# Patient Record
Sex: Female | Born: 1950 | Race: White | Hispanic: No | Marital: Married | Smoking: Former smoker
Health system: Southern US, Community
[De-identification: ages and names within clinical notes are randomized; demographics above are authoritative.]

## PROBLEM LIST (undated history)

## (undated) DIAGNOSIS — I639 Cerebral infarction, unspecified: Secondary | ICD-10-CM

## (undated) DIAGNOSIS — I10 Essential (primary) hypertension: Secondary | ICD-10-CM

## (undated) DIAGNOSIS — C801 Malignant (primary) neoplasm, unspecified: Secondary | ICD-10-CM

## (undated) DIAGNOSIS — E119 Type 2 diabetes mellitus without complications: Secondary | ICD-10-CM

## (undated) DIAGNOSIS — E785 Hyperlipidemia, unspecified: Secondary | ICD-10-CM

## (undated) DIAGNOSIS — D649 Anemia, unspecified: Secondary | ICD-10-CM

## (undated) HISTORY — DX: Type 2 diabetes mellitus without complications: E11.9

## (undated) HISTORY — DX: Anemia, unspecified: D64.9

## (undated) HISTORY — DX: Essential (primary) hypertension: I10

## (undated) HISTORY — PX: TONSILLECTOMY: SUR1361

## (undated) HISTORY — DX: Hyperlipidemia, unspecified: E78.5

## (undated) HISTORY — DX: Malignant (primary) neoplasm, unspecified: C80.1

---

## 2019-07-14 DIAGNOSIS — I779 Disorder of arteries and arterioles, unspecified: Secondary | ICD-10-CM

## 2019-07-14 HISTORY — DX: Disorder of arteries and arterioles, unspecified: I77.9

## 2019-08-12 ENCOUNTER — Other Ambulatory Visit (HOSPITAL_COMMUNITY): Payer: Medicare Other

## 2019-08-12 ENCOUNTER — Emergency Department (HOSPITAL_COMMUNITY): Payer: Medicare Other

## 2019-08-12 ENCOUNTER — Inpatient Hospital Stay (HOSPITAL_COMMUNITY): Payer: Medicare Other

## 2019-08-12 ENCOUNTER — Encounter (HOSPITAL_COMMUNITY): Payer: Medicare Other

## 2019-08-12 ENCOUNTER — Other Ambulatory Visit: Payer: Self-pay

## 2019-08-12 ENCOUNTER — Encounter (HOSPITAL_COMMUNITY): Payer: Self-pay | Admitting: Student

## 2019-08-12 ENCOUNTER — Inpatient Hospital Stay (HOSPITAL_COMMUNITY)
Admission: EM | Admit: 2019-08-12 | Discharge: 2019-08-14 | DRG: 065 | Discharge status: Against Medical Advice | Payer: Medicare Other | Attending: Family Medicine | Admitting: Family Medicine

## 2019-08-12 DIAGNOSIS — I63512 Cerebral infarction due to unspecified occlusion or stenosis of left middle cerebral artery: Secondary | ICD-10-CM | POA: Diagnosis not present

## 2019-08-12 DIAGNOSIS — Z743 Need for continuous supervision: Secondary | ICD-10-CM | POA: Diagnosis not present

## 2019-08-12 DIAGNOSIS — F17210 Nicotine dependence, cigarettes, uncomplicated: Secondary | ICD-10-CM | POA: Diagnosis present

## 2019-08-12 DIAGNOSIS — I6389 Other cerebral infarction: Secondary | ICD-10-CM | POA: Diagnosis not present

## 2019-08-12 DIAGNOSIS — G8191 Hemiplegia, unspecified affecting right dominant side: Secondary | ICD-10-CM | POA: Diagnosis not present

## 2019-08-12 DIAGNOSIS — E1149 Type 2 diabetes mellitus with other diabetic neurological complication: Secondary | ICD-10-CM

## 2019-08-12 DIAGNOSIS — R29707 NIHSS score 7: Secondary | ICD-10-CM | POA: Diagnosis not present

## 2019-08-12 DIAGNOSIS — I10 Essential (primary) hypertension: Secondary | ICD-10-CM | POA: Diagnosis not present

## 2019-08-12 DIAGNOSIS — R410 Disorientation, unspecified: Secondary | ICD-10-CM | POA: Diagnosis not present

## 2019-08-12 DIAGNOSIS — R1310 Dysphagia, unspecified: Secondary | ICD-10-CM | POA: Diagnosis present

## 2019-08-12 DIAGNOSIS — R29708 NIHSS score 8: Secondary | ICD-10-CM | POA: Diagnosis not present

## 2019-08-12 DIAGNOSIS — I63232 Cerebral infarction due to unspecified occlusion or stenosis of left carotid arteries: Secondary | ICD-10-CM | POA: Diagnosis not present

## 2019-08-12 DIAGNOSIS — I6529 Occlusion and stenosis of unspecified carotid artery: Secondary | ICD-10-CM | POA: Diagnosis not present

## 2019-08-12 DIAGNOSIS — E1159 Type 2 diabetes mellitus with other circulatory complications: Secondary | ICD-10-CM | POA: Insufficient documentation

## 2019-08-12 DIAGNOSIS — R4701 Aphasia: Secondary | ICD-10-CM | POA: Diagnosis not present

## 2019-08-12 DIAGNOSIS — R29810 Facial weakness: Secondary | ICD-10-CM | POA: Diagnosis present

## 2019-08-12 DIAGNOSIS — F17200 Nicotine dependence, unspecified, uncomplicated: Secondary | ICD-10-CM | POA: Diagnosis not present

## 2019-08-12 DIAGNOSIS — I639 Cerebral infarction, unspecified: Secondary | ICD-10-CM | POA: Insufficient documentation

## 2019-08-12 DIAGNOSIS — I6523 Occlusion and stenosis of bilateral carotid arteries: Secondary | ICD-10-CM | POA: Diagnosis not present

## 2019-08-12 DIAGNOSIS — E785 Hyperlipidemia, unspecified: Secondary | ICD-10-CM | POA: Diagnosis present

## 2019-08-12 DIAGNOSIS — R404 Transient alteration of awareness: Secondary | ICD-10-CM | POA: Diagnosis not present

## 2019-08-12 DIAGNOSIS — R29818 Other symptoms and signs involving the nervous system: Secondary | ICD-10-CM | POA: Diagnosis not present

## 2019-08-12 DIAGNOSIS — R29704 NIHSS score 4: Secondary | ICD-10-CM | POA: Diagnosis not present

## 2019-08-12 DIAGNOSIS — I779 Disorder of arteries and arterioles, unspecified: Secondary | ICD-10-CM

## 2019-08-12 DIAGNOSIS — I6522 Occlusion and stenosis of left carotid artery: Secondary | ICD-10-CM | POA: Diagnosis not present

## 2019-08-12 DIAGNOSIS — I6602 Occlusion and stenosis of left middle cerebral artery: Secondary | ICD-10-CM | POA: Diagnosis not present

## 2019-08-12 DIAGNOSIS — Q239 Congenital malformation of aortic and mitral valves, unspecified: Secondary | ICD-10-CM

## 2019-08-12 DIAGNOSIS — Z03818 Encounter for observation for suspected exposure to other biological agents ruled out: Secondary | ICD-10-CM | POA: Diagnosis not present

## 2019-08-12 DIAGNOSIS — R29709 NIHSS score 9: Secondary | ICD-10-CM | POA: Diagnosis not present

## 2019-08-12 DIAGNOSIS — E119 Type 2 diabetes mellitus without complications: Secondary | ICD-10-CM

## 2019-08-12 DIAGNOSIS — E7800 Pure hypercholesterolemia, unspecified: Secondary | ICD-10-CM

## 2019-08-12 DIAGNOSIS — R000 Tachycardia, unspecified: Secondary | ICD-10-CM | POA: Diagnosis not present

## 2019-08-12 DIAGNOSIS — R7989 Other specified abnormal findings of blood chemistry: Secondary | ICD-10-CM | POA: Diagnosis present

## 2019-08-12 DIAGNOSIS — E669 Obesity, unspecified: Secondary | ICD-10-CM | POA: Diagnosis present

## 2019-08-12 DIAGNOSIS — Z20822 Contact with and (suspected) exposure to covid-19: Secondary | ICD-10-CM | POA: Diagnosis not present

## 2019-08-12 DIAGNOSIS — D751 Secondary polycythemia: Secondary | ICD-10-CM | POA: Diagnosis not present

## 2019-08-12 DIAGNOSIS — E1165 Type 2 diabetes mellitus with hyperglycemia: Secondary | ICD-10-CM | POA: Diagnosis present

## 2019-08-12 DIAGNOSIS — I160 Hypertensive urgency: Secondary | ICD-10-CM | POA: Diagnosis not present

## 2019-08-12 DIAGNOSIS — I6621 Occlusion and stenosis of right posterior cerebral artery: Secondary | ICD-10-CM | POA: Diagnosis not present

## 2019-08-12 DIAGNOSIS — I499 Cardiac arrhythmia, unspecified: Secondary | ICD-10-CM | POA: Diagnosis not present

## 2019-08-12 DIAGNOSIS — R414 Neurologic neglect syndrome: Secondary | ICD-10-CM | POA: Diagnosis not present

## 2019-08-12 LAB — I-STAT CHEM 8, ED
BUN: 11 mg/dL (ref 8–23)
Calcium, Ion: 1.12 mmol/L — ABNORMAL LOW (ref 1.15–1.40)
Chloride: 102 mmol/L (ref 98–111)
Creatinine, Ser: 0.4 mg/dL — ABNORMAL LOW (ref 0.44–1.00)
Glucose, Bld: 204 mg/dL — ABNORMAL HIGH (ref 70–99)
HCT: 53 % — ABNORMAL HIGH (ref 36.0–46.0)
Hemoglobin: 18 g/dL — ABNORMAL HIGH (ref 12.0–15.0)
Potassium: 4 mmol/L (ref 3.5–5.1)
Sodium: 137 mmol/L (ref 135–145)
TCO2: 21 mmol/L — ABNORMAL LOW (ref 22–32)

## 2019-08-12 LAB — COMPREHENSIVE METABOLIC PANEL
ALT: 28 U/L (ref 0–44)
AST: 26 U/L (ref 15–41)
Albumin: 4.3 g/dL (ref 3.5–5.0)
Alkaline Phosphatase: 80 U/L (ref 38–126)
Anion gap: 16 — ABNORMAL HIGH (ref 5–15)
BUN: 10 mg/dL (ref 8–23)
CO2: 20 mmol/L — ABNORMAL LOW (ref 22–32)
Calcium: 9.9 mg/dL (ref 8.9–10.3)
Chloride: 102 mmol/L (ref 98–111)
Creatinine, Ser: 0.65 mg/dL (ref 0.44–1.00)
GFR calc Af Amer: >60 mL/min (ref >60)
GFR calc non Af Amer: >60 mL/min (ref >60)
Glucose, Bld: 209 mg/dL — ABNORMAL HIGH (ref 70–99)
Potassium: 4.3 mmol/L (ref 3.5–5.1)
Sodium: 138 mmol/L (ref 135–145)
Total Bilirubin: 0.9 mg/dL (ref 0.3–1.2)
Total Protein: 7.1 g/dL (ref 6.5–8.1)

## 2019-08-12 LAB — APTT: aPTT: 31 seconds (ref 24–36)

## 2019-08-12 LAB — CBG MONITORING, ED: Glucose-Capillary: 209 mg/dL — ABNORMAL HIGH (ref 70–99)

## 2019-08-12 LAB — DIFFERENTIAL
Abs Immature Granulocytes: 0.03 10*3/uL (ref 0.00–0.07)
Basophils Absolute: 0 10*3/uL (ref 0.0–0.1)
Basophils Relative: 0 %
Eosinophils Absolute: 0.1 10*3/uL (ref 0.0–0.5)
Eosinophils Relative: 1 %
Immature Granulocytes: 0 %
Lymphocytes Relative: 18 %
Lymphs Abs: 2 10*3/uL (ref 0.7–4.0)
Monocytes Absolute: 0.8 10*3/uL (ref 0.1–1.0)
Monocytes Relative: 8 %
Neutro Abs: 7.7 10*3/uL (ref 1.7–7.7)
Neutrophils Relative %: 73 %

## 2019-08-12 LAB — CBC
HCT: 53.8 % — ABNORMAL HIGH (ref 36.0–46.0)
Hemoglobin: 17.8 g/dL — ABNORMAL HIGH (ref 12.0–15.0)
MCH: 29.4 pg (ref 26.0–34.0)
MCHC: 33.1 g/dL (ref 30.0–36.0)
MCV: 88.9 fL (ref 80.0–100.0)
Platelets: 221 10*3/uL (ref 150–400)
RBC: 6.05 MIL/uL — ABNORMAL HIGH (ref 3.87–5.11)
RDW: 14.4 % (ref 11.5–15.5)
WBC: 10.6 10*3/uL — ABNORMAL HIGH (ref 4.0–10.5)
nRBC: 0 % (ref 0.0–0.2)

## 2019-08-12 LAB — PROTIME-INR
INR: 1 (ref 0.8–1.2)
Prothrombin Time: 13.1 seconds (ref 11.4–15.2)

## 2019-08-12 LAB — SARS CORONAVIRUS 2 BY RT PCR (HOSPITAL ORDER, PERFORMED IN ~~LOC~~ HOSPITAL LAB): SARS Coronavirus 2: NEGATIVE

## 2019-08-12 LAB — HIV ANTIBODY (ROUTINE TESTING W REFLEX): HIV Screen 4th Generation wRfx: NONREACTIVE

## 2019-08-12 IMAGING — CT CT HEAD CODE STROKE
4 series · 15 of 47 positions shown, 17 images · non-contrast
Comparison: None.

CLINICAL DATA: Code stroke.  Acute confusion

EXAM:
CT HEAD WITHOUT CONTRAST
TECHNIQUE: Contiguous axial images were obtained from the base of the skull
through the vertex without intravenous contrast.

[Series 3: head 5.0 st · axial · 0.44mm/px · z∈[-96,+29]mm · 7 of 35 slices shown, 9 images]
[im 5/35  brain]
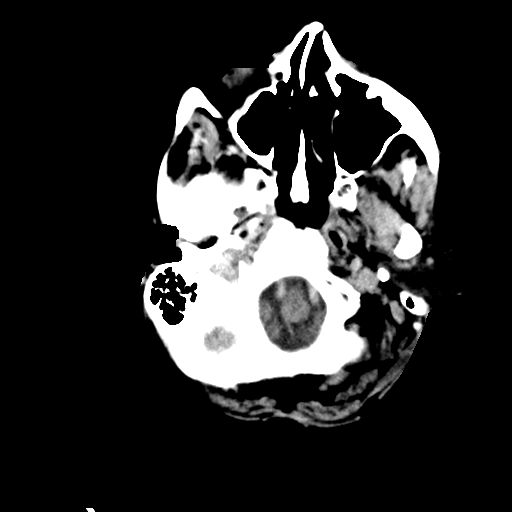
[im 5/35  bone]
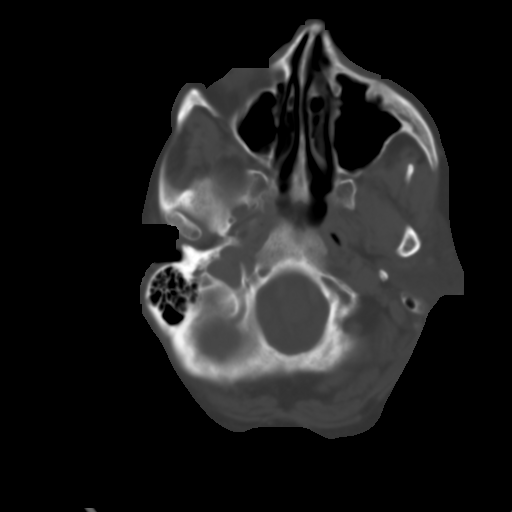
[im 9/35  brain]
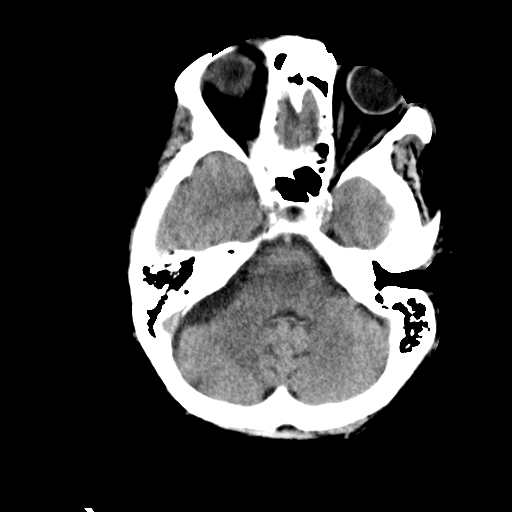
[im 13/35  brain]
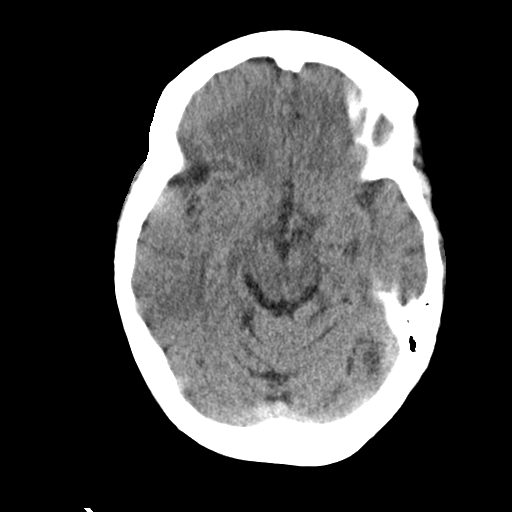
[im 18/35  brain]
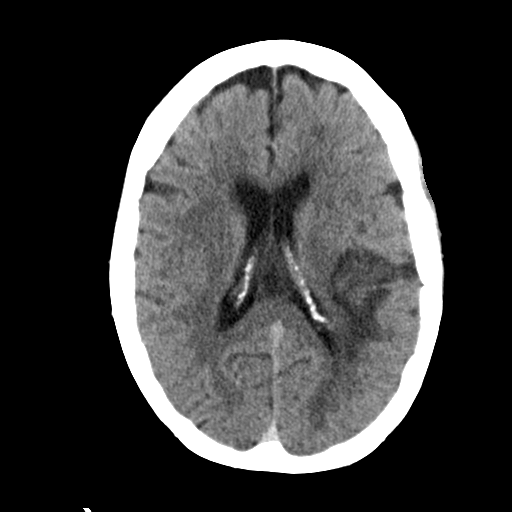
[im 22/35  brain]
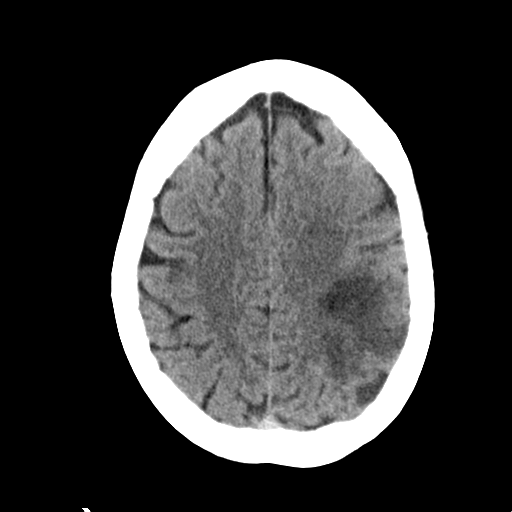
[im 22/35  bone]
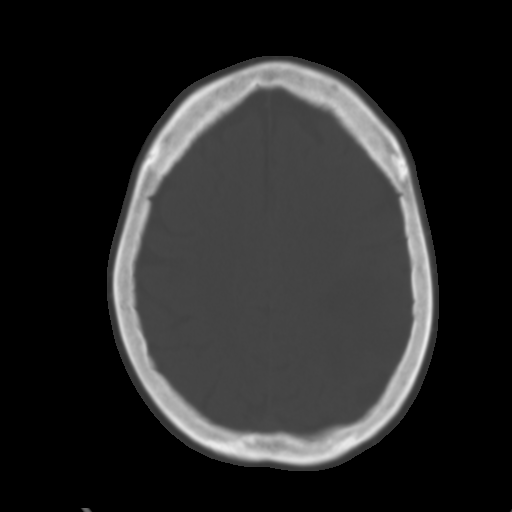
[im 26/35  brain]
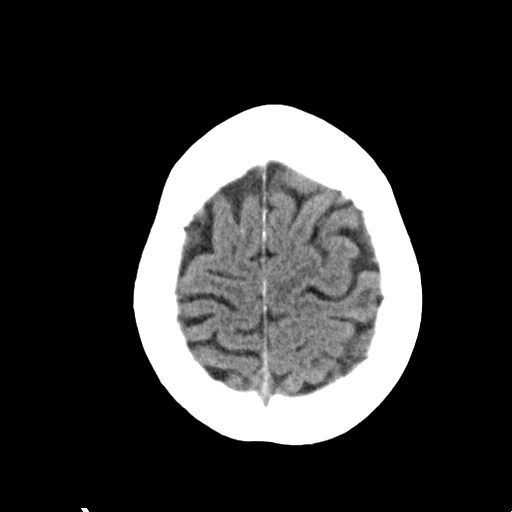
[im 30/35  brain]
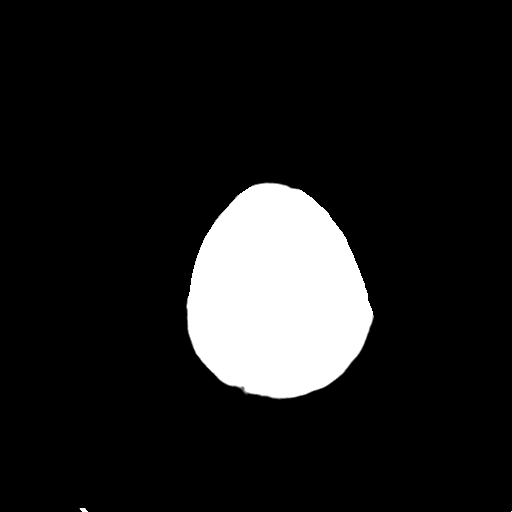

[Series 4: head 2.0 bone · axial · 0.44mm/px · z∈[-100,-82]mm · 2 of 87 slices shown]
[im 9/87  bone]
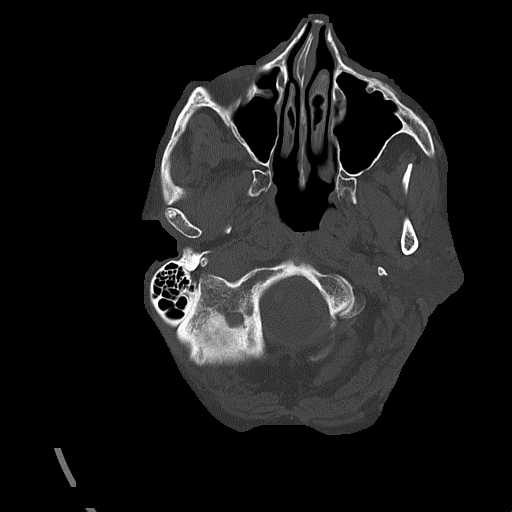
[im 18/87  bone]
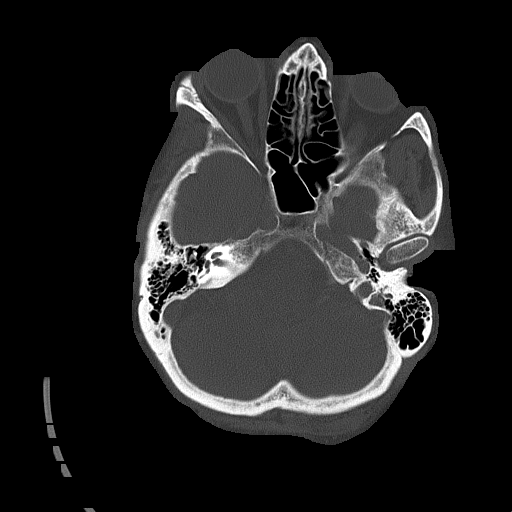

[Series 5: head 3.0 sag st · sagittal · 0.23mm/px · 3 of 53 slices shown]
[im 18/53  brain]
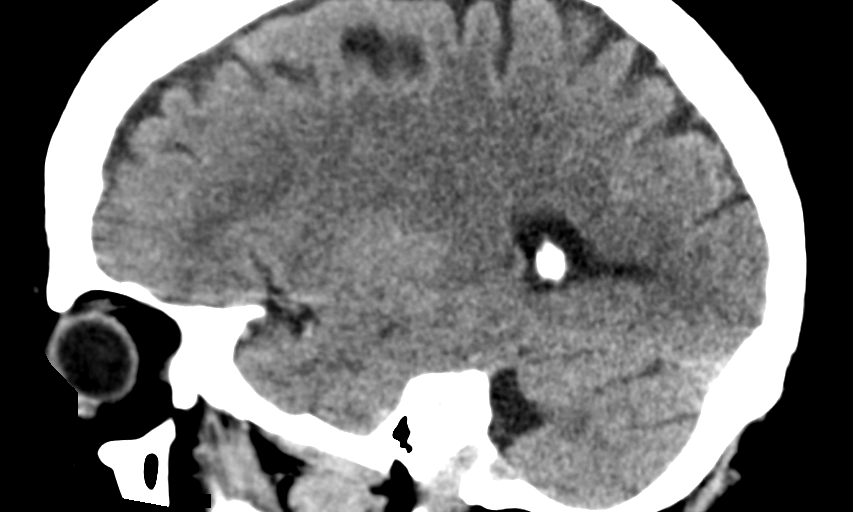
[im 27/53  brain]
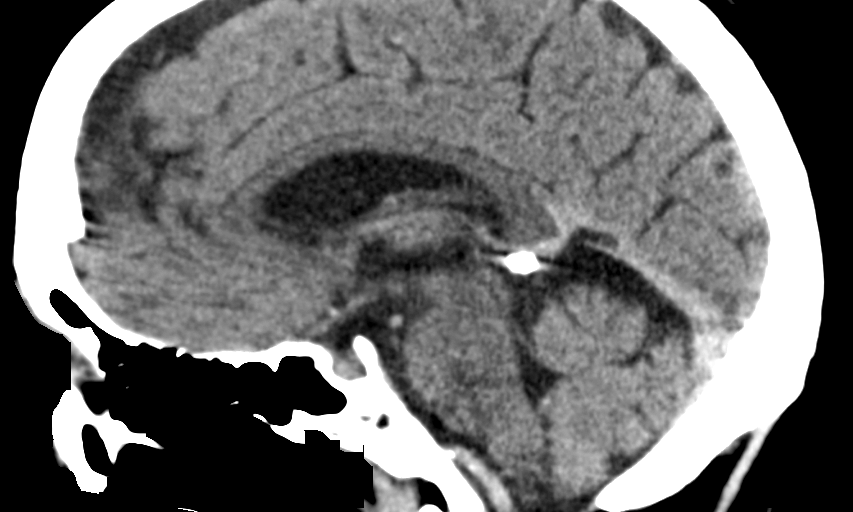
[im 35/53  brain]
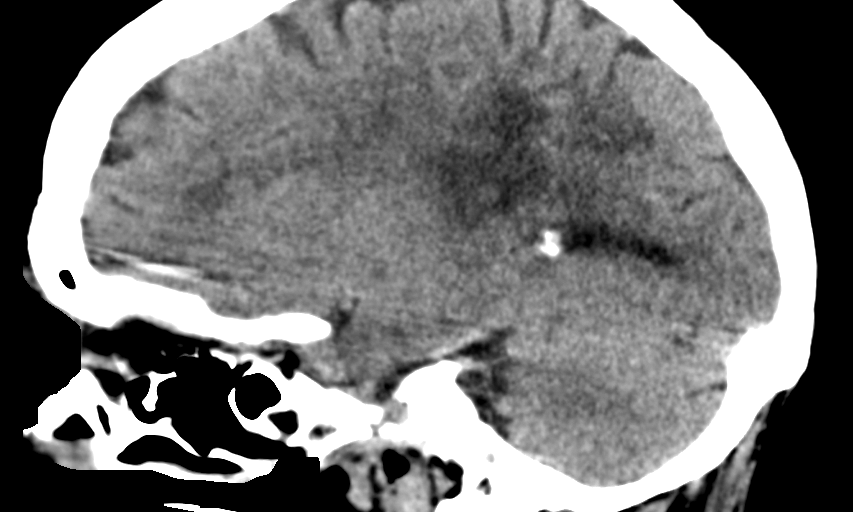

[Series 6: head 3.0 cor st · coronal · 0.34mm/px · 3 of 67 slices shown]
[im 23/67  brain]
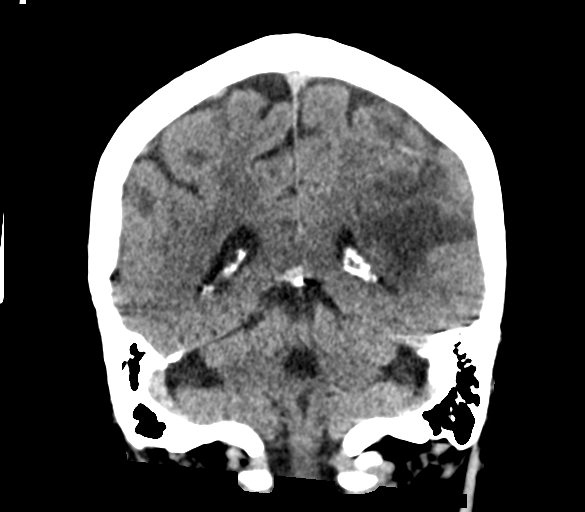
[im 30/67  brain]
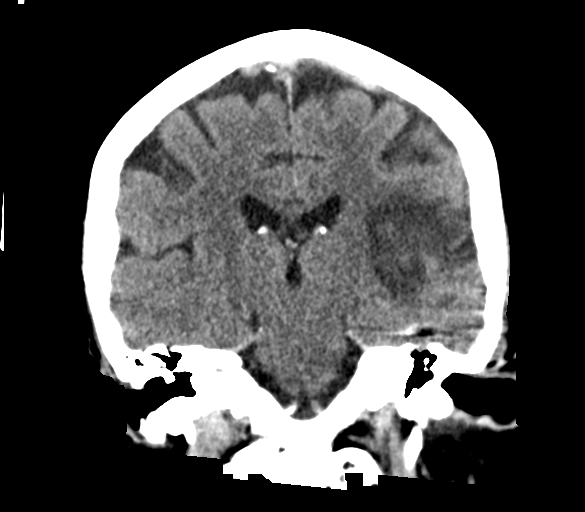
[im 37/67  brain]
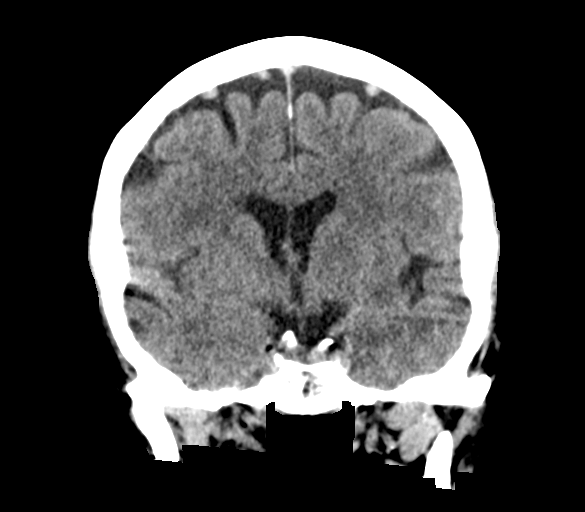

[15 of 47 positions shown; findings below may reference images not displayed]

FINDINGS: Brain: Large area of cytotoxic edema in the left MCA territory
affecting the upper division, extending from insula to frontal
parietal cortex and involving the posterior left putamen. Accounting
for motion and streak artifact, no convincing acute infarct in the
right cerebrum, blurring at the right frontal operculum could be
explained by these factors. Especially on reformats there could be
lateral left frontal infarct that is more subtle and acute. No
hematoma, hydrocephalus, or collection.

Vascular: Atherosclerotic calcification.  No hyperdense vessel

Skull: Negative

Sinuses/Orbits: Negative

Other: These results were communicated to Dr. JAUREGUI at [DATE] JAUREGUI
[DATE]by text page via the [employer] messaging system.

ASPECTS ([place] Stroke Program Early CT Score)

- Ganglionic level infarction (caudate, lentiform nuclei, internal
capsule, insula, M1-M3 cortex): 5

- Supraganglionic infarction (M4-M6 cortex): 1

Total score (0-10 with 10 being normal): 6
IMPRESSION: Subacute appearing infarct in the left superior division MCA
territory. There may be more acute infarct in the anterolateral left
frontal lobe. ASPECTS is 6 at best.

## 2019-08-12 IMAGING — MR MR HEAD WO/W CM
10 of 13 series · 34 of 48 positions shown · IV contrast (Yes   MULTIHANCE)
Comparison: CT a head and neck [DATE]

CLINICAL DATA: Stroke.

EXAM:
MRI HEAD WITHOUT AND WITH CONTRAST
TECHNIQUE: Multiplanar, multiecho pulse sequences of the brain and surrounding
structures were obtained without and with intravenous contrast.
CONTRAST:  9mL GADAVIST GADOBUTROL 1 MMOL/ML IV SOLN

[Series 4: DWI · axial · 3.0mm · 1.09mm/px · z∈[-106,+29]mm · 8 of 92 slices shown (1 of 4)]
[im 1/92]
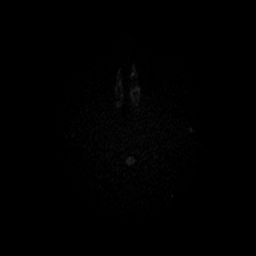
[im 14/92]
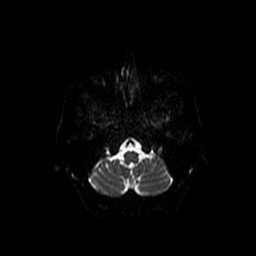
[im 27/92]
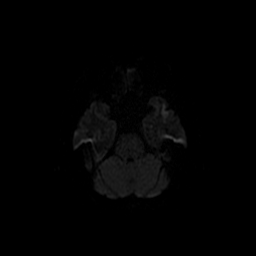
[im 40/92]
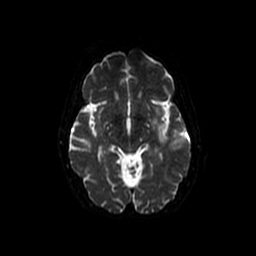
[im 53/92]
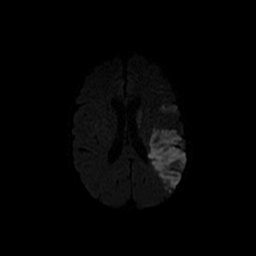
[im 66/92]
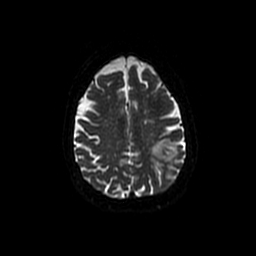
[im 79/92]
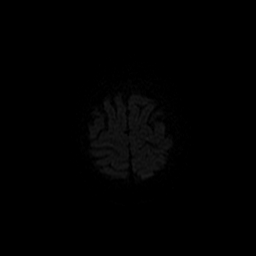
[im 92/92]
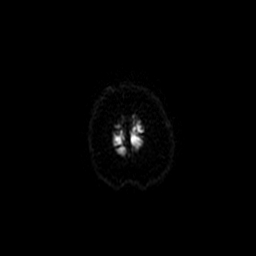

[Series 5: DWI · coronal · 5.0mm · 1.09mm/px · 6 of 68 slices shown (2 of 4)]
[im 1/68]
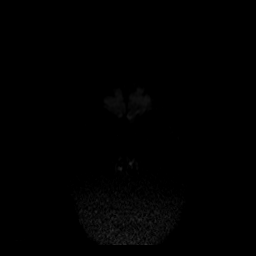
[im 14/68]
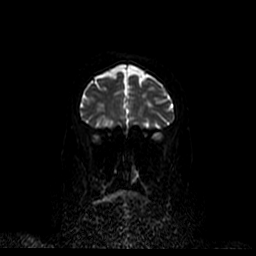
[im 27/68]
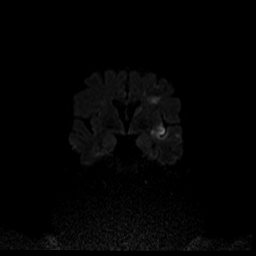
[im 41/68]
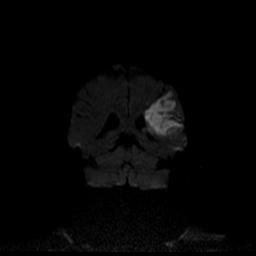
[im 54/68]
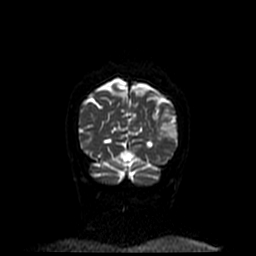
[im 68/68]
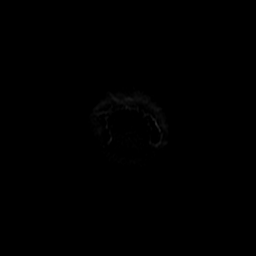

[Series 7: T2 · axial · 5.0mm · 0.43mm/px · z∈[-113,+21]mm · 2 of 24 slices shown]
[im 1/24]
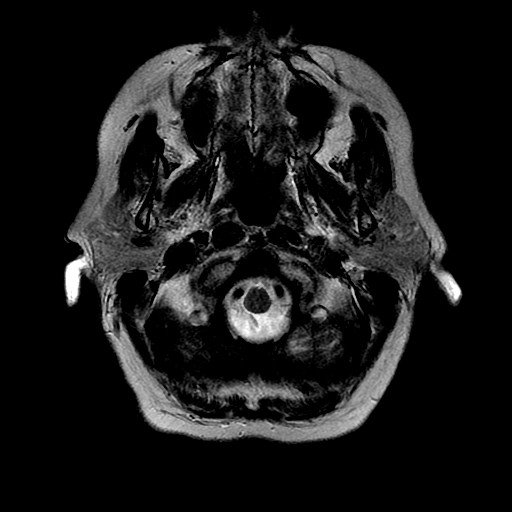
[im 24/24]
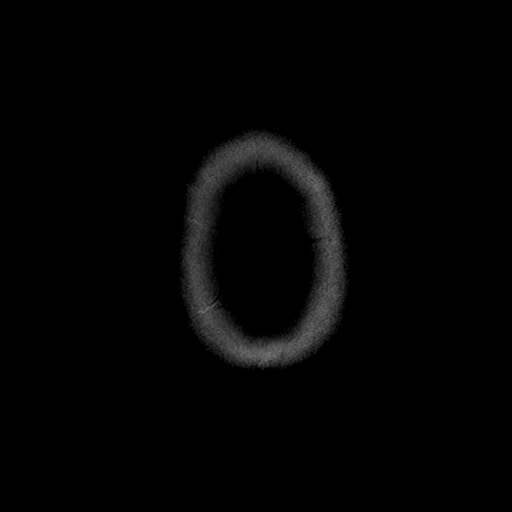

[Series 8: FLAIR · axial · 5.0mm · 0.43mm/px · z∈[-113,+21]mm · 2 of 24 slices shown]
[im 1/24]
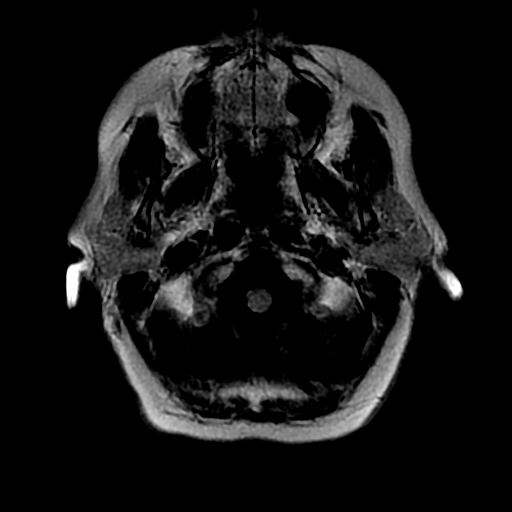
[im 24/24]
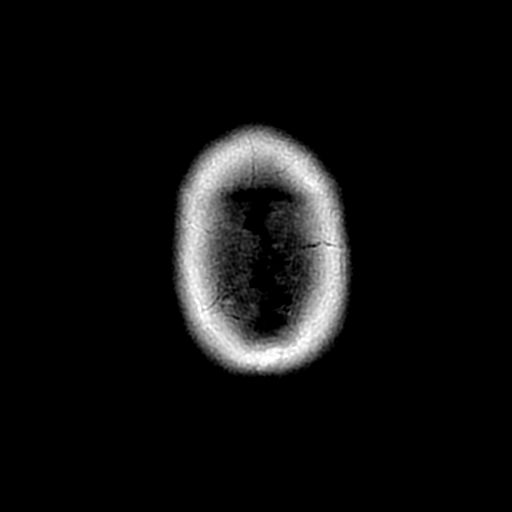

[Series 11: T2 post-contrast · coronal · 5.0mm · 0.39mm/px · 1 of 27 slices shown]
[im 1/27]
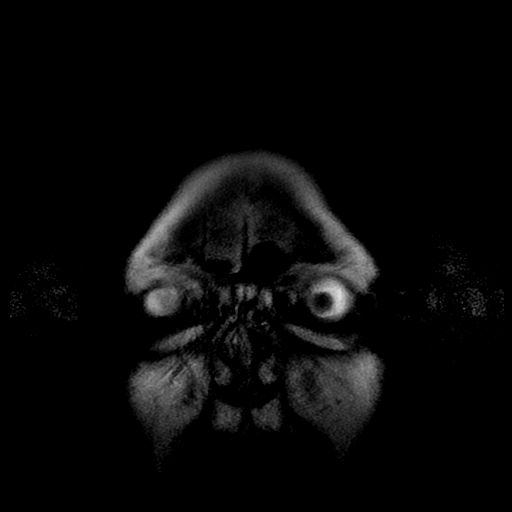

[Series 12: T1 post-contrast · axial · 3.0mm · 0.47mm/px · z∈[-120,+23]mm · 4 of 50 slices shown (1 of 3)]
[im 1/50]
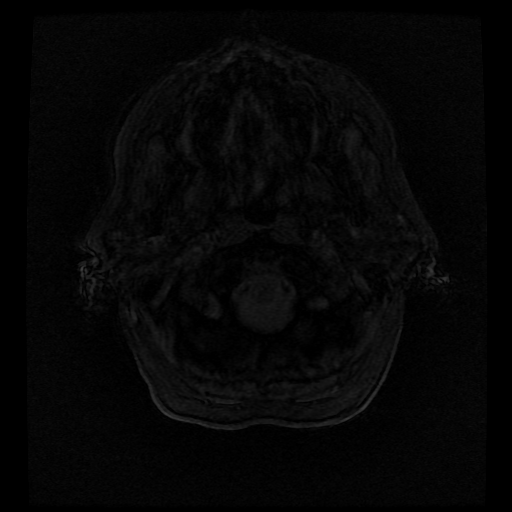
[im 17/50]
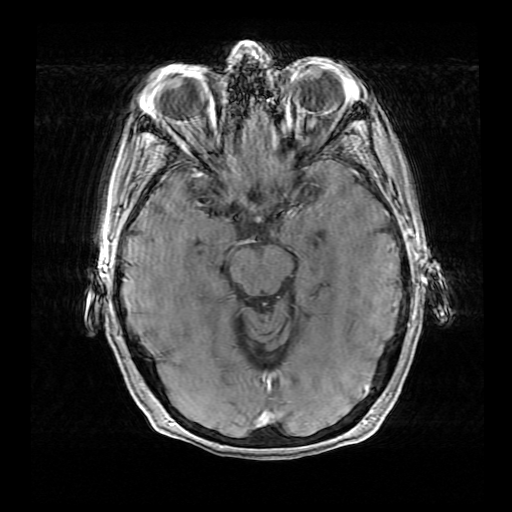
[im 33/50]
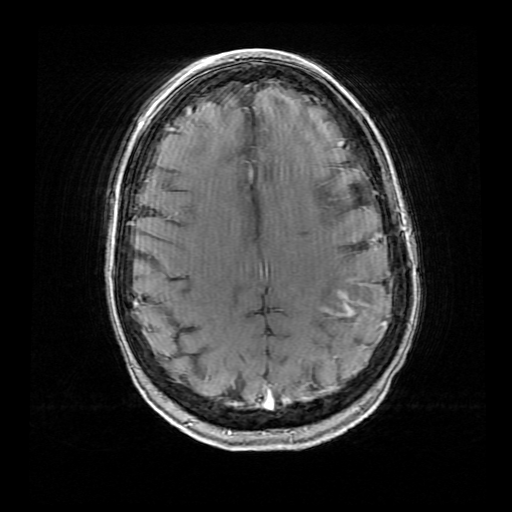
[im 50/50]
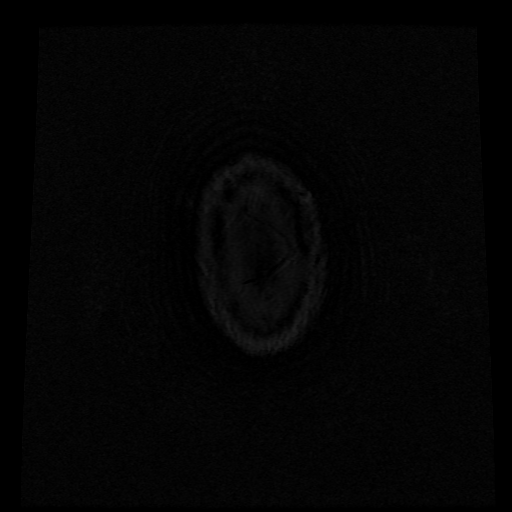

[Series 13: T1 post-contrast · coronal · 5.0mm · 0.39mm/px · 2 of 27 slices shown (2 of 3)]
[im 1/27]
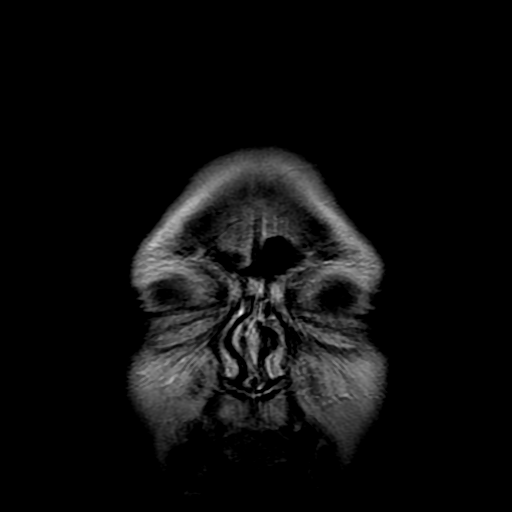
[im 27/27]
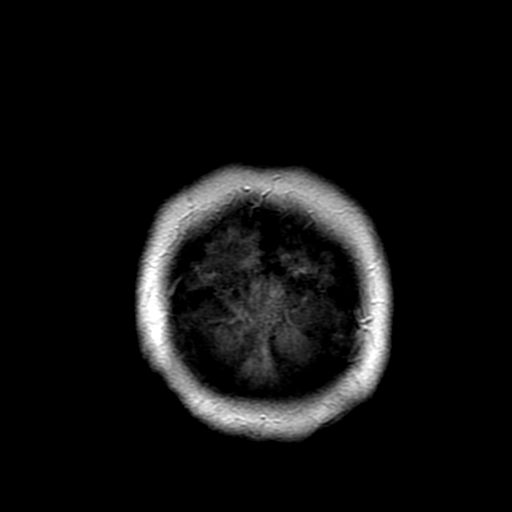

[Series 14: T1 post-contrast · sagittal · 5.0mm · 0.47mm/px · 2 of 23 slices shown (3 of 3)]
[im 1/23]
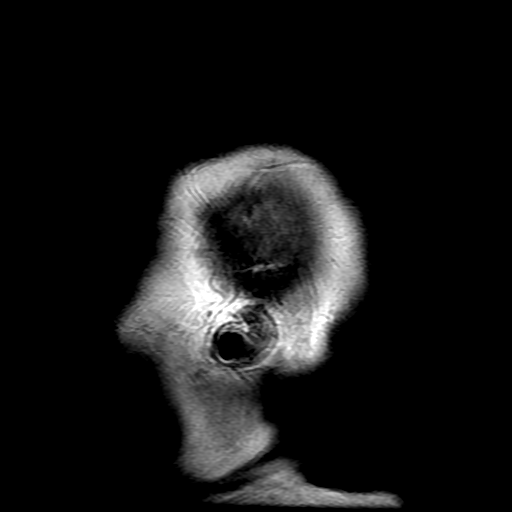
[im 23/23]
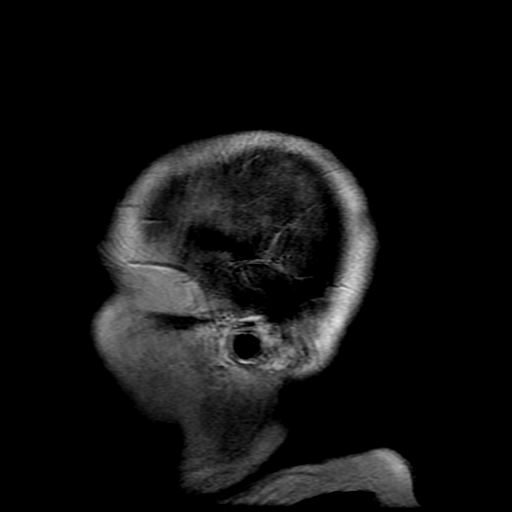

[Series 400: DWI · axial · 3.0mm · 1.09mm/px · z∈[-106,+29]mm · 4 of 46 slices shown (3 of 4)]
[im 1/46]
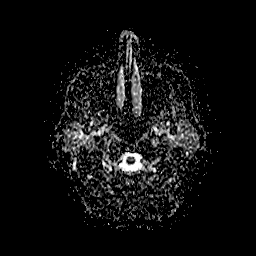
[im 16/46]
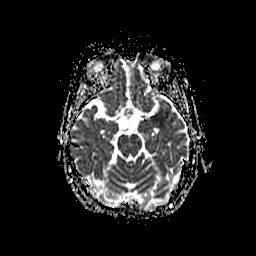
[im 31/46]
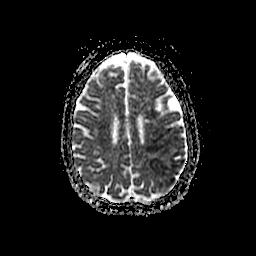
[im 46/46]
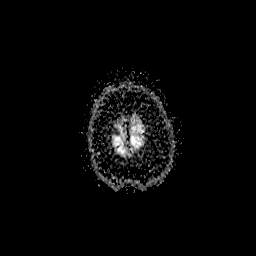

[Series 500: DWI · coronal · 5.0mm · 1.09mm/px · 3 of 34 slices shown (4 of 4)]
[im 1/34]
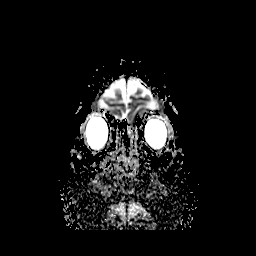
[im 17/34]
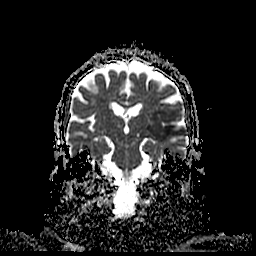
[im 34/34]
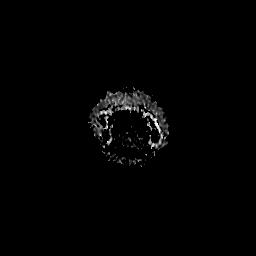

[34 of 48 positions shown; findings below may reference images not displayed]

FINDINGS: Brain: Acute infarct left MCA territory. This involves the posterior
insula extending into the left frontal parietal lobe involving
cortex and white matter. Small additional areas of acute infarct in
the left middle frontal lobe. Multiple small areas of acute infarct
in the left caudate. Negative for hemorrhage. The infarct shows mild
cortical enhancement suggesting subacute duration.

Ventricle size normal. Mild chronic ischemic changes in the white
matter. Negative for mass lesion.

Vascular: Normal arterial flow voids

Skull and upper cervical spine: Negative

Sinuses/Orbits: Paranasal sinuses clear.  Negative orbit.

Other: None
IMPRESSION: Acute infarct left MCA territory as described above. The infarct
enhances suggesting subacute duration. No associated hemorrhage.

## 2019-08-12 IMAGING — CT CT ANGIO NECK
3 of 7 series · 10 of 36 positions shown · IV contrast (OMNI 350)
Comparison: Noncontrast head CT performed earlier the same day
[DATE]
COMPARISON: Noncontrast head CT performed earlier the same day
[DATE]

Addendum:
CLINICAL DATA: Neuro deficit, acute, stroke suspected. Additional
history provided: Confusion.

EXAM:
CT ANGIOGRAPHY HEAD AND NECK
TECHNIQUE: Multidetector CT imaging of the head and neck was performed using
the standard protocol during bolus administration of intravenous
contrast. Multiplanar CT image reconstructions and MIPs were
obtained to evaluate the vascular anatomy. Carotid stenosis
measurements (when applicable) are obtained utilizing NASCET
criteria, using the distal internal carotid diameter as the
denominator.
CONTRAST:  75mL OMNIPAQUE IOHEXOL 350 MG/ML SOLN

[Series 5: cta neck · axial · 0.42mm/px · z∈[-269,-143]mm · 2 of 191 slices shown]
[im 64/191  soft-tissue]
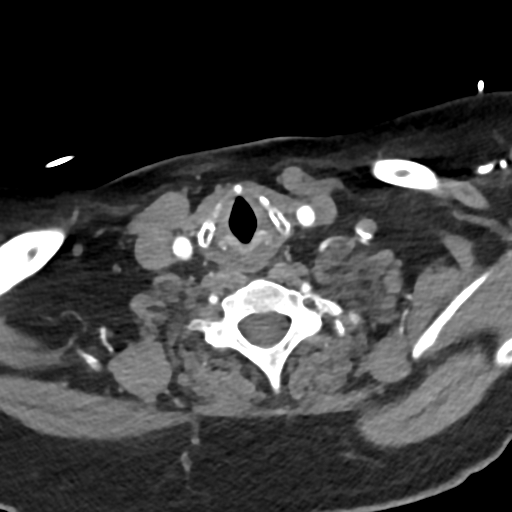
[im 127/191  bone]
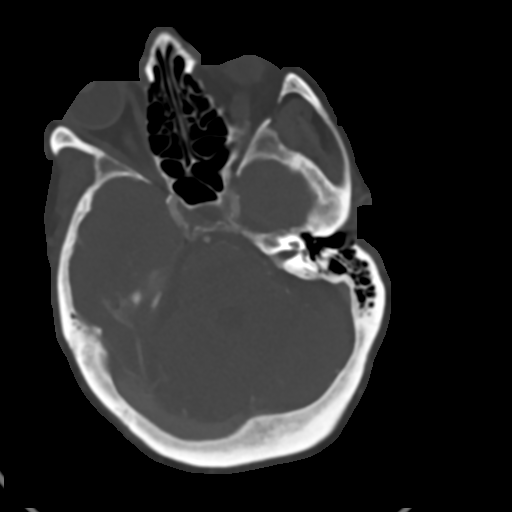

[Series 7: cta neck axial · axial · 0.39mm/px · z∈[-353,-84]mm · 6 of 380 slices shown]
[im 55/380  soft-tissue]
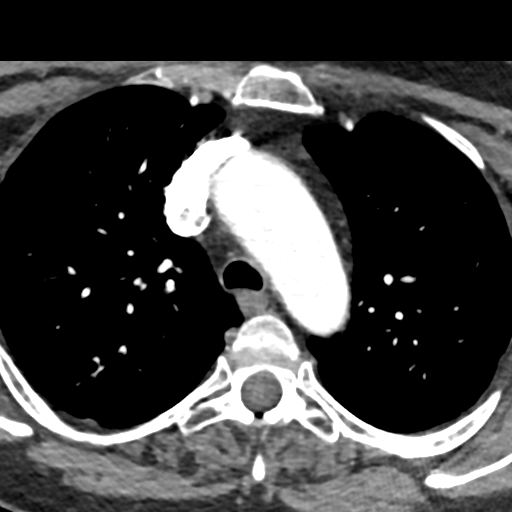
[im 109/380  soft-tissue]
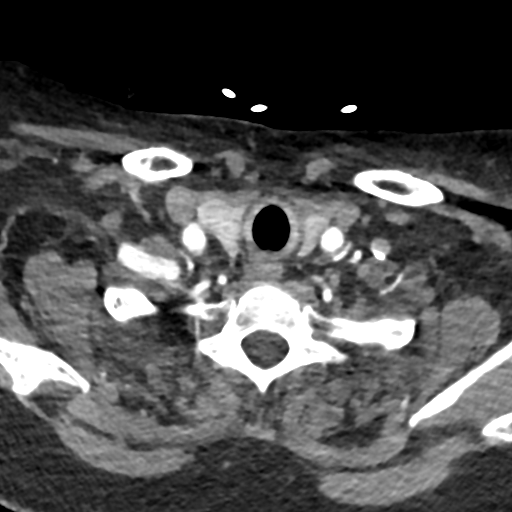
[im 163/380  soft-tissue]
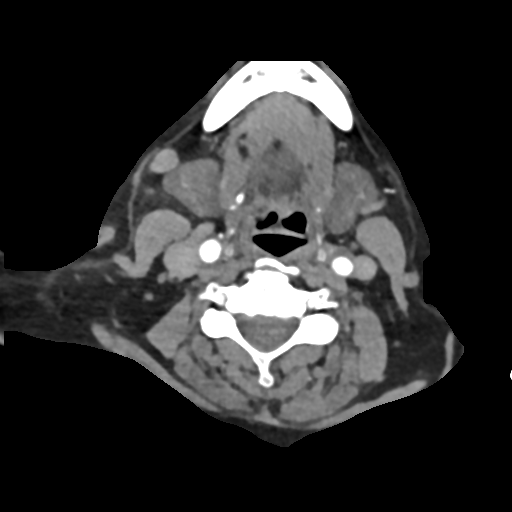
[im 217/380  soft-tissue]
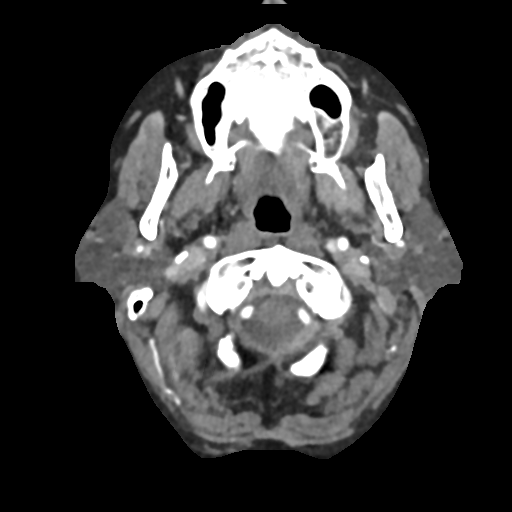
[im 271/380  soft-tissue]
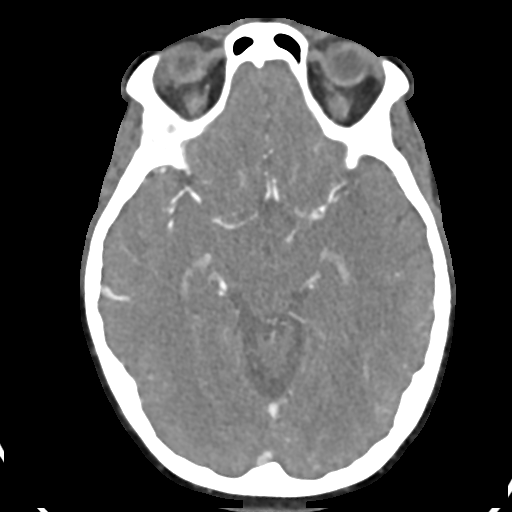
[im 325/380  soft-tissue]
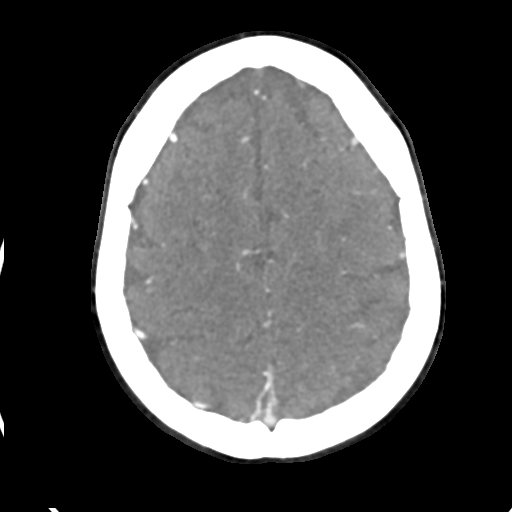

[Series 9: cta neck sagittal · sagittal · 0.50mm/px · 2 of 201 slices shown]
[im 50/201  soft-tissue]
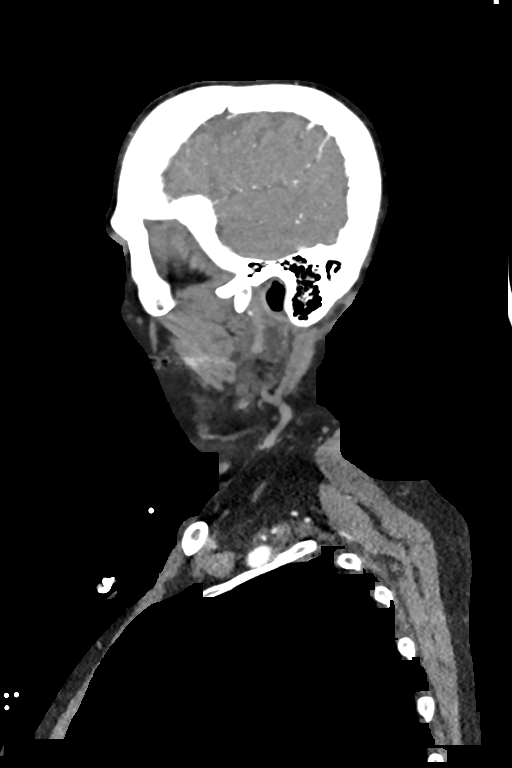
[im 152/201  soft-tissue]
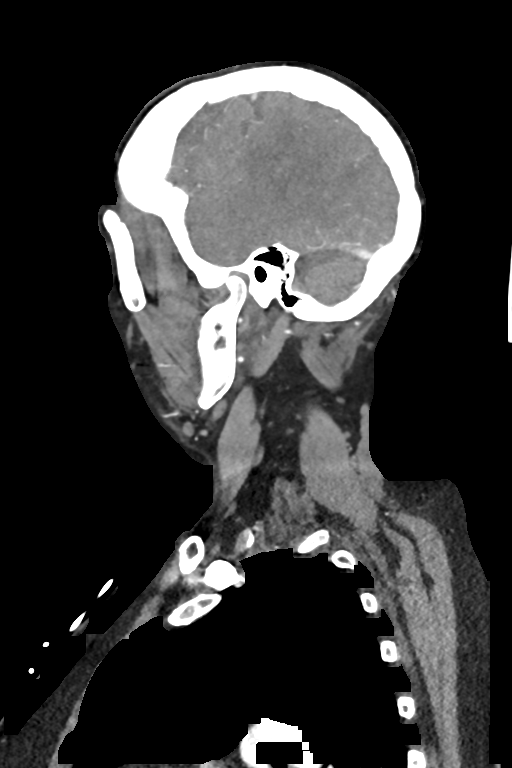

[10 of 36 positions shown; findings below may reference images not displayed]

FINDINGS: CTA NECK FINDINGS

Aortic arch: Standard aortic branching. Atherosclerotic plaque
within the visualized aortic arch and proximal major branch vessels
of the neck. No hemodynamically significant innominate or proximal
subclavian artery stenosis.

Right carotid system: CCA and ICA patent within the neck without
significant stenosis (50% or greater). Mild to moderate calcified
plaque within the carotid bifurcation and proximal ICA.

Left carotid system: CCA and ICA patent within the neck. Prominent
mixed plaque within the carotid bifurcation and proximal ICA. There
is resultant high-grade, likely near-occlusive stenosis at the
origin of the ICA. Distal to this, the ICA is patent within the neck
without significant stenosis

Vertebral arteries: The vertebral arteries are codominant and patent
within the neck without significant stenosis.

Skeleton: No acute bony abnormality or aggressive osseous lesion.
Cervical spondylosis with multilevel disc space narrowing, posterior
disc osteophytes, uncovertebral and facet hypertrophy. Congenital
fusion of the C6-C7 vertebrae.

Other neck: No neck mass or cervical lymphadenopathy.

Upper chest: No consolidation within the imaged lung apices.

Review of the MIP images confirms the above findings

CTA HEAD FINDINGS

Anterior circulation:

The intracranial internal carotid arteries are patent. Calcified
plaque within both vessels. Mild stenosis of the
cavernous/paraclinoid right ICA. There is up to moderate stenosis
within the paraclinoid left ICA.

The M1 right middle cerebral artery is patent without significant
stenosis. There is a severe focal stenosis within a proximal to mid
superior division right M2 MCA branch vessel (series 12, image 14).
Additional moderate/severe stenosis within a proximal to mid
superior division right M2 MCA branch vessel (series 12, image 15).

The M1 middle cerebral artery is patent with sites of up to moderate
stenosis. There is a paucity of flow within M2 and more distal left
MCA branch vessels with suspected age-indeterminate occlusion of
superior and inferior division proximal M2 MCA branch vessels. There
is a patent M2 superior division vessel which has a site of moderate
to severe stenosis in its proximal segment (series 12, image 28).

The anterior cerebral arteries are patent. There is a moderate focal
stenosis within the distal A2 right anterior cerebral artery (series
12, image 20).

No intracranial aneurysm is identified.

Posterior circulation:

The intracranial vertebral arteries are patent. Minimal calcified
plaque within the V4 right vertebral artery. The basilar artery is
patent with sites of mild atherosclerotic narrowing. Predominantly
fetal origin of the left posterior cerebral artery. Posterior
cerebral arteries are patent bilaterally. There is a high-grade
focal stenosis within the right PCA at the P2/P3 junction (series
10, image 20). There is a moderate/severe stenosis within the left
PCA at the P2/P3 junction (series 12, image 24). The right posterior
communicating artery is hypoplastic or absent.

Venous sinuses: Within limitations of contrast timing, no convincing
thrombus.

Anatomic variants: As described

Review of the MIP images confirms the above findings
IMPRESSION: CTA neck:

1. Prominent mixed plaque within the left carotid bifurcation and
proximal left ICA. There is resultant high-grade, likely
near-occlusive, stenosis at the origin of the left ICA. Carotid
artery duplex is recommended to ensure some flow at this site prior
to any intervention.
2. The right common carotid, right internal carotid and bilateral
vertebral arteries are patent within the neck without
hemodynamically significant stenosis. Mild to moderate calcified
plaque within the right carotid bifurcation and proximal ICA.

CTA head:

1. Paucity of flow within M2 and more distal left MCA branch vessels
with suspected age-indeterminate occlusion of superior and inferior
division proximal M2 MCA branches. There is a patent M2 superior
division left MCA vessel which has a site of moderate to severe
stenosis in its proximal segment (series 12, image 28).
2. Sites of moderate stenosis within the M1 left middle cerebral
artery.
3. Severe focal stenosis within a proximal to mid superior division
right M2 MCA branch vessel.
4. Moderate/severe focal stenosis within a separate proximal to mid
superior division right M2 MCA branch vessel.
5. Moderate focal stenosis within the distal A2 right anterior
cerebral artery.
6. High-grade focal stenosis within the P2/P3 right PCA.
7. Moderate/severe focal stenosis within the P2/P3 left PCA.

ADDENDUM:
These results were called by telephone at the time of interpretation
on [DATE] at [DATE] to provider EMMANUEL EKOW , who verbally
acknowledged these results.

*** End of Addendum ***
FINDINGS: CTA NECK FINDINGS

Aortic arch: Standard aortic branching. Atherosclerotic plaque
within the visualized aortic arch and proximal major branch vessels
of the neck. No hemodynamically significant innominate or proximal
subclavian artery stenosis.

Right carotid system: CCA and ICA patent within the neck without
significant stenosis (50% or greater). Mild to moderate calcified
plaque within the carotid bifurcation and proximal ICA.

Left carotid system: CCA and ICA patent within the neck. Prominent
mixed plaque within the carotid bifurcation and proximal ICA. There
is resultant high-grade, likely near-occlusive stenosis at the
origin of the ICA. Distal to this, the ICA is patent within the neck
without significant stenosis

Vertebral arteries: The vertebral arteries are codominant and patent
within the neck without significant stenosis.

Skeleton: No acute bony abnormality or aggressive osseous lesion.
Cervical spondylosis with multilevel disc space narrowing, posterior
disc osteophytes, uncovertebral and facet hypertrophy. Congenital
fusion of the C6-C7 vertebrae.

Other neck: No neck mass or cervical lymphadenopathy.

Upper chest: No consolidation within the imaged lung apices.

Review of the MIP images confirms the above findings

CTA HEAD FINDINGS

Anterior circulation:

The intracranial internal carotid arteries are patent. Calcified
plaque within both vessels. Mild stenosis of the
cavernous/paraclinoid right ICA. There is up to moderate stenosis
within the paraclinoid left ICA.

The M1 right middle cerebral artery is patent without significant
stenosis. There is a severe focal stenosis within a proximal to mid
superior division right M2 MCA branch vessel (series 12, image 14).
Additional moderate/severe stenosis within a proximal to mid
superior division right M2 MCA branch vessel (series 12, image 15).

The M1 middle cerebral artery is patent with sites of up to moderate
stenosis. There is a paucity of flow within M2 and more distal left
MCA branch vessels with suspected age-indeterminate occlusion of
superior and inferior division proximal M2 MCA branch vessels. There
is a patent M2 superior division vessel which has a site of moderate
to severe stenosis in its proximal segment (series 12, image 28).

The anterior cerebral arteries are patent. There is a moderate focal
stenosis within the distal A2 right anterior cerebral artery (series
12, image 20).

No intracranial aneurysm is identified.

Posterior circulation:

The intracranial vertebral arteries are patent. Minimal calcified
plaque within the V4 right vertebral artery. The basilar artery is
patent with sites of mild atherosclerotic narrowing. Predominantly
fetal origin of the left posterior cerebral artery. Posterior
cerebral arteries are patent bilaterally. There is a high-grade
focal stenosis within the right PCA at the P2/P3 junction (series
10, image 20). There is a moderate/severe stenosis within the left
PCA at the P2/P3 junction (series 12, image 24). The right posterior
communicating artery is hypoplastic or absent.

Venous sinuses: Within limitations of contrast timing, no convincing
thrombus.

Anatomic variants: As described

Review of the MIP images confirms the above findings
IMPRESSION: CTA neck:

1. Prominent mixed plaque within the left carotid bifurcation and
proximal left ICA. There is resultant high-grade, likely
near-occlusive, stenosis at the origin of the left ICA. Carotid
artery duplex is recommended to ensure some flow at this site prior
to any intervention.
2. The right common carotid, right internal carotid and bilateral
vertebral arteries are patent within the neck without
hemodynamically significant stenosis. Mild to moderate calcified
plaque within the right carotid bifurcation and proximal ICA.

CTA head:

1. Paucity of flow within M2 and more distal left MCA branch vessels
with suspected age-indeterminate occlusion of superior and inferior
division proximal M2 MCA branches. There is a patent M2 superior
division left MCA vessel which has a site of moderate to severe
stenosis in its proximal segment (series 12, image 28).
2. Sites of moderate stenosis within the M1 left middle cerebral
artery.
3. Severe focal stenosis within a proximal to mid superior division
right M2 MCA branch vessel.
4. Moderate/severe focal stenosis within a separate proximal to mid
superior division right M2 MCA branch vessel.
5. Moderate focal stenosis within the distal A2 right anterior
cerebral artery.
6. High-grade focal stenosis within the P2/P3 right PCA.
7. Moderate/severe focal stenosis within the P2/P3 left PCA.

## 2019-08-12 MED ORDER — SODIUM CHLORIDE 0.9% FLUSH
3.0000 mL | Freq: Once | INTRAVENOUS | Status: AC
  Administered 2019-08-12: 3 mL via INTRAVENOUS

## 2019-08-12 MED ORDER — ACETAMINOPHEN 650 MG RE SUPP: 650.0000 mg | RECTAL | Status: DC | PRN

## 2019-08-12 MED ORDER — GADOBUTROL 1 MMOL/ML IV SOLN
9.0000 mL | Freq: Once | INTRAVENOUS | Status: AC | PRN
  Administered 2019-08-12: 9 mL via INTRAVENOUS

## 2019-08-12 MED ORDER — SENNOSIDES-DOCUSATE SODIUM 8.6-50 MG PO TABS: 1.0000 | ORAL_TABLET | Freq: Every evening | ORAL | Status: DC | PRN

## 2019-08-12 MED ORDER — ACETAMINOPHEN 160 MG/5ML PO SOLN: 650.0000 mg | ORAL | Status: DC | PRN

## 2019-08-12 MED ORDER — LABETALOL HCL 5 MG/ML IV SOLN: 10.0000 mg | Freq: Once | INTRAVENOUS | Status: DC

## 2019-08-12 MED ORDER — ASPIRIN 81 MG PO CHEW: 81.0000 mg | CHEWABLE_TABLET | Freq: Every day | ORAL | Status: DC

## 2019-08-12 MED ORDER — STROKE: EARLY STAGES OF RECOVERY BOOK
Freq: Once | Status: DC
  Filled 2019-08-12: qty 1

## 2019-08-12 MED ORDER — ENOXAPARIN SODIUM 40 MG/0.4ML ~~LOC~~ SOLN
40.0000 mg | SUBCUTANEOUS | Status: DC
  Administered 2019-08-12 – 2019-08-13 (×2): 40 mg via SUBCUTANEOUS
  Filled 2019-08-12 (×2): qty 0.4

## 2019-08-12 MED ORDER — LOSARTAN POTASSIUM 25 MG PO TABS
25.0000 mg | ORAL_TABLET | Freq: Every day | ORAL | Status: DC
  Administered 2019-08-12 – 2019-08-14 (×3): 25 mg via ORAL
  Filled 2019-08-12 (×3): qty 1

## 2019-08-12 MED ORDER — ACETAMINOPHEN 325 MG PO TABS: 650.0000 mg | ORAL_TABLET | ORAL | Status: DC | PRN

## 2019-08-12 MED ORDER — IOHEXOL 350 MG/ML SOLN
75.0000 mL | Freq: Once | INTRAVENOUS | Status: AC | PRN
  Administered 2019-08-12: 75 mL via INTRAVENOUS

## 2019-08-12 MED ORDER — ASPIRIN EC 325 MG PO TBEC: 650.0000 mg | DELAYED_RELEASE_TABLET | Freq: Once | ORAL | Status: DC

## 2019-08-12 MED ORDER — LABETALOL HCL 5 MG/ML IV SOLN
10.0000 mg | Freq: Once | INTRAVENOUS | Status: AC
  Administered 2019-08-12: 10 mg via INTRAVENOUS
  Filled 2019-08-12: qty 4

## 2019-08-12 NOTE — H&P (Signed)
[facility] Admission History and Physical Service Pager: [phone]  Patient name: Lisa [name] Medical record number: [id number] Date of birth: [birth year] Age: 69 y.o. Gender: female  Primary Care Provider: No primary care provider on file. Consultants: Neurology Code Status: Full  Chief Complaint: Confusion  Assessment and Plan: Lisa [name] is a 69 y.o. female presenting with 5 days of confusion and difficulty speaking.  Patient denies any past medical history.   Ischemic CVA Subacute, left superior MCA territory, possible acute infarct in the anterior lateral left frontal lobe.  Does not any any major hemiparesis or prominent dysarthria.  EKG did not show any atrial fibrillation. Patient is evaluated by neurology.  Recommend work-up with brain MRI with and without contrast, CTA head, neck with without contrast, T T E.  Holding aspirin for now until further work-up.  She is outside the window for permissive hypertension or TPA. -Admit to [facility], attending Dr. Andria Frames -MRI brain with without contrast -CTA head and neck with without contrast -TTE -Neurology recommendations appreciated -Cardiac monitoring -SLP/PT/OT -Stratification labs lipid panel and hemoglobin A1c  Hypertension Initially presenting with systolic blood pressures in the 200s.  Patient was given IV labetalol which did decrease this to systolics in the 147W.  Patient not endorsing residual headache.  -Discontinue IV labetalol -Start losartan 25 mg -Adjust blood pressure medications as needed  FEN/GI: N.p.o. until evaluated by SLP Prophylaxis: Lovenox  Disposition: Inpatient  History of Present Illness: Lisa [name] is a 69 y.o. female presenting with 5 days of confusion and difficulty speaking.  Patient reports that it was worse this morning which prompted her coming into the ED by EMS.  Husband is at bedside and confirmed story.  Patient without past medical history,  although she has not seen a doctor in several decades.  Review Of Systems: Per HPI   Patient Active Problem List   Diagnosis Date Noted  . CVA (cerebral vascular accident) ([place]) 08/12/2019    Past Medical History: History reviewed. No pertinent past medical history.  Past Surgical History: History reviewed. No pertinent surgical history.  Social History: Social History   Tobacco Use  . Smoking status: Not on file  Substance Use Topics  . Alcohol use: Not on file  . Drug use: Not on file   Additional social history: Husband's name is [name] Please also refer to relevant sections of EMR.  Family History: Patient endorses that she had hypertension in both her mother and father Allergies and Medications: Not on File No current facility-administered medications on file prior to encounter.   No current outpatient medications on file prior to encounter.    Objective: BP (!) 182/78 (BP Location: Left Arm)   Pulse 81   Temp 98 F (36.7 C) (Oral)   Resp (!) 21   SpO2 96%  Exam: General: No acute distress, no dysarthric speech, but does seem slightly inattentive or may be having difficulty finding words Eyes: PERRLA, EOMI ENTM: Moist mucous membranes Neck: Supple Cardiovascular: RRR, no MRG Respiratory: Clear to auscultation bilaterally, normal work of breathing Gastrointestinal: Nontender, nondistended MSK: 5 of 5 strength in right upper extremities, 5 of 5 strength in bilateral lower extremities, right may be slightly weaker than the left Derm: Dry and intact Neuro: Cranial nerves II through XII grossly intact Psych: Pleasant demeanor  Labs and Imaging: CBC BMET  Recent Labs  Lab 08/12/19 1058 08/12/19 1058 08/12/19 1103  WBC 10.6*  --   --   HGB  17.8*   < > 18.0*  HCT 53.8*   < > 53.0*  PLT 221  --   --    < > = values in this interval not displayed.   Recent Labs  Lab 08/12/19 1058 08/12/19 1058 08/12/19 1103  NA 138   < > 137  K 4.3   < > 4.0  CL 102    < > 102  CO2 20*  --   --   BUN 10   < > 11  CREATININE 0.65   < > 0.40*  GLUCOSE 209*   < > 204*  CALCIUM 9.9  --   --    < > = values in this interval not displayed.     CT HEAD CODE STROKE WO CONTRAST  Result Date: 08/12/2019 CLINICAL DATA:  Code stroke.  Acute confusion EXAM: CT HEAD WITHOUT CONTRAST TECHNIQUE: Contiguous axial images were obtained from the base of the skull through the vertex without intravenous contrast. COMPARISON:  None. FINDINGS: Brain: Large area of cytotoxic edema in the left MCA territory affecting the upper division, extending from insula to frontal parietal cortex and involving the posterior left putamen. Accounting for motion and streak artifact, no convincing acute infarct in the right cerebrum, blurring at the right frontal operculum could be explained by these factors. Especially on reformats there could be lateral left frontal infarct that is more subtle and acute. No hematoma, hydrocephalus, or collection. Vascular: Atherosclerotic calcification.  No hyperdense vessel Skull: Negative Sinuses/Orbits: Negative Other: These results were communicated to Dr. Cheral Marker at [facility] 6/30/2021by text page via the [facility] messaging system. ASPECTS ([place] Stroke Program Early CT Score) - Ganglionic level infarction (caudate, lentiform nuclei, internal capsule, insula, M1-M3 cortex): 5 - Supraganglionic infarction (M4-M6 cortex): 1 Total score (0-10 with 10 being normal): 6 IMPRESSION: Subacute appearing infarct in the left superior division MCA territory. There may be more acute infarct in the anterolateral left frontal lobe. ASPECTS is 6 at best. Electronically Signed   By: Monte Fantasia M.D.   On: 08/12/2019 11:13    Bonnita Hollow, MD 08/12/2019, 1:52 PM PGY-3, [place] Intern pager: [phone], text pages welcome

## 2019-08-12 NOTE — Code Documentation (Signed)
Stroke Response Nurse Documentation Code Documentation  Lisa [name] is a 69 y.o. female arriving to [place]. [facility] ED via [facility] on 08/12/2019 with no known past medical hx, per husband patient has not been to the doctor in 30 years. Code stroke was activated by EMS. Patient from home where she was LKW at [place] per husband, LKW questionable as husband states that patient's confusion has been waxing and waning over past 5 days. Husband states he called 42 today when he found patient sitting on floor with AMS and unable to answer his questions. On No antithrombotic PTA. Stroke team at the bedside on patient arrival. Labs drawn and patient cleared for CT by EDP [place]. Patient to CT with team. NIHSS 7, see documentation for details and code stroke times. Patient with right hemianopia, right facial droop and Global aphasia  on exam. The following imaging was completed:  CT Head. Patient is not a candidate for tPA due to LKW unclear, symptom onset 5 days ago per Dr. Cheral Marker. Care/Plan: non-emergent MRI with q2h neuro checks. Bedside handoff with ED RN [place].    Jo[place]  Stroke Response RN

## 2019-08-12 NOTE — ED Provider Notes (Addendum)
[facility] EMERGENCY DEPARTMENT Provider Note   CSN: [id number] Arrival date & time: 08/12/19  1053  An emergency department physician performed an initial assessment on this suspected stroke patient at 1054.  History Chief Complaint  Patient presents with  . Code Stroke    Lisa [name] is a 69 y.o. female without known medical history who presents to the emergency department via EMS initially as a code stroke due to worsening confusion this morning at 0830 AM.  Per EMS patient has been having intermittent confusion states over the past 5 days, the seem to acutely worsen at 830 this morning. Prior to 5 days ago her mentation was normal.  On arrival to the ED patient has poor attention with dysphagia and is unable to provide complete history on her own therefore level 5 Caveat applies.   HPI     History reviewed. No pertinent past medical history.  There are no problems to display for this patient.   History reviewed. No pertinent surgical history.   OB History   No obstetric history on file.     History reviewed. No pertinent family history.  Social History   Tobacco Use  . Smoking status: Not on file  Substance Use Topics  . Alcohol use: Not on file  . Drug use: Not on file    Home Medications Prior to Admission medications   Not on File    Allergies    Patient has no allergy information on record.  Review of Systems   Review of Systems  Unable to perform ROS: Mental status change    Physical Exam Updated Vital Signs BP (!) 195/160 (BP Location: Right Arm)   Pulse (!) 111   Temp 98 F (36.7 C) (Oral)   Resp (!) 28   SpO2 94%   Physical Exam Vitals and nursing note reviewed.  Constitutional:      General: She is not in acute distress.    Appearance: She is well-developed. She is not toxic-appearing.  HENT:     Head: Normocephalic and atraumatic.  Eyes:     General:        Right eye: No discharge.        Left eye: No  discharge.     Conjunctiva/sclera: Conjunctivae normal.  Cardiovascular:     Rate and Rhythm: Regular rhythm. Tachycardia present.  Pulmonary:     Effort: Pulmonary effort is normal. No respiratory distress.     Breath sounds: Normal breath sounds. No wheezing, rhonchi or rales.  Abdominal:     General: There is no distension.     Palpations: Abdomen is soft.     Tenderness: There is no abdominal tenderness.  Musculoskeletal:     Cervical back: Neck supple.  Skin:    General: Skin is warm and dry.     Findings: No rash.  Neurological:     Mental Status: She is alert.     Comments: Patient has dysphagia, seems confused with difficulty maintaining attention, with word finding & with comprehension. PERRL. EOMI, somewhat hesitant with right gaze.  No obvious facial droop.  She has 5 out of 5 strength with grip strength and with plantar/dorsiflexion bilaterally, however right upper extremity and right lower extremity drift some with arms held upper with asking patient to hold leg in the air.  Sensation grossly intact x4.      ED Results / Procedures / Treatments   Labs (all labs ordered are listed, but only abnormal results are  displayed) Labs Reviewed  CBC - Abnormal; Notable for the following components:      Result Value   WBC 10.6 (*)    RBC 6.05 (*)    Hemoglobin 17.8 (*)    HCT 53.8 (*)    All other components within normal limits  COMPREHENSIVE METABOLIC PANEL - Abnormal; Notable for the following components:   CO2 20 (*)    Glucose, Bld 209 (*)    Anion gap 16 (*)    All other components within normal limits  CBG MONITORING, ED - Abnormal; Notable for the following components:   Glucose-Capillary 209 (*)    All other components within normal limits  I-STAT CHEM 8, ED - Abnormal; Notable for the following components:   Creatinine, Ser 0.40 (*)    Glucose, Bld 204 (*)    Calcium, Ion 1.12 (*)    TCO2 21 (*)    Hemoglobin 18.0 (*)    HCT 53.0 (*)    All other  components within normal limits  PROTIME-INR  APTT  DIFFERENTIAL  CBG MONITORING, ED    EKG None  Radiology CT HEAD CODE STROKE WO CONTRAST  Result Date: 08/12/2019 CLINICAL DATA:  Code stroke.  Acute confusion EXAM: CT HEAD WITHOUT CONTRAST TECHNIQUE: Contiguous axial images were obtained from the base of the skull through the vertex without intravenous contrast. COMPARISON:  None. FINDINGS: Brain: Large area of cytotoxic edema in the left MCA territory affecting the upper division, extending from insula to frontal parietal cortex and involving the posterior left putamen. Accounting for motion and streak artifact, no convincing acute infarct in the right cerebrum, blurring at the right frontal operculum could be explained by these factors. Especially on reformats there could be lateral left frontal infarct that is more subtle and acute. No hematoma, hydrocephalus, or collection. Vascular: Atherosclerotic calcification.  No hyperdense vessel Skull: Negative Sinuses/Orbits: Negative Other: These results were communicated to Dr. Cheral Marker at [place] 6/30/2021by text page via the [facility] messaging system. ASPECTS ([place] Stroke Program Early CT Score) - Ganglionic level infarction (caudate, lentiform nuclei, internal capsule, insula, M1-M3 cortex): 5 - Supraganglionic infarction (M4-M6 cortex): 1 Total score (0-10 with 10 being normal): 6 IMPRESSION: Subacute appearing infarct in the left superior division MCA territory. There may be more acute infarct in the anterolateral left frontal lobe. ASPECTS is 6 at best. Electronically Signed   By: Monte Fantasia M.D.   On: 08/12/2019 11:13    Procedures Procedures (including critical care time)  CRITICAL CARE Performed by: Kennith Maes   Total critical care time: 30 minutes  Critical care time was exclusive of separately billable procedures and treating other patients.  Critical care was necessary to treat or prevent imminent or  life-threatening deterioration.  Critical care was time spent personally by me on the following activities: development of treatment plan with patient and/or surrogate as well as nursing, discussions with consultants, evaluation of patient's response to treatment, examination of patient, obtaining history from patient or surrogate, ordering and performing treatments and interventions, ordering and review of laboratory studies, ordering and review of radiographic studies, pulse oximetry and re-evaluation of patient's condition.  Medications Ordered in ED Medications  sodium chloride flush (NS) 0.9 % injection 3 mL (has no administration in time range)    ED Course  I have reviewed the triage vital signs and the nursing notes.  Pertinent labs & imaging results that were available during my care of the patient were reviewed by me and considered in my  medical decision making (see chart for details).    EVEY MCMAHAN was evaluated in Emergency Department on 08/12/2019 for the symptoms described in the history of present illness. He/she was evaluated in the context of the global COVID-19 pandemic, which necessitated consideration that the patient might be at risk for infection with the SARS-CoV-2 virus that causes COVID-19. Institutional protocols and algorithms that pertain to the evaluation of patients at risk for COVID-19 are in a state of rapid change based on information released by regulatory bodies including the CDC and federal and state organizations. These policies and algorithms were followed during the patient's care in the ED.  MDM Rules/Calculators/A&P                         Patient presents to the ED initially as a code stroke, she was met at the bridge by neurology and ED team and taken straight to CT scanner.  Given her mental status changes began 5 days prior code stroke was canceled.  Considered however patient is not a TPA candidate- outside of window.  She seems confused, has  dysphagia, and has very mild right-sided deficits noted.  Additional history obtained:  Additional history obtained from EMS.  No prior records available upon attempt of chart review.  Patient reportedly has not seen a doctor in over 30 years.  Lab Tests:  I Ordered, reviewed, and interpreted labs, which included:  CBG: 209 CBC: Mild elevated WBC and hemoglobin/hematocrit.   Metabolic panel notable for hyperglycemia.  Renal function preserved. PT/INR/APTT: Within normal limits  Imaging Studies ordered:  I ordered imaging studies which included CT head wo contrast initially, I independently visualized and interpreted imaging which showed Subacute appearing infarct in the left superior division MCA territory. There may be more acute infarct in the anterolateral left frontal lobe. ASPECTS is 6 at best.  ED Course:  Discussed findings and plan of care with neurology service:  Recommendations: 1. MRI brain with and without contrast  2. Full stroke work up to include TTE, CTA of head and neck, cardiac telemetry 3. PT/OT/Speech 4. Hold off on ASA for now given findings suggestive of petechial hemorrhage in the bed of the left temporal lobe lesion 5. BP management. Out of permissive HTN time window.  6. Stroke Team to follow tomorrow AM.   Patient hypertensive- further discussed w/ neuro, will try labetalol.   12:08: CONSULT: Discussed case with family medicine residency service- accept admission.   Findings and plan of care discussed with supervising physician Dr. Alvino Chapel who is in agreement.   Updated patient and her husband at bedside on results and plan of care, they are in agreement.  Portions of this note were generated with [employer]. Dictation errors may occur despite best attempts at proofreading.   Final Clinical Impression(s) / ED Diagnoses Final diagnoses:  Acute ischemic stroke [facility])    Rx / DC Orders ED Discharge Orders    None       Amaryllis Dyke, PA-C 08/12/19 [address], PA-C 08/12/19 [address] PA-C 08/12/19 1320    Davonna Belling, MD 08/12/19 1421  Neurology team updated on CTA results.     Leafy Kindle 08/12/19 1507    Davonna Belling, MD 08/12/19 1511

## 2019-08-12 NOTE — ED Triage Notes (Signed)
PT BIB [facility] for a code stroke, however last seen normal questionably up to five days ago. Called [facility] 0830 this AM. Per husband pt has been increasingly confused over the last few days, primary symptom is aphasia.

## 2019-08-12 NOTE — ED Notes (Signed)
Patient transported to CT 

## 2019-08-12 NOTE — Consult Note (Addendum)
NEURO HOSPITALIST CONSULT NOTE   Requestig physician: Dr. Alvino Chapel  Reason for Consult: AMS  History obtained from:  EMS and Chart     HPI:                                                                                                                                          Lisa [name] is an 69 y.o. female presenting via EMS as a Code Stroke after husband noted worsening confusion this AM at 0830. He had seen her at 0825 at her recent new baseline of a mildly confused state, which had been manifest since 5 days prior. Before that, her mentation was normal.   On arrival to the ED, she was confused with poor attention and dysphasia. She is unable to provide her own history.   No past medical history on file.  No family history on file.           Social History:  has no history on file for tobacco use, alcohol use, and drug use.  Not on File  MEDICATIONS:                                                                                                                      No current facility-administered medications on file prior to encounter.   No current outpatient medications on file prior to encounter.    ROS:                                                                                                                                       Unable to obtain a reliable ROS due to dysphasia and confusion.   There were no vitals  taken for this visit.  General Examination:                                                                                                       Physical Exam  HEENT-  [place]/AT  Lungs- Respirations unlabored Extremities-  No edema  Neurological Examination Mental Status: Awake with decreased attention. Odd, unconcerned affect. Subtle right hemineglect. Speech fluent and nondysarthric, but with significant word finding difficulty and comprehension deficit.  Cranial Nerves: II: Temporal visual fields intact bilaterally.    III,IV, VI: ptosis not present, EOMI are full but with some hesitancy when gazing to the right V,VII: Slight decrease of right NL fold. Facial temp sensation subjectively normal bilaterally VIII: hearing intact to voice IX,X: No hypophonia XI: Tends to keep head turned slightly to the left.  XII: midline tongue extension Motor: Decreased spontaneous movement on the right, but with 5/5 strength. RUE with prominent upward drift when arms held antigravity with eyes closed.  RLE 4+/5 with bobbing drift.  LUE and LLE 5/5 Sensory: Subjectively normal FT and temp sensation to BUE and BLE.  Deep Tendon Reflexes: Slightly brisker reflexes on the right.  Plantars: Right: Briskly upgoing   Left: Upgoing Cerebellar: Mild dysmetria of RUE. Normal FNF on the left.  Gait: Deferred   Lab Results: Basic Metabolic Panel: No results for input(s): NA, K, CL, CO2, GLUCOSE, BUN, CREATININE, CALCIUM, MG, PHOS in the last 168 hours.  CBC: No results for input(s): WBC, NEUTROABS, HGB, HCT, MCV, PLT in the last 168 hours.  Cardiac Enzymes: No results for input(s): CKTOTAL, CKMB, CKMBINDEX, TROPONINI in the last 168 hours.  Lipid Panel: No results for input(s): CHOL, TRIG, HDL, CHOLHDL, VLDL, LDLCALC in the last 168 hours.  Imaging: No results found.  Assessment:  69 year old female with a 5 day history of confusion, presenting after acute worsening this morning.  1. CT head reveals a subacute-appearing infarct in the left superior division MCA territory. There may be more acute infarct in the anterolateral left frontal lobe. Relatively unlikely but also possible would be a primary brain neoplasm with petechial hemorrhage.  2. Exam findings include dysphasia, confusion, subtle right sided motor deficits and mild right hemineglect.   Recommendations: 1. MRI brain with and without contrast  2. Full stroke work up to include TTE, CTA of head and neck, cardiac telemetry 3. PT/OT/Speech 4. Hold off on ASA  for now given findings suggestive of petechial hemorrhage in the bed of the left temporal lobe lesion 5. BP management. Out of permissive HTN time window.  6. Stroke Team to follow tomorrow AM.   Addendum: CTA of head and neck completed.  CTA neck: 1. Prominent mixed plaque within the left carotid bifurcation and proximal left ICA. There is resultant high-grade, likely near-occlusive, stenosis at the origin of the left ICA. Carotid artery duplex is recommended to ensure some flow at this site prior to any intervention. 2. The right common carotid, right internal carotid and bilateral vertebral arteries are patent within the neck without hemodynamically significant stenosis. Mild to moderate calcified  plaque within the right carotid bifurcation and proximal ICA.  CTA head: 1. Paucity of flow within M2 and more distal left MCA branch vessels with suspected age-indeterminate occlusion of superior and inferior division proximal M2 MCA branches. There is a patent M2 superior division left MCA vessel which has a site of moderate to severe stenosis in its proximal segment (series 12, image 28). 2. Sites of moderate stenosis within the M1 left middle cerebral artery. 3. Severe focal stenosis within a proximal to mid superior division right M2 MCA branch vessel. 4. Moderate/severe focal stenosis within a separate proximal to mid superior division right M2 MCA branch vessel. 5. Moderate focal stenosis within the distal A2 right anterior cerebral artery. 6. High-grade focal stenosis within the P2/P3 right PCA. 7. Moderate/severe focal stenosis within the P2/P3 left PCA.  Additional Recommendations: -- Obtain carotid ultrasound (ordered) -- Obtain Vascular Surgery consult after carotid ultrasound -- Given the severity of the left carotid stenosis and intracranial atherosclerotic disease, the benefits of starting ASA now outweigh the risks, as the probable hemorrhagic conversion seen on CT  is petechial with no gross hematoma seen. Ordering 650 mg po ASA load followed by 81 mg po qd.   Electronically signed: Dr. Kerney Elbe 08/12/2019, 11:01 AM

## 2019-08-13 ENCOUNTER — Inpatient Hospital Stay (HOSPITAL_COMMUNITY): Payer: Medicare Other

## 2019-08-13 DIAGNOSIS — E119 Type 2 diabetes mellitus without complications: Secondary | ICD-10-CM

## 2019-08-13 DIAGNOSIS — I63232 Cerebral infarction due to unspecified occlusion or stenosis of left carotid arteries: Secondary | ICD-10-CM

## 2019-08-13 DIAGNOSIS — E7800 Pure hypercholesterolemia, unspecified: Secondary | ICD-10-CM

## 2019-08-13 DIAGNOSIS — I6522 Occlusion and stenosis of left carotid artery: Secondary | ICD-10-CM

## 2019-08-13 DIAGNOSIS — I160 Hypertensive urgency: Secondary | ICD-10-CM

## 2019-08-13 DIAGNOSIS — I6389 Other cerebral infarction: Secondary | ICD-10-CM

## 2019-08-13 DIAGNOSIS — F17200 Nicotine dependence, unspecified, uncomplicated: Secondary | ICD-10-CM

## 2019-08-13 DIAGNOSIS — I639 Cerebral infarction, unspecified: Secondary | ICD-10-CM

## 2019-08-13 DIAGNOSIS — E1165 Type 2 diabetes mellitus with hyperglycemia: Secondary | ICD-10-CM

## 2019-08-13 DIAGNOSIS — E1149 Type 2 diabetes mellitus with other diabetic neurological complication: Secondary | ICD-10-CM

## 2019-08-13 LAB — BASIC METABOLIC PANEL
Anion gap: 13 (ref 5–15)
BUN: 10 mg/dL (ref 8–23)
CO2: 24 mmol/L (ref 22–32)
Calcium: 9.7 mg/dL (ref 8.9–10.3)
Chloride: 103 mmol/L (ref 98–111)
Creatinine, Ser: 0.59 mg/dL (ref 0.44–1.00)
GFR calc Af Amer: >60 mL/min (ref >60)
GFR calc non Af Amer: >60 mL/min (ref >60)
Glucose, Bld: 157 mg/dL — ABNORMAL HIGH (ref 70–99)
Potassium: 3.8 mmol/L (ref 3.5–5.1)
Sodium: 140 mmol/L (ref 135–145)

## 2019-08-13 LAB — GLUCOSE, CAPILLARY
Glucose-Capillary: 132 mg/dL — ABNORMAL HIGH (ref 70–99)
Glucose-Capillary: 133 mg/dL — ABNORMAL HIGH (ref 70–99)
Glucose-Capillary: 163 mg/dL — ABNORMAL HIGH (ref 70–99)

## 2019-08-13 LAB — LIPID PANEL
Cholesterol: 229 mg/dL — ABNORMAL HIGH (ref 0–200)
HDL: 38 mg/dL — ABNORMAL LOW (ref >40)
LDL Cholesterol: 158 mg/dL — ABNORMAL HIGH (ref 0–99)
Total CHOL/HDL Ratio: 6 RATIO
Triglycerides: 164 mg/dL — ABNORMAL HIGH (ref <150)
VLDL: 33 mg/dL (ref 0–40)

## 2019-08-13 LAB — CBC
HCT: 51.3 % — ABNORMAL HIGH (ref 36.0–46.0)
Hemoglobin: 16.7 g/dL — ABNORMAL HIGH (ref 12.0–15.0)
MCH: 29.1 pg (ref 26.0–34.0)
MCHC: 32.6 g/dL (ref 30.0–36.0)
MCV: 89.5 fL (ref 80.0–100.0)
Platelets: 214 10*3/uL (ref 150–400)
RBC: 5.73 MIL/uL — ABNORMAL HIGH (ref 3.87–5.11)
RDW: 14.6 % (ref 11.5–15.5)
WBC: 8.5 10*3/uL (ref 4.0–10.5)
nRBC: 0 % (ref 0.0–0.2)

## 2019-08-13 LAB — RAPID URINE DRUG SCREEN, HOSP PERFORMED
Amphetamines: NOT DETECTED
Barbiturates: NOT DETECTED
Benzodiazepines: NOT DETECTED
Cocaine: NOT DETECTED
Opiates: NOT DETECTED
Tetrahydrocannabinol: POSITIVE — AB

## 2019-08-13 LAB — ECHOCARDIOGRAM COMPLETE: Weight: 3231.06 oz

## 2019-08-13 LAB — HEMOGLOBIN A1C
Hgb A1c MFr Bld: 8.2 % — ABNORMAL HIGH (ref 4.8–5.6)
Mean Plasma Glucose: 188.64 mg/dL

## 2019-08-13 MED ORDER — INSULIN ASPART 100 UNIT/ML ~~LOC~~ SOLN
0.0000 [IU] | Freq: Three times a day (TID) | SUBCUTANEOUS | Status: DC
  Administered 2019-08-13: 2 [IU] via SUBCUTANEOUS
  Administered 2019-08-13 – 2019-08-14 (×2): 1 [IU] via SUBCUTANEOUS
  Administered 2019-08-14: 2 [IU] via SUBCUTANEOUS

## 2019-08-13 MED ORDER — CLOPIDOGREL BISULFATE 75 MG PO TABS
75.0000 mg | ORAL_TABLET | Freq: Every day | ORAL | Status: DC
  Administered 2019-08-14: 75 mg via ORAL
  Filled 2019-08-13 (×2): qty 1

## 2019-08-13 MED ORDER — ATORVASTATIN CALCIUM 80 MG PO TABS
80.0000 mg | ORAL_TABLET | Freq: Every day | ORAL | Status: DC
  Administered 2019-08-13 – 2019-08-14 (×2): 80 mg via ORAL
  Filled 2019-08-13 (×3): qty 1

## 2019-08-13 MED ORDER — CLOPIDOGREL BISULFATE 75 MG PO TABS
300.0000 mg | ORAL_TABLET | Freq: Once | ORAL | Status: AC
  Administered 2019-08-13: 300 mg via ORAL
  Filled 2019-08-13: qty 4

## 2019-08-13 MED ORDER — ASPIRIN EC 325 MG PO TBEC
325.0000 mg | DELAYED_RELEASE_TABLET | Freq: Every day | ORAL | Status: DC
  Administered 2019-08-13 – 2019-08-14 (×2): 325 mg via ORAL
  Filled 2019-08-13 (×2): qty 1

## 2019-08-13 NOTE — Consult Note (Addendum)
Hospital Consult    Reason for Consult:  L CVA; L carotid stenosis Requesting Physician:  Dr. Andria Frames MRN #:  [id number]  History of Present Illness: This is a 69 y.o. female who presented to the emergency department due to progressive confusion and dysphagia over the course of about 4 to 5 days.  She underwent stroke work-up including MRI demonstrating acute to subacute left hemispheric CVA.  CTA neck demonstrates near occlusive proximal ICA lesion.  This is confirmed with 80 to 99% stenosis of left ICA noted on carotid duplex this morning.  Patient states she is back to near baseline as far as mentation.  She denies any right-sided numbness or weakness.  She also denies any vision changes.  She is a tobacco smoker however states she is motivated to no longer smoke after diagnosis of CVA.  She denies any claudication, nonhealing wounds, or rest pain of bilateral lower extremities.  She has been started on aspirin and Plavix this admission.  She has also been started on a statin.  History reviewed. No pertinent past medical history.  History reviewed. No pertinent surgical history.  No Known Allergies  Prior to Admission medications   Medication Sig Start Date End Date Taking? Authorizing Provider  MULTIPLE VITAMIN PO Take 1 tablet by mouth daily.   Yes [provider]    Social History   Socioeconomic History  . Marital status: Married    Spouse name: Not on file  . Number of children: Not on file  . Years of education: Not on file  . Highest education level: Not on file  Occupational History  . Not on file  Tobacco Use  . Smoking status: Not on file  Substance and Sexual Activity  . Alcohol use: Not on file  . Drug use: Not on file  . Sexual activity: Not on file  Other Topics Concern  . Not on file  Social History Narrative  . Not on file   Social Determinants of Health   Financial Resource Strain:   . Difficulty of Paying Living Expenses:   Food Insecurity:    . Worried About Charity fundraiser in the Last Year:   . Arboriculturist in the Last Year:   Transportation Needs:   . Film/video editor (Medical):   Marland Kitchen Lack of Transportation (Non-Medical):   Physical Activity:   . Days of Exercise per Week:   . Minutes of Exercise per Session:   Stress:   . Feeling of Stress :   Social Connections:   . Frequency of Communication with Friends and Family:   . Frequency of Social Gatherings with Friends and Family:   . Attends Religious Services:   . Active Member of Clubs or Organizations:   . Attends Archivist Meetings:   Marland Kitchen Marital Status:   Intimate Partner Violence:   . Fear of Current or Ex-Partner:   . Emotionally Abused:   Marland Kitchen Physically Abused:   . Sexually Abused:      History reviewed. No pertinent family history.  ROS: Otherwise negative unless mentioned in HPI  Physical Examination  Vitals:   08/13/19 0357 08/13/19 0700  BP: (!) 195/75 (!) 181/93  Pulse: 90 82  Resp: 17 18  Temp: 98.3 F (36.8 C) 98 F (36.7 C)  SpO2: 97% 100%   There is no height or weight on file to calculate BMI.  General:  WDWN in NAD Gait: Not observed HENT: WNL, normocephalic Pulmonary: normal non-labored breathing,  without Rales, rhonchi,  wheezing Cardiac: regular Abdomen:  soft, NT/ND, no masses Skin: without rashes Vascular Exam/Pulses: Symmetrical radial pulses; palpable right DP pulse; no palpable left pedal pulses however foot is warm to touch with good capillary refill Extremities: without ischemic changes, without Gangrene , without cellulitis; without open wounds;  Musculoskeletal: no muscle wasting or atrophy  Neurologic: A&O X 3;  No focal weakness or paresthesias are detected; speech is fluent/normal Psychiatric:  The pt has Normal affect. Lymph:  Unremarkable  CBC    Component Value Date/Time   WBC 8.5 08/13/2019 0320   RBC 5.73 (H) 08/13/2019 0320   HGB 16.7 (H) 08/13/2019 0320   HCT 51.3 (H) 08/13/2019  0320   PLT 214 08/13/2019 0320   MCV 89.5 08/13/2019 0320   MCH 29.1 08/13/2019 0320   MCHC 32.6 08/13/2019 0320   RDW 14.6 08/13/2019 0320   LYMPHSABS 2.0 08/12/2019 1058   MONOABS 0.8 08/12/2019 1058   EOSABS 0.1 08/12/2019 1058   BASOSABS 0.0 08/12/2019 1058    BMET    Component Value Date/Time   NA 140 08/13/2019 0320   K 3.8 08/13/2019 0320   CL 103 08/13/2019 0320   CO2 24 08/13/2019 0320   GLUCOSE 157 (H) 08/13/2019 0320   BUN 10 08/13/2019 0320   CREATININE 0.59 08/13/2019 0320   CALCIUM 9.7 08/13/2019 0320   GFRNONAA >60 08/13/2019 0320   GFRAA >60 08/13/2019 0320    COAGS: Lab Results  Component Value Date   INR 1.0 08/12/2019     Non-Invasive Vascular Imaging:   CTA demonstrating near occlusion of proximal left ICA Right ICA without hemodynamically significant stenosis  Carotid duplex demonstrating 80 to 99% stenosis of left ICA   ASSESSMENT/PLAN: This is a 69 y.o. female with symptomatic left ICA stenosis, CVA  Neuro exam back to near baseline per patient CTA of neck demonstrates a near occlusive lesion of left ICA origin and this is confirmed with carotid duplex Agree with aspirin, Plavix, statin regimen Plan will be for revascularization of left ICA within the next 2 weeks Patient will prefer to discharge home as soon as today and come back as an outpatient On call vascular surgeon Dr. Donzetta Matters will evaluate the patient later today and provide further treatment plans   Dagoberto Ligas PA-C [facility] [phone]  I have independently interviewed and examined patient and agree with PA assessment and plan above. Patient with apparent Broca's aphasia was interviewed in the presence of her husband. She was able to answer all questions. I discussed with both patient and her husband the options for treatment of what appears to be a symptomatic left ICA lesion. These include medical therapy and she is medically optimized with dual  antiplatelet therapy and statin. Recommendation would be for surgical intervention either via stent or carotid endarterectomy. In this case I have recommended stenting via transcarotid route given the high nature of the lesion. Patient has been recommended for inpatient rehab although this husband is adamant that she will go home. She will need intervention within 2 weeks of her event and we will schedule this pending disposition plans. She will need to continue her dual antiplatelet therapy and statin on discharge.  Keeana Pieratt C. Donzetta Matters, MD [facility] [place] Office: [phone] Pager: [phone]

## 2019-08-13 NOTE — Plan of Care (Signed)
Pt is alert. Expressive aphasia and on RA. BP under 200s.  Problem: Education: Goal: Knowledge of disease or condition will improve Outcome: Progressing Goal: Knowledge of secondary prevention will improve Outcome: Progressing Goal: Knowledge of patient specific risk factors addressed and post discharge goals established will improve Outcome: Progressing Goal: Individualized Educational Video(s) Outcome: Progressing   Problem: Coping: Goal: Will verbalize positive feelings about self Outcome: Progressing Goal: Will identify appropriate support needs Outcome: Progressing   Problem: Self-Care: Goal: Ability to participate in self-care as condition permits will improve Outcome: Progressing Goal: Verbalization of feelings and concerns over difficulty with self-care will improve Outcome: Progressing Goal: Ability to communicate needs accurately will improve Outcome: Progressing   Problem: Ischemic Stroke/TIA Tissue Perfusion: Goal: Complications of ischemic stroke/TIA will be minimized Outcome: Progressing

## 2019-08-13 NOTE — Plan of Care (Signed)
  Problem: Education: Goal: Knowledge of disease or condition will improve Outcome: Progressing Goal: Knowledge of secondary prevention will improve Outcome: Progressing Goal: Knowledge of patient specific risk factors addressed and post discharge goals established will improve Outcome: Progressing Goal: Individualized Educational Video(s) Outcome: Progressing   Problem: Education: Goal: Knowledge of disease or condition will improve Outcome: Progressing   Problem: Education: Goal: Knowledge of secondary prevention will improve Outcome: Progressing   Problem: Education: Goal: Knowledge of patient specific risk factors addressed and post discharge goals established will improve Outcome: Progressing   Problem: Education: Goal: Individualized Educational Video(s) Outcome: Progressing   Problem: Coping: Goal: Will verbalize positive feelings about self Outcome: Progressing   Problem: Coping: Goal: Will verbalize positive feelings about self Outcome: Progressing

## 2019-08-13 NOTE — Progress Notes (Signed)
STROKE TEAM PROGRESS NOTE   INTERVAL HISTORY No family at bedside. Pt lying in bed, still has mild to moderate expressive aphasia, right facial droop and right hand weakness. CT and MRI showed left MCA infarct and CTA head and neck showed left ICA bulb high grade stenosis. CUS and vascular surgery consult pending.   Vitals:   08/12/19 2005 08/12/19 2339 08/13/19 0357 08/13/19 0700  BP: (!) 194/83 (!) 176/102 (!) 195/75 (!) 181/93  Pulse: 88 92 90 82  Resp: 17 19 17 18   Temp: 98.2 F (36.8 C) 98.9 F (37.2 C) 98.3 F (36.8 C) 98 F (36.7 C)  TempSrc: Oral Oral Oral Oral  SpO2: 95% 96% 97% 100%  Weight:        CBC:  Recent Labs  Lab 08/12/19 1058 08/12/19 1058 08/12/19 1103 08/13/19 0320  WBC 10.6*  --   --  8.5  NEUTROABS 7.7  --   --   --   HGB 17.8*   < > 18.0* 16.7*  HCT 53.8*   < > 53.0* 51.3*  MCV 88.9  --   --  89.5  PLT 221  --   --  214   < > = values in this interval not displayed.    Basic Metabolic Panel:  Recent Labs  Lab 08/12/19 1058 08/12/19 1058 08/12/19 1103 08/13/19 0320  NA 138   < > 137 140  K 4.3   < > 4.0 3.8  CL 102   < > 102 103  CO2 20*  --   --  24  GLUCOSE 209*   < > 204* 157*  BUN 10   < > 11 10  CREATININE 0.65   < > 0.40* 0.59  CALCIUM 9.9  --   --  9.7   < > = values in this interval not displayed.   Lipid Panel:     Component Value Date/Time   CHOL 229 (H) 08/13/2019 0320   TRIG 164 (H) 08/13/2019 0320   HDL 38 (L) 08/13/2019 0320   CHOLHDL 6.0 08/13/2019 0320   VLDL 33 08/13/2019 0320   LDLCALC 158 (H) 08/13/2019 0320   HgbA1c:  Lab Results  Component Value Date   HGBA1C 8.2 (H) 08/13/2019   Urine Drug Screen: No results found for: LABOPIA, COCAINSCRNUR, LABBENZ, AMPHETMU, THCU, LABBARB  Alcohol Level No results found for: ETH  IMAGING past 24 hours CT Angio Head W/Cm &/Or Wo Cm  Addendum Date: 08/12/2019   ADDENDUM REPORT: 08/12/2019 14:56 ADDENDUM: These results were called by telephone at the time of  interpretation on 08/12/2019 at 2:56 pm to provider Davonna Belling , who verbally acknowledged these results. Electronically Signed   By: Kellie Simmering DO   On: 08/12/2019 14:56   Result Date: 08/12/2019 CLINICAL DATA:  Neuro deficit, acute, stroke suspected. Additional history provided: Confusion. EXAM: CT ANGIOGRAPHY HEAD AND NECK TECHNIQUE: Multidetector CT imaging of the head and neck was performed using the standard protocol during bolus administration of intravenous contrast. Multiplanar CT image reconstructions and MIPs were obtained to evaluate the vascular anatomy. Carotid stenosis measurements (when applicable) are obtained utilizing NASCET criteria, using the distal internal carotid diameter as the denominator. CONTRAST:  95mL OMNIPAQUE IOHEXOL 350 MG/ML SOLN COMPARISON:  Noncontrast head CT performed earlier the same day 08/12/2019 FINDINGS: CTA NECK FINDINGS Aortic arch: Standard aortic branching. Atherosclerotic plaque within the visualized aortic arch and proximal major branch vessels of the neck. No hemodynamically significant innominate or proximal subclavian artery stenosis.  Right carotid system: CCA and ICA patent within the neck without significant stenosis (50% or greater). Mild to moderate calcified plaque within the carotid bifurcation and proximal ICA. Left carotid system: CCA and ICA patent within the neck. Prominent mixed plaque within the carotid bifurcation and proximal ICA. There is resultant high-grade, likely near-occlusive stenosis at the origin of the ICA. Distal to this, the ICA is patent within the neck without significant stenosis Vertebral arteries: The vertebral arteries are codominant and patent within the neck without significant stenosis. Skeleton: No acute bony abnormality or aggressive osseous lesion. Cervical spondylosis with multilevel disc space narrowing, posterior disc osteophytes, uncovertebral and facet hypertrophy. Congenital fusion of the C6-C7 vertebrae. Other  neck: No neck mass or cervical lymphadenopathy. Upper chest: No consolidation within the imaged lung apices. Review of the MIP images confirms the above findings CTA HEAD FINDINGS Anterior circulation: The intracranial internal carotid arteries are patent. Calcified plaque within both vessels. Mild stenosis of the cavernous/paraclinoid right ICA. There is up to moderate stenosis within the paraclinoid left ICA. The M1 right middle cerebral artery is patent without significant stenosis. There is a severe focal stenosis within a proximal to mid superior division right M2 MCA branch vessel (series 12, image 14). Additional moderate/severe stenosis within a proximal to mid superior division right M2 MCA branch vessel (series 12, image 15). The M1 middle cerebral artery is patent with sites of up to moderate stenosis. There is a paucity of flow within M2 and more distal left MCA branch vessels with suspected age-indeterminate occlusion of superior and inferior division proximal M2 MCA branch vessels. There is a patent M2 superior division vessel which has a site of moderate to severe stenosis in its proximal segment (series 12, image 28). The anterior cerebral arteries are patent. There is a moderate focal stenosis within the distal A2 right anterior cerebral artery (series 12, image 20). No intracranial aneurysm is identified. Posterior circulation: The intracranial vertebral arteries are patent. Minimal calcified plaque within the V4 right vertebral artery. The basilar artery is patent with sites of mild atherosclerotic narrowing. Predominantly fetal origin of the left posterior cerebral artery. Posterior cerebral arteries are patent bilaterally. There is a high-grade focal stenosis within the right PCA at the P2/P3 junction (series 10, image 20). There is a moderate/severe stenosis within the left PCA at the P2/P3 junction (series 12, image 24). The right posterior communicating artery is hypoplastic or absent. Venous  sinuses: Within limitations of contrast timing, no convincing thrombus. Anatomic variants: As described Review of the MIP images confirms the above findings IMPRESSION: CTA neck: 1. Prominent mixed plaque within the left carotid bifurcation and proximal left ICA. There is resultant high-grade, likely near-occlusive, stenosis at the origin of the left ICA. Carotid artery duplex is recommended to ensure some flow at this site prior to any intervention. 2. The right common carotid, right internal carotid and bilateral vertebral arteries are patent within the neck without hemodynamically significant stenosis. Mild to moderate calcified plaque within the right carotid bifurcation and proximal ICA. CTA head: 1. Paucity of flow within M2 and more distal left MCA branch vessels with suspected age-indeterminate occlusion of superior and inferior division proximal M2 MCA branches. There is a patent M2 superior division left MCA vessel which has a site of moderate to severe stenosis in its proximal segment (series 12, image 28). 2. Sites of moderate stenosis within the M1 left middle cerebral artery. 3. Severe focal stenosis within a proximal to mid superior division right M2  MCA branch vessel. 4. Moderate/severe focal stenosis within a separate proximal to mid superior division right M2 MCA branch vessel. 5. Moderate focal stenosis within the distal A2 right anterior cerebral artery. 6. High-grade focal stenosis within the P2/P3 right PCA. 7. Moderate/severe focal stenosis within the P2/P3 left PCA. Electronically Signed: By: Kellie Simmering DO On: 08/12/2019 14:52   CT Angio Neck W and/or Wo Contrast  Addendum Date: 08/12/2019   ADDENDUM REPORT: 08/12/2019 14:56 ADDENDUM: These results were called by telephone at the time of interpretation on 08/12/2019 at 2:56 pm to provider Davonna Belling , who verbally acknowledged these results. Electronically Signed   By: Kellie Simmering DO   On: 08/12/2019 14:56   Result Date:  08/12/2019 CLINICAL DATA:  Neuro deficit, acute, stroke suspected. Additional history provided: Confusion. EXAM: CT ANGIOGRAPHY HEAD AND NECK TECHNIQUE: Multidetector CT imaging of the head and neck was performed using the standard protocol during bolus administration of intravenous contrast. Multiplanar CT image reconstructions and MIPs were obtained to evaluate the vascular anatomy. Carotid stenosis measurements (when applicable) are obtained utilizing NASCET criteria, using the distal internal carotid diameter as the denominator. CONTRAST:  41mL OMNIPAQUE IOHEXOL 350 MG/ML SOLN COMPARISON:  Noncontrast head CT performed earlier the same day 08/12/2019 FINDINGS: CTA NECK FINDINGS Aortic arch: Standard aortic branching. Atherosclerotic plaque within the visualized aortic arch and proximal major branch vessels of the neck. No hemodynamically significant innominate or proximal subclavian artery stenosis. Right carotid system: CCA and ICA patent within the neck without significant stenosis (50% or greater). Mild to moderate calcified plaque within the carotid bifurcation and proximal ICA. Left carotid system: CCA and ICA patent within the neck. Prominent mixed plaque within the carotid bifurcation and proximal ICA. There is resultant high-grade, likely near-occlusive stenosis at the origin of the ICA. Distal to this, the ICA is patent within the neck without significant stenosis Vertebral arteries: The vertebral arteries are codominant and patent within the neck without significant stenosis. Skeleton: No acute bony abnormality or aggressive osseous lesion. Cervical spondylosis with multilevel disc space narrowing, posterior disc osteophytes, uncovertebral and facet hypertrophy. Congenital fusion of the C6-C7 vertebrae. Other neck: No neck mass or cervical lymphadenopathy. Upper chest: No consolidation within the imaged lung apices. Review of the MIP images confirms the above findings CTA HEAD FINDINGS Anterior  circulation: The intracranial internal carotid arteries are patent. Calcified plaque within both vessels. Mild stenosis of the cavernous/paraclinoid right ICA. There is up to moderate stenosis within the paraclinoid left ICA. The M1 right middle cerebral artery is patent without significant stenosis. There is a severe focal stenosis within a proximal to mid superior division right M2 MCA branch vessel (series 12, image 14). Additional moderate/severe stenosis within a proximal to mid superior division right M2 MCA branch vessel (series 12, image 15). The M1 middle cerebral artery is patent with sites of up to moderate stenosis. There is a paucity of flow within M2 and more distal left MCA branch vessels with suspected age-indeterminate occlusion of superior and inferior division proximal M2 MCA branch vessels. There is a patent M2 superior division vessel which has a site of moderate to severe stenosis in its proximal segment (series 12, image 28). The anterior cerebral arteries are patent. There is a moderate focal stenosis within the distal A2 right anterior cerebral artery (series 12, image 20). No intracranial aneurysm is identified. Posterior circulation: The intracranial vertebral arteries are patent. Minimal calcified plaque within the V4 right vertebral artery. The basilar artery is patent  with sites of mild atherosclerotic narrowing. Predominantly fetal origin of the left posterior cerebral artery. Posterior cerebral arteries are patent bilaterally. There is a high-grade focal stenosis within the right PCA at the P2/P3 junction (series 10, image 20). There is a moderate/severe stenosis within the left PCA at the P2/P3 junction (series 12, image 24). The right posterior communicating artery is hypoplastic or absent. Venous sinuses: Within limitations of contrast timing, no convincing thrombus. Anatomic variants: As described Review of the MIP images confirms the above findings IMPRESSION: CTA neck: 1.  Prominent mixed plaque within the left carotid bifurcation and proximal left ICA. There is resultant high-grade, likely near-occlusive, stenosis at the origin of the left ICA. Carotid artery duplex is recommended to ensure some flow at this site prior to any intervention. 2. The right common carotid, right internal carotid and bilateral vertebral arteries are patent within the neck without hemodynamically significant stenosis. Mild to moderate calcified plaque within the right carotid bifurcation and proximal ICA. CTA head: 1. Paucity of flow within M2 and more distal left MCA branch vessels with suspected age-indeterminate occlusion of superior and inferior division proximal M2 MCA branches. There is a patent M2 superior division left MCA vessel which has a site of moderate to severe stenosis in its proximal segment (series 12, image 28). 2. Sites of moderate stenosis within the M1 left middle cerebral artery. 3. Severe focal stenosis within a proximal to mid superior division right M2 MCA branch vessel. 4. Moderate/severe focal stenosis within a separate proximal to mid superior division right M2 MCA branch vessel. 5. Moderate focal stenosis within the distal A2 right anterior cerebral artery. 6. High-grade focal stenosis within the P2/P3 right PCA. 7. Moderate/severe focal stenosis within the P2/P3 left PCA. Electronically Signed: By: Kellie Simmering DO On: 08/12/2019 14:52   MR Brain W and Wo Contrast  Result Date: 08/12/2019 CLINICAL DATA:  Stroke. EXAM: MRI HEAD WITHOUT AND WITH CONTRAST TECHNIQUE: Multiplanar, multiecho pulse sequences of the brain and surrounding structures were obtained without and with intravenous contrast. CONTRAST:  64mL GADAVIST GADOBUTROL 1 MMOL/ML IV SOLN COMPARISON:  CT a head and neck 08/12/2019 FINDINGS: Brain: Acute infarct left MCA territory. This involves the posterior insula extending into the left frontal parietal lobe involving cortex and white matter. Small additional areas  of acute infarct in the left middle frontal lobe. Multiple small areas of acute infarct in the left caudate. Negative for hemorrhage. The infarct shows mild cortical enhancement suggesting subacute duration. Ventricle size normal. Mild chronic ischemic changes in the white matter. Negative for mass lesion. Vascular: Normal arterial flow voids Skull and upper cervical spine: Negative Sinuses/Orbits: Paranasal sinuses clear.  Negative orbit. Other: None IMPRESSION: Acute infarct left MCA territory as described above. The infarct enhances suggesting subacute duration. No associated hemorrhage. Electronically Signed   By: Franchot Gallo M.D.   On: 08/12/2019 17:01   CT HEAD CODE STROKE WO CONTRAST  Result Date: 08/12/2019 CLINICAL DATA:  Code stroke.  Acute confusion EXAM: CT HEAD WITHOUT CONTRAST TECHNIQUE: Contiguous axial images were obtained from the base of the skull through the vertex without intravenous contrast. COMPARISON:  None. FINDINGS: Brain: Large area of cytotoxic edema in the left MCA territory affecting the upper division, extending from insula to frontal parietal cortex and involving the posterior left putamen. Accounting for motion and streak artifact, no convincing acute infarct in the right cerebrum, blurring at the right frontal operculum could be explained by these factors. Especially on reformats there could be  lateral left frontal infarct that is more subtle and acute. No hematoma, hydrocephalus, or collection. Vascular: Atherosclerotic calcification.  No hyperdense vessel Skull: Negative Sinuses/Orbits: Negative Other: These results were communicated to Dr. Cheral Marker at [place] 6/30/2021by text page via the [facility] messaging system. ASPECTS ([place] Stroke Program Early CT Score) - Ganglionic level infarction (caudate, lentiform nuclei, internal capsule, insula, M1-M3 cortex): 5 - Supraganglionic infarction (M4-M6 cortex): 1 Total score (0-10 with 10 being normal): 6 IMPRESSION: Subacute  appearing infarct in the left superior division MCA territory. There may be more acute infarct in the anterolateral left frontal lobe. ASPECTS is 6 at best. Electronically Signed   By: Monte Fantasia M.D.   On: 08/12/2019 11:13   VAS US CAROTID  Result Date: 08/13/2019 Carotid Arterial Duplex Study Indications:   CVA and Subacute, left superior MCA territory, possible acute                infarct in the anterior lateral left frontal lobe. Risk Factors:  None. Other Factors: CTA: critical left ICA stenosis. Performing Technologist: June Leap RDMS, RVT  Examination Guidelines: A complete evaluation includes B-mode imaging, spectral Doppler, color Doppler, and power Doppler as needed of all accessible portions of each vessel. Bilateral testing is considered an integral part of a complete examination. Limited examinations for reoccurring indications may be performed as noted.  Left Carotid Findings: +----------+--------+--------+--------+----------------------------+--------+           PSV cm/sEDV cm/sStenosisPlaque Description          Comments +----------+--------+--------+--------+----------------------------+--------+ CCA Prox  56      5                                                    +----------+--------+--------+--------+----------------------------+--------+ CCA Distal37      5                                                    +----------+--------+--------+--------+----------------------------+--------+ ICA Prox  610     268     80-99%  heterogenous and homogeneous         +----------+--------+--------+--------+----------------------------+--------+ ICA Mid   198     35                                                   +----------+--------+--------+--------+----------------------------+--------+ ICA Distal68      20                                                   +----------+--------+--------+--------+----------------------------+--------+ ECA       215      22                                                   +----------+--------+--------+--------+----------------------------+--------+ +----------+--------+--------+----------------+-------------------+  PSV cm/sEDV cm/sDescribe        Arm Pressure (mmHG) +----------+--------+--------+----------------+-------------------+ Subclavian206             Multiphasic, WNL                    +----------+--------+--------+----------------+-------------------+ +---------+--------+--+--------+--+---------+ VertebralPSV cm/s81EDV cm/s20Antegrade +---------+--------+--+--------+--+---------+   Summary:  Left Carotid: Velocities in the left ICA are consistent with a 80-99% stenosis.  *See table(s) above for measurements and observations.    Preliminary     PHYSICAL EXAM  Temp:  [98 F (36.7 C)-98.9 F (37.2 C)] 98.1 F (36.7 C) (07/01 1206) Pulse Rate:  [65-92] 88 (07/01 1206) Resp:  [17-26] 18 (07/01 1206) BP: (162-207)/(71-102) 184/71 (07/01 1206) SpO2:  [95 %-100 %] 97 % (07/01 1206) Weight:  [91.6 kg] 91.6 kg (06/30 1720)  General - Well nourished, well developed, in no apparent distress.  Ophthalmologic - fundi not visualized due to noncooperation.  Cardiovascular - irregular rhythm, tele showed sinus arrthymia.  Mental Status -  Level of arousal and orientation to year, place were intact, but difficulty with hospital name, month and age. Language exam showed able to follow simple commands, but mild to moderate expressive aphasia, able to name 2/4 and not able to repeat sentences well.  Cranial Nerves II - XII - II - right lower quadrantanopia. III, IV, VI - Extraocular movements intact. V - Facial sensation intact bilaterally. VII - right lower facial weakness. VIII - Hearing & vestibular intact bilaterally. X - Palate elevates symmetrically. XI - Chin turning & shoulder shrug intact bilaterally. XII - Tongue protrusion intact.  Motor Strength - The patient's  strength was normal in LUE and LLE, however, RUE deltoid 5/5, bicep tricep 4/5, hand grip 4/5, but difficulty with dexterity. RLE proximal 5/5, DF/PF 4+/5. Bulk was normal and fasciculations were absent.   Motor Tone - Muscle tone was assessed at the neck and appendages and was normal.  Reflexes - The patient's reflexes were symmetrical in all extremities and she had no pathological reflexes.  Sensory - Light touch, temperature/pinprick were assessed and were symmetrical.    Coordination - The patient had normal movements in the hands with no ataxia or dysmetria.  Tremor was absent.  Gait and Station - deferred.   ASSESSMENT/PLAN Lisa [name] is a 69 y.o. female who has not sought medical care for years, who smokes presenting with worsening confusion, dysphasia, subtle R motor deficits and mild R hemineglect. BP elevated.  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 Possible anterolateral L frontal lobe infarct. ASPECTS 6.    CTA head slow flow L M2 and distal L MCA vessels w/ superior and inferior division proximal L M2 branch occlusions. L M1 moderate stenosis. R M2 proximal to superior division moderate to severe focal stenosis. R A2 distal moderate focal stenosis. R P2/P3 high-grade focal stenosis. L P2/P3 moderate severe focal stenosis.  CTA neck L ICA bifurcation and proximal L ICA mixed plaque w/ near occlusion L ICA origin. R ICA bifurcation and proximal R ICA mild to moderate calcified plaque.  MRI  L MCA infarct, subacute  Carotid doppler L ICA 80-99% stenosis   2D Echo pending  LDL 158  HgbA1c 8.2  Lovenox 40 mg sq daily for VTE prophylaxis  No antithrombotic prior to admission, now on aspirin 325 mg daily and  clopidogrel 75 mg daily following load. Continue DAPT x 3 months then aspirin alone.     Therapy  recommendations:  pending   Disposition:  pending   Carotid Stenosis, Left  Carotid doppler L ICA 80-99% stenosis   CTA neck L ICA bifurcation and proximal L ICA mixed plaque w/ near occlusion L ICA origin.   VVS consulted consider left CEA  BP goal 130-160 before ICA revascularization  Multifocal intracranial stenosis  CTA head slow flow L M2 and distal L MCA vessels w/ superior and inferior division proximal L M2 branch occlusions. L M1 moderate stenosis. R M2 proximal to superior division moderate to severe focal stenosis. R A2 distal moderate focal stenosis. R P2/P3 high-grade focal stenosis. L P2/P3 moderate severe focal stenosis.  On DAPT  Long term BP goal 130-150  Avoid low BP  Hypertension  SBP as high as 218 on admission   Stablizing (180s) . Permissive hypertension (OK if < 220/120) but gradually normalize in 5-7 days . Long-term BP goal 130-150 given intracranial stenosis  Hyperlipidemia  Home meds:  No statin  Now on lipitor 80   LDL 158, goal < 70  Continue statin at discharge  Diabetes type II Uncontrolled, new dx  HgbA1c 8.2, goal < 7.0  CBGs  SSI  Management per primary team  Diabetic education  Tobacco abuse  Current smoker  Smoking cessation counseling provided  Nicotine patch   Pt is willing to quit  Other Stroke Risk Factors  Advanced age  Obesity, There is no height or weight on file to calculate BMI., recommend weight loss, diet and exercise as appropriate   Other Active Problems  Thrombocytosis, Hbg 17.8-18.0-16.7 - likely d/t cigarette smoking  Hospital day # 1  Rosalin Hawking, MD PhD Stroke Neurology 08/13/2019 12:45 PM   To contact Stroke Continuity provider, please refer to [email or web address] After hours, contact General Neurology

## 2019-08-13 NOTE — Progress Notes (Signed)
Carotid duplex       has been completed. Preliminary results can be found under CV proc through chart review. Jill Parker, BS, RDMS, RVT   

## 2019-08-13 NOTE — Hospital Course (Addendum)
Lisa [name] is a 69 yo female who presented with 5 days of confusion and difficulty speaking. Neurology was consulted. The patient denies past medical history and has not visited the doctor in years.   Ischemic CVA Patient was placed on stroke protocol at presentation and received CTA head and Neck and MRI of the Brain. Patient was outside of the TPA window.Patient was found to have subacute, left superior MCA infarct. Patient was placed on dual anti-platelet therapy. CTA of the head and neck suggested patient had significant stenosis at the origin of the left ICA.  Vascular was consulted. 7/1 patient received carotid duplex and TTE.   - Neurology recommends dual anti-platelet therapy (ASA 325 and clopidogrel 75 daily) started 6/30. DAPT x3 months and then ASA only.  - Vascular recommends revascularization of left ICA within the next 2 weeks -7/1 patient removed from Active bedrest -TTE resulted abnormal and vascular would like patient to receive TEE prior to ICA revascularization  Hypertension Permissive HTN 6/30-7/1, gradually normalize over 5-7 days Patient started on losartan (6/30) Losartan increased to 50mg  (7/2)  Hyperlipidemia Patient started on Lipitor 80mg   T2DM SS sensitive started 6/30   08/14/2018- Patient left AMA Patient was determined to go home. Patient was asked to give FPTS 20 min. To coordinate with vascular. Vascular Surgery responded to FPTS, message that the patient was not willing to stay for recommended TEE. Vascular surgery was willing to continue with ICA revascularization without the TEE recommended by Cardiology.    Recommendation at discharge - Continue DAPTx 3 months (Clopidogrel 75 with 325 ASA) followed by ASA alone - Stent to be placed by Vascular 7/8 - Follow- up with Cardiology outpatient - Follow-up with ST outpatient - PT recommended HHPT, super/ assistance 24hrs

## 2019-08-13 NOTE — Progress Notes (Signed)
  Echocardiogram 2D Echocardiogram has been performed.  Jennette Dubin 08/13/2019, 9:31 AM

## 2019-08-13 NOTE — Progress Notes (Signed)
Family Medicine Teaching Service Daily Progress Note Intern Pager: [phone]  Patient name: Lisa [name] Medical record number: [id number] Date of birth: [birth year] Age: 69 y.o. Gender: female  Primary Care Provider: Patient, No Pcp Per Consultants: Neurology Code Status: Full  Pt Overview and Major Events to Date:  Patient admitted 6/30 with generalized weakness, confusion. And abnormal speech. MRI Brain and CT Head found L MCA subacute CVA on admission.  Assessment and Plan: Lisa [name] is a 70 y.o. female presenting with 5 days of confusion and difficulty speaking.  Patient denies any past medical history.  Ischemic CVA Subacute, left superior MCA territory, possible acute infarct in the anterior lateral left frontal lobe.  Does not have any major hemiparesis or prominent dysarthria.  EKG did not show any atrial fibrillation. Patient is evaluated by neurology.  Work-up with brain MRI with and without contrast, CTA head, neck with without contrast competed 6/30. MRI Brain found acute infarct of the left MCA area. The infarct enhances suggesting subacute duration. No associated hemorrhage. CTA of the neck concluded prominent mixed plaque within the left carotid bifurcation and proximal left ICA with resultant high-grade, likely near-occlusive, stenosis at the origin of the left ICA. CTA Head reading is provided in the report. TTE is pending at this time.   She was outside the window for permissive hypertension or TPA. -TTE - Start ASA 325mg  -Neurology recommendations appreciated, recommend dual antiplatelet therapy - Continue Plavix,  decreased 300mg  to 75mg , will start 75mg  (7/2) -Cardiac monitoring -SLP/PT/OT, patient passed Swallow study (6/30)   Hypertension Initially presenting with systolic blood pressures in the 200s.  Patient was given IV labetalol which did decrease this to systolics in the 800L, discontinued 6/30 .  Patient not endorsing residual headache. - Continue  losartan 25 mg, neurology would like to maintain Permissive HTN until 7/2 -Adjust blood pressure medications as needed  Hyperlipidemia Patient lipid panel 7/1 resulted Triglycerides 164, Cholesterol 229, HDL 38, and LDL 158. Patient is also post CVA. - Continue atorvastatin 80mg  - Will restart heart healthy carb modified diet after procedures.  Type 2 DM Patient is a newly diagnosed diabetic with A1c of 8.2.  - Start patient on Sliding scale sensitive - Suggest patient start metformin outpatient  Tobacco abuse - Patient reports that she quit 08/12/2019 - Continue to monitor patient for withdrawal   Polycythemia vera Labs show elevated HGB likely secondary to chronic tobacco use. FEN/GI: NPO until SLT sees patient. PPx: Lovenox  Disposition: INPATIENT pending neurology recommendation  Subjective:  Patient reports that she is "doing okay." husband is at the bedside with the patient and their primary concern at the moment is that the patient be allowed to eat after her TTE  and cartoid duplex.  Objective: Temp:  [98 F (36.7 C)-98.9 F (37.2 C)] 98.1 F (36.7 C) (07/01 1206) Pulse Rate:  [65-92] 88 (07/01 1206) Resp:  [17-26] 18 (07/01 1206) BP: (162-207)/(71-102) 184/71 (07/01 1206) SpO2:  [95 %-100 %] 97 % (07/01 1206) Weight:  [91.6 kg] 91.6 kg (06/30 1720) Physical Exam: General: Patient is lying bed calm.  Cardiovascular: Irregular rate with regular rhythm. Respiratory: Equal to bilateral auscultation. Extremities: 5/5 strength in the LUE and LLE. 4/5 grip in the right hand, 4/5 strength in the RUE, 5/5 in RLE flexion at the hip, 4/5 RLE dorsiflexion. No edema noted, some bruising noted in RLE. Slow to follow commands concerning the RUE.  Laboratory: Recent Labs  Lab 08/12/19 1058 08/12/19 1103 08/13/19 0320  WBC 10.6*  --  8.5  HGB 17.8* 18.0* 16.7*  HCT 53.8* 53.0* 51.3*  PLT 221  --  214   Recent Labs  Lab 08/12/19 1058 08/12/19 1103 08/13/19 0320  NA  138 137 140  K 4.3 4.0 3.8  CL 102 102 103  CO2 20*  --  24  BUN 10 11 10   CREATININE 0.65 0.40* 0.59  CALCIUM 9.9  --  9.7  PROT 7.1  --   --   BILITOT 0.9  --   --   ALKPHOS 80  --   --   ALT 28  --   --   AST 26  --   --   GLUCOSE 209* 204* 157*      Imaging/Diagnostic Tests:  MRI Brain 08/12/2019 IMPRESSION: Acute infarct left MCA territory as described above. The infarct enhances suggesting subacute duration. No associated hemorrhage.  CTA Head and Neck w/ wo contrast 08/12/2019 IMPRESSION: CTA neck:  1. Prominent mixed plaque within the left carotid bifurcation and proximal left ICA. There is resultant high-grade, likely near-occlusive, stenosis at the origin of the left ICA. Carotid artery duplex is recommended to ensure some flow at this site prior to any intervention. 2. The right common carotid, right internal carotid and bilateral vertebral arteries are patent within the neck without hemodynamically significant stenosis. Mild to moderate calcified plaque within the right carotid bifurcation and proximal ICA.  CTA head:  1. Paucity of flow within M2 and more distal left MCA branch vessels with suspected age-indeterminate occlusion of superior and inferior division proximal M2 MCA branches. There is a patent M2 superior division left MCA vessel which has a site of moderate to severe stenosis in its proximal segment (series 12, image 28). 2. Sites of moderate stenosis within the M1 left middle cerebral artery. 3. Severe focal stenosis within a proximal to mid superior division right M2 MCA branch vessel. 4. Moderate/severe focal stenosis within a separate proximal to mid superior division right M2 MCA branch vessel. 5. Moderate focal stenosis within the distal A2 right anterior cerebral artery. 6. High-grade focal stenosis within the P2/P3 right PCA. 7. Moderate/severe focal stenosis within the P2/P3 left PCA.    Freida Busman, MD 08/13/2019, 1:27  PM PGY-1, [place] Intern pager: [phone], text pages welcome

## 2019-08-13 NOTE — Progress Notes (Signed)
PT Cancellation Note  Patient Details Name: Lisa [name] MRN: [id number] DOB: April 24, 1950   Cancelled Treatment:    Reason Eval/Treat Not Completed: Patient not medically ready.  Pt still on active bedrest as of afternoon.   Will see as able today or 7/2. 08/13/2019  Ginger Carne., PT Acute Rehabilitation Services [phone]  (pager) [phone]  (office)   Tessie Fass [place] 08/13/2019, 2:41 PM

## 2019-08-13 NOTE — Evaluation (Signed)
Speech Language Pathology Evaluation Patient Details Name: Lisa [name] MRN: [id number] DOB: [birth year] Today's Date: 08/13/2019 Time: 2683-4196 SLP Time Calculation (min) (ACUTE ONLY): 18 min  Problem List:  Patient Active Problem List   Diagnosis Date Noted  . CVA (cerebral vascular accident) ([place]) 08/12/2019  . Acute ischemic stroke ([place])   . Hypertension    Past Medical History: History reviewed. No pertinent past medical history. Past Surgical History: History reviewed. No pertinent surgical history. HPI:  Lisa [name] is a 69 y.o. female presenting with 5 days of confusion and difficulty speaking. Per chart patient without past medical history, although she has not seen a doctor in several decades. MRI > acute infarct left MCA territory as described above.   Assessment / Plan / Recommendation Clinical Impression  Pt's language impairments are consistent with a Broca's aphasia marked by non fluent phrases and short sentences with frequent anomia. Errors were phonemic and semantic stimulable at phonemic level. She could repeat most single words and had difficulty with mild-medium length sentences.  Comprehension of basic, one step commands/structures tasks was grossly intact with noted motor planning difficulty (arms, oral and phonation -initiating throat clear) and 66% accurate with 2 step. Cueing required to comprehend instructions frequently throughout assessment. Dysgraphia at letter and word level and reading disturbance present. In addition visual disturbance suspected and spouse reported pt has cataracts as well. Speech intelligibilty relatively intact. Continued ST recommended on acute care and inpatient rehab.     SLP Assessment  SLP Recommendation/Assessment: Patient needs continued Speech Lanaguage Pathology Services SLP Visit Diagnosis: Aphasia (R47.01)    Follow Up Recommendations  Inpatient Rehab    Frequency and Duration min 3x week  2 weeks      SLP  Evaluation Cognition  Overall Cognitive Status: Impaired/Different from baseline Arousal/Alertness: Awake/alert Orientation Level: Oriented to person;Oriented to place;Oriented to situation Attention:  (suspect difficulty with higher level) Memory:  (will continue in diagnostic treatment) Awareness: Impaired Awareness Impairment: Anticipatory impairment Problem Solving: Impaired Problem Solving Impairment: Verbal complex Safety/Judgment: Impaired       Comprehension  Auditory Comprehension Overall Auditory Comprehension: Impaired Commands: Impaired Two Step Basic Commands: 50-74% accurate (66%) Multistep Basic Commands: 0-24% accurate Visual Recognition/Discrimination Discrimination: Not tested Reading Comprehension Reading Status: Impaired Word level: Impaired Sentence Level: Impaired Interfering Components:  (visual impairments prior to stroke as well)    Expression Expression Primary Mode of Expression: Verbal Verbal Expression Overall Verbal Expression: Impaired Initiation: No impairment Level of Generative/Spontaneous Verbalization: Phrase Repetition: Impaired Level of Impairment: Sentence level Naming: Impairment Confrontation: Impaired (30% common objects) Verbal Errors: Phonemic paraphasias;Aware of errors;Other (comment) (aware of majority) Written Expression Dominant Hand: Left Written Expression: Exceptions to [facility] Copy Ability: Letter;Word Dictation Ability: Letter   Oral / Motor  Oral Motor/Sensory Function Overall Oral Motor/Sensory Function: Mild impairment Facial ROM: Reduced right;Suspected CN VII (facial) dysfunction Facial Symmetry: Abnormal symmetry right;Suspected CN VII (facial) dysfunction Facial Strength: Reduced right;Suspected CN VII (facial) dysfunction Facial Sensation: Reduced right;Suspected CN V (Trigeminal) dysfunction Motor Speech Overall Motor Speech: Impaired Respiration: Within functional limits Phonation: Normal Resonance:  Within functional limits Articulation: Within functional limitis Intelligibility: Intelligible Motor Planning: Impaired Level of Impairment: Phrase Motor Speech Errors: Inconsistent (aware of majority )   GO                    Houston Siren 08/13/2019, 3:58 PM Orbie Pyo Tu Shimmel M.Ed Risk analyst [phone] Office [phone]

## 2019-08-13 NOTE — Progress Notes (Signed)
OT Cancellation Note  Patient Details Name: Lisa [name] MRN: [id number] DOB: [birth year]   Cancelled Treatment:    Reason Eval/Treat Not Completed: Active bedrest order. Will follow and see as able.   Jolaine Artist, OT Acute Rehabilitation Services Pager [phone] Office [phone]   [name] 08/13/2019, 9:26 AM

## 2019-08-13 NOTE — Progress Notes (Signed)
  Speech Language Pathology Treatment: Cognitive-Linquistic  Patient Details Name: Lisa [name] MRN: [id number] DOB: 1950/11/06 Today's Date: 08/13/2019 Time: 5053-9767 SLP Time Calculation (min) (ACUTE ONLY): 8 min  Assessment / Plan / Recommendation Clinical Impression  Treatment of aphasia initiated following assessment with pt and spouse. Strategies for anomia introduced with examples and verbal explanation. Pt needed phonemic cues during responsive naming activity. She printed first name independently and 40% accurate with last name and cues and repeated attempts for accuracy. Partial accuracy with writing the alphabet. Pt given paper and written alphabet for independent practice. Encouraged husband to use cueing hiearchy for intended word/message during discourse.    HPI HPI: Lisa [name] is a 69 y.o. female presenting with 5 days of confusion and difficulty speaking. Per chart patient without past medical history, although she has not seen a doctor in several decades. MRI > acute infarct left MCA territory as described above.      SLP Plan  Continue with current plan of care  Patient needs continued Speech Lanaguage Pathology Services    Recommendations                   General recommendations: Rehab consult Oral Care Recommendations: Oral care BID Follow up Recommendations: Inpatient Rehab SLP Visit Diagnosis: Aphasia (R47.01) Plan: Continue with current plan of care                       Houston Siren 08/13/2019, 4:04 PM  Orbie Pyo Colvin Caroli.Ed Risk analyst [phone] Office [phone]

## 2019-08-14 ENCOUNTER — Other Ambulatory Visit: Payer: Self-pay | Admitting: Neurology

## 2019-08-14 DIAGNOSIS — I63232 Cerebral infarction due to unspecified occlusion or stenosis of left carotid arteries: Secondary | ICD-10-CM

## 2019-08-14 DIAGNOSIS — Q239 Congenital malformation of aortic and mitral valves, unspecified: Secondary | ICD-10-CM

## 2019-08-14 DIAGNOSIS — I6529 Occlusion and stenosis of unspecified carotid artery: Secondary | ICD-10-CM

## 2019-08-14 DIAGNOSIS — I779 Disorder of arteries and arterioles, unspecified: Secondary | ICD-10-CM

## 2019-08-14 DIAGNOSIS — I6522 Occlusion and stenosis of left carotid artery: Secondary | ICD-10-CM

## 2019-08-14 LAB — GLUCOSE, CAPILLARY
Glucose-Capillary: 165 mg/dL — ABNORMAL HIGH (ref 70–99)
Glucose-Capillary: 142 mg/dL — ABNORMAL HIGH (ref 70–99)

## 2019-08-14 MED ORDER — CLOPIDOGREL BISULFATE 75 MG PO TABS: 75.0000 mg | ORAL_TABLET | Freq: Every day | ORAL | 0 refills | Status: DC

## 2019-08-14 MED ORDER — METFORMIN HCL ER 500 MG PO TB24: 500.0000 mg | ORAL_TABLET | Freq: Every day | ORAL | 0 refills | Status: DC

## 2019-08-14 MED ORDER — LOSARTAN POTASSIUM 50 MG PO TABS: 50.0000 mg | ORAL_TABLET | Freq: Every day | ORAL | Status: DC

## 2019-08-14 MED ORDER — ASPIRIN 325 MG PO TBEC: 325.0000 mg | DELAYED_RELEASE_TABLET | Freq: Every day | ORAL | 0 refills | Status: DC

## 2019-08-14 MED ORDER — HYDRALAZINE HCL 25 MG PO TABS: 25.0000 mg | ORAL_TABLET | Freq: Three times a day (TID) | ORAL | Status: DC | PRN

## 2019-08-14 MED ORDER — ATORVASTATIN CALCIUM 80 MG PO TABS: 80.0000 mg | ORAL_TABLET | Freq: Every day | ORAL | 0 refills | Status: DC

## 2019-08-14 MED ORDER — LOSARTAN POTASSIUM 50 MG PO TABS: 50.0000 mg | ORAL_TABLET | Freq: Every day | ORAL | 0 refills | Status: DC

## 2019-08-14 NOTE — Evaluation (Signed)
Physical Therapy Evaluation Patient Details Name: CADEN FATICA MRN: [id number] DOB: 04/26/1950 Today's Date: 08/14/2019   History of Present Illness  69 y.o. female who presented to the ED with 5 days of confusion and difficulty speaking. Per chart patient without past medical history, although she has not seen a doctor in several decades. MRI revealed acute infarct left MCA territory.    Clinical Impression  Pt admitted with above diagnosis. PTA pt lived at home with her husband, active and independent. On eval, she required min guard assist transfers and min/min guard assist ambulation 200', HHA progressing to no AD. She presents with decreased safety awareness, decreased coordination, and decreased balance. Difficulty noted with maneuvering around obstacles on right.  Pt currently with functional limitations due to the deficits listed below (see PT Problem List). Pt will benefit from skilled PT to increase their independence and safety with mobility to allow discharge to the venue listed below.  Husband present during session and confirmed his desire for wife to return home. He reports he is able to provide needed level of assist.      Follow Up Recommendations Home health PT;Supervision/Assistance - 24 hour    Equipment Recommendations  None recommended by PT    Recommendations for Other Services       Precautions / Restrictions Precautions Precautions: Fall      Mobility  Bed Mobility Overal bed mobility: Modified Independent                Transfers Overall transfer level: Needs assistance Equipment used: None Transfers: Sit to/from Stand Sit to Stand: Min guard         General transfer comment: min guard for safety  Ambulation/Gait Ambulation/Gait assistance: Min assist;Min guard Gait Distance (Feet): 200 Feet Assistive device: 1 person hand held assist;None Gait Pattern/deviations: Step-through pattern;Decreased stride length Gait velocity:  decreased Gait velocity interpretation: <1.31 ft/sec, indicative of household ambulator General Gait Details: increased time placing R foot after swing phase relating to decreased coordination. Mildly unsteady, initially requiring HH/min assist progressing to no UE support and min guard assist. Difficulty manuevering around obstacles on right.  Stairs            Wheelchair Mobility    Modified Rankin (Stroke Patients Only)       Balance Overall balance assessment: Needs assistance Sitting-balance support: No upper extremity supported;Feet supported Sitting balance-Leahy Scale: Good Sitting balance - Comments: Pt donned socks EOB min guard assist.   Standing balance support: No upper extremity supported;Single extremity supported;During functional activity Standing balance-Leahy Scale: Fair                               Pertinent Vitals/Pain Pain Assessment: No/denies pain    Home Living Family/patient expects to be discharged to:: Private residence Living Arrangements: Spouse/significant other Available Help at Discharge: Family;Available 24 hours/day Type of Home: House Home Access: Stairs to enter Entrance Stairs-Rails: None Entrance Stairs-Number of Steps: 3 Home Layout: Two level;Bed/bath upstairs Home Equipment: Shower seat      Prior Function Level of Independence: Independent               Hand Dominance   Dominant Hand: Left    Extremity/Trunk Assessment   Upper Extremity Assessment Upper Extremity Assessment: Defer to OT evaluation    Lower Extremity Assessment Lower Extremity Assessment: Overall WFL for tasks assessed;RLE deficits/detail RLE Deficits / Details: strength intact RLE Coordination: decreased fine motor;decreased  gross motor    Cervical / Trunk Assessment Cervical / Trunk Assessment: Normal  Communication   Communication: Expressive difficulties  Cognition Arousal/Alertness: Awake/alert Behavior During  Therapy: WFL for tasks assessed/performed Overall Cognitive Status: Impaired/Different from baseline Area of Impairment: Safety/judgement;Awareness;Problem solving;Following commands                       Following Commands: Follows multi-step commands with increased time;Follows multi-step commands inconsistently Safety/Judgement: Decreased awareness of safety;Decreased awareness of deficits Awareness: Emergent Problem Solving: Difficulty sequencing;Requires verbal cues        General Comments General comments (skin integrity, edema, etc.): During amb, instructed pt to read room numbers on left then right. With both trials, pt looked at room number sign as instructed, but said "no, I can't."    Exercises     Assessment/Plan    PT Assessment Patient needs continued PT services  PT Problem List Decreased mobility;Decreased safety awareness;Decreased coordination;Decreased balance       PT Treatment Interventions Therapeutic activities;Cognitive remediation;Gait training;Therapeutic exercise;Patient/family education;Stair training;Balance training;Functional mobility training;Neuromuscular re-education    PT Goals (Current goals can be found in the Care Plan section)  Acute Rehab PT Goals Patient Stated Goal: home PT Goal Formulation: With patient/family Time For Goal Achievement: 08/28/19 Potential to Achieve Goals: Good    Frequency Min 4X/week   Barriers to discharge        Co-evaluation               AM-PAC PT "6 Clicks" Mobility  Outcome Measure Help needed turning from your back to your side while in a flat bed without using bedrails?: None Help needed moving from lying on your back to sitting on the side of a flat bed without using bedrails?: None Help needed moving to and from a bed to a chair (including a wheelchair)?: A Little Help needed standing up from a chair using your arms (e.g., wheelchair or bedside chair)?: A Little Help needed to walk  in hospital room?: A Little Help needed climbing 3-5 steps with a railing? : A Little 6 Click Score: 20    End of Session Equipment Utilized During Treatment: Gait belt Activity Tolerance: Patient tolerated treatment well Patient left: in bed;with call bell/phone within reach;with bed alarm set Nurse Communication: Mobility status PT Visit Diagnosis: Unsteadiness on feet (R26.81);Difficulty in walking, not elsewhere classified (R26.2)    Time: 2751-7001 PT Time Calculation (min) (ACUTE ONLY): 18 min   Charges:   PT Evaluation $PT Eval Moderate Complexity: 1 Mod          Lorrin Goodell, PT  Office # [phone] Pager [phone]   Lorriane Shire 08/14/2019, 10:30 AM

## 2019-08-14 NOTE — Progress Notes (Signed)
  Progress Note    08/14/2019 10:24 AM * No surgery found *  Subjective: She thinks that her speech is improving  Vitals:   08/14/19 0342 08/14/19 0832  BP: (!) 161/101 (!) 187/87  Pulse: 97 87  Resp: 17 16  Temp: 97.6 F (36.4 C) 98.2 F (36.8 C)  SpO2: 95% 95%    Physical Exam: Still has some slowness to her speech Bilateral grip strength appears even this morning Able to move both lower extremities without limitation  CBC    Component Value Date/Time   WBC 8.5 08/13/2019 0320   RBC 5.73 (H) 08/13/2019 0320   HGB 16.7 (H) 08/13/2019 0320   HCT 51.3 (H) 08/13/2019 0320   PLT 214 08/13/2019 0320   MCV 89.5 08/13/2019 0320   MCH 29.1 08/13/2019 0320   MCHC 32.6 08/13/2019 0320   RDW 14.6 08/13/2019 0320   LYMPHSABS 2.0 08/12/2019 1058   MONOABS 0.8 08/12/2019 1058   EOSABS 0.1 08/12/2019 1058   BASOSABS 0.0 08/12/2019 1058    BMET    Component Value Date/Time   NA 140 08/13/2019 0320   K 3.8 08/13/2019 0320   CL 103 08/13/2019 0320   CO2 24 08/13/2019 0320   GLUCOSE 157 (H) 08/13/2019 0320   BUN 10 08/13/2019 0320   CREATININE 0.59 08/13/2019 0320   CALCIUM 9.7 08/13/2019 0320   GFRNONAA >60 08/13/2019 0320   GFRAA >60 08/13/2019 0320    INR    Component Value Date/Time   INR 1.0 08/12/2019 1058     Intake/Output Summary (Last 24 hours) at 08/14/2019 1024 Last data filed at 08/14/2019 4103 Gross per 24 hour  Intake 510 ml  Output --  Net 510 ml     Assessment/plan:  69 y.o. female is here with left-sided hemispheric stroke.  High-grade stenosis of the left ICA.  We will plan for left sided transcarotid artery stenting next Thursday.  Patient would be okay for discharge and to be done as an outpatient she just will need to be home on aspirin, Plavix and statin.  She is also okay for discharge to rehab although the husband has been adamant against this.    Lisa [name] C. Donzetta Matters, MD [facility] Office:  [phone] Pager: [phone]  08/14/2019 10:24 AM

## 2019-08-14 NOTE — Progress Notes (Signed)
Pt insisting on leaving against medical advice. Pt educated on the importance of letting the physician clear her medically to go home the correct way. Pt adamant about leaving at this instant. Pt husband in agreement with the patient and wants to leave now. Pt husband states that the doctor was supposed to expedite the discharge process and it was not moving fast enough. IV discontinued,  MD notified and patient signed [place] paperwork.

## 2019-08-14 NOTE — Discharge Instructions (Signed)
POST-HOSPITAL & CARE INSTRUCTIONS 1. Continue Aspirin and plavix daily for 3 months. After that you will take just aspirin.  2. Go to your follow up appointments (listed below)   DOCTOR'S APPOINTMENT   Future Appointments  Date Time Provider [place]  08/19/2019  8:45 AM Barrett, Evelene Croon, PA-C [facility]     Take care and be well!  [facility]  [place], [place] [address] [phone]      Hospital Discharge After a Stroke  Being discharged from the hospital after a stroke can feel overwhelming. Many things may be different, and it is normal to feel scared or anxious. Some stroke survivors may be able to return to their homes, and others may need more specialized care on a temporary or permanent basis. Your stroke care team will work with you to develop a discharge plan that is best for you. Ask questions if you do not understand something. Invite a friend or family member to participate in discharge planning. Understanding and following your discharge plan can help to prevent another stroke or other problems. Understanding your medicines After a stroke, your health care provider may prescribe one or more types of medicine. It is important to take medicines exactly as told by your health care provider. Serious harm, such as another stroke, can happen if you are unable to take your medicine exactly as prescribed. Make sure you understand:  What medicine to take.  Why you are taking the medicine.  How and when to take it.  If it can be taken with your other medicines and herbal supplements.  Possible side effects.  When to call your health care provider if you have any side effects.  How you will get and pay for your medicines. Medical assistance programs may be able to help you pay for prescription medicines if you cannot afford them. If you are taking an  anticoagulant, be sure to take it exactly as told by your health care provider. This type of medicine can increase the risk of bleeding because it works to prevent blood from clotting. You may need to take certain precautions to prevent bleeding. You should contact your health care provider if you have:  Bleeding or bruising.  A fall or other injury to your head.  Blood in your urine or stool (feces). Planning for home safety  Take steps to prevent falls, such as installing grab bars or using a shower chair. Ask a friend or family member to get needed things in place before you go home if possible. A therapist can come to your home to make recommendations for safety equipment. Ask your health care provider if you would benefit from this service or from home care. Getting needed equipment Ask your health care provider for a list of any medical equipment and supplies you will need at home. These may include items such as:  Walkers.  Canes.  Wheelchairs.  Hand-strengthening devices.  Special eating utensils. Medical equipment can be rented or purchased, depending on your insurance coverage. Check with your insurance company about what is covered. Keeping follow-up visits After a stroke, you will need to follow up regularly with a health care provider. You may also need rehabilitation, which can include physical therapy, occupational therapy, or speech-language therapy. Keeping these appointments is very important to your recovery after a stroke. Be sure to bring your medicine list and discharge papers with you to your  appointments. If you need help to keep track of your schedule, use a calendar or appointment reminder. Preventing another stroke Having a stroke puts you at risk for another stroke in the future. Ask your health care provider what actions you can take to lower the risk. These may include:  Increasing how much you exercise.  Making a healthy eating plan.  Quitting  smoking.  Managing other health conditions, such as high blood pressure, high cholesterol, or diabetes.  Limiting alcohol use. Knowing the warning signs of a stroke  Make sure you understand the signs of a stroke. Before you leave the hospital, you will receive information outlining the stroke warning signs. Share these with your friends and family members. "BE FAST" is an easy way to remember the main warning signs of a stroke:  B - Balance. Signs are dizziness, sudden trouble walking, or loss of balance.  E - Eyes. Signs are trouble seeing or a sudden change in vision.  F - Face. Signs are sudden weakness or numbness of the face, or the face or eyelid drooping on one side.  A - Arms. Signs are weakness or numbness in an arm. This happens suddenly and usually on one side of the body.  S - Speech. Signs are sudden trouble speaking, slurred speech, or trouble understanding what people say.  T - Time. Time to call emergency services. Write down what time symptoms started. Other signs of stroke may include:  A sudden, severe headache with no known cause.  Nausea or vomiting.  Seizure. These symptoms may represent a serious problem that is an emergency. Do not wait to see if the symptoms will go away. Get medical help right away. Call your local emergency services (911 in the [place] Do not drive yourself to the hospital. Make note of the time that you had your first symptoms. Your emergency responders or emergency room staff will need to know this information. Summary  Being discharged from the hospital after a stroke can feel overwhelming. It is normal to feel scared or anxious.  Make sure you take medicines exactly as told by your health care provider.  Know the warning signs of a stroke, and get help right way if you have any of these symptoms. "BE FAST" is an easy way to remember the main warning signs of a stroke. This information is not intended to replace advice given to you  by your health care provider. Make sure you discuss any questions you have with your health care provider. Document Revised: 10/22/2018 Document Reviewed: 05/04/2016 Elsevier Patient Education  [place].

## 2019-08-14 NOTE — TOC Transition Note (Signed)
Transition of Care [facility]) - CM/SW Discharge Note   Patient Details  Name: Lisa [name] MRN: [id number] Date of Birth: 11-22-1950  [facility] CM/SW Contact:  [name], RN Phone Number: 08/14/2019, 3:55 PM   Clinical Narrative:    Pt discharged home with spouse. Recommendations for [facility] services but not ordered and pt unwilling to wait to discuss.  Spouse providing transport home.   Final next level of care: Home/Self Care Barriers to Discharge: No Barriers Identified   Patient Goals and CMS Choice        Discharge Placement                       Discharge Plan and Services                                     Social Determinants of Health (SDOH) Interventions     Readmission Risk Interventions No flowsheet data found.

## 2019-08-14 NOTE — Discharge Summary (Addendum)
[facility] Discharge Summary  Patient name: Lisa [name] Medical record number: [id number] Date of birth: 12-09-1950 Age: 69 y.o. 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 Patient presented ED by EMS as the symptoms worsened on the 5th day. Patient had no pertinent PMH as she had not been to the doctor in years.  Acute CVA Patient was placed on stroke protocol at presentation and received CTA head and Neck and MRI of the Brain. Patient was outside of the TPA window at presentation. Patient A1c, lipid panel, CMP, CBC, I-stat chem 8, APTT, CBG, PT- INR, HIV ab, Covid were all obtained at admission. Blood test confirmed that the patient had hyperlipidemia and undiagnosed T2DM.  Patient was found to have subacute, left superior MCA infarct. Per neurology Patient was kept in Permissive Hypertension until 7/2. Initially, patient was placed on 25mg  Losartan and increased to 50mg  on 7/2 with a new goal of sys < 180 beginning 7/2. Patient was started on Lipitor 80 mg 7/1.   7/1, patient was placed on dual anti-platelet therapy at the recommendation of Neurology. Physical exam was found to be abnormal with weakness of the right upper and lower extremities, right sided facial droop, and Broca's aphasia. CTA of the head and neck suggested patient had significant  stenosis at the origin of the left ICA.  Vascular was consulted. 7/1 patient received carotid duplex US and TTE. Carotid duplex US confirmed ICA stenosis 80-99%. TTE resulted abnormal and vascular would like patient to receive TEE prior to ICA revascularization; however TEE is difficult to complete outpatient. Because patient left AMA, vascular was made aware of the situation and modified the plan to allow the patient revascularization procedure to proceed without TEE. She was subsequently scheduled with cardiology outpatient 7/7 for an outpatient follow up.  Patient was seen by Speech therapy and passed her swallow test. Speech therapy evaluated patient 7/1 and recommended inpatient rehab for 2 weeks. PT evaluated the patient 7/2 and recommended HHPT; supervision/ assistance- 24 hours.  T2DM On admission A1c was 8.2. 7/1, patient was placed on  Sensitive Sliding scale insule during hospitalization and responded well requiring low levels of insulin. She was started on Metformin 500mg  daily at time of discharge.   Issues for Follow Up: Patient left AMA It was discussed with the patient during hospitalization the importance of finding a PCP who could help with HTN, Hyperlipidemia, and DM management. 1. Revascularization of the ICA with vascular- 7/9 2. TEE with Cardiology for further workup of abnormal aortic valve 3. T2DM diagnosis. Metformin XR 500mg  daily started at discharge  Significant Procedures:  7/1- VAS US Carotid Summary:     Left Carotid: Velocities in the left ICA are consistent with a 80-99%  stenosis.   7/1/- TTE FINDINGS   Left Ventricle: Left ventricular ejection fraction, by estimation, is 60  to 65%. The left ventricle has normal function. The left ventricle has no  regional wall  motion abnormalities. The left ventricular internal cavity  size was normal in size. There is   moderate concentric left ventricular hypertrophy. Left ventricular  diastolic parameters are  consistent with Grade I diastolic dysfunction  (impaired relaxation).   Right Ventricle: The right ventricular size is normal. No increase in  right ventricular wall thickness. Right ventricular systolic function is  normal. Tricuspid regurgitation signal is inadequate for assessing PA  pressure.   Left Atrium: Left atrial size was normal in size.   Right Atrium: Right atrial size was normal in size.   Pericardium: There is no evidence of pericardial effusion.   Mitral Valve: The mitral valve is grossly normal. Normal mobility of the  mitral valve leaflets. Mild mitral annular calcification. No evidence of  mitral valve regurgitation. No evidence of mitral valve stenosis.   Tricuspid Valve: The tricuspid valve is normal in structure. Tricuspid  valve regurgitation is not demonstrated. No evidence of tricuspid  stenosis.   Aortic Valve: Probable linear mobile echodensity measuring 0.6 cm on the  ventricular aspect of the aortic valve. This could represent palpillary  fibroelastoma, Lambl's excrescence, or artifact from adjacent focal  calcification on non-coronary cusp. In  setting of stroke, consider TEE if clinically indicated. The aortic valve  is abnormal. Aortic valve regurgitation is not visualized. No aortic  stenosis is present.   Pulmonic Valve: The pulmonic valve was normal in structure. Pulmonic valve  regurgitation is not visualized. No evidence of pulmonic stenosis.   Aorta: The aortic root is normal in size and structure.   Venous: The inferior vena cava is normal in size with greater than 50%  respiratory variability, suggesting right atrial pressure of 3 mmHg.   IAS/Shunts: No atrial level shunt detected by color flow Doppler.    Significant Labs and Imaging:  Recent Labs  Lab 08/12/19 1058 08/12/19 1103 08/13/19 0320  WBC 10.6*  --  8.5  HGB 17.8* 18.0* 16.7*  HCT 53.8* 53.0* 51.3*  PLT 221  --  214   Recent Labs  Lab 08/12/19 1058 08/12/19 1058  08/12/19 1103 08/13/19 0320  NA 138  --  137 140  K 4.3   < > 4.0 3.8  CL 102  --  102 103  CO2 20*  --   --  24  GLUCOSE 209*  --  204* 157*  BUN 10  --  11 10  CREATININE 0.65  --  0.40* 0.59  CALCIUM 9.9  --   --  9.7  ALKPHOS 80  --   --   --   AST 26  --   --   --   ALT 28  --   --   --   ALBUMIN 4.3  --   --   --    < > = values in this interval not displayed.    Results/Tests Pending at Time of Discharge: none  Discharge Medications:  Allergies as of 08/14/2019   No Known Allergies      Medication List     TAKE these medications    aspirin 325 MG EC tablet Take 1 tablet (325 mg total) by mouth daily. Start taking on: August 15, 2019   atorvastatin 80 MG tablet Commonly known as: LIPITOR Take 1 tablet (80 mg total) by mouth daily. Start taking on: August 15, 2019   clopidogrel 75 MG tablet Commonly known as: PLAVIX Take 1 tablet (75 mg total) by mouth daily. Start taking on: August 15, 2019   losartan  50 MG tablet Commonly known as: COZAAR Take 1 tablet (50 mg total) by mouth daily. Start taking on: August 15, 2019   metFORMIN 500 MG 24 hr tablet Commonly known as: Glucophage XR Take 1 tablet (500 mg total) by mouth daily with breakfast.   MULTIPLE VITAMIN PO Take 1 tablet by mouth daily.        Discharge Instructions: Please refer to Patient Instructions section of EMR for full details.  Patient was not fully counseled important signs and symptoms that should prompt return to medical care, changes in medications, dietary instructions, activity restrictions, and follow up appointments as patient left AMA, despite asking the patient to wait a few minutes for the care team to come speak with her.  Follow-Up Appointments:  Follow-up Information     Barrett, Evelene Croon, PA-C Follow up on 08/19/2019.   Specialties: Cardiology, Radiology Why: You have been scheduled for an 8:45am telemedicine visit to discuss further cardiac testing to complete your stroke work-up.  You will receive a call from our office ~52minutes prior to your visit to review your vital signs and medications.  Contact information: [address] [place] [address] [phone]                 Freida Busman, MD 08/14/2019, 3:02 PM PGY-1, [place]

## 2019-08-14 NOTE — Progress Notes (Signed)
Family Medicine Teaching Service Daily Progress Note Intern Pager: [phone]  Patient name: Lisa [name] Medical record number: [id number] Date of birth: Sep 05, 1950 Age: 69 y.o. Gender: female  Primary Care Provider: Patient, No Pcp Per Consultants: Neurology and Vascular Surgery Code Status: Full  Pt Overview and Major Events to Date:  6/30- Patient admitted with generalized weakness, confusion and slurred speech, diagnosed with subacute CVA infarct of the L superior MCA 7/1- Patient received Carotid duplex and TTE  Assessment and Plan: Lisa [name] a 69 y.o.femalepresenting with 5 days of confusion and difficulty speaking.Patient denies any past medical history.  Ischemic CVA Subacute, left superior MCA territory, possible acute infarct in the anterior lateral left frontal lobe. Does not have any major hemiparesis or prominent dysarthria. EKG did not show any atrial fibrillation. Patient is evaluated by neurology.Work-up with brain MRI with and without contrast, CTA head, neck with without contrast competed 6/30. MRI Brain found acute infarct of the left MCA area. The infarct enhances suggesting subacute duration. No associated hemorrhage. CTA of the neck concluded prominent mixed plaque within the left carotid bifurcation and proximal left ICA with resultant high-grade, likely near-occlusive, stenosis at the origin of the left ICA. CTA Head reading is provided in the report. TTE was completed 7/1, EF 60-65%, aortic valve was abnormal. [facility] recommends that the patient receive TEE as resulting TTE was abnormal. Patient neurology exam is still abnormal, but improving. Patient continues to exhibit signs of Broca's aphasia.   - Continue ASA 325mg  -Neurology recommend dual antiplatelet therapy, we continue to appreciate Neurology recommendations - Vascular Surgery recommends that patient follow up outpatient for revascularization of the left ICA - Continue Plavix at  75mg  -Cardiac monitoring -Per SLT, patient will require outpatient SLT - PT/OT assessments still needed   Hypertension Initially presenting with systolic blood pressures in the 200s. Patient was given IV labetalol which did decrease this to systolics in the 416S, discontinued 6/30 . Patient not endorsing residual headache. - Increase Losartan 50 mg from 25, (7/2) goal sys<180, decrease permissive HTN gradually over 5-7 days -Adjust blood pressure medications as needed  Hyperlipidemia Patient lipid panel 7/1 resulted Triglycerides 164, Cholesterol 229, HDL 38, and LDL 158. Patient is also post CVA. - Continue atorvastatin 80mg  - Continue heart healthy carb modified diet   Type 2 DM Patient is a newly diagnosed diabetic with A1c of 8.2.  - Continue Sliding scale sensitive - Suggest patient start metformin outpatient, recommend follow-up outpatient  Tobacco abuse - Patient reports that she quit 08/12/2019 - Continue to monitor patient for withdrawal   Polycythemia vera Labs show elevated HGB likely secondary to chronic tobacco abuse - 7/2- UDS screen positive for Marijuana   FEN/GI: Heart healthy/ carb modified PPx: Lovenox  Disposition: Home  Subjective:  Patient reports that is doing better and would like to go home. The patient reports that she remains adamant that she will abstain from tobacco products and understands that she needs to find a PCP. Patient did not require overnight hydralazine, as she was still under permissive hypertension protocol. Glucose remains well controlled over the past 24 hours.   Objective: Temp:  [97.6 F (36.4 C)-99.5 F (37.5 C)] 97.6 F (36.4 C) (07/02 0342) Pulse Rate:  [82-97] 97 (07/02 0342) Resp:  [17-18] 17 (07/02 0342) BP: (161-212)/(71-101) 161/101 (07/02 0342) SpO2:  [94 %-100 %] 95 % (07/02 0342) Physical Exam: General: Patient is lying in bed comfortably Cardiovascular: Regular rate and rhythm, no murmur  Respiratory:  Equal  and bilateral breath sounds to auscultation Abdomen: Normoactive bowel sounds, not pain to palpation Extremities: Distal pulses intact Neurology : 5/5 strength in the LUE and LLE and grip, 5/5 RUE, 4/5 r grip, 4/5 R plantar flexion, R 5/5 dorsiflexion, Sensation intact, CN 11 decreased function on right side, Broca's aphasia (patient could not remember how to say her name) CN 7 decreased function (unable to smile), slight facial droop on the right side  Laboratory: Recent Labs  Lab 08/12/19 1058 08/12/19 1103 08/13/19 0320  WBC 10.6*  --  8.5  HGB 17.8* 18.0* 16.7*  HCT 53.8* 53.0* 51.3*  PLT 221  --  214   Recent Labs  Lab 08/12/19 1058 08/12/19 1103 08/13/19 0320  NA 138 137 140  K 4.3 4.0 3.8  CL 102 102 103  CO2 20*  --  24  BUN 10 11 10   CREATININE 0.65 0.40* 0.59  CALCIUM 9.9  --  9.7  PROT 7.1  --   --   BILITOT 0.9  --   --   ALKPHOS 80  --   --   ALT 28  --   --   AST 26  --   --   GLUCOSE 209* 204* 157*      Imaging/Diagnostic Tests: 08/13/2019- TTE FINDINGS  Left Ventricle: Left ventricular ejection fraction, by estimation, is 60  to 65%. The left ventricle has normal function. The left ventricle has no  regional wall motion abnormalities. The left ventricular internal cavity  size was normal in size. There is  moderate concentric left ventricular hypertrophy. Left ventricular  diastolic parameters are consistent with Grade I diastolic dysfunction  (impaired relaxation).   Right Ventricle: The right ventricular size is normal. No increase in  right ventricular wall thickness. Right ventricular systolic function is  normal. Tricuspid regurgitation signal is inadequate for assessing PA  pressure.   Left Atrium: Left atrial size was normal in size.   Right Atrium: Right atrial size was normal in size.   Pericardium: There is no evidence of pericardial effusion.   Mitral Valve: The mitral valve is grossly normal. Normal mobility of the  mitral  valve leaflets. Mild mitral annular calcification. No evidence of  mitral valve regurgitation. No evidence of mitral valve stenosis.   Tricuspid Valve: The tricuspid valve is normal in structure. Tricuspid  valve regurgitation is not demonstrated. No evidence of tricuspid  stenosis.   Aortic Valve: Probable linear mobile echodensity measuring 0.6 cm on the  ventricular aspect of the aortic valve. This could represent palpillary  fibroelastoma, Lambl's excrescence, or artifact from adjacent focal  calcification on non-coronary cusp. In  setting of stroke, consider TEE if clinically indicated. The aortic valve  is abnormal. Aortic valve regurgitation is not visualized. No aortic  stenosis is present.   Pulmonic Valve: The pulmonic valve was normal in structure. Pulmonic valve  regurgitation is not visualized. No evidence of pulmonic stenosis.   Aorta: The aortic root is normal in size and structure.   Venous: The inferior vena cava is normal in size with greater than 50%  respiratory variability, suggesting right atrial pressure of 3 mmHg.   IAS/Shunts: No atrial level shunt detected by color flow Doppler.   08/13/2019- Carotid Arterial Duplex Study Summary:    Left Carotid: Velocities in the left ICA are consistent with a 80-99%  Stenosis.   Freida Busman, MD 08/14/2019, 5:55 AM PGY-1, [place] Intern pager: [phone], text pages welcome

## 2019-08-14 NOTE — Progress Notes (Addendum)
Cardiology Office Note   Date:  08/19/2019   ID:  Lisa [name], DOB 02/21/1950, MRN [id number]  PCP:  Patient, No Pcp Per Cardiologist:  Donato Heinz, MD New Electrphysiologist: None Rosaria Ferries, PA-C   No chief complaint on file.   History of Present Illness: Lisa [name] is a 69 y.o. female with a history of tob use.   Admitted dx 08/12/2019 due to CVA, high-grade L-ICA dz>>Dr Donzetta Matters to do stent as outpt. Pt left AMA on 07/02.  Lisa [name] presents for cardiology evaluation.  Has not exercised in over a year due to Mono.   She feels she is recovering well from her stroke.  She has a preop visit today and then is to have her carotid endarterectomy tomorrow.  She has stairs in her home, does not get SOB with this. She gardens and has flowers, works outside on them. That can be strenuous at times. No hx CP w/ exertion.   She denies lower extremity edema, no orthopnea or PND.  She does not have palpitations, no presyncope or syncope.  She had a fall at the time of her stroke, but has not fallen since.   Past Medical History:  Diagnosis Date  . Carotid arterial disease ([place]) 07/2019  . Diabetes ([place])   . HTN (hypertension)   . Hyperlipidemia     No past surgical history on file.  Current Outpatient Medications  Medication Sig Dispense Refill  . aspirin EC 325 MG EC tablet Take 1 tablet (325 mg total) by mouth daily. 30 tablet 0  . atorvastatin (LIPITOR) 80 MG tablet Take 1 tablet (80 mg total) by mouth daily. 30 tablet 0  . clopidogrel (PLAVIX) 75 MG tablet Take 1 tablet (75 mg total) by mouth daily. 30 tablet 0  . losartan (COZAAR) 50 MG tablet Take 1 tablet (50 mg total) by mouth daily. 30 tablet 0  . metFORMIN (GLUCOPHAGE XR) 500 MG 24 hr tablet Take 1 tablet (500 mg total) by mouth daily with breakfast. 30 tablet 0  . MULTIPLE VITAMIN PO Take 1 tablet by mouth daily.     No current facility-administered medications for this visit.      Allergies:   Patient has no known allergies.    Social History:  The patient  reports that she quit smoking about 3 weeks ago. Her smoking use included cigarettes. She smoked 0.50 packs per day. She has never used smokeless tobacco. She reports previous alcohol use. She reports that she does not use drugs.   Family History:  The patient's family history includes Hypertension in her mother; Lung cancer in her father.  She indicated that her mother is alive. She indicated that her father is deceased. She indicated that her maternal grandmother is deceased. She indicated that her maternal grandfather is deceased. She indicated that her paternal grandmother is deceased. She indicated that her paternal grandfather is deceased.   ROS:  Please see the history of present illness. All other systems are reviewed and negative.    PHYSICAL EXAM: VS:  BP (!) 158/74   Pulse (!) 105   Ht 5\' 4"  (1.626 m)   Wt 191 lb (86.6 kg)   SpO2 98%   BMI 32.79 kg/m  , BMI Body mass index is 32.79 kg/m. GEN: Well nourished, well developed, female in no acute distress HEENT: normal for age  Neck: no JVD, no carotid bruit, no masses Cardiac: RRR; 2/6 murmur, no rubs, or gallops Respiratory:  clear to auscultation bilaterally, normal work of breathing GI: soft, nontender, nondistended, + BS MS: no deformity or atrophy; no edema; distal pulses are 2+ in all 4 extremities  Skin: warm and dry, no rash, old wound on her leg started bleeding again, nursing staff bandaged and dressed it. Neuro:  Strength and sensation are intact Psych: euthymic mood, full affect   EKG:  EKG is ordered today. The ekg ordered today demonstrates sinus tachycardia, heart rate 102, regular PACs are seen  ECHO: 08/13/2019 1. Probable linear mobile echodensity measuring 0.6 cm on the ventricular  aspect of the aortic valve. 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 In setting of  stroke, consider TEE if clinically indicated. The aortic valve is  abnormal. Aortic valve regurgitation is not visualized. No aortic stenosis  is present.  2. Left ventricular ejection fraction, by estimation, is 60 to 65%. The  left ventricle has normal function. The left ventricle has no regional  wall motion abnormalities. There is moderate concentric left ventricular  hypertrophy. Left ventricular  diastolic parameters are consistent with Grade I diastolic dysfunction  (impaired relaxation).  3. Right ventricular systolic function is normal. The right ventricular  size is normal. Tricuspid regurgitation signal is inadequate for assessing  PA pressure.  4. The mitral valve is grossly normal. No evidence of mitral valve  regurgitation. No evidence of mitral stenosis.  5. The inferior vena cava is normal in size with greater than 50%  respiratory variability, suggesting right atrial pressure of 3 mmHg.   CAROTID DOPPLERS: 08/13/2019 Summary:   Left Carotid: Velocities in the left ICA are consistent with a 80-99%  stenosis.   Recent Labs: 08/12/2019: ALT 28 08/13/2019: BUN 10; Creatinine, Ser 0.59; Hemoglobin 16.7; Platelets 214; Potassium 3.8; Sodium 140  CBC    Component Value Date/Time   WBC 8.5 08/13/2019 0320   RBC 5.73 (H) 08/13/2019 0320   HGB 16.7 (H) 08/13/2019 0320   HCT 51.3 (H) 08/13/2019 0320   PLT 214 08/13/2019 0320   MCV 89.5 08/13/2019 0320   MCH 29.1 08/13/2019 0320   MCHC 32.6 08/13/2019 0320   RDW 14.6 08/13/2019 0320   LYMPHSABS 2.0 08/12/2019 1058   MONOABS 0.8 08/12/2019 1058   EOSABS 0.1 08/12/2019 1058   BASOSABS 0.0 08/12/2019 1058   CMP Latest Ref Rng & Units 08/13/2019 08/12/2019 08/12/2019  Glucose 70 - 99 mg/dL 157(H) 204(H) 209(H)  BUN 8 - 23 mg/dL 10 11 10   Creatinine 0.44 - 1.00 mg/dL 0.59 0.40(L) 0.65  Sodium 135 - 145 mmol/L 140 137 138  Potassium 3.5 - 5.1 mmol/L 3.8 4.0 4.3    Chloride 98 - 111 mmol/L 103 102 102  CO2 22 - 32 mmol/L 24 - 20(L)  Calcium 8.9 - 10.3 mg/dL 9.7 - 9.9  Total Protein 6.5 - 8.1 g/dL - - 7.1  Total Bilirubin 0.3 - 1.2 mg/dL - - 0.9  Alkaline Phos 38 - 126 U/L - - 80  AST 15 - 41 U/L - - 26  ALT 0 - 44 U/L - - 28   Lab Results  Component Value Date   HGBA1C 8.2 (H) 08/13/2019   Lipid Panel Lab Results  Component Value Date   CHOL 229 (H) 08/13/2019   HDL 38 (L) 08/13/2019   LDLCALC 158 (H) 08/13/2019   TRIG 164 (H) 08/13/2019   CHOLHDL 6.0 08/13/2019      Wt Readings from Last 3  Encounters:  08/19/19 191 lb (86.6 kg)  08/12/19 201 lb 15.1 oz (91.6 kg)     Other studies Reviewed: Additional studies/ records that were reviewed today include: Office notes, hospital records and testing.  ASSESSMENT AND PLAN: Dr. Nechama Guard was in to see the patient and approves of the plan  1.  Preoperative evaluation: -Her RCRI score is 1 for cerebrovascular disease.  That gives her a 0.9% perioperative risk of a major cardiac event -Her Duke Activity Status Index score is 31.45 giving her functional capacity and METS of 6.61. -Her EF was normal with no wall motion abnormalities on recent echo -She is at acceptable cardiovascular risk for the planned procedure with no further evaluation indicated.  2.  Carotid disease -She has a preoperative appointment today and is to have surgery tomorrow.  3.  Abnormal echo -Her echocardiogram showed a possible mobile density. -The images were reviewed carefully by Dr. Gardiner Rhyme -The original plan was for her to get a TEE while she was hospitalized, but she ended up leaving AMA. -The TEE is indicated.  Dr. Gardiner Rhyme discussed the procedure with Mr. and Mrs. Vasbinder.  They are agreeing to proceed with the TEE. -It is scheduled for this coming Monday, 07/12.  -IF OK WITH DR CAIN.  Otherwise, we will defer for now and do later.  4.  CVA -She is encouraged to continue her recovery and increase her  activity  5.  Hypertension: -Her blood pressure is elevated today, she is far enough out from her stroke that we can adjust her medications -Add amlodipine 5 mg daily>>>>after further review, I feel a BB would be a better choice. Mr Salatino was contacted, and he will keep the amlodipine but not take it.  - start metoprolol 25 mg bid  Current medicines are reviewed at length with the patient today.  The patient does not have concerns regarding medicines.  The following changes have been made:   Labs/ tests ordered today include:   Orders Placed This Encounter  Procedures  . EKG 12-Lead  . ECHOCARDIOGRAM COMPLETE     Disposition:   FU with Donato Heinz, MD  Signed, Rosaria Ferries, PA-C  08/19/2019 11:28 AM    [place] Phone: [phone]; Fax: [phone]

## 2019-08-14 NOTE — Evaluation (Signed)
Occupational Therapy Evaluation Patient Details Name: Lisa [name] MRN: [id number] DOB: 08-Oct-1950 Today's Date: 08/14/2019    History of Present Illness 69 y.o. female who presented to the ED with 5 days of confusion and difficulty speaking. Per chart patient without past medical history, although she has not seen a doctor in several decades. MRI revealed acute infarct left MCA territory.   Clinical Impression   PTA pt reports being independent with all ADLs. Pt was admitted for above and treated for problem list below (see OT Problem List). Upon arrival, pt severely agitated, wanting to go home, and had pulled out IV. Reported that she had notified nursing and husband in room and confirmed she did not inform nursing. Pt A&Ox0, could not express her name or birthday and could not read information from wristband. Requires Min guard-Min A with verbal cues for ADLs due to decrease in cognition and inattention/decreased vision to R side. Requires Min guard-Min A with transfers and ambulation with verbal cues for safety and scanning room. Noted decreased vision, needing cues to scan, and bumping into objects on R side. With pt and husband determined to go home, education given on low vision and environmental home modifications to keep a safe environment. Pt educated not to independently complete IADLs without supervision. Believe pt would benefit from skilled OT services acutely and at the [facility] level to increase participation and independence in ADLs.   Follow Up Recommendations  Home health OT;Supervision/Assistance - 24 hour    Equipment Recommendations  None recommended by OT       Precautions / Restrictions Precautions Precautions: Fall      Mobility Bed Mobility               General bed mobility comments: Pt seated in soiled chair upon entry - severely agitated  Transfers Overall transfer level: Needs assistance Equipment used: None Transfers: Sit to/from Stand Sit to  Stand: Min guard-Min Assist         General transfer comment: min guard for safety min A with LOB due to bumping into objects on R side.    Balance Overall balance assessment: Needs assistance Sitting-balance support: No upper extremity supported;Feet supported Sitting balance-Leahy Scale: Good     Standing balance support: No upper extremity supported;Single extremity supported;During functional activity Standing balance-Leahy Scale: Fair                             ADL either performed or assessed with clinical judgement   ADL Overall ADL's : Needs assistance/impaired     Grooming: Wash/dry hands;Cueing for sequencing;Standing;Min guard Grooming Details (indicate cue type and reason): Min guard with cueing for sequencing and finding objects on sink Upper Body Bathing: Cueing for sequencing;Sitting;Min guard Upper Body Bathing Details (indicate cue type and reason): Min guard for safety and Cueing for sequencing with cognition and vision Lower Body Bathing: Cueing for sequencing;Sit to/from stand;Minimal assistance Lower Body Bathing Details (indicate cue type and reason): min A with cueing for sequencing and vision Upper Body Dressing : Supervision/safety;Sitting;Cueing for sequencing Upper Body Dressing Details (indicate cue type and reason): supervision with cues for sequencing and vision Lower Body Dressing: Sit to/from stand;Cueing for sequencing;Minimal assistance Lower Body Dressing Details (indicate cue type and reason): Min A for supected R inattention with cues for sequencing and vision Toilet Transfer: Min guard;Ambulation;Grab bars;Regular Toilet;Cueing for Office manager Details (indicate cue type and reason): Min guard for safety Toileting- Clothing Manipulation  and Hygiene: Min guard;Cueing for safety;Cueing for sequencing;Sitting/lateral lean Toileting - Clothing Manipulation Details (indicate cue type and reason): min guard for safety and cues  for safety and sequencing   Tub/Shower Transfer Details (indicate cue type and reason): deferred due to pt cognition and safety Functional mobility during ADLs: Minimal assistance;Min guard;Cueing for safety;Cueing for sequencing General ADL Comments: Min guard-Min A with all ADLs. Pt noted with R inattention and decreased awareness on R side - bumping into objects on R side. Pt states she is ready to go home and is determined to go home. Pt needing max verbal cues for sequencing and safety with grooming at sink     Vision Baseline Vision/History: Wears glasses Wears Glasses: At all times Patient Visual Report: No change from baseline Vision Assessment?: Yes Eye Alignment: Within Functional Limits Ocular Range of Motion: Within Functional Limits Alignment/Gaze Preference: Within Defined Limits Tracking/Visual Pursuits: Unable to hold eye position out of midline Additional Comments: Could not assess further due to cognition and unable to follow 1-step commands            Pertinent Vitals/Pain Pain Assessment: Faces Faces Pain Scale: No hurt     Hand Dominance Left   Extremity/Trunk Assessment Upper Extremity Assessment Upper Extremity Assessment: Generalized weakness   Lower Extremity Assessment Lower Extremity Assessment: Defer to PT evaluation   Cervical / Trunk Assessment Cervical / Trunk Assessment: Normal   Communication Communication Communication: Expressive difficulties   Cognition Arousal/Alertness: Awake/alert Behavior During Therapy: Agitated;Impulsive;Restless;Anxious Overall Cognitive Status: Impaired/Different from baseline Area of Impairment: Orientation;Attention;Memory;Following commands;Safety/judgement;Awareness;Problem solving                 Orientation Level: Disoriented to;Person;Place;Time;Situation Current Attention Level: Focused Memory: Decreased short-term memory Following Commands: Follows one step commands  inconsistently Safety/Judgement: Decreased awareness of safety;Decreased awareness of deficits Awareness: Emergent Problem Solving: Difficulty sequencing;Requires verbal cues;Requires tactile cues General Comments: Pt A&Ox0 with agitation and decreased memory. Upon entering room, pt had ripped out IV. She told therapist nurse had been told about IV, but husband confirmed she did not notify the nurses. Pt also reported she did steps with PT but per PT note they did not attempt. Pt has decreased awareness of deficits and safety as she bumped into objects and could not track vision but stated she wanted to go home   General Comments  Pt severely agitated and huisband in room - both stating that she is going home today no matter what. Upon entry, pt had pulled out IV, A&Ox0, and with decreased memory. Notified nurse about IV and came to assess and bandage. Pt noted with R inattention and bumping into objects on the R side. Max verbal cues for safety, sequencing, and attending/looking to R side             Home Living Family/patient expects to be discharged to:: Private residence Living Arrangements: Spouse/significant other Available Help at Discharge: Family;Available 24 hours/day Type of Home: House Home Access: Stairs to enter [employer] of Steps: 3 Entrance Stairs-Rails: None Home Layout: Two level;Able to live on main level with bedroom/bathroom     Bathroom Shower/Tub: Occupational psychologist: Standard     Home Equipment: Shower seat   Additional Comments: Pt's husband reports will be 24/7 assist/supervision. Initially reported he was going to try and keep wife on top floor with bedroom. OT recommended first floor living situation would be most beneficial due to client not attempting steps.  Lives With: Spouse  Prior Functioning/Environment Level of Independence: Independent                 OT Problem List: Decreased strength;Impaired balance  (sitting and/or standing);Impaired vision/perception;Decreased cognition;Decreased safety awareness;Decreased knowledge of use of DME or AE;Decreased knowledge of precautions      OT Treatment/Interventions: Self-care/ADL training;Neuromuscular education;Therapeutic activities;Cognitive remediation/compensation;Visual/perceptual remediation/compensation;Patient/family education;Balance training;DME and/or AE instruction    OT Goals(Current goals can be found in the care plan section) Acute Rehab OT Goals Patient Stated Goal: home OT Goal Formulation: With patient/family Time For Goal Achievement: 08/28/19 Potential to Achieve Goals: Good  OT Frequency: Min 2X/week    AM-PAC OT "6 Clicks" Daily Activity     Outcome Measure Help from another person eating meals?: A Little Help from another person taking care of personal grooming?: A Little Help from another person toileting, which includes using toliet, bedpan, or urinal?: A Little Help from another person bathing (including washing, rinsing, drying)?: A Little Help from another person to put on and taking off regular upper body clothing?: A Little Help from another person to put on and taking off regular lower body clothing?: A Little 6 Click Score: 18   End of Session Equipment Utilized During Treatment: Gait belt Nurse Communication: Mobility status;Other (comment) (IV)  Activity Tolerance: Treatment limited secondary to agitation Patient left: in chair;with call bell/phone within reach;with family/visitor present  OT Visit Diagnosis: Unsteadiness on feet (R26.81);Muscle weakness (generalized) (M62.81);Low vision, both eyes (H54.2);Other symptoms and signs involving the nervous system (R29.898);Other symptoms and signs involving cognitive function                Time: 1349-1404 OT Time Calculation (min): 15 min Charges:  OT General Charges $OT Visit: 1 Visit OT Evaluation $OT Eval Moderate Complexity: 1 Mod  [place]/OTS  Marda Breidenbach 08/14/2019, 3:15 PM

## 2019-08-14 NOTE — Progress Notes (Signed)
STROKE TEAM PROGRESS NOTE   INTERVAL HISTORY Husband at bedside. Pt sitting in chair, stated that her speech improved. Dr. Donzetta Matters is planning to do TACR next Thursday. TTE showed possible linear mobile echodensity measuring 0.6 cm on the ventricular aspect of the aortic valve, recommend TEE. Pt wants to go home, will need outpt TEE.  Vitals:   08/13/19 2342 08/14/19 0026 08/14/19 0342 08/14/19 0832  BP: (!) 212/96 (!) 195/95 (!) 161/101 (!) 187/87  Pulse: 90  97 87  Resp: 17  17 16   Temp: 98.5 F (36.9 C)  97.6 F (36.4 C) 98.2 F (36.8 C)  TempSrc: Oral   Oral  SpO2: 94%  95% 95%  Weight:        CBC:  Recent Labs  Lab 08/12/19 1058 08/12/19 1058 08/12/19 1103 08/13/19 0320  WBC 10.6*  --   --  8.5  NEUTROABS 7.7  --   --   --   HGB 17.8*   < > 18.0* 16.7*  HCT 53.8*   < > 53.0* 51.3*  MCV 88.9  --   --  89.5  PLT 221  --   --  214   < > = values in this interval not displayed.    Basic Metabolic Panel:  Recent Labs  Lab 08/12/19 1058 08/12/19 1058 08/12/19 1103 08/13/19 0320  NA 138   < > 137 140  K 4.3   < > 4.0 3.8  CL 102   < > 102 103  CO2 20*  --   --  24  GLUCOSE 209*   < > 204* 157*  BUN 10   < > 11 10  CREATININE 0.65   < > 0.40* 0.59  CALCIUM 9.9  --   --  9.7   < > = values in this interval not displayed.   Lipid Panel:     Component Value Date/Time   CHOL 229 (H) 08/13/2019 0320   TRIG 164 (H) 08/13/2019 0320   HDL 38 (L) 08/13/2019 0320   CHOLHDL 6.0 08/13/2019 0320   VLDL 33 08/13/2019 0320   LDLCALC 158 (H) 08/13/2019 0320   HgbA1c:  Lab Results  Component Value Date   HGBA1C 8.2 (H) 08/13/2019   Urine Drug Screen:     Component Value Date/Time   LABOPIA NONE DETECTED 08/13/2019 1822   COCAINSCRNUR NONE DETECTED 08/13/2019 1822   LABBENZ NONE DETECTED 08/13/2019 1822   AMPHETMU NONE DETECTED 08/13/2019 1822   THCU POSITIVE (A) 08/13/2019 1822   LABBARB NONE DETECTED 08/13/2019 1822    Alcohol Level No results found for:  ETH  IMAGING past 24 hours No results found.  PHYSICAL EXAM    Temp:  [97.6 F (36.4 C)-99.5 F (37.5 C)] 98.2 F (36.8 C) (07/02 0832) Pulse Rate:  [82-97] 87 (07/02 0832) Resp:  [16-18] 16 (07/02 0832) BP: (161-212)/(71-101) 187/87 (07/02 0832) SpO2:  [94 %-98 %] 95 % (07/02 0832)  General - Well nourished, well developed, in no apparent distress.  Ophthalmologic - fundi not visualized due to noncooperation.  Cardiovascular - irregular rhythm, tele showed sinus arrthymia.  Mental Status -  Level of arousal and orientation to age, people and place were intact, but difficulty with time. Language exam showed able to follow simple commands, but mild expressive aphasia, able to name 3/4 and able to repeat simple sentences.  Cranial Nerves II - XII - II - right lower quadrantanopia. III, IV, VI - Extraocular movements intact. V - Facial sensation intact  bilaterally. VII - right lower facial weakness. VIII - Hearing & vestibular intact bilaterally. X - Palate elevates symmetrically. XI - Chin turning & shoulder shrug intact bilaterally. XII - Tongue protrusion intact.  Motor Strength - The patient's strength was normal in LUE and LLE, however, RUE deltoid 5/5, bicep tricep 4/5, hand grip 4/5, but difficulty with dexterity. RLE proximal 5/5, DF/PF 4+/5. Bulk was normal and fasciculations were absent.   Motor Tone - Muscle tone was assessed at the neck and appendages and was normal.  Reflexes - The patient's reflexes were symmetrical in all extremities and she had no pathological reflexes.  Sensory - Light touch, temperature/pinprick were assessed and were symmetrical.    Coordination - The patient had normal movements in the hands with no ataxia or dysmetria.  Tremor was absent.  Gait and Station - deferred.   ASSESSMENT/PLAN [name] is a 69 y.o. female who has not sought medical care for years, who smokes presenting with worsening confusion, dysphasia, subtle  R motor deficits and mild R hemineglect. BP elevated.  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 Possible anterolateral L frontal lobe infarct. ASPECTS 6.    CTA head slow flow L M2 and distal L MCA vessels w/ superior and inferior division proximal L M2 branch occlusions. L M1 moderate stenosis. R M2 proximal to superior division moderate to severe focal stenosis. R A2 distal moderate focal stenosis. R P2/P3 high-grade focal stenosis. L P2/P3 moderate severe focal stenosis.  CTA neck L ICA bifurcation and proximal L ICA mixed plaque w/ near occlusion L ICA origin. R ICA bifurcation and proximal R ICA mild to moderate calcified plaque.  MRI  L MCA infarct, subacute  Carotid doppler L ICA 80-99% stenosis   2D Echo linear mobile density 0.6cm on aortic valve. EF 60-65%   TEE recommended, pt wants to do as outpt  LDL 158  HgbA1c 8.2  UDS positive for THC  Lovenox 40 mg sq daily for VTE prophylaxis  No antithrombotic prior to admission, now on aspirin 325 mg daily and clopidogrel 75 mg daily following load. Continue DAPT x 3 months then aspirin alone.     Therapy recommendations:  HH PT, HH SLP  Disposition:  pending   Carotid Stenosis, Left  Carotid doppler L ICA 80-99% stenosis   CTA neck L ICA bifurcation and proximal L ICA mixed plaque w/ near occlusion L ICA origin.   [facility] consulted for left CAS 7/8. [place] for d/c on aspirin, plavix and statin.   BP goal 130-160 before ICA revascularization  Multifocal intracranial stenosis  CTA head slow flow L M2 and distal L MCA vessels w/ superior and inferior division proximal L M2 branch occlusions. L M1 moderate stenosis. R M2 proximal to superior division moderate to severe focal stenosis. R A2 distal moderate focal stenosis. R P2/P3 high-grade focal stenosis. L P2/P3 moderate severe focal  stenosis.  On DAPT  Long term BP goal 130-150  Avoid low BP  AV mobile density  2D Echo linear mobile density 0.6cm on aortic valve. EF 60-65%   TEE recommended  pt wants to do as outpt  Hypertension  SBP as high as 218 on admission   Stablizing (180s) . Long-term BP goal 130-150 given intracranial stenosis  Hyperlipidemia  Home meds:  No statin  Now on lipitor 80  LDL 158, goal < 70  Continue statin at discharge  Diabetes type II Uncontrolled, new dx  HgbA1c 8.2, goal < 7.0  CBGs  SSI  Management per primary team  Diabetic education  Tobacco abuse  Current smoker  Smoking cessation counseling provided  Nicotine patch   Pt is willing to quit  Other Stroke Risk Factors  Advanced age  Obesity, There is no height or weight on file to calculate BMI., recommend weight loss, diet and exercise as appropriate   UDS+ THC. Pt counseled to stop using d/t stroke risk.   Other Active Problems  Thrombocytosis, Hbg 17.8-18.0-16.7 - likely d/t cigarette smoking  Hospital day # 2  Neurology will sign off. Please call with questions. Pt will follow up with stroke clinic Dr. Leonie Man at [facility] in about 4 weeks. Thanks for the consult.   Rosalin Hawking, MD PhD Stroke Neurology 08/14/2019 11:37 AM   To contact Stroke Continuity provider, please refer to [email or web address] After hours, contact General Neurology

## 2019-08-14 NOTE — TOC CAGE-AID Note (Signed)
Transition of Care [facility]) - CAGE-AID Screening   Patient Details  Name: Lisa [name] MRN: [id number] Date of Birth: 04-Jul-1950  Transition [facility]) CM/SW Contact:    Emeterio Reeve, [place] Phone Number: 08/14/2019, 2:05 PM   Clinical Narrative:  CSW met with pt at bedside. CSW introduced self and explained her role at the hospital. Pt reported 2-3 beers most days of the week. Pt reports occasional THC use. Pt states she knows she needs to cut back and believes she can do that easily. Pt was receptive to counseling and resources.   CAGE-AID Screening:    Have You Ever Felt You Ought to Cut Down on Your Drinking or Drug Use?: Yes Have People Annoyed You By Critizing Your Drinking Or Drug Use?: No Have You Felt Bad Or Guilty About Your Drinking Or Drug Use?: Yes Have You Ever Had a Drink or Used Drugs First Thing In The Morning to STeady Your Nerves or to Get Rid of a Hangover?: No CAGE-AID Score: 2  Substance Abuse Education Offered: Yes  Substance abuse interventions: Patient Counseling, Educational Materials  Emeterio Reeve, Latanya Presser, [place] Social Worker [phone]

## 2019-08-14 NOTE — Progress Notes (Signed)
MEWS was yellow post BP being high. Discussed parameters with MD and will follow plan

## 2019-08-15 ENCOUNTER — Telehealth: Payer: Self-pay | Admitting: Student in an Organized Health Care Education/Training Program

## 2019-08-15 NOTE — Telephone Encounter (Signed)
Spoke with patient's husband. Spoke about details not covered with patient after leaving AMA without receiving hospital summary call detailed upcoming appts with cardiology and vascular surgery. We also let her know about the medications prescribed that should be ready for pick-up at her pharmacy for T2DM and HTN. 7/7- [facility] at 8:45 AM  7/8 Surgery

## 2019-08-18 ENCOUNTER — Other Ambulatory Visit: Payer: Self-pay

## 2019-08-19 ENCOUNTER — Other Ambulatory Visit (HOSPITAL_COMMUNITY)
Admission: RE | Admit: 2019-08-19 | Discharge: 2019-08-19 | Disposition: A | Discharge status: Home / Self Care | Payer: Medicare Other | Source: Ambulatory Visit | Attending: Vascular Surgery | Admitting: Vascular Surgery

## 2019-08-19 ENCOUNTER — Encounter (HOSPITAL_COMMUNITY)
Admission: RE | Admit: 2019-08-19 | Discharge: 2019-08-19 | Disposition: A | Discharge status: Home / Self Care | Payer: Medicare Other | Source: Ambulatory Visit | Attending: Vascular Surgery | Admitting: Vascular Surgery

## 2019-08-19 ENCOUNTER — Encounter (HOSPITAL_COMMUNITY): Payer: Self-pay

## 2019-08-19 ENCOUNTER — Encounter: Payer: Self-pay | Admitting: Physician Assistant

## 2019-08-19 ENCOUNTER — Ambulatory Visit: Payer: Medicare Other | Admitting: Physician Assistant

## 2019-08-19 ENCOUNTER — Other Ambulatory Visit: Payer: Self-pay

## 2019-08-19 VITALS — BP 158/74 | HR 105 | Ht 64.0 in | Wt 191.0 lb

## 2019-08-19 DIAGNOSIS — E119 Type 2 diabetes mellitus without complications: Secondary | ICD-10-CM | POA: Diagnosis not present

## 2019-08-19 DIAGNOSIS — I6521 Occlusion and stenosis of right carotid artery: Secondary | ICD-10-CM

## 2019-08-19 DIAGNOSIS — R931 Abnormal findings on diagnostic imaging of heart and coronary circulation: Secondary | ICD-10-CM

## 2019-08-19 DIAGNOSIS — E785 Hyperlipidemia, unspecified: Secondary | ICD-10-CM | POA: Diagnosis not present

## 2019-08-19 DIAGNOSIS — I6522 Occlusion and stenosis of left carotid artery: Secondary | ICD-10-CM | POA: Diagnosis not present

## 2019-08-19 DIAGNOSIS — Z01810 Encounter for preprocedural cardiovascular examination: Secondary | ICD-10-CM | POA: Diagnosis not present

## 2019-08-19 DIAGNOSIS — I63512 Cerebral infarction due to unspecified occlusion or stenosis of left middle cerebral artery: Secondary | ICD-10-CM | POA: Diagnosis not present

## 2019-08-19 DIAGNOSIS — I69331 Monoplegia of upper limb following cerebral infarction affecting right dominant side: Secondary | ICD-10-CM | POA: Diagnosis not present

## 2019-08-19 DIAGNOSIS — I69311 Memory deficit following cerebral infarction: Secondary | ICD-10-CM | POA: Diagnosis not present

## 2019-08-19 DIAGNOSIS — Z87891 Personal history of nicotine dependence: Secondary | ICD-10-CM | POA: Diagnosis not present

## 2019-08-19 DIAGNOSIS — Z79899 Other long term (current) drug therapy: Secondary | ICD-10-CM | POA: Diagnosis not present

## 2019-08-19 DIAGNOSIS — Z7902 Long term (current) use of antithrombotics/antiplatelets: Secondary | ICD-10-CM | POA: Diagnosis not present

## 2019-08-19 DIAGNOSIS — I10 Essential (primary) hypertension: Secondary | ICD-10-CM | POA: Diagnosis not present

## 2019-08-19 DIAGNOSIS — Z20822 Contact with and (suspected) exposure to covid-19: Secondary | ICD-10-CM | POA: Diagnosis not present

## 2019-08-19 DIAGNOSIS — I69328 Other speech and language deficits following cerebral infarction: Secondary | ICD-10-CM | POA: Diagnosis not present

## 2019-08-19 DIAGNOSIS — I444 Left anterior fascicular block: Secondary | ICD-10-CM | POA: Diagnosis not present

## 2019-08-19 DIAGNOSIS — Z7982 Long term (current) use of aspirin: Secondary | ICD-10-CM | POA: Diagnosis not present

## 2019-08-19 HISTORY — DX: Cerebral infarction, unspecified: I63.9

## 2019-08-19 LAB — COMPREHENSIVE METABOLIC PANEL
ALT: 38 U/L (ref 0–44)
AST: 39 U/L (ref 15–41)
Albumin: 4.2 g/dL (ref 3.5–5.0)
Alkaline Phosphatase: 83 U/L (ref 38–126)
Anion gap: 15 (ref 5–15)
BUN: 12 mg/dL (ref 8–23)
CO2: 22 mmol/L (ref 22–32)
Calcium: 9.7 mg/dL (ref 8.9–10.3)
Chloride: 100 mmol/L (ref 98–111)
Creatinine, Ser: 0.71 mg/dL (ref 0.44–1.00)
GFR calc Af Amer: >60 mL/min (ref >60)
GFR calc non Af Amer: >60 mL/min (ref >60)
Glucose, Bld: 168 mg/dL — ABNORMAL HIGH (ref 70–99)
Potassium: 3.4 mmol/L — ABNORMAL LOW (ref 3.5–5.1)
Sodium: 137 mmol/L (ref 135–145)
Total Bilirubin: 0.8 mg/dL (ref 0.3–1.2)
Total Protein: 7 g/dL (ref 6.5–8.1)

## 2019-08-19 LAB — CBC
HCT: 52.3 % — ABNORMAL HIGH (ref 36.0–46.0)
Hemoglobin: 17.2 g/dL — ABNORMAL HIGH (ref 12.0–15.0)
MCH: 29.5 pg (ref 26.0–34.0)
MCHC: 32.9 g/dL (ref 30.0–36.0)
MCV: 89.7 fL (ref 80.0–100.0)
Platelets: 223 10*3/uL (ref 150–400)
RBC: 5.83 MIL/uL — ABNORMAL HIGH (ref 3.87–5.11)
RDW: 14 % (ref 11.5–15.5)
WBC: 12.5 10*3/uL — ABNORMAL HIGH (ref 4.0–10.5)
nRBC: 0 % (ref 0.0–0.2)

## 2019-08-19 LAB — SARS CORONAVIRUS 2 (TAT 6-24 HRS): SARS Coronavirus 2: NEGATIVE

## 2019-08-19 LAB — PROTIME-INR
INR: 1 (ref 0.8–1.2)
Prothrombin Time: 13 seconds (ref 11.4–15.2)

## 2019-08-19 LAB — SURGICAL PCR SCREEN
MRSA, PCR: NEGATIVE
Staphylococcus aureus: NEGATIVE

## 2019-08-19 LAB — APTT: aPTT: 33 seconds (ref 24–36)

## 2019-08-19 MED ORDER — METOPROLOL TARTRATE 25 MG PO TABS
25.0000 mg | ORAL_TABLET | Freq: Two times a day (BID) | ORAL | 3 refills | Status: DC
Start: 2019-08-19 — End: 2019-10-03

## 2019-08-19 MED ORDER — AMLODIPINE BESYLATE 5 MG PO TABS
5.0000 mg | ORAL_TABLET | Freq: Every day | ORAL | 2 refills | Status: DC
Start: 2019-08-19 — End: 2019-09-07

## 2019-08-19 NOTE — Progress Notes (Addendum)
Anesthesia Chart Review:  Case: [id number] Date/Time: 08/20/19 1249   Procedure: LEFT TRANSCAROTID ARTERY REVASCULARIZATION (Left )   Anesthesia type: General   Pre-op diagnosis: CAROTID STENOSIS, LEFT   Location: [facility] OR ROOM 16 / [facility]   Surgeons: Waynetta Sandy, MD      DISCUSSION: Patient is a 69 year old female scheduled for the above procedure.   History includes former smoker (quit 07/28/19), HTN, HLD, DM2, carotid artery stenosis, left MCA infarct (acute/subacute 08/12/19). BMI is consistent with obesity. - Admission 08/12/19- for worsening confusion for previous 5 days. On arrival to ED also with poor attention and dysphasia. CT showed acute/subacute left MCA CVA. CTA showed near occlusive proximal LICA lesion and thought to be the source of her infarct. She was started on ASA and Plavix. Of note, TTE on 08/13/19 showed a probable linear mobile echodensity measuring 0.6 cm on the ventricular aspect of the aortic valve (DDx included palpillary fibroelastoma, Lambl's excrescence, or artifact from adjacent focal calcification on non-coronary cusp). In setting of stroke, TEE was recommended. Also ST recommended CIR consult, however, patient did not want to go to rehab and did not want to wait and schedule in-patient TEE, so she left AMA. By notes, condition was improving however, and she passed her swallow test. She was also started on metformin for new diagnosis of DM (A1c 8.2%). Out-patient cardiology follow-up arranged.   She had cardiology evaluation by Lenoard Aden and Oswaldo Milian, MD on 08/19/19. Dr. Gardiner Rhyme reviewed TTE images. Per note and my follow-up telephone call with Suanne Marker, patient will need a TEE, but does not have to occur prior to surgery unless preference by Dr. Donzetta Matters (currently TEE scheduled for 08/24/19 unless vascular wants to delay further following TCAR). (Reportedly, Suanne Marker has forwarded her note to [facility], and I also reached out to [facility] asking that staff  verify that Dr. Donzetta Matters had reviewed cardiology notes.) Cardiology note also outlines Preoperative evaluation: -Her RCRI score is 1 for cerebrovascular disease.  That gives her a 0.9% perioperative risk of a major cardiac event -Her Duke Activity Status Index score is 31.45 giving her functional capacity and METS of 6.61. -Her EF was normal with no wall motion abnormalities on recent echo -She is at acceptable cardiovascular risk for the planned procedure with no further evaluation indicated.  - UPDATE 08/19/19 4:43 PM: Received a call from on-call vascular surgeon Theotis Burrow, MD. He confirmed with Dr. Donzetta Matters that he had reviewed cardiology records. Plan to proceed with TCAR as scheduled without TEE which will be done at a later date.  She is continuing ASA and Plavix.  08/19/2019 presurgical COVID-19 test in process. Reviewed echo and cardiology input with anesthesiologist Oren Bracket, MD. Anesthesia team to evaluate on the day of surgery.  She was not able to provide a urine specimen for urinalysis at PAT so this will need to be done on the day of surgery per surgeon orders.    VS: BP (!) 186/88   Pulse (!) 104   Temp 36.9 C (Oral)   Resp 18   Ht 5\' 4"  (1.626 m)   Wt 87.3 kg   SpO2 100%   BMI 33.03 kg/m    PROVIDERS: Patient, No Pcp Per - Plans to get established with Harlene Ramus, MD. Seen by neurologists Kerney Elbe, MD and Rosalin Hawking, MD during CVA admission   LABS: Labs reviewed: Acceptable for surgery. A1c 8.2% on 08/13/19.  (all labs ordered are listed, but only abnormal results  are displayed)  Labs Reviewed  CBC - Abnormal; Notable for the following components:      Result Value   WBC 12.5 (*)    RBC 5.83 (*)    Hemoglobin 17.2 (*)    HCT 52.3 (*)    All other components within normal limits  COMPREHENSIVE METABOLIC PANEL - Abnormal; Notable for the following components:   Potassium 3.4 (*)    Glucose, Bld 168 (*)    All other components within normal  limits  SURGICAL PCR SCREEN  APTT  PROTIME-INR  TYPE AND SCREEN     IMAGES: CTA head/neck 08/12/19: IMPRESSION: CTA neck: 1. Prominent mixed plaque within the left carotid bifurcation and proximal left ICA. There is resultant high-grade, likely near-occlusive, stenosis at the origin of the left ICA. Carotid artery duplex is recommended to ensure some flow at this site prior to any intervention. 2. The right common carotid, right internal carotid and bilateral vertebral arteries are patent within the neck without hemodynamically significant stenosis. Mild to moderate calcified plaque within the right carotid bifurcation and proximal ICA. CTA head: 1. Paucity of flow within M2 and more distal left MCA branch vessels with suspected age-indeterminate occlusion of superior and inferior division proximal M2 MCA branches. There is a patent M2 superior division left MCA vessel which has a site of moderate to severe stenosis in its proximal segment (series 12, image 28). 2. Sites of moderate stenosis within the M1 left middle cerebral artery. 3. Severe focal stenosis within a proximal to mid superior division right M2 MCA branch vessel. 4. Moderate/severe focal stenosis within a separate proximal to mid superior division right M2 MCA branch vessel. 5. Moderate focal stenosis within the distal A2 right anterior cerebral artery. 6. High-grade focal stenosis within the P2/P3 right PCA. 7. Moderate/severe focal stenosis within the P2/P3 left PCA.  MRI Brain 08/12/19: IMPRESSION: Acute infarct left MCA territory as described above. The infarct enhances suggesting subacute duration. No associated hemorrhage.  CT Head 08/12/19: IMPRESSION: Subacute appearing infarct in the left superior division MCA territory. There may be more acute infarct in the anterolateral left frontal lobe. ASPECTS is 6 at best.   EKG: 08/12/19:  Sinus tachycardia with irregular rate at 111 bpm LAD, consider  left anterior fascicular block Anteroseptal infarct, age indeterminate Confirmed by Davonna Belling [phone]) on 08/12/2019 2:20:45 PM   CV: Echo (TTE) 08/13/19: IMPRESSIONS   1. Probable linear mobile echodensity measuring 0.6 cm on the ventricular  aspect of the aortic valve. This could represent palpillary fibroelastoma,  Lambl's excrescence, or artifact from adjacent focal calcification on  non-coronary cusp. In setting of  stroke, consider TEE if clinically indicated. The aortic valve is  abnormal. Aortic valve regurgitation is not visualized. No aortic stenosis  is present.  2. Left ventricular ejection fraction, by estimation, is 60 to 65%. The  left ventricle has normal function. The left ventricle has no regional  wall motion abnormalities. There is moderate concentric left ventricular  hypertrophy. Left ventricular  diastolic parameters are consistent with Grade I diastolic dysfunction  (impaired relaxation).  3. Right ventricular systolic function is normal. The right ventricular  size is normal. Tricuspid regurgitation signal is inadequate for assessing  PA pressure.  4. The mitral valve is grossly normal. No evidence of mitral valve  regurgitation. No evidence of mitral stenosis.  5. The inferior vena cava is normal in size with greater than 50%  respiratory variability, suggesting right atrial pressure of 3 mmHg.    Left  Carotid US 08/13/19: Summary:  Left Carotid: Velocities in the left ICA are consistent with a 80-99%  stenosis.  (Right Carotid by 08/12/19 CTA: The right common carotid, right internal carotid and bilateral vertebral arteries are patent within the neck without hemodynamically significant stenosis. Mild to moderate calcified plaque within the right carotid bifurcation and proximal ICA.)   Past Medical History:  Diagnosis Date  . Carotid arterial disease ([place]) 07/2019  . Diabetes ([place])   . HTN (hypertension)   . Hyperlipidemia   . Stroke [facility])     acute/subacute left MCA infarct 08/12/19    No past surgical history on file.  MEDICATIONS: . amLODipine (NORVASC) 5 MG tablet  . aspirin EC 325 MG EC tablet  . atorvastatin (LIPITOR) 80 MG tablet  . clopidogrel (PLAVIX) 75 MG tablet  . losartan (COZAAR) 50 MG tablet  . metFORMIN (GLUCOPHAGE XR) 500 MG 24 hr tablet  . metoprolol tartrate (LOPRESSOR) 25 MG tablet  . MULTIPLE VITAMIN PO   No current facility-administered medications for this encounter.    Myra Gianotti, PA-C Surgical Short Stay/Anesthesiology [facility] Phone [phone] [facility] Phone [phone] 08/19/2019 4:31 PM

## 2019-08-19 NOTE — Progress Notes (Signed)
[facility] [place], [place] - [address] [place] [address] Phone: [phone] Fax: [phone]    Your procedure is scheduled on Thursday, July 8th.  Report to [facility] Main Entrance "A" at 1100 A.M., and check in at the Admitting office.  Call this number if you have problems the morning of surgery:  [phone]  Call [phone] if you have any questions prior to your surgery date Monday-Friday 8am-4pm   Remember:  Do not eat after midnight the night before your surgery  You may drink clear liquids until 10:00 A.M. the morning of your surgery.   Clear liquids allowed are: Water, Non-Citrus Juices (without pulp), Carbonated Beverages, Clear Tea, Black Coffee Only, and Gatorade   Take these medicines the morning of surgery with A SIP OF WATER  atorvastatin (LIPITOR)  Follow your surgeon's instructions on when to stop Aspirin and PLAVIX.  If no instructions were given by your surgeon then you will need to call the office to get those instructions.    As of today, STOP taking Aleve, Naproxen, Ibuprofen, Motrin, Advil, Goody's, BC's, all herbal medications, fish oil, and all vitamins.          WHAT DO I DO ABOUT MY DIABETES MEDICATION?  - The morning of surgery  Do NOT take metFORMIN (GLUCOPHAGE XR)  HOW TO MANAGE YOUR DIABETES BEFORE AND AFTER SURGERY  Why is it important to control my blood sugar before and after surgery? . Improving blood sugar levels before and after surgery helps healing and can limit problems. . A way of improving blood sugar control is eating a healthy diet by: o  Eating less sugar and carbohydrates o  Increasing activity/exercise o  Talking with your doctor about reaching your blood sugar goals . High blood sugars (greater than 180 mg/dL) can raise your risk of infections and slow your recovery, so you will need to focus on controlling your diabetes during the  weeks before surgery. . Make sure that the doctor who takes care of your diabetes knows about your planned surgery including the date and location.  How do I manage my blood sugar before surgery? . Check your blood sugar at least 4 times a day, starting 2 days before surgery, to make sure that the level is not too high or low. . Check your blood sugar the morning of your surgery when you wake up and every 2 hours until you get to the Short Stay unit. o If your blood sugar is less than 70 mg/dL, you will need to treat for low blood sugar: - Do not take insulin. - Treat a low blood sugar (less than 70 mg/dL) with  cup of clear juice (cranberry or apple), 4 glucose tablets, OR glucose gel. - Recheck blood sugar in 15 minutes after treatment (to make sure it is greater than 70 mg/dL). If your blood sugar is not greater than 70 mg/dL on recheck, call [phone] for further instructions. . Report your blood sugar to the short stay nurse when you get to Short Stay.  . If you are admitted to the hospital after surgery: o Your blood sugar will be checked by the staff and you will probably be given insulin after surgery (instead of oral diabetes medicines) to make sure you have good blood sugar levels. o The goal for blood sugar control after surgery is 80-180 mg/dL.  Do not wear jewelry, make up, or nail polish            Do not wear lotions, powders, perfumes/colognes, or deodorant.            Do not shave 48 hours prior to surgery.  Men may shave face and neck.            Do not bring valuables to the hospital.            [facility] is not responsible for any belongings or valuables.  Do NOT Smoke (Tobacco/Vaping) or drink Alcohol 24 hours prior to your procedure If you use a CPAP at night, you may bring all equipment for your overnight stay.   Contacts, glasses, dentures or bridgework may not be worn into surgery.      For patients admitted to the hospital, discharge time will be  determined by your treatment team.   Patients discharged the day of surgery will not be allowed to drive home, and someone needs to stay with them for 24 hours.  Special instructions:   - Preparing For Surgery  Before surgery, you can play an important role. Because skin is not sterile, your skin needs to be as free of germs as possible. You can reduce the number of germs on your skin by washing with CHG (chlorahexidine gluconate) Soap before surgery.  CHG is an antiseptic cleaner which kills germs and bonds with the skin to continue killing germs even after washing.    Oral Hygiene is also important to reduce your risk of infection.  Remember - BRUSH YOUR TEETH THE MORNING OF SURGERY WITH YOUR REGULAR TOOTHPASTE  Please do not use if you have an allergy to CHG or antibacterial soaps. If your skin becomes reddened/irritated stop using the CHG.  Do not shave (including legs and underarms) for at least 48 hours prior to first CHG shower. It is OK to shave your face.  Please follow these instructions carefully.   1. Shower the NIGHT BEFORE SURGERY and the MORNING OF SURGERY with CHG Soap.   2. If you chose to wash your hair, wash your hair first as usual with your normal shampoo.  3. After you shampoo, rinse your hair and body thoroughly to remove the shampoo.  4. Use CHG as you would any other liquid soap. You can apply CHG directly to the skin and wash gently with a scrungie or a clean washcloth.   5. Apply the CHG Soap to your body ONLY FROM THE NECK DOWN.  Do not use on open wounds or open sores. Avoid contact with your eyes, ears, mouth and genitals (private parts). Wash Face and genitals (private parts)  with your normal soap.   6. Wash thoroughly, paying special attention to the area where your surgery will be performed.  7. Thoroughly rinse your body with warm water from the neck down.  8. DO NOT shower/wash with your normal soap after using and rinsing off the CHG  Soap.  9. Pat yourself dry with a CLEAN TOWEL.  10. Wear CLEAN PAJAMAS to bed the night before surgery  11. Place CLEAN SHEETS on your bed the night of your first shower and DO NOT SLEEP WITH PETS.   Day of Surgery: Wear Clean/Comfortable clothing the morning of surgery Do not apply any deodorants/lotions.   Remember to brush your teeth WITH YOUR REGULAR TOOTHPASTE.   Please read over the following fact sheets that you were given.

## 2019-08-19 NOTE — Patient Instructions (Signed)
Medication Instructions:  Start Amlodipine 5 mg 1 Tablet Daily *If you need a refill on your cardiac medications before your next appointment, please call your pharmacy*   Lab Work: None If you have labs (blood work) drawn today and your tests are completely normal, you will receive your results only by: Marland Kitchen MyChart Message (if you have MyChart) OR . A paper copy in the mail If you have any lab test that is abnormal or we need to change your treatment, we will call you to review the results.       Follow-Up: At [facility], you and your health needs are our priority.  As part of our continuing mission to provide you with exceptional heart care, we have created designated Provider Care Teams.  These Care Teams include your primary Cardiologist (physician) and Advanced Practice Providers (APPs -  Physician Assistants and Nurse Practitioners) who all work together to provide you with the care you need, when you need it.  We recommend signing up for the patient portal called "MyChart".  Sign up information is provided on this After Visit Summary.  MyChart is used to connect with patients for Virtual Visits (Telemedicine).  Patients are able to view lab/test results, encounter notes, upcoming appointments, etc.  Non-urgent messages can be sent to your provider as well.   To learn more about what you can do with MyChart, go to [email or web address].    Your next appointment:   1 month(s)  The format for your next appointment:   In Person  Provider:   Oswaldo Milian, MD   Other Instructions  Dear Nadara Eaton You are scheduled for a TEE on 08/24/2019 with Dr. Harrington Challenger  Please arrive at the [facility] (Main Entrance A) at [facility]: [place], [place] [address] at 7:45 am. (1 hour prior to procedure unless lab work is needed; if lab work is needed arrive 1.5 hours ahead)  DIET: Nothing to eat or drink after midnight except a sip of water with medications  (see medication instructions below)  Medication Instructions:   Continue your anticoagulant:  You will need to continue your anticoagulant after your procedure until you  are told by your  Provider that it is safe to stop   Labs: If patient is on Coumadin, patient needs pt/INR, CBC, BMET within 3 days (No pt/INR needed for patients taking Xarelto, Eliquis, Pradaxa) For patients receiving anesthesia for TEE and all Cardioversion patients: BMET, CBC within 1 week  Come to:  (Lab option #1) Come to the lab at [facility] between the hours of 8:00 am and 4:30 pm. You do not have to be fasting. (Lab option #2) Lab at an alternate location (Lab option #3) your lab work will be done at the hospital prior to your procedure - you will need to arrive 1  hours ahead of your procedure  You must have a responsible person to drive you home and stay in the waiting area during your procedure. Failure to do so could result in cancellation.  Bring your insurance cards.  *Special Note: Every effort is made to have your procedure done on time. Occasionally there are emergencies that occur at the hospital that may cause delays. Please be patient if a delay does occur.

## 2019-08-19 NOTE — Anesthesia Preprocedure Evaluation (Addendum)
Anesthesia Evaluation  Patient identified by MRN, date of birth, ID band Patient awake    Reviewed: Allergy & Precautions, NPO status , Patient's Chart, lab work & pertinent test results  Airway Mallampati: II  TM Distance: >3 FB Neck ROM: Full    Dental  (+) Dental Advisory Given   Pulmonary former smoker,    breath sounds clear to auscultation       Cardiovascular hypertension, Pt. on medications  Rhythm:Regular Rate:Normal     Neuro/Psych CVA    GI/Hepatic negative GI ROS, Neg liver ROS,   Endo/Other  diabetes, Type 2  Renal/GU negative Renal ROS     Musculoskeletal   Abdominal   Peds  Hematology negative hematology ROS (+)   Anesthesia Other Findings   Reproductive/Obstetrics                            Anesthesia Physical Anesthesia Plan  ASA: III  Anesthesia Plan: General   Post-op Pain Management:    Induction: Intravenous  PONV Risk Score and Plan: 3 and Dexamethasone, Ondansetron and Treatment may vary due to age or medical condition  Airway Management Planned: Oral ETT  Additional Equipment: Arterial line  Intra-op Plan:   Post-operative Plan: Extubation in OR  Informed Consent: I have reviewed the patients History and Physical, chart, labs and discussed the procedure including the risks, benefits and alternatives for the proposed anesthesia with the patient or authorized representative who has indicated his/her understanding and acceptance.     Dental advisory given  Plan Discussed with: CRNA  Anesthesia Plan Comments: ( )       Anesthesia Quick Evaluation

## 2019-08-19 NOTE — Progress Notes (Signed)
PCP - Per patient and her husband, pt does not currently have a PCP. Plan is to see Milagros Evener, MD in the near future. Cardiologist - Denies  PPM/ICD - Denies  Chest x-ray - N/A EKG - 08/12/19 Stress Test - Denies ECHO - 08/19/19 Cardiac Cath - Denies  Sleep Study - Denies  Per patient's husband, pt was recently diagnosed with DM during admission 06/21. A1C was 8.2. Patient in the process of obtaining CBG monitor.  Blood Thinner Instructions: Per Dr. Claretha Cooper office, continue clopidogrel (PLAVIX)  Aspirin Instructions: Per Dr. Claretha Cooper office, continue ASA.   ERAS Protcol - N/A  COVID TEST- 08/19/19   Anesthesia review: Yes, recent hospitalization 08/12/19 for stroke; review ECHO; abnormal EKG  Patient denies shortness of breath, fever, cough and chest pain at PAT appointment   All instructions explained to the patient, with a verbal understanding of the material. Patient agrees to go over the instructions while at home for a better understanding. Patient also instructed to self quarantine after being tested for COVID-19. The opportunity to ask questions was provided.

## 2019-08-19 NOTE — Progress Notes (Signed)
[facility] [place], [place] - [address] OF [address] [place]  [address] Phone: [phone] Fax: [phone]    Your procedure is scheduled on Thursday, July 8th.  Report to [facility] Main Entrance "A" at 1100 A.M., and check in at the Admitting office.  Call this number if you have problems the morning of surgery:  [phone]  Call [phone] if you have any questions prior to your surgery date Monday-Friday 8am-4pm   Remember:  Do not eat or drink after midnight the night before your surgery.    Take these medicines the morning of surgery with A SIP OF WATER  atorvastatin (LIPITOR) amLODipine (NORVASC) metoprolol tartrate (LOPRESSOR)  Follow your surgeon's instructions on when to stop Aspirin and PLAVIX.  If no instructions were given by your surgeon then you will need to call the office to get those instructions.    As of today, STOP taking Aleve, Naproxen, Ibuprofen, Motrin, Advil, Goody's, BC's, all herbal medications, fish oil, and all vitamins.          WHAT DO I DO ABOUT MY DIABETES MEDICATION?  - The morning of surgery  Do NOT take metFORMIN (GLUCOPHAGE XR)  HOW TO MANAGE YOUR DIABETES BEFORE AND AFTER SURGERY  Why is it important to control my blood sugar before and after surgery? . Improving blood sugar levels before and after surgery helps healing and can limit problems. . A way of improving blood sugar control is eating a healthy diet by: o  Eating less sugar and carbohydrates o  Increasing activity/exercise o  Talking with your doctor about reaching your blood sugar goals . High blood sugars (greater than 180 mg/dL) can raise your risk of infections and slow your recovery, so you will need to focus on controlling your diabetes during the weeks before surgery. . Make sure that the doctor who takes care of your diabetes knows about your planned surgery including the date and  location.  How do I manage my blood sugar before surgery? . Check your blood sugar at least 4 times a day, starting 2 days before surgery, to make sure that the level is not too high or low. . Check your blood sugar the morning of your surgery when you wake up and every 2 hours until you get to the Short Stay unit. o If your blood sugar is less than 70 mg/dL, you will need to treat for low blood sugar: - Do not take insulin. - Treat a low blood sugar (less than 70 mg/dL) with  cup of clear juice (cranberry or apple), 4 glucose tablets, OR glucose gel. - Recheck blood sugar in 15 minutes after treatment (to make sure it is greater than 70 mg/dL). If your blood sugar is not greater than 70 mg/dL on recheck, call [phone] for further instructions. . Report your blood sugar to the short stay nurse when you get to Short Stay.  . If you are admitted to the hospital after surgery: o Your blood sugar will be checked by the staff and you will probably be given insulin after surgery (instead of oral diabetes medicines) to make sure you have good blood sugar levels. o The goal for blood sugar control after surgery is 80-180 mg/dL.   The Morning of Surgery:            Do not wear jewelry, make up, or nail polish.  Do not wear lotions, powders, perfumes, or deodorant.            Do not shave 48 hours prior to surgery.             Do not bring valuables to the hospital.            [facility] is not responsible for any belongings or valuables.  Do NOT Smoke (Tobacco/Vaping) or drink Alcohol 24 hours prior to your procedure. If you use a CPAP at night, you may bring all equipment for your overnight stay.   Contacts, glasses, dentures or bridgework may not be worn into surgery.      For patients admitted to the hospital, discharge time will be determined by your treatment team.   Patients discharged the day of surgery will not be allowed to drive home, and someone needs to stay with them  for 24 hours.  Special instructions:   [place]- Preparing For Surgery  Before surgery, you can play an important role. Because skin is not sterile, your skin needs to be as free of germs as possible. You can reduce the number of germs on your skin by washing with CHG (chlorahexidine gluconate) Soap before surgery.  CHG is an antiseptic cleaner which kills germs and bonds with the skin to continue killing germs even after washing.    Oral Hygiene is also important to reduce your risk of infection.  Remember - BRUSH YOUR TEETH THE MORNING OF SURGERY WITH YOUR REGULAR TOOTHPASTE  Please do not use if you have an allergy to CHG or antibacterial soaps. If your skin becomes reddened/irritated stop using the CHG.  Do not shave (including legs and underarms) for at least 48 hours prior to first CHG shower. It is OK to shave your face.  Please follow these instructions carefully.   1. Shower the NIGHT BEFORE SURGERY and the MORNING OF SURGERY with CHG Soap.   2. If you chose to wash your hair, wash your hair first as usual with your normal shampoo.  3. After you shampoo, rinse your hair and body thoroughly to remove the shampoo.  4. Use CHG as you would any other liquid soap. You can apply CHG directly to the skin and wash gently with a scrungie or a clean washcloth.   5. Apply the CHG Soap to your body ONLY FROM THE NECK DOWN.  Do not use on open wounds or open sores. Avoid contact with your eyes, ears, mouth and genitals (private parts). Wash Face and genitals (private parts)  with your normal soap.   6. Wash thoroughly, paying special attention to the area where your surgery will be performed.  7. Thoroughly rinse your body with warm water from the neck down.  8. DO NOT shower/wash with your normal soap after using and rinsing off the CHG Soap.  9. Pat yourself dry with a CLEAN TOWEL.  10. Wear CLEAN PAJAMAS to bed the night before surgery  11. Place CLEAN SHEETS on your bed the night  of your first shower and DO NOT SLEEP WITH PETS.   Day of Surgery: Wear Clean/Comfortable clothing the morning of surgery Do not apply any deodorants/lotions.   Remember to brush your teeth WITH YOUR REGULAR TOOTHPASTE.   Please read over the following fact sheets that you were given.

## 2019-08-20 ENCOUNTER — Inpatient Hospital Stay (HOSPITAL_COMMUNITY): Payer: Medicare Other | Admitting: Vascular Surgery

## 2019-08-20 ENCOUNTER — Inpatient Hospital Stay (HOSPITAL_COMMUNITY): Payer: Medicare Other

## 2019-08-20 ENCOUNTER — Inpatient Hospital Stay (HOSPITAL_COMMUNITY)
Admission: RE | Admit: 2019-08-20 | Discharge: 2019-08-21 | DRG: 036 | Disposition: A | Discharge status: Home / Self Care | Payer: Medicare Other | Attending: Vascular Surgery | Admitting: Vascular Surgery

## 2019-08-20 ENCOUNTER — Inpatient Hospital Stay (HOSPITAL_COMMUNITY): Payer: Medicare Other | Admitting: Anesthesiology

## 2019-08-20 ENCOUNTER — Encounter (HOSPITAL_COMMUNITY)
Admission: RE | Disposition: A | Discharge status: Home / Self Care | Payer: Self-pay | Source: Home / Self Care | Attending: Vascular Surgery

## 2019-08-20 ENCOUNTER — Encounter (HOSPITAL_COMMUNITY): Payer: Self-pay | Admitting: Vascular Surgery

## 2019-08-20 DIAGNOSIS — Z87891 Personal history of nicotine dependence: Secondary | ICD-10-CM | POA: Diagnosis not present

## 2019-08-20 DIAGNOSIS — E785 Hyperlipidemia, unspecified: Secondary | ICD-10-CM | POA: Diagnosis present

## 2019-08-20 DIAGNOSIS — I69311 Memory deficit following cerebral infarction: Secondary | ICD-10-CM | POA: Diagnosis not present

## 2019-08-20 DIAGNOSIS — I69331 Monoplegia of upper limb following cerebral infarction affecting right dominant side: Secondary | ICD-10-CM | POA: Diagnosis not present

## 2019-08-20 DIAGNOSIS — Z6833 Body mass index (BMI) 33.0-33.9, adult: Secondary | ICD-10-CM | POA: Diagnosis not present

## 2019-08-20 DIAGNOSIS — Z7982 Long term (current) use of aspirin: Secondary | ICD-10-CM

## 2019-08-20 DIAGNOSIS — I10 Essential (primary) hypertension: Secondary | ICD-10-CM | POA: Diagnosis present

## 2019-08-20 DIAGNOSIS — E119 Type 2 diabetes mellitus without complications: Secondary | ICD-10-CM | POA: Diagnosis present

## 2019-08-20 DIAGNOSIS — I63232 Cerebral infarction due to unspecified occlusion or stenosis of left carotid arteries: Secondary | ICD-10-CM | POA: Diagnosis present

## 2019-08-20 DIAGNOSIS — Z79899 Other long term (current) drug therapy: Secondary | ICD-10-CM

## 2019-08-20 DIAGNOSIS — I6522 Occlusion and stenosis of left carotid artery: Secondary | ICD-10-CM | POA: Diagnosis not present

## 2019-08-20 DIAGNOSIS — E669 Obesity, unspecified: Secondary | ICD-10-CM | POA: Diagnosis present

## 2019-08-20 DIAGNOSIS — I444 Left anterior fascicular block: Secondary | ICD-10-CM | POA: Diagnosis present

## 2019-08-20 DIAGNOSIS — Z8673 Personal history of transient ischemic attack (TIA), and cerebral infarction without residual deficits: Secondary | ICD-10-CM | POA: Diagnosis not present

## 2019-08-20 DIAGNOSIS — I69328 Other speech and language deficits following cerebral infarction: Secondary | ICD-10-CM

## 2019-08-20 DIAGNOSIS — I6381 Other cerebral infarction due to occlusion or stenosis of small artery: Secondary | ICD-10-CM | POA: Diagnosis not present

## 2019-08-20 DIAGNOSIS — Z20822 Contact with and (suspected) exposure to covid-19: Secondary | ICD-10-CM | POA: Diagnosis not present

## 2019-08-20 DIAGNOSIS — E7800 Pure hypercholesterolemia, unspecified: Secondary | ICD-10-CM | POA: Diagnosis not present

## 2019-08-20 DIAGNOSIS — I63512 Cerebral infarction due to unspecified occlusion or stenosis of left middle cerebral artery: Secondary | ICD-10-CM | POA: Diagnosis not present

## 2019-08-20 DIAGNOSIS — Z7902 Long term (current) use of antithrombotics/antiplatelets: Secondary | ICD-10-CM | POA: Diagnosis not present

## 2019-08-20 DIAGNOSIS — E1149 Type 2 diabetes mellitus with other diabetic neurological complication: Secondary | ICD-10-CM | POA: Diagnosis not present

## 2019-08-20 HISTORY — PX: TRANSCAROTID ARTERY REVASCULARIZATIONÂ: SHX6778

## 2019-08-20 LAB — ABO/RH: ABO/RH(D): O POS

## 2019-08-20 LAB — CBC
HCT: 42.9 % (ref 36.0–46.0)
Hemoglobin: 14.5 g/dL (ref 12.0–15.0)
MCH: 30.1 pg (ref 26.0–34.0)
MCHC: 33.8 g/dL (ref 30.0–36.0)
MCV: 89.2 fL (ref 80.0–100.0)
Platelets: 165 10*3/uL (ref 150–400)
RBC: 4.81 MIL/uL (ref 3.87–5.11)
RDW: 13.8 % (ref 11.5–15.5)
WBC: 9.1 10*3/uL (ref 4.0–10.5)
nRBC: 0 % (ref 0.0–0.2)

## 2019-08-20 LAB — GLUCOSE, CAPILLARY
Glucose-Capillary: 164 mg/dL — ABNORMAL HIGH (ref 70–99)
Glucose-Capillary: 156 mg/dL — ABNORMAL HIGH (ref 70–99)

## 2019-08-20 LAB — POCT ACTIVATED CLOTTING TIME: Activated Clotting Time: 307 seconds

## 2019-08-20 LAB — CREATININE, SERUM
Creatinine, Ser: 0.53 mg/dL (ref 0.44–1.00)
GFR calc Af Amer: >60 mL/min (ref >60)
GFR calc non Af Amer: >60 mL/min (ref >60)

## 2019-08-20 SURGERY — TRANSCAROTID ARTERY REVASCULARIZATION (TCAR)
Anesthesia: General | Site: Neck | Laterality: Left

## 2019-08-20 MED ORDER — PROTAMINE SULFATE 10 MG/ML IV SOLN
INTRAVENOUS | Status: DC | PRN
  Administered 2019-08-20: 50 mg via INTRAVENOUS

## 2019-08-20 MED ORDER — GLYCOPYRROLATE 0.2 MG/ML IJ SOLN
INTRAMUSCULAR | Status: DC | PRN
Start: 2019-08-20 — End: 2019-08-20
  Administered 2019-08-20 (×2): .1 mg via INTRAVENOUS
  Administered 2019-08-20: .2 mg via INTRAVENOUS

## 2019-08-20 MED ORDER — ONDANSETRON HCL 4 MG/2ML IJ SOLN
INTRAMUSCULAR | Status: DC | PRN
  Administered 2019-08-20: 4 mg via INTRAVENOUS

## 2019-08-20 MED ORDER — MAGNESIUM SULFATE 2 GM/50ML IV SOLN: 2.0000 g | Freq: Every day | INTRAVENOUS | Status: DC | PRN

## 2019-08-20 MED ORDER — CEFAZOLIN SODIUM-DEXTROSE 2-4 GM/100ML-% IV SOLN
2.0000 g | INTRAVENOUS | Status: AC
  Administered 2019-08-20: 2 g via INTRAVENOUS
  Filled 2019-08-20: qty 100

## 2019-08-20 MED ORDER — EPHEDRINE 5 MG/ML INJ
INTRAVENOUS | Status: AC
  Filled 2019-08-20: qty 10

## 2019-08-20 MED ORDER — ROCURONIUM BROMIDE 10 MG/ML (PF) SYRINGE
PREFILLED_SYRINGE | INTRAVENOUS | Status: AC
  Filled 2019-08-20: qty 10

## 2019-08-20 MED ORDER — ROCURONIUM BROMIDE 10 MG/ML (PF) SYRINGE
PREFILLED_SYRINGE | INTRAVENOUS | Status: DC | PRN
  Administered 2019-08-20: 40 mg via INTRAVENOUS
  Administered 2019-08-20 (×2): 10 mg via INTRAVENOUS

## 2019-08-20 MED ORDER — LABETALOL HCL 5 MG/ML IV SOLN: 10.0000 mg | INTRAVENOUS | Status: DC | PRN

## 2019-08-20 MED ORDER — PHENYLEPHRINE HCL-NACL 10-0.9 MG/250ML-% IV SOLN
INTRAVENOUS | Status: DC | PRN
Start: 2019-08-20 — End: 2019-08-20
  Administered 2019-08-20: 35 ug/min via INTRAVENOUS

## 2019-08-20 MED ORDER — MIDAZOLAM HCL 2 MG/2ML IJ SOLN
INTRAMUSCULAR | Status: AC
  Filled 2019-08-20: qty 2

## 2019-08-20 MED ORDER — HEPARIN SODIUM (PORCINE) 1000 UNIT/ML IJ SOLN
INTRAMUSCULAR | Status: DC | PRN
  Administered 2019-08-20: 10000 [IU] via INTRAVENOUS

## 2019-08-20 MED ORDER — PHENOL 1.4 % MT LIQD: 1.0000 | OROMUCOSAL | Status: DC | PRN

## 2019-08-20 MED ORDER — FENTANYL CITRATE (PF) 250 MCG/5ML IJ SOLN
INTRAMUSCULAR | Status: AC
  Filled 2019-08-20: qty 5

## 2019-08-20 MED ORDER — METFORMIN HCL ER 500 MG PO TB24
500.0000 mg | ORAL_TABLET | Freq: Every day | ORAL | Status: DC
  Administered 2019-08-21: 500 mg via ORAL
  Filled 2019-08-20: qty 1

## 2019-08-20 MED ORDER — SODIUM CHLORIDE 0.9 % IV SOLN: 500.0000 mL | Freq: Once | INTRAVENOUS | Status: DC | PRN

## 2019-08-20 MED ORDER — SUGAMMADEX SODIUM 200 MG/2ML IV SOLN
INTRAVENOUS | Status: DC | PRN
Start: 2019-08-20 — End: 2019-08-20
  Administered 2019-08-20: 100 mg via INTRAVENOUS

## 2019-08-20 MED ORDER — 0.9 % SODIUM CHLORIDE (POUR BTL) OPTIME
TOPICAL | Status: DC | PRN
  Administered 2019-08-20: 1000 mL

## 2019-08-20 MED ORDER — EPHEDRINE SULFATE 50 MG/ML IJ SOLN
INTRAMUSCULAR | Status: DC | PRN
  Administered 2019-08-20: 10 mg via INTRAVENOUS
  Administered 2019-08-20: 5 mg via INTRAVENOUS

## 2019-08-20 MED ORDER — SODIUM CHLORIDE 0.9 % IV SOLN: INTRAVENOUS | Status: DC

## 2019-08-20 MED ORDER — ZOLPIDEM TARTRATE 5 MG PO TABS: 5.0000 mg | ORAL_TABLET | Freq: Every evening | ORAL | Status: DC | PRN

## 2019-08-20 MED ORDER — ACETAMINOPHEN 650 MG RE SUPP: 325.0000 mg | RECTAL | Status: DC | PRN

## 2019-08-20 MED ORDER — PROPOFOL 10 MG/ML IV BOLUS
INTRAVENOUS | Status: DC | PRN
  Administered 2019-08-20: 120 mg via INTRAVENOUS
  Administered 2019-08-20: 20 mg via INTRAVENOUS

## 2019-08-20 MED ORDER — CHLORHEXIDINE GLUCONATE 0.12 % MT SOLN
15.0000 mL | Freq: Once | OROMUCOSAL | Status: AC
  Administered 2019-08-20: 15 mL via OROMUCOSAL
  Filled 2019-08-20: qty 15

## 2019-08-20 MED ORDER — HEPARIN SODIUM (PORCINE) 5000 UNIT/ML IJ SOLN
5000.0000 [IU] | Freq: Three times a day (TID) | INTRAMUSCULAR | Status: DC
  Administered 2019-08-21: 5000 [IU] via SUBCUTANEOUS
  Filled 2019-08-20: qty 1

## 2019-08-20 MED ORDER — ORAL CARE MOUTH RINSE: 15.0000 mL | Freq: Once | OROMUCOSAL | Status: AC

## 2019-08-20 MED ORDER — ATORVASTATIN CALCIUM 80 MG PO TABS
80.0000 mg | ORAL_TABLET | Freq: Every day | ORAL | Status: DC
  Administered 2019-08-21: 80 mg via ORAL
  Filled 2019-08-20: qty 1

## 2019-08-20 MED ORDER — LOSARTAN POTASSIUM 50 MG PO TABS
50.0000 mg | ORAL_TABLET | Freq: Every day | ORAL | Status: DC
  Administered 2019-08-20 – 2019-08-21 (×2): 50 mg via ORAL
  Filled 2019-08-20 (×2): qty 1

## 2019-08-20 MED ORDER — HYDRALAZINE HCL 20 MG/ML IJ SOLN: 5.0000 mg | INTRAMUSCULAR | Status: DC | PRN

## 2019-08-20 MED ORDER — SENNOSIDES-DOCUSATE SODIUM 8.6-50 MG PO TABS: 1.0000 | ORAL_TABLET | Freq: Every evening | ORAL | Status: DC | PRN

## 2019-08-20 MED ORDER — PHENYLEPHRINE HCL (PRESSORS) 10 MG/ML IV SOLN
INTRAVENOUS | Status: DC | PRN
  Administered 2019-08-20: 80 ug via INTRAVENOUS

## 2019-08-20 MED ORDER — METOPROLOL TARTRATE 25 MG PO TABS
25.0000 mg | ORAL_TABLET | Freq: Two times a day (BID) | ORAL | Status: DC
  Administered 2019-08-20 – 2019-08-21 (×2): 25 mg via ORAL
  Filled 2019-08-20 (×2): qty 1

## 2019-08-20 MED ORDER — ONDANSETRON HCL 4 MG/2ML IJ SOLN
INTRAMUSCULAR | Status: AC
  Filled 2019-08-20: qty 2

## 2019-08-20 MED ORDER — LIDOCAINE HCL (PF) 1 % IJ SOLN
INTRAMUSCULAR | Status: AC
  Filled 2019-08-20: qty 5

## 2019-08-20 MED ORDER — LACTATED RINGERS IV SOLN: INTRAVENOUS | Status: DC

## 2019-08-20 MED ORDER — ACETAMINOPHEN 500 MG PO TABS: 1000.0000 mg | ORAL_TABLET | Freq: Once | ORAL | Status: DC

## 2019-08-20 MED ORDER — DEXAMETHASONE SODIUM PHOSPHATE 10 MG/ML IJ SOLN
INTRAMUSCULAR | Status: AC
  Filled 2019-08-20: qty 1

## 2019-08-20 MED ORDER — LIDOCAINE 2% (20 MG/ML) 5 ML SYRINGE
INTRAMUSCULAR | Status: DC | PRN
  Administered 2019-08-20: 60 mg via INTRAVENOUS

## 2019-08-20 MED ORDER — ONDANSETRON HCL 4 MG/2ML IJ SOLN: 4.0000 mg | Freq: Four times a day (QID) | INTRAMUSCULAR | Status: DC | PRN

## 2019-08-20 MED ORDER — GLYCOPYRROLATE PF 0.2 MG/ML IJ SOSY
PREFILLED_SYRINGE | INTRAMUSCULAR | Status: AC
  Filled 2019-08-20: qty 1

## 2019-08-20 MED ORDER — IODIXANOL 320 MG/ML IV SOLN
INTRAVENOUS | Status: DC | PRN
  Administered 2019-08-20: 50 mL via INTRAVENOUS
  Administered 2019-08-20: 53 mL via INTRAVENOUS

## 2019-08-20 MED ORDER — PHENYLEPHRINE 40 MCG/ML (10ML) SYRINGE FOR IV PUSH (FOR BLOOD PRESSURE SUPPORT)
PREFILLED_SYRINGE | INTRAVENOUS | Status: AC
  Filled 2019-08-20: qty 10

## 2019-08-20 MED ORDER — LIDOCAINE 2% (20 MG/ML) 5 ML SYRINGE
INTRAMUSCULAR | Status: AC
  Filled 2019-08-20: qty 5

## 2019-08-20 MED ORDER — ALUM & MAG HYDROXIDE-SIMETH 200-200-20 MG/5ML PO SUSP: 15.0000 mL | ORAL | Status: DC | PRN

## 2019-08-20 MED ORDER — HEPARIN SODIUM (PORCINE) 1000 UNIT/ML IJ SOLN
INTRAMUSCULAR | Status: AC
  Filled 2019-08-20: qty 1

## 2019-08-20 MED ORDER — PROPOFOL 10 MG/ML IV BOLUS
INTRAVENOUS | Status: AC
  Filled 2019-08-20: qty 20

## 2019-08-20 MED ORDER — SODIUM CHLORIDE 0.9 % IV SOLN
INTRAVENOUS | Status: DC | PRN
  Administered 2019-08-20: 500 mL

## 2019-08-20 MED ORDER — FENTANYL CITRATE (PF) 100 MCG/2ML IJ SOLN: 25.0000 ug | INTRAMUSCULAR | Status: DC | PRN

## 2019-08-20 MED ORDER — DEXAMETHASONE SODIUM PHOSPHATE 10 MG/ML IJ SOLN
INTRAMUSCULAR | Status: DC | PRN
  Administered 2019-08-20: 5 mg via INTRAVENOUS

## 2019-08-20 MED ORDER — CLOPIDOGREL BISULFATE 75 MG PO TABS
75.0000 mg | ORAL_TABLET | Freq: Every day | ORAL | Status: DC
  Administered 2019-08-21: 75 mg via ORAL
  Filled 2019-08-20: qty 1

## 2019-08-20 MED ORDER — FENTANYL CITRATE (PF) 100 MCG/2ML IJ SOLN
INTRAMUSCULAR | Status: DC | PRN
  Administered 2019-08-20: 50 ug via INTRAVENOUS
  Administered 2019-08-20: 100 ug via INTRAVENOUS

## 2019-08-20 MED ORDER — CEFAZOLIN SODIUM-DEXTROSE 2-4 GM/100ML-% IV SOLN
2.0000 g | Freq: Three times a day (TID) | INTRAVENOUS | Status: AC
  Administered 2019-08-20 – 2019-08-21 (×2): 2 g via INTRAVENOUS
  Filled 2019-08-20 (×2): qty 100

## 2019-08-20 MED ORDER — CHLORHEXIDINE GLUCONATE CLOTH 2 % EX PADS: 6.0000 | MEDICATED_PAD | Freq: Once | CUTANEOUS | Status: DC

## 2019-08-20 MED ORDER — ACETAMINOPHEN 325 MG PO TABS: 325.0000 mg | ORAL_TABLET | ORAL | Status: DC | PRN

## 2019-08-20 MED ORDER — SODIUM CHLORIDE 0.9 % IV SOLN
INTRAVENOUS | Status: AC
  Filled 2019-08-20: qty 1.2

## 2019-08-20 MED ORDER — PROTAMINE SULFATE 10 MG/ML IV SOLN
INTRAVENOUS | Status: AC
  Filled 2019-08-20: qty 5

## 2019-08-20 MED ORDER — ASPIRIN EC 325 MG PO TBEC
325.0000 mg | DELAYED_RELEASE_TABLET | Freq: Every day | ORAL | Status: DC
  Administered 2019-08-20 – 2019-08-21 (×2): 325 mg via ORAL
  Filled 2019-08-20 (×2): qty 1

## 2019-08-20 MED ORDER — OXYCODONE HCL 5 MG PO TABS: 5.0000 mg | ORAL_TABLET | ORAL | Status: DC | PRN

## 2019-08-20 MED ORDER — DOCUSATE SODIUM 100 MG PO CAPS
100.0000 mg | ORAL_CAPSULE | Freq: Every day | ORAL | Status: DC
  Administered 2019-08-21: 100 mg via ORAL
  Filled 2019-08-20: qty 1

## 2019-08-20 MED ORDER — AMLODIPINE BESYLATE 5 MG PO TABS
5.0000 mg | ORAL_TABLET | Freq: Every day | ORAL | Status: DC
  Administered 2019-08-20 – 2019-08-21 (×2): 5 mg via ORAL
  Filled 2019-08-20 (×2): qty 1

## 2019-08-20 MED ORDER — HEMOSTATIC AGENTS (NO CHARGE) OPTIME
TOPICAL | Status: DC | PRN
  Administered 2019-08-20: 1 via TOPICAL

## 2019-08-20 MED ORDER — AMISULPRIDE (ANTIEMETIC) 5 MG/2ML IV SOLN: 10.0000 mg | Freq: Once | INTRAVENOUS | Status: DC | PRN

## 2019-08-20 MED ORDER — POTASSIUM CHLORIDE CRYS ER 20 MEQ PO TBCR: 20.0000 meq | EXTENDED_RELEASE_TABLET | Freq: Every day | ORAL | Status: DC | PRN

## 2019-08-20 MED ORDER — MORPHINE SULFATE (PF) 2 MG/ML IV SOLN: 2.0000 mg | INTRAVENOUS | Status: DC | PRN

## 2019-08-20 MED ORDER — METOPROLOL TARTRATE 5 MG/5ML IV SOLN: 2.0000 mg | INTRAVENOUS | Status: DC | PRN

## 2019-08-20 MED ORDER — PANTOPRAZOLE SODIUM 40 MG PO TBEC
40.0000 mg | DELAYED_RELEASE_TABLET | Freq: Every day | ORAL | Status: DC
  Administered 2019-08-20 – 2019-08-21 (×2): 40 mg via ORAL
  Filled 2019-08-20 (×2): qty 1

## 2019-08-20 MED ORDER — GUAIFENESIN-DM 100-10 MG/5ML PO SYRP: 15.0000 mL | ORAL_SOLUTION | ORAL | Status: DC | PRN

## 2019-08-20 MED ORDER — ASPIRIN EC 325 MG PO TBEC: 325.0000 mg | DELAYED_RELEASE_TABLET | Freq: Every day | ORAL | Status: DC

## 2019-08-20 SURGICAL SUPPLY — 74 items
BAG BANDED W/RUBBER/TAPE 36X54 (MISCELLANEOUS) ×2 IMPLANT
BALLN STERLING RX 5X30X80 (BALLOONS) ×4
BALLOON STERLING RX 5X30X80 (BALLOONS) ×2 IMPLANT
CANISTER SUCT 3000ML PPV (MISCELLANEOUS) ×2 IMPLANT
CATH BEACON 5 .035 40 KMP TP (CATHETERS) ×1 IMPLANT
CATH BEACON 5 .038 40 KMP TP (CATHETERS) ×1
CATH ROBINSON RED A/P 18FR (CATHETERS) ×2 IMPLANT
CLIP VESOCCLUDE MED 6/CT (CLIP) ×2 IMPLANT
CLIP VESOCCLUDE SM WIDE 6/CT (CLIP) ×2 IMPLANT
COVER DOME SNAP 22 D (MISCELLANEOUS) ×2 IMPLANT
COVER PROBE W GEL 5X96 (DRAPES) ×2 IMPLANT
COVER WAND RF STERILE (DRAPES) ×2 IMPLANT
DERMABOND ADVANCED (GAUZE/BANDAGES/DRESSINGS) ×2
DERMABOND ADVANCED .7 DNX12 (GAUZE/BANDAGES/DRESSINGS) ×2 IMPLANT
DRAIN CHANNEL 15F RND FF W/TCR (WOUND CARE) IMPLANT
DRAPE FEMORAL ANGIO 80X135IN (DRAPES) IMPLANT
DRAPE INCISE IOBAN 66X45 STRL (DRAPES) IMPLANT
ELECT REM PT RETURN 9FT ADLT (ELECTROSURGICAL) ×2
ELECTRODE REM PT RTRN 9FT ADLT (ELECTROSURGICAL) ×1 IMPLANT
EVACUATOR SILICONE 100CC (DRAIN) IMPLANT
GLOVE BIO SURGEON STRL SZ7.5 (GLOVE) ×2 IMPLANT
GOWN STRL REUS W/ TWL LRG LVL3 (GOWN DISPOSABLE) ×2 IMPLANT
GOWN STRL REUS W/ TWL XL LVL3 (GOWN DISPOSABLE) ×1 IMPLANT
GOWN STRL REUS W/TWL LRG LVL3 (GOWN DISPOSABLE) ×2
GOWN STRL REUS W/TWL XL LVL3 (GOWN DISPOSABLE) ×1
GUIDEWIRE ENROUTE 0.014 (WIRE) ×2 IMPLANT
HEMOSTAT SNOW SURGICEL 2X4 (HEMOSTASIS) ×2 IMPLANT
INSERT FOGARTY SM (MISCELLANEOUS) IMPLANT
INTRODUCER KIT GALT 7CM (INTRODUCER) ×1
IV ADAPTER SYR DOUBLE MALE LL (MISCELLANEOUS) IMPLANT
KIT BASIN OR (CUSTOM PROCEDURE TRAY) ×2 IMPLANT
KIT ENCORE 26 ADVANTAGE (KITS) ×2 IMPLANT
KIT INTRODUCER GALT 7 (INTRODUCER) ×1 IMPLANT
KIT TURNOVER KIT B (KITS) ×2 IMPLANT
NEEDLE HYPO 25GX1X1/2 BEV (NEEDLE) IMPLANT
NEEDLE PERC 18GX7CM (NEEDLE) ×2 IMPLANT
NEEDLE SPNL 20GX3.5 QUINCKE YW (NEEDLE) IMPLANT
PACK CAROTID (CUSTOM PROCEDURE TRAY) ×2 IMPLANT
PACK UNIVERSAL I (CUSTOM PROCEDURE TRAY) IMPLANT
PAD ARMBOARD 7.5X6 YLW CONV (MISCELLANEOUS) ×4 IMPLANT
POSITIONER HEAD DONUT 9IN (MISCELLANEOUS) ×2 IMPLANT
PROTECTION STATION PRESSURIZED (MISCELLANEOUS)
SET MICROPUNCTURE 5F STIFF (MISCELLANEOUS) ×2 IMPLANT
SHEATH AVANTI 11CM 5FR (SHEATH) IMPLANT
SHUNT CAROTID BYPASS 10 (VASCULAR PRODUCTS) IMPLANT
SHUNT CAROTID BYPASS 12FRX15.5 (VASCULAR PRODUCTS) IMPLANT
STATION PROTECTION PRESSURIZED (MISCELLANEOUS) IMPLANT
STENT TRANSCAROTID SYS 10X30 (Permanent Stent) ×2 IMPLANT
STENT TRANSCAROTID SYSTEM 9X30 (Permanent Stent) ×2 IMPLANT
STOPCOCK 4 WAY LG BORE MALE ST (IV SETS) ×2 IMPLANT
SUT ETHILON 3 0 PS 1 (SUTURE) IMPLANT
SUT MNCRL AB 4-0 PS2 18 (SUTURE) ×2 IMPLANT
SUT PROLENE 5 0 C 1 24 (SUTURE) ×2 IMPLANT
SUT PROLENE 6 0 BV (SUTURE) ×2 IMPLANT
SUT PROLENE 7 0 BV 1 (SUTURE) IMPLANT
SUT SILK 2 0 PERMA HAND 18 BK (SUTURE) IMPLANT
SUT SILK 2 0 SH CR/8 (SUTURE) ×2 IMPLANT
SUT SILK 3 0 (SUTURE)
SUT SILK 3-0 18XBRD TIE 12 (SUTURE) IMPLANT
SUT VIC AB 3-0 SH 27 (SUTURE) ×1
SUT VIC AB 3-0 SH 27X BRD (SUTURE) ×1 IMPLANT
SYR 10ML LL (SYRINGE) ×6 IMPLANT
SYR 20ML LL LF (SYRINGE) IMPLANT
SYR 5ML LL (SYRINGE) IMPLANT
SYR CONTROL 10ML LL (SYRINGE) IMPLANT
SYSTEM TRANSCAROTID NEUROPRTCT (MISCELLANEOUS) ×1 IMPLANT
TOWEL GREEN STERILE (TOWEL DISPOSABLE) ×2 IMPLANT
TRANSCAROTID NEUROPROTECT SYS (MISCELLANEOUS) ×2
TUBING ART PRESS 48 MALE/FEM (TUBING) IMPLANT
TUBING EXTENTION W/L.L. (IV SETS) IMPLANT
WATER STERILE IRR 1000ML POUR (IV SOLUTION) ×2 IMPLANT
WIRE AMPLATZ SS-J .035X180CM (WIRE) IMPLANT
WIRE BENTSON .035X145CM (WIRE) ×2 IMPLANT
WIRE SPARTACORE .014X190CM (WIRE) ×6 IMPLANT

## 2019-08-20 NOTE — H&P (Signed)
History of Present Illness: This is a 69 y.o. female who presented to the emergency department due to progressive confusion and dysphagia over the course of about 4 to 5 days.  She underwent stroke work-up including MRI demonstrating acute to subacute left hemispheric CVA.  CTA neck demonstrates near occlusive proximal ICA lesion.  This is confirmed with 80 to 99% stenosis of left ICA noted on carotid duplex this morning.  Patient states she is back to near baseline as far as mentation.  She denies any right-sided numbness or weakness.  She also denies any vision changes.  She is a tobacco smoker however states she is motivated to no longer smoke after diagnosis of CVA.  She denies any claudication, nonhealing wounds, or rest pain of bilateral lower extremities.  She has been started on aspirin and Plavix this admission.  She has also been started on a statin.  History reviewed. No pertinent past medical history.  History reviewed. No pertinent surgical history.  No Known Allergies         Prior to Admission medications   Medication Sig Start Date End Date Taking? Authorizing Provider  MULTIPLE VITAMIN PO Take 1 tablet by mouth daily.   Yes [provider]    Social History        Socioeconomic History  . Marital status: Married    Spouse name: Not on file  . Number of children: Not on file  . Years of education: Not on file  . Highest education level: Not on file  Occupational History  . Not on file  Tobacco Use  . Smoking status: Not on file  Substance and Sexual Activity  . Alcohol use: Not on file  . Drug use: Not on file  . Sexual activity: Not on file  Other Topics Concern  . Not on file  Social History Narrative  . Not on file   Social Determinants of Health      Financial Resource Strain:   . Difficulty of Paying Living Expenses:   Food Insecurity:   . Worried About Charity fundraiser in the Last Year:   . Arboriculturist in the Last  Year:   Transportation Needs:   . Film/video editor (Medical):   Marland Kitchen Lack of Transportation (Non-Medical):   Physical Activity:   . Days of Exercise per Week:   . Minutes of Exercise per Session:   Stress:   . Feeling of Stress :   Social Connections:   . Frequency of Communication with Friends and Family:   . Frequency of Social Gatherings with Friends and Family:   . Attends Religious Services:   . Active Member of Clubs or Organizations:   . Attends Archivist Meetings:   Marland Kitchen Marital Status:   Intimate Partner Violence:   . Fear of Current or Ex-Partner:   . Emotionally Abused:   Marland Kitchen Physically Abused:   . Sexually Abused:      History reviewed. No pertinent family history.  ROS: Otherwise negative unless mentioned in HPI  Physical Examination  Vitals:   08/20/19 1111  BP: (!) 204/78  Pulse: 94  Resp: 18  Temp: 98.5 F (36.9 C)  SpO2: 97%    General:  WDWN in NAD Gait: Not observed HENT: WNL, normocephalic Pulmonary: normal non-labored breathing, without Rales, rhonchi,  wheezing Cardiac: regular Abdomen:  soft, NT/ND, no masses Skin: without rashes Vascular Exam/Pulses: Symmetrical radial pulses; palpable right DP pulse; no palpable left pedal pulses  however foot is warm to touch with good capillary refill Extremities: without ischemic changes, without Gangrene , without cellulitis; without open wounds;  Musculoskeletal: no muscle wasting or atrophy       Neurologic: A&O X 3;  No focal weakness or paresthesias are detected; speech is fluent/normal Psychiatric:  The pt has Normal affect. Lymph:  Unremarkable  CBC         Component Value Date/Time   WBC 8.5 08/13/2019 0320   RBC 5.73 (H) 08/13/2019 0320   HGB 16.7 (H) 08/13/2019 0320   HCT 51.3 (H) 08/13/2019 0320   PLT 214 08/13/2019 0320   MCV 89.5 08/13/2019 0320   MCH 29.1 08/13/2019 0320   MCHC 32.6 08/13/2019 0320   RDW 14.6 08/13/2019 0320   LYMPHSABS 2.0 08/12/2019  1058   MONOABS 0.8 08/12/2019 1058   EOSABS 0.1 08/12/2019 1058   BASOSABS 0.0 08/12/2019 1058    BMET         Component Value Date/Time   NA 140 08/13/2019 0320   K 3.8 08/13/2019 0320   CL 103 08/13/2019 0320   CO2 24 08/13/2019 0320   GLUCOSE 157 (H) 08/13/2019 0320   BUN 10 08/13/2019 0320   CREATININE 0.59 08/13/2019 0320   CALCIUM 9.7 08/13/2019 0320   GFRNONAA >60 08/13/2019 0320   GFRAA >60 08/13/2019 0320    COAGS:      Lab Results  Component Value Date   INR 1.0 08/12/2019     Non-Invasive Vascular Imaging:   CTA demonstrating near occlusion of proximal left ICA Right ICA without hemodynamically significant stenosis  Carotid duplex demonstrating 80 to 99% stenosis of left ICA   ASSESSMENT/PLAN: This is a 69 y.o. female with symptomatic left ICA stenosis, CVA  Plan is for left transcarotid artery stenting today.  She has been compliant with her aspirin, Plavix, statin therapy.  She does have some residual right hand weakness and speech and memory difficulty.  Daquon Greenleaf C. Donzetta Matters, MD [facility] [place] Office: [phone] Pager: [phone]

## 2019-08-20 NOTE — Progress Notes (Signed)
  Progress Note    08/20/2019 4:26 PM Day of Surgery  Subjective: seen in recovery room. Patient is drowsy but states that she feels comfortable   Vitals:   08/20/19 1602 08/20/19 1617  BP: 137/71 (!) 163/74  Pulse: 76 84  Resp: 20 20  Temp:  98.8 F (37.1 C)  SpO2: 92% 95%   Physical Exam: Cardiac:  regular Lungs: non labored Incisions:  Left neck incision c/d/i without hematoma or swelling Extremities:  Right common femoral vein access site c/d/i without swelling or hematoma Abdomen:  Obese, soft, non tender Neurologic: alert and oriented x3. CN intact. Moving all extremities equally and without deficits  CBC    Component Value Date/Time   WBC 12.5 (H) 08/19/2019 1522   RBC 5.83 (H) 08/19/2019 1522   HGB 17.2 (H) 08/19/2019 1522   HCT 52.3 (H) 08/19/2019 1522   PLT 223 08/19/2019 1522   MCV 89.7 08/19/2019 1522   MCH 29.5 08/19/2019 1522   MCHC 32.9 08/19/2019 1522   RDW 14.0 08/19/2019 1522   LYMPHSABS 2.0 08/12/2019 1058   MONOABS 0.8 08/12/2019 1058   EOSABS 0.1 08/12/2019 1058   BASOSABS 0.0 08/12/2019 1058    BMET    Component Value Date/Time   NA 137 08/19/2019 1522   K 3.4 (L) 08/19/2019 1522   CL 100 08/19/2019 1522   CO2 22 08/19/2019 1522   GLUCOSE 168 (H) 08/19/2019 1522   BUN 12 08/19/2019 1522   CREATININE 0.71 08/19/2019 1522   CALCIUM 9.7 08/19/2019 1522   GFRNONAA >60 08/19/2019 1522   GFRAA >60 08/19/2019 1522    INR    Component Value Date/Time   INR 1.0 08/19/2019 1522     Intake/Output Summary (Last 24 hours) at 08/20/2019 1626 Last data filed at 08/20/2019 1535 Gross per 24 hour  Intake 1390 ml  Output 30 ml  Net 1360 ml     Assessment/Plan:  69 y.o. female is s/p left transcarotid artery stenting Day of Surgery. Doing well post operatively. Minimal surgical site pain. Neurologically intact. Left neck incision clean, dry and intact without hematoma. Right common femoral vein access site clean dry and intact without  hematoma. To 4E later today   Karoline Caldwell, PA-C [facility] [phone] 08/20/2019 4:26 PM

## 2019-08-20 NOTE — Transfer of Care (Signed)
Immediate Anesthesia Transfer of Care Note  Patient: Lisa [name]  Procedure(s) Performed: LEFT TRANSCAROTID ARTERY REVASCULARIZATION (Left Neck)  Patient Location: PACU  Anesthesia Type:General  Level of Consciousness: drowsy and patient cooperative  Airway & Oxygen Therapy: Patient Spontanous Breathing and Patient connected to nasal cannula oxygen  Post-op Assessment: Report given to RN and Post -op Vital signs reviewed and stable  Post vital signs: Reviewed and stable  Last Vitals:  Vitals Value Taken Time  BP 139/64 08/20/19 1517  Temp    Pulse 83 08/20/19 1518  Resp 21 08/20/19 1518  SpO2 94 % 08/20/19 1518  Vitals shown include unvalidated device data.  Last Pain:  Vitals:   08/20/19 1145  TempSrc:   PainSc: 0-No pain      Patients Stated Pain Goal: 5 (44/61/90 1222)  Complications: No complications documented.

## 2019-08-20 NOTE — Op Note (Addendum)
Patient name: Lisa [name] MRN: [id number] DOB: November 19, 1950 Sex: female  08/20/2019 Pre-operative Diagnosis: Symptomatic left ICA stenosis Post-operative diagnosis:  Same Surgeon:  Erlene Quan C. Donzetta Matters, MD  Assistant: Paulo Fruit, PA Procedure Performed: Left transcarotid artery stenting with distal 9 x 40 mm and proximal 10 x 30 mm stent with flow reversal distal embolic protection  Indications: 69 year old female with a history of a left MCA embolic stroke.  She has evidence of high-grade stenosis of the left ICA at the bifurcation and is indicated for the above procedure.  An assistant was needed for this case to assist with retraction, exposure, suctioning, wire handling.  Findings: There was a subtotal occlusion of the left ICA.  After multiple angled views were able to identify a very small lumen to cannulate this.  After the first stent we had to extend into the common carotid artery.  There was good flow lumen and 2 views at completion.  Upon awakening from anesthesia she was noted to be neurologically intact.   Procedure:  The patient was identified in the holding area and taken to the operating where she was placed upon the operative table general anesthesia was induced.  She was gently prepped and draped the left neck chest bilateral groins in usual fashion antibiotics were ministered a timeout was called.  We began using ultrasound to identify the right common femoral vein.  This was cannulated micropuncture needle followed by wire sheath.  A Bentson wire was placed followed by the 8 [place] sheath by silk Road.  This was affixed to the skin with silk suture flushed with heparinized saline.  Longitudinal incision was made over the common carotid artery between the 2 heads of the sternocleidomastoid.  We dissected down through these bluntly.  The omohyoid muscle was partially divided.  We identified the common carotid artery.  The vagus nerve was protected.  The IJ was reflected  laterally.  We placed an umbilical tape and vessel loop around the common carotid artery.  Patient was fully heparinized ACT returned greater than 300.  A figure-of-eight stitch was placed in the common carotid artery with Vicryl suture.  This was cannulated with micropuncture needle followed by wire to 3 cm of the sheath 3 cm.  Imaging was performed.  We then placed the J-wire Amplatz to the level of the bifurcation and the sheath was placed.  We hope to reversal flow and passive flow was established.  We performed angiography and 2 views.  The common carotid artery was clamped we then attempted to cannulate the ICA with a balloon and wire.  We could not cannulate.  Ultimately we used a Berenstein catheter and long 014 wire.  We were able to cannulate and a extreme RAO view.  We then balloon dilated the ICA with 5 mm balloon.  A 9 x 40 mm stent was then placed.  Unfortunately the poststenotic dilatation did lead up some of our length we then extended down into the common carotid artery with a 10 x 30.  We did not postdilated.  We took to view imaging of completion which demonstrated good flow in the ICA did fill distally with some spasm.  The wire was removed clamp was removed.  We return the blood to the right groin.  50 mg of protamine was administered after we removed the sheath and cinched our 5-0 Prolene suture.  We obtain hemostasis in the wound.  The right groin was removed and pressure held.  We irrigated the neck  wound closed in layers of Vicryl.  Dermabond placed to the level of skin in the groin incision.  He was awake from anesthesia having tolerated procedure without any complication.  Contrast: 40 cc.  Blood loss: 50 cc  Clamp time 33 minutes    Karmine Kauer C. Donzetta Matters, MD [facility] [place] Office: [phone] Pager: [phone]

## 2019-08-20 NOTE — Progress Notes (Signed)
Pt received from PACU to 4e03. Alert and oriented to room and call bell. CHG bath completed. Pt connected to tele box and CCMD called. VSS. Call bell in reach. Will continue to monitor.  Arletta Bale, RN

## 2019-08-20 NOTE — Anesthesia Procedure Notes (Signed)
Arterial Line Insertion Start/End7/09/2019 12:40 PM, 08/20/2019 12:50 PM Performed by: Moshe Salisbury, CRNA, CRNA  Patient location: Pre-op. Preanesthetic checklist: patient identified, IV checked, site marked, risks and benefits discussed, surgical consent, monitors and equipment checked, pre-op evaluation, timeout performed and anesthesia consent Lidocaine 1% used for infiltration Right, radial was placed Catheter size: 20 G Hand hygiene performed , maximum sterile barriers used  and Seldinger technique used  Attempts: 1 Procedure performed without using ultrasound guided technique. Following insertion, dressing applied and Biopatch. Post procedure assessment: normal  Patient tolerated the procedure well with no immediate complications.

## 2019-08-20 NOTE — Anesthesia Procedure Notes (Signed)
Procedure Name: Intubation Date/Time: 08/20/2019 1:14 PM Performed by: Moshe Salisbury, CRNA Pre-anesthesia Checklist: Patient identified, Emergency Drugs available, Suction available and Patient being monitored Patient Re-evaluated:Patient Re-evaluated prior to induction Oxygen Delivery Method: Circle System Utilized Preoxygenation: Pre-oxygenation with 100% oxygen Induction Type: IV induction Ventilation: Mask ventilation without difficulty Laryngoscope Size: Mac and 3 Grade View: Grade II Tube type: Oral Tube size: 7.5 mm Number of attempts: 1 Airway Equipment and Method: Stylet Placement Confirmation: ETT inserted through vocal cords under direct vision,  positive ETCO2 and breath sounds checked- equal and bilateral Secured at: 21 cm Tube secured with: Tape Dental Injury: Teeth and Oropharynx as per pre-operative assessment

## 2019-08-20 NOTE — Anesthesia Postprocedure Evaluation (Signed)
Anesthesia Post Note  Patient: Lisa [name]  Procedure(s) Performed: LEFT TRANSCAROTID ARTERY REVASCULARIZATION (Left Neck)     Patient location during evaluation: PACU Anesthesia Type: General Level of consciousness: awake and alert Pain management: pain level controlled Vital Signs Assessment: post-procedure vital signs reviewed and stable Respiratory status: spontaneous breathing, nonlabored ventilation, respiratory function stable and patient connected to nasal cannula oxygen Cardiovascular status: blood pressure returned to baseline and stable Postop Assessment: no apparent nausea or vomiting Anesthetic complications: no   No complications documented.  Last Vitals:  Vitals:   08/20/19 1617 08/20/19 1717  BP: (!) 163/74 (!) 144/69  Pulse: 84 71  Resp: 20 (!) 23  Temp: 37.1 C   SpO2: 95% 94%    Last Pain:  Vitals:   08/20/19 1617  TempSrc:   PainSc: 0-No pain    LLE Motor Response: Purposeful movement (08/20/19 1717)   RLE Motor Response: Purposeful movement (08/20/19 1717)        Tiajuana Amass

## 2019-08-21 ENCOUNTER — Encounter (HOSPITAL_COMMUNITY): Payer: Self-pay | Admitting: Vascular Surgery

## 2019-08-21 MED ORDER — HYDROCODONE-ACETAMINOPHEN 5-325 MG PO TABS: 1.0000 | ORAL_TABLET | ORAL | 0 refills | Status: DC | PRN

## 2019-08-21 NOTE — Discharge Summary (Signed)
Carotid Discharge Summary     Lisa [name] May 13, 1950 69 y.o. 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  The pt tolerated the procedure well and was transported to the PACU in good condition.    By POD 1, the pt neuro status remained intact. Minimal surgical site discomfort. Left neck incision remained clean, dry and intact. Right common femoral vein access site intact without hematoma.  The remainder of the hospital course consisted of increasing mobilization and increasing intake of solids without difficulty.  She remained stable for discharge home. She was instructed on taking Aspirin, Plavix and statin. She will follow up with Dr. 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 Stenosis of carotid artery   . Abnormality of aortic valve   . DM (diabetes mellitus), type 2, uncontrolled w/neurologic complication ([place])     . Hypercholesteremia   . CVA (cerebral vascular accident) ([place]) 08/12/2019  . Acute ischemic stroke ([place])   . Hypertension    Past Medical History:  Diagnosis Date  . Carotid arterial disease ([place]) 07/2019  . Diabetes ([place])   . HTN (hypertension)   . Hyperlipidemia   . Stroke [facility])    acute/subacute left MCA infarct 08/12/19    Allergies as of 08/21/2019   No Known Allergies     Medication List    TAKE these medications   amLODipine 5 MG tablet Commonly known as: NORVASC Take 1 tablet (5 mg total) by mouth daily.   aspirin 325 MG EC tablet Take 1 tablet (325 mg total) by mouth daily.   atorvastatin 80 MG tablet Commonly known as: LIPITOR Take 1 tablet (80 mg total) by mouth daily.   clopidogrel 75 MG tablet Commonly known as: PLAVIX Take 1 tablet (75 mg total) by mouth daily.   HYDROcodone-acetaminophen 5-325 MG tablet Commonly known as: NORCO/VICODIN Take 1 tablet by mouth every 4 (four) hours as needed for moderate pain.   losartan 50 MG tablet Commonly known as: COZAAR Take 1 tablet (50 mg total) by mouth daily.   metFORMIN 500 MG 24 hr tablet Commonly known as: Glucophage XR Take 1 tablet (500 mg total) by mouth daily with breakfast.   metoprolol tartrate 25 MG tablet Commonly known as: LOPRESSOR Take 1 tablet (25 mg total) by mouth 2 (two) times daily.   MULTIPLE  VITAMIN PO Take 1 tablet by mouth daily.        Discharge Instructions:   [facility] Discharge Instructions Trans Carotid Artery Revascularization (TCAR)  Please refer to the following instructions for your post-procedure care. Your surgeon or physician assistant will discuss any changes with you.  Activity  You are encouraged to walk as much as you can. You can slowly return to normal activities but must avoid strenuous activity and heavy lifting until your doctor tell you it's OK. Avoid activities such as vacuuming or swinging a golf club. You can  drive after one week if you are comfortable and you are no longer taking prescription pain medications. It is normal to feel tired for serval weeks after your surgery. It is also normal to have difficulty with sleep habits, eating, and bowel movements after surgery. These will go away with time.  Bathing/Showering  You may shower after you come home. Do not soak in a bathtub, hot tub, or swim until the incision heals completely.  Incision Care  Shower every day. Clean your incision with mild soap and water. Pat the area dry with a clean towel. You do not need a bandage unless otherwise instructed. Do not apply any ointments or creams to your incision. You may have skin glue on your incision. Do not peel it off. It will come off on its own in about one week. Your incision may feel thickened and raised for several weeks after your surgery. This is normal and the skin will soften over time. For Men Only: It's OK to shave around the incision but do not shave the incision itself for 2 weeks. It is common to have numbness under your chin that could last for several months.  Diet  Resume your normal diet. There are no special food restrictions following this procedure. A low fat/low cholesterol diet is recommended for all patients with vascular disease. In order to heal from your surgery, it is CRITICAL to get adequate nutrition. Your body requires vitamins, minerals, and protein. Vegetables are the best source of vitamins and minerals. Vegetables also provide the perfect balance of protein. Processed food has little nutritional value, so try to avoid this.  Medications  Resume taking all of your medications unless your doctor or physician assistant tells you not to.  If your incision is causing pain, you may take over-the- counter pain relievers such as acetaminophen (Tylenol). If you were prescribed a stronger pain medication, please be aware these medications can cause nausea and constipation.  Prevent  nausea by taking the medication with a snack or meal. Avoid constipation by drinking plenty of fluids and eating foods with a high amount of fiber, such as fruits, vegetables, and grains. Do not take Tylenol if you are taking prescription pain medications.  Follow Up  Our office will schedule a follow up appointment 4 weeks following discharge.  Please call us immediately for any of the following conditions  . Increased pain, redness, drainage (pus) from your incision site. . Fever of 101 degrees or higher. . If you should develop stroke (slurred speech, difficulty swallowing, weakness on one side of your body, loss of vision) you should call 911 and go to the nearest emergency room. .  Reduce your risk of vascular disease:  . Stop smoking. If you would like help call [facility] at 1-800-QUIT-NOW 806-383-9272) or [place] at 2097504398. . Manage your cholesterol . Maintain a desired weight . Control your diabetes . Keep your  blood pressure down .  If you have any questions, please call the office at [phone].  Prescriptions given: Hydrocodone- Acetaminophen #6 No Refill  Disposition: Home  Patient's condition: is Excellent  Follow up: 1. Dr. Donzetta [name] in 4 weeks with Carotid Duplex   Nicolle Heward PA-C [facility] [phone]   --- For [facility] Registry use ---   Modified Rankin score at D/C (0-6): 0  IV medication needed for:  1. Hypertension: No 2. Hypotension: No  Post-op Complications: No  1. Post-op CVA or TIA: No  If yes: Event classification (right eye, left eye, right cortical, left cortical, verterobasilar, other): no  If yes: Timing of event (intra-op, <6 hrs post-op, >=6 hrs post-op, unknown): no  2. CN injury: No  If yes: CN no injuried   3. Myocardial infarction: No  If yes: Dx by (EKG or clinical, Troponin): no  4.  CHF: No  5.  Dysrhythmia (new): No  6. Wound infection: No  7. Reperfusion symptoms: No  8.  Return to OR: No  If yes: return to OR for (bleeding, neurologic, other CEA incision, other): no  Discharge medications: Statin use:  Yes ASA use:  Yes   Beta blocker use:  Yes ACE-Inhibitor use:  No  ARB use:  No CCB use: Yes P2Y12 Antagonist use: Yes, Valu.Nieves ] Plavix, [ ]  Plasugrel, [ ]  Ticlopinine, [ ]  Ticagrelor, [ ]  Other, [ ]  No for medical reason, [ ]  Non-compliant, [ ]  Not-indicated Anti-coagulant use:  No, [ ]  Warfarin, [ ]  Rivaroxaban, [ ]  Dabigatran,

## 2019-08-21 NOTE — Discharge Instructions (Signed)
[facility]  Discharge Instructions   Transcarotid Artery Stenting (TCAR)  Please refer to the following instructions for your post-procedure care. Your surgeon or physician assistant will discuss any changes with you.  Activity  You are encouraged to walk as much as you can. You can slowly return to normal activities but must avoid strenuous activity and heavy lifting until your doctor tell you it's okay. Avoid activities such as vacuuming or swinging a golf club. You can drive after one week if you are comfortable and you are no longer taking prescription pain medications. It is normal to feel tired for serval weeks after your surgery. It is also normal to have difficulty with sleep habits, eating, and bowel movements after surgery. These will go away with time.  Bathing/Showering  Shower daily after you go home. Do not soak in a bathtub, hot tub, or swim until the incision heals completely.  Incision Care  Shower every day. Clean your incision with mild soap and water. Pat the area dry with a clean towel. You do not need a bandage unless otherwise instructed. Do not apply any ointments or creams to your incision. You may have skin glue on your incision. Do not peel it off. It will come off on its own in about one week. Your incision may feel thickened and raised for several weeks after your surgery. This is normal and the skin will soften over time.   For Men Only: It's okay to shave around the incision but do not shave the incision itself for 2 weeks. It is common to have numbness under your chin that could last for several months.  Diet  Resume your normal diet. There are no special food restrictions following this procedure. A low fat/low cholesterol diet is recommended for all patients with vascular disease. In order to heal from your surgery, it is CRITICAL to get adequate nutrition. Your body requires vitamins, minerals, and protein.  Vegetables are the best source of vitamins and minerals. Vegetables also provide the perfect balance of protein. Processed food has little nutritional value, so try to avoid this.  Medications  Resume taking all of your medications unless your doctor or physician assistant tells you not to. If your incision is causing pain, you may take over-the- counter pain relievers such as acetaminophen (Tylenol). If you were prescribed a stronger pain medication, please be aware these medications can cause nausea and constipation. Prevent nausea by taking the medication with a snack or meal. Avoid constipation by drinking plenty of fluids and eating foods with a high amount of fiber, such as fruits, vegetables, and grains.  Do not take Tylenol if you are taking prescription pain medications.  Follow Up  Our office will schedule a follow up appointment 2-3 weeks following discharge.  Please call us immediately for any of the following conditions  . Increased pain, redness, drainage (pus) from your incision site. . Fever of 101 degrees or higher. . If you should develop stroke (slurred speech, difficulty swallowing, weakness on one side of your body, loss of vision) you should call 911 and go to the nearest emergency room. .  Reduce your risk of vascular disease:  . Stop smoking. If you would like help call [facility] at 1-800-QUIT-NOW 585-848-0982) or [place] at (534)308-5645. . Manage your cholesterol . Maintain a desired weight . Control your diabetes . Keep your blood pressure down .  If you have any questions, please call the  office at [phone].

## 2019-08-21 NOTE — Progress Notes (Addendum)
  Progress Note    08/21/2019 7:35 AM 1 Day Post-Op  Subjective: no complaints this morning. Minimal surgical site pain   Vitals:   08/21/19 0505 08/21/19 0506  BP: (!) 148/65   Pulse: 61   Resp: 20 20  Temp: 98 F (36.7 C)   SpO2: 95%    Physical Exam: Cardiac:  regular Lungs: non labored Incisions: left neck   Extremities: Moving all extremities without deficits Abdomen:  Soft, non tender, non distended Neurologic:alert and oriented. CN intact  CBC    Component Value Date/Time   WBC 9.1 08/20/2019 1838   RBC 4.81 08/20/2019 1838   HGB 14.5 08/20/2019 1838   HCT 42.9 08/20/2019 1838   PLT 165 08/20/2019 1838   MCV 89.2 08/20/2019 1838   MCH 30.1 08/20/2019 1838   MCHC 33.8 08/20/2019 1838   RDW 13.8 08/20/2019 1838   LYMPHSABS 2.0 08/12/2019 1058   MONOABS 0.8 08/12/2019 1058   EOSABS 0.1 08/12/2019 1058   BASOSABS 0.0 08/12/2019 1058    BMET    Component Value Date/Time   NA 137 08/19/2019 1522   K 3.4 (L) 08/19/2019 1522   CL 100 08/19/2019 1522   CO2 22 08/19/2019 1522   GLUCOSE 168 (H) 08/19/2019 1522   BUN 12 08/19/2019 1522   CREATININE 0.53 08/20/2019 1838   CALCIUM 9.7 08/19/2019 1522   GFRNONAA >60 08/20/2019 1838   GFRAA >60 08/20/2019 1838    INR    Component Value Date/Time   INR 1.0 08/19/2019 1522     Intake/Output Summary (Last 24 hours) at 08/21/2019 0735 Last data filed at 08/21/2019 0600 Gross per 24 hour  Intake 2016.44 ml  Output 30 ml  Net 1986.44 ml     Assessment/Plan:  69 y.o. female is s/p  Left transcarotid artery stenting 1 Day Post-Op. Neurologically intact. Left neck incision clean, dry and intact. Right common femoral vein access site intact without hematoma. She has ambulated in room. Tolerating diet. She will go home on Aspirin , Plavix and statin. She is stable to discharge home today. She will follow up with Dr. Donzetta Matters in 4 weeks  Karoline Caldwell, [place] [facility] [phone] 08/21/2019 7:35  AM   I have independently interviewed and examined patient and agree with PA assessment and plan above.   Kier Smead C. Donzetta Matters, MD [facility] [place] Office: [phone] Pager: [phone]

## 2019-08-24 ENCOUNTER — Telehealth: Payer: Self-pay

## 2019-08-24 ENCOUNTER — Inpatient Hospital Stay (HOSPITAL_COMMUNITY): Admission: RE | Admit: 2019-08-24 | Payer: Medicare Other | Source: Home / Self Care | Admitting: Internal Medicine

## 2019-08-24 ENCOUNTER — Encounter (HOSPITAL_COMMUNITY): Admission: RE | Payer: Self-pay | Source: Home / Self Care

## 2019-08-24 SURGERY — ECHOCARDIOGRAM, TRANSESOPHAGEAL
Anesthesia: Monitor Anesthesia Care

## 2019-08-24 NOTE — Telephone Encounter (Signed)
Triage received a message stating pt was recently discharged and TEE was cancelled. Pt return to hospital for procedure and wasn't aware it had been cancelled. Notification sent to reschedule procedure.  Attempted to contact pt. Left message to call back.

## 2019-08-26 NOTE — Telephone Encounter (Signed)
Attempted to call the patient back regarding her canceled TEE but her voicemail box was full so I was unable to leave a message at this time.

## 2019-08-28 DIAGNOSIS — I10 Essential (primary) hypertension: Secondary | ICD-10-CM | POA: Diagnosis not present

## 2019-08-28 DIAGNOSIS — Z48812 Encounter for surgical aftercare following surgery on the circulatory system: Secondary | ICD-10-CM | POA: Diagnosis not present

## 2019-08-28 DIAGNOSIS — Z7984 Long term (current) use of oral hypoglycemic drugs: Secondary | ICD-10-CM | POA: Diagnosis not present

## 2019-08-28 DIAGNOSIS — I63232 Cerebral infarction due to unspecified occlusion or stenosis of left carotid arteries: Secondary | ICD-10-CM | POA: Diagnosis not present

## 2019-08-28 DIAGNOSIS — I6522 Occlusion and stenosis of left carotid artery: Secondary | ICD-10-CM | POA: Diagnosis not present

## 2019-08-28 DIAGNOSIS — Z8673 Personal history of transient ischemic attack (TIA), and cerebral infarction without residual deficits: Secondary | ICD-10-CM | POA: Diagnosis not present

## 2019-08-28 DIAGNOSIS — E1149 Type 2 diabetes mellitus with other diabetic neurological complication: Secondary | ICD-10-CM | POA: Diagnosis not present

## 2019-08-28 DIAGNOSIS — Z7902 Long term (current) use of antithrombotics/antiplatelets: Secondary | ICD-10-CM | POA: Diagnosis not present

## 2019-08-28 DIAGNOSIS — E119 Type 2 diabetes mellitus without complications: Secondary | ICD-10-CM | POA: Diagnosis not present

## 2019-08-28 DIAGNOSIS — Z95818 Presence of other cardiac implants and grafts: Secondary | ICD-10-CM | POA: Diagnosis not present

## 2019-08-31 ENCOUNTER — Other Ambulatory Visit: Payer: Self-pay

## 2019-08-31 ENCOUNTER — Inpatient Hospital Stay (HOSPITAL_COMMUNITY)
Admission: EM | Admit: 2019-08-31 | Discharge: 2019-09-07 | DRG: 669 | Disposition: A | Discharge status: Home Health | Payer: Medicare Other | Attending: Family Medicine | Admitting: Family Medicine

## 2019-08-31 ENCOUNTER — Encounter (HOSPITAL_COMMUNITY): Payer: Self-pay | Admitting: Family Medicine

## 2019-08-31 ENCOUNTER — Emergency Department (HOSPITAL_COMMUNITY): Payer: Medicare Other

## 2019-08-31 DIAGNOSIS — E861 Hypovolemia: Secondary | ICD-10-CM | POA: Diagnosis not present

## 2019-08-31 DIAGNOSIS — Z8673 Personal history of transient ischemic attack (TIA), and cerebral infarction without residual deficits: Secondary | ICD-10-CM

## 2019-08-31 DIAGNOSIS — R079 Chest pain, unspecified: Secondary | ICD-10-CM

## 2019-08-31 DIAGNOSIS — E7800 Pure hypercholesterolemia, unspecified: Secondary | ICD-10-CM | POA: Diagnosis not present

## 2019-08-31 DIAGNOSIS — Z801 Family history of malignant neoplasm of trachea, bronchus and lung: Secondary | ICD-10-CM

## 2019-08-31 DIAGNOSIS — D62 Acute posthemorrhagic anemia: Secondary | ICD-10-CM | POA: Diagnosis not present

## 2019-08-31 DIAGNOSIS — L8990 Pressure ulcer of unspecified site, unspecified stage: Secondary | ICD-10-CM | POA: Insufficient documentation

## 2019-08-31 DIAGNOSIS — Z743 Need for continuous supervision: Secondary | ICD-10-CM | POA: Diagnosis not present

## 2019-08-31 DIAGNOSIS — R310 Gross hematuria: Secondary | ICD-10-CM | POA: Diagnosis present

## 2019-08-31 DIAGNOSIS — R319 Hematuria, unspecified: Secondary | ICD-10-CM | POA: Diagnosis not present

## 2019-08-31 DIAGNOSIS — E119 Type 2 diabetes mellitus without complications: Secondary | ICD-10-CM | POA: Diagnosis not present

## 2019-08-31 DIAGNOSIS — E785 Hyperlipidemia, unspecified: Secondary | ICD-10-CM | POA: Diagnosis not present

## 2019-08-31 DIAGNOSIS — R410 Disorientation, unspecified: Secondary | ICD-10-CM | POA: Diagnosis not present

## 2019-08-31 DIAGNOSIS — Z7902 Long term (current) use of antithrombotics/antiplatelets: Secondary | ICD-10-CM

## 2019-08-31 DIAGNOSIS — Z79899 Other long term (current) drug therapy: Secondary | ICD-10-CM | POA: Diagnosis not present

## 2019-08-31 DIAGNOSIS — Z20822 Contact with and (suspected) exposure to covid-19: Secondary | ICD-10-CM | POA: Diagnosis not present

## 2019-08-31 DIAGNOSIS — Z87891 Personal history of nicotine dependence: Secondary | ICD-10-CM

## 2019-08-31 DIAGNOSIS — N289 Disorder of kidney and ureter, unspecified: Secondary | ICD-10-CM | POA: Diagnosis not present

## 2019-08-31 DIAGNOSIS — Z7982 Long term (current) use of aspirin: Secondary | ICD-10-CM | POA: Diagnosis not present

## 2019-08-31 DIAGNOSIS — N2889 Other specified disorders of kidney and ureter: Secondary | ICD-10-CM | POA: Diagnosis not present

## 2019-08-31 DIAGNOSIS — N189 Chronic kidney disease, unspecified: Secondary | ICD-10-CM | POA: Diagnosis not present

## 2019-08-31 DIAGNOSIS — N179 Acute kidney failure, unspecified: Secondary | ICD-10-CM | POA: Diagnosis not present

## 2019-08-31 DIAGNOSIS — C679 Malignant neoplasm of bladder, unspecified: Secondary | ICD-10-CM | POA: Diagnosis not present

## 2019-08-31 DIAGNOSIS — D649 Anemia, unspecified: Secondary | ICD-10-CM | POA: Diagnosis not present

## 2019-08-31 DIAGNOSIS — I10 Essential (primary) hypertension: Secondary | ICD-10-CM | POA: Diagnosis not present

## 2019-08-31 DIAGNOSIS — Z7984 Long term (current) use of oral hypoglycemic drugs: Secondary | ICD-10-CM

## 2019-08-31 DIAGNOSIS — I69320 Aphasia following cerebral infarction: Secondary | ICD-10-CM | POA: Diagnosis not present

## 2019-08-31 DIAGNOSIS — N3289 Other specified disorders of bladder: Secondary | ICD-10-CM | POA: Diagnosis not present

## 2019-08-31 DIAGNOSIS — Z7901 Long term (current) use of anticoagulants: Secondary | ICD-10-CM | POA: Diagnosis not present

## 2019-08-31 DIAGNOSIS — R32 Unspecified urinary incontinence: Secondary | ICD-10-CM | POA: Diagnosis present

## 2019-08-31 LAB — PREPARE RBC (CROSSMATCH)

## 2019-08-31 LAB — URINALYSIS, ROUTINE W REFLEX MICROSCOPIC

## 2019-08-31 LAB — PROTIME-INR
INR: 1.1 (ref 0.8–1.2)
Prothrombin Time: 13.4 seconds (ref 11.4–15.2)

## 2019-08-31 LAB — CBC WITH DIFFERENTIAL/PLATELET
Abs Immature Granulocytes: 0.07 10*3/uL (ref 0.00–0.07)
Basophils Absolute: 0 10*3/uL (ref 0.0–0.1)
Basophils Relative: 0 %
Eosinophils Absolute: 0.2 10*3/uL (ref 0.0–0.5)
Eosinophils Relative: 1 %
HCT: 24.5 % — ABNORMAL LOW (ref 36.0–46.0)
Hemoglobin: 7.9 g/dL — ABNORMAL LOW (ref 12.0–15.0)
Immature Granulocytes: 1 %
Lymphocytes Relative: 12 %
Lymphs Abs: 1.8 10*3/uL (ref 0.7–4.0)
MCH: 28.3 pg (ref 26.0–34.0)
MCHC: 32.2 g/dL (ref 30.0–36.0)
MCV: 87.8 fL (ref 80.0–100.0)
Monocytes Absolute: 1.2 10*3/uL — ABNORMAL HIGH (ref 0.1–1.0)
Monocytes Relative: 8 %
Neutro Abs: 11.9 10*3/uL — ABNORMAL HIGH (ref 1.7–7.7)
Neutrophils Relative %: 78 %
Platelets: 425 10*3/uL — ABNORMAL HIGH (ref 150–400)
RBC: 2.79 MIL/uL — ABNORMAL LOW (ref 3.87–5.11)
RDW: 13.8 % (ref 11.5–15.5)
WBC: 15.1 10*3/uL — ABNORMAL HIGH (ref 4.0–10.5)
nRBC: 0 % (ref 0.0–0.2)

## 2019-08-31 LAB — BASIC METABOLIC PANEL
Anion gap: 16 — ABNORMAL HIGH (ref 5–15)
BUN: 28 mg/dL — ABNORMAL HIGH (ref 8–23)
CO2: 22 mmol/L (ref 22–32)
Calcium: 9.1 mg/dL (ref 8.9–10.3)
Chloride: 97 mmol/L — ABNORMAL LOW (ref 98–111)
Creatinine, Ser: 2.02 mg/dL — ABNORMAL HIGH (ref 0.44–1.00)
GFR calc Af Amer: 29 mL/min — ABNORMAL LOW (ref >60)
GFR calc non Af Amer: 25 mL/min — ABNORMAL LOW (ref >60)
Glucose, Bld: 157 mg/dL — ABNORMAL HIGH (ref 70–99)
Potassium: 3.3 mmol/L — ABNORMAL LOW (ref 3.5–5.1)
Sodium: 135 mmol/L (ref 135–145)

## 2019-08-31 LAB — URINALYSIS, MICROSCOPIC (REFLEX): RBC / HPF: >50 RBC/hpf (ref 0–5)

## 2019-08-31 LAB — TYPE AND SCREEN
ABO/RH(D): O POS
Antibody Screen: NEGATIVE

## 2019-08-31 LAB — CBC
HCT: 28.4 % — ABNORMAL LOW (ref 36.0–46.0)
Hemoglobin: 9.1 g/dL — ABNORMAL LOW (ref 12.0–15.0)
MCH: 28.9 pg (ref 26.0–34.0)
MCHC: 32 g/dL (ref 30.0–36.0)
MCV: 90.2 fL (ref 80.0–100.0)
Platelets: 465 10*3/uL — ABNORMAL HIGH (ref 150–400)
RBC: 3.15 MIL/uL — ABNORMAL LOW (ref 3.87–5.11)
RDW: 13.9 % (ref 11.5–15.5)
WBC: 17.4 10*3/uL — ABNORMAL HIGH (ref 4.0–10.5)
nRBC: 0 % (ref 0.0–0.2)

## 2019-08-31 LAB — CBG MONITORING, ED: Glucose-Capillary: 149 mg/dL — ABNORMAL HIGH (ref 70–99)

## 2019-08-31 LAB — GLUCOSE, CAPILLARY: Glucose-Capillary: 146 mg/dL — ABNORMAL HIGH (ref 70–99)

## 2019-08-31 LAB — TROPONIN I (HIGH SENSITIVITY)
Troponin I (High Sensitivity): 13 ng/L (ref <18)
Troponin I (High Sensitivity): 13 ng/L (ref <18)

## 2019-08-31 LAB — SARS CORONAVIRUS 2 BY RT PCR (HOSPITAL ORDER, PERFORMED IN ~~LOC~~ HOSPITAL LAB): SARS Coronavirus 2: NEGATIVE

## 2019-08-31 IMAGING — DX DG CHEST 1V PORT
1 series · 1 of 1 positions shown · non-contrast
Comparison: None.

CLINICAL DATA: Chest pain

EXAM:
PORTABLE CHEST 1 VIEW

[chest ap]
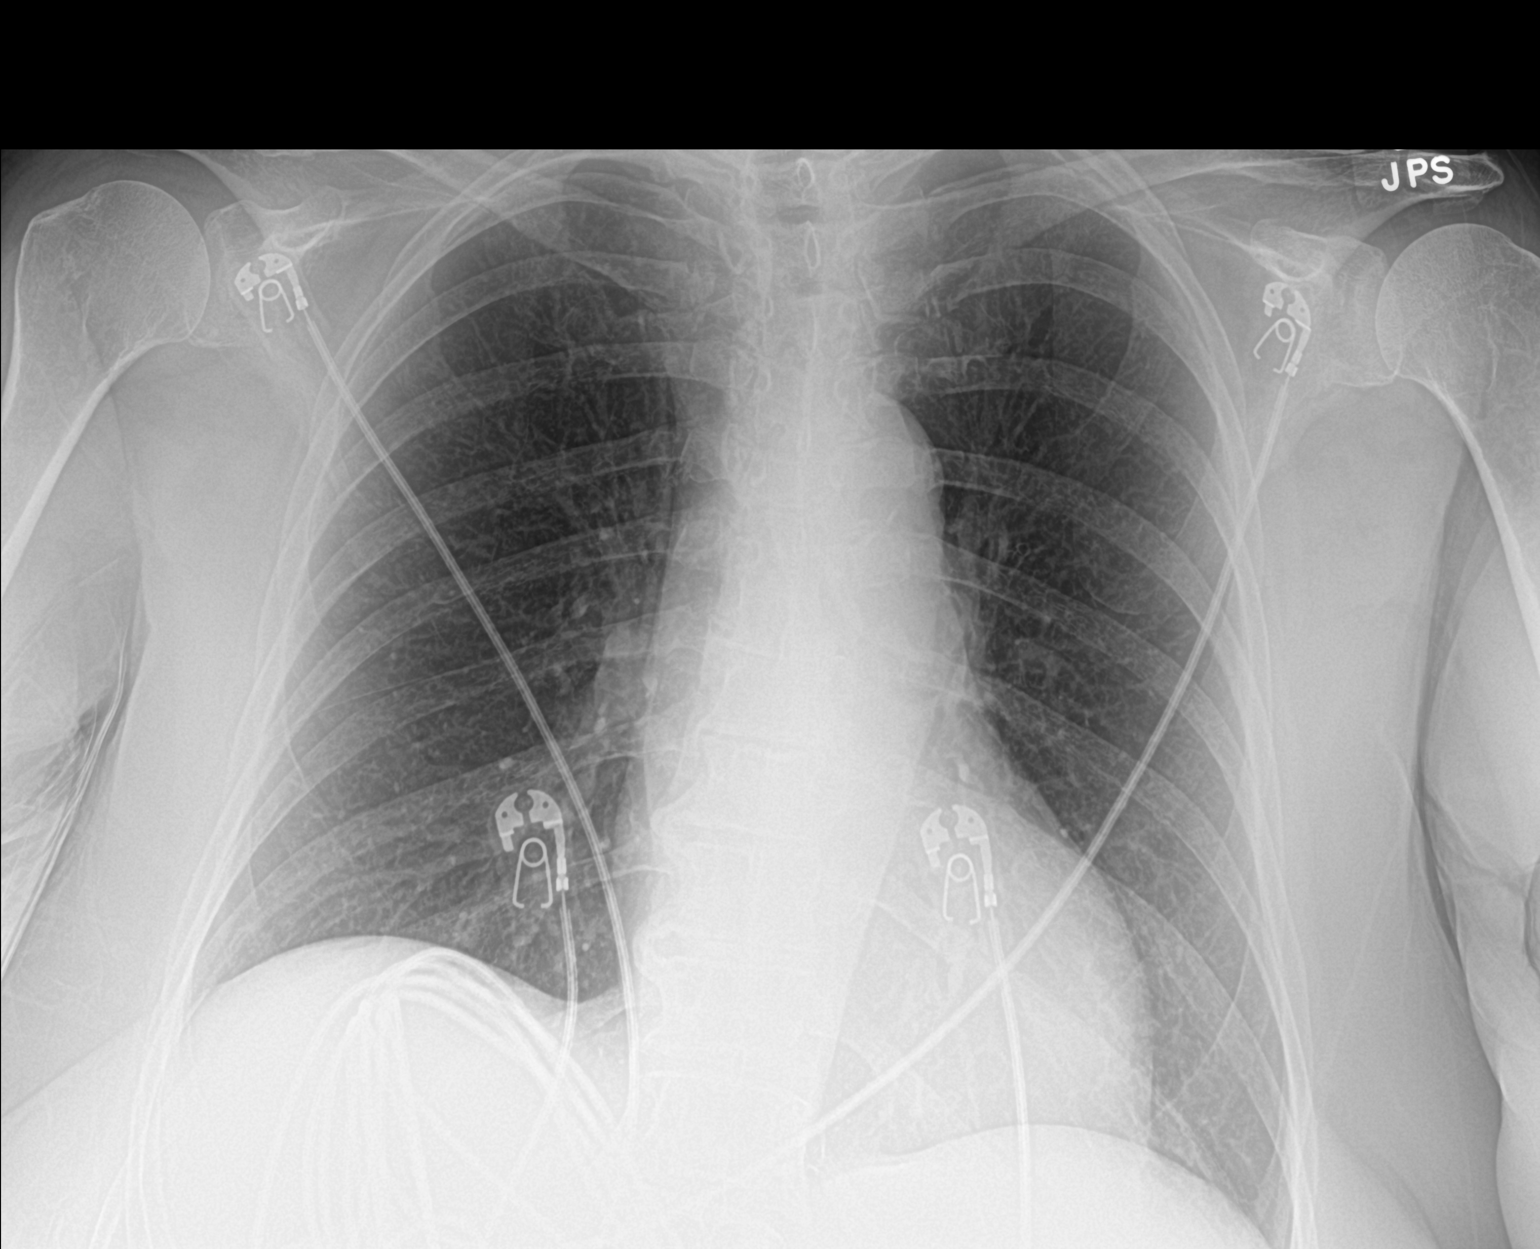

[1 of 1 positions shown; findings below may reference images not displayed]

FINDINGS: The heart size and mediastinal contours are within normal limits.
Both lungs are clear. No pleural effusion or pneumothorax. The
visualized skeletal structures are unremarkable.
IMPRESSION: No acute process in the chest.

## 2019-08-31 IMAGING — US US RENAL
1 series · 14 of 25 positions shown · non-contrast
Comparison: None.

CLINICAL DATA: Renal insufficiency

EXAM:
RENAL / URINARY TRACT ULTRASOUND COMPLETE

[Series 1: us abdomen complete · 14 of 38 slices shown]
[im 1/38]
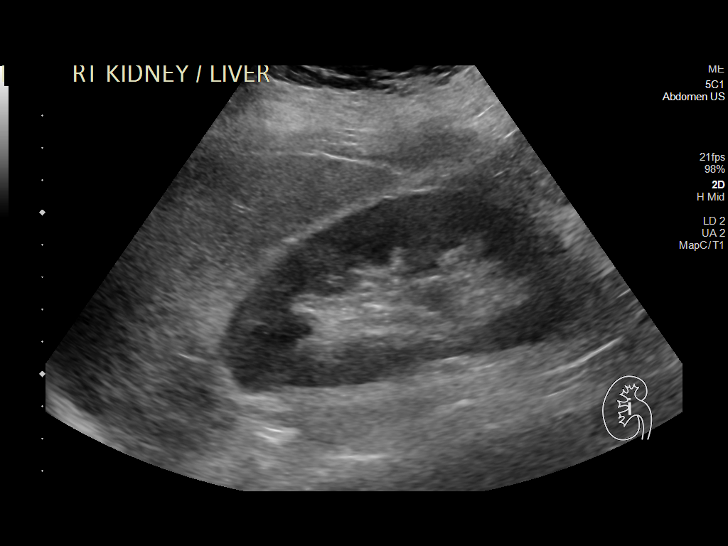
[im 4/38]
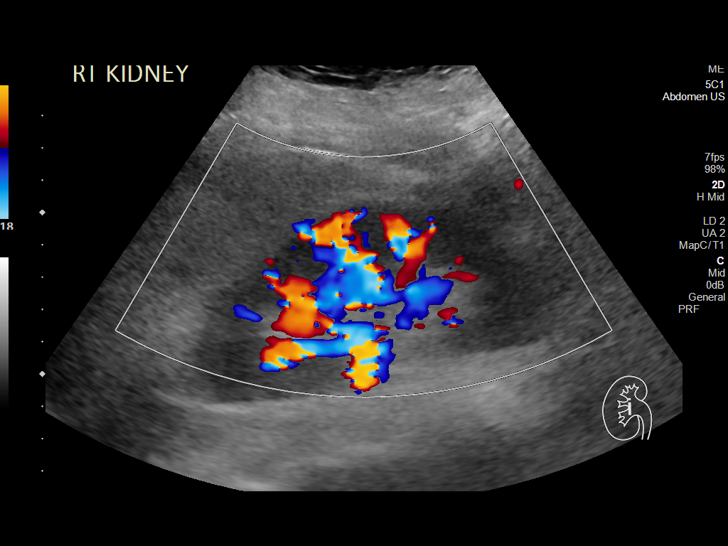
[im 7/38]
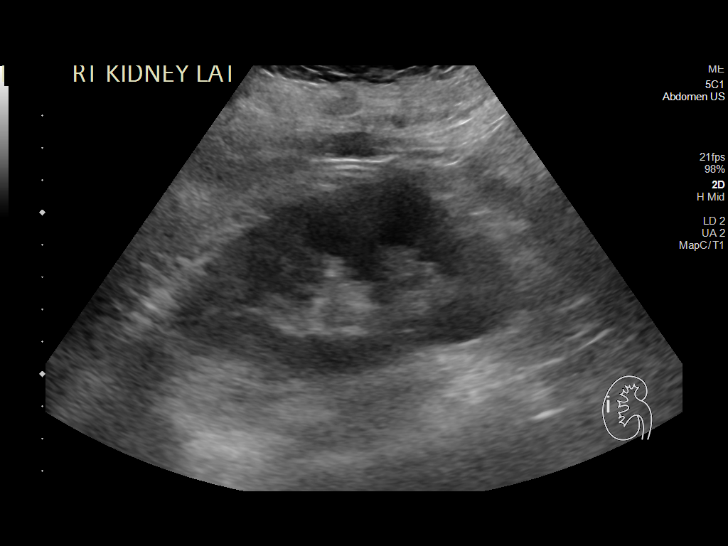
[im 10/38]
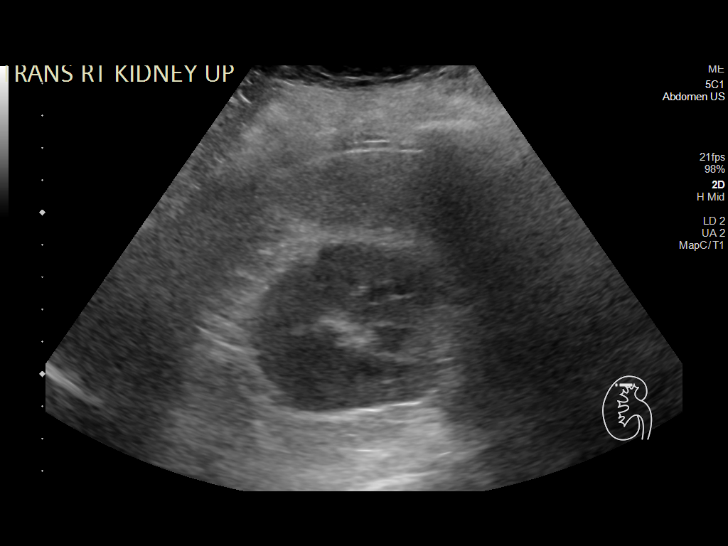
[im 13/38]
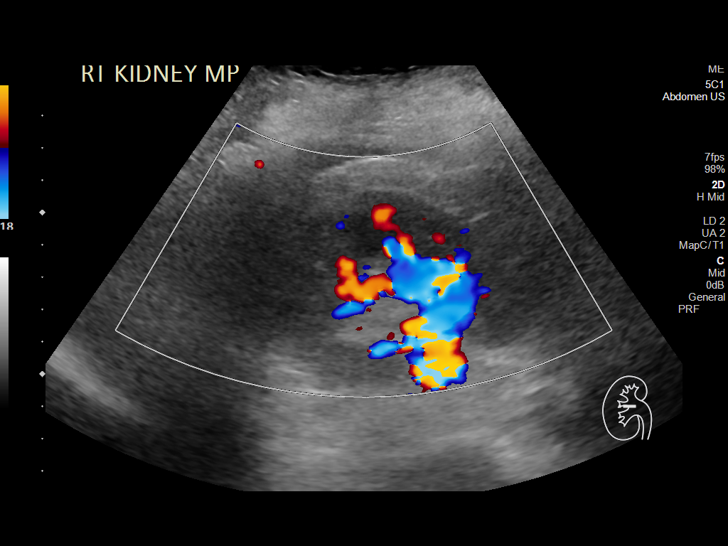
[im 14/38]
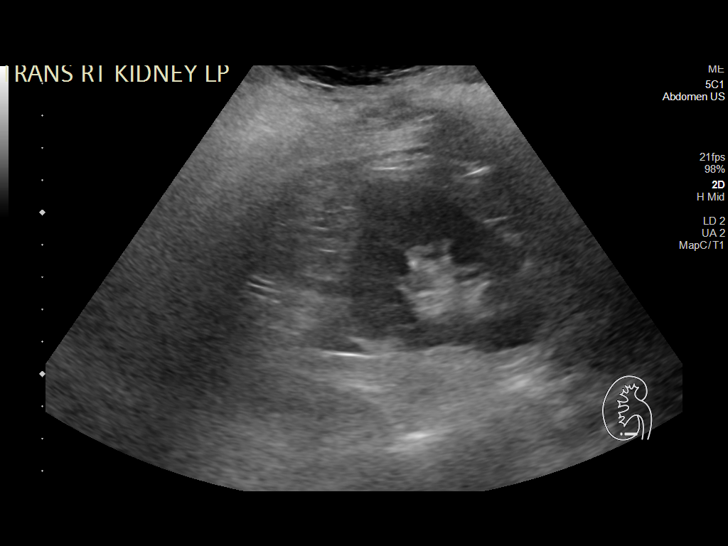
[im 17/38]
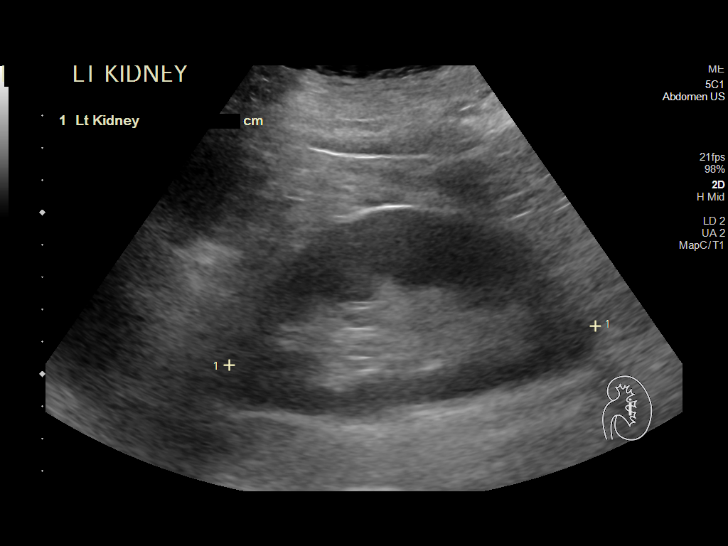
[im 21/38]
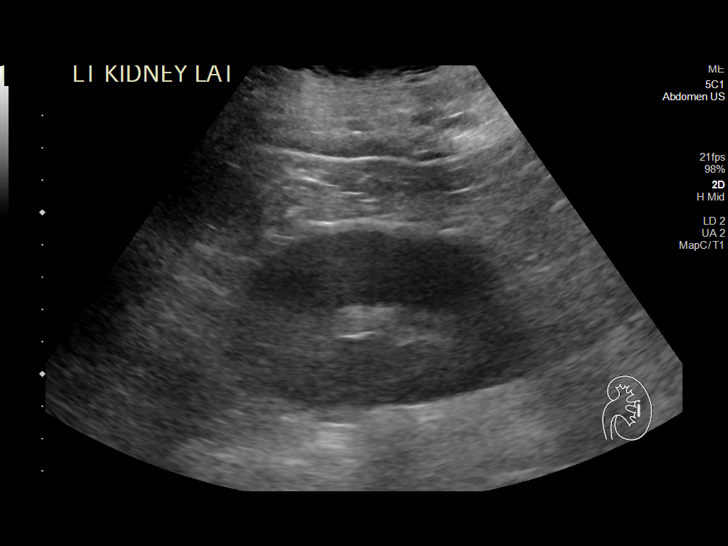
[im 24/38]
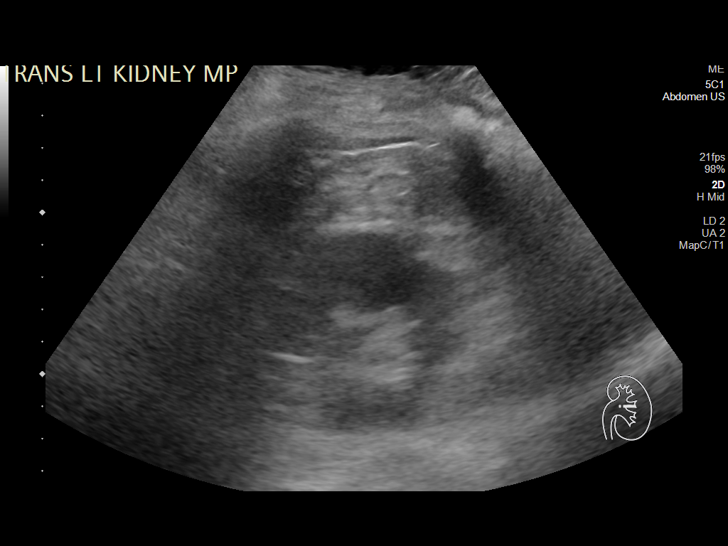
[im 25/38]
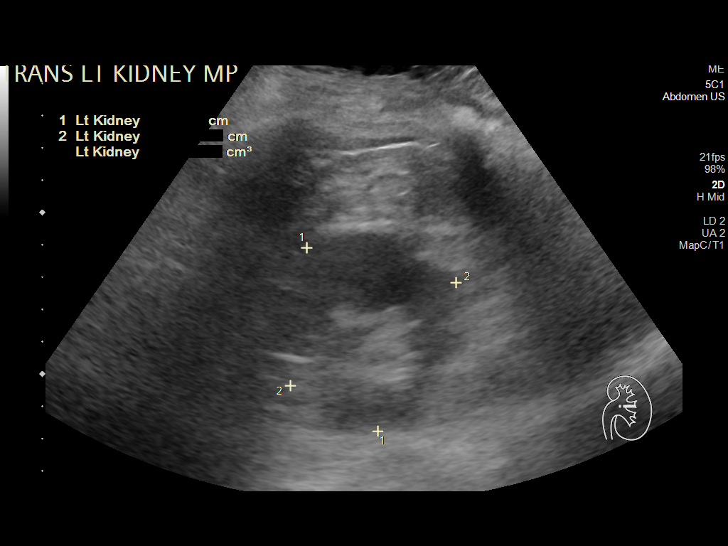
[im 28/38]
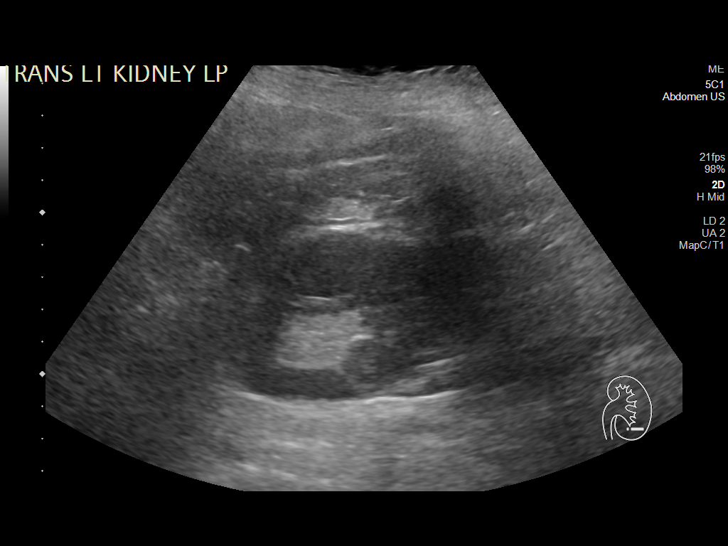
[im 31/38]
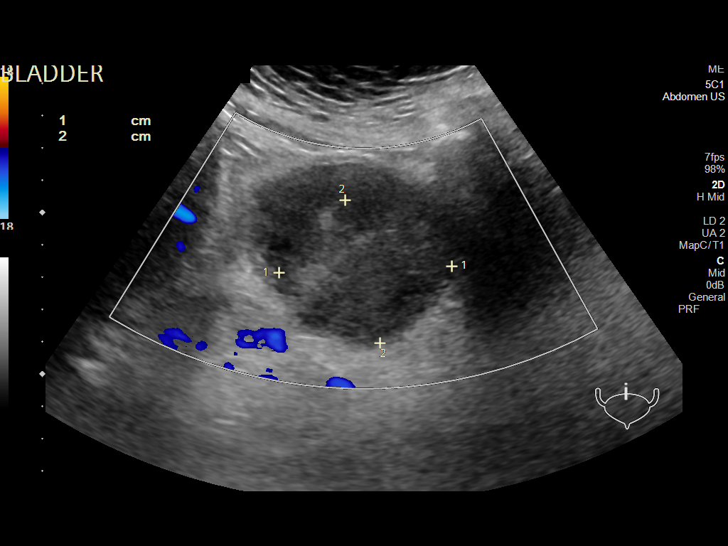
[im 34/38]
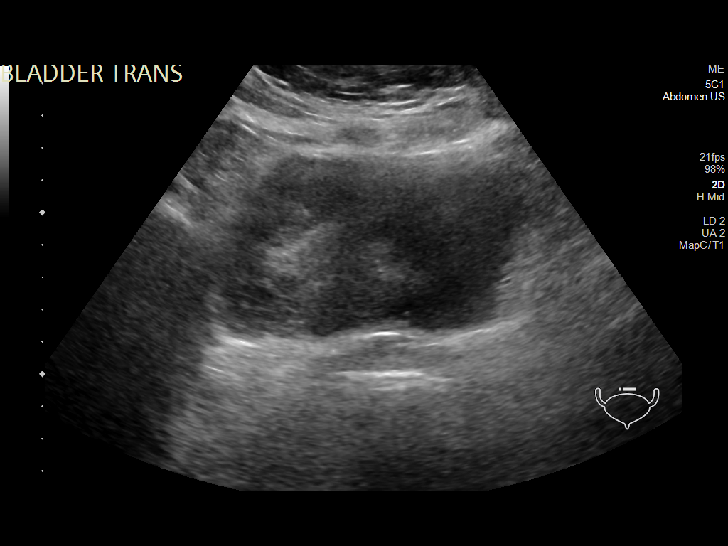
[im 38/38]
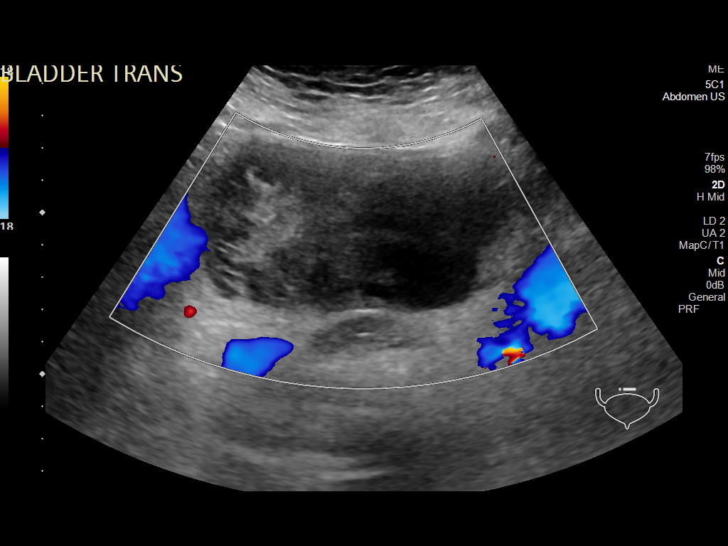

[14 of 25 positions shown; findings below may reference images not displayed]

FINDINGS: Right Kidney:

Renal measurements: 11.7 x 5.4 x 5.9 cm = volume: 198.5 mL .
Echogenicity within normal limits. No mass or hydronephrosis
visualized.

Left Kidney:

Renal measurements: 11 4 x 6.1 x 6 cm = volume: 219.6 mL.
Echogenicity within normal limits. No mass or hydronephrosis
visualized.

Bladder:

There is heterogeneous echogenicity within the lumen measuring 5.4 x
4.6 x 5 cm. No associated flow on color Doppler.

Other:

None.
IMPRESSION: No hydronephrosis.

Heterogeneous echogenicity within the bladder lumen, which may
reflect clot or less likely a mass.

## 2019-08-31 MED ORDER — ASPIRIN EC 325 MG PO TBEC
325.0000 mg | DELAYED_RELEASE_TABLET | Freq: Every day | ORAL | Status: DC
  Administered 2019-08-31: 325 mg via ORAL
  Filled 2019-08-31 (×3): qty 1

## 2019-08-31 MED ORDER — SODIUM CHLORIDE 0.9% IV SOLUTION: Freq: Once | INTRAVENOUS | Status: DC

## 2019-08-31 MED ORDER — INSULIN ASPART 100 UNIT/ML ~~LOC~~ SOLN
0.0000 [IU] | Freq: Three times a day (TID) | SUBCUTANEOUS | Status: DC
  Administered 2019-08-31 – 2019-09-01 (×2): 2 [IU] via SUBCUTANEOUS
  Administered 2019-09-02 – 2019-09-03 (×4): 1 [IU] via SUBCUTANEOUS
  Administered 2019-09-04: 2 [IU] via SUBCUTANEOUS
  Administered 2019-09-04 – 2019-09-05 (×3): 1 [IU] via SUBCUTANEOUS
  Administered 2019-09-05: 2 [IU] via SUBCUTANEOUS
  Administered 2019-09-06 (×2): 1 [IU] via SUBCUTANEOUS
  Administered 2019-09-06: 2 [IU] via SUBCUTANEOUS
  Administered 2019-09-07 (×2): 1 [IU] via SUBCUTANEOUS

## 2019-08-31 MED ORDER — SODIUM CHLORIDE 0.9 % IV SOLN: INTRAVENOUS | Status: DC

## 2019-08-31 MED ORDER — CHLORHEXIDINE GLUCONATE CLOTH 2 % EX PADS
6.0000 | MEDICATED_PAD | Freq: Every day | CUTANEOUS | Status: DC
  Administered 2019-09-01 – 2019-09-07 (×7): 6 via TOPICAL

## 2019-08-31 MED ORDER — METOPROLOL TARTRATE 25 MG PO TABS
25.0000 mg | ORAL_TABLET | Freq: Two times a day (BID) | ORAL | Status: DC
  Administered 2019-08-31 – 2019-09-07 (×14): 25 mg via ORAL
  Filled 2019-08-31 (×14): qty 1

## 2019-08-31 MED ORDER — ATORVASTATIN CALCIUM 80 MG PO TABS
80.0000 mg | ORAL_TABLET | Freq: Every day | ORAL | Status: DC
  Administered 2019-08-31 – 2019-09-07 (×8): 80 mg via ORAL
  Filled 2019-08-31 (×8): qty 1

## 2019-08-31 MED ORDER — AMLODIPINE BESYLATE 5 MG PO TABS
5.0000 mg | ORAL_TABLET | Freq: Every day | ORAL | Status: DC
  Administered 2019-08-31 – 2019-09-02 (×3): 5 mg via ORAL
  Filled 2019-08-31 (×3): qty 1

## 2019-08-31 MED ORDER — ACETAMINOPHEN 650 MG RE SUPP: 650.0000 mg | Freq: Four times a day (QID) | RECTAL | Status: DC | PRN

## 2019-08-31 MED ORDER — ASPIRIN EC 325 MG PO TBEC: 325.0000 mg | DELAYED_RELEASE_TABLET | Freq: Every day | ORAL | Status: DC

## 2019-08-31 MED ORDER — ACETAMINOPHEN 325 MG PO TABS: 650.0000 mg | ORAL_TABLET | Freq: Four times a day (QID) | ORAL | Status: DC | PRN

## 2019-08-31 NOTE — ED Notes (Signed)
Got patient undress into a gown on the monitor patient is resting with call bell in reach did ekg shown to Dr Tomi Bamberger

## 2019-08-31 NOTE — ED Notes (Signed)
Lunch Tray Ordered @ [id number].

## 2019-08-31 NOTE — Evaluation (Signed)
Physical Therapy Evaluation Patient Details Name: Lisa [name] MRN: [id number] DOB: Jun 24, 1950 Today's Date: 08/31/2019   History of Present Illness  Pt is a 69 y/o female admitted secondary to increased abdominal discomfort. Found to have hematuria; workup pending. Pt with recent CVA. PMH includes CVA, HTN and DM.   Clinical Impression  Pt admitted secondary to problem above with deficits below. Pt with cognitive deficits; unsure of baseline as no family present. Min to min guard for steadying for gait within ED room. Noted increased instability with turns. Unsure of family support at home as pt reports her husband does not live with her. Feel she will need 24/7 assist at d/c. If family unable to provide, will likely need SNF. Will continue to follow acutely to maximize functional mobility independence and safety.     Follow Up Recommendations Home health PT;Supervision/Assistance - 24 hour (if pt does not have 24/7, will need SNF )    Equipment Recommendations  Other (comment) (TBD)    Recommendations for Other Services       Precautions / Restrictions Precautions Precautions: Fall Restrictions Weight Bearing Restrictions: No      Mobility  Bed Mobility Overal bed mobility: Modified Independent                Transfers Overall transfer level: Needs assistance Equipment used: None Transfers: Sit to/from Stand Sit to Stand: Min guard         General transfer comment: Min guard for safety.   Ambulation/Gait Ambulation/Gait assistance: Min guard;Min assist Gait Distance (Feet): 10 Feet Assistive device: 1 person hand held assist;None Gait Pattern/deviations: Step-through pattern;Decreased stride length Gait velocity: decreased   General Gait Details: Guarded gait. Distance limited as pt in ED connected to monitors. Notable unsteadiness after turning in room. Required HHA and min to min guard A.   Stairs            Wheelchair Mobility    Modified  Rankin (Stroke Patients Only)       Balance Overall balance assessment: Needs assistance Sitting-balance support: No upper extremity supported;Feet supported Sitting balance-Leahy Scale: Good     Standing balance support: No upper extremity supported;Single extremity supported Standing balance-Leahy Scale: Fair Standing balance comment: Able to maintain static standing.              High level balance activites: Other (comment) High Level Balance Comments: Performed static standing with narrow BOS (feet together) and semi tandem. Pt with increased sway and bracing at times on stretcher. No notable LOB.              Pertinent Vitals/Pain Pain Assessment: Faces Faces Pain Scale: No hurt    Home Living Family/patient expects to be discharged to:: Private residence Living Arrangements: Spouse/significant other Available Help at Discharge: Family Type of Home: House Home Access: Stairs to enter Entrance Stairs-Rails: None Entrance Stairs-Number of Steps: 3 Home Layout: Two level Home Equipment: Shower seat Additional Comments: Information put in from last session. Pt reports husband does not live with her, unsure of accuracy as pt with cognitive deficits.     Prior Function Level of Independence: Independent         Comments: Unsure of accuracy as pt with recent CVA and no caregiver present to confirm accuracy.      Hand Dominance        Extremity/Trunk Assessment   Upper Extremity Assessment Upper Extremity Assessment: Defer to OT evaluation;RUE deficits/detail RUE Deficits / Details: Shoulder and elbow flexion with  notable weakness.     Lower Extremity Assessment Lower Extremity Assessment: RLE deficits/detail RLE Deficits / Details: PF 4-/5, DF 3/5, knee flexors 4-/5. R weakness at baseline     Cervical / Trunk Assessment Cervical / Trunk Assessment: Normal  Communication   Communication: Expressive difficulties  Cognition Arousal/Alertness:  Awake/alert Behavior During Therapy: WFL for tasks assessed/performed Overall Cognitive Status: No family/caregiver present to determine baseline cognitive functioning                                 General Comments: Pt with difficulty sequencing and required multimodal cues. Was able to report place and situation. Did not know her name or birthday, and did not know time. Unsure of pt's baseline.       General Comments General comments (skin integrity, edema, etc.): No family present     Exercises     Assessment/Plan    PT Assessment Patient needs continued PT services  PT Problem List Decreased strength;Decreased balance;Decreased mobility;Decreased cognition;Decreased knowledge of use of DME;Decreased safety awareness;Decreased activity tolerance       PT Treatment Interventions Therapeutic activities;Cognitive remediation;Gait training;Therapeutic exercise;Patient/family education;Stair training;Balance training;Functional mobility training;Neuromuscular re-education    PT Goals (Current goals can be found in the Care Plan section)  Acute Rehab PT Goals Patient Stated Goal: to go home PT Goal Formulation: With patient Time For Goal Achievement: 09/14/19 Potential to Achieve Goals: Good    Frequency Min 3X/week   Barriers to discharge Other (comment) Unsure of caregiver support     Co-evaluation               AM-PAC PT "6 Clicks" Mobility  Outcome Measure Help needed turning from your back to your side while in a flat bed without using bedrails?: None Help needed moving from lying on your back to sitting on the side of a flat bed without using bedrails?: None Help needed moving to and from a bed to a chair (including a wheelchair)?: A Little Help needed standing up from a chair using your arms (e.g., wheelchair or bedside chair)?: A Little Help needed to walk in hospital room?: A Little Help needed climbing 3-5 steps with a railing? : A Lot 6 Click  Score: 19    End of Session Equipment Utilized During Treatment: Gait belt Activity Tolerance: Patient tolerated treatment well Patient left: in bed;with call bell/phone within reach Nurse Communication: Mobility status PT Visit Diagnosis: Unsteadiness on feet (R26.81);Difficulty in walking, not elsewhere classified (R26.2)    Time: 5397-6734 PT Time Calculation (min) (ACUTE ONLY): 30 min   Charges:   PT Evaluation $PT Eval Moderate Complexity: 1 Mod PT Treatments $Therapeutic Activity: 8-22 mins        Lou Miner, DPT  Acute Rehabilitation Services  Pager: [phone] Office: [phone]   Rudean Hitt 08/31/2019, 6:47 PM

## 2019-08-31 NOTE — Progress Notes (Signed)
Spoke with Dr. Claudia Desanctis, urology, in reference to if she needs to maintain NPO.  She reports she is hopeful to see her later this evening and that the patient can have a diet with NPO at midnight order placed.   Additionally noted urinalysis has not been collected, called RN who will obtain this.  Patriciaann Clan, DO

## 2019-08-31 NOTE — ED Notes (Signed)
Placed a external cath on patient. Patient has call bell in reach

## 2019-08-31 NOTE — H&P (View-Only) (Signed)
I have been asked to see the patient by Dr. Darrelyn Hillock, for evaluation and management of gross hematuria.  History of present illness: 69 year old woman with history of recent CVA currently on aspirin and Plavix presented to the ER with upper abdominal discomfort.  She is found to be anemic on admission with a hemoglobin of 9.1 significantly decreased from 14.5 on 7/8.  Patient has not had any difficulty emptying her bladder but does report gross hematuria for the past  2 weeks.  Patient also with an AKI and creatinine of 2.02 increased from 0.56 on 7/8.   Review of systems: A 12 point comprehensive review of systems was obtained and is negative unless otherwise stated in the history of present illness.  Patient Active Problem List   Diagnosis Date Noted  . Hematuria 08/31/2019  . 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 Stenosis of carotid artery   . Abnormality of aortic valve   . DM (diabetes mellitus), type 2, uncontrolled w/neurologic complication ([place])   . Hypercholesteremia   . CVA (cerebral vascular accident) ([place]) 08/12/2019  . Acute ischemic stroke ([place])   . Hypertension     No current facility-administered medications on file prior to encounter.   Current Outpatient Medications on File Prior to Encounter  Medication Sig Dispense Refill  . amLODipine (NORVASC) 5 MG tablet Take 1 tablet (5 mg total) by mouth daily. 90 tablet 2  . aspirin EC 325 MG EC tablet Take 1 tablet (325 mg total) by mouth daily. 30 tablet 0  . atorvastatin (LIPITOR) 80 MG tablet Take 1 tablet (80 mg total) by mouth daily. 30 tablet 0  . clopidogrel (PLAVIX) 75 MG tablet Take 1 tablet (75 mg total) by mouth daily. 30 tablet 0  . losartan (COZAAR) 50 MG tablet Take 1 tablet (50 mg total) by mouth daily. 30 tablet 0  . metFORMIN (GLUCOPHAGE XR) 500 MG 24 hr tablet Take 1 tablet (500 mg total) by mouth daily with breakfast. 30 tablet 0  . metoprolol tartrate  (LOPRESSOR) 25 MG tablet Take 1 tablet (25 mg total) by mouth 2 (two) times daily. 60 tablet 3  . MULTIPLE VITAMIN PO Take 1 tablet by mouth daily.    Marland Kitchen HYDROcodone-acetaminophen (NORCO/VICODIN) 5-325 MG tablet Take 1 tablet by mouth every 4 (four) hours as needed for moderate pain. 6 tablet 0    Past Medical History:  Diagnosis Date  . Carotid arterial disease ([place]) 07/2019  . Diabetes ([place])   . HTN (hypertension)   . Hyperlipidemia   . Stroke [facility])    acute/subacute left MCA infarct 08/12/19    Past Surgical History:  Procedure Laterality Date  . TONSILLECTOMY    . TRANSCAROTID ARTERY REVASCULARIZATION Left 08/20/2019   Procedure: LEFT TRANSCAROTID ARTERY REVASCULARIZATION;  Surgeon: Waynetta Sandy, MD;  Location: [place];  Service: Vascular;  Laterality: Left;    Social History   Tobacco Use  . Smoking status: Former Smoker    Packs/day: 0.50    Types: Cigarettes    Quit date: 07/28/2019    Years since quitting: 0.0  . Smokeless tobacco: Never Used  Vaping Use  . Vaping Use: Never used  Substance Use Topics  . Alcohol use: Not Currently  . Drug use: Never    Family History  Problem Relation Age of Onset  . Hypertension Mother   . Lung cancer Father     PE: Vitals:   08/31/19 1306 08/31/19 1400 08/31/19 1706  08/31/19 1815  BP: (!) 142/62 131/62 133/68 126/75  Pulse: (!) 29 95 81 83  Resp: (!) 27 (!) 22 19 15   Temp:      TempSrc:      SpO2: (!) 71% 98% 95% 95%  Weight:      Height:       Patient appears to be in no acute distress  patient is alert and oriented x3 Atraumatic normocephalic head No cervical or supraclavicular lymphadenopathy appreciated No increased work of breathing Regular sinus rhythm/rate Abdomen is soft, nontender, nondistended, no CVA or suprapubic tenderness Lower extremities are symmetric without appreciable edema Grossly neurologically intact No identifiable skin lesions  Recent Labs    08/31/19 0855  WBC 17.4*  HGB  9.1*  HCT 28.4*   Recent Labs    08/31/19 0855  NA 135  K 3.3*  CL 97*  CO2 22  GLUCOSE 157*  BUN 28*  CREATININE 2.02*  CALCIUM 9.1   Recent Labs    08/31/19 0855  INR 1.1   No results for input(s): LABURIN in the last 72 hours. Results for orders placed or performed during the hospital encounter of 08/31/19  SARS Coronavirus 2 by RT PCR (hospital order, performed in [facility] hospital lab) Nasopharyngeal Nasopharyngeal Swab     Status: None   Collection Time: 08/31/19  1:47 PM   Specimen: Nasopharyngeal Swab  Result Value Ref Range Status   SARS Coronavirus 2 NEGATIVE NEGATIVE Final    Comment: (NOTE) SARS-CoV-2 target nucleic acids are NOT DETECTED.  The SARS-CoV-2 RNA is generally detectable in upper and lower respiratory specimens during the acute phase of infection. The lowest concentration of SARS-CoV-2 viral copies this assay can detect is 250 copies / mL. A negative result does not preclude SARS-CoV-2 infection and should not be used as the sole basis for treatment or other patient management decisions.  A negative result may occur with improper specimen collection / handling, submission of specimen other than nasopharyngeal swab, presence of viral mutation(s) within the areas targeted by this assay, and inadequate number of viral copies (<250 copies / mL). A negative result must be combined with clinical observations, patient history, and epidemiological information.  Fact Sheet for Patients:   StrictlyIdeas.no  Fact Sheet for Healthcare Providers: BankingDealers.co.za  This test is not yet approved or  cleared by the [place] FDA and has been authorized for detection and/or diagnosis of SARS-CoV-2 by FDA under an Emergency Use Authorization (EUA).  This EUA will remain in effect (meaning this test can be used) for the duration of the COVID-19 declaration under Section 564(b)(1) of the Act, 21  U.S.C. section 360bbb-3(b)(1), unless the authorization is terminated or revoked sooner.  Performed at [facility], [place] [address] [place], [place] [address]     Imaging: Renal US 08/31/19 IMPRESSION: No hydronephrosis.  Heterogeneous echogenicity within the bladder lumen, which may reflect clot or less likely a mass.  A/P: 69 year old woman with recent CVA currently on Plavix and aspirin now with gross hematuria and acute blood loss anemia.  Imaging consistent with clot in bladder however patient has not had obstruction.  -48 French Foley catheter was placed in the ER and patient was irrigated with approximately 2 L of sterile water -Moderate clot burden was removed and urine lightened in color -Foley catheter was left to gravity drainage -Recommend n.p.o. at midnight in the event that she needs intervention -will follow Hgb and creatinine  Thank you for involving me in this  patient's care, I will continue to follow along.  Please page with any further questions or concerns. Klyn Kroening D Quintella Mura

## 2019-08-31 NOTE — ED Triage Notes (Signed)
Pt arrived with t-shirt stained in blood . Pt reports the vaginal bleeding has   has been going on for some time. Pt unsure when Vaginal bleeding started.Pt unable to answer questions such as DOB, Day of week ,year. Pt apperas to be confused x4.

## 2019-08-31 NOTE — Care Management (Signed)
ED CM team received consult from ED Director concerning patient and family having questions regarding HCPOA.  TOC Team RNCM/CSW met with patient at bedside who reported wanting to have her sister- in-law Preston Fleeting [phone]  on the record as HCPOA. CM/ CSW asked did she have paperwork already she stated she would like to complete paperwork.  Patient reports she lives at home with husband but he is not always there.  We were also told that she was refusing visitation from her husband, but  Patient states that she does not want to bar her husband from visitation. She tells [place] that  for the next 2 days he has something important that he needs to focus on.  Permission given to speak with sister in law, CSW/ CM contacted [name] and explained that we have Advance directive forms at the hospital which can be completed with the  assistance of the [employer]. Sister is also concerned about patient being discharged home  [facility] vs SNF. Discussed that patient will be admitted to a room soon and we will update this information in a handoff. We will also leave the Advance Directive forms and CMS SNF and [facility] list. Unit TOC will continue to follow for transitional care planning.

## 2019-08-31 NOTE — Consult Note (Signed)
I have been asked to see the patient by Dr. Darrelyn Hillock, for evaluation and management of gross hematuria.  History of present illness: 69 year old woman with history of recent CVA currently on aspirin and Plavix presented to the ER with upper abdominal discomfort.  She is found to be anemic on admission with a hemoglobin of 9.1 significantly decreased from 14.5 on 7/8.  Patient has not had any difficulty emptying her bladder but does report gross hematuria for the past  2 weeks.  Patient also with an AKI and creatinine of 2.02 increased from 0.56 on 7/8.   Review of systems: A 12 point comprehensive review of systems was obtained and is negative unless otherwise stated in the history of present illness.  Patient Active Problem List   Diagnosis Date Noted  . Hematuria 08/31/2019  . 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 Stenosis of carotid artery   . Abnormality of aortic valve   . DM (diabetes mellitus), type 2, uncontrolled w/neurologic complication ([place])   . Hypercholesteremia   . CVA (cerebral vascular accident) ([place]) 08/12/2019  . Acute ischemic stroke ([place])   . Hypertension     No current facility-administered medications on file prior to encounter.   Current Outpatient Medications on File Prior to Encounter  Medication Sig Dispense Refill  . amLODipine (NORVASC) 5 MG tablet Take 1 tablet (5 mg total) by mouth daily. 90 tablet 2  . aspirin EC 325 MG EC tablet Take 1 tablet (325 mg total) by mouth daily. 30 tablet 0  . atorvastatin (LIPITOR) 80 MG tablet Take 1 tablet (80 mg total) by mouth daily. 30 tablet 0  . clopidogrel (PLAVIX) 75 MG tablet Take 1 tablet (75 mg total) by mouth daily. 30 tablet 0  . losartan (COZAAR) 50 MG tablet Take 1 tablet (50 mg total) by mouth daily. 30 tablet 0  . metFORMIN (GLUCOPHAGE XR) 500 MG 24 hr tablet Take 1 tablet (500 mg total) by mouth daily with breakfast. 30 tablet 0  . metoprolol tartrate  (LOPRESSOR) 25 MG tablet Take 1 tablet (25 mg total) by mouth 2 (two) times daily. 60 tablet 3  . MULTIPLE VITAMIN PO Take 1 tablet by mouth daily.    Marland Kitchen HYDROcodone-acetaminophen (NORCO/VICODIN) 5-325 MG tablet Take 1 tablet by mouth every 4 (four) hours as needed for moderate pain. 6 tablet 0    Past Medical History:  Diagnosis Date  . Carotid arterial disease ([place]) 07/2019  . Diabetes ([place])   . HTN (hypertension)   . Hyperlipidemia   . Stroke [facility])    acute/subacute left MCA infarct 08/12/19    Past Surgical History:  Procedure Laterality Date  . TONSILLECTOMY    . TRANSCAROTID ARTERY REVASCULARIZATION Left 08/20/2019   Procedure: LEFT TRANSCAROTID ARTERY REVASCULARIZATION;  Surgeon: Waynetta Sandy, MD;  Location: [place];  Service: Vascular;  Laterality: Left;    Social History   Tobacco Use  . Smoking status: Former Smoker    Packs/day: 0.50    Types: Cigarettes    Quit date: 07/28/2019    Years since quitting: 0.0  . Smokeless tobacco: Never Used  Vaping Use  . Vaping Use: Never used  Substance Use Topics  . Alcohol use: Not Currently  . Drug use: Never    Family History  Problem Relation Age of Onset  . Hypertension Mother   . Lung cancer Father     PE: Vitals:   08/31/19 1306 08/31/19 1400 08/31/19 1706  08/31/19 1815  BP: (!) 142/62 131/62 133/68 126/75  Pulse: (!) 29 95 81 83  Resp: (!) 27 (!) 22 19 15   Temp:      TempSrc:      SpO2: (!) 71% 98% 95% 95%  Weight:      Height:       Patient appears to be in no acute distress  patient is alert and oriented x3 Atraumatic normocephalic head No cervical or supraclavicular lymphadenopathy appreciated No increased work of breathing Regular sinus rhythm/rate Abdomen is soft, nontender, nondistended, no CVA or suprapubic tenderness Lower extremities are symmetric without appreciable edema Grossly neurologically intact No identifiable skin lesions  Recent Labs    08/31/19 0855  WBC 17.4*  HGB  9.1*  HCT 28.4*   Recent Labs    08/31/19 0855  NA 135  K 3.3*  CL 97*  CO2 22  GLUCOSE 157*  BUN 28*  CREATININE 2.02*  CALCIUM 9.1   Recent Labs    08/31/19 0855  INR 1.1   No results for input(s): LABURIN in the last 72 hours. Results for orders placed or performed during the hospital encounter of 08/31/19  SARS Coronavirus 2 by RT PCR (hospital order, performed in [facility] hospital lab) Nasopharyngeal Nasopharyngeal Swab     Status: None   Collection Time: 08/31/19  1:47 PM   Specimen: Nasopharyngeal Swab  Result Value Ref Range Status   SARS Coronavirus 2 NEGATIVE NEGATIVE Final    Comment: (NOTE) SARS-CoV-2 target nucleic acids are NOT DETECTED.  The SARS-CoV-2 RNA is generally detectable in upper and lower respiratory specimens during the acute phase of infection. The lowest concentration of SARS-CoV-2 viral copies this assay can detect is 250 copies / mL. A negative result does not preclude SARS-CoV-2 infection and should not be used as the sole basis for treatment or other patient management decisions.  A negative result may occur with improper specimen collection / handling, submission of specimen other than nasopharyngeal swab, presence of viral mutation(s) within the areas targeted by this assay, and inadequate number of viral copies (<250 copies / mL). A negative result must be combined with clinical observations, patient history, and epidemiological information.  Fact Sheet for Patients:   StrictlyIdeas.no  Fact Sheet for Healthcare Providers: BankingDealers.co.za  This test is not yet approved or  cleared by the [place] FDA and has been authorized for detection and/or diagnosis of SARS-CoV-2 by FDA under an Emergency Use Authorization (EUA).  This EUA will remain in effect (meaning this test can be used) for the duration of the COVID-19 declaration under Section 564(b)(1) of the Act, 21  U.S.C. section 360bbb-3(b)(1), unless the authorization is terminated or revoked sooner.  Performed at [facility], [place] [address], [place], [place] [address]     Imaging: Renal US 08/31/19 IMPRESSION: No hydronephrosis.  Heterogeneous echogenicity within the bladder lumen, which may reflect clot or less likely a mass.  A/P: 69 year old woman with recent CVA currently on Plavix and aspirin now with gross hematuria and acute blood loss anemia.  Imaging consistent with clot in bladder however patient has not had obstruction.  -2 French Foley catheter was placed in the ER and patient was irrigated with approximately 2 L of sterile water -Moderate clot burden was removed and urine lightened in color -Foley catheter was left to gravity drainage -Recommend n.p.o. at midnight in the event that she needs intervention -will follow Hgb and creatinine  Thank you for involving me in this  patient's care, I will continue to follow along.  Please page with any further questions or concerns. Shahzain Kiester D Verneta Hamidi

## 2019-08-31 NOTE — ED Provider Notes (Signed)
[facility] EMERGENCY DEPARTMENT Provider Note   CSN: [id number] Arrival date & time: 08/31/19  5852     History Chief Complaint  Patient presents with  . funny feeling in chest  . Vaginal Bleeding    Lisa [name] is a 69 y.o. female.  HPI   Pt recently in the hospital for acute stroke causing an aphasia.  This is causing her some difficulty in providing history. Pt denies having pain right now.  She states sometimes she does and sometimes she does not.    This morning however it did bother her and she was having symptoms the previous few days.  Pt keeps saying it is in her stomach but she will point to her chest.  No fever or chills.  No cough.  Pt also started having some bleeding.  Patient is not sure if it is from the rectal or vaginal area.  Patient is able to tell me that started after her recent hospitalization.  Patient states her husband is coming to the ED although he is not here at this time.  Patient is having a difficult time tell me specifically where the bleeding is coming from.  Past Medical History:  Diagnosis Date  . Carotid arterial disease ([place]) 07/2019  . Diabetes ([place])   . HTN (hypertension)   . Hyperlipidemia   . Stroke [facility])    acute/subacute left MCA infarct 08/12/19    Patient Active Problem List   Diagnosis Date Noted  . 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 Stenosis of carotid artery   . Abnormality of aortic valve   . DM (diabetes mellitus), type 2, uncontrolled w/neurologic complication ([place])   . Hypercholesteremia   . CVA (cerebral vascular accident) ([place]) 08/12/2019  . Acute ischemic stroke ([place])   . Hypertension     Past Surgical History:  Procedure Laterality Date  . TONSILLECTOMY    . TRANSCAROTID ARTERY REVASCULARIZATION Left 08/20/2019   Procedure: LEFT TRANSCAROTID ARTERY REVASCULARIZATION;  Surgeon: Waynetta Sandy, MD;  Location: [place];  Service:  Vascular;  Laterality: Left;     OB History   No obstetric history on file.     Family History  Problem Relation Age of Onset  . Hypertension Mother   . Lung cancer Father     Social History   Tobacco Use  . Smoking status: Former Smoker    Packs/day: 0.50    Types: Cigarettes    Quit date: 07/28/2019    Years since quitting: 0.0  . Smokeless tobacco: Never Used  Vaping Use  . Vaping Use: Never used  Substance Use Topics  . Alcohol use: Not Currently  . Drug use: Never    Home Medications Prior to Admission medications   Medication Sig Start Date End Date Taking? Authorizing Provider  amLODipine (NORVASC) 5 MG tablet Take 1 tablet (5 mg total) by mouth daily. 08/19/19 11/17/19 Yes Barrett, Evelene Croon, PA-C  aspirin EC 325 MG EC tablet Take 1 tablet (325 mg total) by mouth daily. 08/15/19  Yes Autry-Lott, Naaman Plummer, DO  atorvastatin (LIPITOR) 80 MG tablet Take 1 tablet (80 mg total) by mouth daily. 08/15/19  Yes Autry-Lott, Naaman Plummer, DO  clopidogrel (PLAVIX) 75 MG tablet Take 1 tablet (75 mg total) by mouth daily. 08/15/19  Yes Autry-Lott, Naaman Plummer, DO  losartan (COZAAR) 50 MG tablet Take 1 tablet (50 mg total) by mouth daily. 08/15/19  Yes Autry-Lott, Naaman Plummer, DO  metFORMIN (GLUCOPHAGE XR) 500  MG 24 hr tablet Take 1 tablet (500 mg total) by mouth daily with breakfast. 08/14/19  Yes Autry-Lott, Naaman Plummer, DO  metoprolol tartrate (LOPRESSOR) 25 MG tablet Take 1 tablet (25 mg total) by mouth 2 (two) times daily. 08/19/19 11/17/19 Yes Barrett, Evelene Croon, PA-C  MULTIPLE VITAMIN PO Take 1 tablet by mouth daily.   Yes [provider]  HYDROcodone-acetaminophen (NORCO/VICODIN) 5-325 MG tablet Take 1 tablet by mouth every 4 (four) hours as needed for moderate pain. 08/21/19 08/20/20  Karoline Caldwell, PA-C    Allergies    Patient has no known allergies.  Review of Systems   Review of Systems  All other systems reviewed and are negative.   Physical Exam Updated Vital Signs BP 137/73   Pulse 91    Temp 98.5 F (36.9 C) (Oral)   Resp (!) 24   Ht 1.626 m (5\' 4" )   Wt 87.3 kg   SpO2 99%   BMI 33.04 kg/m   Physical Exam Vitals and nursing note reviewed.  Constitutional:      General: She is not in acute distress.    Appearance: She is well-developed.  HENT:     Head: Normocephalic and atraumatic.     Right Ear: External ear normal.     Left Ear: External ear normal.  Eyes:     General: No scleral icterus.       Right eye: No discharge.        Left eye: No discharge.     Conjunctiva/sclera: Conjunctivae normal.  Neck:     Trachea: No tracheal deviation.  Cardiovascular:     Rate and Rhythm: Normal rate and regular rhythm.  Pulmonary:     Effort: Pulmonary effort is normal. No respiratory distress.     Breath sounds: Normal breath sounds. No stridor. No wheezing or rales.  Abdominal:     General: Bowel sounds are normal. There is no distension.     Palpations: Abdomen is soft.     Tenderness: There is no abdominal tenderness. There is no guarding or rebound.  Genitourinary:    Comments: Rectal exam performed, brown stool no gross blood, dried blood noted in the perineum and on her legs, external vaginal area examined, no active bleeding, no gross blood noted in external vaginal vault Musculoskeletal:        General: No tenderness.     Cervical back: Neck supple.  Skin:    General: Skin is warm and dry.     Findings: No rash.  Neurological:     Mental Status: She is alert.     Cranial Nerves: No cranial nerve deficit (no facial droop, extraocular movements intact, no slurred speech, aphasia, difficulty finding words, saying incorrect words).     Sensory: No sensory deficit.     Motor: No abnormal muscle tone or seizure activity.     Coordination: Coordination normal.     ED Results / Procedures / Treatments   Labs (all labs ordered are listed, but only abnormal results are displayed) Labs Reviewed  BASIC METABOLIC PANEL - Abnormal; Notable for the following  components:      Result Value   Potassium 3.3 (*)    Chloride 97 (*)    Glucose, Bld 157 (*)    BUN 28 (*)    Creatinine, Ser 2.02 (*)    GFR calc non Af Amer 25 (*)    GFR calc Af Amer 29 (*)    Anion gap 16 (*)    All  other components within normal limits  CBC - Abnormal; Notable for the following components:   WBC 17.4 (*)    RBC 3.15 (*)    Hemoglobin 9.1 (*)    HCT 28.4 (*)    Platelets 465 (*)    All other components within normal limits  CBG MONITORING, ED - Abnormal; Notable for the following components:   Glucose-Capillary 149 (*)    All other components within normal limits  PROTIME-INR  URINALYSIS, ROUTINE W REFLEX MICROSCOPIC  TYPE AND SCREEN  TROPONIN I (HIGH SENSITIVITY)  TROPONIN I (HIGH SENSITIVITY)    EKG EKG Interpretation  Date/Time:  Monday August 31 2019 08:28:35 EDT Ventricular Rate:  97 PR Interval:    QRS Duration: 116 QT Interval:  342 QTC Calculation: 435 R Axis:   -37 Text Interpretation: Sinus tachycardia Multiform ventricular premature complexes, new since last tracing Nonspecific IVCD with LAD LVH with secondary repolarization abnormality Anterior Q waves, possibly due to LVH Confirmed by Dorie Rank [phone]) on 08/31/2019 8:33:18 AM   Radiology DG Chest Portable 1 View  Result Date: 08/31/2019 CLINICAL DATA:  Chest pain EXAM: PORTABLE CHEST 1 VIEW COMPARISON:  None. FINDINGS: The heart size and mediastinal contours are within normal limits. Both lungs are clear. No pleural effusion or pneumothorax. The visualized skeletal structures are unremarkable. IMPRESSION: No acute process in the chest. Electronically Signed   By: Macy Mis M.D.   On: 08/31/2019 09:12    Procedures .Critical Care Performed by: Dorie Rank, MD Authorized by: Dorie Rank, MD   Critical care provider statement:    Critical care time (minutes):  30   Critical care was time spent personally by me on the following activities:  Discussions with consultants, evaluation  of patient's response to treatment, examination of patient, ordering and performing treatments and interventions, ordering and review of laboratory studies, ordering and review of radiographic studies, pulse oximetry, re-evaluation of patient's condition, obtaining history from patient or surrogate and review of old charts   (including critical care time)  Medications Ordered in ED Medications - No data to display  ED Course  I have reviewed the triage vital signs and the nursing notes.  Pertinent labs & imaging results that were available during my care of the patient were reviewed by me and considered in my medical decision making (see chart for details).  Clinical Course as of Aug 30 1120  Mon Aug 31, 2019  1028 Prior imaging tests reviewed.  Aortic arch: Standard aortic branching. Atherosclerotic plaque within the visualized aortic arch and proximal major branch vessels of the neck. No hemodynamically significant innominate or proximal subclavian artery stenosis.   [XB]  9390 Patient appears to have gross hematuria.  Pure wick was placed and bloody urine noted within the suction container   [JK]  1033 Troponin I (High Sensitivity) [JK]  1053 Had a discussion with both the patient and family member.  Medical records were reviewed further.  Patient was concerned about an issue with her aorta based on her prior hospitalization.  After reviewing the notes and discussing with the patient and the family apparently initially they wanted to do a transesophageal echocardiogram prior to having her carotid endarterectomy.  Ultimately they ended up doing her carotid endarterectomy without doing the TEE.  It appears that is the aortic issue that the patient was concerned about.  I doubt this is related to her chest pain complaint.   [ZE]  0923 Discussed with urology.  Will consult on patient   [  JK]    Clinical Course User Index [JK] Dorie Rank, MD   MDM Rules/Calculators/A&P                           Patient presented to the ED for evaluation of bleeding as well as chest pain.  History is somewhat difficult because of the patient's aphasia associated with her recent stroke.  Family was able to provide additional history.  Patient was complaining of chest pain.  No acute findings noted on EKG.  Patient's first troponin is normal.  Will check delta troponin and continue to monitor.  Right now symptoms are not suggestive of aortic dissection or pulmonary embolism.  It is possible she may be has some acid reflux issues but we will need to continue to monitor serial troponins.  Patient also presented with bleeding.  It appears to be urinary in nature.  No rectal bleeding was noted and she did not have any vaginal bleeding noted on exam.  Patient does have an AKI as well as a drop in her hemoglobin.  She does not require transfusion at this time will need to monitor closely.  I will order renal ultrasound to assess for any mass in the kidney or bladder.  Also will consult with urology.  I will consult the medical service for admission.  Suspect the patient's bleeding is exacerbated by her recent anticoagulants. Final Clinical Impression(s) / ED Diagnoses Final diagnoses:  Hematuria, unspecified type  Anemia, unspecified type  Chest pain, unspecified type      Dorie Rank, MD 08/31/19 1123

## 2019-08-31 NOTE — Progress Notes (Signed)
Patient with cardiac monitoring orders but on Med-Surg.  Nurse asking for transfer to Tele as would be appropriate level of care given need for tele.  Transfer order placed.  [place] Constable, D.O.  PGY-3 Family Medicine  08/31/2019 9:12 PM

## 2019-08-31 NOTE — ED Triage Notes (Signed)
Patient arrived via [facility] from home stating she has a funny feeling in her chest. Denies chest pain stating she had a stroke 2 weeks ago and was told she has a growth on her aorta and she has been having this feeling since then.  Per EMS 12 lead good. Patient Alert and oriented x2 per husband that's baseline since stroke.  EMS  VS- 154/70, 98. 16, 98% on room air. CBG-141.

## 2019-08-31 NOTE — Telephone Encounter (Signed)
Patient is currently in the ER. 

## 2019-08-31 NOTE — Progress Notes (Signed)
FPTS Interim Progress Note  Patient's Hgb 7.9.  With CVD, transfusion threshold is 8.  Patient seen at bedside and discussed that she meets criteria for transfusion.  She agrees.  Will order 1u pRBC and post H&H.  [place], DO 08/31/2019, 10:34 PM PGY-3, [place] Service pager [phone]

## 2019-08-31 NOTE — ED Notes (Signed)
Got patient a cup of water patient is resting with family at bedside and call bell in reach

## 2019-08-31 NOTE — H&P (Signed)
[facility] Admission History and Physical Service Pager: [phone]  Patient name: Lisa [name] Medical record number: [id number] Date of birth: 16-Mar-1950 Age: 69 y.o. Gender: female  Primary Care Provider: Patient, No Pcp Per Consultants: Urology Code Status: Full code Preferred Emergency Contact: [name] Wentz   Chief Complaint: Chest discomfort hematuria  Assessment and Plan: Lisa [name] is a 69 y.o. female presenting with upper abdominal discomfort.  Patient was then found to be anemic on admission with gross hematuria PMH is significant for recent ischemic CVA, recent carotid stent placement, hypertension, hyperlipidemia, type 2 diabetes.  Anemia 2/2 hematuria Patient presented to the emergency department for abdominal discomfort.  Lab work showed anemia with hemoglobin of 9.1 from 14.5 on 7/8.  She reports hematuria and her blood "since her last hospitalization".  Patient acknowledges increased frequency and volume of urine but denies any dysuria.  Patient has difficulty finding words 2/2 recent CVA.  Lab work significant for renal ultrasound significant for heterogeneous echogenicity within the bladder lumen which may reflect clot or less likely a mass.  Unsure of etiology of bleeding at this time, urology has been consulted and will evaluate for source of bleeding.  Differential includes renal cell carcinoma given patient's significant smoking history, this could also be purely related to the patient's dual antiplatelet therapy.  Patient denies any recent injuries, falls making traumatic renal injury less likely. -Admit inpatient teaching service with Dr. Ardelia Mems is the attending -Urology consulted, appreciate recommendations -Urology to see the patient today -Vitals per routine -Holding Plavix at this time but continuing aspirin 325 mg -Repeat CBC tonight including differential -Morning CBCs -Morning BMPs -Continuous cardiac  monitoring -Continuous pulse ox -Up with assistance -Strict I's and O's -Follow-up on UA -Consider urine culture  AKI Creatinine on admission of 2.02 up from 0.56 on 08/20/2019.  Patient with gross hematuria at this time.. -Holding home nephrotoxic medications at this time -See above for plan  Abdominal discomfort Patient reports she has had intermittent irregular abdominal discomfort.  Due to her difficulty with words she is unable to describe it more than that.  She says that it has been happening over the past 4 days but has completely resolved at this time.  Physical exam showed positive bowel sounds, no abdominal distention, no tenderness to palpation.  EKG showed sinus tachycardia with PACs but no ST elevations, there was a lot of motion in the EKG.  Troponins were trended and were 13> 13. -Monitor for change in status -Repeat EKG due to motion artifact in original EKG  History of recent ischemic CVA Patient presented with acute CVA on 6/30.  Patient with right-sided weakness which she reports is improving since her CVA.  Patient also has difficulty with words.  Patient was seen and evaluated by neurology who recommended Plavix and aspirin for 3 months.  Discussed case with neurology who recommended holding Plavix at this time.  If she continues to bleed they are okay with holding the aspirin as well. -Holding Plavix -Continue aspirin 325 daily -Repeat CBC this afternoon -PT/OT eval and treat   History of recent carotid stent Patient with carotid stent placement by Dr. Gwenlyn Saran from vascular surgery on 08/20/2019.  Patient was continued on her aspirin and Plavix for this.  Reached out to vascular surgery for recommendations regarding anticoagulation.  Vascular surgery request that we continue at least the aspirin but can hold the Plavix at this time. -Vascular surgery curbside, appreciate recommendations -Continue aspirin 325 mg at this  time -Holding Plavix due to gross  hematuria  Hypertension Blood pressure on arrival 134/70.  Most recent blood pressure was 136/68.  Blood pressures have ranged since arrival from 134/70-1 44/113.  Patient was started on losartan 25 mg, metoprolol, amlodipine at discharge from her last hospitalization. -Continue to monitor blood pressure -Continue home metoprolol and amlodipine -Holding home losartan at this time due to AKI -May require additional hypertensive medications  Hyperlipidemia Patient had a panel performed on 7/1 which resulted in total cholesterol of 229, HDL 38, LDL 158, triglycerides 164.  She was started on atorvastatin 80 mg daily -Continue home atorvastatin 80 mg daily -Heart healthy carb modified diet  Type 2 diabetes mellitus Hemoglobin A1c on admission for CVA was 8.2.  Patient was discharged on Metformin 500 mg daily. -Holding home Metformin at this time due to AKI -Sensitive sliding scale insulin  FEN/GI: Heart healthy carb modified Prophylaxis: SCDs  Disposition: Admit to inpatient teaching service  History of Present Illness:  Lisa [name] is a 69 y.o. female presenting with with reported abdominal discomfort which has resolved.  Patient has history of recent CVA and has difficulty finding words.  Patient reports it was a funny feeling in her upper abdomen but she cannot describe that feeling.  It has been going on for a few days but has now resolved.  When asked about the blood in her urine she reports that she was unsure if it was coming from her rectum, vagina or urine but that it has been in her depends.  She has had incontinence since her stroke.  The patient reports that the blood has been there since her recent hospitalization.  It is difficult to discern if she is referring to her hospitalization for her stroke or for her carotid stent.  Review Of Systems: Per HPI with the following additions:   Review of Systems  Constitutional: Positive for fatigue. Negative for chills and fever.   HENT: Negative for congestion, rhinorrhea and tinnitus.   Eyes: Negative for visual disturbance.  Respiratory: Negative for cough, chest tightness, shortness of breath and wheezing.   Cardiovascular: Negative for chest pain, palpitations and leg swelling.  Gastrointestinal: Negative for constipation, diarrhea, nausea and vomiting.       Patient reports bleeding in her depends but is unsure if it was coming from her rectum, vagina, or urine because she is also incontinent after her stroke  Endocrine: Positive for polyuria. Negative for polydipsia.  Genitourinary: Positive for frequency. Negative for difficulty urinating and dysuria.       See GI section of review of systems  Musculoskeletal: Negative for arthralgias, back pain and neck pain.  Neurological: Positive for dizziness (Dizziness upon's standing), speech difficulty (Slightly improving from stroke) and weakness (Positive for weakness but weakness is improving and has been present since stroke). Negative for light-headedness, numbness and headaches.  Psychiatric/Behavioral:       Difficulty finding words     Patient Active Problem List   Diagnosis Date Noted  . Hematuria 08/31/2019  . 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 Stenosis of carotid artery   . Abnormality of aortic valve   . DM (diabetes mellitus), type 2, uncontrolled w/neurologic complication ([place])   . Hypercholesteremia   . CVA (cerebral vascular accident) ([place]) 08/12/2019  . Acute ischemic stroke ([place])   . Hypertension     Past Medical History: Past Medical History:  Diagnosis Date  . Carotid arterial disease ([place]) 07/2019  .  Diabetes ([place])   . HTN (hypertension)   . Hyperlipidemia   . Stroke [facility])    acute/subacute left MCA infarct 08/12/19    Past Surgical History: Past Surgical History:  Procedure Laterality Date  . TONSILLECTOMY    . TRANSCAROTID ARTERY REVASCULARIZATION Left 08/20/2019   Procedure:  LEFT TRANSCAROTID ARTERY REVASCULARIZATION;  Surgeon: Waynetta Sandy, MD;  Location: [facility] OR;  Service: Vascular;  Laterality: Left;    Social History: Social History   Tobacco Use  . Smoking status: Former Smoker    Packs/day: 0.50    Types: Cigarettes    Quit date: 07/28/2019    Years since quitting: 0.0  . Smokeless tobacco: Never Used  Vaping Use  . Vaping Use: Never used  Substance Use Topics  . Alcohol use: Not Currently  . Drug use: Never    Please also refer to relevant sections of EMR.  Family History: Family History  Problem Relation Age of Onset  . Hypertension Mother   . Lung cancer Father      Allergies and Medications: No Known Allergies No current facility-administered medications on file prior to encounter.   Current Outpatient Medications on File Prior to Encounter  Medication Sig Dispense Refill  . amLODipine (NORVASC) 5 MG tablet Take 1 tablet (5 mg total) by mouth daily. 90 tablet 2  . aspirin EC 325 MG EC tablet Take 1 tablet (325 mg total) by mouth daily. 30 tablet 0  . atorvastatin (LIPITOR) 80 MG tablet Take 1 tablet (80 mg total) by mouth daily. 30 tablet 0  . clopidogrel (PLAVIX) 75 MG tablet Take 1 tablet (75 mg total) by mouth daily. 30 tablet 0  . losartan (COZAAR) 50 MG tablet Take 1 tablet (50 mg total) by mouth daily. 30 tablet 0  . metFORMIN (GLUCOPHAGE XR) 500 MG 24 hr tablet Take 1 tablet (500 mg total) by mouth daily with breakfast. 30 tablet 0  . metoprolol tartrate (LOPRESSOR) 25 MG tablet Take 1 tablet (25 mg total) by mouth 2 (two) times daily. 60 tablet 3  . MULTIPLE VITAMIN PO Take 1 tablet by mouth daily.    Marland Kitchen HYDROcodone-acetaminophen (NORCO/VICODIN) 5-325 MG tablet Take 1 tablet by mouth every 4 (four) hours as needed for moderate pain. 6 tablet 0    Objective: BP 136/68   Pulse 93   Temp 98.5 F (36.9 C) (Oral)   Resp 16   Ht 5\' 4"  (1.626 m)   Wt 87.3 kg   SpO2 100%   BMI 33.04 kg/m  Physical  Exam Constitutional:      General: She is not in acute distress.    Appearance: She is ill-appearing. She is not toxic-appearing.     Comments: Pale,  HENT:     Head: Normocephalic and atraumatic.     Nose: No congestion.     Mouth/Throat:     Mouth: Mucous membranes are moist.     Pharynx: No oropharyngeal exudate or posterior oropharyngeal erythema.  Eyes:     Extraocular Movements: Extraocular movements intact.     Conjunctiva/sclera: Conjunctivae normal.     Pupils: Pupils are equal, round, and reactive to light.  Cardiovascular:     Rate and Rhythm: Normal rate and regular rhythm.     Pulses: Normal pulses.     Heart sounds: Normal heart sounds.  Pulmonary:     Effort: Pulmonary effort is normal. No respiratory distress.     Breath sounds: Normal breath sounds. No wheezing.  Abdominal:  General: Bowel sounds are normal. There is no distension.     Palpations: Abdomen is soft. There is no mass.     Tenderness: There is no abdominal tenderness. There is no right CVA tenderness, left CVA tenderness or guarding.     Hernia: No hernia is present.  Musculoskeletal:        General: No swelling, tenderness or deformity. Normal range of motion.     Cervical back: Normal range of motion and neck supple. No rigidity or tenderness.  Skin:    General: Skin is dry.     Coloration: Skin is not jaundiced.     Comments: Pale  Neurological:     Mental Status: She is alert.     Comments: Patient has weakness to right upper and lower extremity which she reports is has improved since her stroke.  Patient with significant difficulty finding words but reports that her speech has also improved since her stroke.  Patient reports she is at [facility].  Reports she knows what city she is in but cannot think of the word.  Reports she knows what year it is but also cannot think of that.  Cannot tell me her own name.      Labs and Imaging: CBC BMET  Recent Labs  Lab 08/31/19 0855  WBC 17.4*   HGB 9.1*  HCT 28.4*  PLT 465*   Recent Labs  Lab 08/31/19 0855  NA 135  K 3.3*  CL 97*  CO2 22  BUN 28*  CREATININE 2.02*  GLUCOSE 157*  CALCIUM 9.1     EKG: sinus tachycardia, PAC's noted.  Repeating due to motion artifact US RENAL  Result Date: 08/31/2019 CLINICAL DATA:  Renal insufficiency EXAM: RENAL / URINARY TRACT ULTRASOUND COMPLETE COMPARISON:  None. FINDINGS: Right Kidney: Renal measurements: 11.7 x 5.4 x 5.9 cm = volume: 198.5 mL . Echogenicity within normal limits. No mass or hydronephrosis visualized. Left Kidney: Renal measurements: 11 4 x 6.1 x 6 cm = volume: 219.6 mL. Echogenicity within normal limits. No mass or hydronephrosis visualized. Bladder: There is heterogeneous echogenicity within the lumen measuring 5.4 x 4.6 x 5 cm. No associated flow on color Doppler. Other: None. IMPRESSION: No hydronephrosis. Heterogeneous echogenicity within the bladder lumen, which may reflect clot or less likely a mass. Electronically Signed   By: Macy Mis M.D.   On: 08/31/2019 11:45   DG Chest Portable 1 View  Result Date: 08/31/2019 CLINICAL DATA:  Chest pain EXAM: PORTABLE CHEST 1 VIEW COMPARISON:  None. FINDINGS: The heart size and mediastinal contours are within normal limits. Both lungs are clear. No pleural effusion or pneumothorax. The visualized skeletal structures are unremarkable. IMPRESSION: No acute process in the chest. Electronically Signed   By: Macy Mis M.D.   On: 08/31/2019 09:12     Gifford Shave, MD 08/31/2019, 11:34 AM PGY-1, [place] Intern pager: [phone], text pages welcome

## 2019-09-01 ENCOUNTER — Encounter (HOSPITAL_COMMUNITY)
Admission: EM | Disposition: A | Discharge status: Home Health | Payer: Self-pay | Source: Home / Self Care | Attending: Family Medicine

## 2019-09-01 ENCOUNTER — Inpatient Hospital Stay (HOSPITAL_COMMUNITY): Payer: Medicare Other | Admitting: Certified Registered Nurse Anesthetist

## 2019-09-01 DIAGNOSIS — E11649 Type 2 diabetes mellitus with hypoglycemia without coma: Secondary | ICD-10-CM | POA: Diagnosis not present

## 2019-09-01 DIAGNOSIS — R310 Gross hematuria: Secondary | ICD-10-CM | POA: Diagnosis present

## 2019-09-01 DIAGNOSIS — N289 Disorder of kidney and ureter, unspecified: Secondary | ICD-10-CM | POA: Diagnosis not present

## 2019-09-01 DIAGNOSIS — D62 Acute posthemorrhagic anemia: Secondary | ICD-10-CM | POA: Diagnosis not present

## 2019-09-01 DIAGNOSIS — E861 Hypovolemia: Secondary | ICD-10-CM | POA: Diagnosis not present

## 2019-09-01 DIAGNOSIS — Z7902 Long term (current) use of antithrombotics/antiplatelets: Secondary | ICD-10-CM | POA: Diagnosis not present

## 2019-09-01 DIAGNOSIS — R531 Weakness: Secondary | ICD-10-CM | POA: Diagnosis not present

## 2019-09-01 DIAGNOSIS — R319 Hematuria, unspecified: Secondary | ICD-10-CM | POA: Diagnosis not present

## 2019-09-01 DIAGNOSIS — N179 Acute kidney failure, unspecified: Secondary | ICD-10-CM | POA: Diagnosis not present

## 2019-09-01 DIAGNOSIS — E7800 Pure hypercholesterolemia, unspecified: Secondary | ICD-10-CM | POA: Diagnosis present

## 2019-09-01 DIAGNOSIS — Z20822 Contact with and (suspected) exposure to covid-19: Secondary | ICD-10-CM | POA: Diagnosis not present

## 2019-09-01 DIAGNOSIS — Z801 Family history of malignant neoplasm of trachea, bronchus and lung: Secondary | ICD-10-CM | POA: Diagnosis not present

## 2019-09-01 DIAGNOSIS — Z7982 Long term (current) use of aspirin: Secondary | ICD-10-CM | POA: Diagnosis not present

## 2019-09-01 DIAGNOSIS — Z7984 Long term (current) use of oral hypoglycemic drugs: Secondary | ICD-10-CM | POA: Diagnosis not present

## 2019-09-01 DIAGNOSIS — D494 Neoplasm of unspecified behavior of bladder: Secondary | ICD-10-CM | POA: Diagnosis not present

## 2019-09-01 DIAGNOSIS — Z87891 Personal history of nicotine dependence: Secondary | ICD-10-CM | POA: Diagnosis not present

## 2019-09-01 DIAGNOSIS — Z79899 Other long term (current) drug therapy: Secondary | ICD-10-CM | POA: Diagnosis not present

## 2019-09-01 DIAGNOSIS — R32 Unspecified urinary incontinence: Secondary | ICD-10-CM | POA: Diagnosis present

## 2019-09-01 DIAGNOSIS — I10 Essential (primary) hypertension: Secondary | ICD-10-CM | POA: Diagnosis not present

## 2019-09-01 DIAGNOSIS — C679 Malignant neoplasm of bladder, unspecified: Secondary | ICD-10-CM | POA: Diagnosis not present

## 2019-09-01 DIAGNOSIS — E785 Hyperlipidemia, unspecified: Secondary | ICD-10-CM | POA: Diagnosis present

## 2019-09-01 DIAGNOSIS — E119 Type 2 diabetes mellitus without complications: Secondary | ICD-10-CM | POA: Diagnosis not present

## 2019-09-01 DIAGNOSIS — Z8673 Personal history of transient ischemic attack (TIA), and cerebral infarction without residual deficits: Secondary | ICD-10-CM | POA: Diagnosis not present

## 2019-09-01 DIAGNOSIS — I69320 Aphasia following cerebral infarction: Secondary | ICD-10-CM | POA: Diagnosis not present

## 2019-09-01 HISTORY — PX: CYSTOSCOPY WITH FULGERATION: SHX6638

## 2019-09-01 LAB — TYPE AND SCREEN
ABO/RH(D): O POS
Antibody Screen: NEGATIVE
Unit division: 0

## 2019-09-01 LAB — BASIC METABOLIC PANEL
Anion gap: 10 (ref 5–15)
BUN: 24 mg/dL — ABNORMAL HIGH (ref 8–23)
CO2: 25 mmol/L (ref 22–32)
Calcium: 8.7 mg/dL — ABNORMAL LOW (ref 8.9–10.3)
Chloride: 101 mmol/L (ref 98–111)
Creatinine, Ser: 1.17 mg/dL — ABNORMAL HIGH (ref 0.44–1.00)
GFR calc Af Amer: 56 mL/min — ABNORMAL LOW (ref >60)
GFR calc non Af Amer: 49 mL/min — ABNORMAL LOW (ref >60)
Glucose, Bld: 129 mg/dL — ABNORMAL HIGH (ref 70–99)
Potassium: 3.3 mmol/L — ABNORMAL LOW (ref 3.5–5.1)
Sodium: 136 mmol/L (ref 135–145)

## 2019-09-01 LAB — CBC
HCT: 26.3 % — ABNORMAL LOW (ref 36.0–46.0)
HCT: 27 % — ABNORMAL LOW (ref 36.0–46.0)
Hemoglobin: 8.8 g/dL — ABNORMAL LOW (ref 12.0–15.0)
Hemoglobin: 8.9 g/dL — ABNORMAL LOW (ref 12.0–15.0)
MCH: 29.7 pg (ref 26.0–34.0)
MCH: 29.2 pg (ref 26.0–34.0)
MCHC: 33.5 g/dL (ref 30.0–36.0)
MCHC: 33 g/dL (ref 30.0–36.0)
MCV: 88.9 fL (ref 80.0–100.0)
MCV: 88.5 fL (ref 80.0–100.0)
Platelets: 367 10*3/uL (ref 150–400)
Platelets: 392 10*3/uL (ref 150–400)
RBC: 2.96 MIL/uL — ABNORMAL LOW (ref 3.87–5.11)
RBC: 3.05 MIL/uL — ABNORMAL LOW (ref 3.87–5.11)
RDW: 13.7 % (ref 11.5–15.5)
RDW: 13.6 % (ref 11.5–15.5)
WBC: 11.9 10*3/uL — ABNORMAL HIGH (ref 4.0–10.5)
WBC: 14.1 10*3/uL — ABNORMAL HIGH (ref 4.0–10.5)
nRBC: 0 % (ref 0.0–0.2)
nRBC: 0 % (ref 0.0–0.2)

## 2019-09-01 LAB — BPAM RBC
Blood Product Expiration Date: 202108162359
ISSUE DATE / TIME: 202107192309
Unit Type and Rh: 5100

## 2019-09-01 LAB — POCT I-STAT, CHEM 8
BUN: 17 mg/dL (ref 8–23)
Calcium, Ion: 1.24 mmol/L (ref 1.15–1.40)
Chloride: 104 mmol/L (ref 98–111)
Creatinine, Ser: 0.7 mg/dL (ref 0.44–1.00)
Glucose, Bld: 157 mg/dL — ABNORMAL HIGH (ref 70–99)
HCT: 27 % — ABNORMAL LOW (ref 36.0–46.0)
Hemoglobin: 9.2 g/dL — ABNORMAL LOW (ref 12.0–15.0)
Potassium: 4.1 mmol/L (ref 3.5–5.1)
Sodium: 138 mmol/L (ref 135–145)
TCO2: 21 mmol/L — ABNORMAL LOW (ref 22–32)

## 2019-09-01 LAB — GLUCOSE, CAPILLARY
Glucose-Capillary: 162 mg/dL — ABNORMAL HIGH (ref 70–99)
Glucose-Capillary: 140 mg/dL — ABNORMAL HIGH (ref 70–99)
Glucose-Capillary: 120 mg/dL — ABNORMAL HIGH (ref 70–99)
Glucose-Capillary: 109 mg/dL — ABNORMAL HIGH (ref 70–99)

## 2019-09-01 SURGERY — CYSTOSCOPY, WITH BLADDER FULGURATION
Anesthesia: General | Site: Bladder

## 2019-09-01 MED ORDER — ASPIRIN 325 MG PO TABS: 325.0000 mg | ORAL_TABLET | Freq: Every day | ORAL | Status: DC

## 2019-09-01 MED ORDER — FENTANYL CITRATE (PF) 250 MCG/5ML IJ SOLN
INTRAMUSCULAR | Status: AC
  Filled 2019-09-01: qty 5

## 2019-09-01 MED ORDER — CHLORHEXIDINE GLUCONATE 0.12 % MT SOLN: 15.0000 mL | Freq: Once | OROMUCOSAL | Status: AC

## 2019-09-01 MED ORDER — WHITE PETROLATUM EX OINT
TOPICAL_OINTMENT | CUTANEOUS | Status: DC | PRN
  Filled 2019-09-01: qty 28.35

## 2019-09-01 MED ORDER — BISACODYL 10 MG RE SUPP
10.0000 mg | Freq: Every day | RECTAL | Status: DC
  Administered 2019-09-03: 10 mg via RECTAL
  Filled 2019-09-01 (×6): qty 1

## 2019-09-01 MED ORDER — PHENYLEPHRINE HCL (PRESSORS) 10 MG/ML IV SOLN
INTRAVENOUS | Status: DC | PRN
  Administered 2019-09-01: 80 ug via INTRAVENOUS
  Administered 2019-09-01: 40 ug via INTRAVENOUS
  Administered 2019-09-01: 80 ug via INTRAVENOUS

## 2019-09-01 MED ORDER — EPHEDRINE SULFATE 50 MG/ML IJ SOLN
INTRAMUSCULAR | Status: DC | PRN
  Administered 2019-09-01 (×2): 10 mg via INTRAVENOUS
  Administered 2019-09-01: 5 mg via INTRAVENOUS

## 2019-09-01 MED ORDER — BELLADONNA ALKALOIDS-OPIUM 16.2-60 MG RE SUPP
1.0000 | Freq: Once | RECTAL | Status: DC
  Filled 2019-09-01: qty 1

## 2019-09-01 MED ORDER — ACETAMINOPHEN 500 MG PO TABS: 1000.0000 mg | ORAL_TABLET | Freq: Once | ORAL | Status: DC

## 2019-09-01 MED ORDER — ASPIRIN 325 MG PO TABS
325.0000 mg | ORAL_TABLET | Freq: Every day | ORAL | Status: DC
  Administered 2019-09-01 – 2019-09-07 (×7): 325 mg via ORAL
  Filled 2019-09-01 (×7): qty 1

## 2019-09-01 MED ORDER — SODIUM CHLORIDE 0.9 % IR SOLN
3000.0000 mL | Status: DC
  Administered 2019-09-01: 3000 mL

## 2019-09-01 MED ORDER — STERILE WATER FOR IRRIGATION IR SOLN
Status: DC | PRN
  Administered 2019-09-01: 3000 mL

## 2019-09-01 MED ORDER — LIDOCAINE HCL URETHRAL/MUCOSAL 2 % EX GEL
CUTANEOUS | Status: AC
  Filled 2019-09-01: qty 11

## 2019-09-01 MED ORDER — AMISULPRIDE (ANTIEMETIC) 5 MG/2ML IV SOLN: 10.0000 mg | Freq: Once | INTRAVENOUS | Status: DC | PRN

## 2019-09-01 MED ORDER — CHLORHEXIDINE GLUCONATE 0.12 % MT SOLN
OROMUCOSAL | Status: AC
  Administered 2019-09-01: 15 mL via OROMUCOSAL
  Filled 2019-09-01: qty 15

## 2019-09-01 MED ORDER — SODIUM CHLORIDE 0.9 % IR SOLN
Status: DC | PRN
  Administered 2019-09-01: 3000 mL via INTRAVESICAL
  Administered 2019-09-01: 24000 mL via INTRAVESICAL
  Administered 2019-09-01: 51000 mL via INTRAVESICAL
  Administered 2019-09-01: 3000 mL via INTRAVESICAL

## 2019-09-01 MED ORDER — LACTATED RINGERS IV SOLN: INTRAVENOUS | Status: DC

## 2019-09-01 MED ORDER — LIDOCAINE HCL (CARDIAC) PF 100 MG/5ML IV SOSY
PREFILLED_SYRINGE | INTRAVENOUS | Status: DC | PRN
  Administered 2019-09-01: 40 mg via INTRAVENOUS

## 2019-09-01 MED ORDER — POTASSIUM CHLORIDE 10 MEQ/100ML IV SOLN
10.0000 meq | INTRAVENOUS | Status: AC
  Administered 2019-09-01 (×4): 10 meq via INTRAVENOUS
  Filled 2019-09-01 (×4): qty 100

## 2019-09-01 MED ORDER — CEFAZOLIN SODIUM-DEXTROSE 2-3 GM-%(50ML) IV SOLR
INTRAVENOUS | Status: DC | PRN
Start: 2019-09-01 — End: 2019-09-01
  Administered 2019-09-01: 2 g via INTRAVENOUS

## 2019-09-01 MED ORDER — PROPOFOL 10 MG/ML IV BOLUS
INTRAVENOUS | Status: AC
  Filled 2019-09-01: qty 20

## 2019-09-01 MED ORDER — 0.9 % SODIUM CHLORIDE (POUR BTL) OPTIME
TOPICAL | Status: DC | PRN
  Administered 2019-09-01 (×2): 1000 mL

## 2019-09-01 MED ORDER — FENTANYL CITRATE (PF) 100 MCG/2ML IJ SOLN: 25.0000 ug | INTRAMUSCULAR | Status: DC | PRN

## 2019-09-01 MED ORDER — DEXAMETHASONE SODIUM PHOSPHATE 4 MG/ML IJ SOLN
INTRAMUSCULAR | Status: DC | PRN
  Administered 2019-09-01: 5 mg via INTRAVENOUS

## 2019-09-01 MED ORDER — PHENYLEPHRINE HCL-NACL 10-0.9 MG/250ML-% IV SOLN
INTRAVENOUS | Status: DC | PRN
  Administered 2019-09-01: 25 ug/min via INTRAVENOUS

## 2019-09-01 MED ORDER — PROPOFOL 10 MG/ML IV BOLUS
INTRAVENOUS | Status: DC | PRN
  Administered 2019-09-01: 150 mg via INTRAVENOUS
  Administered 2019-09-01: 50 mg via INTRAVENOUS

## 2019-09-01 MED ORDER — FENTANYL CITRATE (PF) 100 MCG/2ML IJ SOLN
INTRAMUSCULAR | Status: DC | PRN
  Administered 2019-09-01: 25 ug via INTRAVENOUS
  Administered 2019-09-01 (×2): 50 ug via INTRAVENOUS
  Administered 2019-09-01: 25 ug via INTRAVENOUS
  Administered 2019-09-01: 50 ug via INTRAVENOUS
  Administered 2019-09-01 (×2): 25 ug via INTRAVENOUS
  Administered 2019-09-01: 50 ug via INTRAVENOUS

## 2019-09-01 SURGICAL SUPPLY — 25 items
BAG URINE DRAIN 2000ML AR STRL (UROLOGICAL SUPPLIES) ×2 IMPLANT
BAG URO CATCHER STRL LF (MISCELLANEOUS) ×2 IMPLANT
CATH FOLEY 3WAY 30CC 22F (CATHETERS) ×1 IMPLANT
CATH URET 5FR 28IN OPEN ENDED (CATHETERS) ×1 IMPLANT
EVACUATOR MICROVAS BLADDER (UROLOGICAL SUPPLIES) ×1 IMPLANT
GLOVE BIO SURGEON STRL SZ 6.5 (GLOVE) ×2 IMPLANT
GOWN STRL REUS W/ TWL LRG LVL3 (GOWN DISPOSABLE) ×1 IMPLANT
GOWN STRL REUS W/ TWL XL LVL3 (GOWN DISPOSABLE) ×1 IMPLANT
GOWN STRL REUS W/TWL LRG LVL3 (GOWN DISPOSABLE) ×1
GOWN STRL REUS W/TWL XL LVL3 (GOWN DISPOSABLE) ×1
GUIDEWIRE ANG ZIPWIRE 038X150 (WIRE) IMPLANT
GUIDEWIRE STR DUAL SENSOR (WIRE) ×1 IMPLANT
IV NS IRRIG 3000ML ARTHROMATIC (IV SOLUTION) ×2 IMPLANT
KIT TURNOVER KIT B (KITS) ×2 IMPLANT
LOOP MONOPOLAR YLW (ELECTROSURGICAL) ×1 IMPLANT
MANIFOLD NEPTUNE II (INSTRUMENTS) IMPLANT
NS IRRIG 1000ML POUR BTL (IV SOLUTION) IMPLANT
PACK CYSTO (CUSTOM PROCEDURE TRAY) ×2 IMPLANT
STENT URET 6FRX24 CONTOUR (STENTS) IMPLANT
STENT URET 6FRX26 CONTOUR (STENTS) IMPLANT
SYPHON OMNI JUG (MISCELLANEOUS) ×2 IMPLANT
SYR CONTROL 10ML LL (SYRINGE) ×1 IMPLANT
TOWEL GREEN STERILE FF (TOWEL DISPOSABLE) ×2 IMPLANT
TRAY IRRIG W/60CC SYR STRL (SET/KITS/TRAYS/PACK) ×2 IMPLANT
TUBE CONNECTING 12X1/4 (SUCTIONS) IMPLANT

## 2019-09-01 NOTE — Anesthesia Preprocedure Evaluation (Signed)
Anesthesia Evaluation  Patient identified by MRN, date of birth, ID band Patient awake    Reviewed: Allergy & Precautions, NPO status , Patient's Chart, lab work & pertinent test results  Airway Mallampati: II  TM Distance: >3 FB Neck ROM: Full    Dental  (+) Dental Advisory Given   Pulmonary former smoker,    breath sounds clear to auscultation       Cardiovascular hypertension, Pt. on medications and Pt. on home beta blockers  Rhythm:Regular Rate:Normal     Neuro/Psych CVA (s/p carotid artery stenting)    GI/Hepatic negative GI ROS, Neg liver ROS,   Endo/Other  diabetes, Type 2, Oral Hypoglycemic Agents  Renal/GU negative Renal ROS   hematuria    Musculoskeletal   Abdominal   Peds  Hematology  (+) anemia ,   Anesthesia Other Findings   Reproductive/Obstetrics                             Anesthesia Physical Anesthesia Plan  ASA: III  Anesthesia Plan: General   Post-op Pain Management:    Induction: Intravenous  PONV Risk Score and Plan: 3 and Dexamethasone, Ondansetron and Treatment may vary due to age or medical condition  Airway Management Planned: Oral ETT and LMA  Additional Equipment: None  Intra-op Plan:   Post-operative Plan: Extubation in OR  Informed Consent: I have reviewed the patients History and Physical, chart, labs and discussed the procedure including the risks, benefits and alternatives for the proposed anesthesia with the patient or authorized representative who has indicated his/her understanding and acceptance.     Dental advisory given  Plan Discussed with: CRNA  Anesthesia Plan Comments:         Anesthesia Quick Evaluation

## 2019-09-01 NOTE — Anesthesia Procedure Notes (Signed)
Procedure Name: LMA Insertion Date/Time: 09/01/2019 4:34 PM Performed by: Oletta Lamas, CRNA Pre-anesthesia Checklist: Patient identified, Emergency Drugs available, Suction available and Patient being monitored Patient Re-evaluated:Patient Re-evaluated prior to induction Oxygen Delivery Method: Circle System Utilized Preoxygenation: Pre-oxygenation with 100% oxygen Induction Type: IV induction Ventilation: Mask ventilation without difficulty LMA: LMA inserted LMA Size: 4.0 Number of attempts: 1 Placement Confirmation: positive ETCO2 Tube secured with: Tape Dental Injury: Teeth and Oropharynx as per pre-operative assessment

## 2019-09-01 NOTE — Progress Notes (Signed)
Patient's hemoglobin continues to trend down last checked at 7.9 and required blood transfusion.  She responded appropriately to 1 unit packed red blood cells.  Please keep n.p.o. and will post for cystoscopy with clot evacuation and possible fulguration of bladder this afternoon.

## 2019-09-01 NOTE — Evaluation (Signed)
Occupational Therapy Evaluation Patient Details Name: Lisa [name] MRN: [id number] DOB: Sep 22, 1950 Today's Date: 09/01/2019    History of Present Illness Pt is a 69 y/o female admitted secondary to increased abdominal discomfort. Found to have hematuria; workup pending. Pt with recent CVA. PMH includes CVA, HTN and DM.    Clinical Impression   PTA, pt reports Independence with ADLs, IADLs and mobility though unsure of accuracy as no family present during OT eval. Pt with deficits in R UE strength, cognition (sequencing, motor planning difficulties), impaired safety, and decreased dynamic standing balance. Pt requires Supervision to Min A for ADLs assessed and overall min guard for short distance mobility today. Pt reports her sister in laws in [place] can provide assistance PRN and they are working to get an aide to assist at home. If 24/7 assist unable to be provided, recommend SNF for short term rehab.    Follow Up Recommendations  Home health OT;Supervision/Assistance - 24 hour;Other (comment) (if 24/7 assist not available, recommend SNF)    Equipment Recommendations  None recommended by OT    Recommendations for Other Services       Precautions / Restrictions Precautions Precautions: Fall Restrictions Weight Bearing Restrictions: No      Mobility Bed Mobility Overal bed mobility: Needs Assistance Bed Mobility: Supine to Sit;Sit to Supine     Supine to sit: Supervision Sit to supine: Supervision   General bed mobility comments: Supervision for safety to ensure safety with lines as pt with decreased attention to IV line and catheter bag  Transfers Overall transfer level: Needs assistance Equipment used: None Transfers: Sit to/from Stand;Stand Pivot Transfers Sit to Stand: Supervision Stand pivot transfers: Min guard       General transfer comment: Supervision for sit to stand, no physical assist needed. Pt min guard for stand pivot and mobiility to maintain  safety/steadiness    Balance Overall balance assessment: Needs assistance Sitting-balance support: No upper extremity supported;Feet supported Sitting balance-Leahy Scale: Good     Standing balance support: No upper extremity supported;Single extremity supported Standing balance-Leahy Scale: Fair Standing balance comment: Able to maintain static standing.                            ADL either performed or assessed with clinical judgement   ADL Overall ADL's : Needs assistance/impaired Eating/Feeding: Set up;Sitting   Grooming: Minimal assistance;Standing;Wash/dry face;Oral care;Brushing hair Grooming Details (indicate cue type and reason): Pt Min A for oral care standing at sink with difficulty problem solving and sequencing task without cues. Pt able to brush hair and wash face with minimal cues Upper Body Bathing: Supervision/ safety;Sitting   Lower Body Bathing: Min guard;Sit to/from stand   Upper Body Dressing : Supervision/safety;Sitting   Lower Body Dressing: Min guard;Sit to/from stand Lower Body Dressing Details (indicate cue type and reason): Pt supervision for doffing/donning socks sitting EOB with increased time Toilet Transfer: Min guard;Ambulation;BSC;Cueing for safety;Cueing for sequencing Toilet Transfer Details (indicate cue type and reason): min guard for mobility to sink to ensure steadiness and safety Toileting- Clothing Manipulation and Hygiene: Min guard;Cueing for safety;Cueing for sequencing;Sitting/lateral lean       Functional mobility during ADLs: Min guard;Cueing for safety;Cueing for sequencing General ADL Comments: Pt requires min guard to min A for ADls in standing due to unsteadiness, decreased safety, cognitive deficits, and decreased strength.     Vision Baseline Vision/History: Wears glasses Wears Glasses: At all times Patient Visual Report:  No change from baseline Vision Assessment?: No apparent visual deficits     Perception      Praxis Praxis Praxis tested?: Deficits Deficits: Organization Praxis-Other Comments: Pt with difficulty sequencing tasks, also with motor planning deficits.     Pertinent Vitals/Pain Pain Assessment: No/denies pain Faces Pain Scale: No hurt     Hand Dominance Left   Extremity/Trunk Assessment Upper Extremity Assessment Upper Extremity Assessment: Generalized weakness;RUE deficits/detail RUE Deficits / Details: 3-/5 R UE compared to 4-/5 for L UE RUE Sensation: WNL RUE Coordination: decreased fine motor   Lower Extremity Assessment Lower Extremity Assessment: Defer to PT evaluation   Cervical / Trunk Assessment Cervical / Trunk Assessment: Normal   Communication Communication Communication: Expressive difficulties   Cognition Arousal/Alertness: Awake/alert Behavior During Therapy: WFL for tasks assessed/performed Overall Cognitive Status: No family/caregiver present to determine baseline cognitive functioning Area of Impairment: Orientation;Attention;Memory;Following commands;Safety/judgement;Awareness;Problem solving                 Orientation Level: Situation Current Attention Level: Sustained Memory: Decreased short-term memory Following Commands: Follows one step commands with increased time Safety/Judgement: Decreased awareness of safety;Decreased awareness of deficits Awareness: Emergent Problem Solving: Difficulty sequencing;Requires verbal cues;Requires tactile cues;Slow processing General Comments: With increased time, pt able to confirm time and place, difficulty getting name/birthday out. Pt with improved responses to yes/no questions, though answer varied throughout session (such as right handed vs left handed)   General Comments  Pt with improved responses to yes/no questions during session, though varied answers at times    Exercises     Shoulder Instructions      Home Living Family/patient expects to be discharged to:: Private  residence Living Arrangements: Spouse/significant other Available Help at Discharge: Family;Available PRN/intermittently (sister in laws) Type of Home: House Home Access: Stairs to enter [employer] of Steps: 3 Entrance Stairs-Rails: None Home Layout: Two level Alternate Level Stairs-Number of Steps: flight Alternate Level Stairs-Rails: Right Bathroom Shower/Tub: Occupational psychologist: Standard     Home Equipment: Shower seat;Grab bars - tub/shower;Grab bars - toilet   Additional Comments: Pt able to confirm previous home setup info. Pt reports husband does not live with her anymore - unsure of accuracy of family dynamics  Lives With: Spouse    Prior Functioning/Environment Level of Independence: Independent        Comments: Pt reports Independence with ADLs, IADLs (cooking, laundry), and mobility without AD. Unsure of accuracy due to cognitive deficits from recent CVA        OT Problem List: Decreased strength;Impaired balance (sitting and/or standing);Impaired vision/perception;Decreased cognition;Decreased safety awareness;Decreased knowledge of use of DME or AE;Decreased knowledge of precautions      OT Treatment/Interventions: Self-care/ADL training;Neuromuscular education;Therapeutic activities;Cognitive remediation/compensation;Visual/perceptual remediation/compensation;Patient/family education;Balance training;DME and/or AE instruction    OT Goals(Current goals can be found in the care plan section) Acute Rehab OT Goals Patient Stated Goal: be like I was before OT Goal Formulation: With patient Time For Goal Achievement: 09/15/19 Potential to Achieve Goals: Good ADL Goals Pt Will Perform Grooming: with set-up;standing Pt Will Perform Lower Body Bathing: with set-up;sit to/from stand Pt Will Perform Lower Body Dressing: with set-up;sit to/from stand Pt Will Transfer to Toilet: with supervision;ambulating;regular height toilet;grab bars Pt Will  Perform Toileting - Clothing Manipulation and hygiene: sit to/from stand;with set-up Additional ADL Goal #1: Pt to demonstrate ability to sequence ADLs correctly with <25% verbal cues in order to maximize independence  OT Frequency: Min 2X/week   Barriers to D/C:  Co-evaluation              AM-PAC OT "6 Clicks" Daily Activity     Outcome Measure Help from another person eating meals?: A Little Help from another person taking care of personal grooming?: A Little Help from another person toileting, which includes using toliet, bedpan, or urinal?: A Little Help from another person bathing (including washing, rinsing, drying)?: A Little Help from another person to put on and taking off regular upper body clothing?: A Little Help from another person to put on and taking off regular lower body clothing?: A Little 6 Click Score: 18   End of Session Equipment Utilized During Treatment: Gait belt Nurse Communication: Mobility status;Other (comment) (IV)  Activity Tolerance: Patient tolerated treatment well Patient left: in bed;with call bell/phone within reach;with bed alarm set  OT Visit Diagnosis: Unsteadiness on feet (R26.81);Muscle weakness (generalized) (M62.81);Low vision, both eyes (H54.2);Other symptoms and signs involving the nervous system (R29.898);Other symptoms and signs involving cognitive function                Time: 1216-2446 OT Time Calculation (min): 24 min Charges:  OT General Charges $OT Visit: 1 Visit OT Evaluation $OT Eval Moderate Complexity: 1 Mod OT Treatments $Self Care/Home Management : 8-22 mins  Layla Maw, OTR/L  Layla Maw 09/01/2019, 9:13 AM

## 2019-09-01 NOTE — Progress Notes (Addendum)
Report called to [facility] in short stay.   Patient off floor to OR.

## 2019-09-01 NOTE — Progress Notes (Signed)
   I interviewed and examined the patient at bedside.  She appears stable from recent carotid artery stenting.  Okay to hold Plavix as necessary with noted plan for possible cystoscopy today.  Will need to be back on Plavix when okay from a urology standpoint.  Boots Mcglown C. Donzetta Matters, MD [facility] Office: [phone] Pager: [phone]

## 2019-09-01 NOTE — Op Note (Signed)
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 TRANSURETHRAL RESECTION OF BLADDER TUMOR (TURBT)  2.  Cystoscopy with clot evacuation  SURGEON: Jacalyn Lefevre, MD  ANESTHESIA:   General  EBL:  less than 100 mL  DRAINS: Urethral catheter (30 French three-way three-way catheter with continuous bladder irrigation)   SPECIMEN:  Bladder tumor  DISPOSITION OF SPECIMEN:  PATHOLOGY  Indication: 69 year old woman with a history of recent CVA on aspirin and Plavix with 2 weeks of gross hematuria found to have significant clot burden on renal ultrasound and acute blood loss anemia.  Description of operation:  After the risks and benefits of the procedure were discussed with the patient and her husband informed consent was obtained.  The patient was taken to the operating room and administered general anesthesia. They were then placed on the table and moved to the dorsal lithotomy position after which the genitalia was sterilely prepped and draped. An official timeout was then performed.  The 53 French resectoscope with the 30 lens and visual obturator were then passed into the bladder under direct visualization. Urethra appeared normal. The visual obturator was then removed and the Gyrus resectoscope element with 30  lens was then inserted and the bladder was fully and systematically inspected. Ureteral orifices were noted to be in the normal anatomic positions.   At this point in time a large clot was seen in the bladder.  Papillary bladder mass was also noted scattered throughout the bladder most prominent at the anterior bladder neck, right posterior bladder wall, and free floating in the clot.  I first began by resecting using the Toomey syringe to evacuate the clot.  The clot was significantly adherent to tumor and did not break up easily.  A  combination of bipolar electrocautery using the loop and Toomey syringe broke up the clot.  Visualization was poor throughout the case and it took considerable time to remove the clot and portions of bladder tumor.  The Daylene Posey was then used to irrigate the bladder and remove all of the portions of bladder tumor which were sent to pathology. I then removed the resectoscope.  A 22 French Foley catheter was then inserted in the bladder and and continuous bladder irrigation was initiated.  The patient was awakened and taken to the recovery room.   PLAN OF CARE: Transfer back to the floor  PATIENT DISPOSITION:  PACU - hemodynamically stable.

## 2019-09-01 NOTE — Interval H&P Note (Signed)
History and Physical Interval Note: Patient continues to have drop in Hgb and very dark urine.  Plan for below procedure.  Phone consent obtained from husband.   09/01/2019 4:16 PM  Lisa [name]  has presented today for surgery, with the diagnosis of clot.  The various methods of treatment have been discussed with the patient and family. After consideration of risks, benefits and other options for treatment, the patient has consented to  Procedure(s): [place] (N/A) as a surgical intervention.  The patient's history has been reviewed, patient examined, no change in status, stable for surgery.  I have reviewed the patient's chart and labs.  Questions were answered to the patient's satisfaction.     Nikiesha Milford D Treyshawn Muldrew

## 2019-09-01 NOTE — H&P (View-Only) (Signed)
Patient's hemoglobin continues to trend down last checked at 7.9 and required blood transfusion.  She responded appropriately to 1 unit packed red blood cells.  Please keep n.p.o. and will post for cystoscopy with clot evacuation and possible fulguration of bladder this afternoon.

## 2019-09-01 NOTE — Progress Notes (Addendum)
Spoke with Dr. Claudia Desanctis, Urology, states that patient has bladder tumor. Will require continuous bladder irrigation for now. Intra-operative Hgb 8.9. Recommends holding Plavix today and tomorrow, but can continue on ASA.  Will likely require restaging of bladder tumor in the future. Urology still on board, will see patient tomorrow. Will follow up on path results.

## 2019-09-01 NOTE — Progress Notes (Signed)
Physical Therapy Treatment Patient Details Name: Lisa [name] MRN: [id number] DOB: 1950/04/10 Today's Date: 09/01/2019    History of Present Illness Pt is a 69 y/o female admitted secondary to increased abdominal discomfort. Found to have hematuria; workup pending. Pt with recent CVA. PMH includes CVA, HTN and DM.     PT Comments    Pt with progressed ambulation distance this day, but requires occasional steadying assist and cuing for safe hallway navigation. Pt remains confused, unable to state name or birthday and has difficulty with safety awareness. PT continuing to recommend HHPT with 24/7, vs SNF if 24/7 assist is not available.    Follow Up Recommendations  Home health PT;Supervision/Assistance - 24 hour (if pt does not have 24/7, will need SNF )     Equipment Recommendations  None recommended by PT    Recommendations for Other Services       Precautions / Restrictions Precautions Precautions: Fall Restrictions Weight Bearing Restrictions: No    Mobility  Bed Mobility Overal bed mobility: Needs Assistance Bed Mobility: Supine to Sit;Sit to Supine     Supine to sit: Supervision Sit to supine: Supervision   General bed mobility comments: for safety, lines/leads management.  Transfers Overall transfer level: Needs assistance Equipment used: None;1 person hand held assist Transfers: Sit to/from Stand Sit to Stand: Min guard Stand pivot transfers: Min guard       General transfer comment: for safety, pt reaching for HHA to self-steady.  Ambulation/Gait Ambulation/Gait assistance: Min guard;Min assist Gait Distance (Feet): 150 Feet Assistive device: 1 person hand held assist;IV Pole;None Gait Pattern/deviations: Step-through pattern;Decreased stride length;Staggering right Gait velocity: decr   General Gait Details: min guard for safety, occasional min assist to steady and correct pt trajectory away from hallway objects as pt drifts R repeatedly. No  overt LOB, mild unsteadiness noted.   Stairs             Wheelchair Mobility    Modified Rankin (Stroke Patients Only)       Balance Overall balance assessment: Needs assistance Sitting-balance support: No upper extremity supported;Feet supported Sitting balance-Leahy Scale: Good     Standing balance support: No upper extremity supported;Single extremity supported Standing balance-Leahy Scale: Fair Standing balance comment: Able to maintain static standing, requires occasional SL support dynamically             High level balance activites: Head turns;Turns High Level Balance Comments: significant deviation to walking path with horizontal head turns, increased time for directional changes            Cognition Arousal/Alertness: Awake/alert Behavior During Therapy: WFL for tasks assessed/performed Overall Cognitive Status: No family/caregiver present to determine baseline cognitive functioning Area of Impairment: Orientation;Attention;Memory;Following commands;Safety/judgement;Awareness;Problem solving                 Orientation Level: Situation;Disoriented to;Person;Time Current Attention Level: Sustained Memory: Decreased short-term memory Following Commands: Follows one step commands with increased time Safety/Judgement: Decreased awareness of safety;Decreased awareness of deficits Awareness: Emergent Problem Solving: Requires verbal cues;Requires tactile cues;Slow processing General Comments: Pt states "I don't know" when asked for name, but able to state "yes" when asked if her name was Tonisha. Pt states the year is "2015, then 2025". Pt follows commands with increased time, lacks safety awareness during hallway navigation.      Exercises      General Comments General comments (skin integrity, edema, etc.): Pt with improved responses to yes/no questions during session, though varied answers at times  Pertinent Vitals/Pain Pain  Assessment: No/denies pain Faces Pain Scale: No hurt    Home Living Family/patient expects to be discharged to:: Private residence Living Arrangements: Spouse/significant other Available Help at Discharge: Family;Available PRN/intermittently (sister in laws) Type of Home: House Home Access: Stairs to enter Entrance Stairs-Rails: None Home Layout: Two level Home Equipment: Shower seat;Grab bars - tub/shower;Grab bars - toilet Additional Comments: Pt able to confirm previous home setup info. Pt reports husband does not live with her anymore - unsure of accuracy of family dynamics    Prior Function Level of Independence: Independent      Comments: Pt reports Independence with ADLs, IADLs (cooking, laundry), and mobility without AD. Unsure of accuracy due to cognitive deficits from recent CVA   PT Goals (current goals can now be found in the care plan section) Acute Rehab PT Goals Patient Stated Goal: to go home PT Goal Formulation: With patient Time For Goal Achievement: 09/14/19 Potential to Achieve Goals: Good Progress towards PT goals: Progressing toward goals    Frequency    Min 3X/week      PT Plan Current plan remains appropriate    Co-evaluation              AM-PAC PT "6 Clicks" Mobility   Outcome Measure  Help needed turning from your back to your side while in a flat bed without using bedrails?: A Little Help needed moving from lying on your back to sitting on the side of a flat bed without using bedrails?: A Little Help needed moving to and from a bed to a chair (including a wheelchair)?: A Little Help needed standing up from a chair using your arms (e.g., wheelchair or bedside chair)?: A Little Help needed to walk in hospital room?: A Little Help needed climbing 3-5 steps with a railing? : A Lot 6 Click Score: 17    End of Session Equipment Utilized During Treatment: Gait belt Activity Tolerance: Patient tolerated treatment well Patient left: in  bed;with call bell/phone within reach;with bed alarm set Nurse Communication: Mobility status PT Visit Diagnosis: Unsteadiness on feet (R26.81);Difficulty in walking, not elsewhere classified (R26.2)     Time: 9528-4132 PT Time Calculation (min) (ACUTE ONLY): 15 min  Charges:  $Gait Training: 8-22 mins                     Alexa E, PT [place] Pager [phone]  Office [phone]    Alexa D Eure 09/01/2019, 11:03 AM

## 2019-09-01 NOTE — OR Nursing (Signed)
Assumed care of patient at 1912. 

## 2019-09-01 NOTE — Progress Notes (Signed)
Family Medicine Teaching Service Daily Progress Note Intern Pager: [phone]  Patient name: Lisa [name] Medical record number: [id number] Date of birth: 01-19-1951 Age: 69 y.o. Gender: female  Primary Care Provider: Patient, No Pcp Per Consultants: Urology Code Status: Full  Pt Overview and Major Events to Date:  Admitted 7/19  Assessment and Plan: Ms. Delange is a 69 year old female presenting with upper abdominal discomfort, found to be anemic with gross hematuria.  PMH significant for recent ischemic CVA, recent carotid stent placement, hypertension, hyperlipidemia, DM type II.  Anemia 2/2 hematuria Hemoglobin 9.1 > 7.9 >1 unit blood transfusion >8.8 this morning.  Reported hematuria since last hospitalization.  Vitals overnight, BP systolic 412-878, diastolic 67-672, pulse 09-47.  SPO2 95-100%.  UA unable to interpret due to color interference of urine pigment.  Renal ultrasound 7/19 showed heterogenous echogenicity within the bladder lumen (clot vs mass), no hydronephrosis.  -Hold home Plavix 75 but continue aspirin 325 per vascular surgery -Follow-up 2pm scheduled afternoon CBC -SCDs -Urology on board, potential intervention today -Monitor repeat CBC, BMP -Continuous cardiac monitoring, pulse ox -Strict I's and O's -Consider urine culture -Consider repeat UA  AKI Creatinine 2.02 > 1.17.  Creatinine last admission 0.56 (08/20/2019). -Continue holding nephrotoxic meds  Abdominal discomfort Renal ultrasound results as above.  CXR showed no acute process in the chest.  Troponins trended flat (13x2). -Monitor for change -Repeat EKG, compared to 7/19  History of recent ischemic CVA Acute CVA hospitalization 08/12/2019.  Still has difficulty with words.  Given hematuria, holding Plavix and aspirin. -Follow-up repeat CBC -PT/OT eval and treat -PT recommends home health PT with 24-hour supervision -OT consulted, awaiting recommendations -Consider inpatient TEE (to do from  last admission) pending uro procedure and recommendations  History of recent carotid stent Carotid stent placement 08/20/2019 with Dr. Gwenlyn Saran, vascular surgery.  At home on aspirin and Plavix. -Continue holding Plavix for gross hematuria -May restart aspirin 325 when frank blood loss ceases  Hypertension  Vitals overnight, BP systolic 096-283, diastolic 66-294, pulse 76-54.  SPO2 95-100%. -Holding losartan 25 due to AKI -Continue amlodipine 5 -Continue metoprolol 25 -Continue to monitor BP  Hyperlipidemia -Continue home atorvastatin 80 mg daily -Heart healthy carb modified diet  Type 2 diabetes mellitus Hemoglobin A1c on admission for CVA was 8.2.  Home med Metformin 500 mg daily -Continue holding Metformin due to AKI -Sensitive sliding scale insulin  FEN/GI: Heart healthy carb modified PPx: SCDs  Disposition: MedSurg  Subjective:  Ms. Eckford was found sitting upright in bed this morning, awake, alert and conversational.  She reported no issues overnight.  The epigastric discomfort with which she was admitted has resolved.  Only known output of blood is in her urine.  Patient denies vomiting blood, coughing up blood, or blood in stool.  She reports feeling fine after her blood transfusion of 1 unit.  No issues since transfusion.  We discussed putting her back on a regular diet when she is back from her urology procedure.  Objective: Temp:  [98.3 F (36.8 C)-98.8 F (37.1 C)] 98.4 F (36.9 C) (07/20 1226) Pulse Rate:  [72-95] 78 (07/20 1226) Resp:  [15-22] 18 (07/20 1226) BP: (119-154)/(57-75) 154/70 (07/20 1226) SpO2:  [95 %-99 %] 99 % (07/20 1226)  Physical Exam: General: Awake, alert, oriented Cardiovascular: Regular rate and rhythm, no murmur auscultated Respiratory: Clear to auscultation bilaterally Abdomen: No TTP or rebound tenderness Extremities: No BLE edema GU: Urine in Foley bag was dark crimson color  Laboratory: Recent Labs  Lab  08/31/19 0855  08/31/19 2106 09/01/19 0520  WBC 17.4* 15.1* 11.9*  HGB 9.1* 7.9* 8.8*  HCT 28.4* 24.5* 26.3*  PLT 465* 425* 367   Recent Labs  Lab 08/31/19 0855 09/01/19 0520  NA 135 136  K 3.3* 3.3*  CL 97* 101  CO2 22 25  BUN 28* 24*  CREATININE 2.02* 1.17*  CALCIUM 9.1 8.7*  GLUCOSE 157* 129*    Imaging/Diagnostic Tests: No results found.   Ezequiel Essex, MD 09/01/2019, 1:40 PM PGY-1, [place] Intern pager: [phone], text pages welcome

## 2019-09-01 NOTE — Transfer of Care (Signed)
Immediate Anesthesia Transfer of Care Note  Patient: Lisa [name]  Procedure(s) Performed: CYSTOSCOPY CLOT EVACUATION WITH POSSIBLE FULGERATION (N/A Bladder)  Patient Location: PACU  Anesthesia Type:General  Level of Consciousness: drowsy and patient cooperative  Airway & Oxygen Therapy: Patient Spontanous Breathing  Post-op Assessment: Report given to RN and Post -op Vital signs reviewed and stable  Post vital signs: Reviewed and stable  Last Vitals:  Vitals Value Taken Time  BP 139/67 09/01/19 2102  Temp 36.9 C 09/01/19 2103  Pulse 89 09/01/19 2108  Resp 22 09/01/19 2108  SpO2 94 % 09/01/19 2108  Vitals shown include unvalidated device data.  Last Pain:  Vitals:   09/01/19 1226  TempSrc: Oral  PainSc:          Complications: No complications documented.

## 2019-09-01 NOTE — TOC Initial Note (Addendum)
Transition of Care [facility]) - Initial/Assessment Note    Patient Details  Name: Lisa [name] MRN: [id number] Date of Birth: May 31, 1950  [facility] CM/SW Contact:    Marilu Favre, RN Phone Number: 09/01/2019, 2:27 PM  Clinical Narrative:                 Spoke to patient at bedside. Patient alert and oriented.   Discussed PT recommendations for HHPT if patient has 24 / 7 supervision at home , if patient does not have supervision at home then PT recommendation is for SNF for short term rehab.   Patient voiced understanding and stated she does have 24/7 supervision at home. Patient declining SNF at this time. NCM offered to call patient's spouse and / or sister in law [place]. Patient declined.   Offered to start SNF search and provide offers and then patient could discuss/ decline at that time. Patient decline. Patient wants to go home with home health.   Patient does have a PCP , however, she does not remember name ( new PCP). NCM explained will need to know to arrange home health. NCM provided Medicare.gov list of home health agencies. Dr Jeani Hawking.  Patient requested NCM to follow up with her in the morning for PCP name and home health choice. Will need home health orders.   Expected Discharge Plan: [place]     Patient Goals and CMS Choice Patient states their goals for this hospitalization and ongoing recovery are:: to return to home CMS Medicare.gov Compare Post Acute Care list provided to:: Patient Choice offered to / list presented to : Patient  Expected Discharge Plan and Services Expected Discharge Plan: [place]   Discharge Planning Services: CM Consult Post Acute Care Choice: [place] arrangements for the past 2 months: Single Family Home                 DME Arranged: N/A                    Prior Living Arrangements/Services Living arrangements for the past 2 months: Single Family Home Lives  with:: Spouse Patient language and need for interpreter reviewed:: Yes Do you feel safe going back to the place where you live?: Yes      Need for Family Participation in Patient Care: Yes (Comment) Care giver support system in place?: Yes (comment)   Criminal Activity/Legal Involvement Pertinent to Current Situation/Hospitalization: No - Comment as needed  Activities of Daily Living      Permission Sought/Granted   Permission granted to share information with : No              Emotional Assessment Appearance:: Appears stated age Attitude/Demeanor/Rapport: Engaged Affect (typically observed): Accepting Orientation: : Oriented to Self, Oriented to Place, Oriented to  Time, Oriented to Situation Alcohol / Substance Use: Not Applicable Psych Involvement: No (comment)  Admission diagnosis:  Renal insufficiency [N28.9] Hematuria [R31.9] Anemia, unspecified type [D64.9] Chest pain, unspecified type [R07.9] Hematuria, unspecified type [R31.9] Gross hematuria [R31.0] Patient Active Problem List   Diagnosis Date Noted  . Gross hematuria 09/01/2019  . Hematuria 08/31/2019  . 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 Stenosis of carotid artery   . Abnormality of aortic valve   . DM (diabetes mellitus), type 2, uncontrolled w/neurologic complication ([place])   . Hypercholesteremia   . CVA (cerebral vascular accident) ([place]) 08/12/2019  .  Acute ischemic stroke ([place])   . Hypertension    PCP:  Patient, No Pcp Per Pharmacy:   [facility] [place], [place] - [address] [place] [address] [place] [address] Phone: [phone] Fax: [phone]     Social Determinants of Health (SDOH) Interventions    Readmission Risk Interventions No flowsheet data found.

## 2019-09-02 ENCOUNTER — Encounter (HOSPITAL_COMMUNITY): Payer: Self-pay | Admitting: Urology

## 2019-09-02 DIAGNOSIS — R310 Gross hematuria: Secondary | ICD-10-CM | POA: Diagnosis not present

## 2019-09-02 LAB — GLUCOSE, CAPILLARY
Glucose-Capillary: 166 mg/dL — ABNORMAL HIGH (ref 70–99)
Glucose-Capillary: 150 mg/dL — ABNORMAL HIGH (ref 70–99)
Glucose-Capillary: 130 mg/dL — ABNORMAL HIGH (ref 70–99)
Glucose-Capillary: 125 mg/dL — ABNORMAL HIGH (ref 70–99)
Glucose-Capillary: 126 mg/dL — ABNORMAL HIGH (ref 70–99)

## 2019-09-02 LAB — BASIC METABOLIC PANEL
Anion gap: 12 (ref 5–15)
BUN: 14 mg/dL (ref 8–23)
CO2: 22 mmol/L (ref 22–32)
Calcium: 8.6 mg/dL — ABNORMAL LOW (ref 8.9–10.3)
Chloride: 106 mmol/L (ref 98–111)
Creatinine, Ser: 0.82 mg/dL (ref 0.44–1.00)
GFR calc Af Amer: >60 mL/min (ref >60)
GFR calc non Af Amer: >60 mL/min (ref >60)
Glucose, Bld: 154 mg/dL — ABNORMAL HIGH (ref 70–99)
Potassium: 4.1 mmol/L (ref 3.5–5.1)
Sodium: 140 mmol/L (ref 135–145)

## 2019-09-02 LAB — CBC
HCT: 26.3 % — ABNORMAL LOW (ref 36.0–46.0)
HCT: 24.3 % — ABNORMAL LOW (ref 36.0–46.0)
Hemoglobin: 8.5 g/dL — ABNORMAL LOW (ref 12.0–15.0)
Hemoglobin: 7.8 g/dL — ABNORMAL LOW (ref 12.0–15.0)
MCH: 28.6 pg (ref 26.0–34.0)
MCH: 28.3 pg (ref 26.0–34.0)
MCHC: 32.3 g/dL (ref 30.0–36.0)
MCHC: 32.1 g/dL (ref 30.0–36.0)
MCV: 88.6 fL (ref 80.0–100.0)
MCV: 88 fL (ref 80.0–100.0)
Platelets: 399 10*3/uL (ref 150–400)
Platelets: 373 10*3/uL (ref 150–400)
RBC: 2.97 MIL/uL — ABNORMAL LOW (ref 3.87–5.11)
RBC: 2.76 MIL/uL — ABNORMAL LOW (ref 3.87–5.11)
RDW: 13.8 % (ref 11.5–15.5)
RDW: 13.8 % (ref 11.5–15.5)
WBC: 15.5 10*3/uL — ABNORMAL HIGH (ref 4.0–10.5)
WBC: 15.8 10*3/uL — ABNORMAL HIGH (ref 4.0–10.5)
nRBC: 0 % (ref 0.0–0.2)
nRBC: 0 % (ref 0.0–0.2)

## 2019-09-02 LAB — MAGNESIUM: Magnesium: 1.5 mg/dL — ABNORMAL LOW (ref 1.7–2.4)

## 2019-09-02 NOTE — Anesthesia Postprocedure Evaluation (Signed)
Anesthesia Post Note  Patient: Lisa [name]  Procedure(s) Performed: CYSTOSCOPY CLOT EVACUATION WITH POSSIBLE FULGERATION (N/A Bladder)     Patient location during evaluation: PACU Anesthesia Type: General Level of consciousness: awake and alert Pain management: pain level controlled Vital Signs Assessment: post-procedure vital signs reviewed and stable Respiratory status: spontaneous breathing, nonlabored ventilation and respiratory function stable Cardiovascular status: blood pressure returned to baseline and stable Postop Assessment: no apparent nausea or vomiting Anesthetic complications: no   No complications documented.  Last Vitals:  Vitals:   09/01/19 2215 09/01/19 2311  BP: (!) 142/64 (!) 145/69  Pulse: 87 79  Resp: (!) 22 17  Temp:    SpO2: 97% 96%    Last Pain:  Vitals:   09/01/19 2311  TempSrc:   PainSc: 0-No pain                 Audry Pili

## 2019-09-02 NOTE — Progress Notes (Signed)
Patient's husband visiting patient with his sister present.  Husband had odor of alcohol and speech was clearly slurred.  Gait was unsteady.  For patient's safety, he was closely observed throughout his visitation period.  Behavior was animated.  He stated his AA sponsor had driven him to the hospital and she was waiting in the car for him.

## 2019-09-02 NOTE — Progress Notes (Signed)
Family Medicine Teaching Service Daily Progress Note Intern Pager: [phone]  Patient name: Lisa [name] Medical record number: [id number] Date of birth: 19-Dec-1950 Age: 69 y.o. Gender: female  Primary Care Provider: Patient, No Pcp Per Consultants: Urology Code Status: Full  Pt Overview and Major Events to Date:  Admitted 7/19 7/20 cystoscopy with fulguration  Assessment and Plan: Lisa [name] is a 69 year old female presenting with upper abdominal discomfort and gross hematuria, found to be anemic with a bladder tumor (staging TBD).  PMH significant for recent ischemic CVA, recent carotid stent placement, HTN, HLD, DM type II.  Bladder tumor  anemia  gross hematuria Cystoscopy intraoperative hemoglobin 8.9 > 9.2 > 8.5 today.  BP systolic 681-157, diastolic 26-20, 35-597.  SPO2 95-99%.  Cystoscopy 7/20 with Dr. Claudia Desanctis showed bladder tumor greater than 5 cm. Continuous bladder irrigation overnight produced tea colored urine, DC'd this morning per urology.  Urology's note says she would likely need restaging TURBT pending path results. -Continue holding home Plavix 75 -Continue aspirin 325 -Follow-up on pathology results -Follow-up CBC x2 today -SCDs -Strict I's and O's -Consider urine culture -Consider repeat UA -Possible void trial tomorrow per urology  AKI Creatinine 2.02 > 1.17 > 0.70 > 0.82 today.  Creatinine last admission 0.56 (08/20/2019). -Continue holding nephrotoxic meds  Abdominal discomfort Renal ultrasound results as above.  CXR showed no acute process in the chest.  Troponins trended flat (13x2). -Monitor for change  History of recent ischemic CVA Acute CVA hospitalization 08/12/2019.  Still has difficulty with words.  Given hematuria, holding Plavix, but still giving aspirin. -Follow-up repeat CBC -PT/OT eval and treat -PT recommends home health PT with 24-hour supervision -OT recommends no follow-up -Consider inpatient TEE (to do from last admission)  pending uro procedure and recommendations  History of recent carotidstent Carotid stent placement 08/20/2019 with Dr. Gwenlyn Saran, vascular surgery.  At home on aspirin and Plavix. -Continue holding Plavix for gross hematuria -May restart aspirin 325 when frank blood loss ceases  Hypertension BP systolic 416-384, diastolic 53-64, 68-032.  SPO2 95-99% -Holding losartan 25 due to AKI -Continue amlodipine 5 -Continue metoprolol 25 -Continue to monitor BP  Hyperlipidemia -Continue home atorvastatin 80 mg daily -Heart healthy carb modified diet  Type 2 diabetes mellitus Hemoglobin A1c on admission for CVA was 8.2.  Home med Metformin 500 mg daily -Continue holding Metformin due to AKI -Sensitive sliding scale insulin   FEN/GI: Heart healthy carb modified PPx: SCDs  Disposition: MedSurg  Subjective:  Lisa [name] was found laying in bed, awake, alert, and oriented.  Sister-in-law [name] at the bedside.  Patient reports feeling fine, no pain.  No abdominal or pelvic discomfort or pain.  Epigastric discomfort present on admission no longer there.  She understands that yesterday's cystoscopy found a tumor in her bladder and that now we are waiting on pathology results.  Sister-in-law was updated at the bedside.  Patient reports preferring to go home with home health and her husband after discharge, as compared to a SNF.  Objective: Temp:  [98 F (36.7 C)-98.6 F (37 C)] 98.2 F (36.8 C) (07/21 1233) Pulse Rate:  [78-104] 78 (07/21 1233) Resp:  [16-25] 16 (07/21 1233) BP: (133-155)/(61-76) 139/72 (07/21 1233) SpO2:  [95 %-100 %] 100 % (07/21 1233)  Physical Exam: General: Awake, alert, oriented Cardiovascular: Regular rate and rhythm, no murmurs auscultation Respiratory: Clear to auscultation bilaterally Abdomen: Soft, nondistended, no TTP, no rebound tenderness Extremities: No BLE edema  Laboratory: Recent Labs  Lab 09/01/19 0520 09/01/19 0520  09/01/19 1323 09/01/19 1909  09/02/19 0637  WBC 11.9*  --  14.1*  --  15.5*  HGB 8.8*   < > 8.9* 9.2* 8.5*  HCT 26.3*   < > 27.0* 27.0* 26.3*  PLT 367  --  392  --  399   < > = values in this interval not displayed.   Recent Labs  Lab 08/31/19 0855 08/31/19 0855 09/01/19 0520 09/01/19 1909 09/02/19 0533  NA 135   < > 136 138 140  K 3.3*   < > 3.3* 4.1 4.1  CL 97*   < > 101 104 106  CO2 22  --  25  --  22  BUN 28*   < > 24* 17 14  CREATININE 2.02*   < > 1.17* 0.70 0.82  CALCIUM 9.1  --  8.7*  --  8.6*  GLUCOSE 157*   < > 129* 157* 154*   < > = values in this interval not displayed.    Imaging/Diagnostic Tests: No results found.   Ezequiel Essex, MD 09/02/2019, 3:23 PM PGY-1, [place] Intern pager: [phone], text pages welcome

## 2019-09-02 NOTE — Progress Notes (Addendum)
Husband called re:  His wife's status.  He is seeking information re:  His wife's surgery, outcomes, and potential DOD.  Very upset as he feels his wife "may have cancer", though this was never discussed with him.  Message passed on to surgeon to call him.  Surgeon returned message and will be calling him.

## 2019-09-02 NOTE — Progress Notes (Signed)
Urology Inpatient Progress Report  Renal insufficiency [N28.9] Hematuria [R31.9] Anemia, unspecified type [D64.9] Chest pain, unspecified type [R07.9] Hematuria, unspecified type [R31.9] Gross hematuria [R31.0]  Procedure(s): CYSTOSCOPY CLOT EVACUATION WITH POSSIBLE FULGERATION  1 Day Post-Op   Intv/Subj: Patient is without complaint.  She is feeling great this AM.    Active Problems:   Hematuria   Gross hematuria  Current Facility-Administered Medications  Medication Dose Route Frequency Provider Last Rate Last Admin  . 0.9 %  sodium chloride infusion   Intravenous Continuous Gifford Shave, MD 130 mL/hr at 09/02/19 0623 New Bag at 09/02/19 7588  . acetaminophen (TYLENOL) tablet 650 mg  650 mg Oral Q6H PRN Gifford Shave, MD       Or  . acetaminophen (TYLENOL) suppository 650 mg  650 mg Rectal Q6H PRN Gifford Shave, MD      . amLODipine (NORVASC) tablet 5 mg  5 mg Oral Daily Gifford Shave, MD   5 mg at 09/01/19 3254  . aspirin tablet 325 mg  325 mg Oral Daily Ezequiel Essex, MD   325 mg at 09/01/19 1301  . atorvastatin (LIPITOR) tablet 80 mg  80 mg Oral Daily Gifford Shave, MD   80 mg at 09/01/19 0929  . bisacodyl (DULCOLAX) suppository 10 mg  10 mg Rectal Daily Ezequiel Essex, MD      . Chlorhexidine Gluconate Cloth 2 % PADS 6 each  6 each Topical Daily Leeanne Rio, MD   6 each at 09/01/19 [phone]  . insulin aspart (novoLOG) injection 0-9 Units  0-9 Units Subcutaneous TID WC Gifford Shave, MD   1 Units at 09/02/19 0840  . metoprolol tartrate (LOPRESSOR) tablet 25 mg  25 mg Oral BID Gifford Shave, MD   25 mg at 09/01/19 0929  . opium-belladonna (B&O) suppository 16.2-60mg   1 suppository Rectal Once Jacalyn Lefevre D, MD      . sodium chloride irrigation 0.9 % 3,000 mL  3,000 mL Irrigation Continuous Jacalyn Lefevre D, MD   3,000 mL at 09/01/19 2200  . white petrolatum (VASELINE) gel   Topical PRN Leeanne Rio, MD          Objective: Vital: Vitals:   09/01/19 2200 09/01/19 2215 09/01/19 2311 09/02/19 0433  BP: (!) 141/65 (!) 142/64 (!) 145/69 (!) 155/76  Pulse: 87 87 79 84  Resp: 20 (!) 22 17 17   Temp: 98.6 F (37 C)   98 F (36.7 C)  TempSrc:    Oral  SpO2: 96% 97% 96% 97%  Weight:      Height:       I/Os: I/O last 3 completed shifts: In: 9660.5 [I.V.:3345.5; Blood:315; Other:6000] Out: 4450 [Urine:4450]  Physical Exam:  General: Patient is in no apparent distress Lungs: Normal respiratory effort, chest expands symmetrically. GI: The abdomen is soft and nontender  Foley: 22Fr 3 way with CBI turned off, urine ice tea colored with small sediment, draining well  Ext: lower extremities symmetric  Lab Results: Recent Labs    09/01/19 0520 09/01/19 0520 09/01/19 1323 09/01/19 1909 09/02/19 0637  WBC 11.9*  --  14.1*  --  15.5*  HGB 8.8*   < > 8.9* 9.2* 8.5*  HCT 26.3*   < > 27.0* 27.0* 26.3*   < > = values in this interval not displayed.   Recent Labs    08/31/19 0855 08/31/19 0855 09/01/19 0520 09/01/19 1909 09/02/19 0533  NA 135   < > 136 138 140  K 3.3*   < >  3.3* 4.1 4.1  CL 97*   < > 101 104 106  CO2 22  --  25  --  22  GLUCOSE 157*   < > 129* 157* 154*  BUN 28*   < > 24* 17 14  CREATININE 2.02*   < > 1.17* 0.70 0.82  CALCIUM 9.1  --  8.7*  --  8.6*   < > = values in this interval not displayed.   Recent Labs    08/31/19 0855  INR 1.1   No results for input(s): LABURIN in the last 72 hours. Results for orders placed or performed during the hospital encounter of 08/31/19  SARS Coronavirus 2 by RT PCR (hospital order, performed in [facility] hospital lab) Nasopharyngeal Nasopharyngeal Swab     Status: None   Collection Time: 08/31/19  1:47 PM   Specimen: Nasopharyngeal Swab  Result Value Ref Range Status   SARS Coronavirus 2 NEGATIVE NEGATIVE Final    Comment: (NOTE) SARS-CoV-2 target nucleic acids are NOT DETECTED.  The SARS-CoV-2 RNA is generally  detectable in upper and lower respiratory specimens during the acute phase of infection. The lowest concentration of SARS-CoV-2 viral copies this assay can detect is 250 copies / mL. A negative result does not preclude SARS-CoV-2 infection and should not be used as the sole basis for treatment or other patient management decisions.  A negative result may occur with improper specimen collection / handling, submission of specimen other than nasopharyngeal swab, presence of viral mutation(s) within the areas targeted by this assay, and inadequate number of viral copies (<250 copies / mL). A negative result must be combined with clinical observations, patient history, and epidemiological information.  Fact Sheet for Patients:   StrictlyIdeas.no  Fact Sheet for Healthcare Providers: BankingDealers.co.za  This test is not yet approved or  cleared by the [place] FDA and has been authorized for detection and/or diagnosis of SARS-CoV-2 by FDA under an Emergency Use Authorization (EUA).  This EUA will remain in effect (meaning this test can be used) for the duration of the COVID-19 declaration under Section 564(b)(1) of the Act, 21 U.S.C. section 360bbb-3(b)(1), unless the authorization is terminated or revoked sooner.  Performed at [facility], [place] [address] [place], [place] [address]     Studies/Results: US RENAL  Result Date: 08/31/2019 CLINICAL DATA:  Renal insufficiency EXAM: RENAL / URINARY TRACT ULTRASOUND COMPLETE COMPARISON:  None. FINDINGS: Right Kidney: Renal measurements: 11.7 x 5.4 x 5.9 cm = volume: 198.5 mL . Echogenicity within normal limits. No mass or hydronephrosis visualized. Left Kidney: Renal measurements: 11 4 x 6.1 x 6 cm = volume: 219.6 mL. Echogenicity within normal limits. No mass or hydronephrosis visualized. Bladder: There is heterogeneous echogenicity within the lumen measuring 5.4 x 4.6 x 5 cm. No  associated flow on color Doppler. Other: None. IMPRESSION: No hydronephrosis. Heterogeneous echogenicity within the bladder lumen, which may reflect clot or less likely a mass. Electronically Signed   By: Macy Mis M.D.   On: 08/31/2019 11:45   DG Chest Portable 1 View  Result Date: 08/31/2019 CLINICAL DATA:  Chest pain EXAM: PORTABLE CHEST 1 VIEW COMPARISON:  None. FINDINGS: The heart size and mediastinal contours are within normal limits. Both lungs are clear. No pleural effusion or pneumothorax. The visualized skeletal structures are unremarkable. IMPRESSION: No acute process in the chest. Electronically Signed   By: Macy Mis M.D.   On: 08/31/2019 09:12    Assessment: 69 yo woman with recent  CVA on anticoagulation ASA and plavix who presented to ED with 2 weeks of gross hematuria and feeling poorly found to have acute blood loss anemia and large clot in her bladder.  Patient taken to operating room last night for clot evacuation and found to have significant bladder tumor burden.  Clot evacuation and tumor resection > 4 hours.  She is doing well on CBI.   Plan: -CBI can remain turned off if urine stays tea colored/light red; possible void trial tomorrow -will follow up pathology -discussed intraoperative findings with wife; she had asked me to stop bothering her husband last night before surgery however he called this morning and spoke with RN; have attempted returning call several times and will discuss intraop findings any time -patient will likely need restaging TURBT in future pending pathology   Jacalyn Lefevre, MD Urology 09/02/2019, 8:52 AM

## 2019-09-02 NOTE — Progress Notes (Signed)
   Urology intervention noted.  Restart Plavix when okay from urology standpoint.  She has follow-up with me in a couple weeks to evaluate recently placed carotid stent.  Darian Cansler C. Donzetta Matters, MD [facility] Office: [phone] Pager: [phone]

## 2019-09-02 NOTE — TOC Progression Note (Addendum)
Transition of Care [facility]) - Progression Note    Patient Details  Name: Lisa [name] MRN: [id number] Date of Birth: August 14, 1950  Transition of Care [facility]) CM/SW Contact  Jacalyn Lefevre Edson Snowball, RN Phone Number: 09/02/2019, 10:49 AM  Clinical Narrative:     Spoke to patient and sister in law Lisa [name])  at bedside.  Lisa [name] has HCPOA papers she wants to complete. NCM explained bedside nurse will call Spiritual Care .      Patient wants to go home with home health ( active with [facility]) and her husband can provide 24 hour supervision.   Lisa [name] has concerns that patient's husband Lisa [name]) cannot provide 24 / 7 supervision. Patient wants to go home and try. Lisa [name] and patient requesting walker and 3 in 1.   Lisa [name] followed NCM into hallway. Lisa [name] does not feel that patient is able to make decisions for herself at this time. NCM will discuss with MD . Secure message sent . Dr Romeo Apple aware and will assess patient. Await assignment.    Cassie with [facility] reported patient active prior to admission  for PT/OT/ST. Will need resumption of care orders if patient discharges to home at discharge.  OT recommends 3 in 1 , PT does not recommend a walker. WIll need order for 3 in1 if patient discharges to home. Expected Discharge Plan: [place]    Expected Discharge Plan and Services Expected Discharge Plan: [place]   Discharge Planning Services: CM Consult Post Acute Care Choice: [place] arrangements for the past 2 months: Single Family Home                 DME Arranged: N/A                     Social Determinants of Health (SDOH) Interventions    Readmission Risk Interventions No flowsheet data found.

## 2019-09-03 DIAGNOSIS — R319 Hematuria, unspecified: Secondary | ICD-10-CM | POA: Diagnosis not present

## 2019-09-03 LAB — BASIC METABOLIC PANEL
Anion gap: 8 (ref 5–15)
BUN: 7 mg/dL — ABNORMAL LOW (ref 8–23)
CO2: 25 mmol/L (ref 22–32)
Calcium: 8.5 mg/dL — ABNORMAL LOW (ref 8.9–10.3)
Chloride: 110 mmol/L (ref 98–111)
Creatinine, Ser: 0.56 mg/dL (ref 0.44–1.00)
GFR calc Af Amer: >60 mL/min (ref >60)
GFR calc non Af Amer: >60 mL/min (ref >60)
Glucose, Bld: 116 mg/dL — ABNORMAL HIGH (ref 70–99)
Potassium: 3.4 mmol/L — ABNORMAL LOW (ref 3.5–5.1)
Sodium: 143 mmol/L (ref 135–145)

## 2019-09-03 LAB — CBC
HCT: 23.8 % — ABNORMAL LOW (ref 36.0–46.0)
Hemoglobin: 7.7 g/dL — ABNORMAL LOW (ref 12.0–15.0)
MCH: 29.1 pg (ref 26.0–34.0)
MCHC: 32.4 g/dL (ref 30.0–36.0)
MCV: 89.8 fL (ref 80.0–100.0)
Platelets: 372 10*3/uL (ref 150–400)
RBC: 2.65 MIL/uL — ABNORMAL LOW (ref 3.87–5.11)
RDW: 13.9 % (ref 11.5–15.5)
WBC: 12.3 10*3/uL — ABNORMAL HIGH (ref 4.0–10.5)
nRBC: 0 % (ref 0.0–0.2)

## 2019-09-03 LAB — SURGICAL PATHOLOGY

## 2019-09-03 LAB — GLUCOSE, CAPILLARY
Glucose-Capillary: 110 mg/dL — ABNORMAL HIGH (ref 70–99)
Glucose-Capillary: 113 mg/dL — ABNORMAL HIGH (ref 70–99)
Glucose-Capillary: 150 mg/dL — ABNORMAL HIGH (ref 70–99)
Glucose-Capillary: 118 mg/dL — ABNORMAL HIGH (ref 70–99)

## 2019-09-03 MED ORDER — AMLODIPINE BESYLATE 10 MG PO TABS
10.0000 mg | ORAL_TABLET | Freq: Every day | ORAL | Status: DC
  Administered 2019-09-03 – 2019-09-07 (×5): 10 mg via ORAL
  Filled 2019-09-03 (×5): qty 1

## 2019-09-03 MED ORDER — POTASSIUM CHLORIDE CRYS ER 20 MEQ PO TBCR
40.0000 meq | EXTENDED_RELEASE_TABLET | Freq: Once | ORAL | Status: AC
  Administered 2019-09-03: 40 meq via ORAL
  Filled 2019-09-03: qty 2

## 2019-09-03 NOTE — TOC Progression Note (Signed)
Transition of Care [facility]) - Progression Note    Patient Details  Name: Lisa [name] MRN: [id number] Date of Birth: 04-06-1950  Transition of Care [facility]) CM/SW [place], [place] Phone Number: 09/03/2019, 5:41 PM  Clinical Narrative:    Notified by MD that pt amenable to considering SNF. Faxed out and initiated insurance auth. Due to pt functional status MD and RNCM has spoken with pt sister in law and pt about plan B d/c home if insurance does not approve stay (no Medicaid or LTC insurance noted).   Ongoing medical w/u  Expected Discharge Plan: [place]    Expected Discharge Plan and Services Expected Discharge Plan: [place]  Discharge Planning Services: CM Consult Post Acute Care Choice: [place] arrangements for the past 2 months: Single Family Home                 DME Arranged: N/A   Readmission Risk Interventions No flowsheet data found.

## 2019-09-03 NOTE — Progress Notes (Signed)
Went to bedside to speak with Lisa [name] in regards to SNF placement vs home for future discharge planning.  Her sister-in-law, [name], has been advocating for the patient to go to a rehab facility for short period of time to allow both the patient and her husband to recover.  She currently would not be able to have 24-hour care at home.   While Lisa [name] reports she would rather go home with 24-hour care, she understands this is not a possibility at this time (24 hour care specifically) and believes it would be reasonable to proceed with SNF for a short stay.  Communicated this to SW/CM.   Patriciaann Clan, DO

## 2019-09-03 NOTE — Progress Notes (Signed)
Physical Therapy Treatment Patient Details Name: Lisa [name] MRN: [id number] DOB: 10/19/1950 Today's Date: 09/03/2019    History of Present Illness Pt is a 69 y/o female admitted secondary to increased abdominal discomfort. Found to have hematuria; workup pending. Pt with recent CVA. PMH includes CVA, HTN and DM.     PT Comments    Pt making steady progress overall towards achieving her current functional mobility goals. She demonstrated improved cognition and stability with mobility. Pt would continue to benefit from skilled physical therapy services at this time while admitted and after d/c to address the below listed limitations in order to improve overall safety and independence with functional mobility.    Follow Up Recommendations  Home health PT;Supervision/Assistance - 24 hour     Equipment Recommendations  None recommended by PT    Recommendations for Other Services       Precautions / Restrictions Precautions Precautions: Fall Restrictions Weight Bearing Restrictions: No    Mobility  Bed Mobility Overal bed mobility: Needs Assistance Bed Mobility: Supine to Sit;Sit to Supine     Supine to sit: Supervision Sit to supine: Supervision   General bed mobility comments: for safety, lines/leads management.  Transfers Overall transfer level: Needs assistance Equipment used: None Transfers: Sit to/from Stand Sit to Stand: Supervision         General transfer comment: supervision for safety and line management, steady overall with transition  Ambulation/Gait Ambulation/Gait assistance: Min guard Gait Distance (Feet): 200 Feet Assistive device: IV Pole Gait Pattern/deviations: Step-through pattern;Decreased stride length;Drifts right/left Gait velocity: decr   General Gait Details: pt with a quick pace once in hallway, able to maneuver around obstacles safely, no LOB or need for physical assistance, min guard for safety   Stairs              Wheelchair Mobility    Modified Rankin (Stroke Patients Only)       Balance Overall balance assessment: Needs assistance Sitting-balance support: No upper extremity supported;Feet supported Sitting balance-Leahy Scale: Good     Standing balance support: Single extremity supported Standing balance-Leahy Scale: Poor                              Cognition Arousal/Alertness: Awake/alert Behavior During Therapy: WFL for tasks assessed/performed Overall Cognitive Status: No family/caregiver present to determine baseline cognitive functioning Area of Impairment: Following commands;Memory;Safety/judgement;Problem solving                     Memory: Decreased short-term memory Following Commands: Follows one step commands with increased time Safety/Judgement: Decreased awareness of safety;Decreased awareness of deficits Awareness: Emergent Problem Solving: Slow processing;Difficulty sequencing        Exercises      General Comments        Pertinent Vitals/Pain Pain Assessment: No/denies pain    Home Living                      Prior Function            PT Goals (current goals can now be found in the care plan section) Acute Rehab PT Goals PT Goal Formulation: With patient Time For Goal Achievement: 09/14/19 Potential to Achieve Goals: Good Progress towards PT goals: Progressing toward goals    Frequency    Min 3X/week      PT Plan Current plan remains appropriate    Co-evaluation  AM-PAC PT "6 Clicks" Mobility   Outcome Measure  Help needed turning from your back to your side while in a flat bed without using bedrails?: None Help needed moving from lying on your back to sitting on the side of a flat bed without using bedrails?: None Help needed moving to and from a bed to a chair (including a wheelchair)?: A Little Help needed standing up from a chair using your arms (e.g., wheelchair or bedside  chair)?: None Help needed to walk in hospital room?: A Little Help needed climbing 3-5 steps with a railing? : A Little 6 Click Score: 21    End of Session Equipment Utilized During Treatment: Gait belt Activity Tolerance: Patient tolerated treatment well Patient left: in bed;with call bell/phone within reach;with bed alarm set Nurse Communication: Mobility status PT Visit Diagnosis: Unsteadiness on feet (R26.81);Difficulty in walking, not elsewhere classified (R26.2)     Time: 4715-9539 PT Time Calculation (min) (ACUTE ONLY): 26 min  Charges:  $Gait Training: 8-22 mins $Therapeutic Activity: 8-22 mins                     Anastasio Champion, DPT  Acute Rehabilitation Services Pager [phone] Office [place] 09/03/2019, 9:51 AM

## 2019-09-03 NOTE — Progress Notes (Signed)
Urology Inpatient Progress Report  Renal insufficiency [N28.9] Hematuria [R31.9] Anemia, unspecified type [D64.9] Chest pain, unspecified type [R07.9] Hematuria, unspecified type [R31.9] Gross hematuria [R31.0]  Procedure(s): CYSTOSCOPY CLOT EVACUATION WITH POSSIBLE FULGERATION  2 Days Post-Op   Intv/Subj: No acute events overnight. Patient is without complaint.  Active Problems:   Hematuria   Gross hematuria  Current Facility-Administered Medications  Medication Dose Route Frequency Provider Last Rate Last Admin  . 0.9 %  sodium chloride infusion   Intravenous Continuous Gifford Shave, MD 130 mL/hr at 09/03/19 0542 New Bag at 09/03/19 0542  . acetaminophen (TYLENOL) tablet 650 mg  650 mg Oral Q6H PRN Gifford Shave, MD       Or  . acetaminophen (TYLENOL) suppository 650 mg  650 mg Rectal Q6H PRN Gifford Shave, MD      . amLODipine (NORVASC) tablet 5 mg  5 mg Oral Daily Gifford Shave, MD   5 mg at 09/02/19 1101  . aspirin tablet 325 mg  325 mg Oral Daily Ezequiel Essex, MD   325 mg at 09/02/19 1100  . atorvastatin (LIPITOR) tablet 80 mg  80 mg Oral Daily Gifford Shave, MD   80 mg at 09/02/19 1100  . bisacodyl (DULCOLAX) suppository 10 mg  10 mg Rectal Daily Ezequiel Essex, MD      . Chlorhexidine Gluconate Cloth 2 % PADS 6 each  6 each Topical Daily Leeanne Rio, MD   6 each at 09/02/19 1100  . insulin aspart (novoLOG) injection 0-9 Units  0-9 Units Subcutaneous TID WC Gifford Shave, MD   1 Units at 09/02/19 1700  . metoprolol tartrate (LOPRESSOR) tablet 25 mg  25 mg Oral BID Gifford Shave, MD   25 mg at 09/02/19 2016  . opium-belladonna (B&O) suppository 16.2-60mg   1 suppository Rectal Once Jacalyn Lefevre D, MD      . sodium chloride irrigation 0.9 % 3,000 mL  3,000 mL Irrigation Continuous Jacalyn Lefevre D, MD   3,000 mL at 09/01/19 2200  . white petrolatum (VASELINE) gel   Topical PRN Leeanne Rio, MD          Objective: Vital: Vitals:   09/02/19 2017 09/02/19 2324 09/03/19 0142 09/03/19 0516  BP: 137/61 (!) 159/63 106/69 (!) 178/75  Pulse: 76 76 86 83  Resp:  16 18 18   Temp: 98 F (36.7 C) 98 F (36.7 C) 98.5 F (36.9 C) 98.1 F (36.7 C)  TempSrc: Oral Oral Oral Oral  SpO2: 96% 96% 100% 94%  Weight:      Height:       I/Os: I/O last 3 completed shifts: In: 14475.3 [P.O.:1520; I.V.:3955.3; Other:9000] Out: 11900 [Urine:11900]  Physical Exam:  General: Patient is in no apparent distress Lungs: Normal respiratory effort, chest expands symmetrically. GI:  The abdomen is soft and nontender  Foley: draining clear yellow pink tinged with CBI off Ext: lower extremities symmetric  Lab Results: Recent Labs    09/02/19 0637 09/02/19 1638 09/03/19 0428  WBC 15.5* 15.8* 12.3*  HGB 8.5* 7.8* 7.7*  HCT 26.3* 24.3* 23.8*   Recent Labs    09/01/19 0520 09/01/19 0520 09/01/19 1909 09/02/19 0533 09/03/19 0428  NA 136   < > 138 140 143  K 3.3*   < > 4.1 4.1 3.4*  CL 101   < > 104 106 110  CO2 25  --   --  22 25  GLUCOSE 129*   < > 157* 154* 116*  BUN 24*   < >  17 14 7*  CREATININE 1.17*   < > 0.70 0.82 0.56  CALCIUM 8.7*  --   --  8.6* 8.5*   < > = values in this interval not displayed.   Recent Labs    08/31/19 0855  INR 1.1   No results for input(s): LABURIN in the last 72 hours. Results for orders placed or performed during the hospital encounter of 08/31/19  SARS Coronavirus 2 by RT PCR (hospital order, performed in [facility] hospital lab) Nasopharyngeal Nasopharyngeal Swab     Status: None   Collection Time: 08/31/19  1:47 PM   Specimen: Nasopharyngeal Swab  Result Value Ref Range Status   SARS Coronavirus 2 NEGATIVE NEGATIVE Final    Comment: (NOTE) SARS-CoV-2 target nucleic acids are NOT DETECTED.  The SARS-CoV-2 RNA is generally detectable in upper and lower respiratory specimens during the acute phase of infection. The lowest concentration of  SARS-CoV-2 viral copies this assay can detect is 250 copies / mL. A negative result does not preclude SARS-CoV-2 infection and should not be used as the sole basis for treatment or other patient management decisions.  A negative result may occur with improper specimen collection / handling, submission of specimen other than nasopharyngeal swab, presence of viral mutation(s) within the areas targeted by this assay, and inadequate number of viral copies (<250 copies / mL). A negative result must be combined with clinical observations, patient history, and epidemiological information.  Fact Sheet for Patients:   StrictlyIdeas.no  Fact Sheet for Healthcare Providers: BankingDealers.co.za  This test is not yet approved or  cleared by the [place] FDA and has been authorized for detection and/or diagnosis of SARS-CoV-2 by FDA under an Emergency Use Authorization (EUA).  This EUA will remain in effect (meaning this test can be used) for the duration of the COVID-19 declaration under Section 564(b)(1) of the Act, 21 U.S.C. section 360bbb-3(b)(1), unless the authorization is terminated or revoked sooner.  Performed at [facility], [place] [address] [place], [place] [address]     Studies/Results: No results found.  Assessment: 69 yo woman with recent CVA on anticoagulation ASA and plavix who presented to ED with 2 weeks of gross hematuria and feeling poorly found to have acute blood loss anemia and large clot in her bladder POD2 s/p TURBT and clot evaucation now with CBI turned off.    Plan: -please d/c foley tomorrow AM and void trial -would hold plavix another 2 days due to color of urine -will follow up pathology -will arrange outpatient follow up at [facility]   Jacalyn Lefevre, MD Urology 09/03/2019, 8:03 AM

## 2019-09-03 NOTE — Progress Notes (Signed)
Family Medicine Teaching Service Daily Progress Note Intern Pager: [phone]  Patient name: Lisa [name] Medical record number: [id number] Date of birth: Jul 29, 1950 Age: 69 y.o. Gender: female  Primary Care Provider: Patient, No Pcp Per Consultants: Urology Code Status: Full  Pt Overview and Major Events to Date:  Urology admitted 7/19 7/20 cystoscopy with fulguration  Assessment and Plan: Lisa [name] is a 69 year old female presenting with upper abdominal discomfort and gross hematuria, found to be anemic with significant bladder tumor burden.  PMH significant for recent ischemic CVA, recent carotid stent placement, HTN, HLD, DM2.  Bladder tumor  anemia  gross hematuria Vitals overnight little hypertensive.  BP systolic 732-202, diastolic 54-27, pulse 06-23.  SPO2 94-100% RA.  Biopsy taken during 7/20 cystoscopy awaiting path results.  S/p continuous bladder irrigation.  Urology reports likely will need restaging TURBT pending path results.  Hemoglobin 7.7 this morning, stable from yesterday. -Per urology, possible voiding trial today -Holding home Plavix 75 -Continuing aspirin 325 -Continue monitoring repeat CBCs -SCDs -Strict I's and O's -Consider urine culture repeat UA -Consider blood transfusion today, given conservative Hgb threshold of 8 due to comorbidities  AKI: Resolved Creatinine last admission 0.56 (08/20/2019).  Creatinine today has normalized to 0.56. -Continue to monitor creatinine  History of recent ischemic CVA  history of recent carotid stent Hospitalization 08/12/2019.  Carotid stent placement 08/20/2019 with Dr. Gwenlyn Saran, vascular surgery.  Has word finding difficulties.  Unsure whether or not patient understands indications for hospitalization and health status versus difficulty with expression. -Still holding Plavix, will restart when urology okays -Continue aspirin  Hypertension Vitals overnight little hypertensive.  BP systolic 762-831, diastolic 51-76,  pulse 16-07.  SPO2 94-100% RA. -Continue metoprolol 25 -Increase amlodipine to 10 mg from 5 -Plan to start losartan 25 tomorrow, increasing to 50 the day after (given tolerance and okay vitals) -Continue monitor BP  Hyperlipidemia -Heart healthy carb modified diet -Continue home atorvastatin 80 daily  DM 2 FS glucose 110-130 last day or so. -Sensitive sliding scale insulin -We will advise restarting Metformin after discharge at home  FEN/GI: Heart healthy carb modified PPx: SCDs  Disposition: MedSurg  Subjective:  Lisa [name] was found standing up at bedside with physical therapy this morning upon entering her room.  She was in good spirits and had no complaints for me.  She reports feeling fine, with no dizziness, lightheadedness, or near syncope.  Hematuria has improved, urine now tea colored instead of frankly red.  We discussed that we are still waiting on the pathology, and that urology will direct [place] after we get the result.  Objective: Temp:  [97.9 F (36.6 C)-98.5 F (36.9 C)] 98.1 F (36.7 C) (07/22 0516) Pulse Rate:  [76-86] 83 (07/22 0516) Resp:  [16-18] 18 (07/22 0516) BP: (106-178)/(61-75) 178/75 (07/22 0516) SpO2:  [94 %-100 %] 94 % (07/22 0516)  Physical Exam: General: Awake, alert, standing Cardiovascular: Regular rate and rhythm, no murmurs auscultated Respiratory: Clear to auscultation bilaterally Extremities: No BLE edema  Laboratory: Recent Labs  Lab 09/02/19 0637 09/02/19 1638 09/03/19 0428  WBC 15.5* 15.8* 12.3*  HGB 8.5* 7.8* 7.7*  HCT 26.3* 24.3* 23.8*  PLT 399 373 372   Recent Labs  Lab 09/01/19 0520 09/01/19 0520 09/01/19 1909 09/02/19 0533 09/03/19 0428  NA 136   < > 138 140 143  K 3.3*   < > 4.1 4.1 3.4*  CL 101   < > 104 106 110  CO2 25  --   --  22  25  BUN 24*   < > 17 14 7*  CREATININE 1.17*   < > 0.70 0.82 0.56  CALCIUM 8.7*  --   --  8.6* 8.5*  GLUCOSE 129*   < > 157* 154* 116*   < > = values in this interval not  displayed.    Imaging/Diagnostic Tests: No results found.   Ezequiel Essex, MD 09/03/2019, 11:50 AM PGY-1, [place] Intern pager: [phone], text pages welcome

## 2019-09-03 NOTE — Progress Notes (Signed)
Chaplain engaged in initial visit with Lisa [name] and her sister-in-law regarding Scientist, physiological.  Chaplain is working to find witnesses to aide in the notarization of Healthcare POA.  Chaplain had in depth conversation with sister-in-law about the process and also the many things Brenya and their family have been enduring. Chaplain offered the ministries of listening and support.    Chaplain will follow-up.

## 2019-09-03 NOTE — Progress Notes (Signed)
Occupational Therapy Treatment Patient Details Name: ANTOINETTA BERRONES MRN: [id number] DOB: [birth year] Today's Date: 09/03/2019    History of present illness Pt is a 69 y/o female admitted secondary to increased abdominal discomfort. Found to have hematuria; workup pending. Pt with recent CVA. PMH includes CVA, HTN and DM.    OT comments  Pt. Was cooperative with therapy. Pt. Worked on ADLs and transfers. Pt. Worked on standing balance and tolerance. Pt. Worked on cognition and is not able to state name or date. Acute OT to follow.   Follow Up Recommendations  Home health OT;Supervision/Assistance - 24 hour;Other (comment)    Equipment Recommendations  None recommended by OT    Recommendations for Other Services      Precautions / Restrictions Precautions Precautions: Fall Restrictions Weight Bearing Restrictions: No       Mobility Bed Mobility Overal bed mobility: Needs Assistance Bed Mobility: Supine to Sit;Sit to Supine     Supine to sit: Supervision Sit to supine: Supervision   General bed mobility comments: assist to manage tubes and lines  Transfers Overall transfer level: Needs assistance Equipment used: None Transfers: Sit to/from Stand Sit to Stand: Supervision Stand pivot transfers: Min guard       General transfer comment: supervision for safety and line management, steady overall with transition    Balance Overall balance assessment: Needs assistance Sitting-balance support: No upper extremity supported;Feet supported Sitting balance-Leahy Scale: Good Sitting balance - Comments: able to don socks without lob   Standing balance support: No upper extremity supported Standing balance-Leahy Scale: Fair Standing balance comment: able to stand at sink s level                           ADL either performed or assessed with clinical judgement   ADL       Grooming: Wash/dry hands;Wash/dry face;Oral care;Brushing hair;Minimal  assistance;Standing           Upper Body Dressing : Supervision/safety;Sitting   Lower Body Dressing: Min guard;Sit to/from stand   Toilet Transfer: Min guard;Ambulation;Regular Toilet;Grab bars   Toileting- Clothing Manipulation and Hygiene: Min guard;Cueing for safety;Cueing for sequencing;Sitting/lateral lean       Functional mobility during ADLs: Min guard;Cueing for sequencing;Cueing for safety General ADL Comments: Pt. was able to perform dressing task in sitting. Pt. ws able to stand for 5 min for grooming at the sink     Vision   Vision Assessment?: No apparent visual deficits   Perception     Praxis      Cognition Arousal/Alertness: Awake/alert Behavior During Therapy: WFL for tasks assessed/performed Overall Cognitive Status: No family/caregiver present to determine baseline cognitive functioning Area of Impairment: Following commands;Memory;Safety/judgement;Problem solving                 Orientation Level: Situation;Disoriented to;Person;Time Current Attention Level: Sustained Memory: Decreased short-term memory Following Commands: Follows one step commands with increased time Safety/Judgement: Decreased awareness of safety;Decreased awareness of deficits Awareness: Emergent Problem Solving: Slow processing;Difficulty sequencing General Comments: Pt. was unable to state name or year.         Exercises     Shoulder Instructions       General Comments      Pertinent Vitals/ Pain       Pain Assessment: No/denies pain  Home Living  Prior Functioning/Environment              Frequency  Min 2X/week        Progress Toward Goals  OT Goals(current goals can now be found in the care plan section)  Progress towards OT goals: Progressing toward goals  Acute Rehab OT Goals Patient Stated Goal: to go home OT Goal Formulation: With patient Time For Goal Achievement:  09/15/19 Potential to Achieve Goals: Good ADL Goals Pt Will Perform Grooming: with set-up;standing Pt Will Perform Lower Body Bathing: with set-up;sit to/from stand Pt Will Perform Lower Body Dressing: with set-up;sit to/from stand Pt Will Transfer to Toilet: with supervision;ambulating;regular height toilet;grab bars Pt Will Perform Toileting - Clothing Manipulation and hygiene: sit to/from stand;with set-up Additional ADL Goal #1: Pt to demonstrate ability to sequence ADLs correctly with <25% verbal cues in order to maximize independence  Plan      Co-evaluation                 AM-PAC OT "6 Clicks" Daily Activity     Outcome Measure   Help from another person eating meals?: A Little Help from another person taking care of personal grooming?: A Little Help from another person toileting, which includes using toliet, bedpan, or urinal?: A Little Help from another person bathing (including washing, rinsing, drying)?: A Little Help from another person to put on and taking off regular upper body clothing?: A Little Help from another person to put on and taking off regular lower body clothing?: A Little 6 Click Score: 18    End of Session Equipment Utilized During Treatment: Gait belt  OT Visit Diagnosis: Unsteadiness on feet (R26.81);Muscle weakness (generalized) (M62.81);Low vision, both eyes (H54.2);Other symptoms and signs involving the nervous system (R29.898);Other symptoms and signs involving cognitive function   Activity Tolerance Patient tolerated treatment well   Patient Left in bed;with call bell/phone within reach;with bed alarm set   Nurse Communication Mobility status;Other (comment)        Time: 7408-1448 OT Time Calculation (min): 36 min  Charges: OT General Charges $OT Visit: 1 Visit OT Treatments $Self Care/Home Management : 23-37 mins  Reece Packer OT/L   Shada Nienaber 09/03/2019, 10:45 AM

## 2019-09-04 DIAGNOSIS — R319 Hematuria, unspecified: Secondary | ICD-10-CM | POA: Diagnosis not present

## 2019-09-04 LAB — GLUCOSE, CAPILLARY
Glucose-Capillary: 123 mg/dL — ABNORMAL HIGH (ref 70–99)
Glucose-Capillary: 111 mg/dL — ABNORMAL HIGH (ref 70–99)
Glucose-Capillary: 170 mg/dL — ABNORMAL HIGH (ref 70–99)
Glucose-Capillary: 140 mg/dL — ABNORMAL HIGH (ref 70–99)

## 2019-09-04 LAB — BASIC METABOLIC PANEL
Anion gap: 11 (ref 5–15)
BUN: <5 mg/dL — ABNORMAL LOW (ref 8–23)
CO2: 26 mmol/L (ref 22–32)
Calcium: 8.5 mg/dL — ABNORMAL LOW (ref 8.9–10.3)
Chloride: 104 mmol/L (ref 98–111)
Creatinine, Ser: 0.44 mg/dL (ref 0.44–1.00)
GFR calc Af Amer: >60 mL/min (ref >60)
GFR calc non Af Amer: >60 mL/min (ref >60)
Glucose, Bld: 132 mg/dL — ABNORMAL HIGH (ref 70–99)
Potassium: 3.1 mmol/L — ABNORMAL LOW (ref 3.5–5.1)
Sodium: 141 mmol/L (ref 135–145)

## 2019-09-04 LAB — CBC
HCT: 26.4 % — ABNORMAL LOW (ref 36.0–46.0)
Hemoglobin: 8.4 g/dL — ABNORMAL LOW (ref 12.0–15.0)
MCH: 28.1 pg (ref 26.0–34.0)
MCHC: 31.8 g/dL (ref 30.0–36.0)
MCV: 88.3 fL (ref 80.0–100.0)
Platelets: 424 10*3/uL — ABNORMAL HIGH (ref 150–400)
RBC: 2.99 MIL/uL — ABNORMAL LOW (ref 3.87–5.11)
RDW: 13.6 % (ref 11.5–15.5)
WBC: 10.5 10*3/uL (ref 4.0–10.5)
nRBC: 0 % (ref 0.0–0.2)

## 2019-09-04 MED ORDER — POTASSIUM CHLORIDE CRYS ER 20 MEQ PO TBCR
40.0000 meq | EXTENDED_RELEASE_TABLET | Freq: Two times a day (BID) | ORAL | Status: AC
  Administered 2019-09-04 (×2): 40 meq via ORAL
  Filled 2019-09-04 (×2): qty 2

## 2019-09-04 MED ORDER — LOSARTAN POTASSIUM 25 MG PO TABS
25.0000 mg | ORAL_TABLET | Freq: Every day | ORAL | Status: DC
  Administered 2019-09-04 – 2019-09-06 (×3): 25 mg via ORAL
  Filled 2019-09-04 (×3): qty 1

## 2019-09-04 NOTE — Progress Notes (Signed)
Met with Ms. Bennion to discuss her pathology results. Her youngest sister in law, Ruth, was present in the room and her older sister in law, [name], was on the phone.  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  We discussed next steps, including urology's recommendation that she follow-up outpatient for the next procedure for staging, a TURBT.  Upon request, I provided them with the name of the urologist, follow-up urology office, and a copy of the pathology result. Questions were answered satisfactorily, and I asked them to not hesitate to reach out with any further questions or concerns.  Ruth followed me outside and shared some concerns she had with Ms. Sachdeva's husband, Russell.  Unfortunately, Russell has some serious health issues of his own, including alcoholism and depression.  Ruth and [name] "committed him to the hospital last night", but he was discharged 12 hours later.  Ruth firmly believes that Russell is presently unable to adequately care for Kuulei's immediate needs. However, when I ask Ms. Reveron about this, she does not seem to have insight into the safety issues present at home for her if Russell is unable to assist her at times.  Upon returning to my office, I received notice that Ms. Faulks's insurance denied the SNF request; we have until Monday morning to complete a peer-to-peer to attempt to explain the situation and ask for approval. I called [name] to let her know, saying we would notify her with the outcome of the peer-to-peer meeting.    Catherine Lynn  

## 2019-09-04 NOTE — TOC Progression Note (Addendum)
Transition of Care [facility]) - Progression Note    Patient Details  Name: CHELCEA ZAHN MRN: [id number] Date of Birth: 08/12/1950  Transition [facility]) CM/SW Contact  Jacalyn Lefevre Edson Snowball, RN Phone Number: 09/04/2019, 11:21 AM  Clinical Narrative:     Provided bed offers to patient and her sister in law Rod Holler at bedside. Explained Medicare.gov list. Also, explained TOC team has submitted for insurance authorization and have not gotten a determination. If Insurance approves SNF if would be about 3 days and then the SNF would have to submit updated clinicals , PT notes etc. Patient and Rod Holler voiced understanding.   Patient stated she would rather just go home with home health. Rod Holler explained to patient that her husband [name] is "not there right now". Patient voiced understanding.   Rod Holler had questions regarding assisted living and private duty sitters. NCM discussed same ,  explained patient and family could arrange. Patient states she wants to go home and pay for private duty sitters.   NCM encouraged patient and Rod Holler to discuss plan. Also to discuss with [name].   NCM will follow up with patient and family later this afternoon.    Followed up with patient and sister in law Rod Holler at bedside. Insurance authorization still pending. Rod Holler prefers patient to go to SNF at discharge if insurance authorizes. Patient stating she wants to go straight home at discharge and hire private sitters.   Director of [facility] came to room to tell patient her husband was at nursing station and wanted to speak to patient. Patient consented to visit with her husband. NCM left room . Will follow up when insurance determination is received. Expected Discharge Plan: [place]    Expected Discharge Plan and Services Expected Discharge Plan: [place]   Discharge Planning Services: CM Consult Post Acute Care Choice: [place] arrangements for the past 2 months: Single  Family Home                 DME Arranged: N/A                     Social Determinants of Health (SDOH) Interventions    Readmission Risk Interventions No flowsheet data found.

## 2019-09-04 NOTE — Progress Notes (Signed)
Family Medicine Teaching Service Daily Progress Note Intern Pager: [phone]  Patient name: Lisa [name] Medical record number: [id number] Date of birth: 11-14-1950 Age: 69 y.o. Gender: female  Primary Care Provider: Patient, No Pcp Per Consultants: Urology Code Status: Full  Pt Overview and Major Events to Date:  Admitted 7/19 7/20 cystoscopy with fulguration and biopsy  Assessment and Plan: Lisa [name] is a 69 year old female presenting with upper abdominal discomfort and gross hematuria, found to be anemic with significant bladder tumor burden.  PMH significant for recent ischemic CVA, recent carotid stent placement, HTN, HLD, DM2.  Bladder tumor  anemia  gross hematuria Hemoglobin 8.4 this morning, improved from 7.8 > 7.7 yesterday.  Patient has been intermittently hypertensive.  Systolic 182-993, diastolic 71-696.  Pulse rate 80s, 97-100%.  Conservative transfusion threshold of 8 due to comorbidities.  Path report showed high-grade papillary carcinoma, invasive. -Per urology, remove Foley and do voiding trial today -We will hold Plavix for 2 more days (until 7/25) due to color of urine -Continue aspirin 325 -SCDs -I's and O's -[facility]patient [facility] with [facility]  AKI 2/2 hypovolemia from gross hematuria: Resolved Creatinine 0.44, down from 0.56. -Continue to monitor  History of recent ischemic CVA  history of recent carotid stent Hospitalization 08/12/2019.  Carotid stent placement 08/20/2019 with Dr. Gwenlyn Saran, vascular surgery.  Has word finding difficulties.  Unsure whether or not patient understands indications for hospitalization and health status versus difficulty with expression. -Still holding Plavix due to color of urine, will restart 7/25 -Continue aspirin 325 -Will follow up with cardiology outpatient for management and possible TTE   Hypertension Patient has been intermittently hypertensive.  Systolic 789-381, diastolic 01-751.  Pulse rate 80s,  97-100%. -Continue metoprolol 25 -Continue amlodipine 10 -Plan to start losartan 25 today, increasing to 50 tomorrow (given tolerance and okay vitals) -Continue monitor BP  Hyperlipidemia -Heart healthy carb modified diet -Continue home atorvastatin 80 daily  DM 2 FS glucose 123 this morning.  -Sensitive sliding scale insulin -We will advise restarting Metformin after discharge at home   FEN/GI: Heart healthy carb modified PPx: SCDs  Disposition: Medically stable, SNF placement when bed available  Subjective:  Lisa [name] was found laying in bed this morning, easily awoken.  She was alert and conversational.  She reported having a little pelvic and lower abdominal discomfort, but saying that it is to be expected after the cystoscopy and biopsy she had the other day.  She has no fever, chills, nausea, vomiting.  No dizziness, lightheadedness, near syncope.  I also discussed with her the results of her pathology report from her bladder biopsy.  She seems to take it in stride.  Did not have any questions for me at the time.  Also did not want me to call anybody like a chaplain for her to speak with.  She said that her sister-in-law [name] would be here this afternoon, so I opted to wait until later this afternoon to speak with [name] and tell her in person.  When I return, I will try to assess the patient's recall and understanding of this diagnosis.  Objective: Temp:  [98 F (36.7 C)-99 F (37.2 C)] 99 F (37.2 C) (07/23 1213) Pulse Rate:  [78-80] 78 (07/23 1213) Resp:  [17-18] 18 (07/23 1213) BP: (122-160)/(76-105) 154/76 (07/23 1213) SpO2:  [97 %-98 %] 97 % (07/23 1213)  Physical Exam: General: Awake, conversational, alert Cardiovascular: Regular rate and rhythm, no murmur auscultated Respiratory: Clear to auscultation bilaterally Abdomen: Slight TTP in suprapubic  region, otherwise nontender.  No rebound tenderness.  Bowel sounds present. Extremities: No BLE  edema  Laboratory: Recent Labs  Lab 09/02/19 1638 09/03/19 0428 09/04/19 0612  WBC 15.8* 12.3* 10.5  HGB 7.8* 7.7* 8.4*  HCT 24.3* 23.8* 26.4*  PLT 373 372 424*   Recent Labs  Lab 09/02/19 0533 09/03/19 0428 09/04/19 0612  NA 140 143 141  K 4.1 3.4* 3.1*  CL 106 110 104  CO2 22 25 26   BUN 14 7* <5*  CREATININE 0.82 0.56 0.44  CALCIUM 8.6* 8.5* 8.5*  GLUCOSE 154* 116* 132*    Imaging/Diagnostic Tests: No results found.   Ezequiel Essex, MD 09/04/2019, 1:59 PM PGY-1, [place] Intern pager: [phone], text pages welcome

## 2019-09-04 NOTE — Progress Notes (Signed)
Pathology reviewed in patient chart.  Patient has follow up at [facility] to discuss restaging TURBT.  Please remove foley this AM.  [facility] with questions/concerns.

## 2019-09-04 NOTE — Progress Notes (Signed)
Chaplain briefly followed up with Lisa [name] regarding Advanced Directive.  Chaplain let her know she was still working to get it notarized.  There are no notaries in the spiritual care department available today.    Chaplain will follow-up on Monday.

## 2019-09-04 NOTE — Progress Notes (Signed)
Late entry. 7/21 afternoon 1.5 hour discussion with Ms. Schmoll and her sister in law, [name]. I spoke with Ms. Coto and [name] together and separately regarding the situation at home and concerns regarding Ms. Prichard's care after discharge. Highlights are provided below.  Conversation with Ms. Nadara Eaton and SIL [name] together: I spoke extensively with both Neoma Laming and [name] regarding disposition.  Johari adamantly wants to go home, possibly with home health and maybe an aide.  She believes her husband, [name], would be able to help her with whatever she needed and provide 24-hour care.  As of right now, [name] needs to obtain equipment for the ground-floor of their house for Libbi, such as a bed and commode.  [name] mentioned that [name] is currently dealing with significant alcohol abuse and depression, including a recent arrest for disorderly conduct in his front yard.  When I asked Kenijah to describe the situation, she says that [name] is a very good man who is devoted to her, and is really working hard on the alcohol abuse.  She feels safe at home and says that [name] has never harmed or threatened to harm her or her children.  [name] did mention that even though [name] is working on this, he still has episodes of incapacitation.  When I ask Mirinda what would happen if she needed help while [name] was incapacitated, she said she was simply do the task herself.  She either did not understand or did not want to tell me that if she relied solely on Russell for 24-hour care, there would likely be periods of neglect, albeit unintentional.  Sharanda was quite unconcerned that [name] may have periods of intoxication and incapacitation.  She very much believes that she can just do it herself.  We also spoke about her power of attorney paperwork, which is started but has yet to be completed.  On principle, I am wary of allowing a patient without capacity currently to sign [facility] POA paperwork.  When I  asked about her progress on the legal paperwork, she said that she began thinking about this after her recent stroke and that she would want [name] her sister-in-law to be her power of attorney.  I gently asked why she would choose [name], explaining that in the absence of a POA, her husband would assume decision-making capabilities if needed.  She did not express any concerns about [name], instead saying that [name] was a sister to her, she trusted her very much, and she had known her for quite some time.  She pointed out that she and [name] have been married for over 40 years, and she has known IT consultant for at least that long as well.  Conversation with SIL [name] alone: When I spoke with [name] alone, she expressed great concern for Kattia's current mental state after her stroke.  [name] adamantly believes Antionetta does not have capacity at this time because she is not reasoning through her decisions and has a lot of trouble remembering certain things since her stroke.  [name] expresses that she also believes her brother [name] is in no position to adequately care for Melany.  While [name] reiterates that [name] is wildly devoted to [employer] and would do anything for her, she believes that he simply may not be able due to current alcohol abuse, depression, and suicidal ideation.  She also says that while he is a very nice man, when he drinks he gets angry sometimes.  He does not deal with Frieda's new health issues well,  and that Francenia several times this admission has asked [name] to have [name] "committed" because she is very concerned he will kill himself.  [name] hopes that Konica will go to a rehab facility for at least a week or so after discharge to allow [name] to be treated inpatient and to get the house better set up for [employer].  Conversation with Ms. Dugal alone: Maritza says that she feels very comfortable having [name] being her power of attorney.  She describes her relationship as being very  positive and longstanding; she feels "like [name] is my sister".  She says [name] has been very supportive over the years and would have her best interests in mind.  She denies feeling unsure, hurried, or coerced in any way.  While she cannot name a best friend or neighbor I could call to help give me context, she does say I could call her youngest sister-in-law Rod Holler ([name]'s sister).   Ezequiel Essex, MD

## 2019-09-04 NOTE — Progress Notes (Addendum)
Proceeded with peer to peer for SNF placement especially given tenuous social situation at home.  Provider briefly stated they would look into proceeding with SNF for short period of time.  Additionally recommended that family and SW start preparing for safe disposition following tentative SNF stay.  Will update SW tomorrow.  Patriciaann Clan, DO

## 2019-09-04 NOTE — Progress Notes (Signed)
Family Medicine Teaching Service Daily Progress Note Intern Pager: [phone]  Patient name: Lisa [name] Medical record number: [id number] Date of birth: [birth year] Age: 69 y.o. Gender: female  Primary Care Provider: Patient, No Pcp Per Consultants: Urology Code Status: FULL  Pt Overview and Major Events to Date:  Admitted 7/19 7/20 cystoscopy with fulguration and biopsy  Assessment and Plan: Ms. Widger is a 69 year old female presenting with upper abdominal discomfort and gross hematuria, found to be anemic with significant bladder tumor burden.  PMH significant for recent ischemic CVA, recent carotid stent placement, HTN, HLD, DM2.  Bladder tumoranemiagross hematuria Hemoglobin 8.9 this morning. Systolic 354-562, diastolic 56-38 past 24 hours.  Pulse rate 67-85, 94-98% on RA.  Conservative transfusion threshold of 8 due to comorbidities. Path report showed high-grade papillary carcinoma, invasive. Voiding trial yesterday with success. Patient states she has voided twice. Denies hematuria, dysuria. No suprapubic pain.  - Voiding trial yesterday with success - Continuing to hold Plavix until 7/25 due to color of urine - Continue aspirin 325 - SCDs - I's and O's -[facility]patient [facility] with [facility]  AKI 2/2 hypovolemia from gross hematuria: Resolved Creatinine 0.57 -Continue to monitor  History of recent ischemic CVAhistory of recent carotid stent Hospitalization 08/12/2019. Carotid stent placement 08/20/2019 with Dr. Gwenlyn Saran, vascular surgery. Has word finding difficulties. Still unsure whether or not patient understands indications for hospitalization and health status versus difficulty with expression. -Still holding Plavix due to color of urine, will restart 7/25 -Continue aspirin 325 -Will follow up with cardiology outpatient for management and possible TTE   Hypertension Patient has been intermittently hypertensive.  Systolic 937-342, diastolic 87-68.   Pulse rate 67-85, 94-98% on RA. -Continue metoprolol 25 mg -Continue amlodipine 10 mg -Plan to increase Losartan to 50 mg today -Continue monitor BP  Hyperlipidemia -Heart healthy carb modified diet -Continue home atorvastatin 80 mg daily  DM 2 FS glucose 135 this morning.  -Sensitive sliding scale insulin -We will advise restarting Metformin after discharge at home   FEN/GI: Heart healthy carb modified PPx: SCDs  Disposition: Medically stable, SNF placement when bed available  Subjective:  Patient is pleasant and understandably sleepy this morning. She tells me that she was able to void twice yesterday. No frank blood that she noticed but states that her urine was "darker" than normal. No suprapubic pain or dysuria. She also had one bowel movement yesterday. Denies any hematochezia. When asked about her understanding of what has been going on her answer is vague. She does not specifically mention her bladder but states that "they may have to do more things to see what is going on". She then proceeded to ask if she can "just get some more sleep", which I respected.   Objective: Temp:  [98 F (36.7 C)-99.2 F (37.3 C)] 99.2 F (37.3 C) (07/23 1722) Pulse Rate:  [78-85] 85 (07/23 1722) Resp:  [18] 18 (07/23 1722) BP: (129-160)/(66-86) 129/66 (07/23 1722) SpO2:  [96 %-97 %] 96 % (07/23 1722) Physical Exam: General: Awake, pleasant, no acute distress Cardiovascular: RRR Respiratory: CTAB Abdomen: no distension, no tenderness to palpation of all quadrants, no suprapubic pain Extremities: trace pitting edema to b/l legs, 2+ PT, no calf tenderness  Laboratory: Recent Labs  Lab 09/02/19 1638 09/03/19 0428 09/04/19 0612  WBC 15.8* 12.3* 10.5  HGB 7.8* 7.7* 8.4*  HCT 24.3* 23.8* 26.4*  PLT 373 372 424*   Recent Labs  Lab 09/02/19 0533 09/03/19 0428 09/04/19 0612  NA 140 143 141  K 4.1 3.4* 3.1*  CL 106 110 104  CO2 22 25 26   BUN 14 7* <5*  CREATININE 0.82 0.56  0.44  CALCIUM 8.6* 8.5* 8.5*  GLUCOSE 154* 116* 132*   Imaging/Diagnostic Tests: Surgical Pathology 7/20 FINAL MICROSCOPIC DIAGNOSIS:  A. BLADDER, TURBT:  - High grade papillary carcinoma, invasive  - Muscularis propria present and uninvolved   US Renal 7/19 IMPRESSION: No hydronephrosis. Heterogeneous echogenicity within the bladder lumen, which may reflect clot or less likely a mass.  Sharion Settler, DO 09/04/2019, 11:40 PM PGY-1, [place] Intern pager: [phone], text pages welcome

## 2019-09-04 NOTE — Social Work (Addendum)
3:46pm- Pt has been denied by [facility] medical director for SNF.  Peer to Peer offered:  [phone] option 5 Must be completed by Monday 7/26 at 10:30EST if MD team wishes to pursue that venue.   This information was sent securely to Dr. Higinio Plan and Dr. Jeani Hawking who have been working with pt/pt family/TOC team.   1:19pm- Pending auth [id number]. Confirmed clinicals received.   Westley Hummer, MSW, [place] Work

## 2019-09-05 DIAGNOSIS — R319 Hematuria, unspecified: Secondary | ICD-10-CM | POA: Diagnosis not present

## 2019-09-05 LAB — GLUCOSE, CAPILLARY
Glucose-Capillary: 143 mg/dL — ABNORMAL HIGH (ref 70–99)
Glucose-Capillary: 157 mg/dL — ABNORMAL HIGH (ref 70–99)
Glucose-Capillary: 141 mg/dL — ABNORMAL HIGH (ref 70–99)
Glucose-Capillary: 135 mg/dL — ABNORMAL HIGH (ref 70–99)

## 2019-09-05 LAB — CBC
HCT: 27.7 % — ABNORMAL LOW (ref 36.0–46.0)
Hemoglobin: 8.9 g/dL — ABNORMAL LOW (ref 12.0–15.0)
MCH: 28.3 pg (ref 26.0–34.0)
MCHC: 32.1 g/dL (ref 30.0–36.0)
MCV: 88.2 fL (ref 80.0–100.0)
Platelets: 422 10*3/uL — ABNORMAL HIGH (ref 150–400)
RBC: 3.14 MIL/uL — ABNORMAL LOW (ref 3.87–5.11)
RDW: 13.7 % (ref 11.5–15.5)
WBC: 10.2 10*3/uL (ref 4.0–10.5)
nRBC: 0 % (ref 0.0–0.2)

## 2019-09-05 LAB — BASIC METABOLIC PANEL
Anion gap: 11 (ref 5–15)
BUN: 7 mg/dL — ABNORMAL LOW (ref 8–23)
CO2: 27 mmol/L (ref 22–32)
Calcium: 8.6 mg/dL — ABNORMAL LOW (ref 8.9–10.3)
Chloride: 104 mmol/L (ref 98–111)
Creatinine, Ser: 0.57 mg/dL (ref 0.44–1.00)
GFR calc Af Amer: >60 mL/min (ref >60)
GFR calc non Af Amer: >60 mL/min (ref >60)
Glucose, Bld: 135 mg/dL — ABNORMAL HIGH (ref 70–99)
Potassium: 4 mmol/L (ref 3.5–5.1)
Sodium: 142 mmol/L (ref 135–145)

## 2019-09-05 NOTE — Progress Notes (Signed)
Per report, patient unable to void during AM shift today, 1900 bladder scanned greater than 500cc, Straight cath greater han 600cc (*please see flow sheet for details). RN paged MD for anticipating nursing orders for bladder concerns. Order received from MD T. Volanda Napoleon.

## 2019-09-05 NOTE — TOC Progression Note (Signed)
Transition of Care [facility]) - Progression Note    Patient Details  Name: SYANNE LOONEY MRN: [id number] Date of Birth: [birth year]  Transition [facility]) CM/SW [place], [place] Phone Number: 09/05/2019, 5:00 PM  Clinical Narrative:    Insurance authorization approved for SNF placement, auth ID [id number]. Good 7/23 thru 7/27. CSW met with patient, sister [name] and sister Rod Holler to provide an update. Patient stated she does not want to go to SNF and would like to return home. Patient's sister [name] expressed patient's husband struggles with drinking and will not be able to assist. Patient again expressed refusal for SNF. CSW left room to allow family to discuss discharge plan, upon returning, everyone agreed on home with Home Health. Patient stated she would private pay for additional care if necessary.  CSW provided MD and RNCM with an update on stated discharge plan. RNCM will assist with discharge needs.    Expected Discharge Plan: [place]    Expected Discharge Plan and Services Expected Discharge Plan: [place]   Discharge Planning Services: CM Consult Post Acute Care Choice: [place] arrangements for the past 2 months: Single Family Home                 DME Arranged: N/A                     Social Determinants of Health (SDOH) Interventions    Readmission Risk Interventions No flowsheet data found.

## 2019-09-06 DIAGNOSIS — R319 Hematuria, unspecified: Secondary | ICD-10-CM | POA: Diagnosis not present

## 2019-09-06 LAB — CBC
HCT: 28 % — ABNORMAL LOW (ref 36.0–46.0)
Hemoglobin: 9 g/dL — ABNORMAL LOW (ref 12.0–15.0)
MCH: 28.9 pg (ref 26.0–34.0)
MCHC: 32.1 g/dL (ref 30.0–36.0)
MCV: 90 fL (ref 80.0–100.0)
Platelets: 408 10*3/uL — ABNORMAL HIGH (ref 150–400)
RBC: 3.11 MIL/uL — ABNORMAL LOW (ref 3.87–5.11)
RDW: 13.9 % (ref 11.5–15.5)
WBC: 12.4 10*3/uL — ABNORMAL HIGH (ref 4.0–10.5)
nRBC: 0 % (ref 0.0–0.2)

## 2019-09-06 LAB — GLUCOSE, CAPILLARY
Glucose-Capillary: 147 mg/dL — ABNORMAL HIGH (ref 70–99)
Glucose-Capillary: 134 mg/dL — ABNORMAL HIGH (ref 70–99)
Glucose-Capillary: 163 mg/dL — ABNORMAL HIGH (ref 70–99)
Glucose-Capillary: 129 mg/dL — ABNORMAL HIGH (ref 70–99)

## 2019-09-06 LAB — BASIC METABOLIC PANEL
Anion gap: 10 (ref 5–15)
BUN: 7 mg/dL — ABNORMAL LOW (ref 8–23)
CO2: 29 mmol/L (ref 22–32)
Calcium: 8.9 mg/dL (ref 8.9–10.3)
Chloride: 104 mmol/L (ref 98–111)
Creatinine, Ser: 0.6 mg/dL (ref 0.44–1.00)
GFR calc Af Amer: >60 mL/min (ref >60)
GFR calc non Af Amer: >60 mL/min (ref >60)
Glucose, Bld: 148 mg/dL — ABNORMAL HIGH (ref 70–99)
Potassium: 3.9 mmol/L (ref 3.5–5.1)
Sodium: 143 mmol/L (ref 135–145)

## 2019-09-06 MED ORDER — LOSARTAN POTASSIUM 50 MG PO TABS
50.0000 mg | ORAL_TABLET | Freq: Every day | ORAL | Status: DC
  Administered 2019-09-07: 50 mg via ORAL
  Filled 2019-09-06: qty 1

## 2019-09-06 MED ORDER — CLOPIDOGREL BISULFATE 75 MG PO TABS
75.0000 mg | ORAL_TABLET | Freq: Every day | ORAL | Status: DC
  Administered 2019-09-06 – 2019-09-07 (×2): 75 mg via ORAL
  Filled 2019-09-06 (×2): qty 1

## 2019-09-06 MED ORDER — TAMSULOSIN HCL 0.4 MG PO CAPS
0.4000 mg | ORAL_CAPSULE | Freq: Every day | ORAL | Status: DC
  Administered 2019-09-06 – 2019-09-07 (×2): 0.4 mg via ORAL
  Filled 2019-09-06 (×2): qty 1

## 2019-09-06 NOTE — Hospital Course (Addendum)
UPDATED 7/25 by Suzy Bouchard  Lisa [name] is a 70 year old female presenting with upper abdominal discomfort and gross hematuria, found to be anemic with significant bladder tumor burden. PMH significant for recent ischemic CVA, recent carotid stent placement, HTN, HLD, Dm2.  7/19 Admitted 7/20 cystoscopy with fulguration and biopsy 7/22 bladder tumor path results: High grade papillary carcinoma, invasive   Bladder tumor  anemia  gross hematuria Admission Hgb 7.9, given 1 unit. Post H&H Hgb 8.8. Urine in foley bag burgundy color until after 7/20 cystoscopy w/ fulguration. Urine color subsequently improved to tea colored.  - conservative transfusion threshold of 8 due to comorbidities - path report showed high-grade papillary carcinoma, invasive - foley catheter removed 7/23 - successful voiding trial 7/24 - will f/u OP with [facility]: next step is a staging TURBT  AKI 2/2 hypovolemia from gross hematuria: Resolved Admission Cr 2.02 (up from 0.56 on 08/20/2019). Cr 2.02 > 1.17 > 0.70 > 0.82 > 0.56 (7/22) - held metformin at admission - considered resolved 7/22 - latest Cr 0.60 (7/25) - restart metformin at OP f/u appt  History of recent ischemic CVA  history of recent carotid stent Hospitalization 08/12/2019.  Carotid stent placement 08/20/2019 with Dr. Gwenlyn Saran, vascular surgery.  Has word finding difficulties.  Unsure whether or not patient understands indications for hospitalization and health status versus difficulty with expression. - held plavix from admission to 7/25 due to color of urine - per vascular surgery, continued aspirin due to such recent CVA - needs to f/u w cards for OP management and possible OP TTE (to find source of embolic stroke)  Hypertension Intermittently hypertensive throughout admission.  - continued home metoprolol 25 - increased amlodipine from 5 to 10 for better BP control - per cards rec, started losartan 25 on 7/23, increased to 50 on  7/24  Hyperlipidemia - continued home atorvastatin 80  DM 2 - held home metformin due to AKI on admission - sensitive sliding scale insulin used for control  - recommend restart metformin at home after OP f/u

## 2019-09-06 NOTE — Progress Notes (Addendum)
   09/06/19 0508  Urine Characteristics  Bladder Scan Volume (mL) 312 mL  Hygiene Peri care   Patient denied any discomfort or urge to void at this time. MD paged regarding assessment above. Awaiting for call back. Dr. Volanda Napoleon returned call. MD advise to only bladder scan if patient c/o discomfort and unable voide. No intervention at this time. Will continue to monitor.

## 2019-09-06 NOTE — Progress Notes (Signed)
   09/06/19 0053  Urine Characteristics  Bladder Scan Volume (mL) 137 mL  Hygiene Peri care   Order to notify MD for 300cc. Patient denied any discomfort or urge to void at this time. Will continue to monitor.

## 2019-09-06 NOTE — Progress Notes (Signed)
Family Medicine Teaching Service Daily Progress Note Intern Pager: [phone]  Patient name: Lisa [name] Medical record number: [id number] Date of birth: 10-29-1950 Age: 69 y.o. Gender: female  Primary Care Provider: Patient, No Pcp Per Consultants: Urology Code Status: FULL  Pt Overview and Major Events to Date:  Admitted 7/19 7/20 cystoscopy with fulguration and biopsy  Assessment and Plan: Lisa [name] is a 69 year old female presenting with upper abdominal discomfort and gross hematuria, found to be anemic with significant bladder tumor burden.  PMH significant for recent ischemic CVA, recent carotid stent placement, HTN, HLD, DM2.  Gross hematuria 2/2 newly diagnosed bladder papillary carcinoma: Stable.  Urine now predominantly yellow, required one I&O cath overnight otherwise spontaneously urinating.  -Will restart Plavix today for recent carotid stent placement -Continue interval bladder scans -Strict I&O's -Follow-up outpatient with [facility]  Acute blood loss anemia, 2/2 above: Improved. Hgb stable at 9, s/p 1 U pRBCs total during stay. -Monitor CBC -Monitor urine  Recent ischemic CVA 6/2021carotid stent placement 7/8: Stable. Mild expressive aphasia remaining and often does not appear to comprehend health status.  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 Present on echocardiogram during stroke work-up last hospitalization, unclear if contributed to recent CVA.  Left AMA prior to TEE and has not been able to complete this outpatient quite yet.  Does appear to have a follow-up appointment on 7/28 for this. -Maintain outpatient follow-up  Hypertension: Chronic, stable. Systolics [id number]. -Continue home metoprolol 25 mg and Norvasc 10 -Increase losartan to 50 mg today -Continue monitor BP  Hyperlipidemia: Chronic, stable. Secondary prevention due  to recent CVA. -Continue home atorvastatin 80 mg daily  DM 2: Chronic, stable. CBGs around 140. -Sensitive sliding scale insulin -Restart home Metformin on DC   FEN/GI: Heart healthy carb modified PPx: SCDs  Disposition:  Medically stable, awaiting discussion with family today to proceed with home with home health services, potentially home today vs tomorrow to allow safe discharge/arrange services outpatient.  Subjective:  Required in and out catheterization at 7 PM last night due to retention.  Did not urinate at all overnight was about to require Foley placement, however spontaneously urinated this morning.  Predominantly yellow with pink-tinged.  Bladder scan afterwards showing approximately 100 cc.  Otherwise she has no concerns this morning.  Objective: Temp:  [98.1 F (36.7 C)-99.1 F (37.3 C)] 98.1 F (36.7 C) (07/25 0450) Pulse Rate:  [75-86] 76 (07/25 0450) Resp:  [16-19] 16 (07/25 0450) BP: (129-157)/(74-80) 156/75 (07/25 0450) SpO2:  [96 %-98 %] 96 % (07/25 0450) Physical Exam: General: Alert, NAD HEENT: NCAT, MMM, surgical incision near left clavicle healing well without any surrounding erythema or swelling Cardiac: RRR, 2/6 systolic murmur present Lungs: Clear bilaterally, no increased WOB  Abdomen: soft, non-tender, non-distended Msk: Moves all extremities spontaneously  Ext: Warm, dry, no edema   Laboratory: Recent Labs  Lab 09/04/19 0612 09/05/19 0402 09/06/19 0317  WBC 10.5 10.2 12.4*  HGB 8.4* 8.9* 9.0*  HCT 26.4* 27.7* 28.0*  PLT 424* 422* 408*   Recent Labs  Lab 09/04/19 0612 09/05/19 0402 09/06/19 0317  NA 141 142 143  K 3.1* 4.0 3.9  CL 104 104 104  CO2 26 27 29   BUN <5* 7* 7*  CREATININE 0.44 0.57 0.60  CALCIUM 8.5* 8.6* 8.9  GLUCOSE 132* 135* 148*   Imaging/Diagnostic Tests: Surgical Pathology 7/20 FINAL MICROSCOPIC DIAGNOSIS:  A. BLADDER, TURBT:  - High  grade papillary carcinoma, invasive  - Muscularis propria present and  uninvolved   US Renal 7/19 IMPRESSION: No hydronephrosis. Heterogeneous echogenicity within the bladder lumen, which may reflect clot or less likely a mass.  Patriciaann Clan, DO 09/06/2019, 8:09 AM PGY-3, [place] Intern pager: [phone], text pages welcome

## 2019-09-06 NOTE — Progress Notes (Signed)
Paged by RN that family was present would like to speak.  Went to bedside.  Discussed care with her sister-in-law, [name].  She reports continued concern that patient cannot take care of herself, patient does not appear to have insight into her medical complexity or ability to follow through with taking medications/scheduling appointments for herself.   While her family had hoped to proceed with SNF, the patient is very adamant against this.  We discussed this with patient at bedside with sister present, which further confirmed this again.  Patient also reports today that she would be okay with her husband being present at home while he seeks outpatient rehab.  Would like to go home tomorrow with home health services.   Will touch base with social work tomorrow morning for home health service assistance and any recommendations for her other sister-in-law, Rod Holler, to contact private home health as well.  Provided supportive listening and reassurance to [facility] that even though she will not be at a facility, home health personnel are trained to provide appropriate care and ensure she would be taking care of in the same manner as here.  Additionally help that her husband will do well with outpatient alcohol rehabilitation and be able to provide care for her.  Patriciaann Clan, DO

## 2019-09-07 DIAGNOSIS — L8990 Pressure ulcer of unspecified site, unspecified stage: Secondary | ICD-10-CM | POA: Insufficient documentation

## 2019-09-07 DIAGNOSIS — R319 Hematuria, unspecified: Secondary | ICD-10-CM | POA: Diagnosis not present

## 2019-09-07 LAB — CBC WITH DIFFERENTIAL/PLATELET
Abs Immature Granulocytes: 0.07 10*3/uL (ref 0.00–0.07)
Basophils Absolute: 0 10*3/uL (ref 0.0–0.1)
Basophils Relative: 0 %
Eosinophils Absolute: 0.5 10*3/uL (ref 0.0–0.5)
Eosinophils Relative: 4 %
HCT: 27.3 % — ABNORMAL LOW (ref 36.0–46.0)
Hemoglobin: 8.6 g/dL — ABNORMAL LOW (ref 12.0–15.0)
Immature Granulocytes: 1 %
Lymphocytes Relative: 19 %
Lymphs Abs: 2.4 10*3/uL (ref 0.7–4.0)
MCH: 28.1 pg (ref 26.0–34.0)
MCHC: 31.5 g/dL (ref 30.0–36.0)
MCV: 89.2 fL (ref 80.0–100.0)
Monocytes Absolute: 0.8 10*3/uL (ref 0.1–1.0)
Monocytes Relative: 7 %
Neutro Abs: 9 10*3/uL — ABNORMAL HIGH (ref 1.7–7.7)
Neutrophils Relative %: 69 %
Platelets: 346 10*3/uL (ref 150–400)
RBC: 3.06 MIL/uL — ABNORMAL LOW (ref 3.87–5.11)
RDW: 13.9 % (ref 11.5–15.5)
WBC: 12.8 10*3/uL — ABNORMAL HIGH (ref 4.0–10.5)
nRBC: 0 % (ref 0.0–0.2)

## 2019-09-07 LAB — BASIC METABOLIC PANEL
Anion gap: 10 (ref 5–15)
BUN: 11 mg/dL (ref 8–23)
CO2: 28 mmol/L (ref 22–32)
Calcium: 8.9 mg/dL (ref 8.9–10.3)
Chloride: 104 mmol/L (ref 98–111)
Creatinine, Ser: 0.64 mg/dL (ref 0.44–1.00)
GFR calc Af Amer: >60 mL/min (ref >60)
GFR calc non Af Amer: >60 mL/min (ref >60)
Glucose, Bld: 144 mg/dL — ABNORMAL HIGH (ref 70–99)
Potassium: 3.7 mmol/L (ref 3.5–5.1)
Sodium: 142 mmol/L (ref 135–145)

## 2019-09-07 LAB — GLUCOSE, CAPILLARY
Glucose-Capillary: 145 mg/dL — ABNORMAL HIGH (ref 70–99)
Glucose-Capillary: 148 mg/dL — ABNORMAL HIGH (ref 70–99)

## 2019-09-07 MED ORDER — ASPIRIN 81 MG PO TBEC: 81.0000 mg | DELAYED_RELEASE_TABLET | Freq: Every day | ORAL | 0 refills | Status: DC

## 2019-09-07 MED ORDER — ASPIRIN EC 81 MG PO TBEC: 81.0000 mg | DELAYED_RELEASE_TABLET | Freq: Every day | ORAL | Status: DC

## 2019-09-07 MED ORDER — LOSARTAN POTASSIUM 25 MG PO TABS
25.0000 mg | ORAL_TABLET | Freq: Every day | ORAL | 0 refills | Status: DC
Start: 2019-09-07 — End: 2019-10-03

## 2019-09-07 MED ORDER — AMLODIPINE BESYLATE 10 MG PO TABS: 10.0000 mg | ORAL_TABLET | Freq: Every day | ORAL | 0 refills | Status: DC

## 2019-09-07 NOTE — TOC Progression Note (Addendum)
Transition of Care [facility]) - Progression Note    Patient Details  Name: Lisa [name] MRN: [id number] Date of Birth: 04-02-1950  [facility] CM/SW Contact  Jacalyn Lefevre Edson Snowball, RN Phone Number: 09/07/2019, 10:49 AM  Clinical Narrative:     Patient wanting to return to home at discharge.   Spoke to patient's sister in law Rod Holler.   HHPT,OT,SP arranged with [facility] ( was active prior to admission ). Spoke with Cassie at [facility].   Spoke to patient's sister in law Rod Holler regarding private duty sitter/aide at home. Patient and family would have to arrnge this . See note from last week. NCM does not have a list of private duty agencies. NCM provided a few names, did not recommend one. Explained Rod Holler can do an IT consultant .   Explained a 3 in 1 was ordered and what a 3 in 1 is. This will be delivered to patient's room prior to discharge.   Rod Holler asking about a hospital bed for home. NCM explained PT/OT did not recommend a hospital bed but if patient and family want a hospital bed , NCM will ask MD for order. If ordered [place] would check to see if insurance would cover.   Rod Holler voiced understanding to all of above. Rod Holler will check with [name] to see if they want a hospital bed. Patient's bedroom at home is on second level and family has discussed putting a bed in the den at first. Rod Holler will discuss with [name] and decide if they want a regular bed or a hospital bed.   Rod Holler stated patient's husband will be picking patient up to take her home.   NCM will await decision on bed.   Dr Higinio Plan aware of all of above.   Expected Discharge Plan: [place]    Expected Discharge Plan and Services Expected Discharge Plan: [place]   Discharge Planning Services: CM Consult Post Acute Care Choice: Home Health, Durable Medical Equipment Living arrangements for the past 2 months: Single Family Home                 DME Arranged:  3-N-1 DME Agency: [employer] Date DME Agency Contacted: 09/07/19 Time DME Agency Contacted: 0900 Representative spoke with at DME Agency: [facility] HH Arranged: PT, OT, Speech Therapy [place] Date [place]: 09/07/19 Time [place]: 1049 Representative spoke with at [place]: Cassie   Social Determinants of Health ([place]) Interventions    Readmission Risk Interventions No flowsheet data found.

## 2019-09-07 NOTE — Progress Notes (Signed)
Physical Therapy Treatment Patient Details Name: Lisa [name] MRN: [id number] DOB: 03-Dec-1950 Today's Date: 09/07/2019    History of Present Illness Pt is a 69 y/o female admitted secondary to increased abdominal discomfort. Found to have hematuria; workup pending. Pt with recent CVA. PMH includes CVA, HTN and DM.     PT Comments    Continuing work on functional mobility and activity tolerance;  Session focused on progressive amb; overall steady with uncomplicated amb, smooth gait pattern;  Initiated higher level balance challenges, which were more difficult for Ms. Neilan, and she needed minguard assist; Walked with cane versus no device, and the cane did not impart any benefit; noted possible dc today  Follow Up Recommendations  Home health PT;Supervision/Assistance - 24 hour (Highly rec Outpt PT for balance after HH course)     Equipment Recommendations  None recommended by PT    Recommendations for Other Services       Precautions / Restrictions Precautions Precautions: Fall Restrictions Weight Bearing Restrictions: No    Mobility  Bed Mobility Overal bed mobility: Needs Assistance Bed Mobility: Supine to Sit;Sit to Supine     Supine to sit: Supervision Sit to supine: Modified independent (Device/Increase time)   General bed mobility comments: Supervision for safety due to quick movements  Transfers Overall transfer level: Needs assistance Equipment used: None Transfers: Sit to/from Stand Sit to Stand: Supervision         General transfer comment: Supervision for safety, and encouragement to participate  Ambulation/Gait Ambulation/Gait assistance: Supervision Gait Distance (Feet): 300 Feet (x2) Assistive device: None;Straight cane Gait Pattern/deviations: Step-through pattern;Decreased stride length;Drifts right/left     General Gait Details: pt with a quick pace once in hallway, able to maneuver around obstacles safely, no LOB or need for physical  assistance, min guard for safety; attempted to use cane, and it did not provide much help with straightforward walking   Stairs         General stair comments: pt declined stairs despite encouragement   Wheelchair Mobility    Modified Rankin (Stroke Patients Only)       Balance Overall balance assessment: Needs assistance Sitting-balance support: No upper extremity supported;Feet supported Sitting balance-Leahy Scale: Good Sitting balance - Comments: able to don socks without lob   Standing balance support: No upper extremity supported Standing balance-Leahy Scale: Good Standing balance comment: able to stand at sink without assistance              High level balance activites: Backward walking;Braiding High Level Balance Comments: Backward walking not comfortable for pt and she took quick steps, and needed min guard for safety; initiated braiding, pt uniniterested in practicing            Cognition Arousal/Alertness: Awake/alert Behavior During Therapy: WFL for tasks assessed/performed Overall Cognitive Status: No family/caregiver present to determine baseline cognitive functioning Area of Impairment: Following commands;Memory;Safety/judgement;Problem solving                   Current Attention Level: Sustained Memory: Decreased short-term memory Following Commands: Follows one step commands with increased time Safety/Judgement: Decreased awareness of safety;Decreased awareness of deficits Awareness: Emergent Problem Solving: Slow processing;Difficulty sequencing General Comments: Lots of encouragement to participate today      Exercises      General Comments General comments (skin integrity, edema, etc.): Pt with abrupt ending to OT session due to request to "just rest a bit in bed". Pt with difficulty reading ADL item labels out loud and  identify ADL items      Pertinent Vitals/Pain Pain Assessment: No/denies pain Faces Pain Scale: No hurt     Home Living                      Prior Function            PT Goals (current goals can now be found in the care plan section) Acute Rehab PT Goals Patient Stated Goal: to go home PT Goal Formulation: With patient Time For Goal Achievement: 09/14/19 Potential to Achieve Goals: Good Progress towards PT goals: Progressing toward goals    Frequency    Min 3X/week      PT Plan Current plan remains appropriate    Co-evaluation              AM-PAC PT "6 Clicks" Mobility   Outcome Measure  Help needed turning from your back to your side while in a flat bed without using bedrails?: None Help needed moving from lying on your back to sitting on the side of a flat bed without using bedrails?: None Help needed moving to and from a bed to a chair (including a wheelchair)?: None Help needed standing up from a chair using your arms (e.g., wheelchair or bedside chair)?: None Help needed to walk in hospital room?: None Help needed climbing 3-5 steps with a railing? : A Little 6 Click Score: 23    End of Session Equipment Utilized During Treatment: Gait belt Activity Tolerance: Patient tolerated treatment well Patient left: in bed;with call bell/phone within reach;with bed alarm set Nurse Communication: Mobility status PT Visit Diagnosis: Unsteadiness on feet (R26.81);Difficulty in walking, not elsewhere classified (R26.2)     Time: 8478-4128 PT Time Calculation (min) (ACUTE ONLY): 18 min  Charges:  $Gait Training: 8-22 mins                     Roney [place], [place]  Acute Rehabilitation Services Pager [phone] Office [phone]    Colletta [place] 09/07/2019, 9:52 AM

## 2019-09-07 NOTE — Progress Notes (Signed)
AVS reviewed with patient/family. All questions answered at this time. Transportation provided by family.  

## 2019-09-07 NOTE — Progress Notes (Addendum)
Occupational Therapy Treatment Patient Details Name: Lisa [name] MRN: [id number] DOB: 1950/05/07 Today's Date: 09/07/2019    History of present illness Pt is a 69 y/o female admitted secondary to increased abdominal discomfort. Found to have hematuria; workup pending. Pt with recent CVA. PMH includes CVA, HTN and DM.    OT comments  Pt demonstrating improved stability with mobility in the room without AD. Pt able to complete grooming tasks standing at sink for 5 minutes, but continues to require intermittent cues for sequencing as pt inquired "what do I do with this now?" while holding toothbrush. (With time, pt able to report using electric toothbrush at home rather than manual, so this may have also lead to confusion.) Assessed ability to identify ADL items with continued difficulty noted, but pt observed to implement compensatory strategies to assist in identifying objects though still unable to verbalize.    Follow Up Recommendations  Home health OT;Supervision/Assistance - 24 hour;Other (comment) (SNF if 24/7 supervision not available)    Equipment Recommendations  None recommended by OT    Recommendations for Other Services      Precautions / Restrictions Precautions Precautions: Fall Restrictions Weight Bearing Restrictions: No       Mobility Bed Mobility Overal bed mobility: Needs Assistance Bed Mobility: Supine to Sit;Sit to Supine     Supine to sit: Supervision Sit to supine: Modified independent (Device/Increase time)   General bed mobility comments: Supervision for safety due to quick movements  Transfers                      Balance Overall balance assessment: Needs assistance Sitting-balance support: No upper extremity supported;Feet supported Sitting balance-Leahy Scale: Good Sitting balance - Comments: able to don socks without lob   Standing balance support: No upper extremity supported Standing balance-Leahy Scale: Fair Standing balance  comment: able to stand at sink without assistance                            ADL either performed or assessed with clinical judgement   ADL Overall ADL's : Needs assistance/impaired     Grooming: Supervision/safety;Standing;Wash/dry hands;Wash/dry face;Oral care Grooming Details (indicate cue type and reason): Supervision standing at sink due to need for intermittent cueing for sequencing             Lower Body Dressing: Set up Lower Body Dressing Details (indicate cue type and reason): Setup to don socks sitting EOB             Functional mobility during ADLs: Supervision/safety General ADL Comments: Pt with improved stability walking in room without AD, no physical assist needed. Pt with continued difficulty sequencing tasks though improving     Vision   Vision Assessment?: No apparent visual deficits   Perception     Praxis      Cognition Arousal/Alertness: Awake/alert Behavior During Therapy: WFL for tasks assessed/performed Overall Cognitive Status: No family/caregiver present to determine baseline cognitive functioning Area of Impairment: Following commands;Memory;Safety/judgement;Problem solving                   Current Attention Level: Sustained Memory: Decreased short-term memory Following Commands: Follows one step commands with increased time Safety/Judgement: Decreased awareness of safety;Decreased awareness of deficits Awareness: Emergent Problem Solving: Slow processing;Difficulty sequencing General Comments: Pt with continued difficulty stating name and birthdate. Assessed ability to name ADL items with pt able to name 0/4, but demo compensatory strategies of  describing the use of the items        Exercises     Shoulder Instructions       General Comments Pt with abrupt ending to OT session due to request to "just rest a bit in bed". Pt with difficulty reading ADL item labels out loud and identify ADL items    Pertinent  Vitals/ Pain       Pain Assessment: No/denies pain Faces Pain Scale: No hurt  Home Living                                          Prior Functioning/Environment              Frequency  Min 2X/week        Progress Toward Goals  OT Goals(current goals can now be found in the care plan section)  Progress towards OT goals: Progressing toward goals  Acute Rehab OT Goals Patient Stated Goal: to go home OT Goal Formulation: With patient Time For Goal Achievement: 09/15/19 Potential to Achieve Goals: Good ADL Goals Pt Will Perform Grooming: with set-up;standing Pt Will Perform Lower Body Bathing: with set-up;sit to/from stand Pt Will Perform Lower Body Dressing: with set-up;sit to/from stand Pt Will Transfer to Toilet: with supervision;ambulating;regular height toilet;grab bars Pt Will Perform Toileting - Clothing Manipulation and hygiene: sit to/from stand;with set-up Additional ADL Goal #1: Pt to demonstrate ability to sequence ADLs correctly with <25% verbal cues in order to maximize independence  Plan Discharge plan remains appropriate    Co-evaluation                 AM-PAC OT "6 Clicks" Daily Activity     Outcome Measure   Help from another person eating meals?: None Help from another person taking care of personal grooming?: A Little Help from another person toileting, which includes using toliet, bedpan, or urinal?: A Little Help from another person bathing (including washing, rinsing, drying)?: A Little Help from another person to put on and taking off regular upper body clothing?: A Little Help from another person to put on and taking off regular lower body clothing?: A Little 6 Click Score: 19    End of Session Equipment Utilized During Treatment: Gait belt  OT Visit Diagnosis: Unsteadiness on feet (R26.81);Muscle weakness (generalized) (M62.81);Low vision, both eyes (H54.2);Other symptoms and signs involving the nervous system  (R29.898);Other symptoms and signs involving cognitive function   Activity Tolerance Patient tolerated treatment well   Patient Left in bed;with call bell/phone within reach;with bed alarm set   Nurse Communication Mobility status        Time: 1610-9604 OT Time Calculation (min): 16 min  Charges: OT General Charges $OT Visit: 1 Visit OT Treatments $Self Care/Home Management : 8-22 mins  Layla Maw, OTR/L   Layla Maw 09/07/2019, 9:26 AM

## 2019-09-07 NOTE — Discharge Summary (Addendum)
[facility] Discharge Summary  Patient name: Lisa [name] Medical record number: [id number] Date of birth: 1950/08/28 Age: 69 y.o. 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  Discharge Diagnoses/Problem List:  Papillary Carcinoma of Bladder AKI History of recent CVA HTN HLD T2DM   Disposition: Home with home health services   Discharge Condition: Stable  Discharge Exam:  Temp:  [98.2 F (36.8 C)-98.8 F (37.1 C)] 98.6 F (37 C) (07/26 0616) Pulse Rate:  [77-88] 88 (07/26 0616) Resp:  [18] 18 (07/26 0616) BP: (117-159)/(63-81) 117/63 (07/26 0616) SpO2:  [96 %-99 %] 96 % (07/26 0616) Physical Exam: General: Awake, alert, pleasant Cardiovascular: RRR, no murmurs Respiratory: CTAB Abdomen: Soft, non-tender in all quadrants, non-distended Extremities: No edema, 2+PT, radial pulses. Right food third digit with dried blood on nail bed appears to be a chipped nail, patient states she has had this "for years"  Brief Hospital Course:  Lisa [name] is a 69 year old female presenting with upper abdominal discomfort and gross hematuria, found to be anemic with significant bladder tumor burden. PMH significant for recent ischemic CVA, recent carotid stent placement, HTN, HLD, Dm2.  Bladder tumor  anemia  gross hematuria Admission Hgb 7.9, given 1 unit. Post H&H Hgb 8.8 and remained stable for the duration of her stay.  Urology consulted, urine in foley bag burgundy color until after 7/20 cystoscopy w/ fulguration. Urine color subsequently improved to tea colored.  - Only required 1U PRBC's total - path report showed high-grade papillary carcinoma, invasive - foley catheter removed 7/23 - successful voiding trial 7/24 - will f/u OP [facility]  [facility]: next step is a staging TURBT, already has urology follow-up scheduled for the beginning of August 2021  AKI 2/2 hypovolemia from gross hematuria: Resolved Admission Cr 2.02 (up from 0.56 on 08/20/2019).Resolved to 0.64 on discharge. - held metformin at admission - resolved by 7/22 after fluid resuscitation - restart metformin at OP f/u appt  History of recent ischemic CVA  history of recent carotid stent Hospitalization 08/12/2019.  Carotid stent placement 08/20/2019 with Dr. Gwenlyn Saran, vascular surgery.  Has word finding difficulties.  Unsure whether or not patient understands indications for hospitalization and health status vs difficulty with expression.  Certainly needs help with medication assistance and attending scheduled medical appointments, sister-in-law very devoted to help establish home health assistance for patient at discharge. - held plavix from admission, but restarted on 7/25 after urine clearing - per vascular surgery, continued aspirin entire stay due to such recent CVA - needs to f/u w cards for OP management and possible OP TEE (due to abnormal valve that could have been contributor to CVA)  Hypertension- stable Intermittently hypertensive throughout admission. BP on discharge: 117/58 - continued home metoprolol 25 - increased amlodipine from 5 to 10 for better BP control - Started losartan 25 on 7/23  Issues for Follow Up:  1. Monitor BP.  Titrating medications as above, please make sure BP well controlled especially with recent CVA (see previous dc summary)  2. Recheck CBC. Hemoglobin on discharge of 8.6. Received 1 total unit PRBC's.  3. If continued incontinence, could check UA to r/o UTI.  4. Follow up with cardiology for TEE, has appointment on 7/28. 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  5. Has follow up with Urology 8/6, will likely need  restaging with TURBT.  Significant Procedures:  Transurethral resection of bladder tumor (TURBT)  -7/20 Cystoscopy with clot evaluation -7/20 Bladder tumor path results: High grade papillary carcinoma, invasive - 7/22  Significant Labs and Imaging:  Recent Labs  Lab 09/05/19 0402 09/06/19 0317 09/07/19 0301  WBC 10.2 12.4* 12.8*  HGB 8.9* 9.0* 8.6*  HCT 27.7* 28.0* 27.3*  PLT 422* 408* 346   Recent Labs  Lab 09/02/19 0533 09/02/19 0533 09/03/19 0428 09/03/19 0428 09/04/19 0612 09/04/19 0612 09/05/19 0402 09/05/19 0402 09/06/19 0317 09/07/19 0301  NA 140   < > 143  --  141  --  142  --  143 142  K 4.1   < > 3.4*   < > 3.1*   < > 4.0   < > 3.9 3.7  CL 106   < > 110  --  104  --  104  --  104 104  CO2 22   < > 25  --  26  --  27  --  29 28  GLUCOSE 154*   < > 116*  --  132*  --  135*  --  148* 144*  BUN 14   < > 7*  --  <5*  --  7*  --  7* 11  CREATININE 0.82   < > 0.56  --  0.44  --  0.57  --  0.60 0.64  CALCIUM 8.6*   < > 8.5*  --  8.5*  --  8.6*  --  8.9 8.9  MG 1.5*  --   --   --   --   --   --   --   --   --    < > = values in this interval not displayed.   Results/Tests Pending at Time of Discharge: None  Discharge Medications:  Allergies as of 09/07/2019   No Known Allergies     Medication List    STOP taking these medications   HYDROcodone-acetaminophen 5-325 MG tablet Commonly known as: NORCO/VICODIN     TAKE these medications   amLODipine 10 MG tablet Commonly known as: NORVASC Take 1 tablet (10 mg total) by mouth daily. Start taking on: September 08, 2019 What changed:   medication strength  how much to take   aspirin 81 MG EC tablet Take 1 tablet (81 mg total) by mouth daily. Swallow whole. Start taking on: September 08, 2019 What changed:   medication strength  how much to take  additional instructions   atorvastatin 80 MG tablet Commonly known as: LIPITOR Take 1 tablet (80 mg total) by mouth daily.   clopidogrel 75 MG tablet Commonly known as: PLAVIX Take 1 tablet (75 mg total) by mouth daily.   losartan 25 MG tablet Commonly  known as: Cozaar Take 1 tablet (25 mg total) by mouth daily. What changed:   medication strength  how much to take   metFORMIN 500 MG 24 hr tablet Commonly known as: Glucophage XR Take 1 tablet (500 mg total) by mouth daily with breakfast.   metoprolol tartrate 25 MG tablet Commonly known as: LOPRESSOR Take 1 tablet (25 mg total) by mouth 2 (two) times daily.   MULTIPLE VITAMIN PO Take 1 tablet by mouth daily.            Durable Medical Equipment  (From admission, onward)         Start     Ordered   09/07/19 0753  For home use only DME  3 n 1  Once        09/07/19 0755   09/02/19 1613  For home use only DME 3 n 1  Once        09/02/19 1612          Discharge Instructions: Please refer to Patient Instructions section of EMR for full details.  Patient was counseled important signs and symptoms that should prompt return to medical care, changes in medications, dietary instructions, activity restrictions, and follow up appointments.   Follow-Up Appointments:  Follow-up Information    [facility] On 09/17/2019.   Why: 08:30 Dr. Oretha Ellis information: [place]       [facility]. Schedule an appointment as soon as possible for a visit.   Specialty: Neurology Why: for stroke follow up.  Contact information: [address] [place], [place], DO 09/07/2019, 1:47 PM PGY-1, [place] Upper-Level Resident Addendum My edits for correction/addition/clarification are added Please see also any attending notes.    Patriciaann Clan, DO  Family Medicine PGY-3

## 2019-09-07 NOTE — Discharge Instructions (Signed)
.   Dear Lisa [name],   Thank you so much for allowing [place] to be part of your care!  You were admitted to [facility] for blood in your urine and were found to have a bladder tumor.  You were seen by urology while you were here, who irrigated out your bladder.  Additionally, you are seen by physical therapy, Occupational Therapy, and speech who were also helping after your recent stroke.  You will continue to see them outpatient to help with your language and motor skills.   POST-HOSPITAL & CARE INSTRUCTIONS 1. It is of the utmost importance that you follow-up with all your scheduled visits documented within this paperwork including cardiology, establish care with new PCP, urology, and neurology. 2. Please trial 'depends' briefs to help catch urine if you are not able to get to the restroom yet. 3. Make sure that you are taking your medications as prescribed and drink plenty of water to urinate on a regular basis. 4. Please let PCP/Specialists know of any changes that were made.  5. Please see medications section of this packet for any medication changes.   DOCTOR'S APPOINTMENT & FOLLOW UP CARE INSTRUCTIONS  Future Appointments  Date Time Provider [place]  09/09/2019  8:35 AM [facility]-CV CH ECHO 5 [id number]  09/11/2019  3:00 PM Delorse Limber [facility] PEC  09/25/2019  9:00 AM MC-CV HS VASC 5 - JP MC-HCVI VVS  09/25/2019 10:00 AM Waynetta Sandy, MD [facility] VVS  10/14/2019  8:40 AM Donato Heinz, MD [facility]    RETURN PRECAUTIONS: If you are noticing the color of your urine is extremely red again like previous, please stop the Plavix and call the urologist office or PCP.  If you are feeling significantly lightheaded/dizzy with more blood loss, please come to the ED.   Take care and be well!  [facility]  [place], [place]  [address] [phone]

## 2019-09-07 NOTE — Evaluation (Addendum)
Speech Language Pathology Evaluation Patient Details Name: Lisa [name] MRN: [id number] DOB: [birth year] Today's Date: 09/07/2019 Time: 4008-6761 SLP Time Calculation (min) (ACUTE ONLY): 30 min  Problem List:  Patient Active Problem List   Diagnosis Date Noted  . Gross hematuria 09/01/2019  . Hematuria 08/31/2019  . 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 Stenosis of carotid artery   . Abnormality of aortic valve   . DM (diabetes mellitus), type 2, uncontrolled w/neurologic complication ([place])   . Hypercholesteremia   . CVA (cerebral vascular accident) ([place]) 08/12/2019  . Acute ischemic stroke ([place])   . Hypertension    Past Medical History:  Past Medical History:  Diagnosis Date  . Carotid arterial disease ([place]) 07/2019  . Diabetes ([place])   . HTN (hypertension)   . Hyperlipidemia   . Stroke [facility])    acute/subacute left MCA infarct 08/12/19   Past Surgical History:  Past Surgical History:  Procedure Laterality Date  . CYSTOSCOPY WITH FULGERATION N/A 09/01/2019   Procedure: CYSTOSCOPY CLOT EVACUATION WITH POSSIBLE FULGERATION;  Surgeon: Robley Fries, MD;  Location: [place];  Service: Urology;  Laterality: N/A;  . TONSILLECTOMY    . TRANSCAROTID ARTERY REVASCULARIZATION Left 08/20/2019   Procedure: LEFT TRANSCAROTID ARTERY REVASCULARIZATION;  Surgeon: Waynetta Sandy, MD;  Location: [facility] OR;  Service: Vascular;  Laterality: Left;   HPI:  69yo female admitted 08/31/19 with chest discomfort, hematuria, and upper abdominal discomfort. Anemic PMH: Left CVA (08/12/19), carotid stent placement, HTN, HLD, DM2. Pt seen by ST July 1 - Broca's aphasia with nonfluent phrases, short sentences, anomia   Assessment / Plan / Recommendation Clinical Impression  Pt continues to present with receptive and expressive language impairment, consistent with Broca's aphasia. Receptively, pt is able to answer yes/no questions accurately. She  follows 1- and some 2-step commands. Expressively, she exhibits phrase length utterances, but was unable to repeat words or phrases. She could produce automatic sequences (counting, DOW, MOY) with 80% accuracy after being given first 1-2 items (numbers, days, months). She named common objects with ~50% accuracy. Pt frequently responds with "its in there, I just can't get it out", and "its all jumbled up in there". Frequent neologisms noted today. Recommend continued ST intervention targeting word retrieval to maximize effective communication and reduce caregiver burden.    SLP Assessment  SLP Recommendation/Assessment: Patient needs continued Speech Language Pathology Services SLP Visit Diagnosis: Aphasia (R47.01)    Follow Up Recommendations  Outpatient or Home health ST   Frequency and Duration min 2x/week  2 weeks      SLP Evaluation Cognition  Overall Cognitive Status: No family/caregiver present to determine baseline cognitive functioning Arousal/Alertness: Awake/alert  Unable to assess cognition due to significant impairment of receptive and expressive language     Comprehension  Auditory Comprehension Overall Auditory Comprehension: Impaired at baseline Yes/No Questions: Within Functional Limits Commands: Impaired One Step Basic Commands: 75-100% accurate Two Step Basic Commands: 50-74% accurate Conversation: Simple EffectiveTechniques: Extra processing time Visual Recognition/Discrimination Discrimination: Not tested Reading Comprehension Reading Status: Not tested    Expression Expression Primary Mode of Expression: Verbal Verbal Expression Overall Verbal Expression: Impaired at baseline Initiation: No impairment Level of Generative/Spontaneous Verbalization: Phrase Repetition: Impaired Level of Impairment: Word level Naming: Impairment Verbal Errors: Phonemic paraphasias;Aware of errors;Neologisms Pragmatics: No impairment Non-Verbal Means of Communication:  Gestures Written Expression Dominant Hand: Left Written Expression: Not tested   Oral / Motor  Oral Motor/Sensory Function Overall Oral Motor/Sensory Function: Mild  impairment Facial ROM: Reduced right Facial Symmetry: Abnormal symmetry right Facial Strength: Reduced right Facial Sensation: Reduced right Lingual ROM: Within Functional Limits Lingual Symmetry: Within Functional Limits Lingual Strength: Within Functional Limits Lingual Sensation: Within Functional Limits Mandible: Within Functional Limits Motor Speech Overall Motor Speech: Appears within functional limits for tasks assessed Respiration: Within functional limits Phonation: Normal Articulation: Within functional limitis Intelligibility: Intelligible Motor Planning: Impaired Level of Impairment: Phrase Motor Speech Errors: Inconsistent   GO                   Celia B. Quentin Ore, [facility], [place] Speech Language Pathologist Office: [phone]  Shonna Chock 09/07/2019, 12:07 PM

## 2019-09-07 NOTE — Progress Notes (Addendum)
Family Medicine Teaching Service Daily Progress Note Intern Pager: [phone]  Patient name: Lisa [name] Medical record number: [id number] Date of birth: June 09, 1950 Age: 69 y.o. Gender: female  Primary Care Provider: Patient, No Pcp Per Consultants: Urology Code Status: FULL  Pt Overview and Major Events to Date:  Admitted 7/19 7/20 Cystoscopy with fulguration and biopsy  Assessment and Plan: Ms. Boehning a 69 year old female presenting with upper abdominal discomfort and gross hematuria, found to be anemic with significant bladder tumor burden.PMH significant for recent ischemic CVA, recent carotid stent placement, HTN, HLD, DM2.  Gross hematuria 2/2 newly diagnosed bladder papillary carcinoma: Stable.  Patient denies hematuria, unsure if reliable. No pain. - Plavix restarted yesterday - Strict I&O's - [facility]-up outpatient [facility]  Urine Incontinence Patient is not reliable historian. Nurse states patient has been incontinent of urine and when she goes, "its a lot". Likely that this is more a cognitive issue. One recorded episode of incontinence at 0600 this AM. - Monitor, likely will need to use Depends as outpatient  Acute blood loss anemia, 2/2 above: Improved. Hgb stable at 8.6, s/p 1 U pRBCs total during stay. - Monitor CBC - Monitor urine  Recent ischemic CVA 6/2021carotid stent placement 7/8: Stable. Mild expressive aphasia remaining and often does not appear to comprehend health status.  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 Present on echocardiogram during stroke work-up last hospitalization, unclear if contributed to recent CVA.  Left AMA prior to TEE and has not been able to complete this outpatient quite yet.  Does appear to have a follow-up appointment on 7/28 for this. - Maintain outpatient follow-up  Hypertension: Chronic,  stable. Systolics 329'J, improved to 110-120s with increase in Losartan to 50 yesterday. - Continue home metoprolol 25 mg, Norvasc 10, Losartan 50 - Continue monitor BP  Hyperlipidemia: Chronic, stable. Secondary prevention due to recent CVA. - Continue home atorvastatin 80 mg daily  DM 2: Chronic, stable. CBGs around 120-140. - Sensitive sliding scale insulin - Restart home Metformin on DC  FEN/GI:Heart healthy carb modified JOA:CZYS /clopidogrel   Disposition: Medically stable. Home to home health   Subjective:  Patient is pleasant and has no complaints this morning. States she is able to urinate without difficulty and denies any episodes of incontinence. Denies hematuria or pain. She still would like to go home with home health. States her family may be by to see her today.   Objective: Temp:  [98.2 F (36.8 C)-98.8 F (37.1 C)] 98.6 F (37 C) (07/26 0616) Pulse Rate:  [77-88] 88 (07/26 0616) Resp:  [18] 18 (07/26 0616) BP: (117-159)/(63-81) 117/63 (07/26 0616) SpO2:  [96 %-99 %] 96 % (07/26 0616) Physical Exam: General: Awake, alert, pleasant Cardiovascular: RRR, no murmurs Respiratory: CTAB Abdomen: Soft, non-tender in all quadrants, non-distended Extremities: No edema, 2+PT, radial pulses. Right food third digit with dried blood on nail bed appears to be a chipped nail, patient states she has had this "for years"  Laboratory: Recent Labs  Lab 09/05/19 0402 09/06/19 0317 09/07/19 0301  WBC 10.2 12.4* 12.8*  HGB 8.9* 9.0* 8.6*  HCT 27.7* 28.0* 27.3*  PLT 422* 408* 346   Recent Labs  Lab 09/04/19 0612 09/05/19 0402 09/06/19 0317  NA 141 142 143  K 3.1* 4.0 3.9  CL 104 104 104  CO2 26 27 29   BUN <5* 7* 7*  CREATININE 0.44 0.57 0.60  CALCIUM 8.5*  8.6* 8.9  GLUCOSE 132* 135* 148*    Imaging/Diagnostic Tests: Surgical Pathology 7/20 FINAL MICROSCOPIC DIAGNOSIS:  A. BLADDER, TURBT:  - High grade papillary carcinoma, invasive  - Muscularis  propria present and uninvolved   US Renal 7/19 IMPRESSION: No hydronephrosis. Heterogeneous echogenicity within the bladder lumen, which may reflect clot or less likely a mass.  Sharion Settler, DO 09/07/2019, 6:28 AM PGY-1, [place] Intern pager: [phone], text pages welcome

## 2019-09-08 DIAGNOSIS — I6522 Occlusion and stenosis of left carotid artery: Secondary | ICD-10-CM | POA: Diagnosis not present

## 2019-09-08 DIAGNOSIS — I63232 Cerebral infarction due to unspecified occlusion or stenosis of left carotid arteries: Secondary | ICD-10-CM | POA: Diagnosis not present

## 2019-09-08 DIAGNOSIS — Z8673 Personal history of transient ischemic attack (TIA), and cerebral infarction without residual deficits: Secondary | ICD-10-CM | POA: Diagnosis not present

## 2019-09-08 DIAGNOSIS — E119 Type 2 diabetes mellitus without complications: Secondary | ICD-10-CM | POA: Diagnosis not present

## 2019-09-08 DIAGNOSIS — Z48812 Encounter for surgical aftercare following surgery on the circulatory system: Secondary | ICD-10-CM | POA: Diagnosis not present

## 2019-09-08 DIAGNOSIS — Z7984 Long term (current) use of oral hypoglycemic drugs: Secondary | ICD-10-CM | POA: Diagnosis not present

## 2019-09-08 DIAGNOSIS — Z7902 Long term (current) use of antithrombotics/antiplatelets: Secondary | ICD-10-CM | POA: Diagnosis not present

## 2019-09-08 DIAGNOSIS — Z95818 Presence of other cardiac implants and grafts: Secondary | ICD-10-CM | POA: Diagnosis not present

## 2019-09-08 DIAGNOSIS — I10 Essential (primary) hypertension: Secondary | ICD-10-CM | POA: Diagnosis not present

## 2019-09-08 DIAGNOSIS — E1149 Type 2 diabetes mellitus with other diabetic neurological complication: Secondary | ICD-10-CM | POA: Diagnosis not present

## 2019-09-09 ENCOUNTER — Encounter (HOSPITAL_COMMUNITY): Payer: Self-pay | Admitting: *Deleted

## 2019-09-09 ENCOUNTER — Encounter: Payer: Self-pay | Admitting: *Deleted

## 2019-09-09 ENCOUNTER — Telehealth: Payer: Self-pay | Admitting: *Deleted

## 2019-09-09 ENCOUNTER — Other Ambulatory Visit: Payer: Self-pay

## 2019-09-09 ENCOUNTER — Ambulatory Visit (HOSPITAL_COMMUNITY): Payer: Medicare Other

## 2019-09-09 ENCOUNTER — Encounter (HOSPITAL_COMMUNITY): Payer: Self-pay

## 2019-09-09 NOTE — Progress Notes (Signed)
I don't understand what happened.  I thought Suanne Marker ordered a TEE and scheduled it for 7/12.  Suanne Marker, did you order a TTE instead? Why was the TEE cancelled on 7/12?

## 2019-09-09 NOTE — Telephone Encounter (Signed)
Returned call to [employer] note.

## 2019-09-09 NOTE — Progress Notes (Unsigned)
I spoke with [facility], she looked into it and saw that the ordering team did not order a COVID test for TEE on 7/12, so it was cancelled because of no COVID test.  In addition it seems that a TTE was ordered instead of a TEE, so TTE was scheduled for today.  I will call patient to apologize and see if she is willing to proceed with TEE.

## 2019-09-09 NOTE — Telephone Encounter (Signed)
This encounter was created in error - please disregard.

## 2019-09-09 NOTE — Progress Notes (Unsigned)
Patient ID: Lisa [name], female   DOB: 05/29/1950, 69 y.o.   MRN: [id number]   Lisa [name] arrived for her echocardiogram 09-09-19.  While trying to understand why another TTE had been ordered (one completed 08-13-19 during a hospital visit) a situation arose.  There is a quite confusing history of exams, procedures, and orders (outlined below to my knowledge)  which I was attempting to work through so the proper test would be performed.  With my trying to understand, the Patient and her husband became very agitated, stated they would speak with their physician, and then left the office.    After extensively reading the office/hospital notes, this is the timeline of events as I understand it...  08-12-19 Pt. Has CVA, admitted to hospital  08-13-19 Inpatient ECHO (TTE) performed.  Possible Mobile Density seen on AO Valve.  TEE requested (never ordered?)  08-14-19 Patient leaves hospital AMA without having performed inpt. TEE.  Vascular Surgery contacts patient, decision made to proceed with Carotid Revascularization without having TEE, however pre-op consult with cardiology scheduled 08-19-19 to confirm this decision  08-19-19 Patient see's [place] in office.  Confirmed, ok to proceed with Transcarotid Revascularization, however recommended Transesophageal Echo (TEE) to follow up on echo findings from 08-13-19 TEE scheduled for 08-24-19   ***however no TEE order placed-  TTE order placed? *(TTE scheduled from this order for 09-09-19)   08-20-19 Transcarotid Revascularization performed by Dr. Donzetta Matters.  08-21-19 Patient Discharged- (Instructions follow up with Carotid US and visit with Dr. Donzetta Matters in 4 wks.)  08-24-19 TEE scheduled (from 7-7 office visit, NO ORDER?) Cancelled (Dr. Harrington Challenger?)  Patient arrives for TEE, informed it is cancelled?  ** (no procedure note, assuming it was "cancelled" because there is no official order?)   09-09-19 Patient arrives for scheduled TTE (ordered 08-19-19)   Upon research by me, this does not appear to be the correct exam, however patient leaves before I am able to discuss and understand the situation.     Obviously there is confusion and frustration (rightfully so) surrounding these events.  I am hoping to clear this up, so Lisa [name] can be contacted, APOLOGIZED TO, and then have the correct test.  This has been a really unfortunate series of events and I just want to make sure she is taken care of.  Please advise...  Deliah Boston, RDCS

## 2019-09-09 NOTE — Progress Notes (Signed)
I looked and cannot find the order. However, I knew she had an echo (which is ordered as an echo, not as a TTE). So I don't see any reason I would have ordered an echo, I recall that I knew she needed a TEE.  Sorry about the confusion, it may be that we just needed to make sure that the orders were appropriate for an outpt TEE, instead of inpt.

## 2019-09-09 NOTE — Progress Notes (Unsigned)
Called OR scheduling and Endo-spoke to the nurse in endo to try to clarify reason for TEE on 7/12 being cancelled.  Nurse states on Monday morning she reviews the cases and charts and saw patient was discharged (from her surgery with Dr. Donzetta Matters) on 7/9.  She states TEE was listed as inpatient so she reviewed D/C note and no mention of TEE.  Since patient was no longer an admission, she thought TEE was no longer needed and case was cancelled. Patient also did not have a covid test prior to this which would of been an issue as well since discharged from hospital.    No one was notified as they thought this was an inpatient order and patient no longer admission.     Received call back from endo nurse-she states she was the charge nurse the day patient was scheduled.  She states patient and boyfriend showed up to hospital for TEE and were told it was cancelled.   She was reviewing the chart and speaking to [place] (with [facility]) to try to clarify and see if patient still needed TEE.   She was made aware boyfriend was upset, irate and "yelling" at admitting staff.   Informed boyfriend threw water bottle as well and security had to be called.   Patient and boyfriend requested to leave campus.       Echocardiogram ordered 7/7 at appt with Rosaria Ferries PA (this was scheduled for 7/28).

## 2019-09-09 NOTE — Telephone Encounter (Signed)
New Message  [place] from [place] is calling in to speak with [facility] about patient. Please give Theadora Rama a call back.

## 2019-09-09 NOTE — Progress Notes (Signed)
According to past appts screen, there was a TEE ordered.

## 2019-09-10 ENCOUNTER — Other Ambulatory Visit: Payer: Self-pay | Admitting: Family Medicine

## 2019-09-10 DIAGNOSIS — Z7902 Long term (current) use of antithrombotics/antiplatelets: Secondary | ICD-10-CM | POA: Diagnosis not present

## 2019-09-10 DIAGNOSIS — Z95818 Presence of other cardiac implants and grafts: Secondary | ICD-10-CM | POA: Diagnosis not present

## 2019-09-10 DIAGNOSIS — I63232 Cerebral infarction due to unspecified occlusion or stenosis of left carotid arteries: Secondary | ICD-10-CM | POA: Diagnosis not present

## 2019-09-10 DIAGNOSIS — E1149 Type 2 diabetes mellitus with other diabetic neurological complication: Secondary | ICD-10-CM | POA: Diagnosis not present

## 2019-09-10 DIAGNOSIS — E119 Type 2 diabetes mellitus without complications: Secondary | ICD-10-CM | POA: Diagnosis not present

## 2019-09-10 DIAGNOSIS — I6522 Occlusion and stenosis of left carotid artery: Secondary | ICD-10-CM | POA: Diagnosis not present

## 2019-09-10 DIAGNOSIS — I10 Essential (primary) hypertension: Secondary | ICD-10-CM | POA: Diagnosis not present

## 2019-09-10 DIAGNOSIS — Z8673 Personal history of transient ischemic attack (TIA), and cerebral infarction without residual deficits: Secondary | ICD-10-CM | POA: Diagnosis not present

## 2019-09-10 DIAGNOSIS — Z7984 Long term (current) use of oral hypoglycemic drugs: Secondary | ICD-10-CM | POA: Diagnosis not present

## 2019-09-10 DIAGNOSIS — Z48812 Encounter for surgical aftercare following surgery on the circulatory system: Secondary | ICD-10-CM | POA: Diagnosis not present

## 2019-09-11 ENCOUNTER — Other Ambulatory Visit: Payer: Self-pay

## 2019-09-11 ENCOUNTER — Ambulatory Visit: Payer: Medicare Other | Admitting: Physician Assistant

## 2019-09-11 DIAGNOSIS — I63232 Cerebral infarction due to unspecified occlusion or stenosis of left carotid arteries: Secondary | ICD-10-CM | POA: Diagnosis not present

## 2019-09-11 DIAGNOSIS — Z95818 Presence of other cardiac implants and grafts: Secondary | ICD-10-CM | POA: Diagnosis not present

## 2019-09-11 DIAGNOSIS — Z7984 Long term (current) use of oral hypoglycemic drugs: Secondary | ICD-10-CM | POA: Diagnosis not present

## 2019-09-11 DIAGNOSIS — I10 Essential (primary) hypertension: Secondary | ICD-10-CM | POA: Diagnosis not present

## 2019-09-11 DIAGNOSIS — I6522 Occlusion and stenosis of left carotid artery: Secondary | ICD-10-CM | POA: Diagnosis not present

## 2019-09-11 DIAGNOSIS — Z8673 Personal history of transient ischemic attack (TIA), and cerebral infarction without residual deficits: Secondary | ICD-10-CM | POA: Diagnosis not present

## 2019-09-11 DIAGNOSIS — I6521 Occlusion and stenosis of right carotid artery: Secondary | ICD-10-CM

## 2019-09-11 DIAGNOSIS — E1149 Type 2 diabetes mellitus with other diabetic neurological complication: Secondary | ICD-10-CM | POA: Diagnosis not present

## 2019-09-11 DIAGNOSIS — E119 Type 2 diabetes mellitus without complications: Secondary | ICD-10-CM | POA: Diagnosis not present

## 2019-09-11 DIAGNOSIS — Z7902 Long term (current) use of antithrombotics/antiplatelets: Secondary | ICD-10-CM | POA: Diagnosis not present

## 2019-09-11 DIAGNOSIS — Z48812 Encounter for surgical aftercare following surgery on the circulatory system: Secondary | ICD-10-CM | POA: Diagnosis not present

## 2019-09-15 DIAGNOSIS — Z48812 Encounter for surgical aftercare following surgery on the circulatory system: Secondary | ICD-10-CM | POA: Diagnosis not present

## 2019-09-15 DIAGNOSIS — Z95818 Presence of other cardiac implants and grafts: Secondary | ICD-10-CM | POA: Diagnosis not present

## 2019-09-15 DIAGNOSIS — Z8673 Personal history of transient ischemic attack (TIA), and cerebral infarction without residual deficits: Secondary | ICD-10-CM | POA: Diagnosis not present

## 2019-09-15 DIAGNOSIS — I6522 Occlusion and stenosis of left carotid artery: Secondary | ICD-10-CM | POA: Diagnosis not present

## 2019-09-15 DIAGNOSIS — Z7902 Long term (current) use of antithrombotics/antiplatelets: Secondary | ICD-10-CM | POA: Diagnosis not present

## 2019-09-15 DIAGNOSIS — E119 Type 2 diabetes mellitus without complications: Secondary | ICD-10-CM | POA: Diagnosis not present

## 2019-09-15 DIAGNOSIS — I10 Essential (primary) hypertension: Secondary | ICD-10-CM | POA: Diagnosis not present

## 2019-09-15 DIAGNOSIS — E1149 Type 2 diabetes mellitus with other diabetic neurological complication: Secondary | ICD-10-CM | POA: Diagnosis not present

## 2019-09-15 DIAGNOSIS — Z7984 Long term (current) use of oral hypoglycemic drugs: Secondary | ICD-10-CM | POA: Diagnosis not present

## 2019-09-15 DIAGNOSIS — I63232 Cerebral infarction due to unspecified occlusion or stenosis of left carotid arteries: Secondary | ICD-10-CM | POA: Diagnosis not present

## 2019-09-16 ENCOUNTER — Telehealth: Payer: Self-pay | Admitting: General Practice

## 2019-09-16 ENCOUNTER — Ambulatory Visit: Payer: Medicare Other | Admitting: Physician Assistant

## 2019-09-16 DIAGNOSIS — Z8673 Personal history of transient ischemic attack (TIA), and cerebral infarction without residual deficits: Secondary | ICD-10-CM | POA: Diagnosis not present

## 2019-09-16 DIAGNOSIS — Z7984 Long term (current) use of oral hypoglycemic drugs: Secondary | ICD-10-CM | POA: Diagnosis not present

## 2019-09-16 DIAGNOSIS — I63232 Cerebral infarction due to unspecified occlusion or stenosis of left carotid arteries: Secondary | ICD-10-CM | POA: Diagnosis not present

## 2019-09-16 DIAGNOSIS — E119 Type 2 diabetes mellitus without complications: Secondary | ICD-10-CM | POA: Diagnosis not present

## 2019-09-16 DIAGNOSIS — Z48812 Encounter for surgical aftercare following surgery on the circulatory system: Secondary | ICD-10-CM | POA: Diagnosis not present

## 2019-09-16 DIAGNOSIS — E1149 Type 2 diabetes mellitus with other diabetic neurological complication: Secondary | ICD-10-CM | POA: Diagnosis not present

## 2019-09-16 DIAGNOSIS — I10 Essential (primary) hypertension: Secondary | ICD-10-CM | POA: Diagnosis not present

## 2019-09-16 DIAGNOSIS — Z95818 Presence of other cardiac implants and grafts: Secondary | ICD-10-CM | POA: Diagnosis not present

## 2019-09-16 DIAGNOSIS — Z7902 Long term (current) use of antithrombotics/antiplatelets: Secondary | ICD-10-CM | POA: Diagnosis not present

## 2019-09-16 DIAGNOSIS — I6522 Occlusion and stenosis of left carotid artery: Secondary | ICD-10-CM | POA: Diagnosis not present

## 2019-09-16 NOTE — Progress Notes (Signed)
Spoke with patient's husband, apologized for what happened with her TEE being canceled and did recommend that we reschedule her TEE.  He states that right now that their focus is really on her new diagnosis of bladder cancer, for which they are seeing a specialist tomorrow.  States that they will have more information at that time.  They are going to talk things over and call us back at the end of the week or early next week to discuss scheduling TEE.

## 2019-09-16 NOTE — Telephone Encounter (Signed)
Patient's spouse called to cancel todays appointment - Patient has bladder cancer and was in extreme pain today.  They are in contact with her cancer specialist for the pain.  Husband states that he will call back to reschedule.

## 2019-09-16 NOTE — Telephone Encounter (Signed)
I am sorry she is having a rough time. If she was hurting though it would have been a good time for her to come in so we could help manage things while she awaits Urology appt. This is the second time they have canceled day of for her NP appt. We will happily reschedule but if it becomes a true pattern of last minute cancellation or No-shows then we will not be able to continue reschedule.

## 2019-09-17 ENCOUNTER — Emergency Department (HOSPITAL_COMMUNITY): Payer: Medicare Other

## 2019-09-17 ENCOUNTER — Inpatient Hospital Stay (HOSPITAL_COMMUNITY)
Admission: EM | Admit: 2019-09-17 | Discharge: 2019-10-03 | DRG: 668 | Disposition: A | Discharge status: Skilled Nursing Facility | Payer: Medicare Other | Attending: Family Medicine | Admitting: Family Medicine

## 2019-09-17 ENCOUNTER — Encounter (HOSPITAL_COMMUNITY): Payer: Self-pay | Admitting: *Deleted

## 2019-09-17 DIAGNOSIS — N3289 Other specified disorders of bladder: Secondary | ICD-10-CM | POA: Diagnosis present

## 2019-09-17 DIAGNOSIS — Z515 Encounter for palliative care: Secondary | ICD-10-CM | POA: Diagnosis not present

## 2019-09-17 DIAGNOSIS — Z743 Need for continuous supervision: Secondary | ICD-10-CM | POA: Diagnosis not present

## 2019-09-17 DIAGNOSIS — R310 Gross hematuria: Secondary | ICD-10-CM

## 2019-09-17 DIAGNOSIS — E119 Type 2 diabetes mellitus without complications: Secondary | ICD-10-CM | POA: Diagnosis not present

## 2019-09-17 DIAGNOSIS — N029 Recurrent and persistent hematuria with unspecified morphologic changes: Secondary | ICD-10-CM | POA: Diagnosis not present

## 2019-09-17 DIAGNOSIS — I639 Cerebral infarction, unspecified: Secondary | ICD-10-CM | POA: Diagnosis present

## 2019-09-17 DIAGNOSIS — N200 Calculus of kidney: Secondary | ICD-10-CM | POA: Diagnosis not present

## 2019-09-17 DIAGNOSIS — F1910 Other psychoactive substance abuse, uncomplicated: Secondary | ICD-10-CM | POA: Diagnosis present

## 2019-09-17 DIAGNOSIS — E861 Hypovolemia: Secondary | ICD-10-CM | POA: Diagnosis not present

## 2019-09-17 DIAGNOSIS — Z23 Encounter for immunization: Secondary | ICD-10-CM | POA: Diagnosis not present

## 2019-09-17 DIAGNOSIS — D62 Acute posthemorrhagic anemia: Secondary | ICD-10-CM | POA: Diagnosis not present

## 2019-09-17 DIAGNOSIS — Z8249 Family history of ischemic heart disease and other diseases of the circulatory system: Secondary | ICD-10-CM

## 2019-09-17 DIAGNOSIS — E860 Dehydration: Secondary | ICD-10-CM | POA: Diagnosis not present

## 2019-09-17 DIAGNOSIS — I82432 Acute embolism and thrombosis of left popliteal vein: Secondary | ICD-10-CM | POA: Diagnosis not present

## 2019-09-17 DIAGNOSIS — I82413 Acute embolism and thrombosis of femoral vein, bilateral: Secondary | ICD-10-CM | POA: Diagnosis not present

## 2019-09-17 DIAGNOSIS — I779 Disorder of arteries and arterioles, unspecified: Secondary | ICD-10-CM | POA: Diagnosis present

## 2019-09-17 DIAGNOSIS — F17210 Nicotine dependence, cigarettes, uncomplicated: Secondary | ICD-10-CM | POA: Diagnosis present

## 2019-09-17 DIAGNOSIS — N179 Acute kidney failure, unspecified: Secondary | ICD-10-CM | POA: Diagnosis present

## 2019-09-17 DIAGNOSIS — I69320 Aphasia following cerebral infarction: Secondary | ICD-10-CM

## 2019-09-17 DIAGNOSIS — E1165 Type 2 diabetes mellitus with hyperglycemia: Secondary | ICD-10-CM | POA: Diagnosis not present

## 2019-09-17 DIAGNOSIS — E1159 Type 2 diabetes mellitus with other circulatory complications: Secondary | ICD-10-CM | POA: Diagnosis present

## 2019-09-17 DIAGNOSIS — R319 Hematuria, unspecified: Secondary | ICD-10-CM | POA: Diagnosis not present

## 2019-09-17 DIAGNOSIS — Z7189 Other specified counseling: Secondary | ICD-10-CM

## 2019-09-17 DIAGNOSIS — R509 Fever, unspecified: Secondary | ICD-10-CM | POA: Diagnosis not present

## 2019-09-17 DIAGNOSIS — Z801 Family history of malignant neoplasm of trachea, bronchus and lung: Secondary | ICD-10-CM

## 2019-09-17 DIAGNOSIS — Z7401 Bed confinement status: Secondary | ICD-10-CM | POA: Diagnosis not present

## 2019-09-17 DIAGNOSIS — I82441 Acute embolism and thrombosis of right tibial vein: Secondary | ICD-10-CM | POA: Diagnosis not present

## 2019-09-17 DIAGNOSIS — I63532 Cerebral infarction due to unspecified occlusion or stenosis of left posterior cerebral artery: Secondary | ICD-10-CM | POA: Diagnosis not present

## 2019-09-17 DIAGNOSIS — R29705 NIHSS score 5: Secondary | ICD-10-CM | POA: Diagnosis present

## 2019-09-17 DIAGNOSIS — E876 Hypokalemia: Secondary | ICD-10-CM | POA: Diagnosis present

## 2019-09-17 DIAGNOSIS — I63512 Cerebral infarction due to unspecified occlusion or stenosis of left middle cerebral artery: Secondary | ICD-10-CM | POA: Diagnosis not present

## 2019-09-17 DIAGNOSIS — I824Y3 Acute embolism and thrombosis of unspecified deep veins of proximal lower extremity, bilateral: Secondary | ICD-10-CM | POA: Diagnosis not present

## 2019-09-17 DIAGNOSIS — I6381 Other cerebral infarction due to occlusion or stenosis of small artery: Secondary | ICD-10-CM | POA: Diagnosis not present

## 2019-09-17 DIAGNOSIS — E1151 Type 2 diabetes mellitus with diabetic peripheral angiopathy without gangrene: Secondary | ICD-10-CM | POA: Diagnosis present

## 2019-09-17 DIAGNOSIS — R4701 Aphasia: Secondary | ICD-10-CM

## 2019-09-17 DIAGNOSIS — R531 Weakness: Secondary | ICD-10-CM | POA: Diagnosis not present

## 2019-09-17 DIAGNOSIS — I10 Essential (primary) hypertension: Secondary | ICD-10-CM | POA: Diagnosis not present

## 2019-09-17 DIAGNOSIS — E7800 Pure hypercholesterolemia, unspecified: Secondary | ICD-10-CM | POA: Diagnosis not present

## 2019-09-17 DIAGNOSIS — R0602 Shortness of breath: Secondary | ICD-10-CM | POA: Diagnosis not present

## 2019-09-17 DIAGNOSIS — Z20822 Contact with and (suspected) exposure to covid-19: Secondary | ICD-10-CM | POA: Diagnosis present

## 2019-09-17 DIAGNOSIS — R300 Dysuria: Secondary | ICD-10-CM | POA: Diagnosis not present

## 2019-09-17 DIAGNOSIS — R410 Disorientation, unspecified: Secondary | ICD-10-CM | POA: Diagnosis not present

## 2019-09-17 DIAGNOSIS — Z95828 Presence of other vascular implants and grafts: Secondary | ICD-10-CM | POA: Diagnosis not present

## 2019-09-17 DIAGNOSIS — K5730 Diverticulosis of large intestine without perforation or abscess without bleeding: Secondary | ICD-10-CM | POA: Diagnosis not present

## 2019-09-17 DIAGNOSIS — M2550 Pain in unspecified joint: Secondary | ICD-10-CM | POA: Diagnosis not present

## 2019-09-17 DIAGNOSIS — Z79899 Other long term (current) drug therapy: Secondary | ICD-10-CM

## 2019-09-17 DIAGNOSIS — E669 Obesity, unspecified: Secondary | ICD-10-CM | POA: Diagnosis present

## 2019-09-17 DIAGNOSIS — Z7902 Long term (current) use of antithrombotics/antiplatelets: Secondary | ICD-10-CM

## 2019-09-17 DIAGNOSIS — I69311 Memory deficit following cerebral infarction: Secondary | ICD-10-CM

## 2019-09-17 DIAGNOSIS — Z7982 Long term (current) use of aspirin: Secondary | ICD-10-CM | POA: Diagnosis not present

## 2019-09-17 DIAGNOSIS — I82403 Acute embolism and thrombosis of unspecified deep veins of lower extremity, bilateral: Secondary | ICD-10-CM | POA: Diagnosis not present

## 2019-09-17 DIAGNOSIS — M6281 Muscle weakness (generalized): Secondary | ICD-10-CM | POA: Diagnosis not present

## 2019-09-17 DIAGNOSIS — R338 Other retention of urine: Secondary | ICD-10-CM | POA: Diagnosis not present

## 2019-09-17 DIAGNOSIS — R2681 Unsteadiness on feet: Secondary | ICD-10-CM | POA: Diagnosis not present

## 2019-09-17 DIAGNOSIS — Z7984 Long term (current) use of oral hypoglycemic drugs: Secondary | ICD-10-CM

## 2019-09-17 DIAGNOSIS — E1149 Type 2 diabetes mellitus with other diabetic neurological complication: Secondary | ICD-10-CM | POA: Diagnosis present

## 2019-09-17 DIAGNOSIS — C679 Malignant neoplasm of bladder, unspecified: Secondary | ICD-10-CM | POA: Diagnosis not present

## 2019-09-17 DIAGNOSIS — R2689 Other abnormalities of gait and mobility: Secondary | ICD-10-CM | POA: Diagnosis not present

## 2019-09-17 DIAGNOSIS — Z8673 Personal history of transient ischemic attack (TIA), and cerebral infarction without residual deficits: Secondary | ICD-10-CM | POA: Diagnosis not present

## 2019-09-17 DIAGNOSIS — I6522 Occlusion and stenosis of left carotid artery: Secondary | ICD-10-CM | POA: Diagnosis not present

## 2019-09-17 DIAGNOSIS — E785 Hyperlipidemia, unspecified: Secondary | ICD-10-CM | POA: Diagnosis present

## 2019-09-17 DIAGNOSIS — R000 Tachycardia, unspecified: Secondary | ICD-10-CM | POA: Diagnosis not present

## 2019-09-17 DIAGNOSIS — R404 Transient alteration of awareness: Secondary | ICD-10-CM | POA: Diagnosis not present

## 2019-09-17 DIAGNOSIS — Z7289 Other problems related to lifestyle: Secondary | ICD-10-CM

## 2019-09-17 DIAGNOSIS — I6389 Other cerebral infarction: Secondary | ICD-10-CM | POA: Diagnosis not present

## 2019-09-17 DIAGNOSIS — K8020 Calculus of gallbladder without cholecystitis without obstruction: Secondary | ICD-10-CM | POA: Diagnosis not present

## 2019-09-17 DIAGNOSIS — I69392 Facial weakness following cerebral infarction: Secondary | ICD-10-CM | POA: Diagnosis not present

## 2019-09-17 DIAGNOSIS — I6521 Occlusion and stenosis of right carotid artery: Secondary | ICD-10-CM | POA: Diagnosis not present

## 2019-09-17 DIAGNOSIS — D649 Anemia, unspecified: Secondary | ICD-10-CM | POA: Diagnosis not present

## 2019-09-17 DIAGNOSIS — Z6830 Body mass index (BMI) 30.0-30.9, adult: Secondary | ICD-10-CM

## 2019-09-17 DIAGNOSIS — I6529 Occlusion and stenosis of unspecified carotid artery: Secondary | ICD-10-CM | POA: Diagnosis present

## 2019-09-17 DIAGNOSIS — R339 Retention of urine, unspecified: Secondary | ICD-10-CM | POA: Diagnosis present

## 2019-09-17 DIAGNOSIS — I6523 Occlusion and stenosis of bilateral carotid arteries: Secondary | ICD-10-CM | POA: Diagnosis present

## 2019-09-17 DIAGNOSIS — C678 Malignant neoplasm of overlapping sites of bladder: Secondary | ICD-10-CM | POA: Diagnosis not present

## 2019-09-17 LAB — CBC
HCT: 22.2 % — ABNORMAL LOW (ref 36.0–46.0)
Hemoglobin: 6.9 g/dL — CL (ref 12.0–15.0)
MCH: 27.6 pg (ref 26.0–34.0)
MCHC: 31.1 g/dL (ref 30.0–36.0)
MCV: 88.8 fL (ref 80.0–100.0)
Platelets: 488 10*3/uL — ABNORMAL HIGH (ref 150–400)
RBC: 2.5 MIL/uL — ABNORMAL LOW (ref 3.87–5.11)
RDW: 14.3 % (ref 11.5–15.5)
WBC: 13.5 10*3/uL — ABNORMAL HIGH (ref 4.0–10.5)
nRBC: 0 % (ref 0.0–0.2)

## 2019-09-17 LAB — HEMOGLOBIN AND HEMATOCRIT, BLOOD
HCT: 22.2 % — ABNORMAL LOW (ref 36.0–46.0)
Hemoglobin: 7 g/dL — ABNORMAL LOW (ref 12.0–15.0)

## 2019-09-17 LAB — BASIC METABOLIC PANEL
Anion gap: 15 (ref 5–15)
BUN: 16 mg/dL (ref 8–23)
CO2: 25 mmol/L (ref 22–32)
Calcium: 8.8 mg/dL — ABNORMAL LOW (ref 8.9–10.3)
Chloride: 97 mmol/L — ABNORMAL LOW (ref 98–111)
Creatinine, Ser: 1.67 mg/dL — ABNORMAL HIGH (ref 0.44–1.00)
GFR calc Af Amer: 37 mL/min — ABNORMAL LOW (ref >60)
GFR calc non Af Amer: 32 mL/min — ABNORMAL LOW (ref >60)
Glucose, Bld: 224 mg/dL — ABNORMAL HIGH (ref 70–99)
Potassium: 3.4 mmol/L — ABNORMAL LOW (ref 3.5–5.1)
Sodium: 137 mmol/L (ref 135–145)

## 2019-09-17 LAB — GLUCOSE, CAPILLARY: Glucose-Capillary: 136 mg/dL — ABNORMAL HIGH (ref 70–99)

## 2019-09-17 LAB — PREPARE RBC (CROSSMATCH)

## 2019-09-17 LAB — SARS CORONAVIRUS 2 BY RT PCR (HOSPITAL ORDER, PERFORMED IN ~~LOC~~ HOSPITAL LAB): SARS Coronavirus 2: NEGATIVE

## 2019-09-17 LAB — POC OCCULT BLOOD, ED: Fecal Occult Bld: POSITIVE — AB

## 2019-09-17 IMAGING — CT CT ABD-PELV W/ CM
3 of 6 series · 16 of 46 positions shown, 18 images · IV contrast (omnipaque)
Comparison: None.

CLINICAL DATA: Left lower quadrant pain, hematuria

EXAM:
CT ABDOMEN AND PELVIS WITH CONTRAST
TECHNIQUE: Multidetector CT imaging of the abdomen and pelvis was performed
using the standard protocol following bolus administration of
intravenous contrast.
CONTRAST:  75mL OMNIPAQUE IOHEXOL 300 MG/ML  SOLN

[Series 3: abdomen 5.0 · axial · 0.88mm/px · z∈[+754,+1114]mm · 9 of 92 slices shown, 11 images]
[im 10/92  soft-tissue]
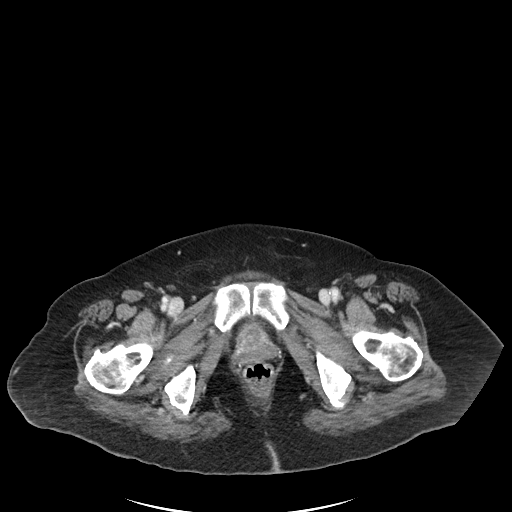
[im 10/92  bone]
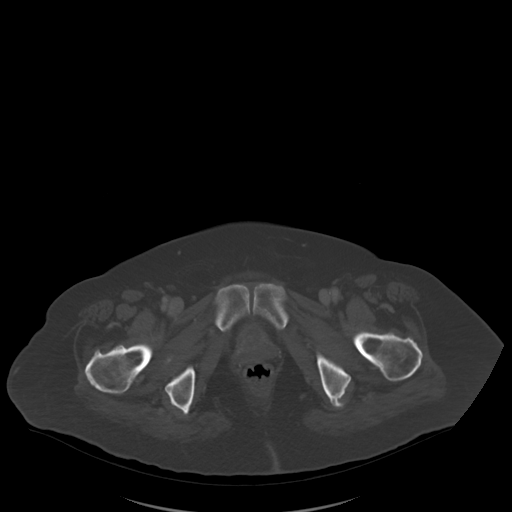
[im 19/92  soft-tissue]
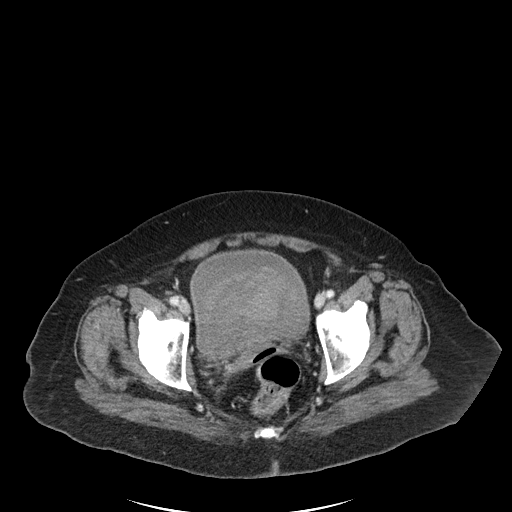
[im 28/92  soft-tissue]
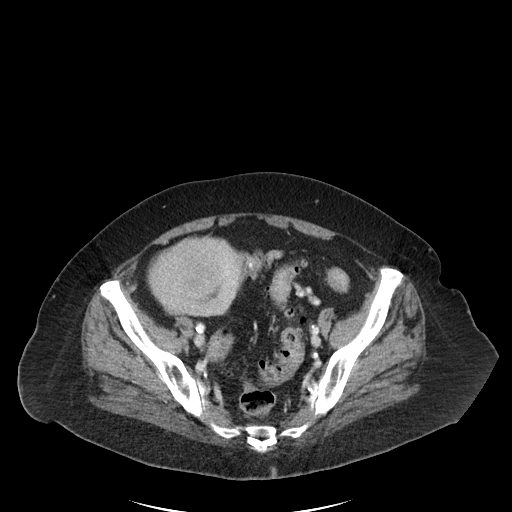
[im 37/92  soft-tissue]
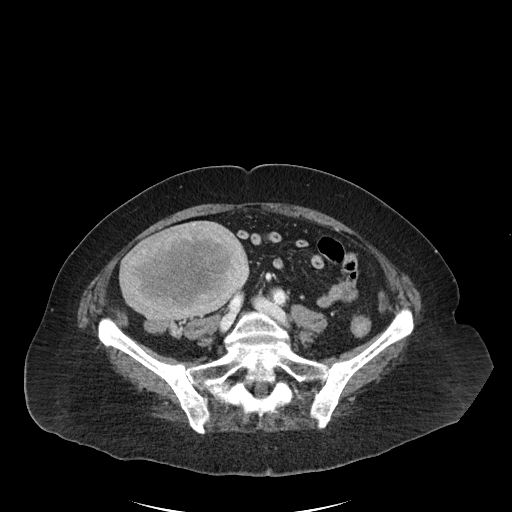
[im 46/92  soft-tissue]
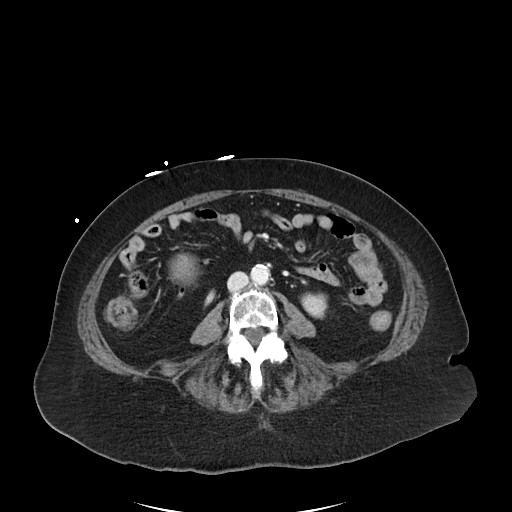
[im 55/92  soft-tissue]
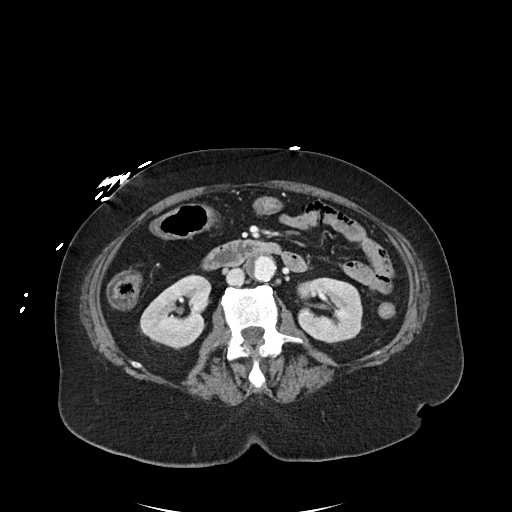
[im 64/92  soft-tissue]
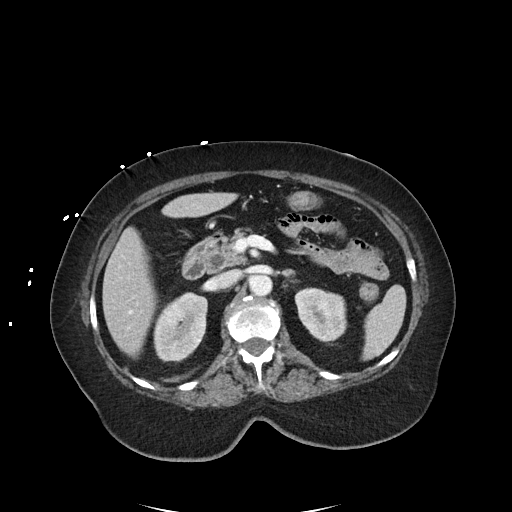
[im 73/92  soft-tissue]
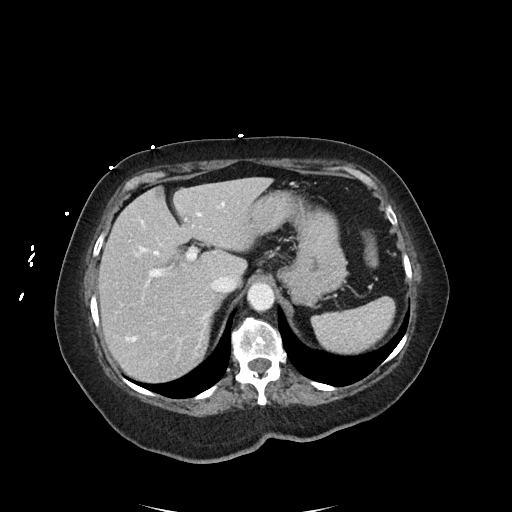
[im 82/92  soft-tissue]
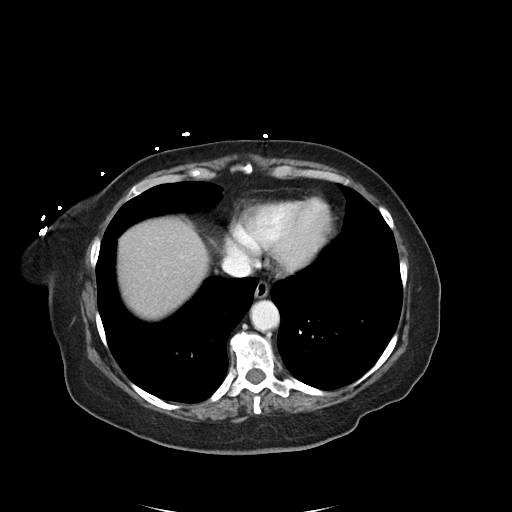
[im 82/92  bone]
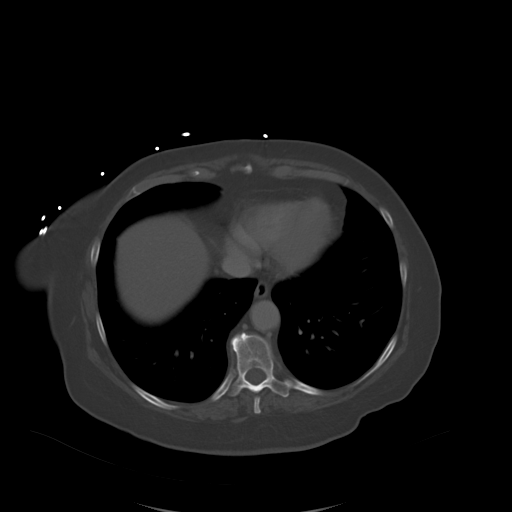

[Series 6: lung · axial · 0.88mm/px · z∈[+988,+1058]mm · 4 of 97 slices shown]
[im 9/97  bone]
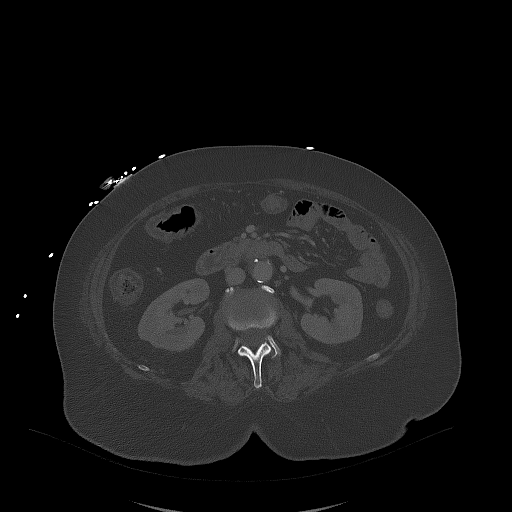
[im 18/97  bone]
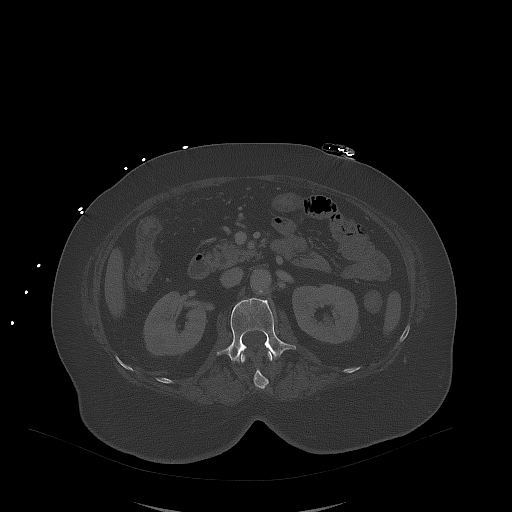
[im 35/97  bone]
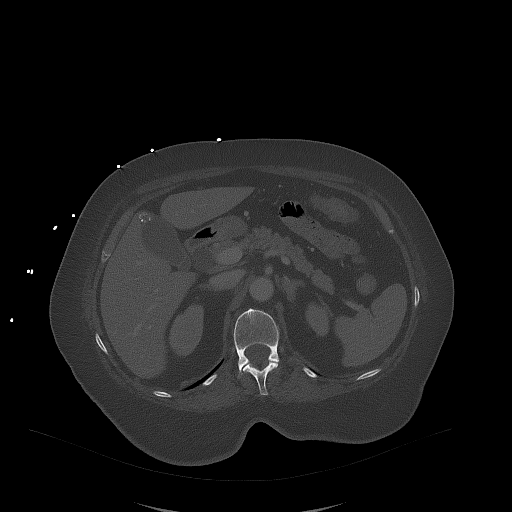
[im 44/97  bone]
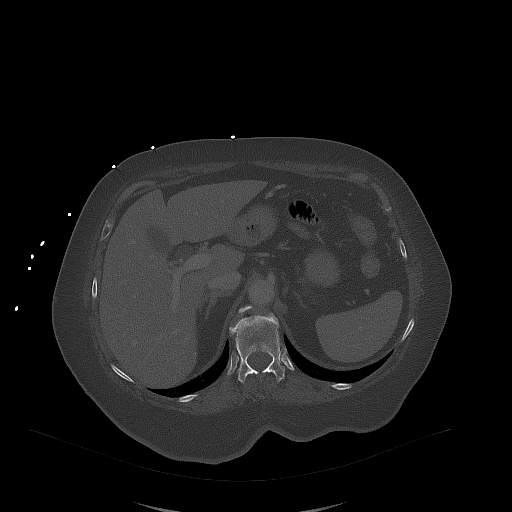

[Series 7: abdomen 3.0 mpr cor · coronal · 0.86mm/px · 3 of 101 slices shown]
[im 34/101  soft-tissue]
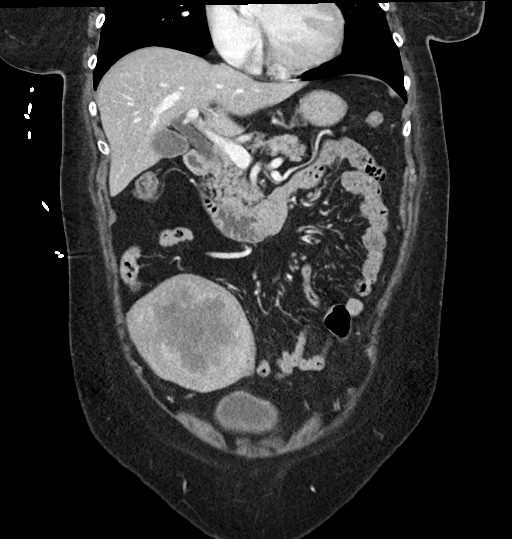
[im 45/101  soft-tissue]
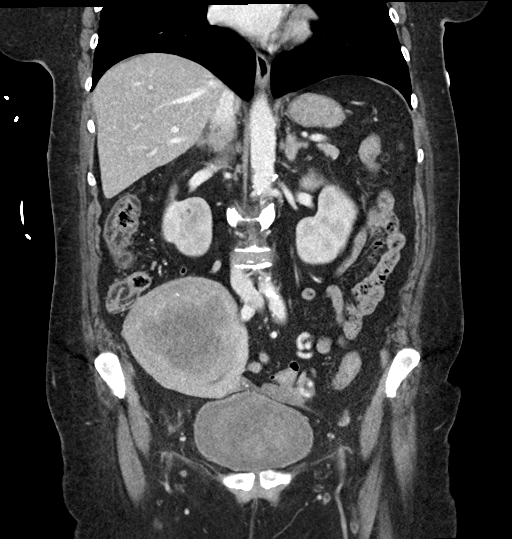
[im 56/101  soft-tissue]
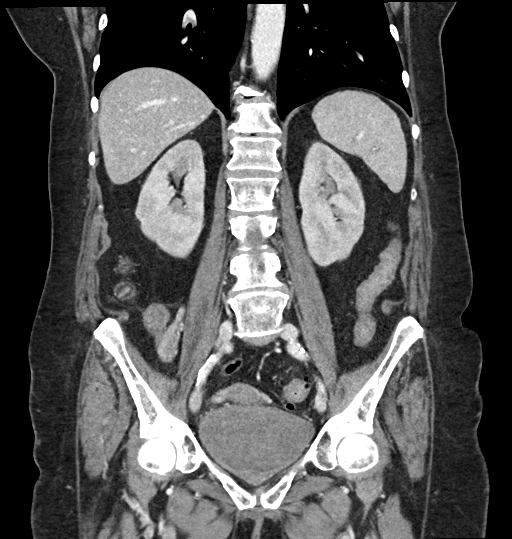

[16 of 46 positions shown; findings below may reference images not displayed]

FINDINGS: Lower chest: No acute abnormality.

Hepatobiliary: No focal lesion. Calcified gallstone. Dilatation of
the common bile duct measuring up to 1.1 cm. There is tapering
distally.

Pancreas: Unremarkable.

Spleen: Unremarkable.

Adrenals/Urinary Tract: Mild diffuse thickening of the left adrenal.
No hydronephrosis. 3 mm nonobstructing calculus of the lower pole of
the left kidney. There is heterogeneous density within the bladder
lumen. Mild wall thickening identified.

Stomach/Bowel: Stomach is within normal limits. Bowel is normal in
caliber. Sigmoid diverticulosis. Normal appendix.

Vascular/Lymphatic: Aortic atherosclerosis with calcified and
irregular noncalcified plaque. The infrarenal aorta measures up to
2.9 cm. More proximal aorta measures 1.9 cm. No enlarged lymph nodes
identified.

Reproductive: No adnexal mass. There is a heterogeneous exophytic
mass of the uterine fundus measuring approximately 8 x 12 x 11 cm.

Other: No ascites. No significant abnormality of the abdominal wall.

Musculoskeletal: Degenerative changes of the included spine. No
acute osseous abnormality.
IMPRESSION: Heterogeneous density within the bladder lumen likely reflecting
blood products. Mild wall thickening is present but no discrete mass
is identified.

Large exophytic mass of the uterine fundus measuring up to 12 cm.

Small nonobstructing left renal calculus.

Cholelithiasis. Mild dilatation of the proximal common bile duct
with distal tapering.

Sigmoid diverticulosis.

## 2019-09-17 IMAGING — MR MR MRA HEAD W/O CM
14 of 17 series · 33 of 48 positions shown · IV contrast (gadavist)
Comparison: Head CT [DATE], brain MRI [DATE], CT angiogram
head/neck [DATE].

CLINICAL DATA: Provided history: Brain mass or lesion.

EXAM:
MRI HEAD WITHOUT AND WITH CONTRAST
MRA HEAD WITHOUT CONTRAST
TECHNIQUE: Multiplanar, multiecho pulse sequences of the brain and surrounding
structures were obtained without and with intravenous contrast.
Angiographic images of the head were obtained using MRA technique
without contrast.
CONTRAST:  8mL GADAVIST GADOBUTROL 1 MMOL/ML IV SOLN

[Series 9: DWI · axial · 3.0mm · 0.88mm/px · z∈[-180,-34]mm · 5 of 100 slices shown (1 of 4)]
[im 1/100]
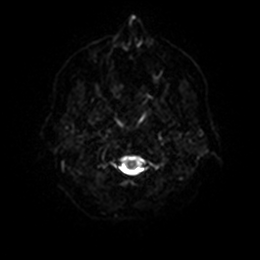
[im 25/100]
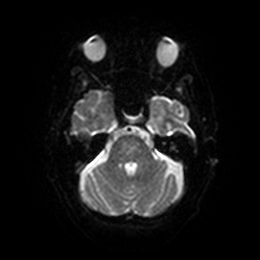
[im 50/100]
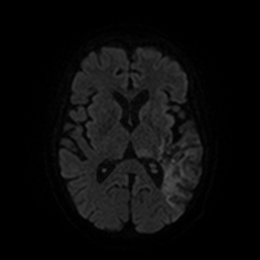
[im 75/100]
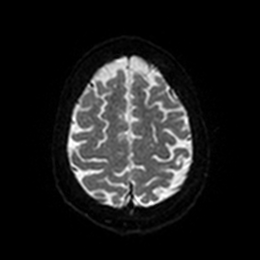
[im 100/100]
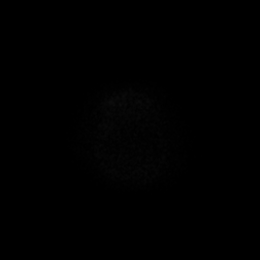

[Series 10: DWI · axial · 3.0mm · 0.88mm/px · z∈[-180,-34]mm · 3 of 50 slices shown (2 of 4)]
[im 1/50]
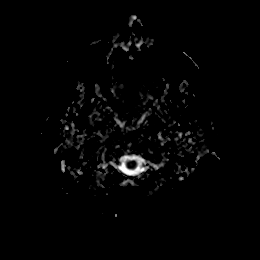
[im 25/50]
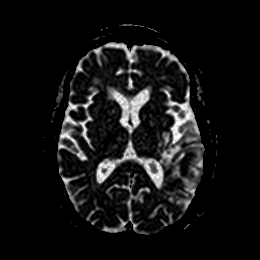
[im 50/50]
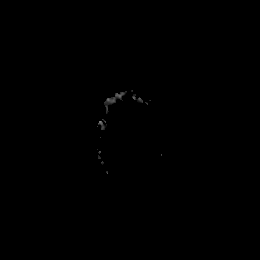

[Series 11: DWI · coronal · 4.0mm · 0.88mm/px · 4 of 76 slices shown (3 of 4)]
[im 1/76]
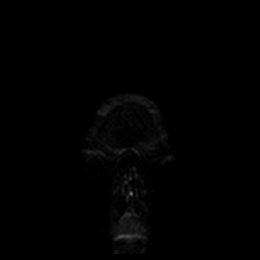
[im 26/76]
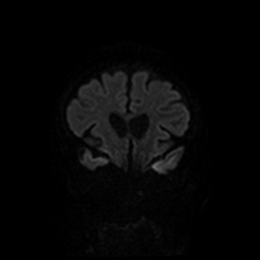
[im 51/76]
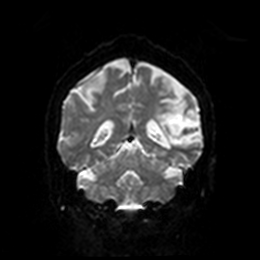
[im 76/76]
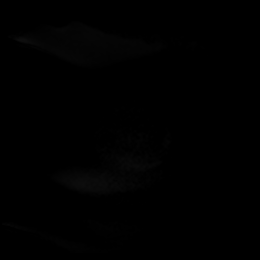

[Series 12: DWI · coronal · 4.0mm · 0.88mm/px · 2 of 38 slices shown (4 of 4)]
[im 1/38]
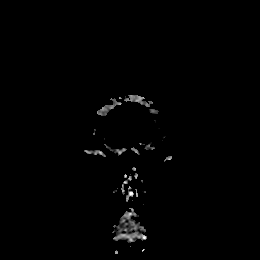
[im 38/38]
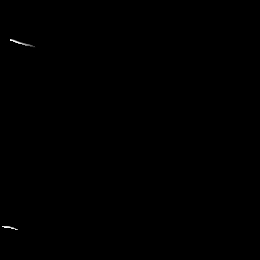

[Series 18: T1 · sagittal · 5.0mm · 0.94mm/px · 1 of 23 slices shown]
[im 1/23]
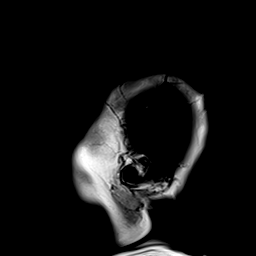

[Series 23: FLAIR · axial · 5.0mm · 0.45mm/px · 1 of 25 slices shown]
[im 1/25]
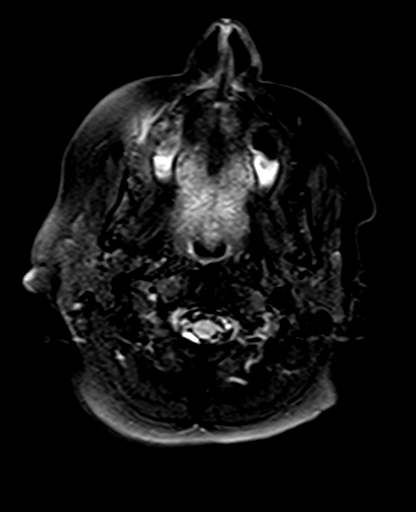

[Series 28: T2 · axial · 5.0mm · 0.90mm/px · 1 of 25 slices shown]
[im 1/25]
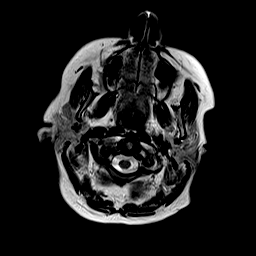

[Series 30: mag_images · axial · 3.0mm · 0.90mm/px · z∈[-167,-29]mm · 3 of 48 slices shown]
[im 1/48]
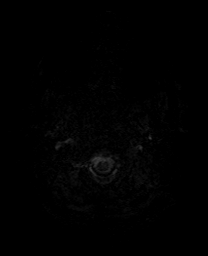
[im 24/48]
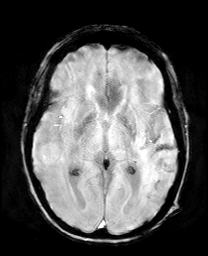
[im 48/48]
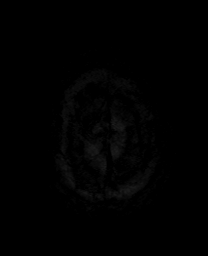

[Series 31: pha_images · axial · 3.0mm · 0.90mm/px · z∈[-167,-29]mm · 3 of 48 slices shown]
[im 1/48]
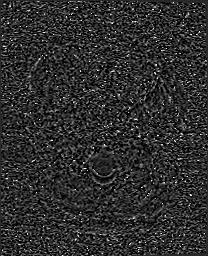
[im 24/48]
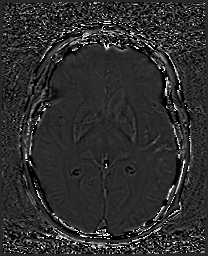
[im 48/48]
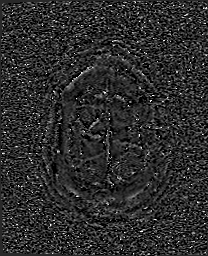

[Series 32: swi_images · axial · 3.0mm · 0.90mm/px · z∈[-167,-29]mm · 3 of 48 slices shown]
[im 1/48]
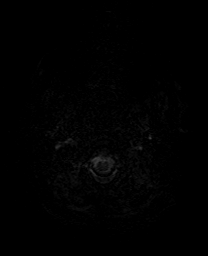
[im 24/48]
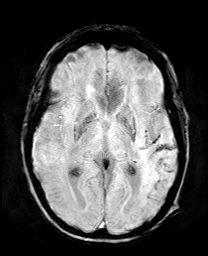
[im 48/48]
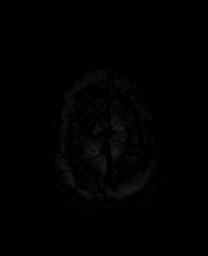

[Series 33: mip_images(sw) · axial · 24.0mm · 0.90mm/px · z∈[-157,-39]mm · 2 of 41 slices shown]
[im 1/41]
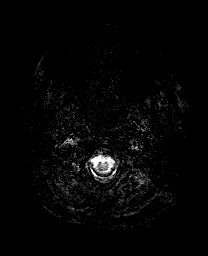
[im 41/41]
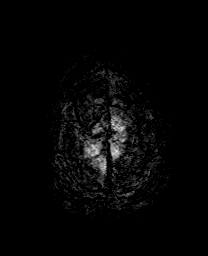

[Series 34: T2 post-contrast · coronal · 5.0mm · 0.90mm/px · 2 of 31 slices shown]
[im 1/31]
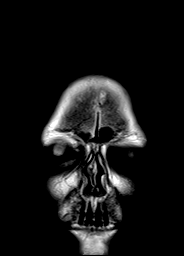
[im 31/31]
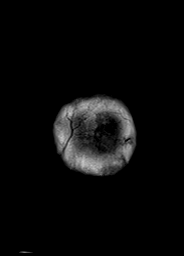

[Series 35: T1 post-contrast · sagittal · 5.0mm · 0.94mm/px · 1 of 23 slices shown (1 of 2)]
[im 1/23]
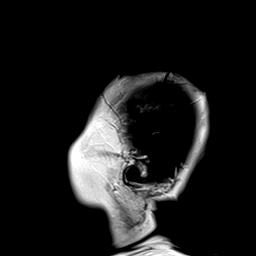

[Series 36: T1 post-contrast · coronal · 5.0mm · 0.34mm/px · 2 of 31 slices shown (2 of 2)]
[im 1/31]
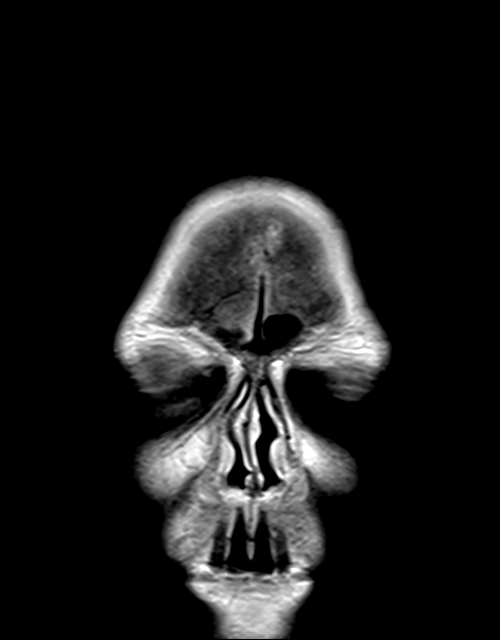
[im 31/31]
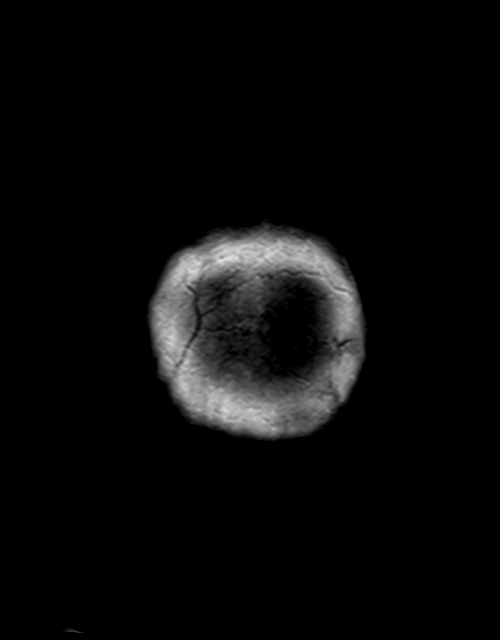

[33 of 48 positions shown; findings below may reference images not displayed]

FINDINGS: MRI HEAD FINDINGS

Brain:

The axial postcontrast T1 weighted imaging is mild to moderately
motion degraded.

Stable, mild generalized parenchymal atrophy.

There is a 10 mm focus of restricted diffusion within the
posteromedial left thalamus consistent with acute/early subacute
infarction (series 9, image 74).

Additionally, there is a subcentimeter focus of diffusion weighted
hyperintensity without definite T2 shine through within the
subcortical posterior left frontal lobe which which may reflect a
small subacute infarct (series 9, image 83).

Redemonstrated large late subacute/chronic left MCA vascular
territory infarct affecting portions of the left frontal, parietal
and temporal lobes as well as left insula and left caudate nucleus.
There is cortical T1 hyperintensity and SWI signal loss consistent
with chronic hemosiderin deposition and/or cortical laminar necrosis
at this site. Additionally, there is probable subtle cortical
enhancement at this site which is not unexpected for a late subacute
infarct.

Stable background mild scattered T2/FLAIR hyperintensity within the
cerebral white matter and pons which is nonspecific, but consistent
with chronic small vessel ischemic disease.

A lacunar infarct within the left lentiform nucleus is new as
compared to the prior MRI of [DATE], but not acute.

No evidence of intracranial mass.

No extra-axial fluid collection.

No midline shift.

Vascular: Reported below.

Skull and upper cervical spine: No focal marrow lesion.

Sinuses/Orbits: Visualized orbits show no acute finding. Mild
ethmoid sinus mucosal thickening. No significant mastoid effusion.

MRA HEAD FINDINGS

The intracranial internal carotid arteries are patent with no more
than mild atherosclerotic narrowing.

The M1 middle cerebral arteries are patent. Mild/moderate stenosis
at the origin of the M1 left MCA (series [YE], image 8). No left M2
proximal branch occlusion is identified. There are sites of moderate
stenosis within the proximal superior and inferior division left M2
MCA branches. Additionally, there is a high-grade focal stenosis
within a mid M2 inferior division left MCA branch (series [YE],
image 4). Atherosclerotic irregularity of the M2 and more distal
right MCA branches. Most notably, there is a severe stenosis within
a proximal to mid superior division right M2 MCA branch (series
[YE], image 12). The anterior cerebral arteries are patent without
appreciable high-grade proximal stenosis.

The intracranial vertebral arteries are patent without significant
stenosis, as is the basilar artery. The posterior cerebral arteries
are patent proximally. Redemonstrated moderate to moderately severe
stenosis within the right PCA at the P2/P3 junction (series [YE],
image 16). Sites of moderate stenosis within the P3 and more distal
left PCA. A left posterior communicating artery is present. The
right posterior communicating artery is hypoplastic or absent

No intracranial aneurysm is identified.
IMPRESSION: MRI brain:

1. Mild-to-moderate motion degradation of the axial T1 weighted
postcontrast imaging.
2. 10 mm acute/early subacute infarct within the posteromedial left
thalamus.
3. Subcentimeter suspected late subacute infarct within the
subcortical posterior left frontal lobe.
4. A lacunar infarct within the left lentiform nucleus is new as
compared to the prior MRI of [DATE], but not acute.
5. Redemonstrated large chronic left MCA territory infarct with
associated chronic hemosiderin deposition and/or cortical laminar
necrosis. Mild cortical enhancement is also present at this site,
which is not unexpected for a late subacute infarct.

MRA head:

1. No intracranial large vessel occlusion.
2. Intracranial atherosclerotic disease with multifocal stenoses,
most notably as follows.
3. Mild/moderate stenosis at the origin of the M1 left middle
cerebral artery.
4. Sites of moderate stenosis within the proximal superior and
inferior division left M2 MCA branches.
5. High-grade focal stenosis within a mid M2 inferior division left
MCA branch vessel.
6. Severe stenosis within a proximal to mid superior division right
M2 MCA branch vessel.
7. Moderate to moderately severe stenosis within the right PCA at
the P2/P3 junction.
8. Sites of up to moderate stenosis within the P3 and more distal
left PCA.

## 2019-09-17 IMAGING — CT CT HEAD W/O CM
4 series · 16 of 47 positions shown, 18 images · non-contrast
Comparison: Brain MRI [DATE], head CT [DATE]

CLINICAL DATA: Mental status change, unknown cause; history of
cancer.

EXAM:
CT HEAD WITHOUT CONTRAST
TECHNIQUE: Contiguous axial images were obtained from the base of the skull
through the vertex without intravenous contrast.

[Series 3: head wo · axial · 0.43mm/px · z∈[-162,-37]mm · 7 of 35 slices shown, 9 images]
[im 5/35  brain]
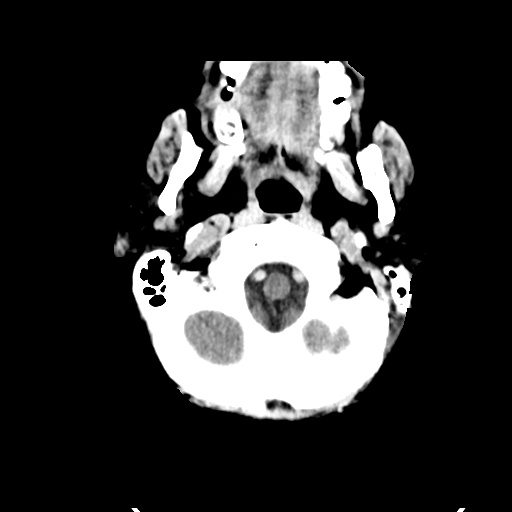
[im 5/35  bone]
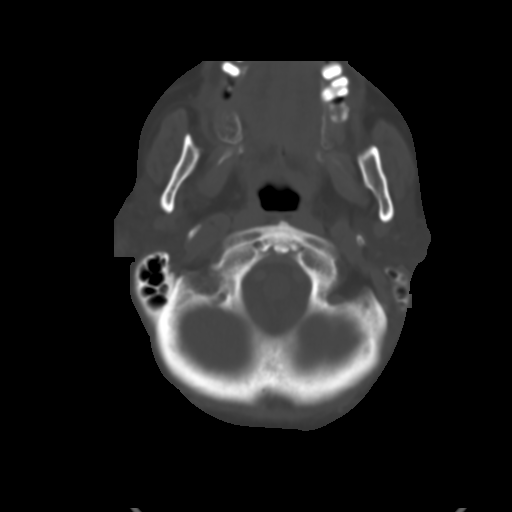
[im 9/35  brain]
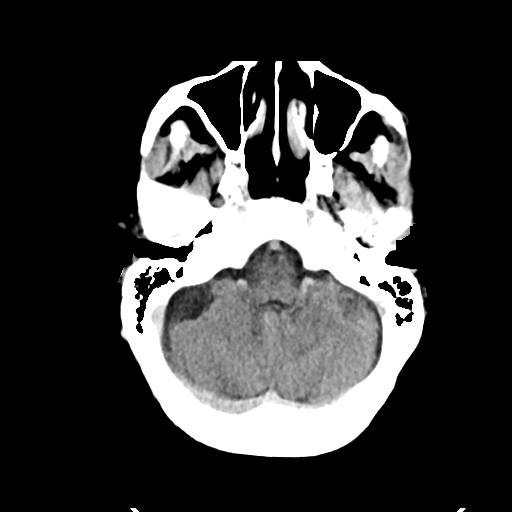
[im 13/35  brain]
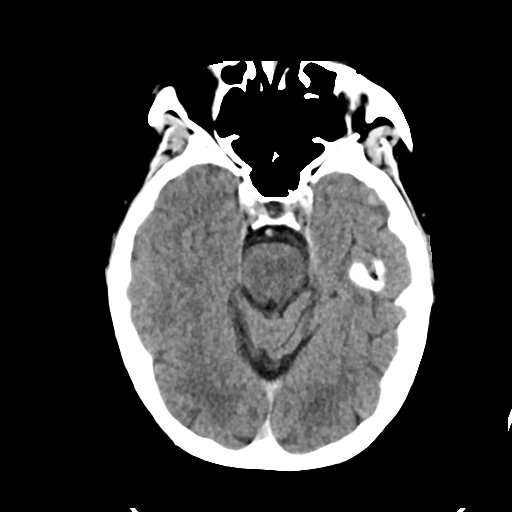
[im 18/35  brain]
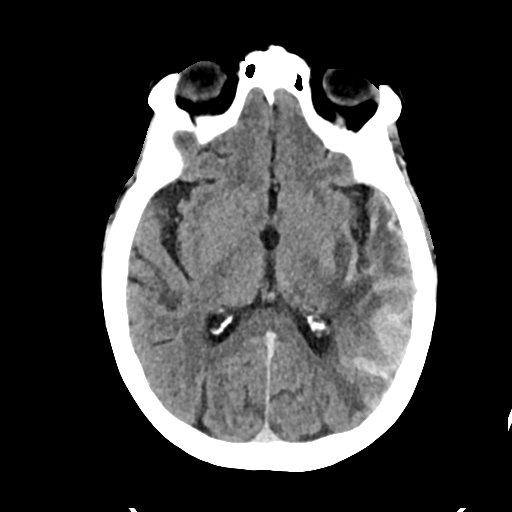
[im 22/35  brain]
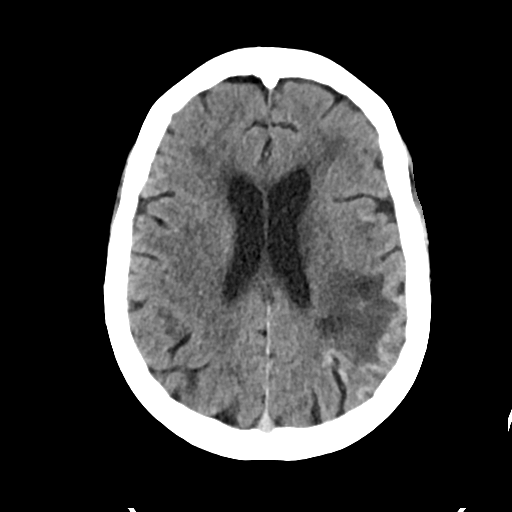
[im 22/35  bone]
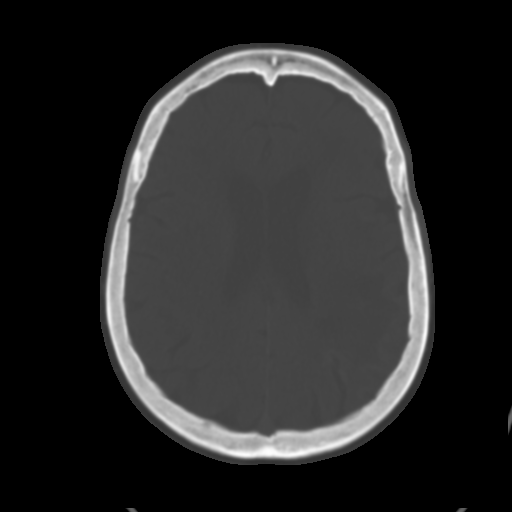
[im 26/35  brain]
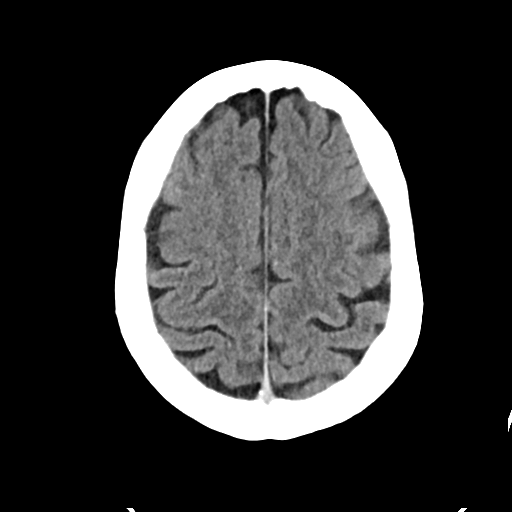
[im 30/35  brain]
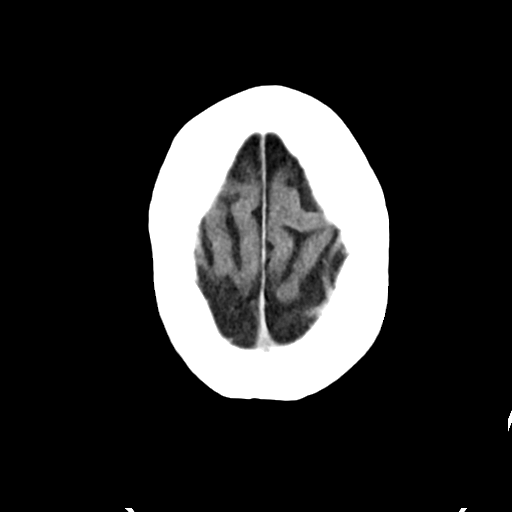

[Series 4: head bone · axial · 0.43mm/px · z∈[-166,-132]mm · 3 of 87 slices shown]
[im 9/87  bone]
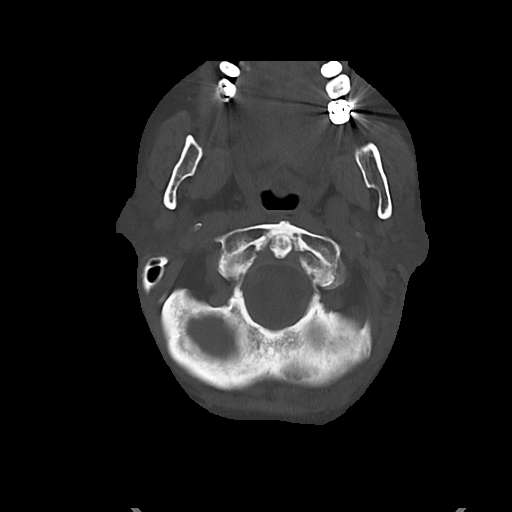
[im 18/87  bone]
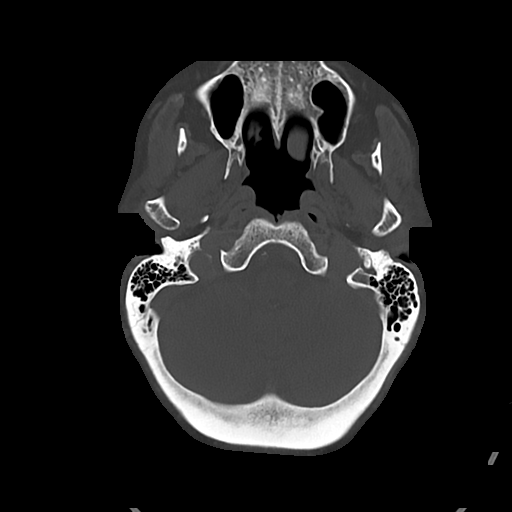
[im 26/87  bone]
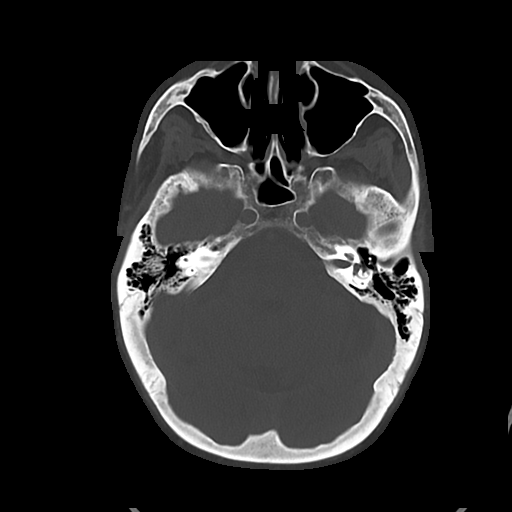

[Series 5: cor soft · coronal · 0.36mm/px · 3 of 75 slices shown]
[im 25/75  brain]
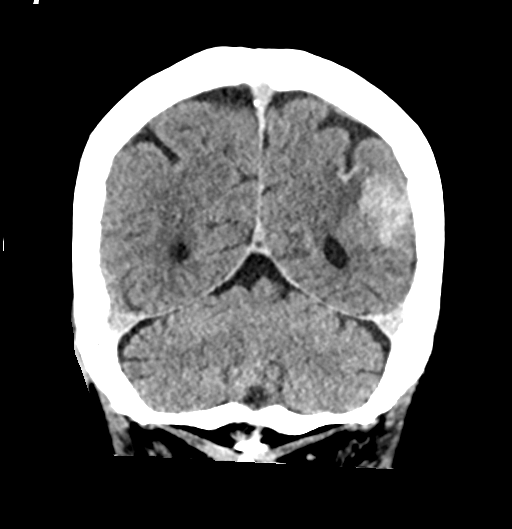
[im 33/75  brain]
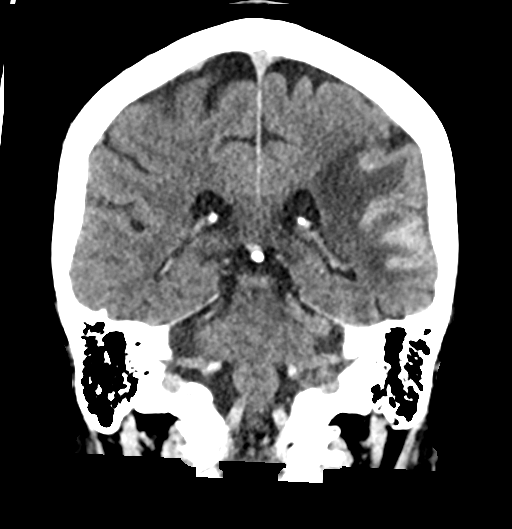
[im 42/75  brain]
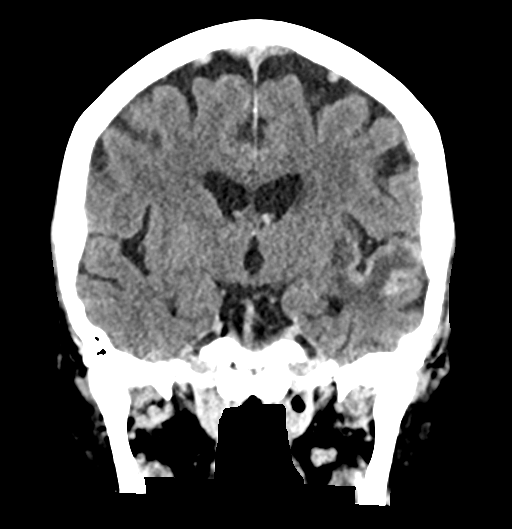

[Series 6: sag soft · sagittal · 0.37mm/px · 3 of 61 slices shown]
[im 21/61  brain]
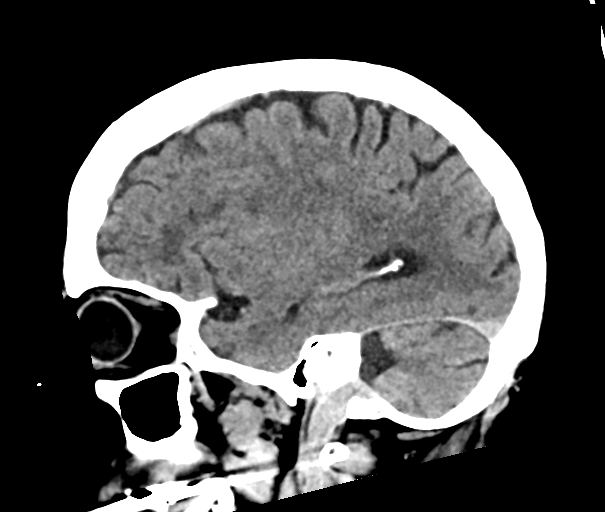
[im 31/61  brain]
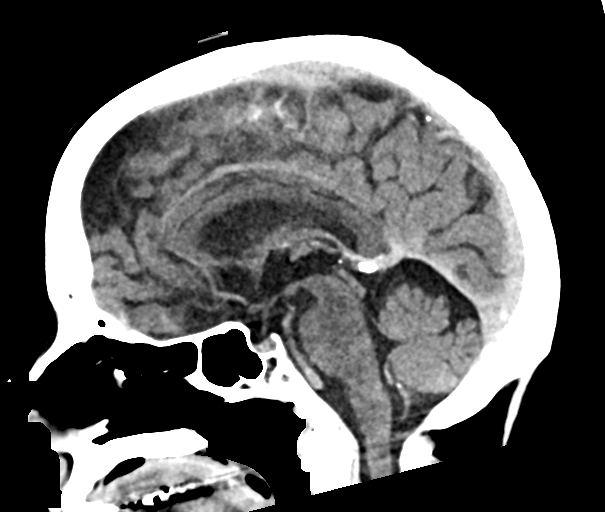
[im 41/61  brain]
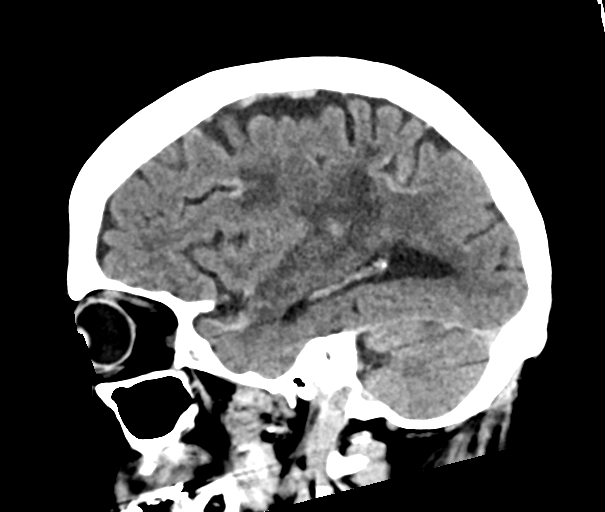

[16 of 47 positions shown; findings below may reference images not displayed]

FINDINGS: Brain:

Redemonstrated large, now late subacute to chronic, left MCA
vascular territory infarct affecting portions of the frontal,
parietal and temporal lobes as well as left insula. There is uniform
cortical hyperdensity throughout the infarction territory likely
reflecting cortical laminar necrosis.

New from prior examinations, there is a lacunar infarct within the
left lentiform nucleus which may be acute or subacute (series 3,
image 20).

Stable, mild generalized parenchymal atrophy.

Stable background mild ill-defined hypoattenuation within the
cerebral white matter which is nonspecific, but consistent with
chronic small vessel ischemic disease.

No extra-axial fluid collection.

No evidence of intracranial mass.

No midline shift.

Vascular: No definite hyperdense vessel. Atherosclerotic
calcifications. Partially visualized and within the cervical left
ICA.

Skull: Normal. Negative for fracture or focal lesion.

Sinuses/Orbits: Visualized orbits show no acute finding. No
significant paranasal sinus disease or mastoid effusion at the
imaged levels.
IMPRESSION: A lacunar infarct within the left lentiform nucleus is new as
compared to the MRI of [DATE], and may be acute or subacute.

Redemonstrated large, now late subacute to chronic, left MCA
vascular territory infarct. Uniform cortical hyperdensity throughout
the infarction territory likely reflects cortical laminar necrosis.

Stable background mild generalized parenchymal atrophy and chronic
small vessel ischemic disease.

## 2019-09-17 MED ORDER — ACETAMINOPHEN 650 MG RE SUPP: 650.0000 mg | Freq: Four times a day (QID) | RECTAL | Status: DC | PRN

## 2019-09-17 MED ORDER — GADOBUTROL 1 MMOL/ML IV SOLN
8.0000 mL | Freq: Once | INTRAVENOUS | Status: AC | PRN
  Administered 2019-09-17: 8 mL via INTRAVENOUS

## 2019-09-17 MED ORDER — BISACODYL 10 MG RE SUPP
10.0000 mg | Freq: Every day | RECTAL | Status: DC | PRN
  Filled 2019-09-17: qty 1

## 2019-09-17 MED ORDER — ACETAMINOPHEN 325 MG PO TABS
650.0000 mg | ORAL_TABLET | Freq: Four times a day (QID) | ORAL | Status: DC | PRN
  Administered 2019-09-18: 650 mg via ORAL
  Filled 2019-09-17: qty 2

## 2019-09-17 MED ORDER — STROKE: EARLY STAGES OF RECOVERY BOOK
Freq: Once | Status: AC
  Filled 2019-09-17: qty 1

## 2019-09-17 MED ORDER — LORAZEPAM 2 MG/ML IJ SOLN: 0.5000 mg | Freq: Once | INTRAMUSCULAR | Status: DC | PRN

## 2019-09-17 MED ORDER — SODIUM CHLORIDE 0.9 % IV BOLUS
1000.0000 mL | Freq: Once | INTRAVENOUS | Status: AC
  Administered 2019-09-17: 1000 mL via INTRAVENOUS

## 2019-09-17 MED ORDER — INSULIN ASPART 100 UNIT/ML ~~LOC~~ SOLN
0.0000 [IU] | Freq: Three times a day (TID) | SUBCUTANEOUS | Status: DC
  Administered 2019-09-18: 2 [IU] via SUBCUTANEOUS
  Administered 2019-09-18 (×2): 1 [IU] via SUBCUTANEOUS
  Administered 2019-09-19 (×2): 2 [IU] via SUBCUTANEOUS
  Administered 2019-09-20: 1 [IU] via SUBCUTANEOUS
  Administered 2019-09-20: 2 [IU] via SUBCUTANEOUS

## 2019-09-17 MED ORDER — SODIUM CHLORIDE 0.9 % IV SOLN: 10.0000 mL/h | Freq: Once | INTRAVENOUS | Status: DC

## 2019-09-17 MED ORDER — POLYETHYLENE GLYCOL 3350 17 G PO PACK
17.0000 g | PACK | Freq: Every day | ORAL | Status: DC | PRN
  Administered 2019-10-01: 17 g via ORAL
  Filled 2019-09-17 (×2): qty 1

## 2019-09-17 MED ORDER — SODIUM CHLORIDE 0.9 % IV SOLN: INTRAVENOUS | Status: DC

## 2019-09-17 MED ORDER — IOHEXOL 300 MG/ML  SOLN
75.0000 mL | Freq: Once | INTRAMUSCULAR | Status: AC | PRN
  Administered 2019-09-17: 75 mL via INTRAVENOUS

## 2019-09-17 NOTE — ED Triage Notes (Signed)
To ED via [facility] for eval of alerted loc. Abd pain. Hematuria. Per EMS pt is normally altered. Pt unable to tell triage nurse why she is here. Alert to place only. Per EMS pt's husband states pt is having hematuria. Reported to have bladder cancer. Pt is pale.

## 2019-09-17 NOTE — ED Provider Notes (Addendum)
[facility] EMERGENCY DEPARTMENT Provider Note   CSN: [id number] Arrival date & time: 09/17/19  0847     History Chief Complaint  Patient presents with  . Weakness  . Abdominal Pain  . Hematuria    Lisa [name] is a 69 y.o. female.  The history is provided by the patient, the spouse and medical records. No language interpreter was used.  Weakness Associated symptoms: abdominal pain   Abdominal Pain Associated symptoms: hematuria   Hematuria Associated symptoms include abdominal pain.     69 year old female with history of bladder cancer, prior stroke currently on Plavix, hypertension, hyperlipidemia, diabetes presenting complaining of abdominal pain.  Patient is a poor historian, history was a bit difficult to obtain.  Patient states she undergone some types of surgery several weeks prior and since then she has had recurrent pain to her left abdomen.  She described pain as uncomfortable but unable to describe the sensation.  It has been waxing and waning and since then she also noticed blood in her urine.  She denies any urinary discomfort or burning when she urinates.  She denies any subsequent abdominal pain at this time.  She does endorse feeling generally weak.  Does not complain of any fever chills headache lightheadedness chest pain shortness of breath or back pain.  When asked if patient has any cancer she denies.  Patient is a former smoker and drinker. Poor historian, level V caveats applies.   Past Medical History:  Diagnosis Date  . Carotid arterial disease ([place]) 07/2019  . Diabetes ([place])   . HTN (hypertension)   . Hyperlipidemia   . Stroke [facility])    acute/subacute left MCA infarct 08/12/19    Patient Active Problem List   Diagnosis Date Noted  . Pressure injury of skin 09/07/2019  . Gross hematuria 09/01/2019  . Hematuria 08/31/2019  . 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 Stenosis of carotid  artery   . Abnormality of aortic valve   . DM (diabetes mellitus), type 2, uncontrolled w/neurologic complication ([place])   . Hypercholesteremia   . CVA (cerebral vascular accident) ([place]) 08/12/2019  . Acute ischemic stroke ([place])   . Hypertension     Past Surgical History:  Procedure Laterality Date  . CYSTOSCOPY WITH FULGERATION N/A 09/01/2019   Procedure: CYSTOSCOPY CLOT EVACUATION WITH POSSIBLE FULGERATION;  Surgeon: Robley Fries, MD;  Location: [facility];  Service: Urology;  Laterality: N/A;  . TONSILLECTOMY    . TRANSCAROTID ARTERY REVASCULARIZATION Left 08/20/2019   Procedure: LEFT TRANSCAROTID ARTERY REVASCULARIZATION;  Surgeon: Waynetta Sandy, MD;  Location: [facility];  Service: Vascular;  Laterality: Left;     OB History   No obstetric history on file.     Family History  Problem Relation Age of Onset  . Hypertension Mother   . Lung cancer Father     Social History   Tobacco Use  . Smoking status: Former Smoker    Packs/day: 0.50    Types: Cigarettes    Quit date: 07/28/2019    Years since quitting: 0.1  . Smokeless tobacco: Never Used  Vaping Use  . Vaping Use: Never used  Substance Use Topics  . Alcohol use: Not Currently  . Drug use: Never    Home Medications Prior to Admission medications   Medication Sig Start Date End Date Taking? Authorizing Provider  amLODipine (NORVASC) 10 MG tablet Take 1 tablet (10 mg total) by mouth daily. 09/08/19  Patriciaann Clan, DO  aspirin EC 81 MG EC tablet Take 1 tablet (81 mg total) by mouth daily. Swallow whole. 09/08/19   Patriciaann Clan, DO  atorvastatin (LIPITOR) 80 MG tablet Take 1 tablet (80 mg total) by mouth daily. 08/15/19   Autry-Lott, Naaman Plummer, DO  clopidogrel (PLAVIX) 75 MG tablet Take 1 tablet (75 mg total) by mouth daily. 08/15/19   Autry-Lott, Naaman Plummer, DO  losartan (COZAAR) 25 MG tablet Take 1 tablet (25 mg total) by mouth daily. 09/07/19 10/07/19  Patriciaann Clan, DO  metFORMIN (GLUCOPHAGE XR) 500 MG 24  hr tablet Take 1 tablet (500 mg total) by mouth daily with breakfast. 08/14/19   Autry-Lott, Naaman Plummer, DO  metoprolol tartrate (LOPRESSOR) 25 MG tablet Take 1 tablet (25 mg total) by mouth 2 (two) times daily. 08/19/19 11/17/19  Barrett, Evelene Croon, PA-C  MULTIPLE VITAMIN PO Take 1 tablet by mouth daily.    [provider]    Allergies    Patient has no known allergies.  Review of Systems   Review of Systems  Gastrointestinal: Positive for abdominal pain.  Genitourinary: Positive for hematuria.  Neurological: Positive for weakness.  All other systems reviewed and are negative.   Physical Exam Updated Vital Signs BP (!) 111/56 (BP Location: Right Arm)   Pulse (!) 102   Temp 97.6 F (36.4 C) (Oral)   Resp 16   SpO2 100%   Physical Exam Vitals and nursing note reviewed. Exam conducted with a chaperone present.  Constitutional:      Appearance: She is well-developed.     Comments: Patient is pale and ill-appearing  HENT:     Head: Atraumatic.  Eyes:     Conjunctiva/sclera: Conjunctivae normal.  Cardiovascular:     Rate and Rhythm: Tachycardia present.     Heart sounds: Normal heart sounds.  Pulmonary:     Effort: Pulmonary effort is normal.     Breath sounds: Normal breath sounds. No wheezing, rhonchi or rales.  Abdominal:     General: Abdomen is flat.     Palpations: Abdomen is soft.     Tenderness: There is abdominal tenderness (Mild left-sided abdominal tenderness without focal point tenderness.  No guarding or rebound tenderness.).  Genitourinary:    Comments: Chaperone present during exam.  Blood noted in vaginal vault and urethra.  No significant blood noted in rectum with normal stool color.  No obvious mass noted. Musculoskeletal:     Cervical back: Neck supple.  Skin:    Findings: No rash.  Neurological:     Mental Status: She is alert.     GCS: GCS eye subscore is 4. GCS verbal subscore is 5. GCS motor subscore is 6.     Comments: Alert and oriented to self  place and situation but not to time or current event.  Exhibiting global weakness.     ED Results / Procedures / Treatments   Labs (all labs ordered are listed, but only abnormal results are displayed) Labs Reviewed  BASIC METABOLIC PANEL - Abnormal; Notable for the following components:      Result Value   Potassium 3.4 (*)    Chloride 97 (*)    Glucose, Bld 224 (*)    Creatinine, Ser 1.67 (*)    Calcium 8.8 (*)    GFR calc non Af Amer 32 (*)    GFR calc Af Amer 37 (*)    All other components within normal limits  CBC - Abnormal; Notable for the following components:  WBC 13.5 (*)    RBC 2.50 (*)    Hemoglobin 6.9 (*)    HCT 22.2 (*)    Platelets 488 (*)    All other components within normal limits  POC OCCULT BLOOD, ED - Abnormal; Notable for the following components:   Fecal Occult Bld POSITIVE (*)    All other components within normal limits  URINALYSIS, ROUTINE W REFLEX MICROSCOPIC  TYPE AND SCREEN  PREPARE RBC (CROSSMATCH)    EKG None  Radiology CT Head Wo Contrast  Result Date: 09/17/2019 CLINICAL DATA:  Mental status change, unknown cause; history of cancer. EXAM: CT HEAD WITHOUT CONTRAST TECHNIQUE: Contiguous axial images were obtained from the base of the skull through the vertex without intravenous contrast. COMPARISON:  Brain MRI 08/12/2019, head CT 08/12/2019 FINDINGS: Brain: Redemonstrated large, now late subacute to chronic, left MCA vascular territory infarct affecting portions of the frontal, parietal and temporal lobes as well as left insula. There is uniform cortical hyperdensity throughout the infarction territory likely reflecting cortical laminar necrosis. New from prior examinations, there is a lacunar infarct within the left lentiform nucleus which may be acute or subacute (series 3, image 20). Stable, mild generalized parenchymal atrophy. Stable background mild ill-defined hypoattenuation within the cerebral white matter which is nonspecific, but  consistent with chronic small vessel ischemic disease. No extra-axial fluid collection. No evidence of intracranial mass. No midline shift. Vascular: No definite hyperdense vessel. Atherosclerotic calcifications. Partially visualized and within the cervical left ICA. Skull: Normal. Negative for fracture or focal lesion. Sinuses/Orbits: Visualized orbits show no acute finding. No significant paranasal sinus disease or mastoid effusion at the imaged levels. IMPRESSION: A lacunar infarct within the left lentiform nucleus is new as compared to the MRI of 08/12/2019, and may be acute or subacute. Redemonstrated large, now late subacute to chronic, left MCA vascular territory infarct. Uniform cortical hyperdensity throughout the infarction territory likely reflects cortical laminar necrosis. Stable background mild generalized parenchymal atrophy and chronic small vessel ischemic disease. Electronically Signed   By: Kellie Simmering DO   On: 09/17/2019 13:57   CT ABDOMEN PELVIS W CONTRAST  Result Date: 09/17/2019 CLINICAL DATA:  Left lower quadrant pain, hematuria EXAM: CT ABDOMEN AND PELVIS WITH CONTRAST TECHNIQUE: Multidetector CT imaging of the abdomen and pelvis was performed using the standard protocol following bolus administration of intravenous contrast. CONTRAST:  36mL OMNIPAQUE IOHEXOL 300 MG/ML  SOLN COMPARISON:  None. FINDINGS: Lower chest: No acute abnormality. Hepatobiliary: No focal lesion. Calcified gallstone. Dilatation of the common bile duct measuring up to 1.1 cm. There is tapering distally. Pancreas: Unremarkable. Spleen: Unremarkable. Adrenals/Urinary Tract: Mild diffuse thickening of the left adrenal. No hydronephrosis. 3 mm nonobstructing calculus of the lower pole of the left kidney. There is heterogeneous density within the bladder lumen. Mild wall thickening identified. Stomach/Bowel: Stomach is within normal limits. Bowel is normal in caliber. Sigmoid diverticulosis. Normal appendix.  Vascular/Lymphatic: Aortic atherosclerosis with calcified and irregular noncalcified plaque. The infrarenal aorta measures up to 2.9 cm. More proximal aorta measures 1.9 cm. No enlarged lymph nodes identified. Reproductive: No adnexal mass. There is a heterogeneous exophytic mass of the uterine fundus measuring approximately 8 x 12 x 11 cm. Other: No ascites. No significant abnormality of the abdominal wall. Musculoskeletal: Degenerative changes of the included spine. No acute osseous abnormality. IMPRESSION: Heterogeneous density within the bladder lumen likely reflecting blood products. Mild wall thickening is present but no discrete mass is identified. Large exophytic mass of the uterine fundus measuring up to  12 cm. Small nonobstructing left renal calculus. Cholelithiasis. Mild dilatation of the proximal common bile duct with distal tapering. Sigmoid diverticulosis. Electronically Signed   By: Macy Mis M.D.   On: 09/17/2019 13:52    Procedures .Critical Care Performed by: Domenic Moras, PA-C Authorized by: Domenic Moras, PA-C   Critical care provider statement:    Critical care time (minutes):  40   Critical care was time spent personally by me on the following activities:  Discussions with consultants, evaluation of patient's response to treatment, examination of patient, ordering and performing treatments and interventions, ordering and review of laboratory studies, ordering and review of radiographic studies, pulse oximetry, re-evaluation of patient's condition, obtaining history from patient or surrogate and review of old charts   (including critical care time)  Medications Ordered in ED Medications  0.9 %  sodium chloride infusion (has no administration in time range)  sodium chloride 0.9 % bolus 1,000 mL (0 mLs Intravenous Stopped 09/17/19 1356)  iohexol (OMNIPAQUE) 300 MG/ML solution 75 mL (75 mLs Intravenous Contrast Given 09/17/19 1330)    ED Course  I have reviewed the triage vital  signs and the nursing notes.  Pertinent labs & imaging results that were available during my care of the patient were reviewed by me and considered in my medical decision making (see chart for details).    MDM Rules/Calculators/A&P                          BP (!) 148/67   Pulse 91   Temp 99.1 F (37.3 C) (Oral)   Resp (!) 22   SpO2 99%   Final Clinical Impression(s) / ED Diagnoses Final diagnoses:  Acute lacunar stroke (HCC)  Symptomatic anemia  Gross hematuria    Rx / DC Orders ED Discharge Orders    None     10:16 AM Patient with ?Bladder cancer here with left-sided abdominal pain as well as hematuria for the past several weeks.  She is pale in appearance with minimal abdominal tenderness. She does have blood noted in her vaginal vault but could likely coming from her urethra.  No obvious blood from her rectum as her stool color is normal.  She mention symptoms started several weeks ago when she underwent some types of surgical procedure.  Upon reviewing patient's previous notes it appears patient had a transcarotid revascularization performed by Dr. Donzetta Matters on 08/20/2019 after she had a stroke.  She is currently on Plavix.  Today hemoglobin is 6.9.  It was 8.6 approximately 10 days ago.  She is pale in appearance and would likely benefit from blood transfusion.  She has normal platelet.  Due to abdominal pain will obtain abdominal pelvic CT scan as well.  Pt has recent stroke and due to being a poor historian and mild R facial droop I will obtain head CT scan to assess for potential malignancy in the brain.    3:00 PM Head CT scan demonstrate a lacunar infarct in the left lentiform nucleus which is new when compared to MRI of 08/12/2019.  It may be acute or subacute.  Redemonstrated large now late subacute to chronic left MCA vascular territory infarct.  An abdominal pelvis CT scan obtained today shows a heterogeneous density within the bladder lumen likely reflecting blood products  with mild wall thickening but no discrete mass present.  There is a large exophytic mass of the uterus fundus measuring up to 12 cm.  Appreciate consultation from on-call  neurologist, Dr. Edwin Dada who will contact me back with additional recommendations as far as imaging goes.  Patient is currently receiving blood products, and I will consult medicine for admission.  Care discussed with Dr. Sherry Ruffing.  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  3:51 PM Appreciate consultation from [facility] medicine resident who agrees to see and admit pt for further management of symptomatic anemia.   Neurology have not give feedback in regard to further evaluation of pt's new lacunar stroke.    4:09 PM On call neurologist Dr. Curly Shores who has reviewed CT scan and will order brain MRI and MRA for further assessment.    MARIADEL MRUK was evaluated in Emergency Department on 09/17/2019 for the symptoms described in the history of present illness. She was evaluated in the context of the global COVID-19 pandemic, which necessitated consideration that the patient might be at risk for infection with the SARS-CoV-2 virus that causes COVID-19. Institutional protocols and algorithms that pertain to the evaluation of patients at risk for COVID-19 are in a state of rapid change based on information released by regulatory bodies including the CDC and federal and state organizations. These policies and algorithms were followed during the patient's care in the ED.    Domenic Moras, PA-C 09/17/19 1554    Domenic Moras, PA-C 09/17/19 1609    Tegeler, Gwenyth Allegra, MD 09/17/19 [phone]

## 2019-09-17 NOTE — ED Notes (Signed)
Attempted to call husband x 3

## 2019-09-17 NOTE — Progress Notes (Signed)
FPTS Interim Progress Note  S:went to pt bedside to introduce ourselves and get baseline neuro exam.  Pt doesn't have any complaints.  When asked her name and dob she stated she couldn't remember and that this forgetfulness 'comes and goes'.    O: BP 105/74   Pulse 90   Temp 98.5 F (36.9 C) (Oral)   Resp 18   SpO2 100%   Gen: alert. Cannot say her name or dob but will confirm if you tell her her name.  Neuro: strength in upper and lower extremities equal and intact b/l. No facial droop appreciated.  When pt asked to smile or puff out her cheeks she sticks her tongue out and eventually stated 'I don't know what you're asking me to do'.  Cranial nerves otherwise appear intact.    A/P: Imaging this afternoon showed a possible acute/early subacute infarct in posteromedial left thalamus and possible late subacute infarct within the subcortical posterior left frontal lobe.  Pt does not have any focal neurological deficits on my exam other than what appears to be a mild receptive aphasia and anomic aphasia. . During her last admission she appeared to have a expressive aphasia, including anomia. Pt is currently having urinary bleeding w/ hgb of 6.9 (now s/p 1 u pRBC and awaiting repeat hgb).  Discussed with attending, Dr. Owens Shark, and if pt's hgb is stable we will cautiously start ASA 325 given her MRI findings and concern for future CVA.  Also discussed this with neurologist who stated he agreed with 325mg  asa and also recommended permissive htn and consulting vascular regarding her stenosis.    [name], MD 09/17/2019, 9:33 PM PGY-3, [facility] Medicine Service pager [phone]

## 2019-09-17 NOTE — ED Notes (Signed)
This RN assess pt. Pt states that she does not know her name or DOB at this time. Pt reports that she knows that it but she just cant verbalize it at this time.

## 2019-09-17 NOTE — ED Notes (Addendum)
Spoke with [name], pt's husband; verbal consent given by husband for pt to receive blood

## 2019-09-17 NOTE — H&P (Signed)
[facility] Admission History and Physical Service Pager: [phone]  Patient name: Lisa [name] Medical record number: [id number] Date of birth: Jun 14, 1950 Age: 69 y.o. Gender: female  Primary Care Provider: Patient, No Pcp Per Consultants: Neuro Code Status: FULL Preferred Emergency Contact: Lenny Pastel, husband  Chief Complaint: "well, I guess I was bleeding"  Assessment and Plan: Jode Lippe Shupingis a 69 y.o. female presenting with gross hematuria and symptomatic anemic in the setting of papillary carcinoma of the bladder.PMH significant for recent ischemic CVA, recent carotid stent placement, HTN, HLD, DM2.  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 Hgb in ED 6.9. Was recently admitted last month for hematuria where she also required PRBC transfusion. At that time, cystoscopy with fulguration notable for significant tumor burden and pathology revealed high-grade papillary carcinoma of the bladder.  Patient a poor historian since recent CVA 6/30, on ASA and Plavix. It appears that patient was supposed to follow up with Urology tomorrow for re-staging. We will plan to [facility] while patient admitted. Patient with FOBT+ in ED, though after speaking with provider it appears that this was likely a contaminant as patient with active bleeding from urethra. Unfortunately, it also appears that patient has not been able to make her outpatient appointments with PCP since last d/c. Her anemia is likely from her most recently diagnosed bladder carcinoma as she was previously seen for similar symptoms and is actively bleeding. It is less likely that anemia is related to GI bleed even with the FOBT+. Husband does state that patient has not been eating well, could also additionally have superimposed iron-deficiency anemia.   - Admit inpatient teaching service, Dr. Owens Shark is the  attending - S/p 1U PRBC's x1 - Post-transfusion H&H -  S/p NS bolus x1 - Continue NS 129mL/hr - FYI Urology - Vitals q2h x12, then q4h - Holding Plavix and ASA - Morning CBCs, BMP - Continuous cardiac monitoring - Continuous pulse ox - Up with assistance - Fall precautions - Strict I's and O's - PT/OT consult  - SCD's for VTE PPx  New Lacunar Stroke on Head CT  Hx left MCA infarct 08/12/19 CT Head in the ED due to presumed AMS and unknown patient baseline. Significant for a lacunar infarct within the left lentiform nucleus that is new as compared to the MRI of 08/12/2019, and may be acute or subacute. Not a canditate for tPA, unknown last well also with gross hematuria. Neuro consulted in the ED. MRI/MRA revealing Intracranial atherosclerotic disease with multifocal stenoses, 10 mm acute/early subacute infarct within the posteromedial left Thalamus, subcentimeter suspected late subacute infarct within the subcortical posterior left frontal lobe, and a lacunar infarct within the left lentiform nucleus (see additional information below). Patient has difficulty with word finding. 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 It appears that patient was previously scheduled for outpatient TEE but there were complications with scheduling, we will plan to order this while inpatient.  - Neuro consulted, appreciate recommendations - Holding Plavix and ASA - Bedside swallow screen, if does not pass will do SLP eval; NPO for now - TEE  - Holding home BP medications to allow for permissive hypertension   AKI likely 2/2 hypovolemia: stable Creatinine on admission of 1.67 up from 0.64 on 09/07/2019.  Patient with gross hematuria at this time, this is likely due to  hypovolemia from active bleed and decreased PO intake. Patient appears dehydrated with pale conjunctiva on examination.  - s/p NS bolus  x1 - Continue IV hydration 100 mL/hr - repeat AM BMP - Holding home nephrotoxic medications at this time  - Bladder scans q8h   Hx of recent carotid stent Patient with carotid stent placement by Dr. Gwenlyn Saran from vascular surgery on 08/20/2019.  Patient was continued on her aspirin and Plavix for this. - Will hold Plavix and ASA due to active bleed  Hypertension Blood pressure on arrival 111/56.  Most recent blood pressure improved 135/64. Patient discharged on losartan 25 mg qd, metoprolol 25mg  BID, amlodipine 10 mg qd on 7/26, her most recent hospitalization. - Continue to monitor blood pressure - Holding home BP medications to allow for permissive HTN - Holding home losartan at this time due to AKI  Hyperlipidemia Patient had a panel performed on 7/1 which resulted in total cholesterol of 229, HDL 38, LDL 158, triglycerides 164.  She was started on atorvastatin 80 mg daily - Holding home atorvastatin 80 mg until bedside swallow - NPO until bedside swallow eval; Heart healthy, carb modified diet if passes   Type 2 diabetes mellitus Hemoglobin A1c on 7/1 was 8.2.  Patient was discharged on Metformin 500 mg daily. - Holding home Metformin  - sSSI - daily CBG's  FEN/GI: NPO until swallow eval Prophylaxis: SCD's  Disposition: Inpatient  History of Present Illness:  Lisa [name] is a 69 y.o. female presenting with gross hematuria and notable pallor. Patient is a poor historian due to previous CVA on admission 6/30. She denies falls, chest pain, shortness of breath, weakness, or other symptoms.   Per ED PA-C: Patient with low hemoglobin requiring blood transfusion, previously treated here for similar presentation. Feeling very weak, with increased hematuria. Hgb at 6.9, actively bleeding. Hx of prior stroke on Plavix. Head CT showing a new  Lacunar infarct within the left lentiform nucleus.   Per Husband: Patient was previously going to the bathroom, speech was good. She was also  getting dressed "but then yesterday something happened". Also reports that the patient has not eaten in days. Notes multiple trips to the hospital. Upon further questioning of hospitalizations, appeared that husband became agitated and stated his time was better spent with his wife.    Review Of Systems: Per HPI with the following additions:   Review of Systems   Patient Active Problem List   Diagnosis Date Noted  . Primary papillary carcinoma of bladder ([place]) 09/17/2019  . Pressure injury of skin 09/07/2019  . Gross hematuria 09/01/2019  . Hematuria 08/31/2019  . 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 Stenosis of carotid artery   . Abnormality of aortic valve   . DM (diabetes mellitus), type 2, uncontrolled w/neurologic complication ([place])   . Hypercholesteremia   . CVA (cerebral vascular accident) ([place]) 08/12/2019  . Acute ischemic stroke ([place])   . Hypertension     Past Medical History: Past Medical History:  Diagnosis Date  . Carotid arterial disease ([place]) 07/2019  . Diabetes ([place])   . HTN (hypertension)   . Hyperlipidemia   . Stroke [facility])    acute/subacute left MCA infarct 08/12/19    Past Surgical History: Past Surgical History:  Procedure Laterality Date  . CYSTOSCOPY WITH FULGERATION N/A 09/01/2019   Procedure: CYSTOSCOPY CLOT EVACUATION WITH POSSIBLE FULGERATION;  Surgeon: Robley Fries, MD;  Location: [facility];  Service: Urology;  Laterality:  N/A;  . TONSILLECTOMY    . TRANSCAROTID ARTERY REVASCULARIZATION Left 08/20/2019   Procedure: LEFT TRANSCAROTID ARTERY REVASCULARIZATION;  Surgeon: Waynetta Sandy, MD;  Location: [facility] OR;  Service: Vascular;  Laterality: Left;    Social History: Social History   Tobacco Use  . Smoking status: Former Smoker    Packs/day: 0.50    Types: Cigarettes    Quit date: 07/28/2019    Years since quitting: 0.1  . Smokeless tobacco: Never Used  Vaping Use  . Vaping Use: Never  used  Substance Use Topics  . Alcohol use: Not Currently  . Drug use: Never   Please also refer to relevant sections of EMR.  Family History: Family History  Problem Relation Age of Onset  . Hypertension Mother   . Lung cancer Father      Allergies and Medications: No Known Allergies No current facility-administered medications on file prior to encounter.   Current Outpatient Medications on File Prior to Encounter  Medication Sig Dispense Refill  . amLODipine (NORVASC) 10 MG tablet Take 1 tablet (10 mg total) by mouth daily. 30 tablet 0  . aspirin EC 81 MG EC tablet Take 1 tablet (81 mg total) by mouth daily. Swallow whole. 30 tablet 0  . atorvastatin (LIPITOR) 80 MG tablet Take 1 tablet (80 mg total) by mouth daily. 30 tablet 0  . clopidogrel (PLAVIX) 75 MG tablet Take 1 tablet (75 mg total) by mouth daily. 30 tablet 0  . losartan (COZAAR) 25 MG tablet Take 1 tablet (25 mg total) by mouth daily. 30 tablet 0  . metFORMIN (GLUCOPHAGE XR) 500 MG 24 hr tablet Take 1 tablet (500 mg total) by mouth daily with breakfast. 30 tablet 0  . metoprolol tartrate (LOPRESSOR) 25 MG tablet Take 1 tablet (25 mg total) by mouth 2 (two) times daily. 60 tablet 3  . MULTIPLE VITAMIN PO Take 1 tablet by mouth daily.      Objective: BP 135/64   Pulse 88   Temp 98.5 F (36.9 C) (Oral)   Resp (!) 22   SpO2 98%  Exam: General: Awake, pleasant, in no acute distress. Oriented to place only. Eyes: EOMI, no scleral icterus, pale conjunctiva ENTM: patent airway, appears dehydrated Neck: supple Cardiovascular: grade 2 systolic murmur, regular rhythm  Respiratory: CTAB, no wheezing/rhonchi/rales appreciated Gastrointestinal: soft, non-tender  MSK: moving extremities spontaneously  Derm: pale appearing Neuro: Some difficulty with word finding. Only oriented to place. Normal finger-nose-finger. Able to follow commands. Sensation intact.  Psych: Normal affect  Labs and Imaging: CBC BMET  Recent Labs   Lab 09/17/19 0907  WBC 13.5*  HGB 6.9*  HCT 22.2*  PLT 488*   Recent Labs  Lab 09/17/19 0907  NA 137  K 3.4*  CL 97*  CO2 25  BUN 16  CREATININE 1.67*  GLUCOSE 224*  CALCIUM 8.8*     EKG: Sinus Tach rate 106, no ischemic changes noted  MRI brain 8/5: 1. Mild-to-moderate motion degradation of the axial T1 weighted postcontrast imaging. 2. 10 mm acute/early subacute infarct within the posteromedial left thalamus. 3. Subcentimeter suspected late subacute infarct within the subcortical posterior left frontal lobe. 4. A lacunar infarct within the left lentiform nucleus is new as compared to the prior MRI of 08/12/2019, but not acute. 5. Redemonstrated large chronic left MCA territory infarct with associated chronic hemosiderin deposition and/or cortical laminar necrosis. Mild cortical enhancement is also present at this site, which is not unexpected for a late subacute infarct.  MRA head 8/5: 1. No intracranial large vessel occlusion. 2. Intracranial atherosclerotic disease with multifocal stenoses, most notably as follows. 3. Mild/moderate stenosis at the origin of the M1 left middle cerebral artery. 4. Sites of moderate stenosis within the proximal superior and inferior division left M2 MCA branches. 5. High-grade focal stenosis within a mid M2 inferior division left MCA branch vessel. 6. Severe stenosis within a proximal to mid superior division right M2 MCA branch vessel. 7. Moderate to moderately severe stenosis within the right PCA at the P2/P3 junction. 8. Sites of up to moderate stenosis within the P3 and more distal left PCA.  CT Head without Contrast 8/5 IMPRESSION: A lacunar infarct within the left lentiform nucleus is new as compared to the MRI of 08/12/2019, and may be acute or subacute. Redemonstrated large, now late subacute to chronic, left MCA vascular territory infarct. Uniform cortical hyperdensity throughout the infarction territory likely  reflects cortical laminar necrosis. Stable background mild generalized parenchymal atrophy and chronic small vessel ischemic disease.  Sharion Settler, DO 09/17/2019, 5:01 PM PGY-1, [place] Intern pager: [phone], text pages welcome

## 2019-09-17 NOTE — Progress Notes (Signed)
Received pt from the ED, alert and oriented to self, implemented MD orders, placed on Tele and verified, oriented to the room, call light in reach, continue to monitor.   09/17/19 2242  Vitals  Temp 98.5 F (36.9 C)  Temp Source Oral  BP 113/66  MAP (mmHg) 77  BP Location Left Arm  BP Method Automatic  Patient Position (if appropriate) Lying  Pulse Rate 85  Pulse Rate Source Monitor  ECG Heart Rate 85  Resp 16  MEWS COLOR  MEWS Score Color Green  Oxygen Therapy  SpO2 100 %  O2 Device Room Air  Pain Assessment  Pain Scale 0-10  Pain Score 0  Height and Weight  Weight 80.5 kg  BMI (Calculated) 30.45  MEWS Score  MEWS Temp 0  MEWS Systolic 0  MEWS Pulse 0  MEWS RR 0  MEWS LOC 0  MEWS Score 0

## 2019-09-17 NOTE — ED Notes (Signed)
Pt transported to CT ?

## 2019-09-17 NOTE — Progress Notes (Addendum)
Interim Progress Note:  Per [name], she was doing just fine the last few days then yesterday she said she didn't feel good, like something came over her. Speech was improved, brushing her teeth, dressing on her own. Then yesterday she wasn't feeling great, can't quite pinpoint what's wrong. He says she hasn't eaten in days but has eaten very little.   No night sweats, no fevers, no history of trauma. No traveling. I asked him if there were any changes in meds. He then states my name sounds familiar, says that if he were to look through his records he would see my name in his record. I stated that I have not taken care of the patient before which is why I called him to get more information. I asked about any appointments recently (in case we cannot see any visits in our EMR). He then became very quiet and said "My time is better spent visiting my wife than talking to you" and hung up.   The team will do their best to obtain history from the patient and obtain any additional history from the husband as he is able to provide.  Milus Banister, [place], PGY-3 09/17/2019 5:44 PM

## 2019-09-18 ENCOUNTER — Other Ambulatory Visit: Payer: Self-pay | Admitting: Urology

## 2019-09-18 ENCOUNTER — Inpatient Hospital Stay (HOSPITAL_COMMUNITY): Payer: Medicare Other

## 2019-09-18 DIAGNOSIS — I6381 Other cerebral infarction due to occlusion or stenosis of small artery: Secondary | ICD-10-CM | POA: Diagnosis not present

## 2019-09-18 LAB — COMPREHENSIVE METABOLIC PANEL
ALT: 11 U/L (ref 0–44)
AST: 16 U/L (ref 15–41)
Albumin: 2.4 g/dL — ABNORMAL LOW (ref 3.5–5.0)
Alkaline Phosphatase: 67 U/L (ref 38–126)
Anion gap: 14 (ref 5–15)
BUN: 13 mg/dL (ref 8–23)
CO2: 22 mmol/L (ref 22–32)
Calcium: 8.5 mg/dL — ABNORMAL LOW (ref 8.9–10.3)
Chloride: 101 mmol/L (ref 98–111)
Creatinine, Ser: 0.69 mg/dL (ref 0.44–1.00)
GFR calc Af Amer: >60 mL/min (ref >60)
GFR calc non Af Amer: >60 mL/min (ref >60)
Glucose, Bld: 124 mg/dL — ABNORMAL HIGH (ref 70–99)
Potassium: 3.1 mmol/L — ABNORMAL LOW (ref 3.5–5.1)
Sodium: 137 mmol/L (ref 135–145)
Total Bilirubin: 1.1 mg/dL (ref 0.3–1.2)
Total Protein: 5.1 g/dL — ABNORMAL LOW (ref 6.5–8.1)

## 2019-09-18 LAB — GLUCOSE, CAPILLARY
Glucose-Capillary: 132 mg/dL — ABNORMAL HIGH (ref 70–99)
Glucose-Capillary: 152 mg/dL — ABNORMAL HIGH (ref 70–99)
Glucose-Capillary: 125 mg/dL — ABNORMAL HIGH (ref 70–99)
Glucose-Capillary: 149 mg/dL — ABNORMAL HIGH (ref 70–99)

## 2019-09-18 LAB — URINALYSIS, MICROSCOPIC (REFLEX)
RBC / HPF: >50 RBC/hpf (ref 0–5)
WBC, UA: >50 WBC/hpf (ref 0–5)

## 2019-09-18 LAB — URINALYSIS, ROUTINE W REFLEX MICROSCOPIC

## 2019-09-18 LAB — LIPID PANEL
Cholesterol: 94 mg/dL (ref 0–200)
HDL: 32 mg/dL — ABNORMAL LOW (ref >40)
LDL Cholesterol: 46 mg/dL (ref 0–99)
Total CHOL/HDL Ratio: 2.9 RATIO
Triglycerides: 82 mg/dL (ref <150)
VLDL: 16 mg/dL (ref 0–40)

## 2019-09-18 LAB — CBC
HCT: 22.6 % — ABNORMAL LOW (ref 36.0–46.0)
Hemoglobin: 7.1 g/dL — ABNORMAL LOW (ref 12.0–15.0)
MCH: 27.2 pg (ref 26.0–34.0)
MCHC: 31.4 g/dL (ref 30.0–36.0)
MCV: 86.6 fL (ref 80.0–100.0)
Platelets: 351 10*3/uL (ref 150–400)
RBC: 2.61 MIL/uL — ABNORMAL LOW (ref 3.87–5.11)
RDW: 14.9 % (ref 11.5–15.5)
WBC: 10 10*3/uL (ref 4.0–10.5)
nRBC: 0 % (ref 0.0–0.2)

## 2019-09-18 LAB — HEMOGLOBIN AND HEMATOCRIT, BLOOD
HCT: 25 % — ABNORMAL LOW (ref 36.0–46.0)
Hemoglobin: 8.1 g/dL — ABNORMAL LOW (ref 12.0–15.0)

## 2019-09-18 LAB — PREPARE RBC (CROSSMATCH)

## 2019-09-18 LAB — HEMOGLOBIN A1C
Hgb A1c MFr Bld: 6.5 % — ABNORMAL HIGH (ref 4.8–5.6)
Mean Plasma Glucose: 139.85 mg/dL

## 2019-09-18 MED ORDER — POTASSIUM CHLORIDE CRYS ER 20 MEQ PO TBCR
40.0000 meq | EXTENDED_RELEASE_TABLET | ORAL | Status: AC
  Administered 2019-09-18 (×2): 40 meq via ORAL
  Filled 2019-09-18 (×2): qty 2

## 2019-09-18 MED ORDER — ASPIRIN EC 81 MG PO TBEC
81.0000 mg | DELAYED_RELEASE_TABLET | Freq: Every day | ORAL | Status: DC
  Administered 2019-09-18: 81 mg via ORAL
  Filled 2019-09-18: qty 1

## 2019-09-18 MED ORDER — BELLADONNA ALKALOIDS-OPIUM 16.2-60 MG RE SUPP
1.0000 | Freq: Three times a day (TID) | RECTAL | Status: DC | PRN
  Administered 2019-09-18: 1 via RECTAL
  Filled 2019-09-18 (×2): qty 1

## 2019-09-18 MED ORDER — SODIUM CHLORIDE 0.9% IV SOLUTION: Freq: Once | INTRAVENOUS | Status: AC

## 2019-09-18 MED ORDER — CHLORHEXIDINE GLUCONATE CLOTH 2 % EX PADS
6.0000 | MEDICATED_PAD | Freq: Every day | CUTANEOUS | Status: DC
  Administered 2019-09-18 – 2019-09-30 (×8): 6 via TOPICAL

## 2019-09-18 MED ORDER — OXYCODONE HCL 5 MG PO TABS
5.0000 mg | ORAL_TABLET | ORAL | Status: DC | PRN
  Administered 2019-09-18 (×2): 5 mg via ORAL
  Filled 2019-09-18 (×2): qty 1

## 2019-09-18 MED ORDER — ATORVASTATIN CALCIUM 40 MG PO TABS
40.0000 mg | ORAL_TABLET | Freq: Every day | ORAL | Status: DC
  Administered 2019-09-18 – 2019-10-02 (×13): 40 mg via ORAL
  Filled 2019-09-18 (×13): qty 1

## 2019-09-18 NOTE — Progress Notes (Signed)
Bladder scanned pt with 858ml, encouraged to void but stated      Does not have any urge to void, paged MD on call

## 2019-09-18 NOTE — Consult Note (Signed)
Requesting Physician: Dr. Owens Shark    Chief Complaint: Weakness  History obtained from: Patient and Chart    HPI:                                                                                                                                       Lisa [name] is a 69 y.o. female with past medical history significant for prior stroke in June 2021, intracranial and extracranial atherosclerotic disease with 80 to 99% left ICA stenosis on dual antiplatelet therapy with aspirin and Plavix, diabetes, hypertension, hyperlipidemia, known bladder cancer presents to the emergency department with weakness and gross hematuria resulting in symptomatic anemia.  Patient noted to have right facial droop and mild expressive aphasia (which appears to be residual deficits from prior left MCA infarct).  MRI brain revealed a 10 mm acute infarct in the posterior medial left thalamus and neurology was consulted for further recommendations.  Due to hematuria Plavix has been held.  Patient also was supposed to get a TEE for AV mass which was deferred to be performed outpatient.  Date last known well: Unclear tPA Given: No, outside TPA window NIHSS 5 Baseline MRS: 2  Past Medical History:  Diagnosis Date   Carotid arterial disease ([place]) 07/2019   Diabetes ([place])    HTN (hypertension)    Hyperlipidemia    Stroke [facility])    acute/subacute left MCA infarct 08/12/19    Past Surgical History:  Procedure Laterality Date   CYSTOSCOPY WITH FULGERATION N/A 09/01/2019   Procedure: CYSTOSCOPY CLOT EVACUATION WITH POSSIBLE FULGERATION;  Surgeon: Robley Fries, MD;  Location: [place];  Service: Urology;  Laterality: N/A;   TONSILLECTOMY     TRANSCAROTID ARTERY REVASCULARIZATION  Left 08/20/2019   Procedure: LEFT TRANSCAROTID ARTERY REVASCULARIZATION;  Surgeon: Waynetta Sandy, MD;  Location: [facility] OR;  Service: Vascular;  Laterality: Left;    Family History  Problem Relation Age of Onset   Hypertension Mother     Lung cancer Father    Social History:  reports that she quit smoking about 7 weeks ago. Her smoking use included cigarettes. She smoked 0.50 packs per day. She has never used smokeless tobacco. She reports previous alcohol use. She reports that she does not use drugs.  Allergies: No Known Allergies  Medications:  I reviewed home medications   ROS:                                                                                                                                     14 systems reviewed and negative except above    Examination:                                                                                                      General: Appears well-developed  Psych: Affect appropriate to situation Eyes: No scleral injection HENT: No OP obstrucion Head: Normocephalic.  Cardiovascular: Normal rate and regular rhythm.  Respiratory: Effort normal and breath sounds normal to anterior ascultation GI: Soft.  No distension. There is no tenderness.  Skin: WDI    Neurological Examination Mental Status: Alert, oriented,  Mild expressive aphasia with acalculia.  Able to follow 3 step commands without difficulty. Cranial Nerves: II: Visual fields: Right quadrantanopsia,  III,IV, VI: ptosis not present, extra-ocular motions intact bilaterally, pupils equal, round, reactive to light and accommodation V,VII: Mild right facial droop, facial light touch sensation normal bilaterally VIII: hearing normal bilaterally IX,X: uvula rises symmetrically XI: bilateral shoulder shrug XII: midline tongue extension Motor: Right : Upper extremity   5/5    Left:     Upper extremity   5/5  Lower extremity   5/5     Lower extremity   5/5 Tone and bulk:normal tone throughout; no atrophy noted Sensory: Pinprick and light touch intact throughout, bilaterally Plantars: Right:  downgoing   Left: downgoing Cerebellar: No gross ataxia       Lab Results: Basic Metabolic Panel: Recent Labs  Lab 09/17/19 0907  NA 137  K 3.4*  CL 97*  CO2 25  GLUCOSE 224*  BUN 16  CREATININE 1.67*  CALCIUM 8.8*    CBC: Recent Labs  Lab 09/17/19 0907 09/17/19 2214  WBC 13.5*  --   HGB 6.9* 7.0*  HCT 22.2* 22.2*  MCV 88.8  --   PLT 488*  --     Coagulation Studies: No results for input(s): LABPROT, INR in the last 72 hours.  Imaging: CT Head Wo Contrast  Result Date: 09/17/2019 CLINICAL DATA:  Mental status change, unknown cause; history of cancer. EXAM: CT HEAD WITHOUT CONTRAST TECHNIQUE: Contiguous axial images were obtained from the base of the skull through the vertex without intravenous contrast. COMPARISON:  Brain MRI 08/12/2019, head CT 08/12/2019 FINDINGS: Brain: Redemonstrated large, now late subacute to chronic, left MCA vascular territory  infarct affecting portions of the frontal, parietal and temporal lobes as well as left insula. There is uniform cortical hyperdensity throughout the infarction territory likely reflecting cortical laminar necrosis. New from prior examinations, there is a lacunar infarct within the left lentiform nucleus which may be acute or subacute (series 3, image 20). Stable, mild generalized parenchymal atrophy. Stable background mild ill-defined hypoattenuation within the cerebral white matter which is nonspecific, but consistent with chronic small vessel ischemic disease. No extra-axial fluid collection. No evidence of intracranial mass. No midline shift. Vascular: No definite hyperdense vessel. Atherosclerotic calcifications. Partially visualized and within the cervical left ICA. Skull: Normal. Negative for fracture or focal lesion. Sinuses/Orbits: Visualized orbits show no acute finding. No significant paranasal sinus disease or mastoid effusion at the imaged levels. IMPRESSION: A lacunar infarct within the left lentiform nucleus is new  as compared to the MRI of 08/12/2019, and may be acute or subacute. Redemonstrated large, now late subacute to chronic, left MCA vascular territory infarct. Uniform cortical hyperdensity throughout the infarction territory likely reflects cortical laminar necrosis. Stable background mild generalized parenchymal atrophy and chronic small vessel ischemic disease. Electronically Signed   By: Kellie Simmering DO   On: 09/17/2019 13:57   MR ANGIO HEAD WO CONTRAST  Result Date: 09/17/2019 CLINICAL DATA:  Provided history: Brain mass or lesion. EXAM: MRI HEAD WITHOUT AND WITH CONTRAST MRA HEAD WITHOUT CONTRAST TECHNIQUE: Multiplanar, multiecho pulse sequences of the brain and surrounding structures were obtained without and with intravenous contrast. Angiographic images of the head were obtained using MRA technique without contrast. CONTRAST:  74mL GADAVIST GADOBUTROL 1 MMOL/ML IV SOLN COMPARISON:  Head CT 09/17/2019, brain MRI 08/12/2019, CT angiogram head/neck 08/12/2019. FINDINGS: MRI HEAD FINDINGS Brain: The axial postcontrast T1 weighted imaging is mild to moderately motion degraded. Stable, mild generalized parenchymal atrophy. There is a 10 mm focus of restricted diffusion within the posteromedial left thalamus consistent with acute/early subacute infarction (series 9, image 74). Additionally, there is a subcentimeter focus of diffusion weighted hyperintensity without definite T2 shine through within the subcortical posterior left frontal lobe which which may reflect a small subacute infarct (series 9, image 83). Redemonstrated large late subacute/chronic left MCA vascular territory infarct affecting portions of the left frontal, parietal and temporal lobes as well as left insula and left caudate nucleus. There is cortical T1 hyperintensity and SWI signal loss consistent with chronic hemosiderin deposition and/or cortical laminar necrosis at this site. Additionally, there is probable subtle cortical enhancement at  this site which is not unexpected for a late subacute infarct. Stable background mild scattered T2/FLAIR hyperintensity within the cerebral white matter and pons which is nonspecific, but consistent with chronic small vessel ischemic disease. A lacunar infarct within the left lentiform nucleus is new as compared to the prior MRI of 08/12/2019, but not acute. No evidence of intracranial mass. No extra-axial fluid collection. No midline shift. Vascular: Reported below. Skull and upper cervical spine: No focal marrow lesion. Sinuses/Orbits: Visualized orbits show no acute finding. Mild ethmoid sinus mucosal thickening. No significant mastoid effusion. MRA HEAD FINDINGS The intracranial internal carotid arteries are patent with no more than mild atherosclerotic narrowing. The M1 middle cerebral arteries are patent. Mild/moderate stenosis at the origin of the M1 left MCA (series 1064, image 8). No left M2 proximal branch occlusion is identified. There are sites of moderate stenosis within the proximal superior and inferior division left M2 MCA branches. Additionally, there is a high-grade focal stenosis within a mid M2 inferior division left  MCA branch (series 1070, image 4). Atherosclerotic irregularity of the M2 and more distal right MCA branches. Most notably, there is a severe stenosis within a proximal to mid superior division right M2 MCA branch (series 1070, image 12). The anterior cerebral arteries are patent without appreciable high-grade proximal stenosis. The intracranial vertebral arteries are patent without significant stenosis, as is the basilar artery. The posterior cerebral arteries are patent proximally. Redemonstrated moderate to moderately severe stenosis within the right PCA at the P2/P3 junction (series 1076, image 16). Sites of moderate stenosis within the P3 and more distal left PCA. A left posterior communicating artery is present. The right posterior communicating artery is hypoplastic or absent  No intracranial aneurysm is identified. IMPRESSION: MRI brain: 1. Mild-to-moderate motion degradation of the axial T1 weighted postcontrast imaging. 2. 10 mm acute/early subacute infarct within the posteromedial left thalamus. 3. Subcentimeter suspected late subacute infarct within the subcortical posterior left frontal lobe. 4. A lacunar infarct within the left lentiform nucleus is new as compared to the prior MRI of 08/12/2019, but not acute. 5. Redemonstrated large chronic left MCA territory infarct with associated chronic hemosiderin deposition and/or cortical laminar necrosis. Mild cortical enhancement is also present at this site, which is not unexpected for a late subacute infarct. MRA head: 1. No intracranial large vessel occlusion. 2. Intracranial atherosclerotic disease with multifocal stenoses, most notably as follows. 3. Mild/moderate stenosis at the origin of the M1 left middle cerebral artery. 4. Sites of moderate stenosis within the proximal superior and inferior division left M2 MCA branches. 5. High-grade focal stenosis within a mid M2 inferior division left MCA branch vessel. 6. Severe stenosis within a proximal to mid superior division right M2 MCA branch vessel. 7. Moderate to moderately severe stenosis within the right PCA at the P2/P3 junction. 8. Sites of up to moderate stenosis within the P3 and more distal left PCA. Electronically Signed   By: Kellie Simmering DO   On: 09/17/2019 18:47   MR BRAIN W WO CONTRAST  Result Date: 09/17/2019 CLINICAL DATA:  Provided history: Brain mass or lesion. EXAM: MRI HEAD WITHOUT AND WITH CONTRAST MRA HEAD WITHOUT CONTRAST TECHNIQUE: Multiplanar, multiecho pulse sequences of the brain and surrounding structures were obtained without and with intravenous contrast. Angiographic images of the head were obtained using MRA technique without contrast. CONTRAST:  68mL GADAVIST GADOBUTROL 1 MMOL/ML IV SOLN COMPARISON:  Head CT 09/17/2019, brain MRI 08/12/2019, CT  angiogram head/neck 08/12/2019. FINDINGS: MRI HEAD FINDINGS Brain: The axial postcontrast T1 weighted imaging is mild to moderately motion degraded. Stable, mild generalized parenchymal atrophy. There is a 10 mm focus of restricted diffusion within the posteromedial left thalamus consistent with acute/early subacute infarction (series 9, image 74). Additionally, there is a subcentimeter focus of diffusion weighted hyperintensity without definite T2 shine through within the subcortical posterior left frontal lobe which which may reflect a small subacute infarct (series 9, image 83). Redemonstrated large late subacute/chronic left MCA vascular territory infarct affecting portions of the left frontal, parietal and temporal lobes as well as left insula and left caudate nucleus. There is cortical T1 hyperintensity and SWI signal loss consistent with chronic hemosiderin deposition and/or cortical laminar necrosis at this site. Additionally, there is probable subtle cortical enhancement at this site which is not unexpected for a late subacute infarct. Stable background mild scattered T2/FLAIR hyperintensity within the cerebral white matter and pons which is nonspecific, but consistent with chronic small vessel ischemic disease. A lacunar infarct within the left lentiform nucleus  is new as compared to the prior MRI of 08/12/2019, but not acute. No evidence of intracranial mass. No extra-axial fluid collection. No midline shift. Vascular: Reported below. Skull and upper cervical spine: No focal marrow lesion. Sinuses/Orbits: Visualized orbits show no acute finding. Mild ethmoid sinus mucosal thickening. No significant mastoid effusion. MRA HEAD FINDINGS The intracranial internal carotid arteries are patent with no more than mild atherosclerotic narrowing. The M1 middle cerebral arteries are patent. Mild/moderate stenosis at the origin of the M1 left MCA (series 1064, image 8). No left M2 proximal branch occlusion is  identified. There are sites of moderate stenosis within the proximal superior and inferior division left M2 MCA branches. Additionally, there is a high-grade focal stenosis within a mid M2 inferior division left MCA branch (series 1070, image 4). Atherosclerotic irregularity of the M2 and more distal right MCA branches. Most notably, there is a severe stenosis within a proximal to mid superior division right M2 MCA branch (series 1070, image 12). The anterior cerebral arteries are patent without appreciable high-grade proximal stenosis. The intracranial vertebral arteries are patent without significant stenosis, as is the basilar artery. The posterior cerebral arteries are patent proximally. Redemonstrated moderate to moderately severe stenosis within the right PCA at the P2/P3 junction (series 1076, image 16). Sites of moderate stenosis within the P3 and more distal left PCA. A left posterior communicating artery is present. The right posterior communicating artery is hypoplastic or absent No intracranial aneurysm is identified. IMPRESSION: MRI brain: 1. Mild-to-moderate motion degradation of the axial T1 weighted postcontrast imaging. 2. 10 mm acute/early subacute infarct within the posteromedial left thalamus. 3. Subcentimeter suspected late subacute infarct within the subcortical posterior left frontal lobe. 4. A lacunar infarct within the left lentiform nucleus is new as compared to the prior MRI of 08/12/2019, but not acute. 5. Redemonstrated large chronic left MCA territory infarct with associated chronic hemosiderin deposition and/or cortical laminar necrosis. Mild cortical enhancement is also present at this site, which is not unexpected for a late subacute infarct. MRA head: 1. No intracranial large vessel occlusion. 2. Intracranial atherosclerotic disease with multifocal stenoses, most notably as follows. 3. Mild/moderate stenosis at the origin of the M1 left middle cerebral artery. 4. Sites of moderate  stenosis within the proximal superior and inferior division left M2 MCA branches. 5. High-grade focal stenosis within a mid M2 inferior division left MCA branch vessel. 6. Severe stenosis within a proximal to mid superior division right M2 MCA branch vessel. 7. Moderate to moderately severe stenosis within the right PCA at the P2/P3 junction. 8. Sites of up to moderate stenosis within the P3 and more distal left PCA. Electronically Signed   By: Kellie Simmering DO   On: 09/17/2019 18:47   CT ABDOMEN PELVIS W CONTRAST  Result Date: 09/17/2019 CLINICAL DATA:  Left lower quadrant pain, hematuria EXAM: CT ABDOMEN AND PELVIS WITH CONTRAST TECHNIQUE: Multidetector CT imaging of the abdomen and pelvis was performed using the standard protocol following bolus administration of intravenous contrast. CONTRAST:  7mL OMNIPAQUE IOHEXOL 300 MG/ML  SOLN COMPARISON:  None. FINDINGS: Lower chest: No acute abnormality. Hepatobiliary: No focal lesion. Calcified gallstone. Dilatation of the common bile duct measuring up to 1.1 cm. There is tapering distally. Pancreas: Unremarkable. Spleen: Unremarkable. Adrenals/Urinary Tract: Mild diffuse thickening of the left adrenal. No hydronephrosis. 3 mm nonobstructing calculus of the lower pole of the left kidney. There is heterogeneous density within the bladder lumen. Mild wall thickening identified. Stomach/Bowel: Stomach is within normal limits.  Bowel is normal in caliber. Sigmoid diverticulosis. Normal appendix. Vascular/Lymphatic: Aortic atherosclerosis with calcified and irregular noncalcified plaque. The infrarenal aorta measures up to 2.9 cm. More proximal aorta measures 1.9 cm. No enlarged lymph nodes identified. Reproductive: No adnexal mass. There is a heterogeneous exophytic mass of the uterine fundus measuring approximately 8 x 12 x 11 cm. Other: No ascites. No significant abnormality of the abdominal wall. Musculoskeletal: Degenerative changes of the included spine. No acute  osseous abnormality. IMPRESSION: Heterogeneous density within the bladder lumen likely reflecting blood products. Mild wall thickening is present but no discrete mass is identified. Large exophytic mass of the uterine fundus measuring up to 12 cm. Small nonobstructing left renal calculus. Cholelithiasis. Mild dilatation of the proximal common bile duct with distal tapering. Sigmoid diverticulosis. Electronically Signed   By: Macy Mis M.D.   On: 09/17/2019 13:52     ASSESSMENT AND PLAN  69 y.o. female with past medical history significant for prior stroke in June 2021, intracranial and extracranial atherosclerotic disease with 80 to 99% left ICA stenosis on dual antiplatelet therapy with aspirin and Plavix, diabetes, hypertension, hyperlipidemia, known bladder cancer presents to the emergency department with weakness and gross hematuria resulting in symptomatic anemia with new left thalamic infarction.  Acute Ischemic Stroke   Recommend #Discuss with vascular surgery  for left ICA stenosis #We will defer decision on TEE with for stroke team #Continue aspirin #Start or continue Atorvastatin 40 mg/other high intensity statin # BP goal: permissive HTN upto 716 systolic  # HBAIC and Lipid profile # Telemetry monitoring # Frequent neuro checks # stroke swallow screen  Please page stroke NP  Or  PA  Or MD from 8am -4 pm  as this patient from this time will be  followed by the stroke.   You can look them up on [email or web address]  Password [facility]   Kaushik Maul [facility] Pager Number [phone]

## 2019-09-18 NOTE — Progress Notes (Addendum)
FPTS Interim Progress Note  Spoke to nurse regarding hematuria from known bladder cancer.  She has been unable to void on her own. Required irrigation of bladder overnight due to level of blood clots. Has continued to be unable to urinate overnight. May require foley catheter.  - nurse to repeat bladder scan this morning - Spoke with Dr. Junious Silk who plans to come see patient - 2U pRBC ordered given Hgb of 7.1 (transfusion threshold 8.0)  Danna Hefty, DO 09/18/2019, 8:22 AM PGY-3, [place] [facility] pager [phone]

## 2019-09-18 NOTE — Progress Notes (Signed)
FPTS Interim Progress Note  S: Reported to bedside in order to evaluate patient for calling out to her RN with pain after having urinary catheter placed. Patient denies any abdominal pain, chest pain, N/V but emphasizes her pain in her genital area. Reiterated the purpose of the catheter given her present symptoms and that we would order pain medication.   O: BP (!) 151/71   Pulse 80   Temp 98.6 F (37 C) (Oral)   Resp 20   Wt 80.5 kg   SpO2 99%   BMI 30.46 kg/m   Gen: female appearing stated age, uncomfortable appearing  Cardiac: RRR  Abd: minimal tenderness in central lower abdomen, bowel sounds normal, abd soft GU: bloody pad not saturated, no labial laceration appreciated, catheter tubing in urethra   A/P: Oxycodone 5 mg q 4 hours PRN for 4 doses ordered   Eulis Foster, MD 09/18/2019, 7:46 PM PGY-2, [facility] pager [phone]

## 2019-09-18 NOTE — Progress Notes (Addendum)
Patient calling out stating she is in severe pain since new catheter has been placed by urologist. Tylenol given, family medicine paged.   18:40: Urologist paged   18:52: Urology instructed to give B&O suppository for pain, order given. Will give medication once verified by pharmacy and continue to monitor patient

## 2019-09-18 NOTE — Progress Notes (Signed)
STROKE TEAM PROGRESS NOTE   INTERVAL HISTORY I have personally reviewed history of presenting illness with the patient, electronic medical records and imaging films in PACS.  She presented with generalized weakness in the setting of gross hematuria resulting in symptomatic anemia while on dual antiplatelet therapy following left carotid revascularization with TCAR 1 month ago.  MRI scan shows small left posterior medial thalamic infarct which is likely clinically silent.  MRI of the brain shows mild intracranial atherosclerotic changes.  Carotid ultrasound is pending  Vitals:   09/18/19 0700 09/18/19 0920 09/18/19 1000 09/18/19 1123  BP: (!) 155/65 (!) 163/60 (!) 153/61 (!) 159/81  Pulse: 99 89 80 89  Resp: 20 (!) 22  20  Temp: 99.1 F (37.3 C) 98.4 F (36.9 C) 98.4 F (36.9 C) 98 F (36.7 C)  TempSrc: Oral Oral Oral Axillary  SpO2:  99% 99% 99%  Weight:       CBC:  Recent Labs  Lab 09/17/19 0907 09/17/19 0907 09/17/19 2214 09/18/19 0421  WBC 13.5*  --   --  10.0  HGB 6.9*   < > 7.0* 7.1*  HCT 22.2*   < > 22.2* 22.6*  MCV 88.8  --   --  86.6  PLT 488*  --   --  351   < > = values in this interval not displayed.   Basic Metabolic Panel:  Recent Labs  Lab 09/17/19 0907 09/18/19 0421  NA 137 137  K 3.4* 3.1*  CL 97* 101  CO2 25 22  GLUCOSE 224* 124*  BUN 16 13  CREATININE 1.67* 0.69  CALCIUM 8.8* 8.5*   Lipid Panel:  Recent Labs  Lab 09/18/19 0421  CHOL 94  TRIG 82  HDL 32*  CHOLHDL 2.9  VLDL 16  LDLCALC 46   HgbA1c:  Recent Labs  Lab 09/18/19 0421  HGBA1C 6.5*   Urine Drug Screen: No results for input(s): LABOPIA, COCAINSCRNUR, LABBENZ, AMPHETMU, THCU, LABBARB in the last 168 hours.  Alcohol Level No results for input(s): ETH in the last 168 hours.  IMAGING past 24 hours CT Head Wo Contrast  Result Date: 09/17/2019 CLINICAL DATA:  Mental status change, unknown cause; history of cancer. EXAM: CT HEAD WITHOUT CONTRAST TECHNIQUE: Contiguous axial images  were obtained from the base of the skull through the vertex without intravenous contrast. COMPARISON:  Brain MRI 08/12/2019, head CT 08/12/2019 FINDINGS: Brain: Redemonstrated large, now late subacute to chronic, left MCA vascular territory infarct affecting portions of the frontal, parietal and temporal lobes as well as left insula. There is uniform cortical hyperdensity throughout the infarction territory likely reflecting cortical laminar necrosis. New from prior examinations, there is a lacunar infarct within the left lentiform nucleus which may be acute or subacute (series 3, image 20). Stable, mild generalized parenchymal atrophy. Stable background mild ill-defined hypoattenuation within the cerebral white matter which is nonspecific, but consistent with chronic small vessel ischemic disease. No extra-axial fluid collection. No evidence of intracranial mass. No midline shift. Vascular: No definite hyperdense vessel. Atherosclerotic calcifications. Partially visualized and within the cervical left ICA. Skull: Normal. Negative for fracture or focal lesion. Sinuses/Orbits: Visualized orbits show no acute finding. No significant paranasal sinus disease or mastoid effusion at the imaged levels. IMPRESSION: A lacunar infarct within the left lentiform nucleus is new as compared to the MRI of 08/12/2019, and may be acute or subacute. Redemonstrated large, now late subacute to chronic, left MCA vascular territory infarct. Uniform cortical hyperdensity throughout the infarction territory likely  reflects cortical laminar necrosis. Stable background mild generalized parenchymal atrophy and chronic small vessel ischemic disease. Electronically Signed   By: Kellie Simmering DO   On: 09/17/2019 13:57   MR ANGIO HEAD WO CONTRAST  Result Date: 09/17/2019 CLINICAL DATA:  Provided history: Brain mass or lesion. EXAM: MRI HEAD WITHOUT AND WITH CONTRAST MRA HEAD WITHOUT CONTRAST TECHNIQUE: Multiplanar, multiecho pulse sequences of  the brain and surrounding structures were obtained without and with intravenous contrast. Angiographic images of the head were obtained using MRA technique without contrast. CONTRAST:  20mL GADAVIST GADOBUTROL 1 MMOL/ML IV SOLN COMPARISON:  Head CT 09/17/2019, brain MRI 08/12/2019, CT angiogram head/neck 08/12/2019. FINDINGS: MRI HEAD FINDINGS Brain: The axial postcontrast T1 weighted imaging is mild to moderately motion degraded. Stable, mild generalized parenchymal atrophy. There is a 10 mm focus of restricted diffusion within the posteromedial left thalamus consistent with acute/early subacute infarction (series 9, image 74). Additionally, there is a subcentimeter focus of diffusion weighted hyperintensity without definite T2 shine through within the subcortical posterior left frontal lobe which which may reflect a small subacute infarct (series 9, image 83). Redemonstrated large late subacute/chronic left MCA vascular territory infarct affecting portions of the left frontal, parietal and temporal lobes as well as left insula and left caudate nucleus. There is cortical T1 hyperintensity and SWI signal loss consistent with chronic hemosiderin deposition and/or cortical laminar necrosis at this site. Additionally, there is probable subtle cortical enhancement at this site which is not unexpected for a late subacute infarct. Stable background mild scattered T2/FLAIR hyperintensity within the cerebral white matter and pons which is nonspecific, but consistent with chronic small vessel ischemic disease. A lacunar infarct within the left lentiform nucleus is new as compared to the prior MRI of 08/12/2019, but not acute. No evidence of intracranial mass. No extra-axial fluid collection. No midline shift. Vascular: Reported below. Skull and upper cervical spine: No focal marrow lesion. Sinuses/Orbits: Visualized orbits show no acute finding. Mild ethmoid sinus mucosal thickening. No significant mastoid effusion. MRA HEAD  FINDINGS The intracranial internal carotid arteries are patent with no more than mild atherosclerotic narrowing. The M1 middle cerebral arteries are patent. Mild/moderate stenosis at the origin of the M1 left MCA (series 1064, image 8). No left M2 proximal branch occlusion is identified. There are sites of moderate stenosis within the proximal superior and inferior division left M2 MCA branches. Additionally, there is a high-grade focal stenosis within a mid M2 inferior division left MCA branch (series 1070, image 4). Atherosclerotic irregularity of the M2 and more distal right MCA branches. Most notably, there is a severe stenosis within a proximal to mid superior division right M2 MCA branch (series 1070, image 12). The anterior cerebral arteries are patent without appreciable high-grade proximal stenosis. The intracranial vertebral arteries are patent without significant stenosis, as is the basilar artery. The posterior cerebral arteries are patent proximally. Redemonstrated moderate to moderately severe stenosis within the right PCA at the P2/P3 junction (series 1076, image 16). Sites of moderate stenosis within the P3 and more distal left PCA. A left posterior communicating artery is present. The right posterior communicating artery is hypoplastic or absent No intracranial aneurysm is identified. IMPRESSION: MRI brain: 1. Mild-to-moderate motion degradation of the axial T1 weighted postcontrast imaging. 2. 10 mm acute/early subacute infarct within the posteromedial left thalamus. 3. Subcentimeter suspected late subacute infarct within the subcortical posterior left frontal lobe. 4. A lacunar infarct within the left lentiform nucleus is new as compared to the prior MRI  of 08/12/2019, but not acute. 5. Redemonstrated large chronic left MCA territory infarct with associated chronic hemosiderin deposition and/or cortical laminar necrosis. Mild cortical enhancement is also present at this site, which is not  unexpected for a late subacute infarct. MRA head: 1. No intracranial large vessel occlusion. 2. Intracranial atherosclerotic disease with multifocal stenoses, most notably as follows. 3. Mild/moderate stenosis at the origin of the M1 left middle cerebral artery. 4. Sites of moderate stenosis within the proximal superior and inferior division left M2 MCA branches. 5. High-grade focal stenosis within a mid M2 inferior division left MCA branch vessel. 6. Severe stenosis within a proximal to mid superior division right M2 MCA branch vessel. 7. Moderate to moderately severe stenosis within the right PCA at the P2/P3 junction. 8. Sites of up to moderate stenosis within the P3 and more distal left PCA. Electronically Signed   By: Kellie Simmering DO   On: 09/17/2019 18:47   MR BRAIN W WO CONTRAST  Result Date: 09/17/2019 CLINICAL DATA:  Provided history: Brain mass or lesion. EXAM: MRI HEAD WITHOUT AND WITH CONTRAST MRA HEAD WITHOUT CONTRAST TECHNIQUE: Multiplanar, multiecho pulse sequences of the brain and surrounding structures were obtained without and with intravenous contrast. Angiographic images of the head were obtained using MRA technique without contrast. CONTRAST:  84mL GADAVIST GADOBUTROL 1 MMOL/ML IV SOLN COMPARISON:  Head CT 09/17/2019, brain MRI 08/12/2019, CT angiogram head/neck 08/12/2019. FINDINGS: MRI HEAD FINDINGS Brain: The axial postcontrast T1 weighted imaging is mild to moderately motion degraded. Stable, mild generalized parenchymal atrophy. There is a 10 mm focus of restricted diffusion within the posteromedial left thalamus consistent with acute/early subacute infarction (series 9, image 74). Additionally, there is a subcentimeter focus of diffusion weighted hyperintensity without definite T2 shine through within the subcortical posterior left frontal lobe which which may reflect a small subacute infarct (series 9, image 83). Redemonstrated large late subacute/chronic left MCA vascular territory  infarct affecting portions of the left frontal, parietal and temporal lobes as well as left insula and left caudate nucleus. There is cortical T1 hyperintensity and SWI signal loss consistent with chronic hemosiderin deposition and/or cortical laminar necrosis at this site. Additionally, there is probable subtle cortical enhancement at this site which is not unexpected for a late subacute infarct. Stable background mild scattered T2/FLAIR hyperintensity within the cerebral white matter and pons which is nonspecific, but consistent with chronic small vessel ischemic disease. A lacunar infarct within the left lentiform nucleus is new as compared to the prior MRI of 08/12/2019, but not acute. No evidence of intracranial mass. No extra-axial fluid collection. No midline shift. Vascular: Reported below. Skull and upper cervical spine: No focal marrow lesion. Sinuses/Orbits: Visualized orbits show no acute finding. Mild ethmoid sinus mucosal thickening. No significant mastoid effusion. MRA HEAD FINDINGS The intracranial internal carotid arteries are patent with no more than mild atherosclerotic narrowing. The M1 middle cerebral arteries are patent. Mild/moderate stenosis at the origin of the M1 left MCA (series 1064, image 8). No left M2 proximal branch occlusion is identified. There are sites of moderate stenosis within the proximal superior and inferior division left M2 MCA branches. Additionally, there is a high-grade focal stenosis within a mid M2 inferior division left MCA branch (series 1070, image 4). Atherosclerotic irregularity of the M2 and more distal right MCA branches. Most notably, there is a severe stenosis within a proximal to mid superior division right M2 MCA branch (series 1070, image 12). The anterior cerebral arteries are patent without appreciable  high-grade proximal stenosis. The intracranial vertebral arteries are patent without significant stenosis, as is the basilar artery. The posterior cerebral  arteries are patent proximally. Redemonstrated moderate to moderately severe stenosis within the right PCA at the P2/P3 junction (series 1076, image 16). Sites of moderate stenosis within the P3 and more distal left PCA. A left posterior communicating artery is present. The right posterior communicating artery is hypoplastic or absent No intracranial aneurysm is identified. IMPRESSION: MRI brain: 1. Mild-to-moderate motion degradation of the axial T1 weighted postcontrast imaging. 2. 10 mm acute/early subacute infarct within the posteromedial left thalamus. 3. Subcentimeter suspected late subacute infarct within the subcortical posterior left frontal lobe. 4. A lacunar infarct within the left lentiform nucleus is new as compared to the prior MRI of 08/12/2019, but not acute. 5. Redemonstrated large chronic left MCA territory infarct with associated chronic hemosiderin deposition and/or cortical laminar necrosis. Mild cortical enhancement is also present at this site, which is not unexpected for a late subacute infarct. MRA head: 1. No intracranial large vessel occlusion. 2. Intracranial atherosclerotic disease with multifocal stenoses, most notably as follows. 3. Mild/moderate stenosis at the origin of the M1 left middle cerebral artery. 4. Sites of moderate stenosis within the proximal superior and inferior division left M2 MCA branches. 5. High-grade focal stenosis within a mid M2 inferior division left MCA branch vessel. 6. Severe stenosis within a proximal to mid superior division right M2 MCA branch vessel. 7. Moderate to moderately severe stenosis within the right PCA at the P2/P3 junction. 8. Sites of up to moderate stenosis within the P3 and more distal left PCA. Electronically Signed   By: Kellie Simmering DO   On: 09/17/2019 18:47   CT ABDOMEN PELVIS W CONTRAST  Result Date: 09/17/2019 CLINICAL DATA:  Left lower quadrant pain, hematuria EXAM: CT ABDOMEN AND PELVIS WITH CONTRAST TECHNIQUE: Multidetector CT  imaging of the abdomen and pelvis was performed using the standard protocol following bolus administration of intravenous contrast. CONTRAST:  82mL OMNIPAQUE IOHEXOL 300 MG/ML  SOLN COMPARISON:  None. FINDINGS: Lower chest: No acute abnormality. Hepatobiliary: No focal lesion. Calcified gallstone. Dilatation of the common bile duct measuring up to 1.1 cm. There is tapering distally. Pancreas: Unremarkable. Spleen: Unremarkable. Adrenals/Urinary Tract: Mild diffuse thickening of the left adrenal. No hydronephrosis. 3 mm nonobstructing calculus of the lower pole of the left kidney. There is heterogeneous density within the bladder lumen. Mild wall thickening identified. Stomach/Bowel: Stomach is within normal limits. Bowel is normal in caliber. Sigmoid diverticulosis. Normal appendix. Vascular/Lymphatic: Aortic atherosclerosis with calcified and irregular noncalcified plaque. The infrarenal aorta measures up to 2.9 cm. More proximal aorta measures 1.9 cm. No enlarged lymph nodes identified. Reproductive: No adnexal mass. There is a heterogeneous exophytic mass of the uterine fundus measuring approximately 8 x 12 x 11 cm. Other: No ascites. No significant abnormality of the abdominal wall. Musculoskeletal: Degenerative changes of the included spine. No acute osseous abnormality. IMPRESSION: Heterogeneous density within the bladder lumen likely reflecting blood products. Mild wall thickening is present but no discrete mass is identified. Large exophytic mass of the uterine fundus measuring up to 12 cm. Small nonobstructing left renal calculus. Cholelithiasis. Mild dilatation of the proximal common bile duct with distal tapering. Sigmoid diverticulosis. Electronically Signed   By: Macy Mis M.D.   On: 09/17/2019 13:52    PHYSICAL EXAM Pleasant mildly obese middle-aged Caucasian lady not in distress. . Afebrile. Head is nontraumatic. Neck is supple without bruit.    Cardiac exam no  murmur or gallop. Lungs are  clear to auscultation. Distal pulses are well felt. Neurological Exam :  She is awake alert oriented to time place and person.  Speech is mostly fluent with occasional hesitancy.  No dysarthria.  Good comprehension naming repetition.  Extraocular movements full range without nystagmus.  She blinks to threat bilaterally.  Mild right lower facial weakness.  Tongue midline.  Motor system exam shows symmetric upper and lower extremity strength without drift.  Slightly diminished fine finger movements on the right and orbits left over right upper extremity.  Touch and pinprick sensation is symmetric bilaterally.  Plantars are downgoing.  Gait not tested.  ASSESSMENT/PLAN [name] is a 69 y.o. female with history of prior stroke in June 2021, intracranial and extracranial atherosclerotic disease with 80 to 99% left ICA stenosis on dual antiplatelet therapy with aspirin and Plavix, diabetes, hypertension, hyperlipidemia, known bladder cancer presenting with weakness and gross hematuria resulting in symptomatic anemia.   Stroke:   L thalamic infarct in setting of recent large L MCA infarct, L ICA large vessel dz and symptomatic hematuria requiring transfusion while on dual antiplatelet therapy following left carotid TCAR procedure a month ago  CT head L lentiform nucleus lacunar infarct, new from MRI 08/12/2019. Recent L MCA territory infarct w/ cortical laminar necrosis. Small vessel disease. Atrophy.   MRI  Acute/subacute posteromedial L thalamic infarct. Late subacute subcentimeter subcortical posterior L frontal lobe infarct. L lentiform nucleus lacune new since 08/12/19. chronic large L MCA territory infarct w/ mild cortical enhancement  MRA  No LVO. Multifocal stenoses:  L M1 mild / moderate, proximal superior and inferior division L M2 branches moderate, mid L M2 inferior division high-grade focal, proximal to mid superior division R M2 severe, R PCA at P2/P3 jxn moderate to moderate severe, L  P3 and distal LPCA moderate    2D Echo pending   LDL 158  HgbA1c 8.2  VTE prophylaxis - SCDs   aspirin 81 mg daily and clopidogrel 75 mg daily prior to admission, now on off antithrombotics given hematuria requiring transfusion.   Therapy recommendations:  pending   Disposition:  pending   Carotid Stenosis, Left  S/p left TCAR w/ stent 7/8 Donz[facility] Matters). d/c on aspirin, plavix and statin.   Multifocal intracranial stenosis  CTA head slow flow L M2 and distal L MCA vessels w/ superior and inferior division proximal L M2 branch occlusions. L M1 moderate stenosis. R M2 proximal to superior division moderate to severe focal stenosis. R A2 distal moderate focal stenosis. R P2/P3 high-grade focal stenosis. L P2/P3 moderate severe focal stenosis.  MRA  No LVO. Multifocal stenoses:  L M1 mild / moderate, proximal superior and inferior division L M2 branches moderate, mid L M2 inferior division high-grade focal, proximal to mid superior division R M2 severe, R PCA at P2/P3 jxn moderate to moderate severe, L P3 and distal LPCA moderate    On DAPT PTA  now off given hematuria  Avoid low BP  AV mobile density  2D Echo linear mobile density 0.6cm on aortic valve. EF 60-65%   TEE recommended  Last admission, pt wanted to do as outpt. Not yet done  Repeat 2D pending   History of Stroke  07/2019 - L MCA infarct due to left ICA bulb high grade stenosis and L M2 occlusion, secondary to large vessel disease source DAPT x 3 mos. 2D Echo linear mobile density 0.6cm on aortic valve. EF 60-65%  TEE recommended, pt wants to  do as outpt  Hypertension  Stable . Permissive hypertension (OK if < 220/120)  . SBP goal 130-150 . 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  She has no clear focal symptoms suggestive of a stroke but MRI does show a small left thalamic infarct likely from small vessel disease.  Agree with holding antiplatelet therapy till her hematuria resolves and then perhaps considering only single agent therapy with aspirin 81.  Check carotid ultrasound to look for patency of left carotid stent though I think the left thalamic infarct is unrelated to the.  Maintain aggressive risk factor modification with strict control of hypertension with blood pressure goal below 130/90, lipids with LDL cholesterol goal below 70 mg percent and diabetes with hemoglobin A1c goal below 6.5%.  Long discussion with patient and answered questions.  Greater than 50% time during this 35-minute visit was spent on counseling and coordination of care about her incidental left thalamic lacunar infarct and recent left carotid TCAR procedure in hematuria on dual antiplatelet therapy and answering questions Antony Contras, MD  To contact Stroke Continuity provider, please refer to [email or web address] After hours, contact General Neurology

## 2019-09-18 NOTE — Progress Notes (Signed)
  Echocardiogram 2D Echocardiogram has been attempted. Physician consult in progress. Will reattempt at later time.  Lisa [name] 09/18/2019, 11:52 AM

## 2019-09-18 NOTE — Progress Notes (Signed)
Attempted to irrigate the bladder but the saline comes right back through the vagina, pt uncomfortable, page on Call MD, and gave verbal order to change the foley to 67F, will implement the order.

## 2019-09-18 NOTE — Progress Notes (Signed)
In to examine patient and check in on patient. Dark urine in Foley bag, burgundy in color. Reports no specific concerns. Asked if I could update family, she requests I call Rod Holler (listed in chart) who is with husband.  - Attempted to call both Rod Holler (listed in chart) and patient's husband. Left only generic voicemail. Will attempt again later today.

## 2019-09-18 NOTE — Evaluation (Signed)
Speech Language Pathology Evaluation Patient Details Name: Lisa [name] MRN: [id number] DOB: 12-12-1950 Today's Date: 09/18/2019 Time: 7209-4709 SLP Time Calculation (min) (ACUTE ONLY): 8 min  Problem List:  Patient Active Problem List   Diagnosis Date Noted  . 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 Lacunar infarct, acute ([place]) 09/17/2019  . Acute blood loss anemia 09/17/2019  . Pressure injury of skin 09/07/2019  . Gross hematuria 09/01/2019  . Hematuria 08/31/2019  . 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 Stenosis of carotid artery   . Abnormality of aortic valve   . DM (diabetes mellitus), type 2, uncontrolled w/neurologic complication ([place])   . Hypercholesteremia   . CVA (cerebral vascular accident) ([place]) 08/12/2019  . Acute ischemic stroke ([place])   . Hypertension    Past Medical History:  Past Medical History:  Diagnosis Date  . Carotid arterial disease ([place]) 07/2019  . Diabetes ([place])   . HTN (hypertension)   . Hyperlipidemia   . Stroke [facility])    acute/subacute left MCA infarct 08/12/19   Past Surgical History:  Past Surgical History:  Procedure Laterality Date  . CYSTOSCOPY WITH FULGERATION N/A 09/01/2019   Procedure: CYSTOSCOPY CLOT EVACUATION WITH POSSIBLE FULGERATION;  Surgeon: Robley Fries, MD;  Location: [facility];  Service: Urology;  Laterality: N/A;  . TONSILLECTOMY    . TRANSCAROTID ARTERY REVASCULARIZATION Left 08/20/2019   Procedure: LEFT TRANSCAROTID ARTERY REVASCULARIZATION;  Surgeon: Waynetta Sandy, MD;  Location: [facility];  Service: Vascular;  Laterality: Left;   HPI:  Lisa [name] is a 69 y.o. female presenting with gross hematuria and symptomatic anemic in the setting of papillary carcinoma of the bladder.  PMH significant for recent ischemic CVA, recent carotid stent placement, HTN, HLD, DM2.  Pt known to this service from recent stroke with Broca's aphasia noted at that  time.  MRI 8/5: "1. Mild-to-moderate motion degradation of the axial T1 weightedpostcontrast imaging. 2. 10 mm acute/early subacute infarct within the posteromedial left thalamus. 3. Subcentimeter suspected late subacute infarct within the subcortical posterior left frontal lobe. 4. A lacunar infarct within the left lentiform nucleus is new as compared to the prior MRI of 08/12/2019, but not acute. 5. Redemonstrated large chronic left MCA territory infarct with associated chronic hemosiderin deposition and/or cortical laminar necrosis. Mild cortical enhancement is also present at this site, which is not unexpected for a late subacute infarct."   Assessment / Plan / Recommendation Clinical Impression  Pt presents with functional swallowing as assessed clinically. There were no clinical s/s of aspiration with any consistencies trialed and pt exhibited good oral clearance of puree and solid textures.  Recommend continuing regular texture diet with thin liquid.    SLP Assessment  SLP Recommendation/Assessment: Patient needs continued Speech Lanaguage Pathology Services SLP Visit Diagnosis: Dysphagia, unspecified (R13.10)    Follow Up Recommendations  Outpatient SLP;Home health SLP    Frequency and Duration  (N/A)  2 weeks      SLP Evaluation Cognition  Overall Cognitive Status: No family/caregiver present to determine baseline cognitive functioning       Comprehension  Auditory Comprehension Overall Auditory Comprehension: Appears within functional limits for tasks assessed Yes/No Questions: Within Functional Limits Commands: Within Functional Limits Conversation: Simple Reading Comprehension Reading Status: Not tested    Expression Expression Primary Mode of Expression: Verbal Verbal Expression Overall Verbal Expression: Impaired at baseline Initiation: No impairment Repetition: Impaired Level of Impairment: Word level Naming: Impairment Confrontation: Within  functional  limits Verbal Errors: Semantic paraphasias;Aware of errors Effective Techniques: Phonemic cues;Semantic cues;Sentence completion Written Expression Dominant Hand: Right Written Expression: Not tested   Oral / Motor  Oral Motor/Sensory Function Overall Oral Motor/Sensory Function: Mild impairment Facial ROM:  (unable to test) Lingual ROM: Within Functional Limits Lingual Symmetry: Within Functional Limits Lingual Strength: Within Functional Limits Velum: Within Functional Limits Mandible: Within Functional Limits   [place], [place], [place] Office: [phone]  09/18/2019, 12:20 PM

## 2019-09-18 NOTE — Progress Notes (Signed)
PT Cancellation Note  Patient Details Name: Lisa [name] MRN: [id number] DOB: 06-Mar-1950   Cancelled Treatment:    Reason Eval/Treat Not Completed: Fatigue limiting ability to participate  Patient adamant that she is not going to do PT today as she is too tired. Requested PT return 09/19/19   Arby Barrette, PT Pager [phone]    Lisa [name] 09/18/2019, 2:45 PM

## 2019-09-18 NOTE — Progress Notes (Addendum)
Family Medicine Teaching Service Daily Progress Note Intern Pager: [phone]  Patient name: Lisa [name] Medical record number: [id number] Date of birth: 18-Sep-1950 Age: 69 y.o. Gender: female  Primary Care Provider: Patient, No Pcp Per Consultants: Neuro Code Status: FULL  Pt Overview and Major Events to Date:  Admitted 8/5  Assessment and Plan: Lisa Anfinson Shupingis a 69 y.o. female presenting with gross hematuria and symptomatic anemic in the setting of papillary carcinoma of the bladder.PMH significant for recent ischemic CVA, recent carotid stent placement, HTN, HLD, DM2.  Symptomatic anemia 2/2 papillary carcinoma of the bladder Admission Hgb 6.9 > 1U PRBC > repeat H&H 7. Hbg this AM 7.1. ASA and Plavix held. Bladder scan overnight 866mL, patient unable to void. In and out cath x1. Per nurse, patient with clots in urine and required irrigation of bladder overnight.  - S/p 1U PRBC's x1 - Ordered 2U PRBCs, transfusion threshold 8 - F/u H&H post-transfusion - Continue NS 131mL/hr - Repeat bladder scan. Consider foley cath - Consult Urology - Vitals per floor protocol.  - Holding Plavix and ASA - Daily CBCs, BMP - Continuous cardiac monitoring and pulse ox - Up with assistance - Fall precautions - Strict I's and O's - PT/OT consult  - SCD's for VTE PPx  New Lacunar Stroke on Head CT, not candidate for tPA  Hx left MCA infarct 08/12/19 CT Head significant for a lacunar infarct within the left lentiform nucleus, acute or subacute. MRI/MRA revealing intracranial atherosclerotic disease with multifocal stenoses, 10 mm acute/early subacute infarct within the posteromedial left Thalamus, subcentimeter suspected late subacute infarct within the subcortical posterior left frontal lobe, and a lacunar infarct within the left lentiform nucleus (see additional information below). ASA and Plavix held due to H&H 7 s/p 1U PRBC's.  - Neuro consulted, appreciate recommendations - Consult  vascular surgery  - Holding Plavix and ASA - TEE pending stroke team recommendation  - Holding home BP medications to allow for permissive hypertension   AKI likely 2/2 hypovolemia: stable, resolved Creatinine on admission of 1.67 up from 0.64 on 09/07/2019. This AM improved to 0.69.  - s/p NS bolus x1 - Continue IV hydration 100 mL/hr - Daily BMP - Holding home nephrotoxic medications at this time  - Bladder scans q8h   Hx of recent carotidstent  Patient with carotid stent placement by Dr. Donzetta Matters from vascular surgery on 08/20/2019. Patient was continued on her aspirin and Plavix for this. - Continue to hold Plavix and ASA - Consult Vascular Surgery as above  Hypertension AM BP: 155/65. Holding all hypertensive medications to allow for permissive HTN at this time, up to 481 systolic.  - Hold home medications: losartan 25 mg qd, metoprolol 25mg  BID, amlodipine 10 mg qd - Continue to monitor bloodpressure  Hyperlipidemia Patient had a panel performed on 7/1 which resulted in total cholesterol of 229, HDL 38, LDL 158, triglycerides 164.  - Atorvastatin 40 mg   Type 2 diabetes mellitus Hemoglobin A1c on 7/1 was 8.2. Patient was discharged on Metformin 500 mg daily. CBG's 124-136 overnight - Holding home Metformin  - sSSI, has not required - daily CBG's  FEN/GI: Carb modified diet Prophylaxis: SCD's  Disposition: Inpatient   Subjective:  Lisa [name] is seen in bed today, was sleeping prior to our interaction. States "I am just out of it". She feels very tired but denies chest pain or shortness of breath. She does not recall any hematuria. No abdominal pains. She   Objective: Temp:  [97.6  F (36.4 C)-99.1 F (37.3 C)] 98.4 F (36.9 C) (08/06 0349) Pulse Rate:  [81-102] 86 (08/06 0349) Resp:  [13-24] 18 (08/06 0349) BP: (105-148)/(56-84) 145/63 (08/06 0349) SpO2:  [97 %-100 %] 98 % (08/06 0349) Weight:  [80.5 kg] 80.5 kg (08/05 2242) Physical Exam: General: Awake,  alert and oriented x4 but with difficult word finding Cardiovascular: systolic murmur, regular rate Respiratory: CTAB Abdomen: soft, non-tender in all quadrants, normoactive bowel sounds Extremities: trace edema b/l, 2+ radial  Neuro: CN2-12 grossly intact, difficulty with word finding. 5/5 strength, moves all extremities spontaneously  Laboratory: Recent Labs  Lab 09/17/19 0907 09/17/19 2214 09/18/19 0421  WBC 13.5*  --  10.0  HGB 6.9* 7.0* 7.1*  HCT 22.2* 22.2* 22.6*  PLT 488*  --  351   Recent Labs  Lab 09/17/19 0907 09/18/19 0421  NA 137 137  K 3.4* 3.1*  CL 97* 101  CO2 25 22  BUN 16 13  CREATININE 1.67* 0.69  CALCIUM 8.8* 8.5*  PROT  --  5.1*  BILITOT  --  1.1  ALKPHOS  --  67  ALT  --  11  AST  --  16  GLUCOSE 224* 124*    Imaging/Diagnostic Tests: MRI brain 8/5:  1. Mild-to-moderate motion degradation of the axial T1 weighted postcontrast imaging. 2. 10 mm acute/early subacute infarct within the posteromedial left thalamus. 3. Subcentimeter suspected late subacute infarct within the subcortical posterior left frontal lobe. 4. A lacunar infarct within the left lentiform nucleus is new as compared to the prior MRI of 08/12/2019, but not acute. 5. Redemonstrated large chronic left MCA territory infarct with associated chronic hemosiderin deposition and/or cortical laminar necrosis. Mild cortical enhancement is also present at this site, which is not unexpected for a late subacute infarct.  MRA head 8/5:  1. No intracranial large vessel occlusion. 2. Intracranial atherosclerotic disease with multifocal stenoses, most notably as follows. 3. Mild/moderate stenosis at the origin of the M1 left middle cerebral artery. 4. Sites of moderate stenosis within the proximal superior and inferior division left M2 MCA branches. 5. High-grade focal stenosis within a mid M2 inferior division left MCA branch vessel. 6. Severe stenosis within a proximal to mid  superior division right M2 MCA branch vessel. 7. Moderate to moderately severe stenosis within the right PCA at the P2/P3 junction. 8. Sites of up to moderate stenosis within the P3 and more distal left PCA.  Sharion Settler, DO 09/18/2019, 6:16 AM PGY-1, [place] Intern pager: [phone], text pages welcome

## 2019-09-18 NOTE — Progress Notes (Signed)
2 units RBC transfusion complete. Post vital signs stable, all VS documentation in flow sheet.

## 2019-09-18 NOTE — Procedures (Signed)
Echo attempted. Patient refused test.

## 2019-09-18 NOTE — Progress Notes (Signed)
FPTS Interim Progress Note  S:went to pt bedside to assess pt.  She is complaining of abdominal pain going on throughout the day.  She is asking some medicine for the pain and states that she has not received the suppository yet.  Confirmed this with her nurse.  Pt's nurse states that urology placed her catheter sometime before his shift change and it has had minimal output since then.  Asked nurse to bladder scan pt, which was estimated to be 33ml.    O: BP (!) 151/97 (BP Location: Left Arm)   Pulse 100   Temp 98.4 F (36.9 C) (Oral)   Resp 20   Wt 80.5 kg   SpO2 99%   BMI 30.46 kg/m   Gen: alert.  CV: RRR.  Abd: TTP in the suprapubic region.   GU: urethral foley inserted.  Moderate amount of bleeding seen surrounding the insertion site. Absorbant pad placed underneath is partially soaked with blood and possibly urine.    A/P: Hematuria - pt has moderate amount of bleeding surrounding the site of insertion of the foley and minimal bloody output in the foley reservoir. No obvious clotting in the tubing seen, but it appears at some point along the catheter there is clotting or obstruction that is preventing flow of urine into foley bag.  Blood/urine mixture appears to be coming from the space between the urethra and the foley catheter. Per the urologist's note earlier today, nursing staff can perform foley irrigation as needed.  Asked nursing staff to irrigate catheter.  If still no improvement will notify urology about clotting issues.  For the pt's pain, she will be getting belladonna and opium suppository shortly, so we will hold off on adding another opioid medication at this time due to concern for sedation and delirium side effects of excessive opioids.    [name], MD 09/18/2019, 10:19 PM PGY-3, [facility] Medicine Service pager [phone]

## 2019-09-18 NOTE — Consult Note (Signed)
Urology Consult   Physician requesting consult: Dorris Singh, MD  Reason for consult: Gross hematuria  History of Present Illness: Lisa [name] is a 69 y.o. female with history of CVA s/p endovascular stent 7/8 on ASA and plavix for whom urology was recently consulted on 7/19 for gross hematuria. She underwent transurethral resection of bladder tumor and clot evacuation on 7/20. Pathology notable for HGT1 urothelial carcinoma, muscle present and non-involved. She remained hospitalized following this procedure, with plans for outpatient re-resection TURBT.   Urology is consulted again today as patient was readmitted yesterday with concerns for acute ischemic stroke, and patient was having gross hematuria with inability to urinate. Hb was noted to be 7.1, and she was transfused 2 units pRBCs. Vascular surgery would recommend initiation of ASA and plavix, but plavix has been held in the setting of gross hematuria.   A 14Fr foley catheter was placed this morning and has been draining dark merlot colored urine. Bladder scan with catheter in place was 160cc. Patient denies discomfort. She has been hemodynamically stable.    Past Medical History:  Diagnosis Date  . Carotid arterial disease ([place]) 07/2019  . Diabetes ([place])   . HTN (hypertension)   . Hyperlipidemia   . Stroke [facility])    acute/subacute left MCA infarct 08/12/19    Past Surgical History:  Procedure Laterality Date  . CYSTOSCOPY WITH FULGERATION N/A 09/01/2019   Procedure: CYSTOSCOPY CLOT EVACUATION WITH POSSIBLE FULGERATION;  Surgeon: Robley Fries, MD;  Location: [place];  Service: Urology;  Laterality: N/A;  . TONSILLECTOMY    . TRANSCAROTID ARTERY REVASCULARIZATION Left 08/20/2019   Procedure: LEFT TRANSCAROTID ARTERY REVASCULARIZATION;  Surgeon: Waynetta Sandy, MD;  Location: [place];  Service: Vascular;  Laterality: Left;     Current Hospital Medications:  Home meds:  No current facility-administered  medications on file prior to encounter.   Current Outpatient Medications on File Prior to Encounter  Medication Sig Dispense Refill  . amLODipine (NORVASC) 10 MG tablet Take 1 tablet (10 mg total) by mouth daily. 30 tablet 0  . aspirin EC 81 MG EC tablet Take 1 tablet (81 mg total) by mouth daily. Swallow whole. 30 tablet 0  . atorvastatin (LIPITOR) 80 MG tablet Take 1 tablet (80 mg total) by mouth daily. 30 tablet 0  . clopidogrel (PLAVIX) 75 MG tablet Take 1 tablet (75 mg total) by mouth daily. 30 tablet 0  . losartan (COZAAR) 25 MG tablet Take 1 tablet (25 mg total) by mouth daily. 30 tablet 0  . metFORMIN (GLUCOPHAGE XR) 500 MG 24 hr tablet Take 1 tablet (500 mg total) by mouth daily with breakfast. 30 tablet 0  . metoprolol tartrate (LOPRESSOR) 25 MG tablet Take 1 tablet (25 mg total) by mouth 2 (two) times daily. 60 tablet 3  . MULTIPLE VITAMIN PO Take 1 tablet by mouth daily.       Scheduled Meds: .  stroke: mapping our early stages of recovery book   Does not apply Once  . aspirin EC  81 mg Oral Daily  . atorvastatin  40 mg Oral Daily  . Chlorhexidine Gluconate Cloth  6 each Topical Daily  . insulin aspart  0-9 Units Subcutaneous TID WC  . potassium chloride  40 mEq Oral Q4H   Continuous Infusions: . sodium chloride 100 mL/hr at 09/18/19 0648   PRN Meds:.acetaminophen **OR** acetaminophen, bisacodyl, polyethylene glycol  Allergies: No Known Allergies  Family History  Problem Relation Age of Onset  . Hypertension  Mother   . Lung cancer Father     Social History:  reports that she quit smoking about 7 weeks ago. Her smoking use included cigarettes. She smoked 0.50 packs per day. She has never used smokeless tobacco. She reports previous alcohol use. She reports that she does not use drugs.  ROS: A complete review of systems was performed.  All systems are negative except for pertinent findings as noted.  Physical Exam:  Vital signs in last 24 hours: Temp:  [98 F (36.7  C)-99.1 F (37.3 C)] 98.4 F (36.9 C) (08/06 1400) Pulse Rate:  [80-99] 88 (08/06 1400) Resp:  [13-24] 20 (08/06 1145) BP: (105-163)/(58-81) 151/71 (08/06 1400) SpO2:  [97 %-100 %] 99 % (08/06 1400) Weight:  [80.5 kg] 80.5 kg (08/05 2242) Constitutional:  Alert and oriented, No acute distress Cardiovascular: Regular rate and rhythm, No JVD Respiratory: Normal respiratory effort GI: Abdomen is soft, nontender, nondistended, no abdominal masses GU: 14 Fr Foley catheter in place draining merlot colored urine Lymphatic: No lymphadenopathy Neurologic: Grossly intact, no focal deficits Psychiatric: Normal mood and affect  Laboratory Data:  Recent Labs    09/17/19 0907 09/17/19 2214 09/18/19 0421  WBC 13.5*  --  10.0  HGB 6.9* 7.0* 7.1*  HCT 22.2* 22.2* 22.6*  PLT 488*  --  351    Recent Labs    09/17/19 0907 09/18/19 0421  NA 137 137  K 3.4* 3.1*  CL 97* 101  GLUCOSE 224* 124*  BUN 16 13  CALCIUM 8.8* 8.5*  CREATININE 1.67* 0.69     Results for orders placed or performed during the hospital encounter of 09/17/19 (from the past 24 hour(s))  Hemoglobin and hematocrit, blood     Status: Abnormal   Collection Time: 09/17/19 10:14 PM  Result Value Ref Range   Hemoglobin 7.0 (L) 12.0 - 15.0 g/dL   HCT 22.2 (L) 36 - 46 %  SARS Coronavirus 2 by RT PCR (hospital order, performed in [facility] hospital lab) Nasopharyngeal Nasopharyngeal Swab     Status: None   Collection Time: 09/17/19 10:35 PM   Specimen: Nasopharyngeal Swab  Result Value Ref Range   SARS Coronavirus 2 NEGATIVE NEGATIVE  Glucose, capillary     Status: Abnormal   Collection Time: 09/17/19 11:25 PM  Result Value Ref Range   Glucose-Capillary 136 (H) 70 - 99 mg/dL  Urinalysis, Routine w reflex microscopic Urine, Catheterized     Status: Abnormal   Collection Time: 09/18/19  1:29 AM  Result Value Ref Range   Color, Urine RED (A) YELLOW   APPearance TURBID (A) CLEAR   Specific Gravity, Urine  1.005 -  1.030    TEST NOT REPORTED DUE TO COLOR INTERFERENCE OF URINE PIGMENT   pH  5.0 - 8.0    TEST NOT REPORTED DUE TO COLOR INTERFERENCE OF URINE PIGMENT   Glucose, UA (A) NEGATIVE mg/dL    TEST NOT REPORTED DUE TO COLOR INTERFERENCE OF URINE PIGMENT   Hgb urine dipstick (A) NEGATIVE    TEST NOT REPORTED DUE TO COLOR INTERFERENCE OF URINE PIGMENT   Bilirubin Urine (A) NEGATIVE    TEST NOT REPORTED DUE TO COLOR INTERFERENCE OF URINE PIGMENT   Ketones, ur (A) NEGATIVE mg/dL    TEST NOT REPORTED DUE TO COLOR INTERFERENCE OF URINE PIGMENT   Protein, ur (A) NEGATIVE mg/dL    TEST NOT REPORTED DUE TO COLOR INTERFERENCE OF URINE PIGMENT   Nitrite (A) NEGATIVE    TEST NOT REPORTED DUE  TO COLOR INTERFERENCE OF URINE PIGMENT   Leukocytes,Ua (A) NEGATIVE    TEST NOT REPORTED DUE TO COLOR INTERFERENCE OF URINE PIGMENT  Urinalysis, Microscopic (reflex)     Status: Abnormal   Collection Time: 09/18/19  1:29 AM  Result Value Ref Range   RBC / HPF >50 0 - 5 RBC/hpf   WBC, UA >50 0 - 5 WBC/hpf   Bacteria, UA MANY (A) NONE SEEN   Squamous Epithelial / LPF 0-5 0 - 5  Comprehensive metabolic panel     Status: Abnormal   Collection Time: 09/18/19  4:21 AM  Result Value Ref Range   Sodium 137 135 - 145 mmol/L   Potassium 3.1 (L) 3.5 - 5.1 mmol/L   Chloride 101 98 - 111 mmol/L   CO2 22 22 - 32 mmol/L   Glucose, Bld 124 (H) 70 - 99 mg/dL   BUN 13 8 - 23 mg/dL   Creatinine, Ser 0.69 0.44 - 1.00 mg/dL   Calcium 8.5 (L) 8.9 - 10.3 mg/dL   Total Protein 5.1 (L) 6.5 - 8.1 g/dL   Albumin 2.4 (L) 3.5 - 5.0 g/dL   AST 16 15 - 41 U/L   ALT 11 0 - 44 U/L   Alkaline Phosphatase 67 38 - 126 U/L   Total Bilirubin 1.1 0.3 - 1.2 mg/dL   GFR calc non Af Amer >60 >60 mL/min   GFR calc Af Amer >60 >60 mL/min   Anion gap 14 5 - 15  CBC     Status: Abnormal   Collection Time: 09/18/19  4:21 AM  Result Value Ref Range   WBC 10.0 4.0 - 10.5 K/uL   RBC 2.61 (L) 3.87 - 5.11 MIL/uL   Hemoglobin 7.1 (L) 12.0 - 15.0 g/dL    HCT 22.6 (L) 36 - 46 %   MCV 86.6 80.0 - 100.0 fL   MCH 27.2 26.0 - 34.0 pg   MCHC 31.4 30.0 - 36.0 g/dL   RDW 14.9 11.5 - 15.5 %   Platelets 351 150 - 400 K/uL   nRBC 0.0 0.0 - 0.2 %  Lipid panel     Status: Abnormal   Collection Time: 09/18/19  4:21 AM  Result Value Ref Range   Cholesterol 94 0 - 200 mg/dL   Triglycerides 82 <150 mg/dL   HDL 32 (L) >40 mg/dL   Total CHOL/HDL Ratio 2.9 RATIO   VLDL 16 0 - 40 mg/dL   LDL Cholesterol 46 0 - 99 mg/dL  Hemoglobin A1c     Status: Abnormal   Collection Time: 09/18/19  4:21 AM  Result Value Ref Range   Hgb A1c MFr Bld 6.5 (H) 4.8 - 5.6 %   Mean Plasma Glucose 139.85 mg/dL  Glucose, capillary     Status: Abnormal   Collection Time: 09/18/19  6:23 AM  Result Value Ref Range   Glucose-Capillary 132 (H) 70 - 99 mg/dL  Prepare RBC (crossmatch)     Status: None   Collection Time: 09/18/19  8:16 AM  Result Value Ref Range   Order Confirmation      ORDER PROCESSED BY BLOOD BANK Performed at [facility], [address] [place], [place] [address]   Glucose, capillary     Status: Abnormal   Collection Time: 09/18/19 11:56 AM  Result Value Ref Range   Glucose-Capillary 152 (H) 70 - 99 mg/dL  Glucose, capillary     Status: Abnormal   Collection Time: 09/18/19  4:38 PM  Result  Value Ref Range   Glucose-Capillary 125 (H) 70 - 99 mg/dL   Recent Results (from the past 240 hour(s))  SARS Coronavirus 2 by RT PCR (hospital order, performed in [facility] hospital lab) Nasopharyngeal Nasopharyngeal Swab     Status: None   Collection Time: 09/17/19 10:35 PM   Specimen: Nasopharyngeal Swab  Result Value Ref Range Status   SARS Coronavirus 2 NEGATIVE NEGATIVE Final    Comment: (NOTE) SARS-CoV-2 target nucleic acids are NOT DETECTED.  The SARS-CoV-2 RNA is generally detectable in upper and lower respiratory specimens during the acute phase of infection. The lowest concentration of SARS-CoV-2 viral copies this assay can detect is  250 copies / mL. A negative result does not preclude SARS-CoV-2 infection and should not be used as the sole basis for treatment or other patient management decisions.  A negative result may occur with improper specimen collection / handling, submission of specimen other than nasopharyngeal swab, presence of viral mutation(s) within the areas targeted by this assay, and inadequate number of viral copies (<250 copies / mL). A negative result must be combined with clinical observations, patient history, and epidemiological information.  Fact Sheet for Patients:   StrictlyIdeas.no  Fact Sheet for Healthcare Providers: BankingDealers.co.za  This test is not yet approved or  cleared by the [place] FDA and has been authorized for detection and/or diagnosis of SARS-CoV-2 by FDA under an Emergency Use Authorization (EUA).  This EUA will remain in effect (meaning this test can be used) for the duration of the COVID-19 declaration under Section 564(b)(1) of the Act, 21 U.S.C. section 360bbb-3(b)(1), unless the authorization is terminated or revoked sooner.  Performed at [facility], [place] [address], [place], [place] [address]     Renal Function: Recent Labs    09/17/19 8315 09/18/19 0421  CREATININE 1.67* 0.69   Estimated Creatinine Clearance: 71 mL/min (by C-G formula based on SCr of 0.69 mg/dL).  Radiologic Imaging: CT ABDOMEN PELVIS W CONTRAST  Result Date: 09/17/2019 CLINICAL DATA:  Left lower quadrant pain, hematuria EXAM: CT ABDOMEN AND PELVIS WITH CONTRAST TECHNIQUE: Multidetector CT imaging of the abdomen and pelvis was performed using the standard protocol following bolus administration of intravenous contrast. CONTRAST:  72mL OMNIPAQUE IOHEXOL 300 MG/ML  SOLN COMPARISON:  None. FINDINGS: Lower chest: No acute abnormality. Hepatobiliary: No focal lesion. Calcified gallstone. Dilatation of the common bile duct measuring  up to 1.1 cm. There is tapering distally. Pancreas: Unremarkable. Spleen: Unremarkable. Adrenals/Urinary Tract: Mild diffuse thickening of the left adrenal. No hydronephrosis. 3 mm nonobstructing calculus of the lower pole of the left kidney. There is heterogeneous density within the bladder lumen. Mild wall thickening identified. Stomach/Bowel: Stomach is within normal limits. Bowel is normal in caliber. Sigmoid diverticulosis. Normal appendix. Vascular/Lymphatic: Aortic atherosclerosis with calcified and irregular noncalcified plaque. The infrarenal aorta measures up to 2.9 cm. More proximal aorta measures 1.9 cm. No enlarged lymph nodes identified. Reproductive: No adnexal mass. There is a heterogeneous exophytic mass of the uterine fundus measuring approximately 8 x 12 x 11 cm. Other: No ascites. No significant abnormality of the abdominal wall. Musculoskeletal: Degenerative changes of the included spine. No acute osseous abnormality. IMPRESSION: Heterogeneous density within the bladder lumen likely reflecting blood products. Mild wall thickening is present but no discrete mass is identified. Large exophytic mass of the uterine fundus measuring up to 12 cm. Small nonobstructing left renal calculus. Cholelithiasis. Mild dilatation of the proximal common bile duct with distal tapering. Sigmoid diverticulosis. Electronically Signed  By: Macy Mis M.D.   On: 09/17/2019 13:52    I independently reviewed the above imaging studies.  Procedure: Patient was prepped and draped in the usual sterile fashion. Existing 14Fr catheter was removed. A new 22Fr 3-way foley catheter was placed without difficulty. 10cc of sterile water was instilled in the balloon. The third port was capped. The catheter was irrigated with 200 cc of sterile water, with return of small clot flecks. Catheter was draining well.   Impression/Recommendation: HGT1 bladder cancer Gross hematuria, clot retention  - Send urine for  culture - Recommend formal bladder ultrasound to evaluate for persistent clot in the bladder.  - Tentative plan for re-resection TURBT at [facility] on Tuesday 8/10, will consider this weekend if patient has worsening gross hematuria with transfusion requirement - Would continue ASA, recommend starting therapeutic heparin if needed as this could then be held on day of procedure. Will defer continuous bladder irrigation for now as long as catheter continues to drain. Ok for nursing to irrigate catheter (through primary port) as needed. - Belladonna&opium suppository PRN for bladder spasms  Carmie Kanner 09/18/2019, 4:46 PM

## 2019-09-18 NOTE — Progress Notes (Signed)
Received page from nurse who stated irrigation was unsuccessful.  Spoke with Dr. Everlena Cooper, urology, on the phone and she stated issue was likely due to spasms and to replace foley with 16 french catheter.  Relayed this to pt's nurse.

## 2019-09-18 NOTE — Progress Notes (Signed)
Spoke with Dr. Trula Slade with vascular surgery, recommends duplex or CTA of stent. If there are any problems, he will gladly come to see the patient or review the images. Additionally, he recommends ASA 81 mg for antiplatelet therapy. Ideally, patient would be on both ASA and Plavix, but the hematuria with need for transfusion creates complications with this.

## 2019-09-18 NOTE — Progress Notes (Addendum)
ASA 81mg  ordered per vascular recommendations Patient to have carotid duplex ultrasound today  Scheduled for TEE on Monday 8/9 to evaluate in setting of new stroke.

## 2019-09-18 NOTE — Progress Notes (Signed)
OT Cancellation Note  Patient Details Name: Lisa [name] MRN: [id number] DOB: 1950/04/16   Cancelled Treatment:    Reason Eval/Treat Not Completed: Medical issues which prohibited therapy. Per RN pt receiving blood after blood loss from bladder and inserting foley due to pt being unable to void urine. Will follow up when schedule allows.  [name]/OTS [name] 09/18/2019, 10:28 AM

## 2019-09-18 NOTE — Evaluation (Addendum)
Speech Language Pathology Evaluation Patient Details Name: Lisa [name] MRN: [id number] DOB: Jul 09, 1950 Today's Date: 09/18/2019 Time: 1324-4010 SLP Time Calculation (min) (ACUTE ONLY): 14 min  Problem List:  Patient Active Problem List   Diagnosis Date Noted  . 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 Lacunar infarct, acute ([place]) 09/17/2019  . Acute blood loss anemia 09/17/2019  . Pressure injury of skin 09/07/2019  . Gross hematuria 09/01/2019  . Hematuria 08/31/2019  . 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 Stenosis of carotid artery   . Abnormality of aortic valve   . DM (diabetes mellitus), type 2, uncontrolled w/neurologic complication ([place])   . Hypercholesteremia   . CVA (cerebral vascular accident) ([place]) 08/12/2019  . Acute ischemic stroke ([place])   . Hypertension    Past Medical History:  Past Medical History:  Diagnosis Date  . Carotid arterial disease ([place]) 07/2019  . Diabetes ([place])   . HTN (hypertension)   . Hyperlipidemia   . Stroke [facility])    acute/subacute left MCA infarct 08/12/19   Past Surgical History:  Past Surgical History:  Procedure Laterality Date  . CYSTOSCOPY WITH FULGERATION N/A 09/01/2019   Procedure: CYSTOSCOPY CLOT EVACUATION WITH POSSIBLE FULGERATION;  Surgeon: Robley Fries, MD;  Location: [place];  Service: Urology;  Laterality: N/A;  . TONSILLECTOMY    . TRANSCAROTID ARTERY REVASCULARIZATION Left 08/20/2019   Procedure: LEFT TRANSCAROTID ARTERY REVASCULARIZATION;  Surgeon: Waynetta Sandy, MD;  Location: [place];  Service: Vascular;  Laterality: Left;   HPI:  Lisa [name] is a 69 y.o. female presenting with gross hematuria and symptomatic anemic in the setting of papillary carcinoma of the bladder.  PMH significant for recent ischemic CVA, recent carotid stent placement, HTN, HLD, DM2.  Pt known to this service from recent stroke with Broca's aphasia noted at  that time.  MRI 8/5: "1. Mild-to-moderate motion degradation of the axial T1 weighted postcontrast imaging. 2. 10 mm acute/early subacute infarct within the posteromedial left thalamus. 3. Subcentimeter suspected late subacute infarct within the subcortical posterior left frontal lobe. 4. A lacunar infarct within the left lentiform nucleus is new as compared to the prior MRI of 08/12/2019, but not acute. 5. Redemonstrated large chronic left MCA territory infarct with associated chronic hemosiderin deposition and/or cortical laminar necrosis. Mild cortical enhancement is also present at this site, which is not unexpected for a late subacute infarct."    Assessment / Plan / Recommendation Clinical Impression  Pt presents with primarily expressive language deficits which appear to be resolving to a conduction aphasia with some anomia noted as well.  Pt's language ability was assessed using the Western Aphasia Battery - bedside form (see below for additional information).  Pt answered yes/no questions and followed sequential commands with 100% accuracy.  Spontaneous speech was fluent with some nonspecific words in place of content words (pt said she was at hospital for "problem with the thing"), circumlocution ("it's my stomach, well it's not my stomacy, but it's in that area"), and word finding difficulties ("and that's, I can't remember what that is").  Pt exhibited significant difficulty with repetition task and was unable to repeat even short CVC words, although she was noted to say these words same spontaneously or with minimal cuing during confrontational naming task.  During confrontation naming task, pt answered with 50% accuracy independently.  She improved to 80% accuracy with cuing, and demonstrated ability to describe objects she could not name.  Pt benefited from phonemic cues, semantic cues, and sentence completion.  Pt is aware of errrors and at times seemed to grow frustrated. She says that she  knows what she wants to say but cannot say it.  Based on assessment, this appears to be frustration with word finding difficulties and there were minimal indicators of verbal apraxia (e.g." fush" for "fish") during assessment.  Pt did have some difficulty following directions for OME, but I suspect some element of nonverbal oral apraxia rather than a comprehension deficit. Pt's speech was clear with no dysarthria noted. Recommend speech therapy to address the above deficits and ongoing therapeutic assessment, with continued ST at next level of care.    SLP Assessment  SLP Recommendation/Assessment: Patient needs continued Speech Lanaguage Pathology Services SLP Visit Diagnosis: Aphasia (R47.01)    Follow Up Recommendations  Outpatient SLP;Home health SLP    Frequency and Duration min 2x/week  2 weeks      SLP Evaluation Cognition  Overall Cognitive Status: No family/caregiver present to determine baseline cognitive functioning       Comprehension  Auditory Comprehension Overall Auditory Comprehension: Appears within functional limits for tasks assessed Yes/No Questions: Within Functional Limits Commands: Within Functional Limits Conversation: Simple Reading Comprehension Reading Status: Not tested    Expression Expression Primary Mode of Expression: Verbal Verbal Expression Overall Verbal Expression: Impaired at baseline Initiation: No impairment Repetition: Impaired Level of Impairment: Word level Naming: Impairment Confrontation: Within functional limits Verbal Errors: Semantic paraphasias;Aware of errors Effective Techniques: Phonemic cues;Semantic cues;Sentence completion Written Expression Dominant Hand: Right Written Expression: Not tested   Oral / Motor  Oral Motor/Sensory Function Overall Oral Motor/Sensory Function: Mild impairment   [place], [place], [place] Office: [phone]  09/18/2019, 12:07  PM

## 2019-09-18 NOTE — Progress Notes (Signed)
    [facility] has been requested to perform a transesophageal echocardiogram on Lisa [name] for CVA and probable linear mobile echodensity on ventricular aspect of the aortic valve.  After careful review of history and examination, the risks and benefits of transesophageal echocardiogram have been explained including risks of esophageal damage, perforation (1:10,000 risk), bleeding, pharyngeal hematoma as well as other potential complications associated with conscious sedation including aspiration, arrhythmia, respiratory failure and death. Alternatives to treatment were discussed, questions were answered. Patient is willing to proceed.  Pt noted she would refuse if she had to go home the same day.   She is worried about her husband will ask social worker to see if anything can be done.   Cecilie Kicks, NP  09/18/2019 2:23 PM

## 2019-09-18 NOTE — Progress Notes (Signed)
Spoke with patient's sister in law [name] [facility] [name]) with permission from patient. Provided update on hospital course including:  Lacunar Stroke, discussed that Neurology had been consulted. Discussed plan for antiplatelet (resume aspirin) after discussion with Vascular Surgery. Planning for TEE Monday, discussed with Dr. Gardiner Rhyme and resident physicians discussed with Cardiology. Will obtain Carotid USG per discussion with Vascular Surgery.   Hematuria due to Bladder Cancer, Urology has been consulted. Discussed rotating call schedule of providers. Also discussed plan for uterine abnormality on CT.   Mental Status, sister in law [facility]) has concerns about patient's change in mood over past several weeks. She visited patient at home, where it appeared unkempt, dusty. Would prefer skilled nursing placement. We also discussed a capacity assessment, which LCSW has also requested. Will plan to reach out to Psychiatry about this assessment during her stay, likely Monday given consultation schedule.   Sister in law had several updates - Patient LEFT hand dominant (she has been informing PT that she is RHD) - When MD examined patient again this afternoon, I asked patient what her name was. Patient responded she could not tell examiner her name. I did cue the patient with wrong names first---common female names Izora Gala etc) which she reported 'were not it'. This was correct. When I cued Masci', she stated "yes that is it" but could not repeat the word 'Dotsie'.  - Leslieann Whisman has HCPOA paperwork, he is to bring to hospital.   All questions answered to best of ability. I provided Ms. Kovack Russell with my personal cell phone number should she have any needs over the weekend.  Dorris Singh, MD  Family Medicine Teaching Service

## 2019-09-18 NOTE — Progress Notes (Signed)
62 French foley inserted, output was 800 ml, still red in color the previous foley had blood clots when it was removed, administered PRN as requested by the patient and as per MD orders, will continue to monitor

## 2019-09-19 ENCOUNTER — Inpatient Hospital Stay (HOSPITAL_COMMUNITY): Payer: Medicare Other

## 2019-09-19 ENCOUNTER — Other Ambulatory Visit (HOSPITAL_COMMUNITY): Payer: Medicare Other

## 2019-09-19 DIAGNOSIS — I6389 Other cerebral infarction: Secondary | ICD-10-CM

## 2019-09-19 DIAGNOSIS — D649 Anemia, unspecified: Secondary | ICD-10-CM | POA: Diagnosis not present

## 2019-09-19 DIAGNOSIS — I6521 Occlusion and stenosis of right carotid artery: Secondary | ICD-10-CM

## 2019-09-19 DIAGNOSIS — I639 Cerebral infarction, unspecified: Secondary | ICD-10-CM | POA: Diagnosis not present

## 2019-09-19 DIAGNOSIS — Z8673 Personal history of transient ischemic attack (TIA), and cerebral infarction without residual deficits: Secondary | ICD-10-CM

## 2019-09-19 DIAGNOSIS — I63512 Cerebral infarction due to unspecified occlusion or stenosis of left middle cerebral artery: Secondary | ICD-10-CM

## 2019-09-19 DIAGNOSIS — I6381 Other cerebral infarction due to occlusion or stenosis of small artery: Secondary | ICD-10-CM | POA: Diagnosis not present

## 2019-09-19 DIAGNOSIS — R310 Gross hematuria: Secondary | ICD-10-CM | POA: Diagnosis not present

## 2019-09-19 DIAGNOSIS — D62 Acute posthemorrhagic anemia: Secondary | ICD-10-CM | POA: Diagnosis not present

## 2019-09-19 DIAGNOSIS — E1149 Type 2 diabetes mellitus with other diabetic neurological complication: Secondary | ICD-10-CM | POA: Diagnosis not present

## 2019-09-19 LAB — HEMOGLOBIN AND HEMATOCRIT, BLOOD
HCT: 24.5 % — ABNORMAL LOW (ref 36.0–46.0)
Hemoglobin: 8.2 g/dL — ABNORMAL LOW (ref 12.0–15.0)

## 2019-09-19 LAB — CBC
HCT: 22.7 % — ABNORMAL LOW (ref 36.0–46.0)
Hemoglobin: 7.5 g/dL — ABNORMAL LOW (ref 12.0–15.0)
MCH: 29.2 pg (ref 26.0–34.0)
MCHC: 33 g/dL (ref 30.0–36.0)
MCV: 88.3 fL (ref 80.0–100.0)
Platelets: 348 10*3/uL (ref 150–400)
RBC: 2.57 MIL/uL — ABNORMAL LOW (ref 3.87–5.11)
RDW: 14.7 % (ref 11.5–15.5)
WBC: 13.2 10*3/uL — ABNORMAL HIGH (ref 4.0–10.5)
nRBC: 0.2 % (ref 0.0–0.2)

## 2019-09-19 LAB — COMPREHENSIVE METABOLIC PANEL
ALT: 16 U/L (ref 0–44)
AST: 26 U/L (ref 15–41)
Albumin: 2.3 g/dL — ABNORMAL LOW (ref 3.5–5.0)
Alkaline Phosphatase: 94 U/L (ref 38–126)
Anion gap: 12 (ref 5–15)
BUN: 13 mg/dL (ref 8–23)
CO2: 23 mmol/L (ref 22–32)
Calcium: 8.6 mg/dL — ABNORMAL LOW (ref 8.9–10.3)
Chloride: 104 mmol/L (ref 98–111)
Creatinine, Ser: 0.85 mg/dL (ref 0.44–1.00)
GFR calc Af Amer: >60 mL/min (ref >60)
GFR calc non Af Amer: >60 mL/min (ref >60)
Glucose, Bld: 158 mg/dL — ABNORMAL HIGH (ref 70–99)
Potassium: 4.8 mmol/L (ref 3.5–5.1)
Sodium: 139 mmol/L (ref 135–145)
Total Bilirubin: 1.2 mg/dL (ref 0.3–1.2)
Total Protein: 5.2 g/dL — ABNORMAL LOW (ref 6.5–8.1)

## 2019-09-19 LAB — GLUCOSE, CAPILLARY
Glucose-Capillary: 188 mg/dL — ABNORMAL HIGH (ref 70–99)
Glucose-Capillary: 161 mg/dL — ABNORMAL HIGH (ref 70–99)
Glucose-Capillary: 115 mg/dL — ABNORMAL HIGH (ref 70–99)
Glucose-Capillary: 131 mg/dL — ABNORMAL HIGH (ref 70–99)

## 2019-09-19 LAB — ECHOCARDIOGRAM COMPLETE
S' Lateral: 2.6 cm
Weight: 2839.52 oz

## 2019-09-19 LAB — PREPARE RBC (CROSSMATCH)

## 2019-09-19 MED ORDER — SODIUM CHLORIDE 0.9% IV SOLUTION: Freq: Once | INTRAVENOUS | Status: AC

## 2019-09-19 MED ORDER — ASPIRIN EC 81 MG PO TBEC
81.0000 mg | DELAYED_RELEASE_TABLET | Freq: Every day | ORAL | Status: DC
  Administered 2019-09-19: 81 mg via ORAL
  Filled 2019-09-19: qty 1

## 2019-09-19 NOTE — Progress Notes (Signed)
  Echocardiogram 2D Echocardiogram has been performed.  Lisa [name] 09/19/2019, 9:32 AM

## 2019-09-19 NOTE — Progress Notes (Signed)
Interim progress note:  Went to check in on patient for the night.  RN taking care of patient outside of room, has no concerns at this time.  Patient peacefully sleeping, breathing comfortably.  Vitals stable.  Foley bag with approximately 200 cc of bloody urine.  Will continue to monitor with established plan of care as is.   Patriciaann Clan, DO

## 2019-09-19 NOTE — Progress Notes (Addendum)
In to see patient to check in. Heart rate improved. Denies chest pain, dyspnea.   Vitals:   09/19/19 1200 09/19/19 1430  BP: (!) 148/105 (!) 164/81  Pulse: (!) 108 (!) 105  Resp: (!) 22 19  Temp: 98.7 F (37.1 C) 98.6 F (37 C)  SpO2: 100%   On room air.   Cardiac: Tachycardic rate and rhythm. Normal S1/S2. No murmurs, rubs, or gallops appreciated. Lungs: Clear bilaterally to ascultation.  Abdomen: Normoactive bowel sounds. Minimal suprapubic tenderness.   Discussed CT scan with patient. She declines at this time. Discussed importance of CT scan. Will readdress later today again. Patient reports she does not 'care if my lungs have clot'. Discussed risks, which she repeated to some degree. Will reassess later today to see if amenable--patient most amenable to procedures in AM.  If becomes hemodynamically unstable or heart rate worsens (it has actually improved) will start heparin drip. Will wait to do so now given ongoing bleeding from bladder.  Lisa Singh, MD  Family Medicine Teaching Service   Addendum: Spoke with patient again at 1500. She again declined CT scan, discussed risks. She voiced understanding. Called family Rod Holler) to update. Will continue to try to encourage patient to undergo CT scan. If tachycardia worsens, or she becomes hypoxic, will start heparin gtt. Discussed with with Dr. Erlinda Hong. Per Urology note, also okay with heparin gtt if needed.

## 2019-09-19 NOTE — Progress Notes (Addendum)
Family Medicine Teaching Service Daily Progress Note Intern Pager: [phone]  Patient name: Lisa [name] Medical record number: [id number] Date of birth: 1950-04-02 Age: 69 y.o. Gender: female  Primary Care Provider: Patient, No Pcp Per Consultants: Neurology, urology, cardiology Code Status: Full Code  Pt Overview and Major Events to Date:  09/17/2019 - admitted, received 1u PRBC 09/18/2019 - TEE, transfused 2u PRBC 09/19/2019 - transfused 1u PRBC 09/21/2019 - scheduled for TEE 09/22/2019 - scheduled for TransUrethral Resection of Bladder Tumor (TURBT)  Assessment and Plan: Lisa [name] is a 69 y.o female who presented with gross hematuria and blood loss anemia in setting of papillary carcinoma of the bladder.  PMH significant for previous ischemic CVA with residual neurological defects, carotid stent placement due to carotid atherosclerosis, HTN, HLD, and type 2 diabetes.  Acute blood loss anemia secondary to hematuria  Papillary carcinoma of the bladder: Hemoglobin on admission (8/5) 6.9>1u PRBC > 7.0>7.1 (8/6)>2u PRBC > 7.5 (8/7), additional 1u PRBC ordered, with plan to repeat H&H s/p transfusion.  Patient's transfusion threshold is 8.  Given active bleed and recent stroke antiplatelet therapy will be a careful balance.  -Neurology consulted, appreciate input-planning for TURBT 8/10, location is not yet finalized ([place] [facility] [place] [facility]) -Foley placed to decompress bladder however irrigation was not great and urology team changed 16 Fr Foley to larger (20 fr) catheter.  -Uterine fundal mass appreciated on imaging, suspect uterine fibroid, however this could also be potential source of bleeding.  Unfortunately, she declined transvaginal ultrasound. Transabdominal ultrasound to evaluate uterus, bladder ordered. -Continue to monitor daily CBCs -PT/OT consulted -patient unable to participate 8/6 due to fatigue -ASA 81 mg daily to prevent stent thrombosis, hold Plavix due to acute  hematuria; patient unfortunately declined SCDs -Prescribed opium-belladonna suppository for bladder spasms -Oxycodone 5 mg every 4 hours as needed for moderate/severe pain -Tylenol 650 mg p.o. or rectally every 6 hours as needed for mild pain, fevers -Can bladder scan as needed for concerns for urinary retention  Lacunar infarction, acute: As appreciated on head imaging on admission.  No neurological deficits appreciated. 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  8/7 TEE did not comment on echodensity (family medicine team reach out to cardiology to comment on hypodensity), cardiology stated they could appreciate what appeared to be "calcification on the aortic valve", however recommended TEE for official work-up. The patient was also noted to be tachycardic this morning up to 120, currently heart rate 105 bpm. -Cardiology consulted, TEE Monday 8/9 -Ultrasound carotid duplex ordered to evaluate patient's carotid artery stent -Due to tachycardia would like to rule out pulmonary embolism with CTPA, however patient has been declining the scan. Can try later today 8/7  AKI, resolved: Cr  1.67 on admission, today Cr 0.85 (8/7). -Continue to monitor BMP  Hypertension: Most recent blood pressure 137/82, prior to that was 164/81 (8/7).  Patient on losartan 25 mg, metoprolol 25, and amlodipine 10 mg at home.  Permissive hypertension up to 814 systolic. -Continue to monitor blood pressure -Treat as needed if systolics greater than 481  Type 2 diabetes: HbA1c 6.5%, patient takes Metformin 500 mg daily at home.  CBGs overnight 135-188.  Has required 4 units aspart over past 24 hours. -Holding home metformin -Daily CBGs -Sensitive Sliding Scale insulin  Carotid atherosclerosis  hyperlipidemia: Patient s/p left TCAR (08/20/2019) for symptomatic left carotid stenosis.  Patient was started on aspirin and Plavix. -Continue atorvastatin 40 mg daily -Per vascular can continue aspirin, hold Plavix.  FEN/GI: No fluids, carb modified diet; bisacodyl suppository and MiraLAX as needed PPx: Aspirin 81 mg, encouraging SCDs  Disposition: Remains in progressive care; pending PT eval (PTA has been unable to see patient due to patient condition, fatigue)  Subjective:  Patient seen resting in bed, reports that she wants to keep resting this morning. Denies having any questions or concerns for the at this morning.  Objective: Temp:  [98 F (36.7 C)-98.6 F (37 C)] 98 F (36.7 C) (08/07 0545) Pulse Rate:  [80-123] 108 (08/07 0745) Resp:  [13-24] 24 (08/07 0745) BP: (143-163)/(60-98) 143/90 (08/07 0745) SpO2:  [98 %-100 %] 98 % (08/07 0545)  Physical Exam: General: Pallor appreciated, tired this morning Cardiovascular: Regular rate, tachycardia up to 110 bpm, grade 2 systolic murmur appreciated, clear S1 Respiratory: CTA bilaterally, no rales appreciated Abdomen: Tenderness to palpation of suprapubic region, otherwise no masses and normal bowel sounds appreciated Extremities: Pallor  Laboratory: Recent Labs  Lab 09/17/19 0907 09/17/19 2214 09/18/19 0421 09/18/19 1917 09/19/19 0305   WBC 13.5*  --  10.0  --  13.2*  HGB 6.9*   < > 7.1* 8.1* 7.5*  HCT 22.2*   < > 22.6* 25.0* 22.7*  PLT 488*  --  351  --  348   < > = values in this interval not displayed.   Recent Labs  Lab 09/17/19 0907 09/18/19 0421 09/19/19 0305  NA 137 137 139  K 3.4* 3.1* 4.8  CL 97* 101 104  CO2 25 22 23   BUN 16 13 13   CREATININE 1.67* 0.69 0.85  CALCIUM 8.8* 8.5* 8.6*  PROT  --  5.1* 5.2*  BILITOT  --  1.1 1.2  ALKPHOS  --  67 94  ALT  --  11 16  AST  --  16 26  GLUCOSE 224* 124* 158*    Posttransfusion H&H: Follow-up  Imaging/Diagnostic Tests: CT Head: A lacunar infarct w/in left lentiform nucleus is new as compared to the MRI of 08/12/2019, and may be acute or subacute. Redemonstrated large, now late subacute to chronic, left MCA vascular territory infarct. Uniform cortical hyperdensity throughout the infarction territory likely reflects cortical laminar necrosis. Stable background mild generalized parenchymal atrophy and chronic small vessel ischemic disease.  MR ANGIO HEAD:  MRI BRAIN 1. Mild-to-moderate motion degradation of the axial T1 weighted postcontrast imaging.  2. 10 mm acute/early subacute infarct within the posteromedial left thalamus.  3. Subcentimeter suspected late subacute infarct within the subcortical posterior left frontal lobe.  4. A lacunar infarct within the left lentiform nucleus is new as compared to the prior MRI of 08/12/2019, but not acute.  5. Redemonstrated large chronic left MCA territory infarct with associated chronic hemosiderin deposition and/or cortical laminar necrosis. Mild cortical enhancement is also present at this site, which is not unexpected for a late subacute infarct.  MRA head:  1. No intracranial  large vessel occlusion.  2. Intracranial atherosclerotic disease with multifocal stenoses, most notably as follows.  3. Mild/moderate stenosis at the origin of the M1 left middle cerebral artery.  4. Sites of moderate stenosis within the  proximal superior and inferior division left M2 MCA branches.  5. High-grade focal stenosis within a mid M2 inferior division left MCA branch vessel.  6. Severe stenosis within a proximal to mid superior division right M2 MCA branch vessel.  7. Moderate to moderately severe stenosis within the right PCA at the P2/P3 junction.  8. Sites of up to moderate stenosis within the P3 and more distal left PCA.  CT ABDOMEN PELVIS: Heterogeneous density within the bladder lumen likely reflecting blood products. Mild wall thickening is present but no discrete mass is identified. Large exophytic mass of the uterine fundus measuring up to 12 cm. Small nonobstructing left renal calculus. Cholelithiasis. Mild dilatation of the proximal common bile duct with distal tapering. Sigmoid diverticulosis.    Daisy Floro, DO 09/19/2019, 7:57 AM PGY-3, [place] Intern pager: [phone], text pages welcome

## 2019-09-19 NOTE — Progress Notes (Signed)
Patient refusing SCD's. Pt educated.

## 2019-09-19 NOTE — Progress Notes (Addendum)
Called Rod Holler (patient's sister in law) per patient's AM request.  Per Rod Holler, patient's husband has been using alcohol again, which has been causing stress to patient and family Rod Holler and [name] are sisters). Updated Rod Holler regarding patient, discussed plan for CT PE given new tachycardia. We also discussed need for transfusion. We also reviewed her admission imaging, events leading to foley catheter placement, and uterine findings on CT.  - Will continue to discuss TVUS with patient. She declined again this AM. - Discussed aspirin therapy. Per discussion with Urology and Vascular surgery notes, will continue at this time.  - Will discuss timing of procedure with Urology  (would prefer to keep at [facility] for procedure). Dr. Ouida Sills discussed with Urology.  - Family would like patient to place in assisted living or skilled facility.I discussed this with Elesa Hacker is uncertain if patient has capacity to make decisions. Rubie has had notable decline in function over past months due to medical conditions.   All questions answered. Will update with results of CTPE as they return. This was ordered at 1001 AM.   Also called and provide similar [facility] to [facility]. All questions answered.  Will call Rod Holler with results of CT PE as family event this PM.   Dorris Singh, MD  [facility] Medicine Teaching Service

## 2019-09-19 NOTE — Progress Notes (Signed)
Pts HR elevated over 120's, paged Dr Jeannine Kitten who came to see the patient, no orders at the moment. Phil CN, aware of the changes in mews, Will continue to monitor

## 2019-09-19 NOTE — Progress Notes (Signed)
Urology Progress Note  Physician requesting consult: Dorris Singh, MD  Reason for consult: Gross hematuria  Subjective: Patient had significant bladder spasms around 22Fr catheter last night and poor drainage. Catheter was removed and a 16Fr foley was placed instead. This drained well, with dark red urine. Bladder scan this morning was 80cc. Very difficult to irrigate given small lumen and bladder spasms. Elected to [employer] to 20Fr catheter. Able to irrigate small clot fragments, urine relatively draining well. More consistent with clot breakdown products than active bleeding.  Hb 7.5 this am, receiving transfusion.   Physical Exam:  Vital signs in last 24 hours: Temp:  [98 F (36.7 C)-98.6 F (37 C)] 98.3 F (36.8 C) (08/07 1000) Pulse Rate:  [80-128] 115 (08/07 1000) Resp:  [13-24] 16 (08/07 1000) BP: (132-163)/(71-98) 155/93 (08/07 1000) SpO2:  [98 %-100 %] 100 % (08/07 1000) Constitutional:  Alert and oriented, No acute distress Cardiovascular: Regular rate and rhythm, No JVD Respiratory: Normal respiratory effort GI: Abdomen is soft, nontender, nondistended, no abdominal masses GU: 20 Fr Foley catheter in place draining merlot colored urine   Laboratory Data:  Recent Labs    09/17/19 0907 09/17/19 2214 09/18/19 0421 09/18/19 1917 09/19/19 0305  WBC 13.5*  --  10.0  --  13.2*  HGB 6.9* 7.0* 7.1* 8.1* 7.5*  HCT 22.2* 22.2* 22.6* 25.0* 22.7*  PLT 488*  --  351  --  348    Recent Labs    09/17/19 0907 09/18/19 0421 09/19/19 0305  NA 137 137 139  K 3.4* 3.1* 4.8  CL 97* 101 104  GLUCOSE 224* 124* 158*  BUN 16 13 13   CALCIUM 8.8* 8.5* 8.6*  CREATININE 1.67* 0.69 0.85     Renal Function: Recent Labs    09/17/19 0907 09/18/19 0421 09/19/19 0305  CREATININE 1.67* 0.69 0.85   Estimated Creatinine Clearance: 66.8 mL/min (by C-G formula based on SCr of 0.85 mg/dL).   Procedure: Patient was prepped and draped in the usual sterile fashion. Existing 16Fr  catheter was removed. A new 20Fr 2-way council tipped foley catheter was placed without difficulty. 10cc of sterile water was instilled in the balloon. The catheter was irrigated with 200 cc of sterile water, with return of small clot flecks. Catheter was draining well.   Impression/Recommendation: HGT1 bladder cancer Gross hematuria, clot retention  - Catheter upsized slightly today to allow for better drainage. Anticipate that she will likely continue to intermittently leak around the catheter given sensitive bladder and spasms. As long as bladder scans remain low, no indication to exchange catheter. Would recommend forward flush of ~50cc sterile water/saline if catheter draining poorly.  - Recommend repeat attempt at formal bladder ultrasound to evaluate for size of any persistent clot in the bladder.  - Patient is currently scheduled for re-resection TURBT at [facility] on Tuesday 8/10 at 1:30. Would recommend that this procedure be done at [facility] to provide patient with highest opportunity for success, where this procedure is performed regularly. Will work towards trying to arrange necessary transport  - Agree with continuing ASA, transfusion PRN - Belladonna&opium suppository PRN for bladder spasms  Carmie Kanner 09/19/2019, 11:33 AM

## 2019-09-19 NOTE — Consult Note (Signed)
Vascular and Vein Specialist of [facility]  Patient name: Lisa [name] MRN: [id number] DOB: 1950-03-10 Sex: female   REQUESTING PROVIDER:   Family [facility]icein   REASON FOR CONSULT:    Carotid disease  HISTORY OF PRESENT ILLNESS:   Lisa [name] is a 69 y.o. female, who is status post left TCAR on 08/20/2019 for symptomatic left carotid stenosis by Dr. Donzetta Matters.  Prior to her procedure she was diagnosed with a left MCA embolic stroke.  She has a new MRI that shows new subacute infarcts.  She was noted to have a right facial droop and mild expressive aphasia these appear to be residual deficits from her prior infarct she presented to the emergency department on 09/17/2019 via EMS for hematuria.  Her hemoglobin was 6.9.  She was recently admitted for hematuria requiring transfusion.  Cystoscopy revealed high-grade papillary carcinoma of the bladder.  The patient is medically managed for diabetes hypertension.  She is on a statin for hypercholesterolemia.  PAST MEDICAL HISTORY    Past Medical History:  Diagnosis Date  . Carotid arterial disease ([place]) 07/2019  . Diabetes ([place])   . HTN (hypertension)   . Hyperlipidemia   . Stroke [facility])    acute/subacute left MCA infarct 08/12/19     FAMILY HISTORY   Family History  Problem Relation Age of Onset  . Hypertension Mother   . Lung cancer Father     SOCIAL HISTORY:   Social History   Socioeconomic History  . Marital status: Married    Spouse name: Not on file  . Number of children: Not on file  . Years of education: Not on file  . Highest education level: Not on file  Occupational History  . Not on file  Tobacco Use  . Smoking status: Former Smoker    Packs/day: 0.50    Types: Cigarettes    Quit date: 07/28/2019    Years since quitting: 0.1  . Smokeless tobacco: Never Used  Vaping Use  . Vaping Use: Never used  Substance and Sexual Activity  . Alcohol use: Not Currently  . Drug use:  Never  . Sexual activity: Yes  Other Topics Concern  . Not on file  Social History Narrative  . Not on file   Social Determinants of Health   Financial Resource Strain:   . Difficulty of Paying Living Expenses:   Food Insecurity:   . Worried About Charity fundraiser in the Last Year:   . Arboriculturist in the Last Year:   Transportation Needs:   . Film/video editor (Medical):   Marland Kitchen Lack of Transportation (Non-Medical):   Physical Activity:   . Days of Exercise per Week:   . Minutes of Exercise per Session:   Stress:   . Feeling of Stress :   Social Connections:   . Frequency of Communication with Friends and Family:   . Frequency of Social Gatherings with Friends and Family:   . Attends Religious Services:   . Active Member of Clubs or Organizations:   . Attends Archivist Meetings:   Marland Kitchen Marital Status:   Intimate Partner Violence:   . Fear of Current or Ex-Partner:   . Emotionally Abused:   Marland Kitchen Physically Abused:   . Sexually Abused:     ALLERGIES:    No Known Allergies  CURRENT MEDICATIONS:    Current Facility-Administered Medications  Medication Dose Route Frequency Provider Last Rate Last Admin  .  stroke: mapping our early  stages of recovery book   Does not apply Once Daisy Floro, DO      . acetaminophen (TYLENOL) tablet 650 mg  650 mg Oral Q6H PRN Milus Banister C, DO   650 mg at 09/18/19 1819   Or  . acetaminophen (TYLENOL) suppository 650 mg  650 mg Rectal Q6H PRN Milus Banister C, DO      . atorvastatin (LIPITOR) tablet 40 mg  40 mg Oral Daily Martyn Malay, MD   40 mg at 09/19/19 0240  . bisacodyl (DULCOLAX) suppository 10 mg  10 mg Rectal Daily PRN Daisy Floro, DO      . Chlorhexidine Gluconate Cloth 2 % PADS 6 each  6 each Topical Daily Martyn Malay, MD   6 each at 09/19/19 0910  . insulin aspart (novoLOG) injection 0-9 Units  0-9 Units Subcutaneous TID WC Milus Banister C, DO   2 Units at 09/19/19 [id number]  .  opium-belladonna (B&O) suppository 16.2-60mg   1 suppository Rectal Q8H PRN Delight Hoh, MD   1 suppository at 09/18/19 2307  . oxyCODONE (Oxy IR/ROXICODONE) immediate release tablet 5 mg  5 mg Oral Q4H PRN Simmons-Robinson, Makiera, MD   5 mg at 09/18/19 2250  . polyethylene glycol (MIRALAX / GLYCOLAX) packet 17 g  17 g Oral Daily PRN Milus Banister C, DO        REVIEW OF SYSTEMS:   [X]  denotes positive finding, [ ]  denotes negative finding Cardiac  Comments:  Chest pain or chest pressure:    Shortness of breath upon exertion:    Short of breath when lying flat:    Irregular heart rhythm:        Vascular    Pain in calf, thigh, or hip brought on by ambulation:    Pain in feet at night that wakes you up from your sleep:     Blood clot in your veins:    Leg swelling:         Pulmonary    Oxygen at home:    Productive cough:     Wheezing:         Neurologic    Sudden weakness in arms or legs:     Sudden numbness in arms or legs:     Sudden onset of difficulty speaking or slurred speech:    Temporary loss of vision in one eye:     Problems with dizziness:         Gastrointestinal    Blood in stool:      Vomited blood:         Genitourinary    Burning when urinating:     Blood in urine:        Psychiatric    Major depression:         Hematologic    Bleeding problems:    Problems with blood clotting too easily:        Skin    Rashes or ulcers:        Constitutional    Fever or chills:     PHYSICAL EXAM:   Vitals:   09/19/19 0500 09/19/19 0545 09/19/19 0745 09/19/19 0919  BP:  (!) 163/95 (!) 143/90 132/82  Pulse:  (!) 123 (!) 108 (!) 128  Resp: (!) 21  (!) 24   Temp:  98 F (36.7 C) 98 F (36.7 C) 98.1 F (36.7 C)  TempSrc:  Oral Oral Oral  SpO2:  98% 100% 100%  Weight:  Patient refusing to be examined because she wants to get some rest  STUDIES:   I have reviewed the following studies  MRA head: 1. No intracranial large vessel  occlusion. 2. Intracranial atherosclerotic disease with multifocal stenoses, most notably as follows. 3. Mild/moderate stenosis at the origin of the M1 left middle cerebral artery. 4. Sites of moderate stenosis within the proximal superior and inferior division left M2 MCA branches. 5. High-grade focal stenosis within a mid M2 inferior division left MCA branch vessel. 6. Severe stenosis within a proximal to mid superior division right M2 MCA branch vessel. 7. Moderate to moderately severe stenosis within the right PCA at the P2/P3 junction. 8. Sites of up to moderate stenosis within the P3 and more distal left PCA.  MRI brain: 1. Mild-to-moderate motion degradation of the axial T1 weighted postcontrast imaging. 2. 10 mm acute/early subacute infarct within the posteromedial left thalamus. 3. Subcentimeter suspected late subacute infarct within the subcortical posterior left frontal lobe. 4. A lacunar infarct within the left lentiform nucleus is new as compared to the prior MRI of 08/12/2019, but not acute. 5. Redemonstrated large chronic left MCA territory infarct with associated chronic hemosiderin deposition and/or cortical laminar necrosis. Mild cortical enhancement is also present at this site, which is not unexpected for a late subacute infarct. ASSESSMENT and PLAN   History of left sided TCAR for symptomatic stenosis approximately 1 month ago, now admitted with symptomatic anemia from her bladder cancer.  She is now 1 month out from her TCAR, and so she can stop her Plavix.  I would continue her aspirin to minimize the risk of stent thrombosis.  She needs to have her stent evaluated to make sure it remains patent.  A carotid duplex is pending.   Leia Alf, MD, FACS Vascular and Vein Specialists of [facility] [phone] Pager [phone]

## 2019-09-19 NOTE — Progress Notes (Signed)
PT Cancellation Note  Patient Details Name: Lisa [name] MRN: [id number] DOB: [birth year]   Cancelled Treatment:    Reason Eval/Treat Not Completed: Discussed pt case with RN and MD who ask that PT hold until tomorrow. Will continue to follow and initiate PT eval when able.    Thelma Comp 09/19/2019, 3:02 PM   Rolinda Roan, PT, DPT Acute Rehabilitation Services Pager: [phone] Office: [phone]

## 2019-09-19 NOTE — Progress Notes (Addendum)
STROKE TEAM PROGRESS NOTE   INTERVAL HISTORY Dr. Owens Shark is at bedside. Pt lying in bed, receiving PRBC transfusion, very sleepy and drowsy, although arousable but expressed verbally that did not want to be disturbed at this time.   Discussed with Dr. Owens Shark. Pt has continue hematuria due to bladder cancer, although better than yesterday per urology. Continue to have anemia, getting PRBC. From neuro standpoint, TEE can be postponed given pt current condition but per Dr. Owens Shark that family want TEE to be done on Monday. Urology OK to continue ASA at this time.   Vitals:   09/18/19 2351 09/19/19 0445 09/19/19 0459 09/19/19 0500  BP: (!) 148/80 (!) 146/98    Pulse: 100 (!) 120    Resp: 20  13 (!) 21  Temp: 98.1 F (36.7 C) 98.2 F (36.8 C)    TempSrc: Oral Oral    SpO2: 98% 100%    Weight:       CBC:  Recent Labs  Lab 09/18/19 0421 09/18/19 0421 09/18/19 1917 09/19/19 0305  WBC 10.0  --   --  13.2*  HGB 7.1*   < > 8.1* 7.5*  HCT 22.6*   < > 25.0* 22.7*  MCV 86.6  --   --  88.3  PLT 351  --   --  348   < > = values in this interval not displayed.   Basic Metabolic Panel:  Recent Labs  Lab 09/18/19 0421 09/19/19 0305  NA 137 139  K 3.1* 4.8  CL 101 104  CO2 22 23  GLUCOSE 124* 158*  BUN 13 13  CREATININE 0.69 0.85  CALCIUM 8.5* 8.6*   Lipid Panel:  Recent Labs  Lab 09/18/19 0421  CHOL 94  TRIG 82  HDL 32*  CHOLHDL 2.9  VLDL 16  LDLCALC 46   HgbA1c:  Recent Labs  Lab 09/18/19 0421  HGBA1C 6.5*   Urine Drug Screen: No results for input(s): LABOPIA, COCAINSCRNUR, LABBENZ, AMPHETMU, THCU, LABBARB in the last 168 hours.  Alcohol Level No results for input(s): ETH in the last 168 hours.  IMAGING past 24 hours No results found.   Echocardiogram 09/19/19 IMPRESSIONS  1. Left ventricular ejection fraction, by estimation, is 60 to 65%. The  left ventricle has normal function. The left ventricle has no regional  wall motion abnormalities. Left ventricular  diastolic parameters are  consistent with Grade I diastolic  dysfunction (impaired relaxation).  2. Right ventricular systolic function is moderately reduced. The right  ventricular size is normal. There is normal pulmonary artery systolic  pressure.  3. The mitral valve is normal in structure. No evidence of mitral valve  regurgitation.  4. The aortic valve is normal in structure. Aortic valve regurgitation is  not visualized. No aortic stenosis is present.   TEE - pending   PHYSICAL EXAM  Temp:  [98 F (36.7 C)-99.4 F (37.4 C)] 98.6 F (37 C) (08/07 1934) Pulse Rate:  [89-128] 89 (08/07 1934) Resp:  [13-24] 20 (08/07 1934) BP: (132-164)/(76-105) 141/76 (08/07 1934) SpO2:  [98 %-100 %] 98 % (08/07 1934)  General - Well nourished, well developed, sleepy drowsy but arousable.  Ophthalmologic - fundi not visualized due to noncooperation.  Cardiovascular - Regular rhythm and rate.  Neuro - very sleepy drowsy although arousable, verbally expressed desire not to be disturbed at this time.  Speech is mostly fluent but paraphasic errors.  No dysarthria. Extraocular movements full range without nystagmus. Not cooperative on visual field testing.  Mild right  lower facial weakness.  Tongue midline.  Motor system exam shows symmetric upper and lower extremity strength without drift. Sensation, coordination and gait not tested.   ASSESSMENT/PLAN Ms. NEGIN HEGG is a 69 y.o. female with history of prior stroke in June 2021, intracranial and extracranial atherosclerotic disease with 80 to 99% left ICA stenosis on dual antiplatelet therapy with aspirin and Plavix, diabetes, hypertension, hyperlipidemia, and known bladder cancer presenting with weakness and gross hematuria resulting in symptomatic anemia.   Stroke: L thalamic infarct in setting of intracranial stenosis and symptomatic anemia due to hematuria  CT head L lentiform nucleus lacunar infarct, new from MRI 08/12/2019. Recent  L MCA territory infarct w/ cortical laminar necrosis. Small vessel disease. Atrophy.   MRI  Acute/subacute posteromedial L thalamic infarct. Late subacute subcentimeter subcortical posterior L frontal lobe infarct. L lentiform nucleus lacune new since 08/12/19. chronic large L MCA territory infarct w/ mild cortical enhancement  MRA  No LVO. Multifocal stenoses:  L M1 mild / moderate, proximal superior and inferior division L M2 branches moderate, mid L M2 inferior division high-grade focal, proximal to mid superior division R M2 severe, R PCA at P2/P3 jxn moderate to moderate severe, L P3 and distal LPCA moderate    Carotid US - pending  2D Echo - 09/19/19 - EF 60 - 65%. No cardiac source of emboli identified.   TEE - pending  LDL 158  HgbA1c 8.2  VTE prophylaxis - SCDs   aspirin 81 mg daily and clopidogrel 75 mg daily prior to admission, now on ASA 81.   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   OK with ASA per urology  Sever anemia   Hgb - 6.9->7.1->8.1->7.5  Receiving PRBC  On ASA   CBC monitoring  Hx of recent stroke  08/2019 left MCA infarct, CT left MCA infarct, CTA head and neck left ICA bulb near occlusion, left M2 occlusion and M1 stenosis. B/l P2/P3 stenosis. MRI left MCA subacute infarct. CUS left ICA 80-99% stenosis. 2D echo concerning for AV mobile density, EF 60-65%. LDL 158 and A1C 8.2. UDS + THC. Put on DAPT and statin. VVS recommend TCAR as outpt and plan for TEE as outpt  Carotid Stenosis, Left  S/p left TCAR w/ stent 7/8 Donzetta Matters). d/c on aspirin, plavix and statin.   CUS pending  Multifocal intracranial stenosis  CTA head slow flow L M2 and distal L MCA vessels w/ superior and inferior division proximal L M2  branch occlusions. L M1 moderate stenosis. R M2 proximal to superior division moderate to severe focal stenosis. R A2 distal moderate focal stenosis. R P2/P3 high-grade focal stenosis. L P2/P3 moderate severe focal stenosis.  MRA  No LVO. Multifocal stenoses:  L M1 mild / moderate, proximal superior and inferior division L M2 branches moderate, mid L M2 inferior division high-grade focal, proximal to mid superior division R M2 severe, R PCA at P2/P3 jxn moderate to moderate severe, L P3 and distal LPCA moderate    On DAPT PTA  Current thalamic infarct likely due to PCA stenosis in the setting of symptomatic anemia  Now on ASA 81 only given hematuria and anemia  Avoid low BP  AV mobile density  ?? limban-Sacks endocarditis given malignancy??  2D Echo 08/2019 linear mobile density 0.6cm on aortic valve. EF 60-65%   TEE recommended  Last admission, pt wanted to do as outpt. Not yet done  Repeat 2D this time showed AV is normal in structure  TEE pending on Monday - likely will not change management  ? PE  Likely hypercoagulable state given malignancy  Pt declined CTA chest at this time  OK from heparin IV if needed from neuro standpoint given small infarct this time  Hypertension  Stable . Permissive hypertension (OK if < 220/120)  . SBP goal 130-150 . Avoid low BP  Hyperlipidemia  Home meds:  lipitor 80 mg daily  Dose decreased to 40 mg daily on admission  LDL 46, goal < 70  Continue statin at discharge  Diabetes type II, controlled  HgbA1c 6.5, goal < 7.0  SSI  CBGs  Tobacco abuse  Current smoker  Smoking cessation counseling will be provided  Other Stroke Risk Factors  Advanced age  Hx ETOH use, advised to drink no more than 1 drink(s) a day  Hx Substance abuse - THC  Obesity, Body mass index is 30.46 kg/m., recommend weight loss, diet and exercise as appropriate   Other Active Problems  lethargic  Hospital day # 2  Patient continues to  have severe anemia needing blood transfusion, gross hematuria, recurrent strokes due to intracranial stenosis, concerning for PE, and I discussed with Dr. Owens Shark. I spent  35 minutes in total face-to-face time with the patient, more than 50% of which was spent in counseling and coordination of care, reviewing test results, images and medication, and discussing the diagnosis, treatment plan and potential prognosis. This patient's care requiresreview of multiple databases, neurological assessment, discussion with family, other specialists and medical decision making of high complexity.  Rosalin Hawking, MD PhD Stroke Neurology 09/20/2019 4:39 AM   To contact Stroke Continuity provider, please refer to [email or web address] After hours, contact General Neurology

## 2019-09-19 NOTE — Progress Notes (Signed)
OT Cancellation Note  Patient Details Name: Lisa [name] MRN: [id number] DOB: 08/25/1950   Cancelled Treatment:    Reason Eval/Treat Not Completed: Patient at procedure or test/ unavailable.  RN starting blood products.  Will check back later.  Nilsa Nutting., OTR/L Acute Rehabilitation Services Pager [phone] Office [phone]   Lucille Passy M 09/19/2019, 9:39 AM

## 2019-09-19 NOTE — Progress Notes (Signed)
FPTS Interim Progress Note  S: Went back to reassess patient after her issues with her catheter last night.  Also had received page from nurse regarding patient having elevated heart rate.  Patient was sleeping when I went in her room, easily awoken.  She states that she feels much better currently.  Still has some mild abdominal pain but much better since ends the new catheter was placed.  She denies chest pain, shortness of breath, headache.  O: BP (!) 143/90 (BP Location: Left Arm)   Pulse (!) 108   Temp 98 F (36.7 C) (Oral)   Resp (!) 24   Wt 80.5 kg   SpO2 98%   BMI 30.46 kg/m   General: Sleeping, easily awoken.  Alert. CV: Tachycardic rate, regular rhythm. Pulmonary: Lungs clear to auscultation bilaterally, no respiratory distress, no tachypnea. Abdominal: Soft, mildly tender to palpation in the suprapubic region and lower quadrants. MSK: Moves arms and legs spontaneously.  Upper and lower extremity strength equal bilaterally.  A/P: Hematuria and bladder retention -this appears to have improved after switching to a 16 French catheter and subsequent periodic flushing.  Urology aware of the issues with previous catheter, but we will contact them again today for further recommendations.  Tachycardia-patient afebrile, does not feel feverish.  Is denying shortness of breath or chest pain.  Is breathing comfortably on exam.  Uncertain at this time what caused the increase in heart rate overnight, but we will get an EKG to further evaluate for arrhythmias.  White count essentially the same from 2 days ago and patient afebrile.  Unlikely to be infectious cause.  Pulmonary embolism unlikely in the setting of normal respiratory exam without increase in oxygen requirement and without chest pain.  Possibly related to her anemia, but she is otherwise hemodynamically stable and her blood pressure remained stable and elevated. -Follow-up EKG -Transfuse RBC as needed.  [name],  MD 09/19/2019, 8:35 AM PGY-3, [place] Medicine Service pager [phone]

## 2019-09-20 ENCOUNTER — Inpatient Hospital Stay (HOSPITAL_COMMUNITY): Payer: Medicare Other

## 2019-09-20 ENCOUNTER — Encounter (HOSPITAL_COMMUNITY): Payer: Medicare Other

## 2019-09-20 DIAGNOSIS — D62 Acute posthemorrhagic anemia: Secondary | ICD-10-CM | POA: Diagnosis not present

## 2019-09-20 DIAGNOSIS — I639 Cerebral infarction, unspecified: Secondary | ICD-10-CM | POA: Diagnosis not present

## 2019-09-20 DIAGNOSIS — D649 Anemia, unspecified: Secondary | ICD-10-CM | POA: Diagnosis not present

## 2019-09-20 DIAGNOSIS — I6381 Other cerebral infarction due to occlusion or stenosis of small artery: Secondary | ICD-10-CM | POA: Diagnosis not present

## 2019-09-20 DIAGNOSIS — R310 Gross hematuria: Secondary | ICD-10-CM | POA: Diagnosis not present

## 2019-09-20 DIAGNOSIS — R000 Tachycardia, unspecified: Secondary | ICD-10-CM

## 2019-09-20 DIAGNOSIS — E1149 Type 2 diabetes mellitus with other diabetic neurological complication: Secondary | ICD-10-CM | POA: Diagnosis not present

## 2019-09-20 DIAGNOSIS — R0602 Shortness of breath: Secondary | ICD-10-CM

## 2019-09-20 LAB — CBC
HCT: 22.9 % — ABNORMAL LOW (ref 36.0–46.0)
Hemoglobin: 7.5 g/dL — ABNORMAL LOW (ref 12.0–15.0)
MCH: 29.4 pg (ref 26.0–34.0)
MCHC: 32.8 g/dL (ref 30.0–36.0)
MCV: 89.8 fL (ref 80.0–100.0)
Platelets: 287 10*3/uL (ref 150–400)
RBC: 2.55 MIL/uL — ABNORMAL LOW (ref 3.87–5.11)
RDW: 14.7 % (ref 11.5–15.5)
WBC: 9.2 10*3/uL (ref 4.0–10.5)
nRBC: 0.3 % — ABNORMAL HIGH (ref 0.0–0.2)

## 2019-09-20 LAB — GLUCOSE, CAPILLARY
Glucose-Capillary: 156 mg/dL — ABNORMAL HIGH (ref 70–99)
Glucose-Capillary: 134 mg/dL — ABNORMAL HIGH (ref 70–99)
Glucose-Capillary: 114 mg/dL — ABNORMAL HIGH (ref 70–99)
Glucose-Capillary: 127 mg/dL — ABNORMAL HIGH (ref 70–99)

## 2019-09-20 LAB — COMPREHENSIVE METABOLIC PANEL
ALT: 13 U/L (ref 0–44)
AST: 17 U/L (ref 15–41)
Albumin: 2.1 g/dL — ABNORMAL LOW (ref 3.5–5.0)
Alkaline Phosphatase: 93 U/L (ref 38–126)
Anion gap: 11 (ref 5–15)
BUN: 20 mg/dL (ref 8–23)
CO2: 23 mmol/L (ref 22–32)
Calcium: 8.4 mg/dL — ABNORMAL LOW (ref 8.9–10.3)
Chloride: 99 mmol/L (ref 98–111)
Creatinine, Ser: 1.17 mg/dL — ABNORMAL HIGH (ref 0.44–1.00)
GFR calc Af Amer: 56 mL/min — ABNORMAL LOW (ref >60)
GFR calc non Af Amer: 49 mL/min — ABNORMAL LOW (ref >60)
Glucose, Bld: 127 mg/dL — ABNORMAL HIGH (ref 70–99)
Potassium: 4.9 mmol/L (ref 3.5–5.1)
Sodium: 133 mmol/L — ABNORMAL LOW (ref 135–145)
Total Bilirubin: 0.9 mg/dL (ref 0.3–1.2)
Total Protein: 4.9 g/dL — ABNORMAL LOW (ref 6.5–8.1)

## 2019-09-20 LAB — URINE CULTURE

## 2019-09-20 LAB — PREPARE RBC (CROSSMATCH)

## 2019-09-20 IMAGING — US US PELVIS LIMITED
1 series · 14 of 23 positions shown · non-contrast
Comparison: None.

CLINICAL DATA: Hematuria

EXAM:
LIMITED ULTRASOUND OF PELVIS
TECHNIQUE: Limited transabdominal ultrasound examination of the pelvis was
performed.

[Series 1: us pelvis limited (transabdominal only) · 23 acquisitions, 14 frames shown]
[im 1/23]
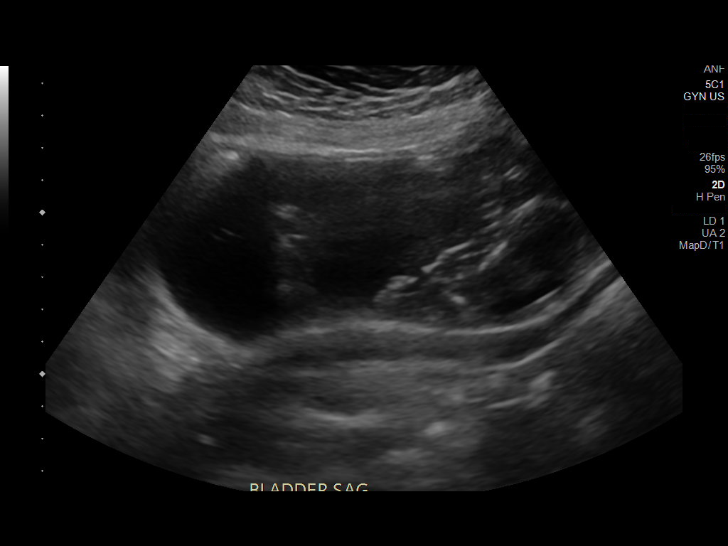
[im 3/23]
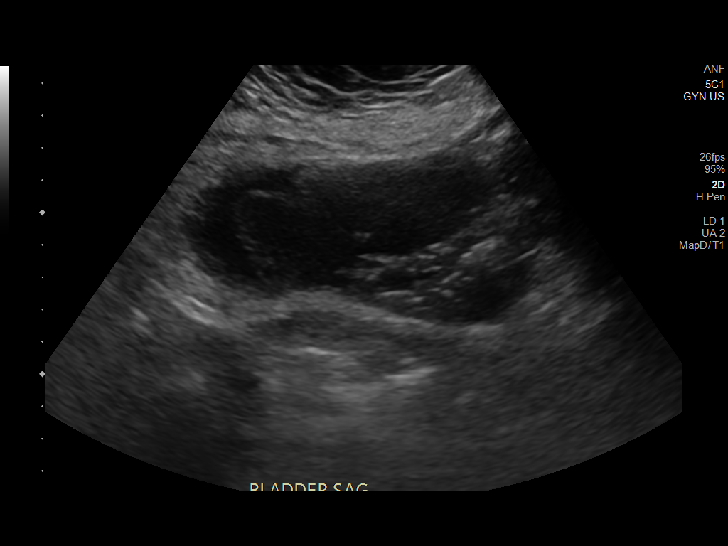
[im 5/23]
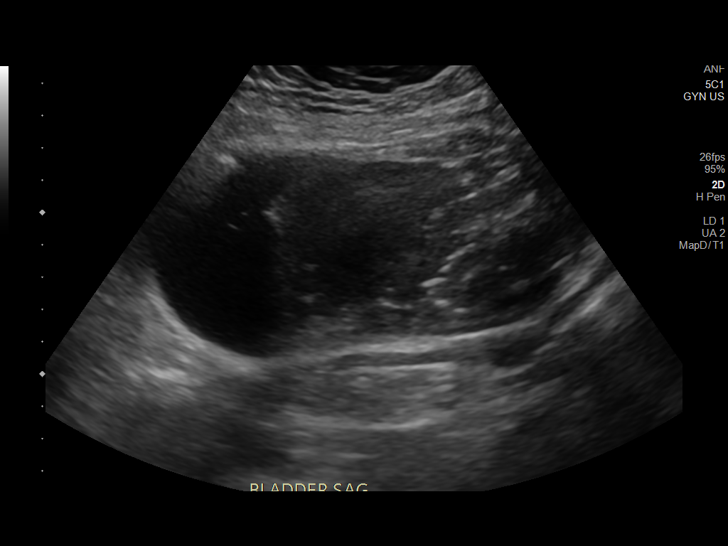
[im 6/23]
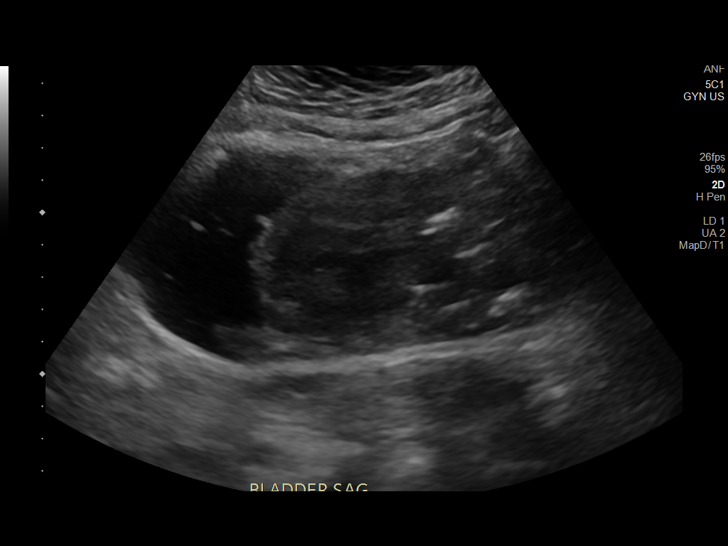
[im 8/23]
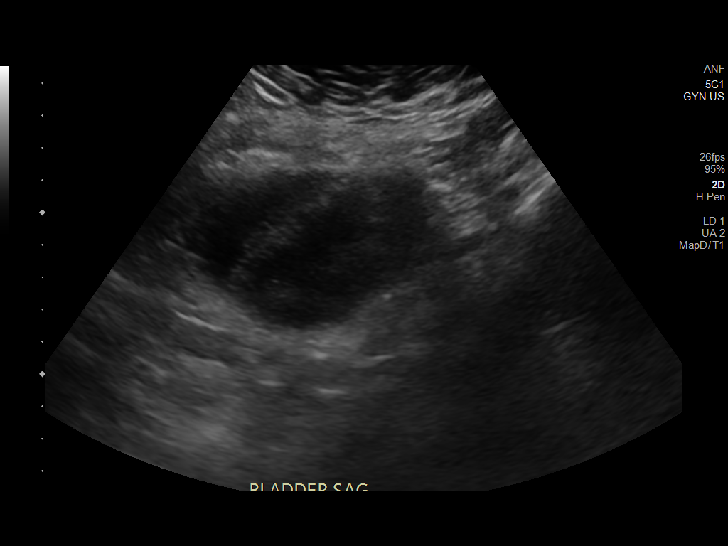
[im 10/23]
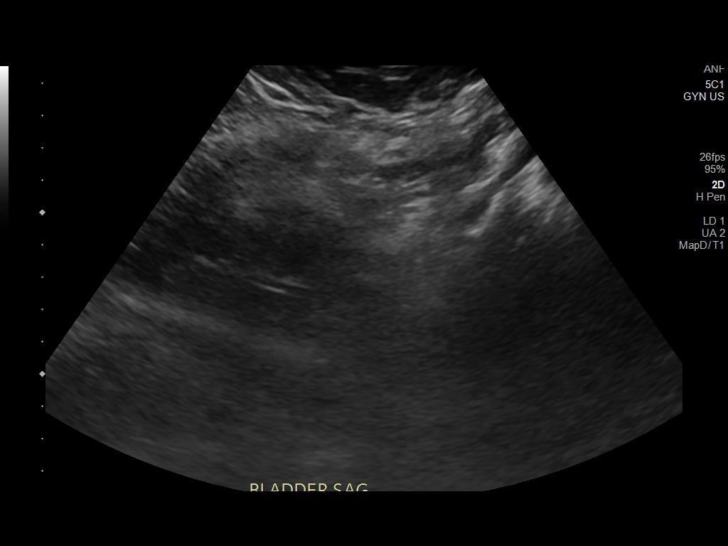
[im 11/23]
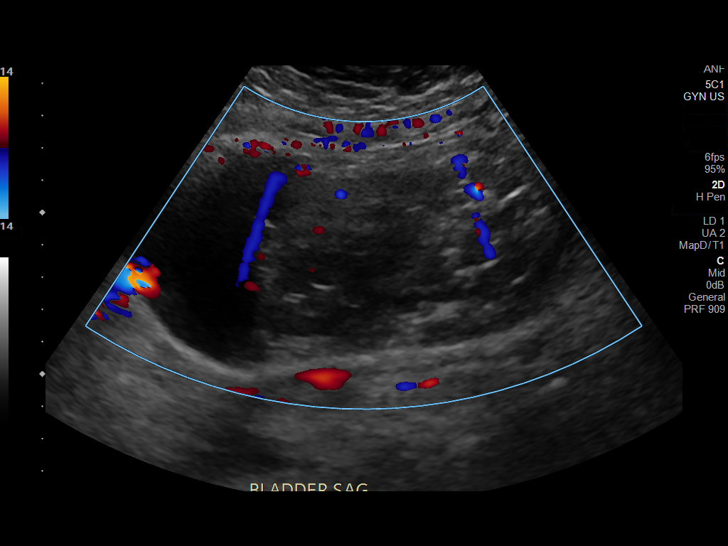
[im 13/23]
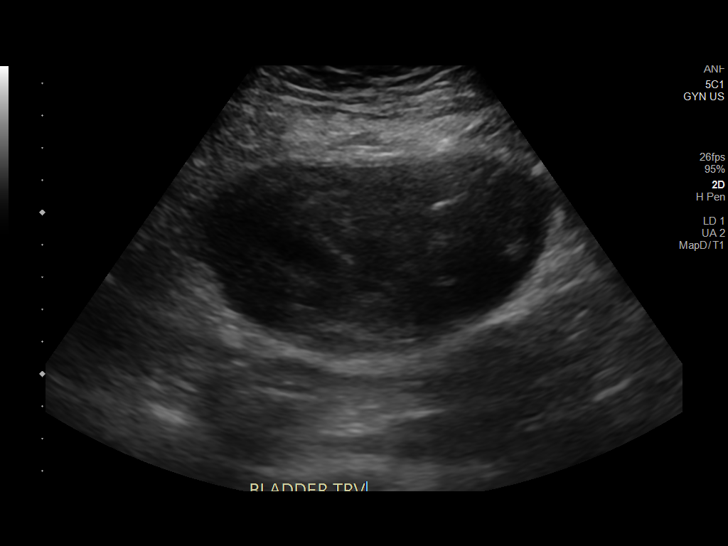
[im 14/23]
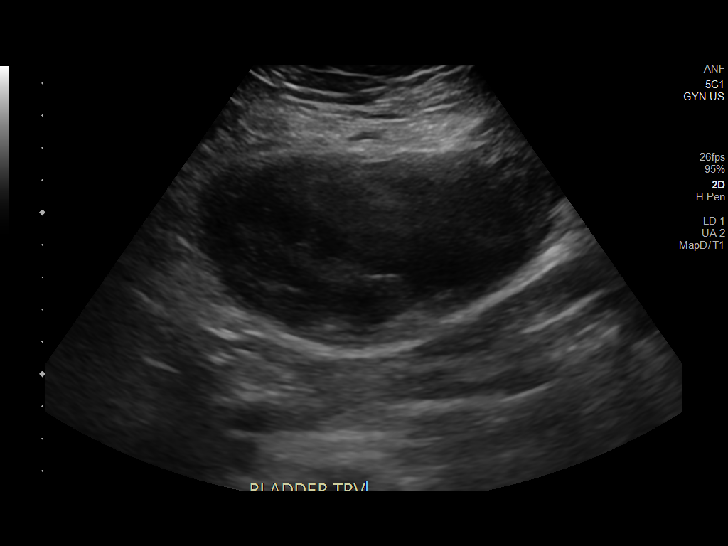
[im 16/23]
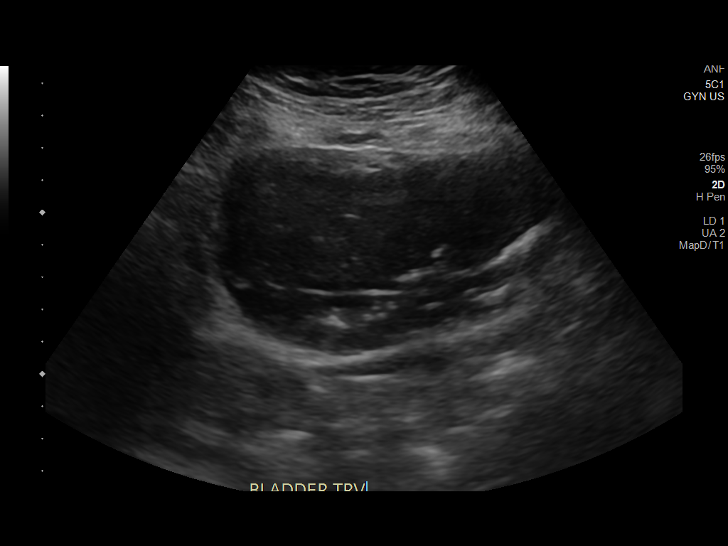
[im 18/23]
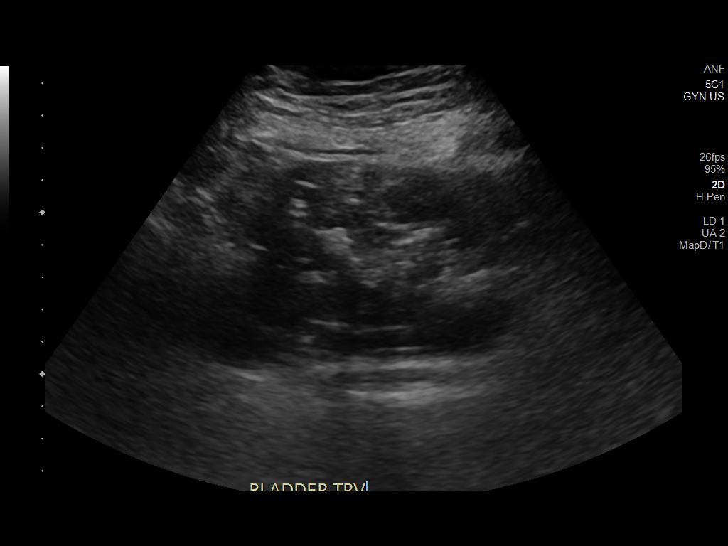
[im 19/23]
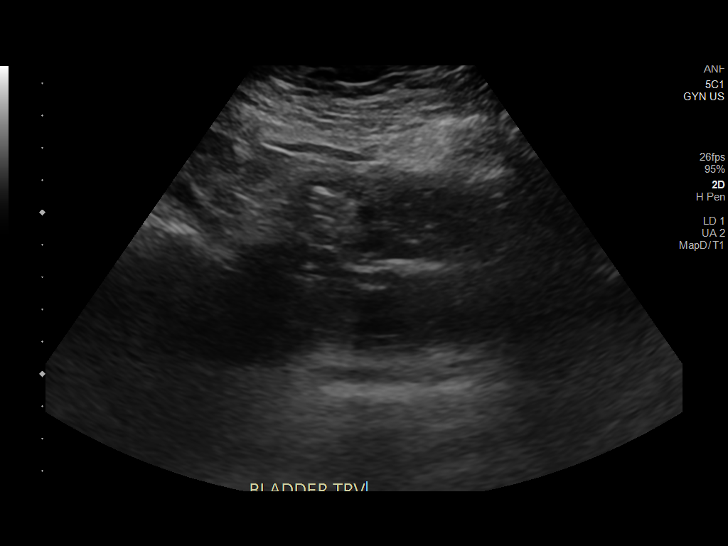
[im 21/23]
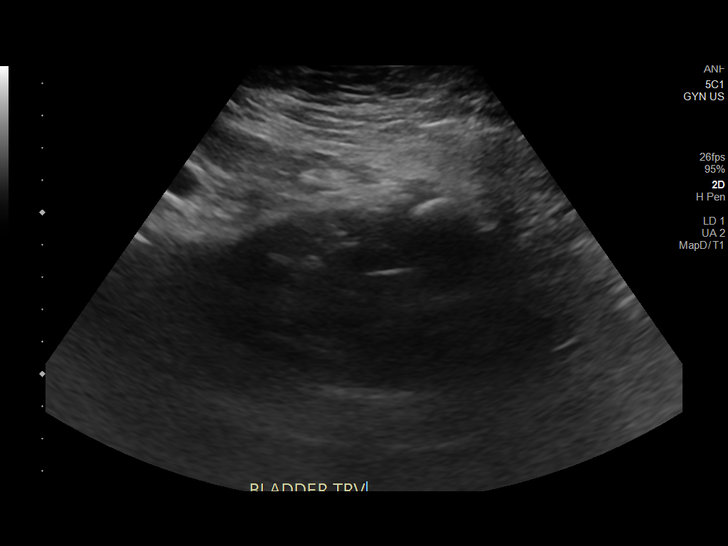
[im 23/23]
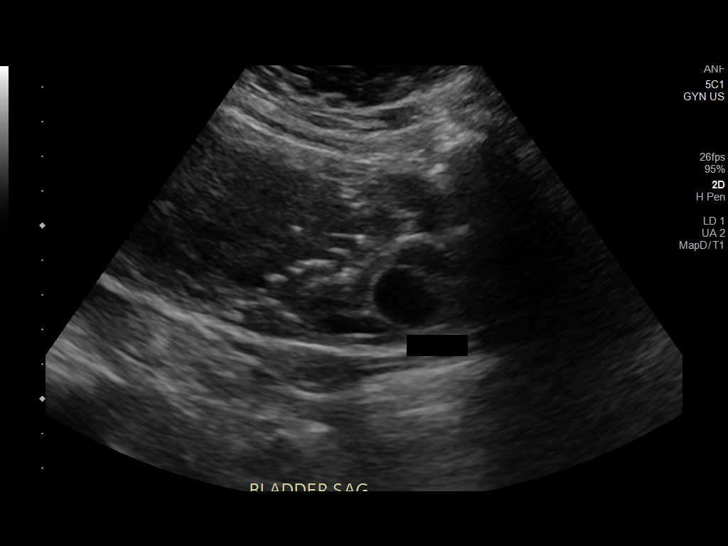

[14 of 23 positions shown; findings below may reference images not displayed]

FINDINGS: There is complex heterogeneous material seen within the urinary
bladder, likely blood clot. Foley catheter is in place. No bladder
wall thickening visualized.
IMPRESSION: Large amount of heterogeneous material within the urinary bladder,
presumably blood clot.

## 2019-09-20 MED ORDER — SODIUM CHLORIDE 0.9% IV SOLUTION: Freq: Once | INTRAVENOUS | Status: DC

## 2019-09-20 MED ORDER — HEPARIN (PORCINE) 25000 UT/250ML-% IV SOLN
1100.0000 [IU]/h | INTRAVENOUS | Status: DC
  Administered 2019-09-20: 850 [IU]/h via INTRAVENOUS
  Administered 2019-09-21: 1100 [IU]/h via INTRAVENOUS
  Filled 2019-09-20 (×2): qty 250

## 2019-09-20 MED ORDER — SODIUM CHLORIDE 0.9% IV SOLUTION: Freq: Once | INTRAVENOUS | Status: AC

## 2019-09-20 NOTE — Progress Notes (Signed)
FMTS Progress Note  In to see patient again at 10 AM. She is pleasant, alert. She responds that yes she is in [facility]. Requests we do not call husband as he is not feeling well.   Spoke with Urology, given ongoing bleeding will transfuse today (request 2 units so patient is optimized for procedure in AM). She is currently receiving unit #5 of hospitalization. They will plan for procedure at [facility] with plan for TURBT plus cauterization tomorrow (8/9) rather than 9/10. This will hopefully reduce bleeding from urinary source. Patient was amenable to ultrasound earlier this AM.   Possible Aortic Valve Abnormality, discussed Dr. Phoebe Sharps recommendation with Rod Holler. As bleeding has continued and this needs to be prioritized, recommend holding off on TEE for now. Will discuss with Cardiology. Per Dr. Erlinda Hong, results of TEE would not change management from Neurology perspective. As above, will discuss with Cardiology.   Goals of Care had discussion with sister regarding low likelihood that Priyana would return to previous very independent lifestyle. She may have some improvement. Discussed that in the context her prior elicited goals (seeking no health care previously), current multiple diagnoses (papillary carcinoma, multiple strokes, carotid disease), and uncertain prognosis, Palliative Medicine consultation with family would be helpful. This could also help clarify long term goals with family. Agreeable to consult. Consult placed, left voicemail.   All questions answered with patient and with SIL Rod Holler. Will also update [name] per patient. Patient requests we not contact husband today as he is feeling unwell.  Dorris Singh, MD  Family Medicine Teaching Service

## 2019-09-20 NOTE — Evaluation (Addendum)
Occupational Therapy Evaluation Patient Details Name: Lisa [name] MRN: [id number] DOB: 04/20/1950 Today's Date: 09/20/2019    History of Present Illness 69 year old with history of cerebrovascular disease with carotid disease s/p endovascular stent placement on 7/8, invasive papillary carcinoma of bladder, type 2 diabetes, memory impairment presenting with acute symptomatic bleeding due to urinary tract bleeding. AMS CT Head Significant for a lacunar infarct within the left lentiform nucleus; MRI brain revealed a 10 mm acute infarct in the posterior medial left thalamus    Clinical Impression   Pt admitted with above diagnoses, presenting with cognitive deficits and generalized weakness for activity. Pt with recent admission as well, now noted with new infarct. Upon arrival, pt quite agitated and distressed about her personal life and marriage. She was very tangential and difficult to redirect throughout session. Cognitive deficits noted in attention, safety, awareness, command following, and problem solving. She is very easily distracted and became tangential about staff not helping as they should. Pt very adamant about not completing mobility and how she will "be fine once she gets home". Had pt engage in grooming tasks long sitting in bed. Pt able to pull UE forward with supervision assist to enagage in brushing hair and fastening a pony tail. Due to high amounts of anxiety, etc. Issued pt crossword puzzle book to help distract from current stress. Given current status recommend HHOT vs SNF pending pt progression with therapy. Will continue to follow per POC listed below.    Follow Up Recommendations  SNF;Home health OT;Supervision/Assistance - 24 hour (pending progress)    Equipment Recommendations  None recommended by OT    Recommendations for Other Services       Precautions / Restrictions Precautions Precautions: Fall Restrictions Weight Bearing Restrictions: No      Mobility  Bed Mobility               General bed mobility comments: Refused to perform any mobility this session; was able to pull forward in long sitting with supervision for grooming tasks  Transfers                      Balance       Sitting balance - Comments: Unable to assess       Standing balance comment: Unable to assess                           ADL either performed or assessed with clinical judgement   ADL Overall ADL's : Needs assistance/impaired Eating/Feeding: Set up;Sitting   Grooming: Minimal assistance;Bed level;Cueing for sequencing Grooming Details (indicate cue type and reason): min A bed level this date due to pt adamant request. Assist from OT to brush back of hair with tangles. Noted pt having poor sequencing with oral hygiene- attempting to drink water she has just spit out Upper Body Bathing: Minimal assistance;Sitting   Lower Body Bathing: Moderate assistance;Sit to/from stand;Sitting/lateral leans   Upper Body Dressing : Minimal assistance;Sitting   Lower Body Dressing: Moderate assistance;Sit to/from stand;Sitting/lateral leans   Toilet Transfer: Minimal assistance;RW;Stand-pivot   Toileting- Clothing Manipulation and Hygiene: Minimal assistance;Sit to/from stand;Sitting/lateral lean         General ADL Comments: pt very anxious and overhwelmed about personal life, required frequent redirection and encouragement for participation     Vision Baseline Vision/History: Wears glasses Wears Glasses: At all times Patient Visual Report: No change from baseline Vision Assessment?: No apparent visual deficits  Additional Comments: no apparent deficits on eval, but difficult to assess due to impaired cognition     Perception     Praxis      Pertinent Vitals/Pain Pain Assessment: No/denies pain Pain Score: 0-No pain     Hand Dominance Right   Extremity/Trunk Assessment Upper Extremity Assessment Upper Extremity Assessment:  Generalized weakness   Lower Extremity Assessment Lower Extremity Assessment: Defer to PT evaluation RLE Deficits / Details: Bilaterally, generalized weakness. Grossly 4-/5 in quads, hamstrings, hip flexors, DF. Noted decreased DF in both ankles. Sensation in tact per pt report with light touch testing.  RLE Sensation: WNL RLE Coordination: decreased fine motor LLE Sensation: WNL LLE Coordination: decreased fine motor   Cervical / Trunk Assessment Cervical / Trunk Assessment: Normal   Communication Communication Communication: Expressive difficulties   Cognition Arousal/Alertness: Awake/alert Behavior During Therapy: Anxious;Restless Overall Cognitive Status: No family/caregiver present to determine baseline cognitive functioning Area of Impairment: Attention;Memory;Following commands;Safety/judgement;Awareness;Problem solving                   Current Attention Level: Sustained Memory: Decreased short-term memory Following Commands: Follows one step commands consistently;Follows one step commands with increased time Safety/Judgement: Decreased awareness of deficits Awareness: Emergent Problem Solving: Slow processing;Requires verbal cues General Comments: pt presenting to session very anxious and tangential about her husband and her personal life. Pt getting worked up and stating she cannot go back to him. She then is discussing how she feels she is being blamed for everything surrounding this hositalization and starts to blame OT for inadequate care (this is OTs first time meeting pt). Pt is unaware of deficits and continues to state "I will just be fine once I can get home". Also noted intermittent word finding difficulties as well   General Comments       Exercises     Shoulder Instructions      Home Living Family/patient expects to be discharged to:: Skilled nursing facility Living Arrangements: Spouse/significant other Available Help at Discharge: Family;Available  PRN/intermittently Type of Home: House Home Access: Stairs to enter [employer] of Steps: 3 Entrance Stairs-Rails: None Home Layout: Two level Alternate Level Stairs-Number of Steps: flight Alternate Level Stairs-Rails: Right Bathroom Shower/Tub: Occupational psychologist: Standard     Home Equipment: Shower seat;Grab bars - tub/shower;Grab bars - toilet   Additional Comments: Home set up and PLOF taken from prior admission as pt very resistant to therapy session and with high anxiety around her personal life and husband.   Lives With: Spouse    Prior Functioning/Environment Level of Independence: Independent        Comments: Pt reports Independence with ADLs, IADLs (cooking, laundry), and mobility without AD. Unsure of accuracy due to cognitive deficits from recent CVA        OT Problem List: Decreased strength;Impaired balance (sitting and/or standing);Impaired vision/perception;Decreased cognition;Decreased safety awareness;Decreased knowledge of use of DME or AE;Decreased knowledge of precautions;Decreased activity tolerance      OT Treatment/Interventions: Self-care/ADL training;Neuromuscular education;Therapeutic activities;Cognitive remediation/compensation;Visual/perceptual remediation/compensation;Patient/family education;Balance training;DME and/or AE instruction;Therapeutic exercise    OT Goals(Current goals can be found in the care plan section) Acute Rehab OT Goals Patient Stated Goal: to go home OT Goal Formulation: With patient Time For Goal Achievement: 10/04/19 Potential to Achieve Goals: Good ADL Goals Pt Will Perform Grooming: with min guard assist;standing Pt Will Perform Upper Body Bathing: with min assist;sitting Pt Will Perform Lower Body Bathing: with min assist;sit to/from stand;sitting/lateral leans Pt Will Transfer to  Toilet: with min assist;ambulating;regular height toilet;grab bars Additional ADL Goal #1: Pt will demonstrate  ability to sequence BADLs correctly with min VCs to maximize safety and independence  OT Frequency: Min 2X/week   Barriers to D/C:            Co-evaluation PT/OT/SLP Co-Evaluation/Treatment: Yes Reason for Co-Treatment: Complexity of the patient's impairments (multi-system involvement);Necessary to address cognition/behavior during functional activity   OT goals addressed during session: ADL's and self-care;Strengthening/ROM (cognition)      AM-PAC OT "6 Clicks" Daily Activity     Outcome Measure Help from another person eating meals?: A Little Help from another person taking care of personal grooming?: A Little Help from another person toileting, which includes using toliet, bedpan, or urinal?: A Lot Help from another person bathing (including washing, rinsing, drying)?: A Lot Help from another person to put on and taking off regular upper body clothing?: A Little Help from another person to put on and taking off regular lower body clothing?: A Lot 6 Click Score: 15   End of Session Nurse Communication: Mobility status  Activity Tolerance: Treatment limited secondary to agitation Patient left: in bed;with call bell/phone within reach;with bed alarm set  OT Visit Diagnosis: Unsteadiness on feet (R26.81);Muscle weakness (generalized) (M62.81);Other symptoms and signs involving the nervous system (R29.898);Other symptoms and signs involving cognitive function                Time: 4503-8882 OT Time Calculation (min): 29 min Charges:  OT General Charges $OT Visit: 1 Visit OT Evaluation $OT Eval Moderate Complexity: [place], MSOT, OTR/L [place] [facility] Office Number: [phone] Pager: [phone]  Zenovia Jarred 09/20/2019, 4:45 PM

## 2019-09-20 NOTE — Progress Notes (Addendum)
FPTS Interim Progress Note  S: Went to check on patient at beginning of shift.  Patient was sleepy and upset that it is difficult for her to get any sleep with all the interruptions.  Patient not having any new symptoms or worsening of her symptoms today.  She states that her bleeding is stopped.  O: BP (!) 153/72   Pulse 78   Temp 98 F (36.7 C) (Oral)   Resp 20   Wt 80.5 kg   SpO2 98%   BMI 30.46 kg/m   General: Initially sleeping, but easily awoken. GU: Gross hematuria in Foley bag.  No bleeding at the site of insertion of the Foley.  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  Given her recent history of urinary tract bleeding we will need to keep a close eye on patient for signs of recurrent bleeding.  Foley in place, with gross hematuria in the Foley bag which states patient states is "old"..  No blood at the site of insertion of the Foley.  Expect some degree of gross hematuria given her history, but if patient starts to develop more significant bleeding such as around the site of insertion of the Foley we will need to consider stopping heparin.  We will recheck patient again at 1 AM.  CBC scheduled for 2 AM.  [name], MD 09/20/2019, 10:34 PM PGY-3, [facility] pager [phone]

## 2019-09-20 NOTE — Progress Notes (Signed)
FMTS Brief Note  3358: Called family (Ms. [name] Baldassari) per patient's request to update [name]. Per prior hospitalization notes, [name] was to be healthcare POA. Patient requested we not update husband today.  Discussed bilateral DVT on ultrasound. Discussed typical therapy is anticoagulation (heparin drip in this case) but this carries significant risk of bleeding in setting of receiving 6 units pRBCs this hospital stay from GU bleeding. Discussed risks of untreated DVT and potential for PE.Discussed other therapies at times considered by surgery, including IVC filter.  Patient continues to decline CT.   1705: Discussed plan of care with Dr. Nori Riis, see her separate note.   1715: Discussed findings and plan personally with Urology, Dr. Everlena Cooper. Will plan for OR on Tuesday given complexity of case. New catheter has been inserted which can be irrigated to reduce bleeding.   1740: Updated family with Urology plans regarding timing of procedure.  Discussed benefits and risk of heparin drip, including risk of uncontrolled bleeding and risk of untreated DVT with propagation to PE. After weighing risks and discussing, [name] and family would like to proceed with heparin drip. [name] will plan to be by tomorrow afternoon to see patient, also meet with Palliative Care if possible.  All questions answered.  Dorris Singh, MD  Family Medicine Teaching Service

## 2019-09-20 NOTE — Hospital Course (Addendum)
Lisa [name] is a 69 y.o female who presented with gross hematuria and blood loss anemia in setting of papillary carcinoma of the bladder.  PMH significant for previous ischemic CVA with residual neurological defects, carotid stent placement due to carotid atherosclerosis, HTN, HLD, and type 2 diabetes. Below is her hospital course by problem list. Refer to H&P for additional information.   Acute Blood Loss Anemia 2/2 hematuria in the setting of high-grade papillary carcinoma of the bladder Hemoglobin on admission 6.9. Threshold for transfusion of 8. Received total of 9U PRBC while admitted. Patient with bladder retention of over 800cc on admission, requiring in and out cath. Bladder scans scheduled and patient with consistent retention foley catheter was placed. Urology on board. Placed 16 French catheter initially but due to pain and bladder spasms, switched to 20 [place]. Belladonna&opium suppository given PRN for bladder spasms. US Pelvis noted blood clot in bladder. Urology irrigated bladder and clot evacuated on 8/8 with 24 French catheter. Restaging TURBT at [facility] with intravesical gemcitabine. 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 Heparin gtt for b/l DVTs until patient became Hgb remained stable. Failed voiding trial, after TURBT.  Foley re-inserted for to allow for healing of bladder tumor resection.  Changed to 24 [place] three-way hematuria catheter for persistent hematuria with clotting.  Continuous bladder irrigation started.  Patient began bleeding into the Foley again and required transfusion.  Patient required cystoscopy with clot evacuation and fulguration to her with responded well and was continued on bladder irrigation post procedure.  Patient switched to home DOAC, apixaban.  Hgb remained stable. Foley removed and passed voiding trial and multiple nonbloody urines.  Declared stable for discharge to  SNF.   Acute Left Lacunar Stroke  Hx Left MCA CVA Head CT in ED significant for acute to subacute left lacunar stroke. MRI/MRA with multifocal stenosis. Home ASA and Plavix initially held due to active bleeding and anemia. Vascular surgery consulted and recommended continuing ASA 81 mg.  Repeat echo this admission did not show density. TEE was postponed per neurology and cardiology recommendations and patient with long-term anticoagulation for DVT's. Carotid US to assess stent, showed less than 50% stenosis of stent.   Bilateral Lower Extremity DVT  Presumed PE Patient had episode tachycardia to 120's and was asymptomatic with no chest pain and normal respiratory exam, satting WNL on room air. In the setting of papillary cancer, there was concern for PE. Unfortunately, patient declined CTA imaging. Lower extremity dopplers obtained which were significant for left femoral vein and right popliteal vein DVT's. Discussed with Urology and patient started on heparin gtt. With malignancy, anemia requiring multiple transfusions and now b/l DVT's, palliative consult was placed. It was decided that patient would want continued medical therapy to prolong life. She also did complete HCPOA paperwork while on admission.   AKI likey 2/2 hypovolemia  Resolved with fluid bolus. Remained stable.  Other problems chronic and stable

## 2019-09-20 NOTE — Progress Notes (Signed)
Urology Progress Note  Physician requesting consult: Lisa Singh, MD  Reason for consult: Gross hematuria, bladder cancer  Subjective: Patient more comfortable this morning, reports she slept well.   She has small amount of bloody urine drainage in catheter bag, 750cc urine output recorded for past 24 hours. Pad is soaked with urine as well.   Received 1 u pRBC yesterday, Hb 8.2 after transfusion, 7.5 this AM.  Physical Exam:  Vital signs in last 24 hours: Temp:  [97.8 F (36.6 C)-99.4 F (37.4 C)] 98.2 F (36.8 C) (08/08 1000) Pulse Rate:  [85-108] 90 (08/08 1000) Resp:  [19-22] 20 (08/08 1000) BP: (134-164)/(68-105) 149/70 (08/08 1000) SpO2:  [97 %-100 %] 100 % (08/08 1000) Constitutional:  Alert and oriented, No acute distress Cardiovascular: Regular rate and rhythm, No JVD Respiratory: Normal respiratory effort GI: Abdomen is soft, nontender, nondistended, no abdominal masses GU: 20 Fr Foley catheter in place draining merlot colored urine, significant leakage of urine around catheter on chux pad   Laboratory Data:  Recent Labs    09/18/19 0421 09/18/19 1917 09/19/19 0305 09/19/19 1655 09/20/19 0228  WBC 10.0  --  13.2*  --  9.2  HGB 7.1* 8.1* 7.5* 8.2* 7.5*  HCT 22.6* 25.0* 22.7* 24.5* 22.9*  PLT 351  --  348  --  287    Recent Labs    09/18/19 0421 09/19/19 0305 09/20/19 0228  NA 137 139 133*  K 3.1* 4.8 4.9  CL 101 104 99  GLUCOSE 124* 158* 127*  BUN 13 13 20   CALCIUM 8.5* 8.6* 8.4*  CREATININE 0.69 0.85 1.17*     Renal Function: Recent Labs    09/17/19 0907 09/18/19 0421 09/19/19 0305 09/20/19 0228  CREATININE 1.67* 0.69 0.85 1.17*   Estimated Creatinine Clearance: 48.5 mL/min (A) (by C-G formula based on SCr of 1.17 mg/dL (H)).  Impression/Recommendation: HGT1 bladder cancer Gross hematuria, clot retention  - Continue foley catheter to drainage, recommend nursing forward flush catheter with ~50cc sterile water/saline if catheter  draining poorly.  - Will repeat attempt at formal bladder ultrasound to evaluate for size of any persistent clot in the bladder.  - Will make NPO at midnight for possible TURBT tomorrow  - Agree with continuing ASA, transfusion PRN for goal Hb >8 - Belladonna&opium suppository PRN for bladder spasms  Carmie Kanner 09/20/2019, 10:18 AM

## 2019-09-20 NOTE — Progress Notes (Signed)
I have reviewed her carotid duplex which shows that her recent left carotid stent is patent with less than 50% stenosis.  Velocities on the right side of 1 to 39%.  No further vascular recommendations at this time.  Dr. Donzetta Matters will follow patient tomorrow  Lisa [name]

## 2019-09-20 NOTE — Progress Notes (Signed)
Family Medicine Teaching Service Daily Progress Note Intern Pager: [phone]  Patient name: SADIYAH KANGAS Medical record number: [id number] Date of birth: 1950/04/17 Age: 69 y.o. Gender: female  Primary Care Provider: Patient, No Pcp Per Consultants: Neurology, Vascular Surgery, Urology Code Status: FULL  Pt Overview and Major Events to Date:  09/17/2019 - admitted, received 1u PRBC 09/18/2019 - Transfused 2u PRBC 09/19/2019 - Transfused 1u PRBC 09/20/2019 - Transfused 1U PRBC 09/21/2019 - scheduled for TEE 09/22/2019 - scheduled for TransUrethral Resection of Bladder Tumor (TURBT)  Assessment and Plan: Georgi Tuel is a 69 y.o female who presented with gross hematuria and blood loss anemia in setting of papillary carcinoma of the bladder.  PMH significant for previous ischemic CVA with residual neurological defects, carotid stent placement due to carotid atherosclerosis, HTN, HLD, and type 2 diabetes.  Acute blood loss anemia 2/2 hematuria  Papillary carcinoma of the bladder: Hemoglobin on admission (8/5) 6.9>1u PRBC > 7.0 > 7.1 (8/6) >2u PRBC > 7.5 > 8.2 (8/7) > 7.5 (8/8). Currently receiving 1U PRBC.  Transfusion threshold of 8. Has [place] catheter in place. Will plan to transfuse 1 more unit of blood this afternoon in preparation for TURBT tomorrow. Patient today feels "tired". Has had trouble sleeping, does not have much of an appetite. Peanut butter sandwich ordered per patient request. - F/u post-H&H - Urology: plan for restaging TURBT, tomorrow 8/9 here at [facility] - Neurology: TEE postponed. Carotid US: Not yet performed, will f/u. - Transabdominal US ordered, will f/u - Daily CBC's, transfuse as necessary. Threshold 8 - ASA 81 mg. currently held  - Consider gentle fluids 50-75cc if patient still not eating - opium-belladonna suppository for bladder spasms per urology  - Tylenol 650 mg PO or rectally q6h mild pain, fever - Continue bladder scans as needed if concern for  retention  AKI, likely secondary to hypovolemia: acute, stable In the setting of acute blood loss anemia and decreased PO intake. Creatinine 0.85 yesterday, now 1.17.  - Hold nephrotoxic medications - Strict I/O - Consider gentle hydration IV NS 50-75cc if not eating  - Daily BMP's  Acute lacunar infarction  Hx left MCA infarct 08/12/19 Seen on imaging in ED. Still with expressive aphasia as below. - Neurology on board, appreciate recommendations - Holding ASA 81 due to active bleed with transfusion currently running - Atorvastatin 40 mg qd - Vascular surgery recommendations: US Carotid to assess stent. Would recommend ASA 81 but currently holding at this time due to transfusion - Echo from 8/7 essentially unremarkable, impression below.  - Postpone TEE  Altered mentation, memory impairment from history of multiple strokes:   Patient with word finding difficulty, able to answer yes/no questions appropriately.  -Avoiding delirium-genic medications -Planning for psychiatric capacity evaluation Monday 8/9 -SLP: recommend outpatient/home health with SLP  Hypertension: chronic, stable AM BP: 152/73. Home medications: Losartan 25mg , metoprolol 25mg , amlodipine 10mg . - Holding BP medications for now given continued bleeding  - Continue to monitor   Carotid atherosclerosis  hyperlipidemia: Patient s/p left TCAR (08/20/2019) for symptomatic left carotid stenosis.  Patient was started on aspirin and Plavix. -Continue atorvastatin 40 mg daily -Per vascular can continue aspirin, hold Plavix. We are currently holding both.  Type 2 DM: chronic, stable HbA1c: 6.5%. Home medications: Metformin 500mg . CBG's overnight 127-156. Required 2 units of insulin aspart overnight.  - Daily CBG's - sensitive SSI - holding home metformin  FEN/GI: Carb modified  PPx: SCD's, though patient has been refusing  Disposition: Inpatient  Subjective:  Mrs. Corbridge states that she is tired. She has not been  able to rest well and would like to be left alone to sleep. She doesn't complain of any abdominal pains. She has not been eating much, states she does not have an appetite. She says she has been drinking liquids. She has moved her bowels. No other complaints, just would like to "get better and go home".   Objective: Temp:  [97.8 F (36.6 C)-99.4 F (37.4 C)] 97.8 F (36.6 C) (08/08 0745) Pulse Rate:  [85-128] 85 (08/08 0745) Resp:  [16-22] 21 (08/08 0431) BP: (132-164)/(71-105) 152/73 (08/08 0745) SpO2:  [97 %-100 %] 98 % (08/08 0745) Physical Exam: General: Awake but speaking with eyes closed Cardiovascular: RRR Respiratory: CTAB  Abdomen: soft, tender to palpation RLQ, suprapubic area Extremities: no edema   Laboratory: Recent Labs  Lab 09/18/19 0421 09/18/19 1917 09/19/19 0305 09/19/19 1655 09/20/19 0228  WBC 10.0  --  13.2*  --  9.2  HGB 7.1*   < > 7.5* 8.2* 7.5*  HCT 22.6*   < > 22.7* 24.5* 22.9*  PLT 351  --  348  --  287   < > = values in this interval not displayed.   Recent Labs  Lab 09/18/19 0421 09/19/19 0305 09/20/19 0228  NA 137 139 133*  K 3.1* 4.8 4.9  CL 101 104 99  CO2 22 23 23   BUN 13 13 20   CREATININE 0.69 0.85 1.17*  CALCIUM 8.5* 8.6* 8.4*  PROT 5.1* 5.2* 4.9*  BILITOT 1.1 1.2 0.9  ALKPHOS 67 94 93  ALT 11 16 13   AST 16 26 17   GLUCOSE 124* 158* 127*     Imaging/Diagnostic Tests: Echo 09/19/19 IMPRESSIONS  1. Left ventricular ejection fraction, by estimation, is 60 to 65%. The  left ventricle has normal function. The left ventricle has no regional  wall motion abnormalities. Left ventricular diastolic parameters are  consistent with Grade I diastolic  dysfunction (impaired relaxation).  2. Right ventricular systolic function is moderately reduced. The right  ventricular size is normal. There is normal pulmonary artery systolic  pressure.  3. The mitral valve is normal in structure. No evidence of mitral valve  regurgitation.  4.  The aortic valve is normal in structure. Aortic valve regurgitation is  not visualized. No aortic stenosis is present.   Sharion Settler, DO 09/20/2019, 8:10 AM PGY-1, [place] Intern pager: [phone], text pages welcome

## 2019-09-20 NOTE — Progress Notes (Signed)
VASCULAR LAB PRELIMINARY  PRELIMINARY  PRELIMINARY  PRELIMINARY  Bilateral lower extremity venous duplex completed.    Preliminary report:  See CV proc for preliminary results.  Called Dr. Nita Sells with results.  Takera Rayl, RVT 09/20/2019, 4:30 PM

## 2019-09-20 NOTE — Progress Notes (Signed)
ANTICOAGULATION CONSULT NOTE - Initial Consult  Pharmacy Consult for IV heparin Indication: new DVT, presumed PE  No Known Allergies  Patient Measurements: Weight: 80.5 kg (177 lb 7.5 oz) Heparin Dosing Weight: 80kg  Vital Signs: Temp: 98.5 F (36.9 C) (08/08 1715) Temp Source: Oral (08/08 1715) BP: 150/63 (08/08 1715) Pulse Rate: 82 (08/08 1715)  Labs: Recent Labs    09/18/19 0421 09/18/19 1917 09/19/19 0305 09/19/19 0305 09/19/19 1655 09/20/19 0228  HGB 7.1*   < > 7.5*   < > 8.2* 7.5*  HCT 22.6*   < > 22.7*  --  24.5* 22.9*  PLT 351  --  348  --   --  287  CREATININE 0.69  --  0.85  --   --  1.17*   < > = values in this interval not displayed.    Estimated Creatinine Clearance: 48.5 mL/min (A) (by C-G formula based on SCr of 1.17 mg/dL (H)).   Medical History: Past Medical History:  Diagnosis Date  . Carotid arterial disease ([place]) 07/2019  . Diabetes ([place])   . HTN (hypertension)   . Hyperlipidemia   . Stroke [facility])    acute/subacute left MCA infarct 08/12/19    Assessment: 69 yo female with complex history including bladder cancer + hematuria and anemia, but new DVTs with presumed PE.  Pharmacy asked to begin IV heparin.   Goal of Therapy:  Will target heparin level 0.3-0.5  (offical therapeutic range 0.3-0.7, but will target lower end given risk of bleeding) Monitor platelets by anticoagulation protocol: Yes   Plan:  Start IV heparin 850 units/hr. Check heparin level in 6 hrs. Daily heparin level and CBC. Hold heparin prior to OR.  [place] Crane, Roylene Reason, BCCP Clinical Pharmacist  09/20/2019 7:21 PM   [facility] pharmacy phone numbers are listed on [email or web address]

## 2019-09-20 NOTE — Progress Notes (Signed)
FMTS Interim Note  Patient seen early this AM. She is easily awakened, responds to simple questions. Cannot state name or specific hospital. She does report this AM she knows she is here for bleeding from her 'lower belly'. Discussed with her undergoing CT today, she is certain she does not want this.   Tachycardia, resolved and none on telemetry overnight. No hypoxemia. Suspicion for PE lower, given normal pulmonary pressures on echocardiogram, will work with patient and undergo DVT ultrasound today. This is to evaluate for DVT rather than PE. If positive, this would significantly change management and significantly raise concern for PE. If negative, this further reduces (although does not completely eliminate as I discussed with the patient) the possibility of PE.  If becomes tachycardic or hypoxemic, will pursue CT regardless. Will discuss with family today as well.  Anemia, due GU bleeding, will hold aspirin. Hgb 7.5, transfuse X1.   Stroke, discussed TEE with Neurology yesterday, will discuss with Cardiology,  family and patient today. As she continues to require transfusions, it may be best to wait until after OR on Tuesday (in hopes of reducing bleeding and need for transfusion) and further consider TEE.   Full note to follow.  Dorris Singh, MD  Family Medicine Teaching Service

## 2019-09-20 NOTE — Progress Notes (Signed)
   Bladder US this afternoon with continued clot in bladder. Pt with bilateral DVT and need to start heparin gtt.   Dr. Everlena Cooper and I removed the patient's foley catheter. She was prepped and draped and a 24Fr hematuria catheter placed. Clots were evacuated until she was clear. There is no active bleeding. Catheter left to gravity drainage.   A/P:  Gross hematuria, clot retention - resolved. OK to start heparin gtt from GU pt of view if needed for newly diagnosed DVT. Plan to stop heparin prior to OR Tuesday.   HG T1 bladder cancer - pt needs restaging TURBT and given CVA and DVT best to keep plan for Tuesday  and coordinate during this hospitalization.

## 2019-09-20 NOTE — Progress Notes (Signed)
STROKE TEAM PROGRESS NOTE   INTERVAL HISTORY No family at bedside. Pt awake alert, interactive, still has expressive aphasia and right facial droop. No arm or leg weakness. Still has hematuria, plan for urology procedure tomorrow. TEE will be postponed which is OK from neuro standpoint.    Vitals:   09/19/19 1530 09/19/19 1934 09/19/19 2333 09/20/19 0431  BP: 137/82 (!) 141/76 134/71 (!) 153/79  Pulse: 96 89 89 91  Resp: 20 20 (!) 21 (!) 21  Temp: 99.4 F (37.4 C) 98.6 F (37 C) 98.2 F (36.8 C) 98 F (36.7 C)  TempSrc: Oral Axillary Oral Oral  SpO2: 99% 98% 97% 97%  Weight:       CBC:  Recent Labs  Lab 09/19/19 0305 09/19/19 0305 09/19/19 1655 09/20/19 0228  WBC 13.2*  --   --  9.2  HGB 7.5*   < > 8.2* 7.5*  HCT 22.7*   < > 24.5* 22.9*  MCV 88.3  --   --  89.8  PLT 348  --   --  287   < > = values in this interval not displayed.   Basic Metabolic Panel:  Recent Labs  Lab 09/19/19 0305 09/20/19 0228  NA 139 133*  K 4.8 4.9  CL 104 99  CO2 23 23  GLUCOSE 158* 127*  BUN 13 20  CREATININE 0.85 1.17*  CALCIUM 8.6* 8.4*   Lipid Panel:  Recent Labs  Lab 09/18/19 0421  CHOL 94  TRIG 82  HDL 32*  CHOLHDL 2.9  VLDL 16  LDLCALC 46   HgbA1c:  Recent Labs  Lab 09/18/19 0421  HGBA1C 6.5*   Urine Drug Screen: No results for input(s): LABOPIA, COCAINSCRNUR, LABBENZ, AMPHETMU, THCU, LABBARB in the last 168 hours.  Alcohol Level No results for input(s): ETH in the last 168 hours.  IMAGING past 24 hours ECHOCARDIOGRAM COMPLETE  Result Date: 09/19/2019    ECHOCARDIOGRAM REPORT   Patient Name:   Lisa [name] [facility] Date of Exam: 09/19/2019 Medical Rec #:  [id number]         Height:       64.0 in Accession #:    [id number]        Weight:       177.5 lb Date of Birth:  July 16, 1950         BSA:          1.859 m Patient Age:    69 years          BP:           143/90 mmHg Patient Gender: F                 HR:           118 bpm. Exam Location:  Inpatient Procedure: 2D Echo,  Cardiac Doppler and Color Doppler Indications:    Stroke 434.91 / I163.9  History:        Patient has prior history of Echocardiogram examinations, most                 recent 08/13/2019. Carotid Disease; Risk Factors:Hypertension,                 Diabetes and Dyslipidemia.  Sonographer:    Jonelle Sidle Dance Referring Phys: [id number]  1. Left ventricular ejection fraction, by estimation, is 60 to 65%. The left ventricle has normal function. The left ventricle has no regional wall motion abnormalities. Left ventricular diastolic parameters are consistent  with Grade I diastolic dysfunction (impaired relaxation).  2. Right ventricular systolic function is moderately reduced. The right ventricular size is normal. There is normal pulmonary artery systolic pressure.  3. The mitral valve is normal in structure. No evidence of mitral valve regurgitation.  4. The aortic valve is normal in structure. Aortic valve regurgitation is not visualized. No aortic stenosis is present. FINDINGS  Left Ventricle: Left ventricular ejection fraction, by estimation, is 60 to 65%. The left ventricle has normal function. The left ventricle has no regional wall motion abnormalities. The left ventricular internal cavity size was normal in size. There is  no left ventricular hypertrophy. Left ventricular diastolic parameters are consistent with Grade I diastolic dysfunction (impaired relaxation). Right Ventricle: The right ventricular size is normal. No increase in right ventricular wall thickness. Right ventricular systolic function is moderately reduced. There is normal pulmonary artery systolic pressure. The tricuspid regurgitant velocity is 2.78 m/s, and with an assumed right atrial pressure of 3 mmHg, the estimated right ventricular systolic pressure is 81.1 mmHg. Left Atrium: Left atrial size was normal in size. Right Atrium: Right atrial size was normal in size. Pericardium: There is no evidence of pericardial effusion.  Mitral Valve: The mitral valve is normal in structure. No evidence of mitral valve regurgitation. Tricuspid Valve: The tricuspid valve is grossly normal. Tricuspid valve regurgitation is trivial. Aortic Valve: The aortic valve is normal in structure. Aortic valve regurgitation is not visualized. No aortic stenosis is present. Pulmonic Valve: The pulmonic valve was grossly normal. Pulmonic valve regurgitation is not visualized. Aorta: The aortic root and ascending aorta are structurally normal, with no evidence of dilitation. IAS/Shunts: The atrial septum is grossly normal.  LEFT VENTRICLE PLAX 2D LVIDd:         3.60 cm  Diastology LVIDs:         2.60 cm  LV e' lateral: 7.65 cm/s LV PW:         0.90 cm  LV e' medial:  6.34 cm/s LV IVS:        1.00 cm LVOT diam:     1.80 cm LV SV:         41 LV SV Index:   22 LVOT Area:     2.54 cm  RIGHT VENTRICLE             IVC RV Basal diam:  2.10 cm     IVC diam: 1.70 cm RV S prime:     15.10 cm/s TAPSE (M-mode): 1.5 cm LEFT ATRIUM           Index       RIGHT ATRIUM          Index LA diam:      2.60 cm 1.40 cm/m  RA Area:     8.49 cm LA Vol (A2C): 37.6 ml 20.22 ml/m RA Volume:   14.00 ml 7.53 ml/m LA Vol (A4C): 12.8 ml 6.86 ml/m  AORTIC VALVE LVOT Vmax:   112.50 cm/s LVOT Vmean:  78.400 cm/s LVOT VTI:    0.162 m  AORTA Ao Root diam: 3.40 cm Ao Asc diam:  3.50 cm MV A velocity: 117.50 cm/s  TRICUSPID VALVE                             TR Peak grad:   30.9 mmHg  TR Vmax:        278.00 cm/s                              SHUNTS                             Systemic VTI:  0.16 m                             Systemic Diam: 1.80 cm Mertie Moores MD Electronically signed by Mertie Moores MD Signature Date/Time: 09/19/2019/1:33:55 PM    Final      Echocardiogram 09/19/19 IMPRESSIONS  1. Left ventricular ejection fraction, by estimation, is 60 to 65%. The  left ventricle has normal function. The left ventricle has no regional  wall motion abnormalities.  Left ventricular diastolic parameters are  consistent with Grade I diastolic  dysfunction (impaired relaxation).  2. Right ventricular systolic function is moderately reduced. The right  ventricular size is normal. There is normal pulmonary artery systolic  pressure.  3. The mitral valve is normal in structure. No evidence of mitral valve  regurgitation.  4. The aortic valve is normal in structure. Aortic valve regurgitation is  not visualized. No aortic stenosis is present.   PHYSICAL EXAM  Temp:  [98 F (36.7 C)-99.4 F (37.4 C)] 98 F (36.7 C) (08/08 0431) Pulse Rate:  [89-128] 91 (08/08 0431) Resp:  [16-24] 21 (08/08 0431) BP: (132-164)/(71-105) 153/79 (08/08 0431) SpO2:  [97 %-100 %] 97 % (08/08 0431)  General - Well nourished, well developed, not in acute distress.  Ophthalmologic - fundi not visualized due to noncooperation.  Cardiovascular - Regular rhythm and rate.  Neuro - awake alert interactive, orientated to place, but answered age, time were wrong. Expressive aphasia with frequent paraphasic errors and hesitancy of speech, intermittent word salad.  No dysarthria. Extraocular movements full range without nystagmus. Visual field full. Right lower facial weakness.  Tongue midline.  Motor system exam shows symmetric upper and lower extremity strength without drift. Sensation symmetrical, coordination intact FTN bilaterally. Gait not tested.   ASSESSMENT/PLAN Lisa [name] is a 69 y.o. female with history of prior stroke in June 2021, intracranial and extracranial atherosclerotic disease with 80 to 99% left ICA stenosis on dual antiplatelet therapy with aspirin and Plavix, diabetes, hypertension, hyperlipidemia, and known bladder cancer presenting with weakness and gross hematuria resulting in symptomatic anemia.   Stroke: L thalamic infarct in setting of intracranial stenosis and symptomatic anemia due to hematuria  CT head L lentiform nucleus lacunar infarct,  new from MRI 08/12/2019. Recent L MCA territory infarct w/ cortical laminar necrosis. Small vessel disease. Atrophy.   MRI  Acute/subacute posteromedial L thalamic infarct. Late subacute subcentimeter subcortical posterior L frontal lobe infarct. L lentiform nucleus lacune new since 08/12/19. chronic large L MCA territory infarct w/ mild cortical enhancement  MRA  No LVO. Multifocal stenoses:  L M1 mild / moderate, proximal superior and inferior division L M2 branches moderate, mid L M2 inferior division high-grade focal, proximal to mid superior division R M2 severe, R PCA at P2/P3 jxn moderate to moderate severe, L P3 and distal LPCA moderate    Carotid US - pending  2D Echo - 09/19/19 - EF 60 - 65%. No cardiac source of emboli identified.   TEE - postponed, may consider once pt medically  more stable  LDL 158  HgbA1c 8.2  VTE prophylaxis - SCDs   aspirin 81 mg daily and clopidogrel 75 mg daily prior to admission, now on ASA 81 due to hematuria.   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   OK with ASA per urology  Severe anemia   Hgb - 6.9->7.1->8.1->7.5  Received PRBC 8/7  On ASA   CBC monitoring  Hx of recent stroke  08/2019 left MCA infarct, CT left MCA infarct, CTA head and neck left ICA bulb near occlusion, left M2 occlusion and M1 stenosis. B/l P2/P3 stenosis. MRI left MCA subacute infarct. CUS left ICA 80-99% stenosis. 2D echo concerning for AV mobile density, EF 60-65%. LDL 158 and A1C 8.2. UDS + THC. Put on DAPT and statin. VVS recommend TCAR as outpt and plan for TEE as outpt  Carotid Stenosis, Left  S/p left TCAR w/ stent 7/8 [facility]). d/c on aspirin, plavix and statin.   CUS pending  Multifocal intracranial stenosis  CTA head slow flow L M2  and distal L MCA vessels w/ superior and inferior division proximal L M2 branch occlusions. L M1 moderate stenosis. R M2 proximal to superior division moderate to severe focal stenosis. R A2 distal moderate focal stenosis. R P2/P3 high-grade focal stenosis. L P2/P3 moderate severe focal stenosis.  MRA  No LVO. Multifocal stenoses:  L M1 mild / moderate, proximal superior and inferior division L M2 branches moderate, mid L M2 inferior division high-grade focal, proximal to mid superior division R M2 severe, R PCA at P2/P3 jxn moderate to moderate severe, L P3 and distal LPCA moderate    On DAPT PTA  Current thalamic infarct likely due to PCA stenosis in the setting of symptomatic anemia  Now on ASA 81 only given hematuria and anemia  Avoid low BP  AV mobile density  ?? limban-Sacks endocarditis given malignancy??  2D Echo 08/2019 linear mobile density 0.6cm on aortic valve. EF 60-65%   TEE recommended last admission, pt wanted to do as outpt. Not yet done  Repeat 2D this time showed AV is normal in structure  TEE postponed given pt not long term AC candidate so will not change management - may consider once pt medically more stable  ? PE  Could be due to hypercoagulable state given malignancy  Pt declined CTA chest at this time  LE venous doppler pending  OK from heparin IV if needed from neuro standpoint given small infarct this time  Hypertension  Stable . Permissive hypertension (OK if < 220/120)  . SBP goal normotensive . Avoid low BP  Hyperlipidemia  Home meds:  lipitor 80 mg daily  Dose decreased to 40 mg daily on admission  LDL 46, goal < 70  Continue statin at discharge  Diabetes type II, controlled  HgbA1c 6.5, goal < 7.0  SSI  CBGs  PCP follow up  Tobacco abuse  Current smoker  Smoking cessation counseling will be provided  Other Stroke Risk Factors  Advanced age  Hx ETOH use, advised to drink no more than 1 drink(s) a day  Hx  Substance abuse - THC  Obesity, Body mass index is 30.46 kg/m., recommend weight loss, diet and exercise as appropriate   Other Active Problems    Hospital day #  [place], MD PhD Stroke Neurology 09/20/2019 3:09 PM    To contact Stroke Continuity provider, please refer to [email or web address] After hours, contact General Neurology

## 2019-09-20 NOTE — Evaluation (Signed)
Physical Therapy Evaluation Patient Details Name: Lisa [name] MRN: [id number] DOB: 15-Jul-1950 Today's Date: 09/20/2019   History of Present Illness  69 year old with history of cerebrovascular disease with carotid disease s/p endovascular stent placement on 7/8, invasive papillary carcinoma of bladder, type 2 diabetes, memory impairment presenting with acute symptomatic bleeding due to urinary tract bleeding. AMS CT Head Significant for a lacunar infarct within the left lentiform nucleus; MRI brain revealed a 10 mm acute infarct in the posterior medial left thalamus   Clinical Impression  Pt admitted with above diagnosis. At the time of PT eval pt with high anxiety, perseverating on facts such as staff blaming her for "things that have gone wrong" during her admission, and details of her personal life. She declined any mobility this session and we weren't able to truly assess her functional status. She was agreeable to some ADL's, and washed face, brushed teeth, and attempted to brush hair. She was agreeable to LE strength testing (modified MMT) in bed however continued to decline sitting up EOB for any assessment. Pt states she will not return home with husband but will need caregivers for "a couple weeks" to help her when she discharges. Based on performance today and pt's wishes, recommend 24 hour assistance with HHPT to follow-up. If pt does not improve next session or agree to participate with functional mobility, may want to consider a higher level of care. Pt currently with functional limitations due to the deficits listed below (see PT Problem List). Pt will benefit from skilled PT to increase their independence and safety with mobility to allow discharge to the venue listed below.    Follow Up Recommendations Home Health PT; 24 hour supervision/assistance    Equipment Recommendations  None recommended by PT    Recommendations for Other Services       Precautions / Restrictions  Precautions Precautions: Fall Restrictions Weight Bearing Restrictions: No      Mobility  Bed Mobility               General bed mobility comments: Refused to perform any mobility this session  Transfers                    Ambulation/Gait                Stairs            Wheelchair Mobility    Modified Rankin (Stroke Patients Only) Modified Rankin (Stroke Patients Only) Pre-Morbid Rankin Score: Moderately severe disability Modified Rankin: Moderately severe disability     Balance       Sitting balance - Comments: Unable to assess       Standing balance comment: Unable to assess                             Pertinent Vitals/Pain Pain Assessment: 0-10 Pain Score: 0-No pain    Home Living Family/patient expects to be discharged to:: Skilled nursing facility Living Arrangements: Spouse/significant other Available Help at Discharge: Family;Available PRN/intermittently Type of Home: House Home Access: Stairs to enter Entrance Stairs-Rails: None Entrance Stairs-Number of Steps: 3 Home Layout: Two level Home Equipment: Shower seat;Grab bars - tub/shower;Grab bars - toilet Additional Comments: Home set up and PLOF taken from prior admission as pt very resistant to therapy session and with high anxiety around her personal life and husband.     Prior Function Level of Independence: Independent  Comments: Pt reports Independence with ADLs, IADLs (cooking, laundry), and mobility without AD. Unsure of accuracy due to cognitive deficits from recent CVA     Hand Dominance   Dominant Hand: Right    Extremity/Trunk Assessment   Upper Extremity Assessment Upper Extremity Assessment: Defer to OT evaluation    Lower Extremity Assessment Lower Extremity Assessment: RLE deficits/detail;LLE deficits/detail RLE Deficits / Details: Bilaterally, generalized weakness. Grossly 4-/5 in quads, hamstrings, hip flexors, DF. Noted  decreased DF in both ankles. Sensation in tact per pt report with light touch testing.  RLE Sensation: WNL RLE Coordination: decreased fine motor LLE Sensation: WNL LLE Coordination: decreased fine motor    Cervical / Trunk Assessment Cervical / Trunk Assessment: Normal  Communication   Communication: Expressive difficulties  Cognition Arousal/Alertness: Awake/alert Behavior During Therapy: Anxious Overall Cognitive Status: No family/caregiver present to determine baseline cognitive functioning Area of Impairment: Following commands;Memory;Safety/judgement;Problem solving                   Current Attention Level: Sustained Memory: Decreased short-term memory Following Commands: Follows one step commands consistently Safety/Judgement: Decreased awareness of deficits Awareness: Emergent          General Comments      Exercises     Assessment/Plan    PT Assessment Patient needs continued PT services  PT Problem List Decreased strength;Decreased balance;Decreased mobility;Decreased cognition;Decreased knowledge of use of DME;Decreased safety awareness;Decreased activity tolerance;Decreased knowledge of precautions       PT Treatment Interventions Therapeutic activities;Cognitive remediation;Gait training;Therapeutic exercise;Patient/family education;Stair training;Balance training;Functional mobility training;Neuromuscular re-education    PT Goals (Current goals can be found in the Care Plan section)  Acute Rehab PT Goals Patient Stated Goal: to go home PT Goal Formulation: With patient Time For Goal Achievement: 09/14/19 Potential to Achieve Goals: Good    Frequency Min 3X/week   Barriers to discharge Other (comment) Unsure of available caregiver support. Pt adamant that she does not want to be in the same house as her husband. Mentions going to live in a house she owns in [place] where she will have caregivers but did not elaborate on this.     Co-evaluation  PT/OT/SLP Co-Evaluation/Treatment: Yes Reason for Co-Treatment: Complexity of the patient's impairments (multi-system involvement);Necessary to address cognition/behavior during functional activity;To address functional/ADL transfers;Other (comment) (Behavior)           AM-PAC PT "6 Clicks" Mobility  Outcome Measure Help needed turning from your back to your side while in a flat bed without using bedrails?: A Little Help needed moving from lying on your back to sitting on the side of a flat bed without using bedrails?: A Lot Help needed moving to and from a bed to a chair (including a wheelchair)?: A Lot Help needed standing up from a chair using your arms (e.g., wheelchair or bedside chair)?: A Lot Help needed to walk in hospital room?: A Lot Help needed climbing 3-5 steps with a railing? : A Lot 6 Click Score: 13    End of Session   Activity Tolerance: Other (comment) (Self-limiting) Patient left: in bed;Other (comment) (OT and MD present in room) Nurse Communication: Other (comment) (Touched base with RN at end of session, did not mobilize) PT Visit Diagnosis: Difficulty in walking, not elsewhere classified (R26.2);Other symptoms and signs involving the nervous system (R29.898)    Time: 4627-0350 PT Time Calculation (min) (ACUTE ONLY): 27 min   Charges:   PT Evaluation $PT Eval High Complexity: 1 High  Rolinda Roan, PT, DPT Acute Rehabilitation Services Pager: [phone] Office: [place] 09/20/2019, 3:15 PM

## 2019-09-20 NOTE — Progress Notes (Signed)
VASCULAR LAB    Carotid duplex completed.    Preliminary report:  See CV proc for preliminary results.  Roisin Mones, RVT 09/20/2019, 4:31 PM

## 2019-09-20 NOTE — Progress Notes (Signed)
FPTS Interim Progress Note  Discussed need to cancel TEE originally scheduled for 9/9 with Dr. Acie Fredrickson on call for cardiology. Also discussed whether the results of a transesophageal echo would change management plans from a cardiology perspective and was recommended that this decision will be deferred to neurology in the setting of recent stroke. Upon review of notes from Dr. Erlinda Hong, this study would likely not change management.   Lisa Foster, MD 09/20/2019, 2:35 PM PGY-2, [place] Medicine Service pager [phone]

## 2019-09-20 NOTE — Progress Notes (Signed)
CALL PAGER [phone] for any questions or notifications regarding this patient  FMTS Attending Note: Lisa Mcmurray MD Dr. Owens Shark caled me for an additional opinion late this afternoon. We had discussed this case severl times since admission. Unfortunately for Lisa [name], her clinical picture is quite complex and there are no easy answer sto her current situation. After our discussion today, I think the main issues are as follows; 1. Known high grade invasive papillary bladder cancer and acute blood loss anemia: Patient admitted with gross hematuria, presumed to be from her bladder tumor.  Has required multiple unites of PRBC to treat her acute blood loss anemia. Urology did [place] this afternoon that showed continued clot and they evacuated the bladder with replacement of the foley. Per Urology, they agree with heparin gtt if needed for treatment of presumed pulmnary embolus. 2. Presumed pulmonary embolus with bilateral DVT (femoralon right and peroneal on left) ) on preliminary ultrasound of lower extremities. Extremely unlikely that the preliminary report will be inaccurate given the extensiveness of both clots. Given her acute symptoms of tachycardia, it is extremely likely she has some clot burden of pulmonary emboli. Given the l;ack of heart strain on ECHO, it is less likely massive PE.  3. Anticoagulation in setting of acute blood loss anemia and multiple recent CVA and presumed submassive pulmonary embolus..The question that is difficult is whether or not to anticoagulate Lisa [name] with heparin gtt at this time. Urology has tentatively planned another procedure in OR early this week (Tuesday per their note).  The risk of further bleeding seems to me to be almost exactly the risk of further sequelae from DVT/PE/CVA.  I have discussed this at length with Dr. Owens Shark at her request for peer opinion. I see no clear answer unfortunately.  Dr. Owens Shark will continue to discuss with family. 4. CODE STATU/Family:There are  also some difficulties with family discussion per report. The patient is full code,seems to have full capacity and now wishes the  medical team to stop any further relay of information to her husband att his time.

## 2019-09-21 ENCOUNTER — Encounter (HOSPITAL_COMMUNITY)
Admission: EM | Disposition: A | Discharge status: Skilled Nursing Facility | Payer: Self-pay | Source: Home / Self Care | Attending: Family Medicine

## 2019-09-21 ENCOUNTER — Other Ambulatory Visit (HOSPITAL_COMMUNITY): Payer: Medicare Other

## 2019-09-21 DIAGNOSIS — I6381 Other cerebral infarction due to occlusion or stenosis of small artery: Secondary | ICD-10-CM | POA: Diagnosis not present

## 2019-09-21 DIAGNOSIS — R4701 Aphasia: Secondary | ICD-10-CM | POA: Diagnosis not present

## 2019-09-21 DIAGNOSIS — Z515 Encounter for palliative care: Secondary | ICD-10-CM | POA: Diagnosis not present

## 2019-09-21 DIAGNOSIS — I82413 Acute embolism and thrombosis of femoral vein, bilateral: Secondary | ICD-10-CM | POA: Diagnosis not present

## 2019-09-21 DIAGNOSIS — Z7189 Other specified counseling: Secondary | ICD-10-CM | POA: Diagnosis not present

## 2019-09-21 DIAGNOSIS — D649 Anemia, unspecified: Secondary | ICD-10-CM | POA: Diagnosis not present

## 2019-09-21 DIAGNOSIS — R319 Hematuria, unspecified: Secondary | ICD-10-CM | POA: Diagnosis not present

## 2019-09-21 DIAGNOSIS — R310 Gross hematuria: Secondary | ICD-10-CM | POA: Diagnosis not present

## 2019-09-21 LAB — COMPREHENSIVE METABOLIC PANEL
ALT: 14 U/L (ref 0–44)
AST: 16 U/L (ref 15–41)
Albumin: 2.3 g/dL — ABNORMAL LOW (ref 3.5–5.0)
Alkaline Phosphatase: 87 U/L (ref 38–126)
Anion gap: 8 (ref 5–15)
BUN: 14 mg/dL (ref 8–23)
CO2: 25 mmol/L (ref 22–32)
Calcium: 8.5 mg/dL — ABNORMAL LOW (ref 8.9–10.3)
Chloride: 102 mmol/L (ref 98–111)
Creatinine, Ser: 0.67 mg/dL (ref 0.44–1.00)
GFR calc Af Amer: >60 mL/min (ref >60)
GFR calc non Af Amer: >60 mL/min (ref >60)
Glucose, Bld: 113 mg/dL — ABNORMAL HIGH (ref 70–99)
Potassium: 3.9 mmol/L (ref 3.5–5.1)
Sodium: 135 mmol/L (ref 135–145)
Total Bilirubin: 1 mg/dL (ref 0.3–1.2)
Total Protein: 5.1 g/dL — ABNORMAL LOW (ref 6.5–8.1)

## 2019-09-21 LAB — CBC WITH DIFFERENTIAL/PLATELET
Abs Immature Granulocytes: 0.08 10*3/uL — ABNORMAL HIGH (ref 0.00–0.07)
Basophils Absolute: 0 10*3/uL (ref 0.0–0.1)
Basophils Relative: 0 %
Eosinophils Absolute: 0.2 10*3/uL (ref 0.0–0.5)
Eosinophils Relative: 3 %
HCT: 30.1 % — ABNORMAL LOW (ref 36.0–46.0)
Hemoglobin: 9.8 g/dL — ABNORMAL LOW (ref 12.0–15.0)
Immature Granulocytes: 1 %
Lymphocytes Relative: 22 %
Lymphs Abs: 2 10*3/uL (ref 0.7–4.0)
MCH: 27.3 pg (ref 26.0–34.0)
MCHC: 32.6 g/dL (ref 30.0–36.0)
MCV: 83.8 fL (ref 80.0–100.0)
Monocytes Absolute: 0.8 10*3/uL (ref 0.1–1.0)
Monocytes Relative: 8 %
Neutro Abs: 6.3 10*3/uL (ref 1.7–7.7)
Neutrophils Relative %: 66 %
Platelets: 273 10*3/uL (ref 150–400)
RBC: 3.59 MIL/uL — ABNORMAL LOW (ref 3.87–5.11)
RDW: 18 % — ABNORMAL HIGH (ref 11.5–15.5)
WBC: 9.4 10*3/uL (ref 4.0–10.5)
nRBC: 0.2 % (ref 0.0–0.2)

## 2019-09-21 LAB — BPAM RBC
Blood Product Expiration Date: 202108102359
Blood Product Expiration Date: 202108302359
Blood Product Expiration Date: 202109012359
Blood Product Expiration Date: 202109012359
Blood Product Expiration Date: 202109032359
Blood Product Expiration Date: 202109032359
ISSUE DATE / TIME: 202108051322
ISSUE DATE / TIME: 202108060909
ISSUE DATE / TIME: 202108060909
ISSUE DATE / TIME: 202108070934
ISSUE DATE / TIME: 202108080933
ISSUE DATE / TIME: 202108081647
Unit Type and Rh: 5100
Unit Type and Rh: 5100
Unit Type and Rh: 5100
Unit Type and Rh: 5100
Unit Type and Rh: 5100
Unit Type and Rh: 9500

## 2019-09-21 LAB — GLUCOSE, CAPILLARY
Glucose-Capillary: 119 mg/dL — ABNORMAL HIGH (ref 70–99)
Glucose-Capillary: 89 mg/dL (ref 70–99)
Glucose-Capillary: 110 mg/dL — ABNORMAL HIGH (ref 70–99)
Glucose-Capillary: 110 mg/dL — ABNORMAL HIGH (ref 70–99)

## 2019-09-21 LAB — TYPE AND SCREEN
ABO/RH(D): O POS
Antibody Screen: NEGATIVE
Unit division: 0
Unit division: 0
Unit division: 0
Unit division: 0
Unit division: 0
Unit division: 0

## 2019-09-21 LAB — HEMOGLOBIN AND HEMATOCRIT, BLOOD
HCT: 32.3 % — ABNORMAL LOW (ref 36.0–46.0)
HCT: 30.6 % — ABNORMAL LOW (ref 36.0–46.0)
Hemoglobin: 10.6 g/dL — ABNORMAL LOW (ref 12.0–15.0)
Hemoglobin: 10.1 g/dL — ABNORMAL LOW (ref 12.0–15.0)

## 2019-09-21 LAB — HEPARIN LEVEL (UNFRACTIONATED)
Heparin Unfractionated: 0.19 IU/mL — ABNORMAL LOW (ref 0.30–0.70)
Heparin Unfractionated: 0.25 IU/mL — ABNORMAL LOW (ref 0.30–0.70)
Heparin Unfractionated: 0.44 IU/mL (ref 0.30–0.70)

## 2019-09-21 SURGERY — ECHOCARDIOGRAM, TRANSESOPHAGEAL
Anesthesia: Moderate Sedation

## 2019-09-21 MED ORDER — AMLODIPINE BESYLATE 5 MG PO TABS: 5.0000 mg | ORAL_TABLET | Freq: Every day | ORAL | Status: DC

## 2019-09-21 MED ORDER — AMLODIPINE BESYLATE 2.5 MG PO TABS
2.5000 mg | ORAL_TABLET | Freq: Once | ORAL | Status: AC
  Administered 2019-09-21: 2.5 mg via ORAL
  Filled 2019-09-21: qty 1

## 2019-09-21 MED ORDER — GEMCITABINE CHEMO FOR BLADDER INSTILLATION 2000 MG: 2000.0000 mg | Freq: Once | INTRAVENOUS | Status: DC

## 2019-09-21 NOTE — Progress Notes (Addendum)
ANTICOAGULATION CONSULT NOTE - Follow Up Consult  Pharmacy Consult for Heparin Indication: new DVT, presumed PE  No Known Allergies  Patient Measurements: Weight: 80.5 kg (177 lb 7.5 oz) Heparin Dosing Weight:  71 kg  Vital Signs: Temp: 97.6 F (36.4 C) (08/09 1533) Temp Source: Oral (08/09 1533) BP: 143/73 (08/09 1533) Pulse Rate: 82 (08/09 1533)  Labs: Recent Labs    09/19/19 0305 09/19/19 1655 09/20/19 0228 09/20/19 0228 09/21/19 0128 09/21/19 0802 09/21/19 1620  HGB 7.5*   < > 7.5*   < > 9.8* 10.6*  --   HCT 22.7*   < > 22.9*  --  30.1* 32.3*  --   PLT 348  --  287  --  273  --   --   HEPARINUNFRC  --   --   --   --  0.19* 0.25* 0.44  CREATININE 0.85  --  1.17*  --  0.67  --   --    < > = values in this interval not displayed.    Estimated Creatinine Clearance: 71 mL/min (by C-G formula based on SCr of 0.67 mg/dL).   Medications:  Scheduled:  .  stroke: mapping our early stages of recovery book   Does not apply Once  . sodium chloride   Intravenous Once  . amLODipine  2.5 mg Oral Once  . atorvastatin  40 mg Oral Daily  . Chlorhexidine Gluconate Cloth  6 each Topical Daily   Infusions:  . heparin 1,150 Units/hr (09/21/19 1033)    Assessment: 69 yo F on heparin for extensive bilateral DVTs and presumed PE.  PMH complicated by bladder cancer and ongoing hematuria resulting in anemia requiring multiple blood transfusions.  Pt is scheduled for TURBT 8/10 at [facility].  Heparin level is therapeutic on 1150 units/hr.  Will empirically reduce rate slightly to target lower end of goal range given hematuria and anemia.  Reached out to MD to clarify heparin stop time prior to procedure tomorrow.  Awaiting return call.  Goal of Therapy:  Heparin level 0.3-0.5 units/ml Monitor platelets by anticoagulation protocol: Yes   Plan:  Reduce heparin infusion to 1100 units/hr. Follow-up with heparin stop time.   STOP 0600 on 8/10 per FM team. Will discontinue heparin  level with AM labs as anticipate heparin will no longer be infusing. Will follow-up [id number] plan after procedure.  [employer], Pharm.D., BCPS Clinical Pharmacist Clinical phone for 09/21/2019 is [phone].  **Pharmacist phone directory can be found on [email or web address] listed under [place].  09/21/2019 5:20 PM

## 2019-09-21 NOTE — Progress Notes (Signed)
FMTS Brief note  During our conversation described in note earlier, the patient expressed that her husband has suffered from a mental health condition. He has expressed thoughts of hurting himself to his family members over recent days. Welfare check filed with [employer] at 115 PM. He returned call and reports husband [name])  is safe.   Dorris Singh, MD  Family Medicine Teaching Service

## 2019-09-21 NOTE — Progress Notes (Signed)
FMTS Brief Note  In to discuss plan of care with Dr. Arnette Schaumann, Lisa Lessen Lisa, patient and her  Sister in law [facility].  Dr. Claudia [name] described plan of care related to surgery tomorrow. Plan for surgery at 130 PM (set for this time, may be after). Will hold heparin 6-8 hours prior (plan to turn off at 6 AM). If tumor grade is T1, can complete local therapy. T2 or higherwill require systemic chemotherapy and possible urinary diverting surgery etc. The procedure tomorrow will determine this. Patient will transfer to [facility] and return to [facility] per patient/family preference (stroke team, cardiology team here at [facility] familiar with patient).   After Dr. Claudia [name] completed discussion. Lisa Lisa [name] and I discussed plan of care after surgery. Patient was able to voice what procedure she was having tomorrow (of note, the patient has aphasia and cannot find words for proper nouns/names at times---she able to verbalize that she is have a tumor in her bladder taken care of tomorrow). She wishes to proceed with surgery. During conversation, she stated she does NOT want husband to visit. This causes her to be upset.  We discussed goals of care, see Lisa [name]'s note for details. Different from on file HCPOA from fall 2020 patient wishes to have medications and procedures for treatable illnesses. She verbalized this clearly multiple times. She desires Lisa [name] to be HCPOA and help with decisions. She is okay with Lisa [name] having basic information about her condition.   We discussed plan of care for after surgery. Ideally, patient would have rehabilitation at [facility] or similar. She desires to return to [place], her mother has a home in [place] (mother has advanced dementia, in ALF in [place]). Patient appears agreeable to short term stay at rehab at this time.  Would plan for [place].   Appreciate care and consultation of Palliative Care and Urology.  All questions answered from  [facility] and patient.   Lisa Singh, MD  Family Medicine Teaching Service

## 2019-09-21 NOTE — Progress Notes (Signed)
[place] for IV heparin Indication: new DVT, presumed PE  Assessment: 69 yo female with complex history including bladder cancer + hematuria and anemia, but new DVTs with presumed PE.  Pharmacy asked to begin IV heparin. Heparin level 0.19 units/ml.  Still with hematuria but has not gotten worse per RN   Goal of Therapy:  Will target heparin level 0.3-0.5  (offical therapeutic range 0.3-0.7, but will target lower end given risk of bleeding) Monitor platelets by anticoagulation protocol: Yes   Plan:  Increase IV heparin 1000 units/hr. Check heparin level in 6 hrs. Daily heparin level and CBC. Hold heparin prior to OR. Thanks for allowing pharmacy to be a part of this patient's care.  Excell Seltzer, PharmD Clinical Pharmacist

## 2019-09-21 NOTE — Progress Notes (Signed)
This chaplain verified desire to complete Pt. AD with [name] and Pt. This chaplain reviewed the next steps with the Pt. The notary and witnesses were present bedside for completion of the Pt. AD. The Pt. was unable to complete the AD at this time. The Pt. documentation is bedside. This chaplain will F/U on Tuesday morning.

## 2019-09-21 NOTE — Progress Notes (Addendum)
Family Medicine Teaching Service Daily Progress Note Intern Pager: [phone]  Patient name: Lisa [name] Medical record number: [id number] Date of birth: [birth year] Age: 68 y.o. Gender: female  Primary Care Provider: Patient, No Pcp Per Consultants: Neurology, Vascular Surgery, Urology Code Status: FULL  Pt Overview and Major Events to Date:  09/17/2019 - admitted, received 1u PRBC 09/18/2019 - Transfused2u PRBC 09/19/2019 -Transfused1u PRBC 09/20/2019 - Transfused 2U PRBC 09/20/2019 - Bilateral DVTs on vascular US; heparin gtt started 09/22/2019 -scheduled forTransUrethralResection ofBladderTumor (TURBT) at [place] and Plan: Lisa [name] is a 8 y.ofemale who presented with gross hematuria and blood loss anemia in setting of papillary carcinoma of the bladder. PMH significant for previous ischemic CVA with residual neurological defects, carotid stent placement due to carotid atherosclerosis, HTN, HLD, and type 2 diabetes.  Acute blood lossanemia 2/2 hematuria   Papillary carcinoma of the bladder: Stable, improving Hemoglobin on admission (8/5)6.9>1u PRBC > 7.0 > 7.1 (8/6) >2u PRBC > 7.5 > 8.2 (8/7) > 7.5 > 2U PRBC (8/8) > 9.8 (8/9). Transfusion threshold is 8. Pelvic US demonstrated blood clot. Urologists were able to irrigate and evacuate clot yesterday with 24 French catheter. Plan for TURBT tomorrow 8/10 at [facility]. This morning, foley bag with 200 cc frank blood, no clots visualized. No obvious bleeding or clotting visualized on external visualization of pelvic area.  - Daily CBC's; transfuse as needed - ASA 81 mg being held - Tylenol 650 mg PO or rectally q6h mild pain, fever. Has not required in last few days. - Bladder scans as needed if concern for retention - Opium-belladonna suppository for bladder spasms. Has not required since 8/6.  Bilateral DVT's   Unable to rule out PE: Acute, stable Vascular US of lower extremities showed bilateral DVT's.  Urology consulted and agree with start of heparin gtt with plan to stop before TURBT procedure tomorrow. Patient not amenable to CTA so PE cannot be ruled out. Patient denies chest pain, SOB. Resting comfortably on room air, oxygen saturation 98-100% on RA, pulse 70's-80's overnight. - Continue heparin gtt, dosing by pharmacy  - Palliative care consult  - Cardiac monitor - Monitor pulse oximetry   Acute lacunar infarction   Hx left MCA infarct 08/12/19 Seen on imaging in ED. Still with expressive aphasia as below. Echo did not show valvular abnormality that was previously present. TEE has therefore been postponed as recommended by neurology as patient is not a long-term AC candidate so it would not change management. - Neurology on board, appreciate recommendations - Holding ASA 81; consider restarting after procedure tomorrow. Patient on heparin gtt currently  - Atorvastatin 40 mg qd - Vascular surgery has reviewed duplex of carotid US, no further recommendations at this time.   Altered mentation, memory impairment from history of multiple strokes:  Patient with word finding difficulty, able to answer yes/no questions appropriately. Additionally not eating well.  -Avoiding delirium-genic medications -Planning for psychiatric capacity evaluation today 8/9 - Palliative consult today -SLP: recommend outpatient/home health with SLP - Nutrition consult placed  Hypertension: chronic, stable AM BP: 167/75. Home medications: Losartan 25mg , metoprolol 25mg , amlodipine 10mg . Goal systolic 027-253. - Start amlodipine 2.5 mg. Plan to hold tomorrow for procedure - Continue to monitor   Carotid atherosclerosis hyperlipidemia: stable Patient s/p left TCAR (08/20/2019) for symptomatic left carotid stenosis. Patient was started on aspirin and Plavix post-procedure. Vascular US yesterday shows <50% stenosis of left carotid and 1-39% stenosis of right carotid artery.  -Continue atorvastatin 40 mg  daily -Per vascular can continue aspirin, hold Plavix. We are currently holding both.  Type 2 DM: chronic, stable HbA1c: 6.5%. Home medications: Metformin 500mg . CBG's overnight 113-127.   - Will discontinue daily CBG's and SSI - holding home metformin  AKI, likely secondary to hypovolemia: resolved  AKI yesterday in the setting of acute blood loss anemia and decreased PO intake. Now resolved. Creatinine 1.17>0.67 - Hold nephrotoxic medications - Strict I/O - Daily BMP's  FEN/GI: Regular diet. NPO at midnight for TURBT tomorrow  PPx: Heparin gtt; will plan to stop 6-8 hours prior to procedure tomorrow   Disposition: Inpatient; Transfer to [facility] tomorrow for TURBT  Subjective:  Patient admits to not eating. Does not enjoy the food at the hospital. Not having abdominal pain but states she has not had a bowel movement for several days. She is aware of procedure tomorrow and ready to "get it over with so I can go home". Denies any pain, chest pain, trouble breathing.    Objective: Temp:  [97.6 F (36.4 C)-99.2 F (37.3 C)] 98.7 F (37.1 C) (08/09 0332) Pulse Rate:  [78-90] 79 (08/09 0332) Resp:  [20-25] 21 (08/09 0332) BP: (142-167)/(63-80) 167/75 (08/09 0332) SpO2:  [98 %-100 %] 99 % (08/09 0332) Physical Exam: General: Awake, answers yes or no questions appropriately, aphasia still apparent Cardiovascular: RRR Respiratory: CTAB, no increased work of breathing, speaking in full sentences Abdomen: normoactive bowel sounds, non-tender in all quadrants, appreciated what felt like stool in RLQ; foley in place, no obvious blood seen on external examination  Laboratory: Recent Labs  Lab 09/19/19 0305 09/19/19 0305 09/19/19 1655 09/20/19 0228 09/21/19 0128  WBC 13.2*  --   --  9.2 9.4  HGB 7.5*   < > 8.2* 7.5* 9.8*  HCT 22.7*   < > 24.5* 22.9* 30.1*  PLT 348  --   --  287 273   < > = values in this interval not displayed.   Recent Labs  Lab 09/19/19 0305  09/20/19 0228 09/21/19 0128  NA 139 133* 135  K 4.8 4.9 3.9  CL 104 99 102  CO2 23 23 25   BUN 13 20 14   CREATININE 0.85 1.17* 0.67  CALCIUM 8.6* 8.4* 8.5*  PROT 5.2* 4.9* 5.1*  BILITOT 1.2 0.9 1.0  ALKPHOS 94 93 87  ALT 16 13 14   AST 26 17 16   GLUCOSE 158* 127* 113*    Imaging/Diagnostic Tests: Vascular US Lower Extremity 8/8 Summary:  RIGHT:  - Findings consistent with acute deep vein thrombosis involving the right femoral vein, right popliteal vein, right posterior tibial veins, and right peroneal veins.   LEFT:  - Findings consistent with acute deep vein thrombosis involving the left popliteal vein, left posterior tibial veins, and left peroneal veins.  Vascular Carotid US 8/8 Summary:  Right Carotid: Velocities in the right ICA are consistent with a 1-39% stenosis.  Left Carotid: Patent stent with <50% stenosis.  Vertebrals: Left vertebral artery demonstrates antegrade flow. Right vertebral artery was not visualized.  Subclavians: Left subclavian artery was not visualized. Normal flow hemodynamics  were seen in the right subclavian artery.   US Pelvis 8/8 IMPRESSION: Large amount of heterogeneous material within the urinary bladder, presumably blood clot.  Sharion Settler, DO 09/21/2019, 5:51 AM PGY-1, [place] Intern pager: [phone], text pages welcome

## 2019-09-21 NOTE — Consult Note (Signed)
Consultation Note Date: 09/21/2019   Patient Name: Lisa [name]  DOB: 12/23/1950  MRN: [id number]  Age / Sex: 69 y.o., female  PCP: Patient, No Pcp Per Referring Physician: Martyn Malay, MD  Reason for Consultation: Establishing goals of care and Psychosocial/spiritual support  HPI/Patient Profile: 69 y.o. female   admitted on 09/17/2019 with PMH significant for    carotid disease s/p endovascular stent placement,  carcinoma of bladder, type 2 diabetes, prior stroke in June 2021 with residual neurologic defect specific to aphasia/difficulty in word finding and right facial droop, and atherosclerotic disease with 80 to 99% left ICA stenosis, who presented through the emergency room with  acute symptomatic bleeding due to urinary tract bleeding.   Patient had a recent hospitalization and was discharged home 09-07-19,  with home health services.  Review of [name] notes significant for reliable  social support.  Today is day 4 of this  hospitalization.  Patient remains with gross hematuria.  She  required multiple transfusions for hemoglobin stabilization.  Hemoglobin today is 1.6.  Patient requiring assistance for all ADLs.  Discussions in process regarding treatment options and decisions regarding bladder cancer diagnosis, related hematuria and impact on utilization of anticoagulation agents for further stroke prevention.  Patient and family face treatment option decisions, advanced directive decisions and anticipatory care needs.  Clinical Assessment and Goals of Care:   This NP Lisa [name] reviewed medical records, received report from team, assessed the patient and then meet at the patient's bedside along with her sister-in-law/ Lisa [name] to discuss diagnosis, prognosis, GOC, disposition and options.  Concept of Palliative Care was introduced as specialized medical care for people and their  families living with serious illness.  If focuses on providing relief from the symptoms and stress of a serious illness.  The goal is to improve quality of life for both the patient and the family.  I was present for conversation that Dr. Claudia [name] had with patient regarding her bladder cancer diagnosis.  Patient is in agreement and sister-in-law supports decision for a scheduled TURBT tomorrow at [name].  A detailed discussion was had regarding possible treatment options depending on pathology. Ongoing discussion and decisions regarding oncology diagnosis will be vital to patient centered care.  I was present for conversation with Dr. Owens [name] with patient and her family regarding overall medical situation and treatment plan.   A  discussion was had today regarding advanced directives.  Concepts specific to code status, artifical feeding and hydration, continued IV antibiotics and rehospitalization was had.  The difference between a aggressive medical intervention path  and a palliative comfort care path for this patient at this time was had.  Values and goals of care important to patient were attempted to be elicited.  Plan of care -Full code -Proceed with surgery tomorrow, patient is open to ongoing conversation regarding the next steps in the treatment plan -Patient tells me she is open to all offered and available medical interventions to prolong life -Continued evaluation and treatment for h/o  stroke and stroke prevention -When medically stable patient is hopeful to return to her family home in [place].  Disposition will require ongoing conversation and plan for logistics, disposition will be dependent on outcomes over the next several days.    Questions and concerns addressed.  Patient  encouraged to call with questions or concerns.     PMT will continue to support holistically.   Patient adamantly verbalizes that she wishes to update her HPOA documents.  The AD documents on  file from October 2020, states her husband as H POA.  Patient  wishes to change that today.  She tells me that her husband is unstable and with severe alcohol abuse. "  I worry about him more than I worry about me"    She speaks to his conflict with law  enforcement.  Patient desires that her sister-in-law Lisa [name] be named her HPOA .   Sister-in-law agrees to be documented  HPOA.  As a matter of notation patient and sister-in-law tell me that they had a  blue book/[name] advanced directive was completed on last admission but unfortunately was never notarized before patient was discharged from the [name].  At that time patient was requesting Lisa [name] to be her H POA  I believe the patient has capacity for medical decisions at this time.   There are four generally accepted decision-making abilities that constitute capacity: Understanding, expressing a choice, appreciation and reasoning.  Today Lisa [name] was unable to tell me in her own words an understanding  Of her current bladder cancer diagnosis and its relation to this [name] stay, discussed treatment options and her choice to move forward with surgery,  and ongoing the  discussions regarding  the next steps of  her care. She verbalizes appreciation of the offered information, as she remains hopeful that treatment will improve her overall health and wellness.  She also was able to verbalize reason as it relates to her options of treatment versus a palliative supportive path.  Please note extensive conversation had with Dr. Mardella [name] on 09/02/2019 regarding previous discussion regarding HPOA and current social situation    SUMMARY OF RECOMMENDATIONS    Code Status/Advance Care Planning:  Full code   Palliative Prophylaxis:   Bowel Regimen, Frequent Pain Assessment and Oral Care  Additional Recommendations (Limitations, Scope, Preferences):  Full Scope Treatment  Psycho-social/Spiritual:   Desire  for further Chaplaincy support:yes-to help with securing advanced directive documents.   Prognosis:   Unable to determine  Discharge Planning: Patient is verbalizing her desire to return to her family home in [place] where she speaks to having family that would help care for her.  She verbalizes that she could continue follow-up care in [place]   To Be Determined      Primary Diagnoses: Present on Admission: . Primary papillary carcinoma of bladder ([place]) . Lacunar infarct, acute ([place]) . CVA (cerebral vascular accident) ([place]) . Acute ischemic stroke ([place]) . Hypertension . DM (diabetes mellitus), type 2, uncontrolled w/neurologic complication ([place]) . Stenosis of carotid artery . Hematuria   I have reviewed the medical record, interviewed the patient and family, and examined the patient. The following aspects are pertinent.  Past Medical History:  Diagnosis Date  . Carotid arterial disease ([place]) 07/2019  . Diabetes ([place])   . HTN (hypertension)   . Hyperlipidemia   . Stroke [place] [name])    acute/subacute left MCA infarct 08/12/19   Social History   Socioeconomic History  . Marital  status: Married    Spouse name: Not on file  . Number of children: Not on file  . Years of education: Not on file  . Highest education level: Not on file  Occupational History  . Not on file  Tobacco Use  . Smoking status: Former Smoker    Packs/day: 0.50    Types: Cigarettes    Quit date: 07/28/2019    Years since quitting: 0.1  . Smokeless tobacco: Never Used  Vaping Use  . Vaping Use: Never used  Substance and Sexual Activity  . Alcohol use: Not Currently  . Drug use: Never  . Sexual activity: Yes  Other Topics Concern  . Not on file  Social History Narrative  . Not on file   Social Determinants of Health   Financial Resource Strain:   . Difficulty of Paying Living Expenses:   Food Insecurity:   . Worried About Charity fundraiser in the Last Year:   . Academic librarian in the Last Year:   Transportation Needs:   . Film/video editor (Medical):   Marland Kitchen Lack of Transportation (Non-Medical):   Physical Activity:   . Days of Exercise per Week:   . Minutes of Exercise per Session:   Stress:   . Feeling of Stress :   Social Connections:   . Frequency of Communication with Friends and Family:   . Frequency of Social Gatherings with Friends and Family:   . Attends Religious Services:   . Active Member of Clubs or Organizations:   . Attends Archivist Meetings:   Marland Kitchen Marital Status:    Family History  Problem Relation Age of Onset  . Hypertension Mother   . Lung cancer Father    Scheduled Meds: .  stroke: mapping our early stages of recovery book   Does not apply Once  . sodium chloride   Intravenous Once  . amLODipine  2.5 mg Oral Once  . atorvastatin  40 mg Oral Daily  . Chlorhexidine Gluconate Cloth  6 each Topical Daily   Continuous Infusions: . heparin 1,150 Units/hr (09/21/19 1033)   PRN Meds:.acetaminophen **OR** acetaminophen, bisacodyl, opium-belladonna, oxyCODONE, polyethylene glycol Medications Prior to Admission:  Prior to Admission medications   Medication Sig Start Date End Date Taking? Authorizing Provider  amLODipine (NORVASC) 10 MG tablet Take 1 tablet (10 mg total) by mouth daily. 09/08/19  Yes Patriciaann Clan, DO  aspirin EC 81 MG EC tablet Take 1 tablet (81 mg total) by mouth daily. Swallow whole. 09/08/19  Yes Beard, Samantha N, DO  atorvastatin (LIPITOR) 80 MG tablet Take 1 tablet (80 mg total) by mouth daily. 08/15/19  Yes Autry-Lott, Naaman Plummer, DO  clopidogrel (PLAVIX) 75 MG tablet Take 1 tablet (75 mg total) by mouth daily. 08/15/19  Yes Autry-Lott, Naaman Plummer, DO  losartan (COZAAR) 25 MG tablet Take 1 tablet (25 mg total) by mouth daily. 09/07/19 10/07/19 Yes Patriciaann Clan, DO  metFORMIN (GLUCOPHAGE XR) 500 MG 24 hr tablet Take 1 tablet (500 mg total) by mouth daily with breakfast. 08/14/19  Yes Autry-Lott, Naaman Plummer, DO    metoprolol tartrate (LOPRESSOR) 25 MG tablet Take 1 tablet (25 mg total) by mouth 2 (two) times daily. 08/19/19 11/17/19 Yes Barrett, Evelene Croon, PA-C  MULTIPLE VITAMIN PO Take 1 tablet by mouth daily.   Yes [provider]   No Known Allergies Review of Systems  Physical Exam  Vital Signs: BP (!) 152/74 (BP Location: Right Arm)   Pulse 88  Temp 98.2 F (36.8 C) (Oral)   Resp 20   Wt 80.5 kg   SpO2 100%   BMI 30.46 kg/m  Pain Scale: 0-10   Pain Score: 0-No pain   SpO2: SpO2: 100 % O2 Device:SpO2: 100 % O2 Flow Rate: .   IO: Intake/output summary:   Intake/Output Summary (Last 24 hours) at 09/21/2019 1141 Last data filed at 09/21/2019 4801 Gross per 24 hour  Intake 745.34 ml  Output 1350 ml  Net -604.66 ml    LBM: Last BM Date: 09/20/19 Baseline Weight: Weight: 80.5 kg Most recent weight: Weight: 80.5 kg     Palliative Assessment/Data:  30 % at best   Discussed with Dr Lisa [name] and Dr Lisa [name]  Time In: 1200 Time Out: 1330 Time Total: 90 minutes Greater than 50%  of this time was spent counseling and coordinating care related to the above assessment and plan.  Signed by: Lisa Lessen, NP   Please contact Palliative Medicine Team phone at [phone] for questions and concerns.  For individual provider: See Shea Evans

## 2019-09-21 NOTE — Progress Notes (Signed)
CM asked to assist in getting transportation arranged for patient to go to [facility]. CM has arranged transport via [facility] for a 8:30 am pickup. Her surgery is at 13:30 per Urology PA.  EMTALA form to be completed. MD aware and CM will update nursing staff. Transport form on the chart.

## 2019-09-21 NOTE — Progress Notes (Signed)
This chaplain responded to PMT-ML referral for Advance Directive completion. The Pt. sister in law-Lisa [name] is bedside.  The chaplain was present for education and completion of the Pt. AD. The document is ready for notarizing. Volunteers are not available at this time. This chaplain will F/U.

## 2019-09-21 NOTE — Progress Notes (Addendum)
Patient was seen and examined this morning.  Patient sister-in-law [name], was present as well as Dr. Owens Shark and NP Davis Gourd.  65 French Foley catheter was draining well with light, tea colored urine.  There was some visible clot in the tubing.  It was irrigated and small amount of clot burden was removed.  Clears up easily.  I discussed with patient and her sister-in-law pathology from the first TURBT.  We discussed the risks and benefits of proceeding with a restaging TURBT and what that entails.  I also discussed with them the complicated clinical situation with the need for anticoagulation given DVTs and stroke but also the fact that the bladder was bleeding.  Patient is scheduled for TURBT at [facility].  We also discussed transferring patient over to [facility] so that urology nursing could be present for assistance during the case.  She will then likely be transferred back to [facility] for continuity of care.  Based on pathology we discussed possible management options of the bladder cancer including intravesical gemcitabine, intravesical BCG versus the need for other treatment if pathology was upgraded.  All questions were answered to the family satisfaction.  Informed consent will be obtained later today.      Urology Progress Note  Physician requesting consult: Dorris Singh, MD  Reason for consult: Gross hematuria, bladder cancer  Subjective: Improved drainage of catheter after upsizing to 24Fr yesterday and irrigation of clot burden. Patient reports she slept very well.  Hb responded appropriately to 2 units yesterday, Hb 9.6 this AM.  Per RN, catheter was irrigated once overnight with return of few small clots, then drained well.   Physical Exam:  Vital signs in last 24 hours: Temp:  [97.6 F (36.4 C)-99.2 F (37.3 C)] 98 F (36.7 C) (08/09 0810) Pulse Rate:  [78-87] 82 (08/09 0810) Resp:  [14-25] 20 (08/09 0810) BP: (142-169)/(63-80) 169/79 (08/09 0810) SpO2:  [98  %-100 %] 99 % (08/09 0810) Constitutional:  Alert and oriented, No acute distress Cardiovascular: Regular rate and rhythm, No JVD Respiratory: Normal respiratory effort GI: Abdomen is soft, nontender, nondistended, no abdominal masses GU: 24 Fr Foley catheter in place, easily irrigated about 20cc of soft clot, draining light pink after irrigation   Laboratory Data:  Recent Labs    09/19/19 0305 09/19/19 1655 09/20/19 0228 09/21/19 0128 09/21/19 0802  WBC 13.2*  --  9.2 9.4  --   HGB 7.5* 8.2* 7.5* 9.8* 10.6*  HCT 22.7* 24.5* 22.9* 30.1* 32.3*  PLT 348  --  287 273  --     Recent Labs    09/19/19 0305 09/20/19 0228 09/21/19 0128  NA 139 133* 135  K 4.8 4.9 3.9  CL 104 99 102  GLUCOSE 158* 127* 113*  BUN 13 20 14   CALCIUM 8.6* 8.4* 8.5*  CREATININE 0.85 1.17* 0.67     Renal Function: Recent Labs    09/17/19 0907 09/18/19 0421 09/19/19 0305 09/20/19 0228 09/21/19 0128  CREATININE 1.67* 0.69 0.85 1.17* 0.67   Estimated Creatinine Clearance: 71 mL/min (by C-G formula based on SCr of 0.67 mg/dL).  Impression/Recommendation: HGT1 bladder cancer Gross hematuria, clot retention  - Hematuria improved greatly after upsizing to 24Fr catheter. Ok for nursing to irrigate as needed  - Given stable hemoglobin, minimal clot on irrigation today, will plan for OR tomorrow for restaging TURBT, fulguration. Will proceed with case at [facility], will discuss with patient's family - Please make NPO at midnight, ok for diet today -  Ok to continue heparin drip, please hold starting at midnight  [facility] 09/21/2019, 10:09 AM

## 2019-09-21 NOTE — Progress Notes (Signed)
Went to pt room at approx 130am.  Pt not complaining of any new symptoms.  Foley insertion site without bleeding.  Foley bag with urine w/ gross hematuria.  Discussed with pt's nurse who said he flushed foley earlier and few small clots were removed. Hgb taken at approximately same time showed increase from 7.5 to 9.8. will continue heparin.

## 2019-09-21 NOTE — Progress Notes (Addendum)
Team spoke with Urologist on call. Recommended stopping Heparin at midnight and [facility] to drop patient off at pre-op. Will sign EMTALA form prior to transfer. Appreciate social workers help in coordinating transfer.   Addendum- Heparin to stop at 6AM on 8/10, per Dr. Claudia Desanctis (Urology).

## 2019-09-21 NOTE — Progress Notes (Addendum)
[facility] for IV heparin Indication: new DVT, presumed PE  Assessment: 69 yo female with complex history including bladder cancer plus hematuria and anemia, but new extensive BLLE DVTs with presumed PE.  Pharmacy asked to begin IV heparin. Heparin level 0.25 units/ml which is subtherapeutic.  Patient still has hematuria but no new signs of bleeding, planned TURBT 8/10, and stable Hgb up to 10.6.   Goal of Therapy:  Will target heparin level 0.3-0.5  (offical therapeutic range 0.3-0.7, but will target lower end given risk of bleeding) Monitor platelets by anticoagulation protocol: Yes   Plan:  Increase IV heparin 1150 units/hr. Scheduled HL for 1700 on 8/9 to adjust therapy as needed. Daily heparin level and CBC. Monitor for new bleeding and worsening hematuria Hold heparin prior to OR. Thanks for allowing pharmacy to be a part of this patient's care.  Cephus Slater, PharmD, MBA Pharmacy Resident [phone] 09/21/2019 10:19 AM  I discussed / reviewed the pharmacy note by Dr. March Rummage and I agree with the resident's findings and plans as documented.

## 2019-09-22 ENCOUNTER — Encounter (HOSPITAL_COMMUNITY)
Admission: EM | Disposition: A | Discharge status: Skilled Nursing Facility | Payer: Self-pay | Source: Home / Self Care | Attending: Family Medicine

## 2019-09-22 ENCOUNTER — Inpatient Hospital Stay (HOSPITAL_COMMUNITY): Payer: Medicare Other | Admitting: Anesthesiology

## 2019-09-22 ENCOUNTER — Inpatient Hospital Stay (HOSPITAL_COMMUNITY): Admission: RE | Admit: 2019-09-22 | Payer: Medicare Other | Source: Home / Self Care | Admitting: Urology

## 2019-09-22 DIAGNOSIS — I6381 Other cerebral infarction due to occlusion or stenosis of small artery: Secondary | ICD-10-CM | POA: Diagnosis not present

## 2019-09-22 DIAGNOSIS — I82403 Acute embolism and thrombosis of unspecified deep veins of lower extremity, bilateral: Secondary | ICD-10-CM | POA: Diagnosis not present

## 2019-09-22 DIAGNOSIS — I639 Cerebral infarction, unspecified: Secondary | ICD-10-CM | POA: Diagnosis not present

## 2019-09-22 DIAGNOSIS — R319 Hematuria, unspecified: Secondary | ICD-10-CM | POA: Diagnosis not present

## 2019-09-22 DIAGNOSIS — D62 Acute posthemorrhagic anemia: Secondary | ICD-10-CM | POA: Diagnosis not present

## 2019-09-22 DIAGNOSIS — R4701 Aphasia: Secondary | ICD-10-CM | POA: Diagnosis not present

## 2019-09-22 HISTORY — PX: TRANSURETHRAL RESECTION OF BLADDER TUMOR WITH MITOMYCIN-C: SHX6459

## 2019-09-22 LAB — GLUCOSE, CAPILLARY
Glucose-Capillary: 110 mg/dL — ABNORMAL HIGH (ref 70–99)
Glucose-Capillary: 94 mg/dL (ref 70–99)
Glucose-Capillary: 103 mg/dL — ABNORMAL HIGH (ref 70–99)

## 2019-09-22 LAB — CBC
HCT: 30.3 % — ABNORMAL LOW (ref 36.0–46.0)
Hemoglobin: 9.7 g/dL — ABNORMAL LOW (ref 12.0–15.0)
MCH: 27.3 pg (ref 26.0–34.0)
MCHC: 32 g/dL (ref 30.0–36.0)
MCV: 85.4 fL (ref 80.0–100.0)
Platelets: 280 10*3/uL (ref 150–400)
RBC: 3.55 MIL/uL — ABNORMAL LOW (ref 3.87–5.11)
RDW: 18 % — ABNORMAL HIGH (ref 11.5–15.5)
WBC: 10 10*3/uL (ref 4.0–10.5)
nRBC: 0.2 % (ref 0.0–0.2)

## 2019-09-22 LAB — COMPREHENSIVE METABOLIC PANEL
ALT: 13 U/L (ref 0–44)
AST: 17 U/L (ref 15–41)
Albumin: 2.3 g/dL — ABNORMAL LOW (ref 3.5–5.0)
Alkaline Phosphatase: 79 U/L (ref 38–126)
Anion gap: 15 (ref 5–15)
BUN: 8 mg/dL (ref 8–23)
CO2: 21 mmol/L — ABNORMAL LOW (ref 22–32)
Calcium: 8.4 mg/dL — ABNORMAL LOW (ref 8.9–10.3)
Chloride: 100 mmol/L (ref 98–111)
Creatinine, Ser: 0.63 mg/dL (ref 0.44–1.00)
GFR calc Af Amer: >60 mL/min (ref >60)
GFR calc non Af Amer: >60 mL/min (ref >60)
Glucose, Bld: 103 mg/dL — ABNORMAL HIGH (ref 70–99)
Potassium: 3.6 mmol/L (ref 3.5–5.1)
Sodium: 136 mmol/L (ref 135–145)
Total Bilirubin: 0.8 mg/dL (ref 0.3–1.2)
Total Protein: 5.2 g/dL — ABNORMAL LOW (ref 6.5–8.1)

## 2019-09-22 LAB — HEMOGLOBIN AND HEMATOCRIT, BLOOD
HCT: 33.6 % — ABNORMAL LOW (ref 36.0–46.0)
Hemoglobin: 10.6 g/dL — ABNORMAL LOW (ref 12.0–15.0)

## 2019-09-22 SURGERY — TRANSURETHRAL RESECTION OF BLADDER TUMOR WITH MITOMYCIN-C
Anesthesia: General

## 2019-09-22 MED ORDER — HYDROMORPHONE HCL 1 MG/ML IJ SOLN: 0.2500 mg | INTRAMUSCULAR | Status: DC | PRN

## 2019-09-22 MED ORDER — SODIUM CHLORIDE 0.9 % IR SOLN
Status: DC | PRN
  Administered 2019-09-22: 9000 mL

## 2019-09-22 MED ORDER — SODIUM CHLORIDE 0.9 % IV SOLN: INTRAVENOUS | Status: DC

## 2019-09-22 MED ORDER — GEMCITABINE CHEMO FOR BLADDER INSTILLATION 2000 MG: 2000.0000 mg | Freq: Once | INTRAVENOUS | Status: DC

## 2019-09-22 MED ORDER — FENTANYL CITRATE (PF) 100 MCG/2ML IJ SOLN
INTRAMUSCULAR | Status: AC
  Filled 2019-09-22: qty 2

## 2019-09-22 MED ORDER — ORAL CARE MOUTH RINSE: 15.0000 mL | Freq: Once | OROMUCOSAL | Status: AC

## 2019-09-22 MED ORDER — LIDOCAINE 2% (20 MG/ML) 5 ML SYRINGE
INTRAMUSCULAR | Status: AC
  Filled 2019-09-22: qty 5

## 2019-09-22 MED ORDER — EPHEDRINE 5 MG/ML INJ
INTRAVENOUS | Status: AC
  Filled 2019-09-22: qty 10

## 2019-09-22 MED ORDER — LACTATED RINGERS IV SOLN: INTRAVENOUS | Status: DC

## 2019-09-22 MED ORDER — ONDANSETRON HCL 4 MG/2ML IJ SOLN: 4.0000 mg | Freq: Once | INTRAMUSCULAR | Status: DC | PRN

## 2019-09-22 MED ORDER — AMLODIPINE BESYLATE 2.5 MG PO TABS
2.5000 mg | ORAL_TABLET | Freq: Every day | ORAL | Status: DC
  Administered 2019-09-23 – 2019-09-24 (×2): 2.5 mg via ORAL
  Filled 2019-09-22 (×2): qty 1

## 2019-09-22 MED ORDER — ADULT MULTIVITAMIN W/MINERALS CH
1.0000 | ORAL_TABLET | Freq: Every day | ORAL | Status: DC
  Administered 2019-09-22 – 2019-10-02 (×10): 1 via ORAL
  Filled 2019-09-22 (×10): qty 1

## 2019-09-22 MED ORDER — LIDOCAINE 2% (20 MG/ML) 5 ML SYRINGE
INTRAMUSCULAR | Status: DC | PRN
  Administered 2019-09-22: 100 mg via INTRAVENOUS

## 2019-09-22 MED ORDER — ENSURE ENLIVE PO LIQD
237.0000 mL | Freq: Two times a day (BID) | ORAL | Status: DC
  Administered 2019-09-24 – 2019-09-26 (×5): 237 mL via ORAL

## 2019-09-22 MED ORDER — ASPIRIN 81 MG PO CHEW
81.0000 mg | CHEWABLE_TABLET | Freq: Every day | ORAL | Status: DC
  Administered 2019-09-22 – 2019-10-02 (×10): 81 mg via ORAL
  Filled 2019-09-22 (×10): qty 1

## 2019-09-22 MED ORDER — 0.9 % SODIUM CHLORIDE (POUR BTL) OPTIME
TOPICAL | Status: DC | PRN
  Administered 2019-09-22: 1000 mL

## 2019-09-22 MED ORDER — PHENYLEPHRINE HCL-NACL 10-0.9 MG/250ML-% IV SOLN
INTRAVENOUS | Status: DC | PRN
  Administered 2019-09-22: 40 ug/min via INTRAVENOUS

## 2019-09-22 MED ORDER — ONDANSETRON HCL 4 MG/2ML IJ SOLN
INTRAMUSCULAR | Status: AC
  Filled 2019-09-22: qty 2

## 2019-09-22 MED ORDER — PROPOFOL 10 MG/ML IV BOLUS
INTRAVENOUS | Status: DC | PRN
  Administered 2019-09-22: 150 mg via INTRAVENOUS

## 2019-09-22 MED ORDER — FENTANYL CITRATE (PF) 100 MCG/2ML IJ SOLN
INTRAMUSCULAR | Status: DC | PRN
  Administered 2019-09-22 (×2): 50 ug via INTRAVENOUS
  Administered 2019-09-22: 25 ug via INTRAVENOUS

## 2019-09-22 MED ORDER — CEFAZOLIN SODIUM-DEXTROSE 2-4 GM/100ML-% IV SOLN
2.0000 g | INTRAVENOUS | Status: AC
  Administered 2019-09-22: 2 g via INTRAVENOUS
  Filled 2019-09-22: qty 100

## 2019-09-22 MED ORDER — ONDANSETRON HCL 4 MG/2ML IJ SOLN
INTRAMUSCULAR | Status: DC | PRN
  Administered 2019-09-22: 4 mg via INTRAVENOUS

## 2019-09-22 MED ORDER — MEPERIDINE HCL 50 MG/ML IJ SOLN: 6.2500 mg | INTRAMUSCULAR | Status: DC | PRN

## 2019-09-22 MED ORDER — PROPOFOL 10 MG/ML IV BOLUS
INTRAVENOUS | Status: AC
  Filled 2019-09-22: qty 20

## 2019-09-22 MED ORDER — EPHEDRINE SULFATE-NACL 50-0.9 MG/10ML-% IV SOSY
PREFILLED_SYRINGE | INTRAVENOUS | Status: DC | PRN
  Administered 2019-09-22: 15 mg via INTRAVENOUS

## 2019-09-22 MED ORDER — GEMCITABINE CHEMO FOR BLADDER INSTILLATION 2000 MG
2000.0000 mg | Freq: Once | INTRAVENOUS | Status: AC
  Administered 2019-09-22: 2000 mg via INTRAVESICAL
  Filled 2019-09-22: qty 2000

## 2019-09-22 MED ORDER — CHLORHEXIDINE GLUCONATE 0.12 % MT SOLN
15.0000 mL | Freq: Once | OROMUCOSAL | Status: AC
  Administered 2019-09-22: 15 mL via OROMUCOSAL

## 2019-09-22 MED ORDER — PHENYLEPHRINE 40 MCG/ML (10ML) SYRINGE FOR IV PUSH (FOR BLOOD PRESSURE SUPPORT)
PREFILLED_SYRINGE | INTRAVENOUS | Status: AC
  Filled 2019-09-22: qty 10

## 2019-09-22 MED ORDER — STERILE WATER FOR IRRIGATION IR SOLN
Status: DC | PRN
  Administered 2019-09-22: 30 mL

## 2019-09-22 SURGICAL SUPPLY — 19 items
BAG DRN RND TRDRP ANRFLXCHMBR (UROLOGICAL SUPPLIES)
BAG URINE DRAIN 2000ML AR STRL (UROLOGICAL SUPPLIES) IMPLANT
BAG URO CATCHER STRL LF (MISCELLANEOUS) ×2 IMPLANT
CATH FOLEY 3WAY 30CC 24FR (CATHETERS) ×2
CATH URTH STD 24FR FL 3W 2 (CATHETERS) ×1 IMPLANT
CLOTH BEACON ORANGE TIMEOUT ST (SAFETY) ×2 IMPLANT
ELECT REM PT RETURN 15FT ADLT (MISCELLANEOUS) ×2 IMPLANT
GLOVE BIO SURGEON STRL SZ 6.5 (GLOVE) ×2 IMPLANT
GOWN STRL REUS W/TWL LRG LVL3 (GOWN DISPOSABLE) ×2 IMPLANT
KIT TURNOVER KIT A (KITS) IMPLANT
LOOP CUT BIPOLAR 24F LRG (ELECTROSURGICAL) ×2 IMPLANT
MANIFOLD NEPTUNE II (INSTRUMENTS) ×2 IMPLANT
PACK CYSTO (CUSTOM PROCEDURE TRAY) ×2 IMPLANT
PLUG CATH AND CAP STER (CATHETERS) ×2 IMPLANT
SYR 30ML LL (SYRINGE) ×2 IMPLANT
SYR TOOMEY IRRIG 70ML (MISCELLANEOUS) ×2
SYRINGE TOOMEY IRRIG 70ML (MISCELLANEOUS) ×1 IMPLANT
TUBING CONNECTING 10 (TUBING) ×2 IMPLANT
TUBING UROLOGY SET (TUBING) ×2 IMPLANT

## 2019-09-22 NOTE — Anesthesia Procedure Notes (Signed)
Procedure Name: LMA Insertion Date/Time: 09/22/2019 1:49 PM Performed by: [place] Dyann Goodspeed C, CRNA Pre-anesthesia Checklist: Patient identified, Emergency Drugs available, Suction available and Patient being monitored Patient Re-evaluated:Patient Re-evaluated prior to induction Oxygen Delivery Method: Circle system utilized Preoxygenation: Pre-oxygenation with 100% oxygen Induction Type: IV induction Ventilation: Mask ventilation without difficulty LMA: LMA inserted LMA Size: 4.0 Number of attempts: 1 Airway Equipment and Method: Bite block Placement Confirmation: positive ETCO2 Tube secured with: Tape Dental Injury: Teeth and Oropharynx as per pre-operative assessment

## 2019-09-22 NOTE — Anesthesia Postprocedure Evaluation (Signed)
Anesthesia Post Note  Patient: Lisa [name]  Procedure(s) Performed: TRANSURETHRAL RESECTION OF BLADDER TUMOR WITH INTRAVESICAL GEMCITABINE (N/A )     Patient location during evaluation: PACU Anesthesia Type: General Level of consciousness: awake and alert Pain management: pain level controlled Vital Signs Assessment: post-procedure vital signs reviewed and stable Respiratory status: spontaneous breathing, nonlabored ventilation, respiratory function stable and patient connected to nasal cannula oxygen Cardiovascular status: blood pressure returned to baseline and stable Postop Assessment: no apparent nausea or vomiting Anesthetic complications: no   No complications documented.  Last Vitals:  Vitals:   09/22/19 1517 09/22/19 1530  BP: (!) 169/77 (!) 171/75  Pulse: 79 78  Resp: (!) 23 (!) 22  Temp:    SpO2: 100% 100%    Last Pain:  Vitals:   09/22/19 1530  TempSrc:   PainSc: 0-No pain                 [name] DAVID

## 2019-09-22 NOTE — Plan of Care (Signed)
  Problem: Health Behavior/Discharge Planning: Goal: Ability to manage health-related needs will improve Outcome: Progressing   Problem: Clinical Measurements: Goal: Will remain free from infection Outcome: Progressing   Problem: Clinical Measurements: Goal: Will remain free from infection Outcome: Progressing

## 2019-09-22 NOTE — Progress Notes (Signed)
This chaplain followed up with chart check for Pt. availability to notarize HCPOA before procedure.  The chaplain understands the Pt. has already transferred to [facility]. F/U spiritual care is available as needed.

## 2019-09-22 NOTE — Progress Notes (Signed)
[facility] notified pt anticipated to be ready for transport @ 1630.

## 2019-09-22 NOTE — Progress Notes (Signed)
Initial Nutrition Assessment  DOCUMENTATION CODES:   Obesity unspecified  INTERVENTION:  Once pt returns and diet is advanced, provide Ensure Enlive po BID, each supplement provides 350 kcal and 20 grams of protein  MVI daily   NUTRITION DIAGNOSIS:   Inadequate oral intake related to poor appetite as evidenced by per patient/family report.    GOAL:   Patient will meet greater than or equal to 90% of their needs    MONITOR:   PO intake, Supplement acceptance, Diet advancement, Skin, Weight trends, Labs, I & O's  REASON FOR ASSESSMENT:   Consult Assessment of nutrition requirement/status  ASSESSMENT:   Pt presenting with gross hematuria and symptomatic anemic 2/2 papillary carcinoma of the bladder. 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  PMH significant for recent ischemic CVA, recent carotid stent placement, HTN, HLD, DM2.  8/5 admit 8/6 transfused 2u PRBC 8/7 transfused 1u PRBC 8/8 transfused 2u PRBC; bilateral DVTs on vascular US; heparin gtt started  Pt unavailable at time of RD visit.  Pt scheduled for TURBT at [facility] today.    Per H&P, pt's husband noted that pt had not been eating well PTA. Pt also noted by MD to have continued poor intake since admit, which pt attributes to not liking the food provided from the hospital. Pt eager to return home.   Per wt readings, pt weighed 91.6kg on 08/12/19 and now weighs 80.5 kg. This indicates an 11.9% wt loss x 2 months, which is significant for time frame.   Pt is likely malnourished; however, unable to diagnose at this time without detailed diet history and/or nutrition-focused physical exam.   No PO intake documented.   Labs and medications reviewed.  NUTRITION - FOCUSED PHYSICAL EXAM:  Unable to perform at this time, will attempt at follow-up.   Diet Order:   Diet Order            Diet NPO time specified  Diet effective midnight           Diet NPO time specified  Diet  effective midnight                 EDUCATION NEEDS:   Not appropriate for education at this time  Skin:  Skin Assessment: Skin Integrity Issues: Skin Integrity Issues:: Incisions, Stage II Stage II: second toe R foot Incisions: vagina, neck  Last BM:  8/9 type 6  Height:   Ht Readings from Last 1 Encounters:  08/31/19 5\' 4"  (1.626 m)    Weight:   Wt Readings from Last 10 Encounters:  09/17/19 80.5 kg  09/05/19 81.3 kg  08/20/19 87.3 kg  08/19/19 87.3 kg  08/19/19 86.6 kg  08/12/19 91.6 kg     BMI:  Body mass index is 30.46 kg/m.  Estimated Nutritional Needs:   Kcal:  1900-2100  Protein:  95-105 grams  Fluid:  >/= 1.9L/d    Larkin Ina, MS, RD, LDN RD pager number and weekend/on-call pager number located in [place].

## 2019-09-22 NOTE — Progress Notes (Signed)
Pt remained NPO post MN foley irrigated intermittently with 120cc os sterile water urine remained bloody but no clot noted. Pt made aware of the procedure in [facility] today ,verbalized good understanding. Heparin stopped per MD order

## 2019-09-22 NOTE — Progress Notes (Signed)
Family Medicine Teaching Service Daily Progress Note Intern Pager: [phone]  Patient name: Lisa [name] Medical record number: [id number] Date of birth: May 28, 1950 Age: 69 y.o. Gender: female  Primary Care Provider: Patient, No Pcp Per Consultants: Neurology, Vascular Surgery, Urology Code Status: FULL  Pt Overview and Major Events to Date:  09/17/2019 - admitted, received 1u PRBC 09/18/2019 - Transfused2u PRBC 09/19/2019 -Transfused1u PRBC 09/20/2019 - Transfused 2U PRBC 09/20/2019 - Bilateral DVTs on vascular US; heparin gtt started 09/22/2019 -scheduled forTransUrethralResection ofBladderTumor (TURBT) at [place] and Plan: Elba Schaber is a 36 y.ofemale who presented with gross hematuria and blood loss anemia in setting of papillary carcinoma of the bladder. PMH significant for previous ischemic CVA with residual neurological defects, carotid stent placement due to carotid atherosclerosis, HTN, HLD, and type 2 diabetes.  Acute blood loss anemia 2/2 Papillary Carcinoma of the Bladder: Stable Hemoglobin 9.8>10.6>10.1>9.7 since yesterday. Transfusion threshold of 8. Patient is scheduled to be transported by [facility] this morning to [facility] for TURBT this afternoon. Heparin drip stopped at 6AM. Patient to be transferred back to our care after procedure. Patient is understanding of procedure she is undergoing today, has capacity to sign consent form.  - Gentle fluids this AM - PM H&H, transfuse as needed - Tylenol 650 mg PO or rectal q6h mild pain, fever - Daily CBC's  Acute Bilateral DVTs  Stopped heparin at 6 this morning. Plan to restart after procedure. 98-99% on room air overnight. Pulse 75-82 overnight. - Restart heparin gtt after procedure, appreciate pharmacy's assistance with dosing - Cardiac monitor - Monitor pulse oximetry  Acute Lacunar Infarct Continues to have aphasia, particularly trouble with word finding. Answers yes/no questions  appropriately, appears oriented and has capacity.  - Currently holding ASA 81mg , plan to restart after procedure today  Goals of Care Palliative care consult to discuss goals of care yesterday. Patient with capacity, able to make decisions for herself. Stated that she would like [name] to be her HCPOA. She would like to proceed with procedure today, would like to be full code and is open to all offered and available medial interventions to prolong life. She would like continued evaluation and treatment for her stroke and further stroke prevention. Patient wishes to return to family home in [place] once stable. - We will continue medical interventions as stated above  - We will work towards safe disposition once patient medically stable  Hypertension: chronic, stable AM BP: 155/70. Home medications: Losartan 25mg , metoprolol 25mg , amlodipine 10mg . Goal systolic 147-829. - On amlodipine 2.5 mg. Hold this AM for procedure - Restart after procedure - Continue to monitor, adjust as necessary   Carotid atherosclerosishyperlipidemia: stable Patient s/p left TCAR (08/20/2019) for symptomatic left carotid stenosis. Patient was started on aspirin and Plavix post-procedure. Vascular US this admission showed <50% stenosis of left carotid and 1-39% stenosis of right carotid artery.  -Continue atorvastatin 40 mg daily -Per vascular can continue aspirin, hold Plavix.We are currently holding both. Plan to restart ASA after procedure today.  Type 2 DM: chronic, stable HbA1c: 6.5%. Home medications: Metformin 500mg . CBG's overnight 113-127.  - Will discontinue daily CBG's and SSI - holding home metformin   FEN/GI: NPO for procedure PPx: heparin gtt; held at 6AM for procedure  Disposition: [facility] for [facility] today, we will assume care after.  Subjective:  Patient states she is resting before procedure today. Her sister in law brought her flowers. Denies chest pain, trouble breathing or  abdominal pain. Understands  that she is undergoing a procedure for her bladder cancer.   Objective: Temp:  [97.6 F (36.4 C)-98.3 F (36.8 C)] 98.3 F (36.8 C) (08/10 0354) Pulse Rate:  [75-88] 75 (08/10 0354) Resp:  [14-20] 18 (08/10 0354) BP: (135-169)/(65-79) 147/69 (08/10 0354) SpO2:  [98 %-100 %] 99 % (08/10 0354) Physical Exam: General: Awake, alert Cardiovascular: RRR Respiratory: CTAB Abdomen: soft, non-distended, non-tender in all quadrants GU: No bleeding visualized on external examination  Laboratory: Recent Labs  Lab 09/20/19 0228 09/20/19 0228 09/21/19 0128 09/21/19 0128 09/21/19 0802 09/21/19 2108 09/22/19 0323  WBC 9.2  --  9.4  --   --   --  10.0  HGB 7.5*   < > 9.8*   < > 10.6* 10.1* 9.7*  HCT 22.9*   < > 30.1*   < > 32.3* 30.6* 30.3*  PLT 287  --  273  --   --   --  280   < > = values in this interval not displayed.   Recent Labs  Lab 09/20/19 0228 09/21/19 0128 09/22/19 0323  NA 133* 135 136  K 4.9 3.9 3.6  CL 99 102 100  CO2 23 25 21*  BUN 20 14 8   CREATININE 1.17* 0.67 0.63  CALCIUM 8.4* 8.5* 8.4*  PROT 4.9* 5.1* 5.2*  BILITOT 0.9 1.0 0.8  ALKPHOS 93 87 79  ALT 13 14 13   AST 17 16 17   GLUCOSE 127* 113* 103*    Imaging/Diagnostic Tests: None new  Sharion Settler, DO 09/22/2019, 5:50 AM PGY-1, [place] Intern pager: [phone], text pages welcome

## 2019-09-22 NOTE — Progress Notes (Signed)
FMTS Note Called [facility] to ensure patient arrived safely on unit---- Spoke with RN [place] at [phone]. Patient doing well. Discussed baseline asphasia, how to ask questions to help understand patient needs. Asked [place] to contact [facility] per patient's request (asking to speak with [facility]).   Dorris Singh, MD  Family Medicine Teaching Service

## 2019-09-22 NOTE — Progress Notes (Signed)
Went to check in on patient at [place]. She was sleeping but easiily awoken.  She had no complaints. Discussed with pt's nurse stopping her heparin now for her procedure today. Witnessed heparin being turned off by nurse.  Pt had some hematuria in her foley bag. Nurse stated she flushed the catheter last night and will do so again shortly.  Nurse tech also came in during this time and checked blood sugar, which was 110.     [name], MD PGY-3

## 2019-09-22 NOTE — Anesthesia Preprocedure Evaluation (Signed)
Anesthesia Evaluation  Patient identified by MRN, date of birth, ID band Patient awake    Reviewed: Allergy & Precautions, NPO status , Patient's Chart, lab work & pertinent test results  Airway Mallampati: I  TM Distance: >3 FB Neck ROM: Full    Dental   Pulmonary former smoker,    Pulmonary exam normal        Cardiovascular hypertension, Pt. on medications Normal cardiovascular exam     Neuro/Psych CVA    GI/Hepatic   Endo/Other  diabetes, Type 2  Renal/GU      Musculoskeletal   Abdominal   Peds  Hematology   Anesthesia Other Findings   Reproductive/Obstetrics                             Anesthesia Physical Anesthesia Plan  ASA: III  Anesthesia Plan: General   Post-op Pain Management:    Induction: Intravenous  PONV Risk Score and Plan: 3 and Ondansetron, Treatment may vary due to age or medical condition and Midazolam  Airway Management Planned: LMA  Additional Equipment:   Intra-op Plan:   Post-operative Plan: Extubation in OR  Informed Consent: I have reviewed the patients History and Physical, chart, labs and discussed the procedure including the risks, benefits and alternatives for the proposed anesthesia with the patient or authorized representative who has indicated his/her understanding and acceptance.       Plan Discussed with: CRNA and Surgeon  Anesthesia Plan Comments:         Anesthesia Quick Evaluation

## 2019-09-22 NOTE — Progress Notes (Signed)
   Patient recovering well from L thalamic infarct likely 2/2 small vessel disease. Left ICA stent without evidence of recurrent disease. Will need at least single antiplatelet agent when safe from urology standpoint. Vascular will be available as needed. We will delay her office f/u visit for a few weeks as it is currently scheduled for this Friday.  Servando Snare, MD

## 2019-09-22 NOTE — Progress Notes (Signed)
PT Cancellation Note  Patient Details Name: Lisa [name] MRN: [id number] DOB: 08-05-1950   Cancelled Treatment:    Reason Eval/Treat Not Completed: Patient at procedure or test/unavailable. Pt transferred to [facility] for removal of tumor in bladder. Will check back the next available date to continue with PT POC.    Thelma Comp 09/22/2019, 10:17 AM   Rolinda Roan, PT, DPT Acute Rehabilitation Services Pager: [phone] Office: [phone]

## 2019-09-22 NOTE — Op Note (Signed)
PATIENT:  Lisa [name]  PRE-OPERATIVE DIAGNOSIS: High-grade T1 urothelial cell carcinoma  POST-OPERATIVE DIAGNOSIS: Same  PROCEDURE:  Procedure(s): 1. Restaging TRANSURETHRAL RESECTION OF BLADDER TUMOR (TURBT) (>2cm.) 2. Instillation of intravesical chemotherapy (Gemcitabine)  SURGEON:  Jacalyn Lefevre, MD  ANESTHESIA:   General  EBL:  minimal  DRAINS: Urethral catheter (24Fr. 3-way Foley with inflow capped)   SPECIMEN:  1.  Right posterior wall bladder tumor (>2 cm total) 2.  Right anterior bladder wall tumor  DISPOSITION OF SPECIMEN:  PATHOLOGY  Indication: 69 year old woman with a recent hospital admission for CVA who initially had gross hematuria and found to have large clot burden in her bladder.  She was taken to the operating room and found to have significant bladder tumor burden as well as clot retention.  Pathology returned as high-grade T1 UCC.  She has since been readmitted to the hospital with a second CVA as well as bilateral DVTs and has been maintained on a heparin drip also requiring multiple blood transfusions with gross hematuria.  While she is hospitalized we have decided to proceed with a restaging TURBT and intravesical gemcitabine.  Description of operation: The patient was taken to the operating room and administered general anesthesia. They were then placed on the table and moved to the dorsal lithotomy position.  The pre-existing 24 [place] three-way Foley was then removed andthe genitalia was sterilely prepped and draped. An official timeout was then performed.  The 79 French resectoscope with the 30 lens and visual obturator were then passed into the bladder under direct visualization. Urethra appeared normal. The visual obturator was then removed and the Gyrus resectoscope element with 30  lens was then inserted and the bladder was fully and systematically inspected. Ureteral orifices were noted to be in the normal anatomic positions.   The prior TUR  site was the right posterior bladder wall as well as tumor still present at the right anterior bladder neck.  The posterior wall appeared more like a scar with some shaggy tissue friable tissue.  Anterior bladder neck revealed more papillary growth.  I resected the shaggy tissue from the right posterior wall as well as the anterior bladder neck with the bipolar resectoscope.  Cautery was then used to achieve hemostasis.  Reinspection of the bladder revealed all obvious tumor had been fully resected and there was no evidence of perforation. The Toomey syringe was then used to irrigate the bladder and remove all of the portions of bladder tumor which were sent to pathology. I then removed the resectoscope.  A 24 French 3-way Foley catheter was then inserted in the bladder and the inflow capped.  The patient was awakened and taken to the recovery room.  While in the recovery room 2 g of gemcitabine in 52.6 cc of sterile water was instilled in the bladder through the catheter and the catheter was plugged. This will remain indwelling for approximately one hour. It will then be drained from the bladder and the catheter will be removed and the patient discharged home.  PLAN OF CARE: Discharge to home after PACU  PATIENT DISPOSITION:  PACU - hemodynamically stable.  She will be transferred back to [facility] for continued treatment of her CVA and DVTs.  If her Foley remains clear tomorrow then we can do a voiding trial.

## 2019-09-22 NOTE — Interval H&P Note (Signed)
History and Physical Interval Note: Patient had prior TURBT during last hospitalization which showed high-grade T1 urothelial cell carcinoma.  She has subsequently been readmitted to the hospital and found to have another stroke as well as bilateral DVTs.  She was started on heparin drip and has also required multiple blood transfusions.  She is having gross hematuria which clears fairly easily with hand irrigation.  Yesterday a family discussion was had between her sister-in-law, [name], myself, the patient, palliative care and internal medicine.  The risks, benefits and alternatives of proceeding with a restaging TURBT while she was hospitalized were discussed.  She has agreed to move forward.  She has been transferred over to [facility] so that this may happen in a urology staffed room.   09/22/2019 11:30 AM  Lisa [name]  has presented today for surgery, with the diagnosis of BLADDER CANCER.  The various methods of treatment have been discussed with the patient and family. After consideration of risks, benefits and other options for treatment, the patient has consented to  Procedure(s) with comments: TRANSURETHRAL RESECTION OF BLADDER TUMOR WITH GEMCITABINE (N/A) - 90 MINS as a surgical intervention.  The patient's history has been reviewed, patient examined, no change in status, stable for surgery.  I have reviewed the patient's chart and labs.  Questions were answered to the patient's satisfaction.     [name] D Erienne Spelman

## 2019-09-22 NOTE — Transfer of Care (Signed)
Immediate Anesthesia Transfer of Care Note  Patient: Lisa [name]  Procedure(s) Performed: TRANSURETHRAL RESECTION OF BLADDER TUMOR WITH INTRAVESICAL GEMCITABINE (N/A )  Patient Location: PACU  Anesthesia Type:General  Level of Consciousness: awake, alert  and oriented  Airway & Oxygen Therapy: Patient Spontanous Breathing and Patient connected to face mask oxygen  Post-op Assessment: Report given to RN, Post -op Vital signs reviewed and stable and Patient moving all extremities X 4  Post vital signs: Reviewed and stable  Last Vitals:  Vitals Value Taken Time  BP    Temp    Pulse 79 09/22/19 1449  Resp 21 09/22/19 1449  SpO2 100 % 09/22/19 1449  Vitals shown include unvalidated device data.  Last Pain:  Vitals:   09/22/19 1200  TempSrc:   PainSc: Asleep      Patients Stated Pain Goal: 0 (59/13/68 5992)  Complications: No complications documented.

## 2019-09-22 NOTE — Progress Notes (Signed)
SLP Cancellation Note  Patient Details Name: Lisa [name] MRN: [id number] DOB: 12-07-1950   Cancelled treatment:       Reason Eval/Treat Not Completed: Patient at procedure or test/unavailable Pt transferred to [facility] for removal of tumor in bladder. Unavailable for therapy this date. Will continue efforts.  [name] P. Hanni Milford, M.S., [place] Pathologist Acute Rehabilitation Services Pager: [phone] 09/22/2019, 11:16 AM

## 2019-09-22 NOTE — Progress Notes (Signed)
FMTS Brief Note  S: In to see patient after procedure. Alert, oriented to person, place. Knows she had a procedure today.  [name] and [place] discussing patient's husband. He is currently in [facility]. [place] is pleased he is getting the help he needs. She is worried about him--- wants him to get proper help. Still not certain she wants him involved in her life.   She denies pain.  Today's Vitals   09/22/19 1545 09/22/19 1600 09/22/19 1615 09/22/19 1630  BP: (!) 169/81 (!) 155/136 (!) 172/79 (!) 107/92  Pulse: 77 80 78 78  Resp: (!) 23 19 (!) 21 (!) 21  Temp:      TempSrc:      SpO2: 100% 100% 100% 100%  Weight:      PainSc: 0-No pain 0-No pain 0-No pain 0-No pain   Body mass index is 30.46 kg/m. Pleasant, appropriate Moving all extremities Trace edema Catheter draining clear urine, no clot   Bladder Tumor s/p Resection - Tylenol and Oxycodone for pain control - PM H/H, transfuse if <8 - MIVF 50 cc /hr - Appreciate Nephrology care and consultation  - Hold heparin per Urology, discussed with Dr. Claudia Desanctis via secure chat  - Restart aspirin as below   Bilateral DVT - Per discussion with Dr. Rande Lawman heparin if able on 8/11, monitor CBC   Lacunar Stroke - Restart aspirin tonight, okay with Urology - Amlodipine started 8/11  Disposition- PT/OT tomorrow. Likely will need SNF.   Family updated at bedside.  Dorris Singh, MD  Family Medicine Teaching Service

## 2019-09-23 ENCOUNTER — Encounter (HOSPITAL_COMMUNITY): Payer: Self-pay | Admitting: Urology

## 2019-09-23 DIAGNOSIS — R4701 Aphasia: Secondary | ICD-10-CM

## 2019-09-23 DIAGNOSIS — I6522 Occlusion and stenosis of left carotid artery: Secondary | ICD-10-CM | POA: Diagnosis not present

## 2019-09-23 DIAGNOSIS — I6381 Other cerebral infarction due to occlusion or stenosis of small artery: Secondary | ICD-10-CM | POA: Diagnosis not present

## 2019-09-23 DIAGNOSIS — D62 Acute posthemorrhagic anemia: Secondary | ICD-10-CM | POA: Diagnosis not present

## 2019-09-23 DIAGNOSIS — C679 Malignant neoplasm of bladder, unspecified: Secondary | ICD-10-CM | POA: Diagnosis not present

## 2019-09-23 LAB — BASIC METABOLIC PANEL
Anion gap: 12 (ref 5–15)
BUN: 8 mg/dL (ref 8–23)
CO2: 23 mmol/L (ref 22–32)
Calcium: 8.4 mg/dL — ABNORMAL LOW (ref 8.9–10.3)
Chloride: 101 mmol/L (ref 98–111)
Creatinine, Ser: 0.63 mg/dL (ref 0.44–1.00)
GFR calc Af Amer: >60 mL/min (ref >60)
GFR calc non Af Amer: >60 mL/min (ref >60)
Glucose, Bld: 111 mg/dL — ABNORMAL HIGH (ref 70–99)
Potassium: 3.7 mmol/L (ref 3.5–5.1)
Sodium: 136 mmol/L (ref 135–145)

## 2019-09-23 LAB — CBC
HCT: 31.3 % — ABNORMAL LOW (ref 36.0–46.0)
Hemoglobin: 10.1 g/dL — ABNORMAL LOW (ref 12.0–15.0)
MCH: 28.1 pg (ref 26.0–34.0)
MCHC: 32.3 g/dL (ref 30.0–36.0)
MCV: 86.9 fL (ref 80.0–100.0)
Platelets: 312 10*3/uL (ref 150–400)
RBC: 3.6 MIL/uL — ABNORMAL LOW (ref 3.87–5.11)
RDW: 18 % — ABNORMAL HIGH (ref 11.5–15.5)
WBC: 7.9 10*3/uL (ref 4.0–10.5)
nRBC: 0 % (ref 0.0–0.2)

## 2019-09-23 LAB — GLUCOSE, CAPILLARY
Glucose-Capillary: 104 mg/dL — ABNORMAL HIGH (ref 70–99)
Glucose-Capillary: 93 mg/dL (ref 70–99)
Glucose-Capillary: 127 mg/dL — ABNORMAL HIGH (ref 70–99)
Glucose-Capillary: 105 mg/dL — ABNORMAL HIGH (ref 70–99)

## 2019-09-23 LAB — SURGICAL PATHOLOGY

## 2019-09-23 MED ORDER — SODIUM CHLORIDE 0.9 % IV SOLN: INTRAVENOUS | Status: DC

## 2019-09-23 MED ORDER — HEPARIN (PORCINE) 25000 UT/250ML-% IV SOLN
1150.0000 [IU]/h | INTRAVENOUS | Status: DC
  Administered 2019-09-23: 1150 [IU]/h via INTRAVENOUS
  Administered 2019-09-24: 1250 [IU]/h via INTRAVENOUS
  Administered 2019-09-25: 1400 [IU]/h via INTRAVENOUS
  Administered 2019-09-26: 1350 [IU]/h via INTRAVENOUS
  Administered 2019-09-26: 1300 [IU]/h via INTRAVENOUS
  Administered 2019-09-27: 1150 [IU]/h via INTRAVENOUS
  Filled 2019-09-23 (×5): qty 250

## 2019-09-23 NOTE — Progress Notes (Signed)
PT Cancellation Note  Patient Details Name: Lisa [name] MRN: [id number] DOB: 1950-11-23   Cancelled Treatment:    Reason Eval/Treat Not Completed: Medical issues which prohibited therapy Per chart review, patient with considerable bilateral acute DVTs per doppler performed 8/8. Had been on Heparin which was stopped on 8/10 for procedure. Per MD notes, plan is to restart heparin today if able. No heparin level available for today- given acuity and extent of DVTs, holding for now to allow for anticoagulation to be restarted prior to continuing mobility. If patient has urgent rehab needs, please direct message this therapist in [place] (8am-4pm) or page at number below.    Windell Norfolk, DPT, PN1   Supplemental Physical Therapist [facility]    Pager [phone] Acute Rehab Office [phone]

## 2019-09-23 NOTE — Progress Notes (Signed)
STROKE TEAM PROGRESS NOTE   INTERVAL HISTORY No family at bedside. Pt was seen after urology procedure.  As per urology report, patient has high-grade T1 urethral cell carcinoma status post resection.  Patient currently has no hematuria.  Heparin IV on hold, will restart aspirin 81.  Patient lying in bed, awake alert, still has expressive aphasia.  Regarding TEE, patient stated that she would like to consider in the future but not at this time.  Vitals:   09/22/19 1630 09/22/19 1708 09/22/19 2002 09/22/19 2318  BP: (!) 107/92 (!) 158/68 127/67 135/65  Pulse: 78 80 78 79  Resp: (!) 21 20 17 17   Temp:  98.2 F (36.8 C) 97.7 F (36.5 C) 98 F (36.7 C)  TempSrc:  Oral Oral   SpO2: 100% 98% 95% 96%  Weight:       CBC:  Recent Labs  Lab 09/21/19 0128 09/21/19 0802 09/22/19 0323 09/22/19 1832  WBC 9.4  --  10.0  --   NEUTROABS 6.3  --   --   --   HGB 9.8*   < > 9.7* 10.6*  HCT 30.1*   < > 30.3* 33.6*  MCV 83.8  --  85.4  --   PLT 273  --  280  --    < > = values in this interval not displayed.   Basic Metabolic Panel:  Recent Labs  Lab 09/21/19 0128 09/22/19 0323  NA 135 136  K 3.9 3.6  CL 102 100  CO2 25 21*  GLUCOSE 113* 103*  BUN 14 8  CREATININE 0.67 0.63  CALCIUM 8.5* 8.4*   Lipid Panel:  Recent Labs  Lab 09/18/19 0421  CHOL 94  TRIG 82  HDL 32*  CHOLHDL 2.9  VLDL 16  LDLCALC 46   HgbA1c:  Recent Labs  Lab 09/18/19 0421  HGBA1C 6.5*   Urine Drug Screen: No results for input(s): LABOPIA, COCAINSCRNUR, LABBENZ, AMPHETMU, THCU, LABBARB in the last 168 hours.  Alcohol Level No results for input(s): ETH in the last 168 hours.  IMAGING past 24 hours No results found.   Echocardiogram 09/19/19 IMPRESSIONS  1. Left ventricular ejection fraction, by estimation, is 60 to 65%. The  left ventricle has normal function. The left ventricle has no regional  wall motion abnormalities. Left ventricular diastolic parameters are  consistent with Grade I diastolic   dysfunction (impaired relaxation).  2. Right ventricular systolic function is moderately reduced. The right  ventricular size is normal. There is normal pulmonary artery systolic  pressure.  3. The mitral valve is normal in structure. No evidence of mitral valve  regurgitation.  4. The aortic valve is normal in structure. Aortic valve regurgitation is  not visualized. No aortic stenosis is present.   PHYSICAL EXAM  Temp:  [97.7 F (36.5 C)-98.9 F (37.2 C)] 98 F (36.7 C) (08/10 2318) Pulse Rate:  [74-81] 79 (08/10 2318) Resp:  [16-24] 17 (08/10 2318) BP: (107-172)/(65-136) 135/65 (08/10 2318) SpO2:  [95 %-100 %] 96 % (08/10 2318)  General - Well nourished, well developed, not in acute distress.  Ophthalmologic - fundi not visualized due to noncooperation.  Cardiovascular - Regular rhythm and rate.  Neuro - awake alert interactive, orientated to place, but answered age, time were wrong. Expressive aphasia with frequent paraphasic errors and hesitancy of speech, intermittent word salad.  No dysarthria. Extraocular movements full range without nystagmus. Visual field full. Right lower facial weakness.  Tongue midline.  Motor system exam shows symmetric upper and  lower extremity strength without drift. Sensation symmetrical, coordination intact FTN bilaterally. Gait not tested.   ASSESSMENT/PLAN Ms. AMBERIA BAYLESS is a 69 y.o. female with history of prior stroke in June 2021, intracranial and extracranial atherosclerotic disease with 80 to 99% left ICA stenosis on dual antiplatelet therapy with aspirin and Plavix, diabetes, hypertension, hyperlipidemia, and known bladder cancer presenting with weakness and gross hematuria resulting in symptomatic anemia.   Stroke: L thalamic infarct in setting of intracranial stenosis and symptomatic anemia due to hematuria  CT head L lentiform nucleus lacunar infarct, new from MRI 08/12/2019. Recent L MCA territory infarct w/ cortical laminar  necrosis. Small vessel disease. Atrophy.   MRI  Acute/subacute posteromedial L thalamic infarct. Late subacute subcentimeter subcortical posterior L frontal lobe infarct. L lentiform nucleus lacune new since 08/12/19. chronic large L MCA territory infarct w/ mild cortical enhancement  MRA  No LVO. Multifocal stenoses:  L M1 mild / moderate, proximal superior and inferior division L M2 branches moderate, mid L M2 inferior division high-grade focal, proximal to mid superior division R M2 severe, R PCA at P2/P3 jxn moderate to moderate severe, L P3 and distal LPCA moderate    Carotid US - left ICA stent patent  2D Echo - 09/19/19 - EF 60 - 65%. No cardiac source of emboli identified.   TEE - pt declined TEE at this time - will not change management as pt already on [facility]  LE venous doppler - b/l DVTs  LDL 158  HgbA1c 8.2  VTE prophylaxis - SCDs   aspirin 81 mg daily and clopidogrel 75 mg daily prior to admission, now on ASA 81. 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 PE  Could be due to hypercoagulable state given malignancy  Pt declined CTA chest at this time  LE venous doppler showed b/l LE DVTs  Was on heparin IV, currently on hold post op for another day  Recommend to switch to DOAC on discharge  ?? AV mobile density  ?? limban-Sacks endocarditis given malignancy??  2D Echo 08/2019 linear mobile density 0.6cm on aortic valve. EF 60-65%   TEE recommended last admission, pt wanted to do as outpt. Not yet done  Repeat 2D this time showed AV is normal in structure  Pt declined TEE at this time - will not change management as pt already on [facility]  Severe anemia   Hgb -  6.9->7.1->8.1->7.5->9.7  Received PRBC 8/7  On ASA and heparin IV  CBC monitoring  Hx of recent stroke  08/2019 left MCA infarct, CT left MCA infarct, CTA head and neck left ICA bulb near occlusion, left M2 occlusion and M1 stenosis. B/l P2/P3 stenosis. MRI left MCA subacute infarct. CUS left ICA 80-99% stenosis. 2D echo concerning for AV mobile density, EF 60-65%. LDL 158 and A1C 8.2. UDS + THC. Put on DAPT and statin. VVS recommend TCAR as outpt and plan for TEE as outpt  Carotid Stenosis, Left  S/p left TCAR w/ stent 7/8 [facility]). d/c on aspirin, plavix and statin.   CUS left ICA patent  Multifocal intracranial stenosis  CTA head slow flow L M2 and distal L MCA vessels w/  superior and inferior division proximal L M2 branch occlusions. L M1 moderate stenosis. R M2 proximal to superior division moderate to severe focal stenosis. R A2 distal moderate focal stenosis. R P2/P3 high-grade focal stenosis. L P2/P3 moderate severe focal stenosis.  MRA  No LVO. Multifocal stenoses:  L M1 mild / moderate, proximal superior and inferior division L M2 branches moderate, mid L M2 inferior division high-grade focal, proximal to mid superior division R M2 severe, R PCA at P2/P3 jxn moderate to moderate severe, L P3 and distal LPCA moderate    On DAPT PTA  Current thalamic infarct likely due to PCA stenosis in the setting of symptomatic anemia  Now on ASA 81 only  Avoid low BP, BP goal 130-150  Hypertension  Stable . SBP goal normotensive . Avoid low BP  . BP goal 130-150  Hyperlipidemia  Home meds:  lipitor 80 mg daily  Dose decreased to 40 mg daily on admission  LDL 46, goal < 70  Continue statin at discharge  Diabetes type II, controlled  HgbA1c 6.5, goal < 7.0  SSI  CBGs  PCP follow up  Tobacco abuse  Current smoker  Smoking cessation counseling will be provided  Other Stroke Risk Factors  Advanced age  Hx ETOH use, advised to drink no more than 1 drink(s) a  day  Hx Substance abuse - THC  Obesity, Body mass index is 30.46 kg/m., recommend weight loss, diet and exercise as appropriate   Other Active Problems    Hospital day # 6  Neurology will sign off. Please call with questions. Pt will follow up with stroke clinic Dr. Leonie Man at [facility] in about 4 weeks. Thanks for the consult.   Rosalin Hawking, MD PhD Stroke Neurology 09/23/2019 12:02 AM    To contact Stroke Continuity provider, please refer to [email or web address] After hours, contact General Neurology

## 2019-09-23 NOTE — Progress Notes (Signed)
Urology Inpatient Progress Report  Gross hematuria [R31.0] Acute lacunar stroke ([place]) [I63.81] Lacunar infarct, acute ([place]) [I63.81] Symptomatic anemia [D64.9]  Procedure(s): TRANSURETHRAL RESECTION OF BLADDER TUMOR WITH INTRAVESICAL GEMCITABINE  1 Day Post-Op   Intv/Subj: No acute events overnight.  Patient is without complaint.  No issues with foley overnight.  Principal Problem:   Lacunar infarct, acute ([place]) Active Problems:   CVA (cerebral vascular accident) ([place])   Acute ischemic stroke ([place])   Hypertension   DM (diabetes mellitus), type 2, uncontrolled w/neurologic complication ([place])   Stenosis of carotid artery   Hematuria   Primary papillary carcinoma of bladder ([place])   Acute blood loss anemia   Aphasia   Palliative care by specialist   DNR (do not resuscitate) discussion  Current Facility-Administered Medications  Medication Dose Route Frequency Provider Last Rate Last Admin  .  stroke: mapping our early stages of recovery book   Does not apply Once Milus Banister C, DO      . 0.9 %  sodium chloride infusion (Manually program via Guardrails IV Fluids)   Intravenous Once Simmons-Robinson, Makiera, MD 10 mL/hr at 09/20/19 1420 Restarted at 09/20/19 1420  . 0.9 %  sodium chloride infusion   Intravenous Continuous Martyn Malay, MD 50 mL/hr at 09/22/19 1758 New Bag at 09/22/19 1758  . acetaminophen (TYLENOL) tablet 650 mg  650 mg Oral Q6H PRN Daisy Floro, DO   650 mg at 09/18/19 1819   Or  . acetaminophen (TYLENOL) suppository 650 mg  650 mg Rectal Q6H PRN Daisy Floro, DO      . amLODipine (NORVASC) tablet 2.5 mg  2.5 mg Oral Daily Martyn Malay, MD      . aspirin chewable tablet 81 mg  81 mg Oral Daily Martyn Malay, MD   81 mg at 09/22/19 1818  . atorvastatin (LIPITOR) tablet 40 mg  40 mg Oral Daily Martyn Malay, MD   40 mg at 09/21/19 1034  . bisacodyl (DULCOLAX) suppository 10 mg  10 mg Rectal Daily PRN Daisy Floro, DO      .  Chlorhexidine Gluconate Cloth 2 % PADS 6 each  6 each Topical Daily Martyn Malay, MD   6 each at 09/20/19 1000  . feeding supplement (ENSURE ENLIVE) (ENSURE ENLIVE) liquid 237 mL  237 mL Oral BID BM Martyn Malay, MD      . multivitamin with minerals tablet 1 tablet  1 tablet Oral Daily Martyn Malay, MD   1 tablet at 09/22/19 1818  . opium-belladonna (B&O) suppository 16.2-60mg   1 suppository Rectal Q8H PRN Delight Hoh, MD   1 suppository at 09/18/19 2307  . oxyCODONE (Oxy IR/ROXICODONE) immediate release tablet 5 mg  5 mg Oral Q4H PRN Simmons-Robinson, Makiera, MD   5 mg at 09/18/19 2250  . polyethylene glycol (MIRALAX / GLYCOLAX) packet 17 g  17 g Oral Daily PRN Milus Banister C, DO         Objective: Vital: Vitals:   09/22/19 2318 09/23/19 0348 09/23/19 0715 09/23/19 0719  BP: 135/65 132/62  140/64  Pulse: 79 78  78  Resp: 17 20 (!) 25 20  Temp: 98 F (36.7 C) 97.8 F (36.6 C)  97.9 F (36.6 C)  TempSrc:  Oral  Oral  SpO2: 96% 96%  98%  Weight:       I/Os: I/O last 3 completed shifts: In: 1669.7 [I.V.:1569.7; IV Piggyback:100] Out: 710 [Urine:700; Blood:10]  Physical Exam:  General: Patient  is in no apparent distress Lungs: Normal respiratory effort, chest expands symmetrically. GI: The abdomen is soft and nontender  JP drain with serosanguinous drainage Foley: 24 French three-way Foley capped, draining clear yellow urine Ext: lower extremities symmetric  Lab Results: Recent Labs    09/21/19 0128 09/21/19 0802 09/22/19 0323 09/22/19 1832 09/23/19 0513  WBC 9.4  --  10.0  --  7.9  HGB 9.8*   < > 9.7* 10.6* 10.1*  HCT 30.1*   < > 30.3* 33.6* 31.3*   < > = values in this interval not displayed.   Recent Labs    09/21/19 0128 09/22/19 0323 09/23/19 0513  NA 135 136 136  K 3.9 3.6 3.7  CL 102 100 101  CO2 25 21* 23  GLUCOSE 113* 103* 111*  BUN 14 8 8   CREATININE 0.67 0.63 0.63  CALCIUM 8.5* 8.4* 8.4*   No results for input(s): LABPT, INR in  the last 72 hours. No results for input(s): LABURIN in the last 72 hours. Results for orders placed or performed during the hospital encounter of 09/17/19  SARS Coronavirus 2 by RT PCR (hospital order, performed in [facility] hospital lab) Nasopharyngeal Nasopharyngeal Swab     Status: None   Collection Time: 09/17/19 10:35 PM   Specimen: Nasopharyngeal Swab  Result Value Ref Range Status   SARS Coronavirus 2 NEGATIVE NEGATIVE Final    Comment: (NOTE) SARS-CoV-2 target nucleic acids are NOT DETECTED.  The SARS-CoV-2 RNA is generally detectable in upper and lower respiratory specimens during the acute phase of infection. The lowest concentration of SARS-CoV-2 viral copies this assay can detect is 250 copies / mL. A negative result does not preclude SARS-CoV-2 infection and should not be used as the sole basis for treatment or other patient management decisions.  A negative result may occur with improper specimen collection / handling, submission of specimen other than nasopharyngeal swab, presence of viral mutation(s) within the areas targeted by this assay, and inadequate number of viral copies (<250 copies / mL). A negative result must be combined with clinical observations, patient history, and epidemiological information.  Fact Sheet for Patients:   StrictlyIdeas.no  Fact Sheet for Healthcare Providers: BankingDealers.co.za  This test is not yet approved or  cleared by the [place] FDA and has been authorized for detection and/or diagnosis of SARS-CoV-2 by FDA under an Emergency Use Authorization (EUA).  This EUA will remain in effect (meaning this test can be used) for the duration of the COVID-19 declaration under Section 564(b)(1) of the Act, 21 U.S.C. section 360bbb-3(b)(1), unless the authorization is terminated or revoked sooner.  Performed at [facility], [place] [address], [place], [place] [address]   Culture,  Urine     Status: Abnormal   Collection Time: 09/18/19  8:04 PM   Specimen: Urine, Catheterized  Result Value Ref Range Status   Specimen Description URINE, CATHETERIZED  Final   Special Requests   Final    NONE Performed at [facility], [place] [address], [place], [place] [address]    Culture MULTIPLE SPECIES PRESENT, SUGGEST RECOLLECTION (A)  Final   Report Status 09/20/2019 FINAL  Final    Studies/Results: No results found.  Assessment: 69 year old woman with known high-grade T1 urothelial cell carcinoma, CVA x2 and recent bilateral DVTs postop day 1 status post restaging TURBT with intravesical gemcitabine.  Patient doing well and Foley draining clear yellow urine.  Plan: -Okay to remove Foley this morning; recommend bladder scan  at 4 hours; do not wait to replace Foley so that tumor bed resection is not distended -If patient voids well without difficulty then okay to restart heparin ggt today -Continue ASA per primary team -Will await pathology in regards to bladder cancer management   Jacalyn Lefevre, MD Urology 09/23/2019, 8:16 AM

## 2019-09-23 NOTE — Progress Notes (Signed)
  Speech Language Pathology Treatment: Cognitive-Linquistic  Patient Details Name: Lisa [name] MRN: [id number] DOB: Apr 27, 1950 Today's Date: 09/23/2019 Time: 1600-1630 SLP Time Calculation (min) (ACUTE ONLY): 30 min  Assessment / Plan / Recommendation Clinical Impression  Lisa [name] was very motivated and cooperative for therapy. She complains of ongoing difficulty getting her words out and thinking of the right thing. SLP introduced circumlocution and practiced it with her via hierarchical approach. She was provided written hierarchy to aid in practice and use. She was also provided with independent task to practice of 10 words. She did 10 different words with SLP with mod cues required initially for use of hierarchy (1. Function 2. Appearance 3. Who would use it). As she rehearsed, cues were able to fade. She also completed divergent naming with mod-max cues to cease perseveration and surpass just one item per simple category. She did eventually name 3 words per simple category. Some apraxia was noted with many words having altered vowel sounds. Reading comprehension was assessed with pt being unable to read the words aloud, but being able to complete the written direction in 2/3 trials.  Pt was very appreciative for independent task and SLP service. She requested therapy again "as much as possible." Pt would benefit from ongoing ST at next level of care (outpatient or [facility]).   HPI HPI: Lisa [name] is a 69 y.o. female presenting with gross hematuria and symptomatic anemic in the setting of papillary carcinoma of the bladder.  PMH significant for recent ischemic CVA, recent carotid stent placement, HTN, HLD, DM2.  Pt known to this service from recent stroke with Broca's aphasia noted at that time.  MRI 8/5: "1. Mild-to-moderate motion degradation of the axial T1 weightedpostcontrast imaging. 2. 10 mm acute/early subacute infarct within the posteromedial left thalamus. 3. Subcentimeter  suspected late subacute infarct within the subcortical posterior left frontal lobe. 4. A lacunar infarct within the left lentiform nucleus is new as compared to the prior MRI of 08/12/2019, but not acute. 5. Redemonstrated large chronic left MCA territory infarct with associated chronic hemosiderin deposition and/or cortical laminar necrosis. Mild cortical enhancement is also present at this site, which is not unexpected for a late subacute infarct."      SLP Plan  Continue with current plan of care       Recommendations   Home Health or Outpatient ST                SLP Visit Diagnosis: Apraxia (R48.2);Aphasia (R47.01) Plan: Continue with current plan of care                    Neha Waight P. Norelle Runnion, M.S., [place] Pathologist Acute Rehabilitation Services Pager: [place] 09/23/2019, 4:36 PM

## 2019-09-23 NOTE — Progress Notes (Signed)
The chaplain responded to PMT-ML and MD-Brown referral for Pt. completion of Advance Directive.  The Pt. RN-Carolyn is present bedside.  The Pt. declined completing the AD without [name]'s presence.  The chaplain informed the Pt. of the importance of the document.  The Pt. questioned her ability in signing the document appropriately.  The chaplain offered to review and support the Pt. before the notary visit. The Pt was appreciative but declined a notary visit until Thursday morning with [name] present.  The chaplain will F/U on Thursday.

## 2019-09-23 NOTE — Progress Notes (Signed)
This chaplain understands the Pt. sister in law-[name] will visit the Pt. around 10:30 on Thursday.  [name] will ask the RN to page spiritual care for assistance with Pt. AD.

## 2019-09-23 NOTE — Progress Notes (Addendum)
Family Medicine Teaching Service Daily Progress Note Intern Pager: [phone]  Patient name: Lisa [name] Medical record number: [id number] Date of birth: 22-Aug-1950 Age: 69 y.o. Gender: female  Primary Care Provider: Patient, No Pcp Per Consultants: Neurology, Vascular Surgery, Urology Code Status: FULL  Pt Overview and Major Events to Date:  09/17/2019 - admitted, received 1u PRBC 09/18/2019 - Transfused2u PRBC 09/19/2019 -Transfused1u PRBC 09/20/2019 - Transfused2U PRBC 09/20/2019 - Bilateral DVTs on vascular US; heparin gtt started 09/22/2019 -TransUrethralResection ofBladderTumor (TURBT)at [place]; 2 specimen collected  Assessment and Plan: Lisa [name] is a 69 y.ofemale who presented with gross hematuria and blood loss anemia in setting of papillary carcinoma of the bladder. PMH significant for previous ischemic CVA with residual neurological defects, carotid stent placement due to carotid atherosclerosis, HTN, HLD, and type 2 diabetes.  Papillary Carcinoma of the Bladder s/p TURBT: Stable Two specimen collected: right anterior and posterior bladder wall tumor. Post-surgical Hgb 10.6. Hgb this AM 10.1. Patient tolerated procedure well, has no pain or complaints today. Per Urology recommendations, will remove foley and attempt voiding trail. Bladder scan in 4 hours and if patient unable to void will place foley. - Appreciate Urology care and recommendations.  - Start heparin gtt after voiding trail  - Pain control with Tylenol 650 mg and Oxycodone 5 mg IR - Daily CBC's - PT/OT   Acute Bilateral DVTs  Restart heparin gtt today. VSS. Oxygen saturation 95-96% on room air overnight. No complaints of shortness of breath. Not tachycardic. - Restart heparin gtt after able to void - Cardiac monitor - Monitor pulse oximetry  Acute Lacunar Infarct  Multifocal intracranial stenosis Continues to have aphasia, particularly trouble with word finding. Answers yes/no  questions appropriately. Able to partially recall her name today "Deb- something", which is improved from other days. Oriented to place and situation but not time. - on ASA 81 mg. Holding heparin til voiding trial - Neurology has signed off. Recommend switch to DOAC on discharge - Avoid low BP. Goal 263-335 systolic - No indication for TEE as patient will be on long-term anticoagulation for her b/l DVTs  Hypertension: chronic, stable AM BP: 140/64. Home medications: Losartan 25mg , metoprolol 25mg , amlodipine 10mg .Goal systolic 456-256. - Restart amlodipine 2.5 mg - Continue to monitor, adjust as necessary   Carotid atherosclerosishyperlipidemia: stable Patient s/p left TCAR (08/20/2019) for symptomatic left carotid stenosis. Patient was started on aspirin and Plavixpost-procedure. Vascular US this admission showed <50% stenosis of left carotid and 1-39% stenosis of right carotid artery. -Continue atorvastatin 40 mg daily - Currently on ASA 81 mg  - Holding heparin gtt until able to void  Type 2 DM: chronic, stable HbA1c: 6.5%. Home medications: Metformin 500mg .  - holding home metformin  FEN/GI: Regular diet, with nutritional supplements  PPx: Heparin gtt  Disposition: Likely SNF  Subjective:  Patient feels fine this AM after procedure yesterday. States that she believes it went well. Has not been able to eat much because she does not enjoy the food here. No abdominal pain, chest pain or shortness of breath.  Objective: Temp:  [97.7 F (36.5 C)-98.9 F (37.2 C)] 97.8 F (36.6 C) (08/11 0348) Pulse Rate:  [74-80] 78 (08/11 0348) Resp:  [16-24] 20 (08/11 0348) BP: (107-172)/(62-136) 132/62 (08/11 0348) SpO2:  [95 %-100 %] 96 % (08/11 0348) Physical Exam: General: Alert to place and situation, awake, in no acute distress Cardiovascular: RRR Respiratory: CTAB, no wheezing  Abdomen: soft, non-tender in all quadrants.  GU: No bleeding appreciated around  foley insertion  site   Laboratory: Recent Labs  Lab 09/20/19 0228 09/20/19 0228 09/21/19 0128 09/21/19 0802 09/21/19 2108 09/22/19 0323 09/22/19 1832  WBC 9.2  --  9.4  --   --  10.0  --   HGB 7.5*   < > 9.8*   < > 10.1* 9.7* 10.6*  HCT 22.9*   < > 30.1*   < > 30.6* 30.3* 33.6*  PLT 287  --  273  --   --  280  --    < > = values in this interval not displayed.   Recent Labs  Lab 09/20/19 0228 09/21/19 0128 09/22/19 0323  NA 133* 135 136  K 4.9 3.9 3.6  CL 99 102 100  CO2 23 25 21*  BUN 20 14 8   CREATININE 1.17* 0.67 0.63  CALCIUM 8.4* 8.5* 8.4*  PROT 4.9* 5.1* 5.2*  BILITOT 0.9 1.0 0.8  ALKPHOS 93 87 79  ALT 13 14 13   AST 17 16 17   GLUCOSE 127* 113* 103*    Imaging/Diagnostic Tests: None new.  Sharion Settler, DO 09/23/2019, 5:53 AM PGY-1, [place] Intern pager: [phone], text pages welcome

## 2019-09-23 NOTE — Progress Notes (Signed)
Patient ID: Lisa [name], female   DOB: 1950/09/19, 68 y.o.   MRN: [id number]  This NP visited patient at the bedside as a follow up for palliative medicine needs and emotional support.  Patient is out of bed in the chair, alert and oriented and is pleasant and engaged.  Her sister-in-law [name] is at the bedside.  Continued education regarding current medical situation; treatment option decisions, advanced directive decisions and anticipatory care needs. Patient is pleased and relieved that healthcare power of attorney was able to be notarized today reflecting her sister-in-law [name] as her  HPOA.  Continued conversation regarding the next steps as far as transition of care.  Para very much wants to eventually relocate to her family's home in [place].  After conversation she is able to verbalize an understanding that it will be necessary for her to have a transition of care here locally while the logistics of such a move is completed.  Education offered on the likely trajectory and rehabilitation needs for this patient after her recent stroke and bladder surgery/cancer.   Follow-up as of utmost importance.  Discussed with patient the importance of continued conversation with her  family and the medical providers regarding overall plan of care and treatment options,  ensuring decisions are within the context of the patients values and GOCs.  MOST form introduced/education offered to [facility] POA today and blank copy left for review.  Questions and concerns addressed   Discussed with [facility] CMRN  Total time spent on the unit was 35 minutes  Greater than 50% of the time was spent in counseling and coordination of care  Wadie Lessen NP  Palliative Medicine Team Team Phone # [phone] Pager [phone]

## 2019-09-23 NOTE — Progress Notes (Signed)
ANTICOAGULATION CONSULT NOTE - Follow Up Consult  Pharmacy Consult for Heparin Indication: new DVT, presumed PE  No Known Allergies  Patient Measurements: Weight: 80.5 kg (177 lb 7.5 oz) Heparin Dosing Weight:  71 kg  Vital Signs: Temp: 97.7 F (36.5 C) (08/11 1935) Temp Source: Oral (08/11 1935) BP: 147/70 (08/11 1935) Pulse Rate: 75 (08/11 1935)  Labs: Recent Labs    09/21/19 0128 09/21/19 0128 09/21/19 0802 09/21/19 1620 09/21/19 2108 09/22/19 0323 09/22/19 0323 09/22/19 1832 09/23/19 0513  HGB 9.8*   < > 10.6*  --    < > 9.7*   < > 10.6* 10.1*  HCT 30.1*   < > 32.3*  --    < > 30.3*  --  33.6* 31.3*  PLT 273  --   --   --   --  280  --   --  312  HEPARINUNFRC 0.19*  --  0.25* 0.44  --   --   --   --   --   CREATININE 0.67  --   --   --   --  0.63  --   --  0.63   < > = values in this interval not displayed.    Estimated Creatinine Clearance: 71 mL/min (by C-G formula based on SCr of 0.63 mg/dL).   Medications:  Scheduled:  .  stroke: mapping our early stages of recovery book   Does not apply Once  . sodium chloride   Intravenous Once  . amLODipine  2.5 mg Oral Daily  . aspirin  81 mg Oral Daily  . atorvastatin  40 mg Oral Daily  . Chlorhexidine Gluconate Cloth  6 each Topical Daily  . feeding supplement (ENSURE ENLIVE)  237 mL Oral BID BM  . multivitamin with minerals  1 tablet Oral Daily   Infusions:  . sodium chloride 150 mL/hr at 09/23/19 1330  . heparin      Assessment: 69 yo F on heparin for extensive bilateral DVTs and presumed PE.  PMH complicated by bladder cancer and ongoing hematuria resulting in anemia requiring multiple blood transfusions.  Pt is scheduled for [facility] 8[facility].  Heparin has been held due to bleeding post TURBT. There is still some blood output in output but without clots. Ok to resume per urology. She was therapeutic on 1100 units/hr. We will restart without bolus but at a slightly higher rate.   Goal of Therapy:   Heparin level 0.3-0.5 units/ml Monitor platelets by anticoagulation protocol: Yes   Plan:  Restart heparin infusion 1150 units/hr. Heparin level in AM then daily Daily heparin level and CBC  Onnie Boer, PharmD, BCIDP, AAHIVP, CPP Infectious Disease Pharmacist 09/23/2019 8:02 PM

## 2019-09-23 NOTE — Progress Notes (Addendum)
Interim progress note  Spoke with urology resident working overnight, stated she came by to see the patient and appreciated thinning bloody urinary output into the patient's Foley bag without any clots, reported it was draining well.  Thinks it would be safe to restart IV heparin, check CBC in the morning.  If bleeding increases significantly (forming clots, preventing drainage from Foley catheter) can stop the heparin drip.  Patient is a difficult case for antiplatelet/anticoagulation therapy as she recently had a stroke, currently has 2 DVTs, however is also actively bleeding from her bladder.  We will have to keep an eye on her and reevaluate as her condition changes.  Spoke with pharmacy about restarting IV heparin, they will initiate IV heparin dosing without initial heparin bolus.  Milus Banister, [place], PGY-3 09/23/2019 7:43 PM

## 2019-09-23 NOTE — Progress Notes (Signed)
Returned MD page- per Dr. Ouida Sills OK to mobilize and ambulate patient today even given context of DVTs/anticoagulation hold. Will attempt to see patient today as time/schedule allow.   Windell Norfolk, DPT, PN1   Supplemental Physical Therapist [facility]    Pager [phone] Acute Rehab Office [phone]

## 2019-09-23 NOTE — Progress Notes (Signed)
Physical Therapy Treatment Patient Details Name: Lisa [name] MRN: [id number] DOB: 07/28/1950 Today's Date: 09/23/2019    History of Present Illness 69 year old with history of cerebrovascular disease with carotid disease s/p endovascular stent placement on 7/8, invasive papillary carcinoma of bladder, type 2 diabetes, memory impairment presenting with acute symptomatic bleeding due to urinary tract bleeding. AMS CT Head Significant for a lacunar infarct within the left lentiform nucleus; MRI brain revealed a 10 mm acute infarct in the posterior medial left thalamus     PT Comments    MD cleared patient for participation with PT today. See below for mobility/physical assist levels. Able to get OOB to recliner and ambulate in room with general Min guard however definitely much more unsteady dynamically and did need intermittent MinA to maintain balance and safety even with short distance ambulation. Very impulsive and needed frequent reminders and cues to maintain safety. Cognition quite impaired- asked PT probably a good 5 times if the IV belonged to her. Left sitting up in the chair with all needs met, chair alarm active. Would benefit from SNF moving forward as it sounds like family is likely unable to provide appropriate levels of physical assist and supervision at home.     Follow Up Recommendations  SNF;Supervision/Assistance - 24 hour     Equipment Recommendations  None recommended by PT    Recommendations for Other Services       Precautions / Restrictions Precautions Precautions: Fall Restrictions Weight Bearing Restrictions: No    Mobility  Bed Mobility Overal bed mobility: Needs Assistance Bed Mobility: Supine to Sit     Supine to sit: Supervision     General bed mobility comments: S for safety and assist for line management,cues for safety due to impulsivity  Transfers Overall transfer level: Needs assistance Equipment used: None Transfers: Sit to/from  Stand;Stand Pivot Transfers Sit to Stand: Min guard Stand pivot transfers: Min guard       General transfer comment: min guard for safety, assist for line management and frequent VC for safety as she tends to suddently "drop" into chair  Ambulation/Gait Ambulation/Gait assistance: Min guard Gait Distance (Feet): 25 Feet Assistive device: None Gait Pattern/deviations: Step-through pattern;Decreased stride length;Drifts right/left Gait velocity: decr   General Gait Details: easily distractable and with one time LOB requiring MinA to maintain balance, also noted increased unsteadiness on turns as well   Stairs             Wheelchair Mobility    Modified Rankin (Stroke Patients Only)       Balance Overall balance assessment: Needs assistance Sitting-balance support: Feet supported;No upper extremity supported Sitting balance-Leahy Scale: Good     Standing balance support: No upper extremity supported Standing balance-Leahy Scale: Fair Standing balance comment: intermittent unsteadiness and one time MinA to maintain balance dynamically                            Cognition Arousal/Alertness: Awake/alert Behavior During Therapy: Impulsive Overall Cognitive Status: No family/caregiver present to determine baseline cognitive functioning Area of Impairment: Attention;Memory;Following commands;Safety/judgement;Awareness;Problem solving                 Orientation Level: Disoriented to;Situation;Time Current Attention Level: Sustained Memory: Decreased short-term memory;Decreased recall of precautions Following Commands: Follows one step commands consistently;Follows one step commands with increased time Safety/Judgement: Decreased awareness of deficits Awareness: Emergent Problem Solving: Slow processing;Requires verbal cues General Comments: able to prevent her from   perseverating on her husband today, however very distractable and impulsive today.  Flashes of agitation but easy to redirect. Sometimes curses under her breath but for the most part was participatory and cooperative      Exercises      General Comments        Pertinent Vitals/Pain Pain Assessment: No/denies pain Pain Score: 0-No pain Faces Pain Scale: No hurt Pain Intervention(s): Limited activity within patient's tolerance;Monitored during session    Home Living                      Prior Function            PT Goals (current goals can now be found in the care plan section) Acute Rehab PT Goals Patient Stated Goal: to go home PT Goal Formulation: With patient Time For Goal Achievement: 09/14/19 Potential to Achieve Goals: Good Progress towards PT goals: Progressing toward goals    Frequency    Min 3X/week      PT Plan Discharge plan needs to be updated    Co-evaluation              AM-PAC PT "6 Clicks" Mobility   Outcome Measure  Help needed turning from your back to your side while in a flat bed without using bedrails?: A Little Help needed moving from lying on your back to sitting on the side of a flat bed without using bedrails?: A Little Help needed moving to and from a bed to a chair (including a wheelchair)?: A Little Help needed standing up from a chair using your arms (e.g., wheelchair or bedside chair)?: A Little Help needed to walk in hospital room?: A Little Help needed climbing 3-5 steps with a railing? : A Lot 6 Click Score: 17    End of Session Equipment Utilized During Treatment: Gait belt Activity Tolerance: Patient tolerated treatment well Patient left: in chair;with call bell/phone within reach;with chair alarm set   PT Visit Diagnosis: Difficulty in walking, not elsewhere classified (R26.2);Other symptoms and signs involving the nervous system (R29.898)     Time: 8403-7543 PT Time Calculation (min) (ACUTE ONLY): 23 min  Charges:  $Gait Training: 8-22 mins $Therapeutic Activity: 8-22 mins                      Windell Norfolk, DPT, PN1   Supplemental Physical Therapist [place]    Pager [phone] Acute Rehab Office [phone]

## 2019-09-23 NOTE — TOC Progression Note (Addendum)
Transition of Care [facility]) - Progression Note    Patient Details  Name: Lisa [name] MRN: [id number] Date of Birth: [birth year]  Transition of Care [facility]) CM/SW Contact  [name], RN Phone Number: 09/23/2019, 1:27 PM  Clinical Narrative:    Recommendations per therapy have changed to SNF rehab. CM has spoken to the patient and her sister in law (who she wants making her decisions) and they are in agreement.  Pt faxed out in [facility] area per sister in laws request.  Will being insurance authorization closer to time of d/c and follow.   Expected Discharge Plan: [place] Barriers to Discharge: Continued Medical Work up  Expected Discharge Plan and Services Expected Discharge Plan: [place] arrangements for the past 2 months: [place]                                       Social Determinants of Health (SDOH) Interventions    Readmission Risk Interventions No flowsheet data found.

## 2019-09-23 NOTE — NC FL2 (Signed)
[place] MEDICAID FL2 LEVEL OF CARE SCREENING TOOL     IDENTIFICATION  Patient Name: Lisa [name] Birthdate: February 26, 1950 Sex: female Admission Date (Current Location): 09/17/2019  [facility] and [place] Number:  Herbalist and Address:  [facility], [place] [address] [place], [place] [address]      Provider Number: [id number]  Attending Physician Name and Address:  Martyn Malay, MD  Relative Name and Phone Number:       Current Level of Care: Hospital Recommended Level of Care: [place] Prior Approval Number:    Date Approved/Denied:   PASRR Number: [id number]  Discharge Plan: SNF    Current Diagnoses: Patient Active Problem List   Diagnosis Date Noted  . Aphasia   . Palliative care by specialist   . DNR (do not resuscitate) discussion   . Primary papillary carcinoma of bladder ([place]) 09/17/2019  . Lacunar infarct, acute ([place]) 09/17/2019  . Acute blood loss anemia 09/17/2019  . Pressure injury of skin 09/07/2019  . Gross hematuria 09/01/2019  . Hematuria 08/31/2019  . 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 ([place]) 08/20/2019  . Stenosis of carotid artery   . Abnormality of aortic valve   . DM (diabetes mellitus), type 2, uncontrolled w/neurologic complication ([place])   . Hypercholesteremia   . CVA (cerebral vascular accident) ([place]) 08/12/2019  . Acute ischemic stroke ([place])   . Hypertension     Orientation RESPIRATION BLADDER Height & Weight     Self, Time, Situation, Place  Normal Incontinent Weight: 177 lb 7.5 oz (80.5 kg) Height:     BEHAVIORAL SYMPTOMS/MOOD NEUROLOGICAL BOWEL NUTRITION STATUS      Incontinent Diet (heart healthy)  AMBULATORY STATUS COMMUNICATION OF NEEDS Skin   Limited Assist Verbally Skin abrasions, PU Stage and Appropriate Care   PU Stage 2 Dressing: No Dressing (right toe)                   Personal Care Assistance Level of Assistance  Bathing,  Feeding, Dressing Bathing Assistance: Limited assistance Feeding assistance: Independent Dressing Assistance: Limited assistance     Functional Limitations Info  Speech     Speech Info: Impaired (expressive aphasia)    SPECIAL CARE FACTORS FREQUENCY  PT (By licensed PT), OT (By licensed OT)     PT Frequency: 5x/wk OT Frequency: 5x/wk            Contractures Contractures Info: Not present    Additional Factors Info  Code Status, Allergies Code Status Info: Full Allergies Info: NKA           Current Medications (09/23/2019):  This is the current hospital active medication list Current Facility-Administered Medications  Medication Dose Route Frequency Provider Last Rate Last Admin  .  stroke: mapping our early stages of recovery book   Does not apply Once Milus Banister C, DO      . 0.9 %  sodium chloride infusion (Manually program via Guardrails IV Fluids)   Intravenous Once Simmons-Robinson, [place], MD 10 mL/hr at 09/20/19 1420 Restarted at 09/20/19 1420  . 0.9 %  sodium chloride infusion   Intravenous Continuous Martyn Malay, MD 50 mL/hr at 09/22/19 1758 New Bag at 09/22/19 1758  . acetaminophen (TYLENOL) tablet 650 mg  650 mg Oral Q6H PRN Daisy Floro, DO   650 mg at 09/18/19 1819   Or  . acetaminophen (TYLENOL) suppository 650 mg  650 mg Rectal Q6H PRN Milus Banister  C, DO      . amLODipine (NORVASC) tablet 2.5 mg  2.5 mg Oral Daily Martyn Malay, MD   2.5 mg at 09/23/19 0915  . aspirin chewable tablet 81 mg  81 mg Oral Daily Martyn Malay, MD   81 mg at 09/23/19 0915  . atorvastatin (LIPITOR) tablet 40 mg  40 mg Oral Daily Martyn Malay, MD   40 mg at 09/23/19 0915  . bisacodyl (DULCOLAX) suppository 10 mg  10 mg Rectal Daily PRN Daisy Floro, DO      . Chlorhexidine Gluconate Cloth 2 % PADS 6 each  6 each Topical Daily Martyn Malay, MD   6 each at 09/20/19 1000  . feeding supplement (ENSURE ENLIVE) (ENSURE ENLIVE) liquid 237 mL  237 mL  Oral BID BM Martyn Malay, MD      . multivitamin with minerals tablet 1 tablet  1 tablet Oral Daily Martyn Malay, MD   1 tablet at 09/23/19 0914  . opium-belladonna (B&O) suppository 16.2-60mg   1 suppository Rectal Q8H PRN Delight Hoh, MD   1 suppository at 09/18/19 2307  . oxyCODONE (Oxy IR/ROXICODONE) immediate release tablet 5 mg  5 mg Oral Q4H PRN Simmons-Robinson, Makiera, MD   5 mg at 09/18/19 2250  . polyethylene glycol (MIRALAX / GLYCOLAX) packet 17 g  17 g Oral Daily PRN Daisy Floro, DO         Discharge Medications: Please see discharge summary for a list of discharge medications.  Relevant Imaging Results:  Relevant Lab Results:   Additional Information SS#: [id number]  Geralynn Ochs, LCSW

## 2019-09-23 NOTE — Progress Notes (Signed)
After examining patient with Dr. Nita Sells, page was received in conversation with urology, Dr. Claudia Desanctis. Dr. Claudia Desanctis recommends that catheter be removed and patient undergo voiding trial. Patient also to have bladder scan 4 hours after removal of catheter. Dr. March Rummage also recommends holding restart of heparin until patient has been able to void. Orders placed and discussed with RN to proceed with removal of catheter.   Eulis Foster, MD  [facility] Service, PGY-2  [place] Intern Pager [phone]

## 2019-09-24 DIAGNOSIS — R310 Gross hematuria: Secondary | ICD-10-CM | POA: Diagnosis not present

## 2019-09-24 DIAGNOSIS — I10 Essential (primary) hypertension: Secondary | ICD-10-CM

## 2019-09-24 DIAGNOSIS — D649 Anemia, unspecified: Secondary | ICD-10-CM | POA: Diagnosis present

## 2019-09-24 DIAGNOSIS — Z515 Encounter for palliative care: Secondary | ICD-10-CM | POA: Diagnosis not present

## 2019-09-24 DIAGNOSIS — I63512 Cerebral infarction due to unspecified occlusion or stenosis of left middle cerebral artery: Secondary | ICD-10-CM | POA: Diagnosis not present

## 2019-09-24 DIAGNOSIS — D62 Acute posthemorrhagic anemia: Secondary | ICD-10-CM | POA: Diagnosis not present

## 2019-09-24 DIAGNOSIS — C679 Malignant neoplasm of bladder, unspecified: Principal | ICD-10-CM

## 2019-09-24 DIAGNOSIS — E1149 Type 2 diabetes mellitus with other diabetic neurological complication: Secondary | ICD-10-CM | POA: Diagnosis not present

## 2019-09-24 DIAGNOSIS — R4701 Aphasia: Secondary | ICD-10-CM | POA: Diagnosis not present

## 2019-09-24 DIAGNOSIS — I6381 Other cerebral infarction due to occlusion or stenosis of small artery: Secondary | ICD-10-CM | POA: Diagnosis not present

## 2019-09-24 DIAGNOSIS — R531 Weakness: Secondary | ICD-10-CM

## 2019-09-24 LAB — CBC
HCT: 30.7 % — ABNORMAL LOW (ref 36.0–46.0)
Hemoglobin: 9.7 g/dL — ABNORMAL LOW (ref 12.0–15.0)
MCH: 28 pg (ref 26.0–34.0)
MCHC: 31.6 g/dL (ref 30.0–36.0)
MCV: 88.5 fL (ref 80.0–100.0)
Platelets: 306 10*3/uL (ref 150–400)
RBC: 3.47 MIL/uL — ABNORMAL LOW (ref 3.87–5.11)
RDW: 17.7 % — ABNORMAL HIGH (ref 11.5–15.5)
WBC: 7 10*3/uL (ref 4.0–10.5)
nRBC: 0 % (ref 0.0–0.2)

## 2019-09-24 LAB — GLUCOSE, CAPILLARY
Glucose-Capillary: 109 mg/dL — ABNORMAL HIGH (ref 70–99)
Glucose-Capillary: 168 mg/dL — ABNORMAL HIGH (ref 70–99)
Glucose-Capillary: 95 mg/dL (ref 70–99)
Glucose-Capillary: 127 mg/dL — ABNORMAL HIGH (ref 70–99)

## 2019-09-24 LAB — HEPARIN LEVEL (UNFRACTIONATED)
Heparin Unfractionated: 0.26 IU/mL — ABNORMAL LOW (ref 0.30–0.70)
Heparin Unfractionated: 0.41 IU/mL (ref 0.30–0.70)

## 2019-09-24 MED ORDER — AMLODIPINE BESYLATE 5 MG PO TABS
5.0000 mg | ORAL_TABLET | Freq: Every day | ORAL | Status: DC
  Administered 2019-09-25 – 2019-09-26 (×2): 5 mg via ORAL
  Filled 2019-09-24 (×2): qty 1

## 2019-09-24 MED ORDER — ENSURE MAX PROTEIN PO LIQD
11.0000 [oz_av] | Freq: Every day | ORAL | Status: DC
  Administered 2019-09-24: 11 [oz_av] via ORAL
  Filled 2019-09-24 (×7): qty 330

## 2019-09-24 NOTE — Progress Notes (Signed)
[place] for IV heparin Indication: new DVT, presumed PE  Assessment: 69 yo female with complex history including bladder cancer plus hematuria and anemia s/p TURBT procedure 8/10. Pharmacy consulted to restart heparin for treatment of BLLE DVTs with presumed PE.   Repeat heparin level at 1400 is 0.41 units/ml which is therapeutic. Patient has improved hematuria and urine appears slightly red in color. Ok to continue per physician and urology. CBC stable.   Goal of Therapy:  Will target heparin level 0.3-0.5 for increased risk for bleed  Monitor platelets by anticoagulation protocol: Yes   Plan:  Continue IV heparin 1250 units/hr. Daily heparin level and CBC. Monitor for new bleeding and worsening hematuria Monitor transition to oral [place], PharmD, [facility] Resident [phone] 09/24/2019 2:38 PM

## 2019-09-24 NOTE — Progress Notes (Addendum)
Family Medicine Teaching Service Daily Progress Note Intern Pager: [phone]  Patient name: Lisa [name] Medical record number: [id number] Date of birth: Sep 22, 1950 Age: 69 y.o. Gender: female  Primary Care Provider: Patient, No Pcp Per Consultants: Neurology, Vascular Surgery, Urology  Code Status: FULL  Pt Overview and Major Events to Date:  09/17/2019 - admitted, received 1u PRBC 09/18/2019 - Transfused2u PRBC 09/19/2019 -Transfused1u PRBC 09/20/2019 - Transfused2U PRBC 09/20/2019 - Bilateral DVTs on vascular US; heparin gtt started 09/22/2019 -TransUrethralResection ofBladderTumor (TURBT)at [place] with intravesical gemcitabine; 2 specimen collected  Assessment and Plan: Verdelle [name] is a 31 y.ofemale who presented with gross hematuria and blood loss anemia in setting of papillary carcinoma of the bladder. PMH significant for previous ischemic CVA with residual neurological defects, carotid stent placement due to carotid atherosclerosis, HTN, HLD, and type 2 diabetes.  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 Unsuccessful voiding trial yesterday and foley inserted. Heparin gtt started back last night. Hgb this AM 9.7.  - Transfusion threshold 8 - Pathology returned as high grade T1 urothelial cell carcinoma  - Urology recommendations: outpatient follow up. Continue foley for 5 days. Today would be day 2/5  - Continue heparin gtt, monitor hematuria and Hgb closely - Pain control with Tylenol 650 mg and Oxycodone 5 mg IR - Daily CBC's - PT/OT   Acute Bilateral DVTs On heparin gtt. VSS. Oxygen saturation 98-100% on room air overnight. No complaints of shortness of breath. Not tachycardic. - Continue heparin gtt - Cardiac monitor - Monitor pulse oximetry  Acute Lacunar Infarct  Multifocal intracranial stenosis Continues to have aphasia, particularly trouble with word finding. Appears to have made some progress with speech training  yesterday.  - on ASA 81 mg.  - Heparin gtt - Neurology has signed off. Recommend switch to DOAC on discharge - Avoid low BP. Goal 627-035 systolic - No indication for TEE as patient will be on long-term anticoagulation for her b/l DVTs - Continue PT/OT/Speech Therapy  - CM working to find SNF placement  Hypertension: chronic, stable AM BP: 169/76. Home medications: Losartan 25mg , metoprolol 25mg , amlodipine 10mg .Goal systolic 009-381. -Continue 2.5 mg, consider increasing to 5mg  - Vital signs per floor protocl   Uterine mass on CT Abd/Pelv: stable Patient with uterine mass at fundus measuring approximately 8 x 12 x 11 cm on CT scan 8/5. Not able to be visualized on pelvic U/S. Will likely need TVUS once more stable/outpatient follow up. - TVUS likely outpatient one more stable - Outpatient follow up  Carotid atherosclerosishyperlipidemia: stable Patient s/p left TCAR (08/20/2019) for symptomatic left carotid stenosis. Patient was started on aspirin and Plavixpost-procedure. Vascular USthis admissionshowed<50% stenosis of left carotid and 1-39% stenosis of right carotid artery. -Continue atorvastatin 40 mg daily - Currently on ASA 81 mg  - Heparin gtt  Type 2 DM: chronic, stable HbA1c: 6.5%. Home medications: Metformin 500mg .  - holding home metformin  FEN/GI: Regular diet, with nutritional supplements  PPx: Heparin gtt  Disposition: SNF   Subjective:  Patient is tired this morning. No complaints, would like to continue to rest.   Objective: Temp:  [97.7 F (36.5 C)-98.3 F (36.8 C)] 98.1 F (36.7 C) (08/12 0333) Pulse Rate:  [75-81] 81 (08/12 0333) Resp:  [16-25] 18 (08/12 0333) BP: (128-158)/(61-72) 158/70 (08/12 0333) SpO2:  [98 %-100 %] 98 % (08/12 0333) Physical Exam: General: No distress, resting comfortably in bed. Foley with 400cc light orange/red tinted urine, picture in media Cardiovascular: RRR Respiratory: CTAB,  no rhonchi, wheezing Abdomen:  soft, non-tender in all quadrants Extremities: no edema, 2+ DP b/l GU: no blood visualized on external exam   Laboratory: Recent Labs  Lab 09/22/19 0323 09/22/19 0323 09/22/19 1832 09/23/19 0513 09/24/19 0447  WBC 10.0  --   --  7.9 7.0  HGB 9.7*   < > 10.6* 10.1* 9.7*  HCT 30.3*   < > 33.6* 31.3* 30.7*  PLT 280  --   --  312 306   < > = values in this interval not displayed.   Recent Labs  Lab 09/20/19 0228 09/20/19 0228 09/21/19 0128 09/22/19 0323 09/23/19 0513  NA 133*   < > 135 136 136  K 4.9   < > 3.9 3.6 3.7  CL 99   < > 102 100 101  CO2 23   < > 25 21* 23  BUN 20   < > 14 8 8   CREATININE 1.17*   < > 0.67 0.63 0.63  CALCIUM 8.4*   < > 8.5* 8.4* 8.4*  PROT 4.9*  --  5.1* 5.2*  --   BILITOT 0.9  --  1.0 0.8  --   ALKPHOS 93  --  87 79  --   ALT 13  --  14 13  --   AST 17  --  16 17  --   GLUCOSE 127*   < > 113* 103* 111*   < > = values in this interval not displayed.   Imaging/Diagnostic Tests: None new.  Sharion Settler, DO 09/24/2019, 6:03 AM PGY-1, [place] Intern pager: [phone], text pages welcome

## 2019-09-24 NOTE — Progress Notes (Signed)
Occupational Therapy Treatment Patient Details Name: Lisa [name] MRN: [id number] DOB: 1950-12-25 Today's Date: 09/24/2019    History of present illness 69 year old with history of cerebrovascular disease with carotid disease s/p endovascular stent placement on 7/8, invasive papillary carcinoma of bladder, type 2 diabetes, memory impairment presenting with acute symptomatic bleeding due to urinary tract bleeding. AMS CT Head Significant for a lacunar infarct within the left lentiform nucleus; MRI brain revealed a 10 mm acute infarct in the posterior medial left thalamus    OT comments  Patient continues to make steady progress towards goals in skilled OT session. Patient's session encompassed ADLs, functional mobility, and attempts to aid in expressive language to date. Pt is making progress with regard to sequencing basic ADLs, however demonstrates significant difficulty in identifying items and unable to state her own name throughout the session. Pt able to recite 2/3 of the ABC song if therapist initiated, however despite assist and prompting to complete the final third, pt would get frustrated and say "I need to keep practicing" instead of continuing. Discharge remains appropriate at this time, will continue to follow acutely.    Follow Up Recommendations  SNF;Home health OT;Supervision/Assistance - 24 hour (Pending progress)    Equipment Recommendations  None recommended by OT    Recommendations for Other Services      Precautions / Restrictions Precautions Precautions: Fall Restrictions Weight Bearing Restrictions: No       Mobility Bed Mobility Overal bed mobility: Needs Assistance Bed Mobility: Supine to Sit     Supine to sit: Min guard;HOB elevated     General bed mobility comments: +rail, increased time, min guard for safety  Transfers Overall transfer level: Needs assistance Equipment used: None Transfers: Sit to/from Stand Sit to Stand: Min assist;Min  guard Stand pivot transfers: Min assist;Min guard       General transfer comment: for safety    Balance Overall balance assessment: Needs assistance Sitting-balance support: Feet supported;No upper extremity supported Sitting balance-Leahy Scale: Good     Standing balance support: No upper extremity supported;During functional activity Standing balance-Leahy Scale: Fair Standing balance comment: static stand without UE support, unsteady gait/LOB                           ADL either performed or assessed with clinical judgement   ADL Overall ADL's : Needs assistance/impaired     Grooming: Wash/dry face;Wash/dry hands;Oral care;Minimal assistance Grooming Details (indicate cue type and reason): Min A in standing for sequencing of task, but improved cognition noted                 Toilet Transfer: Ambulation;Minimal assistance Toilet Transfer Details (indicate cue type and reason): Simulated with recliner         Functional mobility during ADLs: Minimal assistance;Cueing for safety;Cueing for sequencing General ADL Comments: Pt participatory, however adamantly refused walker in session kept stating "see 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      Cognition Arousal/Alertness: Awake/alert Behavior During Therapy: Impulsive Overall Cognitive Status: No family/caregiver present to determine baseline cognitive functioning Area of Impairment: Attention;Memory;Following commands;Safety/judgement;Awareness;Problem solving;Orientation                 Orientation Level: Disoriented to;Situation;Time Current Attention Level: Sustained Memory: Decreased short-term memory;Decreased recall of precautions Following Commands: Follows one step commands consistently;Follows one step commands  with increased time Safety/Judgement: Decreased awareness of safety;Decreased awareness of deficits Awareness: Emergent Problem Solving:  Slow processing;Requires verbal cues;Difficulty sequencing General Comments: tangential speech, expressive difficulties        Exercises     Shoulder Instructions       General Comments      Pertinent Vitals/ Pain       Pain Assessment: No/denies pain  Home Living                                          Prior Functioning/Environment              Frequency  Min 2X/week        Progress Toward Goals  OT Goals(current goals can now be found in the care plan section)  Progress towards OT goals: Progressing toward goals  Acute Rehab OT Goals Patient Stated Goal: to keep practicing OT Goal Formulation: With patient Time For Goal Achievement: 10/04/19 Potential to Achieve Goals: Good  Plan Discharge plan remains appropriate    Co-evaluation                 AM-PAC OT "6 Clicks" Daily Activity     Outcome Measure   Help from another person eating meals?: A Little Help from another person taking care of personal grooming?: A Little Help from another person toileting, which includes using toliet, bedpan, or urinal?: A Lot Help from another person bathing (including washing, rinsing, drying)?: A Lot Help from another person to put on and taking off regular upper body clothing?: A Little Help from another person to put on and taking off regular lower body clothing?: A Lot 6 Click Score: 15    End of Session Equipment Utilized During Treatment: Gait belt  OT Visit Diagnosis: Unsteadiness on feet (R26.81);Muscle weakness (generalized) (M62.81);Other symptoms and signs involving the nervous system (R29.898);Other symptoms and signs involving cognitive function   Activity Tolerance Patient tolerated treatment well   Patient Left in chair;with call bell/phone within reach;with chair alarm set   Nurse Communication Mobility status        Time: 9622-2979 OT Time Calculation (min): 17 min  Charges: OT General Charges $OT Visit: 1  Visit OT Evaluation $OT Eval Low Complexity: 1 Low OT Treatments $Self Care/Home Management : 8-22 mins  Corinne Ports E. Pierce, [facility] [phone] [place] 09/24/2019, 4:06 PM

## 2019-09-24 NOTE — Progress Notes (Signed)
Urology Inpatient Progress Report  Gross hematuria [R31.0] Acute lacunar stroke ([place]) [I63.81] Lacunar infarct, acute ([place]) [I63.81] Symptomatic anemia [D64.9]  Procedure(s): TRANSURETHRAL RESECTION OF BLADDER TUMOR WITH INTRAVESICAL GEMCITABINE  2 Days Post-Op   Intv/Subj: No acute events overnight.  Patient is without complaint.  Heparin ggt restarted last night.   Principal Problem:   Lacunar infarct, acute ([place]) Active Problems:   CVA (cerebral vascular accident) ([place])   Acute ischemic stroke ([place])   Hypertension   DM (diabetes mellitus), type 2, uncontrolled w/neurologic complication ([place])   Stenosis of carotid artery   Hematuria   Primary papillary carcinoma of bladder ([place])   Acute blood loss anemia   Aphasia   Palliative care by specialist   DNR (do not resuscitate) discussion  Current Facility-Administered Medications  Medication Dose Route Frequency Provider Last Rate Last Admin  .  stroke: mapping our early stages of recovery book   Does not apply Once Milus Banister C, DO      . 0.9 %  sodium chloride infusion (Manually program via Guardrails IV Fluids)   Intravenous Once Simmons-Robinson, Makiera, MD 10 mL/hr at 09/20/19 1420 Restarted at 09/20/19 1420  . 0.9 %  sodium chloride infusion   Intravenous Continuous Daisy Floro, DO 150 mL/hr at 09/24/19 0332 New Bag at 09/24/19 0332  . acetaminophen (TYLENOL) tablet 650 mg  650 mg Oral Q6H PRN Milus Banister C, DO   650 mg at 09/18/19 1819   Or  . acetaminophen (TYLENOL) suppository 650 mg  650 mg Rectal Q6H PRN Daisy Floro, DO      . amLODipine (NORVASC) tablet 2.5 mg  2.5 mg Oral Daily Martyn Malay, MD   2.5 mg at 09/23/19 0915  . aspirin chewable tablet 81 mg  81 mg Oral Daily Martyn Malay, MD   81 mg at 09/23/19 0915  . atorvastatin (LIPITOR) tablet 40 mg  40 mg Oral Daily Martyn Malay, MD   40 mg at 09/23/19 0915  . bisacodyl (DULCOLAX) suppository 10 mg  10 mg Rectal Daily PRN  Daisy Floro, DO      . Chlorhexidine Gluconate Cloth 2 % PADS 6 each  6 each Topical Daily Martyn Malay, MD   6 each at 09/20/19 1000  . feeding supplement (ENSURE ENLIVE) (ENSURE ENLIVE) liquid 237 mL  237 mL Oral BID BM Martyn Malay, MD      . heparin ADULT infusion 100 units/mL (25000 units/276mL sodium chloride 0.45%)  1,250 Units/hr Intravenous Continuous Zenia Resides, MD 12.5 mL/hr at 09/24/19 0630 1,250 Units/hr at 09/24/19 0630  . multivitamin with minerals tablet 1 tablet  1 tablet Oral Daily Martyn Malay, MD   1 tablet at 09/23/19 0914  . opium-belladonna (B&O) suppository 16.2-60mg   1 suppository Rectal Q8H PRN Delight Hoh, MD   1 suppository at 09/18/19 2307  . oxyCODONE (Oxy IR/ROXICODONE) immediate release tablet 5 mg  5 mg Oral Q4H PRN Simmons-Robinson, Makiera, MD   5 mg at 09/18/19 2250  . polyethylene glycol (MIRALAX / GLYCOLAX) packet 17 g  17 g Oral Daily PRN Milus Banister C, DO         Objective: Vital: Vitals:   09/23/19 1628 09/23/19 1935 09/23/19 2343 09/24/19 0333  BP: 128/61 (!) 147/70 (!) 147/62 (!) 158/70  Pulse: 79 75 79 81  Resp: 20 20 18 18   Temp: 97.9 F (36.6 C) 97.7 F (36.5 C) 98.3 F (36.8 C) 98.1 F (36.7 C)  TempSrc: Oral Oral Oral Oral  SpO2: 100% 100% 98% 98%  Weight:       I/Os: I/O last 3 completed shifts: In: 2554.4 [P.O.:120; I.V.:2434.4] Out: 3400 [Urine:3400]  Physical Exam:  General: Patient is in no apparent distress Lungs: Normal respiratory effort, chest expands symmetrically. GI: The abdomen is soft and nontender  Foley: 16Fr foley draining clear yellow urine without blood or clot Ext: lower extremities symmetric  Lab Results: Recent Labs    09/22/19 0323 09/22/19 0323 09/22/19 1832 09/23/19 0513 09/24/19 0447  WBC 10.0  --   --  7.9 7.0  HGB 9.7*   < > 10.6* 10.1* 9.7*  HCT 30.3*   < > 33.6* 31.3* 30.7*   < > = values in this interval not displayed.   Recent Labs    09/22/19 0323  09/23/19 0513  NA 136 136  K 3.6 3.7  CL 100 101  CO2 21* 23  GLUCOSE 103* 111*  BUN 8 8  CREATININE 0.63 0.63  CALCIUM 8.4* 8.4*   No results for input(s): LABPT, INR in the last 72 hours. No results for input(s): LABURIN in the last 72 hours. Results for orders placed or performed during the hospital encounter of 09/17/19  SARS Coronavirus 2 by RT PCR (hospital order, performed in [facility] hospital lab) Nasopharyngeal Nasopharyngeal Swab     Status: None   Collection Time: 09/17/19 10:35 PM   Specimen: Nasopharyngeal Swab  Result Value Ref Range Status   SARS Coronavirus 2 NEGATIVE NEGATIVE Final    Comment: (NOTE) SARS-CoV-2 target nucleic acids are NOT DETECTED.  The SARS-CoV-2 RNA is generally detectable in upper and lower respiratory specimens during the acute phase of infection. The lowest concentration of SARS-CoV-2 viral copies this assay can detect is 250 copies / mL. A negative result does not preclude SARS-CoV-2 infection and should not be used as the sole basis for treatment or other patient management decisions.  A negative result may occur with improper specimen collection / handling, submission of specimen other than nasopharyngeal swab, presence of viral mutation(s) within the areas targeted by this assay, and inadequate number of viral copies (<250 copies / mL). A negative result must be combined with clinical observations, patient history, and epidemiological information.  Fact Sheet for Patients:   StrictlyIdeas.no  Fact Sheet for Healthcare Providers: BankingDealers.co.za  This test is not yet approved or  cleared by the [place] FDA and has been authorized for detection and/or diagnosis of SARS-CoV-2 by FDA under an Emergency Use Authorization (EUA).  This EUA will remain in effect (meaning this test can be used) for the duration of the COVID-19 declaration under Section 564(b)(1) of the Act, 21  U.S.C. section 360bbb-3(b)(1), unless the authorization is terminated or revoked sooner.  Performed at [facility], [place] [address], [place], [place] [address]   Culture, Urine     Status: Abnormal   Collection Time: 09/18/19  8:04 PM   Specimen: Urine, Catheterized  Result Value Ref Range Status   Specimen Description URINE, CATHETERIZED  Final   Special Requests   Final    NONE Performed at [facility], [place] [address] [place], [place] [address]    Culture MULTIPLE SPECIES PRESENT, SUGGEST RECOLLECTION (A)  Final   Report Status 09/20/2019 FINAL  Final    Studies/Results: No results found.  Assessment: 69 year old woman with known high-grade T1 urothelial cell carcinoma, CVA x2 and recent bilateral DVTs postop day 2 status post restaging TURBT  with intravesical gemcitabine.  Patient doing well and Foley draining clear yellow urine.  Plan: -patient did not void yesterday after foley removed and it was replaced; initial urine return red but has since cleared to yellow urine  -pathology returned as high grade T1 urothelial cell carcinoma (will arrange outpatient follow up) -continue foley for 5 days to allow bladder tumor resection to heal especially while on blood thinners  Jacalyn Lefevre, MD Urology 09/24/2019, 7:30 AM

## 2019-09-24 NOTE — Progress Notes (Signed)
[place] for Heparin Indication: new DVT, presumed PE  No Known Allergies  Patient Measurements: Weight: 80.5 kg (177 lb 7.5 oz) Heparin Dosing Weight:  71 kg  Vital Signs: Temp: 98.1 F (36.7 C) (08/12 0333) Temp Source: Oral (08/12 0333) BP: 158/70 (08/12 0333) Pulse Rate: 81 (08/12 0333)  Labs: Recent Labs    09/21/19 0802 09/21/19 1620 09/21/19 2108 09/22/19 0323 09/22/19 0323 09/22/19 1832 09/22/19 1832 09/23/19 0513 09/24/19 0447  HGB 10.6*  --    < > 9.7*   < > 10.6*   < > 10.1* 9.7*  HCT 32.3*  --    < > 30.3*   < > 33.6*  --  31.3* 30.7*  PLT  --   --   --  280  --   --   --  312 306  HEPARINUNFRC 0.25* 0.44  --   --   --   --   --   --  0.26*  CREATININE  --   --   --  0.63  --   --   --  0.63  --    < > = values in this interval not displayed.    Estimated Creatinine Clearance: 71 mL/min (by C-G formula based on SCr of 0.63 mg/dL).  Assessment: 69 y.o. female with DVT/PE for heparin.  RN reports that Foley bag drainage is improving  Goal of Therapy:  Heparin level 0.3-0.5 units/ml Monitor platelets by anticoagulation protocol: Yes   Plan:  Increase Heparin 1250 units/hr Check heparin level in 8 hours.   Phillis Knack, PharmD, BCPS

## 2019-09-24 NOTE — Progress Notes (Signed)
Physical Therapy Treatment Patient Details Name: Lisa [name] MRN: [id number] DOB: [birth year] Today's Date: 09/24/2019    History of Present Illness 69 year old with history of cerebrovascular disease with carotid disease s/p endovascular stent placement on 7/8, invasive papillary carcinoma of bladder, type 2 diabetes, memory impairment presenting with acute symptomatic bleeding due to urinary tract bleeding. AMS CT Head Significant for a lacunar infarct within the left lentiform nucleus; MRI brain revealed a 10 mm acute infarct in the posterior medial left thalamus     PT Comments    Pt received in bed, agreeable to participation in therapy. She required min guard assist bed mobility, min guard assist transfers, and min guard assist ambulation 40' without AD. Pt with unsteady gait. Fatigues quickly. Pt in recliner at end of session.    Follow Up Recommendations  SNF;Supervision/Assistance - 24 hour     Equipment Recommendations  None recommended by PT    Recommendations for Other Services       Precautions / Restrictions Precautions Precautions: Fall    Mobility  Bed Mobility Overal bed mobility: Needs Assistance Bed Mobility: Supine to Sit     Supine to sit: Min guard;HOB elevated     General bed mobility comments: +rail, increased time, min guard for safety  Transfers Overall transfer level: Needs assistance Equipment used: None Transfers: Sit to/from Stand Sit to Stand: Min guard Stand pivot transfers: Min guard       General transfer comment: min guard for safety  Ambulation/Gait Ambulation/Gait assistance: Min guard Gait Distance (Feet): 40 Feet Assistive device: None Gait Pattern/deviations: Step-through pattern;Decreased stride length;Drifts right/left Gait velocity: decreased Gait velocity interpretation: <1.31 ft/sec, indicative of household ambulator General Gait Details: unsteady gait, easily distracted, cues for sequencing/safety   Stairs              Wheelchair Mobility    Modified Rankin (Stroke Patients Only) Modified Rankin (Stroke Patients Only) Pre-Morbid Rankin Score: Moderately severe disability Modified Rankin: Moderately severe disability     Balance Overall balance assessment: Needs assistance Sitting-balance support: Feet supported;No upper extremity supported Sitting balance-Leahy Scale: Good     Standing balance support: No upper extremity supported;During functional activity Standing balance-Leahy Scale: Fair Standing balance comment: static stand without UE support, unsteady gait/LOB                            Cognition Arousal/Alertness: Awake/alert Behavior During Therapy: Impulsive Overall Cognitive Status: No family/caregiver present to determine baseline cognitive functioning Area of Impairment: Attention;Memory;Following commands;Safety/judgement;Awareness;Problem solving;Orientation                 Orientation Level: Disoriented to;Situation;Time Current Attention Level: Sustained Memory: Decreased short-term memory;Decreased recall of precautions Following Commands: Follows one step commands consistently;Follows one step commands with increased time Safety/Judgement: Decreased awareness of safety;Decreased awareness of deficits Awareness: Emergent Problem Solving: Slow processing;Requires verbal cues;Difficulty sequencing General Comments: tangential speech, expressive difficulties      Exercises      General Comments        Pertinent Vitals/Pain Pain Assessment: No/denies pain    Home Living                      Prior Function            PT Goals (current goals can now be found in the care plan section) Acute Rehab PT Goals Patient Stated Goal: to rest Time For Goal Achievement: 09/28/19  Potential to Achieve Goals: Good Progress towards PT goals: Progressing toward goals    Frequency    Min 3X/week      PT Plan Current plan  remains appropriate    Co-evaluation              AM-PAC PT "6 Clicks" Mobility   Outcome Measure  Help needed turning from your back to your side while in a flat bed without using bedrails?: A Little Help needed moving from lying on your back to sitting on the side of a flat bed without using bedrails?: A Little Help needed moving to and from a bed to a chair (including a wheelchair)?: A Little Help needed standing up from a chair using your arms (e.g., wheelchair or bedside chair)?: A Little Help needed to walk in hospital room?: A Little Help needed climbing 3-5 steps with a railing? : A Lot 6 Click Score: 17    End of Session Equipment Utilized During Treatment: Gait belt Activity Tolerance: Patient tolerated treatment well Patient left: in chair;with call bell/phone within reach;with chair alarm set Nurse Communication: Mobility status PT Visit Diagnosis: Difficulty in walking, not elsewhere classified (R26.2);Other symptoms and signs involving the nervous system (T24.580)     Time: 9983-3825 PT Time Calculation (min) (ACUTE ONLY): 12 min  Charges:  $Gait Training: 8-22 mins                     Lorrin Goodell, PT  Office # [phone] Pager [place], [place] 09/24/2019, 12:12 PM

## 2019-09-24 NOTE — Progress Notes (Signed)
This chaplain completed the Pt. HCPOA and Living Will. The notary, 2 witnesses, and HCPOA-[name] [name] are bedside. If the HCPOA is unable or unwilling to serve the Pt. named Lisa [name] as health care agent. The original Advance Directive and copies were given to the Pt. A copy was placed in the Pt.'s chart.  F/U spiritual care is available as needed.

## 2019-09-24 NOTE — TOC Progression Note (Signed)
Transition of Care (TOC) - Progression Note    Patient Details  Name: Lisa [name] MRN: [id number] Date of Birth: 09/02/1950  Transition of Care (TOC) CM/SW Contact  Kelli F Willard, RN Phone Number: 09/24/2019, 1:17 PM  Clinical Narrative:    Pts sister in law has met with chaplain and completed the HCPOA paperwork.  CM met with SIL: Gail and gone over the bed offers for SNF. She asked that we check in with [facility] and [facility]. CM has sent Angela with [facility] a message.  TOC following.   Expected Discharge Plan: Skilled Nursing Facility Barriers to Discharge: Continued Medical Work up  Expected Discharge Plan and Services Expected Discharge Plan: Skilled Nursing Facility       Living arrangements for the past 2 months: Skilled Nursing Facility                                       Social Determinants of Health (SDOH) Interventions    Readmission Risk Interventions No flowsheet data found.  

## 2019-09-25 ENCOUNTER — Encounter (HOSPITAL_COMMUNITY): Payer: Medicare Other

## 2019-09-25 ENCOUNTER — Encounter: Payer: Medicare Other | Admitting: Vascular Surgery

## 2019-09-25 DIAGNOSIS — E1149 Type 2 diabetes mellitus with other diabetic neurological complication: Secondary | ICD-10-CM | POA: Diagnosis not present

## 2019-09-25 DIAGNOSIS — D62 Acute posthemorrhagic anemia: Secondary | ICD-10-CM | POA: Diagnosis not present

## 2019-09-25 DIAGNOSIS — I6381 Other cerebral infarction due to occlusion or stenosis of small artery: Secondary | ICD-10-CM | POA: Diagnosis not present

## 2019-09-25 DIAGNOSIS — R310 Gross hematuria: Secondary | ICD-10-CM | POA: Diagnosis not present

## 2019-09-25 LAB — GLUCOSE, CAPILLARY
Glucose-Capillary: 111 mg/dL — ABNORMAL HIGH (ref 70–99)
Glucose-Capillary: 134 mg/dL — ABNORMAL HIGH (ref 70–99)
Glucose-Capillary: 123 mg/dL — ABNORMAL HIGH (ref 70–99)
Glucose-Capillary: 113 mg/dL — ABNORMAL HIGH (ref 70–99)

## 2019-09-25 LAB — HEMOGLOBIN AND HEMATOCRIT, BLOOD
HCT: 28.8 % — ABNORMAL LOW (ref 36.0–46.0)
Hemoglobin: 8.8 g/dL — ABNORMAL LOW (ref 12.0–15.0)

## 2019-09-25 LAB — CBC
HCT: 26.6 % — ABNORMAL LOW (ref 36.0–46.0)
Hemoglobin: 8.4 g/dL — ABNORMAL LOW (ref 12.0–15.0)
MCH: 27.3 pg (ref 26.0–34.0)
MCHC: 31.6 g/dL (ref 30.0–36.0)
MCV: 86.4 fL (ref 80.0–100.0)
Platelets: 280 10*3/uL (ref 150–400)
RBC: 3.08 MIL/uL — ABNORMAL LOW (ref 3.87–5.11)
RDW: 17.3 % — ABNORMAL HIGH (ref 11.5–15.5)
WBC: 7.1 10*3/uL (ref 4.0–10.5)
nRBC: 0 % (ref 0.0–0.2)

## 2019-09-25 LAB — HEPARIN LEVEL (UNFRACTIONATED)
Heparin Unfractionated: 0.24 IU/mL — ABNORMAL LOW (ref 0.30–0.70)
Heparin Unfractionated: 0.41 IU/mL (ref 0.30–0.70)

## 2019-09-25 NOTE — Progress Notes (Addendum)
[place] for IV heparin Indication: new DVT, presumed PE  Assessment: 69 yo female with complex history including bladder cancer plus hematuria and anemia s/p TURBT procedure 8/10. Pharmacy consulted to dose heparin for treatment of BLLE DVTs with presumed PE.   Repeat heparin level at 0930 is 0.41 units/ml which is therapeutic. Patient has small amount of blood in urine but much improved from before surgery. Patient was on 150mL NaCl infusion which could contribute to low Hgb. Plts are stable. Will decrease current dose to keep patient within goal.   Goal of Therapy:  Will target heparin level 0.3-0.5 for increased risk for bleed  Monitor platelets by anticoagulation protocol: Yes   Plan:  Start IV heparin 1350 units/hr. Check AM heparin level and adjust dose as needed. Daily HL and CBC.  Monitor for new bleeding and worsening hematuria Monitor transition to oral apixaban in 1-3 days  Cephus Slater, PharmD, [facility] Resident [phone] 09/25/2019 1:29 PM

## 2019-09-25 NOTE — TOC Progression Note (Signed)
Transition of Care [facility]) - Progression Note    Patient Details  Name: Lisa [name] MRN: [id number] Date of Birth: [birth year]  [facility] CM/SW Contact  [name], RN Phone Number: 09/25/2019, 3:23 PM  Clinical Narrative:    [name] (SIL) chose [facility] for SNF rehab. [name] updated, but they wont have a bed next week. CM updated [name] and [name] selected. [name] accepted. Will need to start insurance over the weekend.   Expected Discharge Plan: [place] Barriers to Discharge: Continued Medical Work up  Expected Discharge Plan and Services Expected Discharge Plan: [name] arrangements for the past 2 months: [name]                                       Social Determinants of Health (SDOH) Interventions    Readmission Risk Interventions No flowsheet data found.

## 2019-09-25 NOTE — Progress Notes (Addendum)
Family Medicine Teaching Service Daily Progress Note Intern Pager: [phone]  Patient name: Lisa [name] Medical record number: [id number] Date of birth: 04/11/1950 Age: 69 y.o. Gender: female  Primary Care Provider: Patient, No Pcp Per Consultants: Neurology, Vascular Surgery, Urology Code Status: FULL  Pt Overview and Major Events to Date:  09/17/2019 - admitted, received 1u PRBC 09/18/2019 - Transfused2u PRBC 09/19/2019 -Transfused1u PRBC 09/20/2019 - Transfused2U PRBC 09/20/2019 - Bilateral DVTs on vascular US; heparin gtt started 09/22/2019 -TransUrethralResection ofBladderTumor (TURBT)at [facility] with intravesical gemcitabine; 2 specimen collected  Assessment and Plan: Lisa [name] is a 78 y.ofemale who presented with gross hematuria and blood loss anemia in setting of papillary carcinoma of the bladder. PMH significant for previous ischemic CVA with residual neurological defects, carotid stent placement due to carotid atherosclerosis, HTN, HLD, and type 2 diabetes.  High-grade T1 Urothelial Cell Carcinomas/p TURBT: Stable POD #3. Foley bag with minimal yellow urine, no hematuria appreciated. On heparin gtt for DVTs. Hgb this AM 8.4, this could be dilutional. - Transfusion threshold 8, afternoon H&H today - decrease fluid rate to 28mL/hr - Urology recommendations: outpatient follow up. Continue foley for 5 days. Today would be day 3/5 - Continue heparin gtt, monitor hematuria and Hgb closely - Pain control with Tylenol 650 mg - Daily CBC's - Continue PT/OT/SLP  Acute Bilateral DVTs On heparin gtt. VSS. Oxygen saturation 98-100% on room air overnight.No complaints of shortness of breath. Not tachycardic.If urine continues to be non-bloody, can consider switch to DOAC today. - Continue heparin gtt, consider switch to DOAC today if stable, no hematuria - Cardiac monitor - Monitor pulse oximetry  Acute Lacunar Infarct Multifocal intracranial  stenosis Continues to have aphasia, particularly trouble with word finding. Appears to have made some progress with speech training yesterday. Was able to sign HCPOA yesterday with chaplain. -on ASA 81 mg and Heparin gtt - Neurology has signed off. Recommend switch to DOAC on discharge - Avoid low BP. Goal 147-829 systolic - No indication for TEE as patient will be on long-term anticoagulation for her b/l DVTs - Continue PT/OT/Speech Therapy  - CM working to find SNF placement  Hypertension: chronic, stable AM BP: 147/68. Home medications: Losartan 25mg , metoprolol 25mg , amlodipine 10mg .Goal systolic 562-130. - Increased Amlodipine to 5 mg yesterday, continue - Vital signs per floor protocl   Uterine mass on CT Abd/Pelv: stable Patient with uterine mass at fundus measuring approximately 8 x 12 x 11 cm on CT scan 8/5. Not able to be visualized on pelvic U/S. Will likely need TVUS once more stable/outpatient follow up. - TVUS likely outpatient one more stable - Outpatient follow up  Carotid atherosclerosishyperlipidemia: stable Patient s/p left TCAR (08/20/2019) for symptomatic left carotid stenosis. Patient was started on aspirin and Plavixpost-procedure. Vascular USthis admissionshowed<50% stenosis of left carotid and 1-39% stenosis of right carotid artery. -Continue atorvastatin 40 mg daily -Currently on ASA 81 mgand Heparin gtt  Type 2 DM: chronic, stable HbA1c: 6.5%. Home medications: Metformin 500mg .  - holding home metformin  FEN/GI:Regular diet, with nutritional supplements [id number]:HQIONGE gtt  Disposition: SNF; awaiting transition to DOAC   Subjective:  Patient with no complaints this morning.  Did not enjoy the nutritional shakes.  Was able to eat some yesterday, still does not "really care for" the food here.  No abdominal pain, shortness of breath, or chest pain.  Objective: Temp:  [98 F (36.7 C)-98.4 F (36.9 C)] 98.4 F (36.9 C) (08/13 0300) Pulse  Rate:  [73-91] 73 (08/13 0300) Resp:  [  17-19] 19 (08/13 0300) BP: (126-169)/(64-79) 147/68 (08/13 0300) SpO2:  [96 %-100 %] 98 % (08/13 0300) Physical Exam: General: Awake, no distress, pleasant Cardiovascular: RRR Respiratory: CTAB Abdomen: soft, nontender in all quadrants GU: external visualization with no obvious blood noted around foley   Laboratory: Recent Labs  Lab 09/23/19 0513 09/24/19 0447 09/25/19 0156  WBC 7.9 7.0 7.1  HGB 10.1* 9.7* 8.4*  HCT 31.3* 30.7* 26.6*  PLT 312 306 280   Recent Labs  Lab 09/20/19 0228 09/20/19 0228 09/21/19 0128 09/22/19 0323 09/23/19 0513  NA 133*   < > 135 136 136  K 4.9   < > 3.9 3.6 3.7  CL 99   < > 102 100 101  CO2 23   < > 25 21* 23  BUN 20   < > 14 8 8   CREATININE 1.17*   < > 0.67 0.63 0.63  CALCIUM 8.4*   < > 8.5* 8.4* 8.4*  PROT 4.9*  --  5.1* 5.2*  --   BILITOT 0.9  --  1.0 0.8  --   ALKPHOS 93  --  87 79  --   ALT 13  --  14 13  --   AST 17  --  16 17  --   GLUCOSE 127*   < > 113* 103* 111*   < > = values in this interval not displayed.    Imaging/Diagnostic Tests: None new  Sharion Settler, DO 09/25/2019, 6:21 AM PGY-1, [place] Intern pager: [phone], text pages welcome

## 2019-09-25 NOTE — Progress Notes (Signed)
MD made aware of pt urine in foley bag having a serosanguineous color. 800 ml orf urine emptied.

## 2019-09-25 NOTE — Progress Notes (Signed)
   09/25/19 2227  Provider Notification  Provider Name/Title Dr Candie Chroman  Date Provider Notified 09/25/19  Time Provider Notified 2227  Notification Type Page  Notification Reason Other (Comment) (Tele called pt had 6 beats of SVT, pt quiet in bed)  Response No new orders  Date of Provider Response 09/25/19  Time of Provider Response 2238

## 2019-09-25 NOTE — Progress Notes (Signed)
ANTICOAGULATION CONSULT NOTE - Follow Up Consult  Pharmacy Consult for heparin Indication: DVT w/ presumed PE  Labs: Recent Labs    09/22/19 0323 09/22/19 1832 09/23/19 0513 09/23/19 0513 09/24/19 0447 09/24/19 1354 09/25/19 0156  HGB 9.7*   < > 10.1*   < > 9.7*  --  8.4*  HCT 30.3*   < > 31.3*  --  30.7*  --  26.6*  PLT 280  --  312  --  306  --  280  HEPARINUNFRC  --   --   --   --  0.26* 0.41 0.24*  CREATININE 0.63  --  0.63  --   --   --   --    < > = values in this interval not displayed.    Assessment: 69yo female subtherapeutic on heparin after one level at goal.  Goal of Therapy:  Heparin level 0.3-0.5 units/ml   Plan:  Will increase heparin gtt by 2 units/kg/hr to 1400 units/hr and check level in 6 hours.    Wynona Neat, PharmD, BCPS  09/25/2019,3:20 AM

## 2019-09-26 DIAGNOSIS — D62 Acute posthemorrhagic anemia: Secondary | ICD-10-CM | POA: Diagnosis not present

## 2019-09-26 DIAGNOSIS — I6381 Other cerebral infarction due to occlusion or stenosis of small artery: Secondary | ICD-10-CM | POA: Diagnosis not present

## 2019-09-26 DIAGNOSIS — Z95828 Presence of other vascular implants and grafts: Secondary | ICD-10-CM

## 2019-09-26 DIAGNOSIS — C679 Malignant neoplasm of bladder, unspecified: Secondary | ICD-10-CM | POA: Diagnosis not present

## 2019-09-26 DIAGNOSIS — I824Y3 Acute embolism and thrombosis of unspecified deep veins of proximal lower extremity, bilateral: Secondary | ICD-10-CM

## 2019-09-26 DIAGNOSIS — E1149 Type 2 diabetes mellitus with other diabetic neurological complication: Secondary | ICD-10-CM | POA: Diagnosis not present

## 2019-09-26 LAB — CBC
HCT: 29.6 % — ABNORMAL LOW (ref 36.0–46.0)
Hemoglobin: 9.3 g/dL — ABNORMAL LOW (ref 12.0–15.0)
MCH: 27.4 pg (ref 26.0–34.0)
MCHC: 31.4 g/dL (ref 30.0–36.0)
MCV: 87.3 fL (ref 80.0–100.0)
Platelets: 344 10*3/uL (ref 150–400)
RBC: 3.39 MIL/uL — ABNORMAL LOW (ref 3.87–5.11)
RDW: 17.2 % — ABNORMAL HIGH (ref 11.5–15.5)
WBC: 8.1 10*3/uL (ref 4.0–10.5)
nRBC: 0 % (ref 0.0–0.2)

## 2019-09-26 LAB — GLUCOSE, CAPILLARY
Glucose-Capillary: 115 mg/dL — ABNORMAL HIGH (ref 70–99)
Glucose-Capillary: 124 mg/dL — ABNORMAL HIGH (ref 70–99)
Glucose-Capillary: 125 mg/dL — ABNORMAL HIGH (ref 70–99)
Glucose-Capillary: 114 mg/dL — ABNORMAL HIGH (ref 70–99)

## 2019-09-26 LAB — HEPARIN LEVEL (UNFRACTIONATED): Heparin Unfractionated: 0.51 IU/mL (ref 0.30–0.70)

## 2019-09-26 MED ORDER — AMLODIPINE BESYLATE 10 MG PO TABS
10.0000 mg | ORAL_TABLET | Freq: Every day | ORAL | Status: DC
  Administered 2019-09-27 – 2019-10-02 (×5): 10 mg via ORAL
  Filled 2019-09-26 (×5): qty 1

## 2019-09-26 NOTE — TOC Progression Note (Addendum)
Transition of Care [facility]) - Progression Note    Patient Details  Name: Lisa [name] MRN: [id number] Date of Birth: [birth year]  Transition of Care [facility]) CM/SW [place], [place] Phone Number: 09/26/2019, 4:13 PM  Clinical Narrative:     CSW initiated insurance authorization with [employer]. Currently pending, reference [id number].  Expected Discharge Plan: [place] Barriers to Discharge: Continued Medical Work up  Expected Discharge Plan and Services Expected Discharge Plan: [place] arrangements for the past 2 months: [place]                                       Social Determinants of Health (SDOH) Interventions    Readmission Risk Interventions No flowsheet data found.

## 2019-09-26 NOTE — Progress Notes (Addendum)
  [facility] for IV heparin Indication: new DVT, presumed PE  Labs: Recent Labs    09/24/19 0447 09/24/19 1354 09/25/19 0156 09/25/19 0156 09/25/19 0906 09/25/19 1400 09/26/19 0302  HGB 9.7*  --  8.4*   < >  --  8.8* 9.3*  HCT 30.7*  --  26.6*  --   --  28.8* 29.6*  PLT 306  --  280  --   --   --  344  HEPARINUNFRC 0.26*   < > 0.24*  --  0.41  --  0.51   < > = values in this interval not displayed.    Assessment: 69 yo female with complex history including bladder cancer plus hematuria and anemia s/p TURBT procedure 8/10. Pharmacy consulted to dose heparin for treatment of BLLE DVTs with presumed PE.    Heparin level slightly supratherapeutic today at 0.51. H/H low but improving. Plt stable. Patient with significant hematuria with catheter per Urology note. Spoke with RN, reports dark red urine observed this morning, catheter change pending at ~0900-1000.  Goal of Therapy:  Heparin level 0.3-0.5 units/ml   Plan:  Decrease heparin at 1300 units/hr Re-check heparin level in 6 hours Daily heparin level and CBC Monitor for s/sx bleeding and worsening hematuria Monitor transition to oral apixaban in 1-2 days  Fara Olden, PharmD PGY-1 Pharmacy Resident 09/26/2019 7:52 AM  Please check [email or web address] for unit-specific pharmacy phone numbers.  Addendum I agree with the assessment and plan. Wilburn Cornelia spoke with the RN, patient with persistent hematuria. Normally would consider holding heparin in a patient with persistent hematuria over several days; however, with bilateral DVTs, this patient requires heparin. Has PE been confirmed or ruled out? Would consider evaluating for PE.   Harvel Quale 09/26/2019 9:12 AM

## 2019-09-26 NOTE — Progress Notes (Addendum)
Family Medicine Teaching Service Daily Progress Note Intern Pager: [phone]  Patient name: Lisa [name] Medical record number: [id number] Date of birth: [birth year] Age: 69 y.o. Gender: female  Primary Care Provider: Patient, No Pcp Per Consultants: Neurology, vascular surgery, urology Code Status: Full  Pt Overview and Major Events to Date:  09/17/2019 - admitted, received 1u PRBC 09/18/2019 - Transfused2u PRBC 09/19/2019 -Transfused1u PRBC 09/20/2019 - Transfused2U PRBC 09/20/2019 - Bilateral DVTs on vascular US; heparin gtt started 09/22/2019 -[facility]; 2 specimen collected  Assessment and Plan: Lisa [name] is a 89 y.ofemale who presented with gross hematuria and blood loss anemia in setting of papillary carcinoma of the bladder. PMH significant for previous ischemic CVA with residual neurological defects, carotid stent placement due to carotid atherosclerosis, HTN, HLD, and type 2 diabetes.  High-grade T1 Urothelial Cell Carcinomas/p TURBT: Stable POD #4. Hemoglobin 9.3 this morning. Foley bag with about 400cc frank hematuria with some clotting.  On heparin gtt for DVTs.   Seen by urologist, Dr. Alinda Money, plan to place hematuria catheter today for better irrigation of bladder and clots.  Will hold off on switching to DOAC today. - Transfusion threshold 8 - Continue gentle hydration - Appreciate urology recommendations: hematuria catheter today -Continue heparin gtt, monitor hematuria and Hgb closely - Pain control with Tylenol 650 mg - Daily CBC's - Continue PT/OT/SLP  Acute Bilateral DVTs On heparin gtt. Oxygen saturation 98-100% on room air overnight. Not tachycardic. Asymptomatic. We will continue heparin, we will hold off on transitioning to [place] today.  Can consider transitioning tomorrow if hematuria improved/resolved. -Continueheparin gtt - Cardiac monitor - Monitor pulse  oximetry  Acute Lacunar Infarct  Multifocal intracranial stenosis Continues to have aphasia, particularly trouble with word finding. Able to answer yes/no appropriately. Working with SLP. -on ASA 81 mg and Heparin gtt - Neurology has signed off. Recommend switch to DOAC on discharge - Avoid low BP. Goal 469-629 systolic - No indication for TEE as patient will be on long-term anticoagulation for her b/l DVTs - Continue PT/OT/Speech Therapy  - CM working to find SNF placement  Hypertension: chronic, stable AM BP:157/71. Home medications: Losartan 25mg , metoprolol 25mg , amlodipine 10mg .Goal systolic 528-413. - Increasing to 10 mg Amlodipine -Vital signs per floor protocol  Uterine mass on CT Abd/Pelv: stable Patient with uterine mass at fundusmeasuring approximately 8 x 12 x 11 cmon CT scan 8/5. Not able to be visualized on pelvic U/S. Will likely need TVUS once more stable/outpatient follow up. - TVUS likely outpatient one more stable - Outpatient follow up  Carotid atherosclerosis hyperlipidemia: stable Patient s/p left TCAR (08/20/2019) for symptomatic left carotid stenosis. Patient was started on aspirin and Plavixpost-procedure. Vascular USthis admissionshowed<50% stenosis of left carotid and 1-39% stenosis of right carotid artery. -Continue atorvastatin 40 mg daily -Currently on ASA 81 mgand Heparin gtt  Type 2 DM: chronic, stable HbA1c: 6.5%. Home medications: Metformin 500mg .  - holding home metformin  FEN/GI:Regular diet, with nutritional supplements [id number]:[id number] gtt  Disposition: SNF pending transition to DOAC  Subjective:  Patient has no pain or complaints. Her husband is in the room. She is hopeful that he can bring her some better food to eat. She is aware of the plan to switch the foley today. She has no chest pain, shortness of breath, leg pain.  Objective: Temp:  [98.2 F (36.8 C)-98.5 F (36.9 C)] 98.3 F (36.8 C) (08/13 2356) Pulse  Rate:  [72-79] 74 (08/13 2356) Resp:  [16-20] 20 (08/13 2356)  BP: (137-171)/(63-82) 155/82 (08/13 2356) SpO2:  [97 %-100 %] 98 % (08/13 2356) Physical Exam: General: Awake, in no distress Cardiovascular: RRR Respiratory: CTAB Abdomen: soft, non tender in all quadrants, non distended GU: no active bleeding noticed around foley on external visualization Extremities: no edema, no leg pain, 2+ DP pulses b/l   Laboratory: Recent Labs  Lab 09/24/19 0447 09/24/19 0447 09/25/19 0156 09/25/19 1400 09/26/19 0302  WBC 7.0  --  7.1  --  8.1  HGB 9.7*   < > 8.4* 8.8* 9.3*  HCT 30.7*   < > 26.6* 28.8* 29.6*  PLT 306  --  280  --  344   < > = values in this interval not displayed.   Recent Labs  Lab 09/20/19 0228 09/20/19 0228 09/21/19 0128 09/22/19 0323 09/23/19 0513  NA 133*   < > 135 136 136  K 4.9   < > 3.9 3.6 3.7  CL 99   < > 102 100 101  CO2 23   < > 25 21* 23  BUN 20   < > 14 8 8   CREATININE 1.17*   < > 0.67 0.63 0.63  CALCIUM 8.4*   < > 8.5* 8.4* 8.4*  PROT 4.9*  --  5.1* 5.2*  --   BILITOT 0.9  --  1.0 0.8  --   ALKPHOS 93  --  87 79  --   ALT 13  --  14 13  --   AST 17  --  16 17  --   GLUCOSE 127*   < > 113* 103* 111*   < > = values in this interval not displayed.    Imaging/Diagnostic Tests: None new  Sharion Settler, DO 09/26/2019, 8:23 AM PGY-1, [place] Intern pager: [phone], text pages welcome

## 2019-09-26 NOTE — Progress Notes (Signed)
Urology MD paged for orders for CBI. Gwendolyn Grant, RN

## 2019-09-26 NOTE — Progress Notes (Signed)
Patient ID: Lisa [name], female   DOB: Nov 22, 1950, 69 y.o.   MRN: [id number]  4 Days Post-Op Subjective: Pt s/p TURBT on Tuesday.  Heparin gtt restarted.  Urine was reportedly clear on Thursday prior to heparin being restarted.  Objective: Vital signs in last 24 hours: Temp:  [98.2 F (36.8 C)-98.5 F (36.9 C)] 98.3 F (36.8 C) (08/13 2356) Pulse Rate:  [72-79] 74 (08/13 2356) Resp:  [16-20] 20 (08/13 2356) BP: (137-171)/(63-82) 155/82 (08/13 2356) SpO2:  [97 %-100 %] 98 % (08/13 2356)  Intake/Output from previous day: 08/13 0701 - 08/14 0700 In: 240 [P.O.:240] Out: 3025 [Urine:3025] Intake/Output this shift: No intake/output data recorded.  Physical Exam:  General: Alert and oriented GU: Urine very red and clotted.  Attempts to irrigate via 16 Fr Foley unsuccessful.  Lab Results: Recent Labs    09/25/19 0156 09/25/19 1400 09/26/19 0302  HGB 8.4* 8.8* 9.3*  HCT 26.6* 28.8* 29.6*   BMET No results for input(s): NA, K, CL, CO2, GLUCOSE, BUN, CREATININE, CALCIUM in the last 72 hours.   Studies/Results: No results found.  Assessment/Plan: 1) Bladder tumor/hematuria:  Pt now with significant hematuria with catheter that is not irrigating well since back on heparin.  Will plan to change catheter today to hematuria catheter to irrigate and potentially start CBI if indicated.  Hgb stable so it would appear no significant active bleeding although this will be better assessed after new catheter is placed.   LOS: 9 days   Dutch Gray 09/26/2019, 8:11 AM

## 2019-09-26 NOTE — Progress Notes (Signed)
Patient ID: Lisa [name], female   DOB: 1950/06/10, 69 y.o.   MRN: [id number]  Catheter changed to 24 Fr 3 way hematuria catheter.  Upon irrigation, no clot noted within the bladder but urine remains quite red.  Therefore, CBI was started and left at moderate drip with plans to titrate if possible.  Pt would high risk to stop anticoagulation due acute DVT.  Would continue IV heparin for now though due to ongoing bleeding.  Hgb stable.

## 2019-09-27 ENCOUNTER — Other Ambulatory Visit: Payer: Self-pay

## 2019-09-27 DIAGNOSIS — E1149 Type 2 diabetes mellitus with other diabetic neurological complication: Secondary | ICD-10-CM | POA: Diagnosis not present

## 2019-09-27 DIAGNOSIS — I824Y3 Acute embolism and thrombosis of unspecified deep veins of proximal lower extremity, bilateral: Secondary | ICD-10-CM | POA: Diagnosis not present

## 2019-09-27 DIAGNOSIS — I6381 Other cerebral infarction due to occlusion or stenosis of small artery: Secondary | ICD-10-CM | POA: Diagnosis not present

## 2019-09-27 DIAGNOSIS — D62 Acute posthemorrhagic anemia: Secondary | ICD-10-CM | POA: Diagnosis not present

## 2019-09-27 LAB — HEPARIN LEVEL (UNFRACTIONATED)
Heparin Unfractionated: 0.52 IU/mL (ref 0.30–0.70)
Heparin Unfractionated: 0.53 IU/mL (ref 0.30–0.70)
Heparin Unfractionated: 0.49 IU/mL (ref 0.30–0.70)

## 2019-09-27 LAB — CBC
HCT: 26.6 % — ABNORMAL LOW (ref 36.0–46.0)
Hemoglobin: 8.2 g/dL — ABNORMAL LOW (ref 12.0–15.0)
MCH: 27.1 pg (ref 26.0–34.0)
MCHC: 30.8 g/dL (ref 30.0–36.0)
MCV: 87.8 fL (ref 80.0–100.0)
Platelets: 338 10*3/uL (ref 150–400)
RBC: 3.03 MIL/uL — ABNORMAL LOW (ref 3.87–5.11)
RDW: 16.9 % — ABNORMAL HIGH (ref 11.5–15.5)
WBC: 8.1 10*3/uL (ref 4.0–10.5)
nRBC: 0 % (ref 0.0–0.2)

## 2019-09-27 LAB — HEMOGLOBIN AND HEMATOCRIT, BLOOD
HCT: 25.4 % — ABNORMAL LOW (ref 36.0–46.0)
Hemoglobin: 8 g/dL — ABNORMAL LOW (ref 12.0–15.0)

## 2019-09-27 LAB — GLUCOSE, CAPILLARY: Glucose-Capillary: 107 mg/dL — ABNORMAL HIGH (ref 70–99)

## 2019-09-27 MED ORDER — PNEUMOCOCCAL VAC POLYVALENT 25 MCG/0.5ML IJ INJ
0.5000 mL | INJECTION | INTRAMUSCULAR | Status: DC
  Filled 2019-09-27: qty 0.5

## 2019-09-27 NOTE — Progress Notes (Signed)
Vital signs for mid-night and 4:00AM are omitted because Pt requested they should not be done, as not to interrupt her sleep.

## 2019-09-27 NOTE — Progress Notes (Signed)
Patient has gross hematuria this am with continuous catheter irrigation.  Tubing from irrigation catheter is not draining well with clots in bag and tubing.  Notified both the urologist Dr. Alinda Money, MD and IM Resident Dr. Ouida Sills, DO.  Per Dr. Alinda Money, MD manual flushing of the bladder with sterile normal saline was performed which resulted is a large clot flushed out from the bladder. Continuous irrigation was restarted at a moderate flow rate, with pink urine currently running through tubing.  Continue to monitor urine color and clarity along with the presence of clots.

## 2019-09-27 NOTE — Progress Notes (Signed)
  [place] for IV heparin Indication: new DVT, presumed PE  Labs: Recent Labs    09/25/19 0156 09/25/19 0156 09/25/19 0906 09/25/19 1400 09/25/19 1400 09/26/19 0302 09/27/19 0329  HGB 8.4*   < >  --  8.8*   < > 9.3* 8.2*  HCT 26.6*   < >  --  28.8*  --  29.6* 26.6*  PLT 280  --   --   --   --  344 338  HEPARINUNFRC 0.24*   < > 0.41  --   --  0.51 0.52   < > = values in this interval not displayed.    Assessment: 69 yo female with complex history including bladder cancer plus hematuria and anemia s/p TURBT procedure 8/10. Pharmacy consulted to dose heparin for treatment of BLLE DVTs with presumed PE.    Heparin level remains supratherapeutic today at 0.52. H/H low, overall stable. Plt stable.   Patient with significant hematuria with catheter. Given bilateral DVTs, this patient is not a candidate for holding heparin.  Catheter was changed yesterday on 8/14. Per Urology on 8/14, no clot noted within bladder but urine remains red, continue IV heparin for now.  Spoke to RN this morning, denies any issues with heparin infusion. Reports pt continues to have hematuria.  Goal of Therapy:  Heparin level 0.3-0.5 units/ml   Plan:  Decrease heparin at 1200 units/hr Check heparin level in 6 hours Daily heparin level and CBC Monitor for s/sx bleeding and worsening hematuria Monitor transition to oral apixaban in 1-2 days  Fara Olden, PharmD PGY-1 Pharmacy Resident 09/27/2019 7:13 AM  Please check [email or web address] for unit-specific pharmacy phone numbers.

## 2019-09-27 NOTE — Progress Notes (Signed)
Dr. Alinda Money add bedside performing manual irrigation of bladder.  Clots were removed from the irrigation process, pt tolerated procedure well. Urine was pink following irrigation with new bag of 0.9 % NaCl irrigation fluid started with Dr. Alinda Money present. Dr. Alinda Money manually adjusted irrigation rate with roller clamp for a moderate flow rate.  Instructions were provided for bedside nurse to change bags as needed when empty but to keep the rate that he had set constant.

## 2019-09-27 NOTE — Progress Notes (Addendum)
Family Medicine Teaching Service Daily Progress Note Intern Pager: [phone]  Patient name: Lisa [name] Medical record number: [id number] Date of birth: Feb 24, 1950 Age: 69 y.o. Gender: female  Primary Care Provider: Patient, No Pcp Per Consultants: Neurology, vascular surgery, urology Code Status: Full  Pt Overview and Major Events to Date:  09/17/2019 - admitted, received 1u PRBC 09/18/2019 - Transfused2u PRBC 09/19/2019 -Transfused1u PRBC 09/20/2019 - Transfused2U PRBC 09/20/2019 - Bilateral DVTs on vascular US; heparin gtt started 09/22/2019 -[facility]; 2 specimen collected  Assessment and Plan: Lisa [name] is a 33 y.ofemale who presented with gross hematuria and blood loss anemia in setting of papillary carcinoma of the bladder. PMH significant for previous ischemic CVA with residual neurological defects, carotid stent placement due to carotid atherosclerosis, HTN, HLD, and type 2 diabetes.  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 Hemoglobin 8.2 this AM, was 9.3. Foley bag with about 300cc frank hematuria with some clotting. Remains on heparin gtt for DVTs. Nurse today 8/15 was able to irrigate bladder and remove clot, after which urine appearance was improved, only pink in nature. Dr. Alinda Money to see pt today 8/15 s/p surgeries this AM at [facility]. Will hold off on switching to DOAC today. I&Os report about 5L output, however much of this is from CBI (continues bladder irrigation)  - Transfusion threshold 8 - Continue gentle hydration - Appreciate urology recommendations: continue CBI -Continue heparin gtt, monitor hematuria and Hgb closely - Pain control with Tylenol 650 mg - Daily CBC's - Continue PT/OT/SLP - As the patient is currently close to her threshold of 8 I have ordered an afternoon H&H to stay on top of her anemia. If H&H is  less than 8.0 will transfuse 1u PRBC.   Acute Bilateral DVTs, stable On heparin gtt. Oxygen saturation 98-100% on room air overnight. Not tachycardic. Asymptomatic.  -Continueheparin gtt - Cardiac monitor - Monitor pulse oximetry  Acute Lacunar Infarct Multifocal intracranial stenosis, stable Continues to have aphasia, particularly trouble with word finding. Able to answer yes/no appropriately. Working with SLP. -on ASA 81 mg and Heparin gtt - Neurology has signed off. Recommend switch to DOAC on discharge (Eliquis) - Avoid low BP. Goal 790-240 systolic - No indication for TEE as patient will be on long-term anticoagulation for her b/l DVTs - Continue PT/OT/Speech Therapy  - CM working to find SNF placement  Hypertension: chronic, stable AM BP:132/64, within goal range. Home medications: Losartan 25mg , metoprolol 25mg , amlodipine 10mg .Goal systolic 973-532. - Continue amlodipine 10 mg, holding home metoprolol and losartan -Vital signs per floor protocol(recommended avoiding early a.m. vitals as patient politely requesting rest)  Carotid atherosclerosishyperlipidemia: stable Patient s/p left TCAR (08/20/2019) for symptomatic left carotid stenosis. Patient was started on aspirin and Plavixpost-procedure. Vascular USthis admission showed <50% stenosis of left and right carotids. - Continue atorvastatin 40 mg daily -Currently on ASA 81 mgand Heparin gtt, plan to switch to Eliquis once bladder bleeding has decreased  Type 2 DM: chronic, stable HbA1c: 6.5%.  CBG this a.m. 107.  Home medications: Metformin 500mg .  - holding home metformin - Continue to monitor CBGs however with reduced frequency (twice daily, once before breakfast, prior to bedtime) - Patient not requiring any insulin, continue to hold off on sliding scale  Uterine mass on CT Abd/Pelv: stable Patient with uterine mass at fundusmeasuring approximately 8 x 12 x 11 cmon CT scan 8/5. Not able to be  visualized on pelvic U/S. Will  likely need TVUS once more stable/outpatient follow up. - TVUS likely outpatient one more stable - Outpatient follow up  FEN/GI:Regular diet, with nutritional supplements UMP:NTIRWER gtt  Disposition: SNF pending transition to DOAC  Subjective:  Patient seen resting comfortably in bed, expresses her frustration with having to still be in the hospital, but is hopeful the bladder bleeding will stop soon.  Objective: Temp:  [97.5 F (36.4 C)-98.4 F (36.9 C)] 98.4 F (36.9 C) (08/15 0756) Pulse Rate:  [71-81] 73 (08/15 0756) Resp:  [16-20] 18 (08/15 0756) BP: (115-158)/(63-73) 132/64 (08/15 0756) SpO2:  [97 %-100 %] 97 % (08/15 0756) Physical Exam: General: Pleasant patient, nontoxic-appearing Cardiovascular: RRR, S1-S2 present Respiratory: CTA bilaterally, no adventitious sounds auscultated Abdomen: Normal bowel sounds appreciated, nondistended, nontender to palpation GU: Foley producing frank red blood, significantly improved after flush/irrigation of clot  Extremities: no edema, no leg pain, 2+ DP pulses b/l   Laboratory: Recent Labs  Lab 09/25/19 0156 09/25/19 0156 09/25/19 1400 09/26/19 0302 09/27/19 0329  WBC 7.1  --   --  8.1 8.1  HGB 8.4*   < > 8.8* 9.3* 8.2*  HCT 26.6*   < > 28.8* 29.6* 26.6*  PLT 280  --   --  344 338   < > = values in this interval not displayed.   Recent Labs  Lab 09/21/19 0128 09/22/19 0323 09/23/19 0513  NA 135 136 136  K 3.9 3.6 3.7  CL 102 100 101  CO2 25 21* 23  BUN 14 8 8   CREATININE 0.67 0.63 0.63  CALCIUM 8.5* 8.4* 8.4*  PROT 5.1* 5.2*  --   BILITOT 1.0 0.8  --   ALKPHOS 87 79  --   ALT 14 13  --   AST 16 17  --   GLUCOSE 113* 103* 111*    Imaging/Diagnostic Tests: None new  Daisy Floro, DO 09/27/2019, 10:21 AM PGY-3, [place] Intern pager: [phone], text pages welcome

## 2019-09-27 NOTE — Progress Notes (Signed)
  [place] for IV heparin Indication: new DVT, presumed PE   Labs: Recent Labs    09/25/19 0156 09/25/19 0906 09/26/19 0302 09/26/19 0302 09/27/19 0329 09/27/19 1408  HGB 8.4*   < > 9.3*   < > 8.2* 8.0*  HCT 26.6*   < > 29.6*  --  26.6* 25.4*  PLT 280  --  344  --  338  --   HEPARINUNFRC 0.24*   < > 0.51  --  0.52 0.53   < > = values in this interval not displayed.    Assessment: 69 yo female with complex history including bladder cancer plus hematuria and anemia s/p TURBT procedure 8/10. Pharmacy consulted to dose heparin for treatment of BLLE DVTs with presumed PE.    Heparin level remains supratherapeutic after rate decrease, now at 0.53. Hgb low and down trending, transfusion threshold < 8.Plt stable.   Patient with significant hematuria with catheter. Catheter was changed on 8/14.   Episodes of clotting in bladder, removed via irrigation, urine pink with CBI. Spoke with RN, unable to confirm location of heparin level drawn. No other issues noted.  Per Urology on 8/15, may consider clot evacuation vs fulguration, continue IV heparin for now. If hematuria continues, pt may require IVC filter requiring anticoagulation hold. Will continue to monitor and follow Urology recommendations.  Goal of Therapy:  Heparin level 0.3-0.5 units/ml   Plan:  Decrease heparin at 1150 units/hr Check heparin level in 6 hours Daily heparin level and CBC Monitor for s/sx bleeding and worsening hematuria Monitor transition to oral apixaban when hematuria resolves  Fara Olden, PharmD PGY-1 Pharmacy Resident 09/27/2019 2:45 PM  Please check [email or web address] for unit-specific pharmacy phone numbers.

## 2019-09-27 NOTE — Progress Notes (Signed)
Patient ID: Lisa [name], female   DOB: [birth year], 69 y.o.   MRN: [id number]  5 Days Post-Op Subjective: Pt with one episode of clot retention last night.  I hand irrigated a large clot out and was able to restart CBI with urine becoming mostly clear on moderate CB drip.  This again occurred earlier this morning and catheter was again irrigated with more clot removed by nursing staff and CBI able to be restarted.  Objective: Vital signs in last 24 hours: Temp:  [97.5 F (36.4 C)-98.4 F (36.9 C)] 98.4 F (36.9 C) (08/15 0756) Pulse Rate:  [71-81] 73 (08/15 0756) Resp:  [16-20] 18 (08/15 0756) BP: (115-158)/(63-73) 132/64 (08/15 0756) SpO2:  [97 %-100 %] 97 % (08/15 0756)  Intake/Output from previous day: 08/14 0701 - 08/15 0700 In: 757.3 [P.O.:120; I.V.:637.3] Out: 5250 [Urine:5250] Intake/Output this shift: Total I/O In: -  Out: 3300 [Urine:3300]  Physical Exam:  General: Alert and oriented GU: Urine currently clear on mild CBI  Lab Results: Recent Labs    09/25/19 1400 09/26/19 0302 09/27/19 0329  HGB 8.8* 9.3* 8.2*  HCT 28.8* 29.6* 26.6*   Procedures: I hand irrigated catheter this morning.  Catheter still with some difficulty irrigating by hand.  Finally a large clot was removed.  Catheter now irrigating well.  CBI restarted and urine mostly clear on mild to moderate drip again.  Studies/Results: Path: High grade, T1 urothelial carcinoma  Assessment/Plan: 1) Bladder tumor/hematuria: I will allow Dr. Claudia Desanctis to discuss pathology with her and her family.  Hgb relatively stable over last two days but still requiring CBI.  Catheter acting as if there may be mild (and improving) active minor bleeding but also may still have some clot in the bladder that is lysing and causing periodic catheter obstruction.  Will continue CBI and reviewed with nursing staff to continue periodic hand irrigation of catheter as needed.  In addition, patient refuses to leave hospital with a  catheter.  As such, if urine is not able to be completely cleared, she may benefit from additional cystoscopic procedure for clot evacuation vs fulguration.  Therefore, would keep patient on IV heparin drip for now until that decision is definitive per Dr. Claudia Desanctis.  If she continues to bleed despite conservative management, there is a possibility that she could require IVC filter and stopping anticoagulation for a period of time.   LOS: 10 days   Dutch Gray 09/27/2019, 11:53 AM

## 2019-09-28 ENCOUNTER — Encounter (HOSPITAL_COMMUNITY)
Admission: EM | Disposition: A | Discharge status: Skilled Nursing Facility | Payer: Self-pay | Source: Home / Self Care | Attending: Family Medicine

## 2019-09-28 ENCOUNTER — Inpatient Hospital Stay (HOSPITAL_COMMUNITY): Payer: Medicare Other | Admitting: Anesthesiology

## 2019-09-28 DIAGNOSIS — I6381 Other cerebral infarction due to occlusion or stenosis of small artery: Secondary | ICD-10-CM | POA: Diagnosis not present

## 2019-09-28 DIAGNOSIS — I824Y3 Acute embolism and thrombosis of unspecified deep veins of proximal lower extremity, bilateral: Secondary | ICD-10-CM | POA: Diagnosis not present

## 2019-09-28 DIAGNOSIS — D62 Acute posthemorrhagic anemia: Secondary | ICD-10-CM | POA: Diagnosis not present

## 2019-09-28 DIAGNOSIS — E1149 Type 2 diabetes mellitus with other diabetic neurological complication: Secondary | ICD-10-CM | POA: Diagnosis not present

## 2019-09-28 HISTORY — PX: CYSTOSCOPY WITH FULGERATION: SHX6638

## 2019-09-28 LAB — POCT I-STAT, CHEM 8
BUN: 8 mg/dL (ref 8–23)
Calcium, Ion: 1.28 mmol/L (ref 1.15–1.40)
Chloride: 98 mmol/L (ref 98–111)
Creatinine, Ser: 0.5 mg/dL (ref 0.44–1.00)
Glucose, Bld: 141 mg/dL — ABNORMAL HIGH (ref 70–99)
HCT: 21 % — ABNORMAL LOW (ref 36.0–46.0)
Hemoglobin: 7.1 g/dL — ABNORMAL LOW (ref 12.0–15.0)
Potassium: 3.7 mmol/L (ref 3.5–5.1)
Sodium: 139 mmol/L (ref 135–145)
TCO2: 26 mmol/L (ref 22–32)

## 2019-09-28 LAB — PREPARE RBC (CROSSMATCH)

## 2019-09-28 LAB — CBC
HCT: 23.5 % — ABNORMAL LOW (ref 36.0–46.0)
Hemoglobin: 7.6 g/dL — ABNORMAL LOW (ref 12.0–15.0)
MCH: 28.3 pg (ref 26.0–34.0)
MCHC: 32.3 g/dL (ref 30.0–36.0)
MCV: 87.4 fL (ref 80.0–100.0)
Platelets: 323 10*3/uL (ref 150–400)
RBC: 2.69 MIL/uL — ABNORMAL LOW (ref 3.87–5.11)
RDW: 17.2 % — ABNORMAL HIGH (ref 11.5–15.5)
WBC: 7.1 10*3/uL (ref 4.0–10.5)
nRBC: 0.3 % — ABNORMAL HIGH (ref 0.0–0.2)

## 2019-09-28 LAB — HEMOGLOBIN AND HEMATOCRIT, BLOOD
HCT: 31 % — ABNORMAL LOW (ref 36.0–46.0)
Hemoglobin: 10.1 g/dL — ABNORMAL LOW (ref 12.0–15.0)

## 2019-09-28 LAB — GLUCOSE, CAPILLARY
Glucose-Capillary: 109 mg/dL — ABNORMAL HIGH (ref 70–99)
Glucose-Capillary: 123 mg/dL — ABNORMAL HIGH (ref 70–99)

## 2019-09-28 LAB — HEPARIN LEVEL (UNFRACTIONATED): Heparin Unfractionated: 0.48 IU/mL (ref 0.30–0.70)

## 2019-09-28 SURGERY — CYSTOSCOPY, WITH BLADDER FULGURATION
Anesthesia: General | Site: Bladder

## 2019-09-28 MED ORDER — CEFAZOLIN SODIUM-DEXTROSE 2-3 GM-%(50ML) IV SOLR
INTRAVENOUS | Status: DC | PRN
  Administered 2019-09-28: 2 g via INTRAVENOUS

## 2019-09-28 MED ORDER — 0.9 % SODIUM CHLORIDE (POUR BTL) OPTIME
TOPICAL | Status: DC | PRN
  Administered 2019-09-28: 1000 mL

## 2019-09-28 MED ORDER — CHLORHEXIDINE GLUCONATE 0.12 % MT SOLN
OROMUCOSAL | Status: AC
  Administered 2019-09-28: 15 mL via OROMUCOSAL
  Filled 2019-09-28: qty 15

## 2019-09-28 MED ORDER — CHLORHEXIDINE GLUCONATE 0.12 % MT SOLN: 15.0000 mL | Freq: Once | OROMUCOSAL | Status: AC

## 2019-09-28 MED ORDER — ONDANSETRON HCL 4 MG/2ML IJ SOLN
INTRAMUSCULAR | Status: DC | PRN
  Administered 2019-09-28: 4 mg via INTRAVENOUS

## 2019-09-28 MED ORDER — FENTANYL CITRATE (PF) 100 MCG/2ML IJ SOLN
INTRAMUSCULAR | Status: DC | PRN
  Administered 2019-09-28 (×2): 25 ug via INTRAVENOUS
  Administered 2019-09-28: 50 ug via INTRAVENOUS

## 2019-09-28 MED ORDER — SODIUM CHLORIDE 0.9% IV SOLUTION: Freq: Once | INTRAVENOUS | Status: DC

## 2019-09-28 MED ORDER — CEFAZOLIN SODIUM-DEXTROSE 2-4 GM/100ML-% IV SOLN
INTRAVENOUS | Status: AC
  Filled 2019-09-28: qty 100

## 2019-09-28 MED ORDER — FENTANYL CITRATE (PF) 250 MCG/5ML IJ SOLN
INTRAMUSCULAR | Status: AC
  Filled 2019-09-28: qty 5

## 2019-09-28 MED ORDER — PROPOFOL 10 MG/ML IV BOLUS
INTRAVENOUS | Status: AC
  Filled 2019-09-28: qty 40

## 2019-09-28 MED ORDER — LACTATED RINGERS IV SOLN: INTRAVENOUS | Status: DC | PRN

## 2019-09-28 MED ORDER — PROPOFOL 10 MG/ML IV BOLUS
INTRAVENOUS | Status: DC | PRN
  Administered 2019-09-28: 120 mg via INTRAVENOUS

## 2019-09-28 MED ORDER — HEPARIN (PORCINE) 25000 UT/250ML-% IV SOLN
1150.0000 [IU]/h | INTRAVENOUS | Status: DC
  Administered 2019-09-28: 1150 [IU]/h via INTRAVENOUS
  Filled 2019-09-28: qty 250

## 2019-09-28 MED ORDER — SODIUM CHLORIDE 0.9 % IR SOLN
Status: DC | PRN
  Administered 2019-09-28 (×12): 3000 mL via INTRAVESICAL

## 2019-09-28 MED ORDER — LIDOCAINE HCL (CARDIAC) PF 100 MG/5ML IV SOSY
PREFILLED_SYRINGE | INTRAVENOUS | Status: DC | PRN
  Administered 2019-09-28: 30 mg via INTRAVENOUS

## 2019-09-28 MED ORDER — MIDAZOLAM HCL 2 MG/2ML IJ SOLN
INTRAMUSCULAR | Status: AC
  Filled 2019-09-28: qty 2

## 2019-09-28 SURGICAL SUPPLY — 23 items
BAG DRN RND TRDRP ANRFLXCHMBR (UROLOGICAL SUPPLIES) ×1
BAG URINE DRAIN 2000ML AR STRL (UROLOGICAL SUPPLIES) ×2 IMPLANT
BAG URO CATCHER STRL LF (MISCELLANEOUS) ×2 IMPLANT
CATH HEMA 3WAY 30CC 24FR COUDE (CATHETERS) ×1 IMPLANT
GLOVE BIO SURGEON STRL SZ 6.5 (GLOVE) ×2 IMPLANT
GOWN STRL REUS W/ TWL LRG LVL3 (GOWN DISPOSABLE) ×2 IMPLANT
GOWN STRL REUS W/TWL LRG LVL3 (GOWN DISPOSABLE) ×6
GUIDEWIRE ANG ZIPWIRE 038X150 (WIRE) IMPLANT
GUIDEWIRE STR DUAL SENSOR (WIRE) IMPLANT
IV NS IRRIG 3000ML ARTHROMATIC (IV SOLUTION) ×12 IMPLANT
KIT TURNOVER KIT B (KITS) ×2 IMPLANT
LOOP CUT BIPOLAR 24F LRG (ELECTROSURGICAL) ×2 IMPLANT
MANIFOLD NEPTUNE II (INSTRUMENTS) ×2 IMPLANT
PACK CYSTO (CUSTOM PROCEDURE TRAY) ×2 IMPLANT
PAD ARMBOARD 7.5X6 YLW CONV (MISCELLANEOUS) ×2 IMPLANT
SYR 30ML LL (SYRINGE) ×1 IMPLANT
SYR CONTROL 10ML LL (SYRINGE) ×1 IMPLANT
SYR TOOMEY IRRIG 70ML (MISCELLANEOUS) ×2
SYRINGE TOOMEY IRRIG 70ML (MISCELLANEOUS) IMPLANT
TOWEL GREEN STERILE FF (TOWEL DISPOSABLE) ×2 IMPLANT
TUBE CONNECTING 20X1/4 (TUBING) ×2 IMPLANT
UNDERPAD 30X36 HEAVY ABSORB (UNDERPADS AND DIAPERS) ×2 IMPLANT
WATER STERILE IRR 1000ML POUR (IV SOLUTION) ×2 IMPLANT

## 2019-09-28 NOTE — Progress Notes (Signed)
Urology Inpatient Progress Report  Gross hematuria [R31.0] Acute lacunar stroke ([place]) [I63.81] Lacunar infarct, acute ([place]) [I63.81] Symptomatic anemia [D64.9]  Procedure(s): TRANSURETHRAL RESECTION OF BLADDER TUMOR WITH INTRAVESICAL GEMCITABINE  6 Days Post-Op   Intv/Subj: No acute events overnight.  No hand irrigation of foley needed last night.    Principal Problem:   Lacunar infarct, acute ([place]) Active Problems:   CVA (cerebral vascular accident) ([place])   Acute ischemic stroke ([place])   Hypertension   DM (diabetes mellitus), type 2, uncontrolled w/neurologic complication ([place])   Stenosis of carotid artery   Hematuria   Primary papillary carcinoma of bladder (HCC)   Acute blood loss anemia   Aphasia   Palliative care by specialist   DNR (do not resuscitate) discussion   Symptomatic anemia   Weakness generalized   Acute deep vein thrombosis (DVT) of proximal vein of both lower extremities (HCC)  Current Facility-Administered Medications  Medication Dose Route Frequency Provider Last Rate Last Admin  .  stroke: mapping our early stages of recovery book   Does not apply Once Milus Banister C, DO      . 0.9 %  sodium chloride infusion (Manually program via Guardrails IV Fluids)   Intravenous Once Simmons-Robinson, Makiera, MD 10 mL/hr at 09/20/19 1420 Restarted at 09/20/19 1420  . acetaminophen (TYLENOL) tablet 650 mg  650 mg Oral Q6H PRN Milus Banister C, DO   650 mg at 09/18/19 1819   Or  . acetaminophen (TYLENOL) suppository 650 mg  650 mg Rectal Q6H PRN Milus Banister C, DO      . amLODipine (NORVASC) tablet 10 mg  10 mg Oral Daily Simmons-Robinson, Makiera, MD   10 mg at 09/27/19 1045  . aspirin chewable tablet 81 mg  81 mg Oral Daily Martyn Malay, MD   81 mg at 09/27/19 1045  . atorvastatin (LIPITOR) tablet 40 mg  40 mg Oral Daily Martyn Malay, MD   40 mg at 09/27/19 1045  . bisacodyl (DULCOLAX) suppository 10 mg  10 mg Rectal Daily PRN Daisy Floro, DO       . Chlorhexidine Gluconate Cloth 2 % PADS 6 each  6 each Topical Daily Martyn Malay, MD   6 each at 09/27/19 1045  . feeding supplement (ENSURE ENLIVE) (ENSURE ENLIVE) liquid 237 mL  237 mL Oral BID BM Martyn Malay, MD   237 mL at 09/26/19 0908  . heparin ADULT infusion 100 units/mL (25000 units/224mL sodium chloride 0.45%)  1,150 Units/hr Intravenous Continuous Leward Quan, RPH 11.5 mL/hr at 09/27/19 1811 1,150 Units/hr at 09/27/19 1811  . multivitamin with minerals tablet 1 tablet  1 tablet Oral Daily Martyn Malay, MD   1 tablet at 09/27/19 1045  . pneumococcal 23 valent vaccine (PNEUMOVAX-23) injection 0.5 mL  0.5 mL Intramuscular Tomorrow-1000 Hensel, William A, MD      . polyethylene glycol (MIRALAX / GLYCOLAX) packet 17 g  17 g Oral Daily PRN Milus Banister C, DO      . protein supplement (ENSURE MAX) liquid  11 oz Oral Daily Espinoza, Alejandra, DO   11 oz at 09/24/19 1013     Objective: Vital: Vitals:   09/27/19 1556 09/27/19 1745 09/27/19 1939 09/28/19 0033  BP: 101/81 101/81 140/73 (!) 150/70  Pulse: 83 83 75 84  Resp: 18 18 16 14   Temp: 98 F (36.7 C) 98 F (36.7 C) 97.8 F (36.6 C) 97.8 F (36.6 C)  TempSrc: Oral Oral Oral Oral  SpO2:  98%  100% 100%  Weight:  80.5 kg    Height:  5\' 4"  (1.626 m)     I/Os: I/O last 3 completed shifts: In: 33177.3 [P.O.:480; I.V.:637.3; ZOXWR:60454] Out: 33000 [Urine:33000]  Physical Exam:  General: Patient is in no apparent distress Lungs: Normal respiratory effort, chest expands symmetrically. GI:  The abdomen is soft and nontender  Foley: 24Fr 3-way foley on CBI moderate/fast drip with medium pink urine, no clots seen Ext: lower extremities symmetric  Lab Results: Recent Labs    09/26/19 0302 09/26/19 0302 09/27/19 0329 09/27/19 1408 09/28/19 0254  WBC 8.1  --  8.1  --  7.1  HGB 9.3*   < > 8.2* 8.0* 7.6*  HCT 29.6*   < > 26.6* 25.4* 23.5*   < > = values in this interval not displayed.   No results for  input(s): NA, K, CL, CO2, GLUCOSE, BUN, CREATININE, CALCIUM in the last 72 hours. No results for input(s): LABPT, INR in the last 72 hours. No results for input(s): LABURIN in the last 72 hours. Results for orders placed or performed during the hospital encounter of 09/17/19  SARS Coronavirus 2 by RT PCR (hospital order, performed in [facility] hospital lab) Nasopharyngeal Nasopharyngeal Swab     Status: None   Collection Time: 09/17/19 10:35 PM   Specimen: Nasopharyngeal Swab  Result Value Ref Range Status   SARS Coronavirus 2 NEGATIVE NEGATIVE Final    Comment: (NOTE) SARS-CoV-2 target nucleic acids are NOT DETECTED.  The SARS-CoV-2 RNA is generally detectable in upper and lower respiratory specimens during the acute phase of infection. The lowest concentration of SARS-CoV-2 viral copies this assay can detect is 250 copies / mL. A negative result does not preclude SARS-CoV-2 infection and should not be used as the sole basis for treatment or other patient management decisions.  A negative result may occur with improper specimen collection / handling, submission of specimen other than nasopharyngeal swab, presence of viral mutation(s) within the areas targeted by this assay, and inadequate number of viral copies (<250 copies / mL). A negative result must be combined with clinical observations, patient history, and epidemiological information.  Fact Sheet for Patients:   StrictlyIdeas.no  Fact Sheet for Healthcare Providers: BankingDealers.co.za  This test is not yet approved or  cleared by the [place] FDA and has been authorized for detection and/or diagnosis of SARS-CoV-2 by FDA under an Emergency Use Authorization (EUA).  This EUA will remain in effect (meaning this test can be used) for the duration of the COVID-19 declaration under Section 564(b)(1) of the Act, 21 U.S.C. section 360bbb-3(b)(1), unless the authorization is  terminated or revoked sooner.  Performed at [facility], [place] [address] [place], [place] [address]   Culture, Urine     Status: Abnormal   Collection Time: 09/18/19  8:04 PM   Specimen: Urine, Catheterized  Result Value Ref Range Status   Specimen Description URINE, CATHETERIZED  Final   Special Requests   Final    NONE Performed at [facility], [place] [address], [place], [place] [address]    Culture MULTIPLE SPECIES PRESENT, SUGGEST RECOLLECTION (A)  Final   Report Status 09/20/2019 FINAL  Final    Studies/Results: No results found.  Assessment: 69 year old woman with known high-grade T1 urothelial cell carcinoma, CVA and recent bilateral DVTs POD#6 s/p restaging TURBT with intravesical gemcitabine.   Plan: -patient had worsening gross hematuria over the weekend requiring upsize of foley and initiation of  continuous bladder irrigation (CBI) -Hgb has continued to drop now 7.6 this AM -discussed with patient the delicate balance of anticoagulation with gross hematuria -she would like to proceed with cystoscopy, fulguration and possible clot evacuation today -I did discuss with her that if she continues to bleed after this that she may need cessation of heparin/anticoagulation and need IVC filter placed -please keep NPO for procedure this afternoon    Jacalyn Lefevre, MD Urology 09/28/2019, 7:02 AM

## 2019-09-28 NOTE — Anesthesia Postprocedure Evaluation (Signed)
Anesthesia Post Note  Patient: Lisa [name]  Procedure(s) Performed: CYSTOSCOPY WITH FULGERATION AND CLOT EVACUATION (N/A Bladder)     Patient location during evaluation: PACU Anesthesia Type: General Level of consciousness: awake and alert Pain management: pain level controlled Vital Signs Assessment: post-procedure vital signs reviewed and stable Respiratory status: spontaneous breathing, nonlabored ventilation and respiratory function stable Cardiovascular status: blood pressure returned to baseline and stable Postop Assessment: no apparent nausea or vomiting Anesthetic complications: no   No complications documented.  Last Vitals:  Vitals:   09/28/19 1315 09/28/19 1744  BP: (!) 180/78 (!) 160/79  Pulse:  95  Resp:  16  Temp:  36.7 C  SpO2:  96%    Last Pain:  Vitals:   09/28/19 1744  TempSrc:   PainSc: 0-No pain                 Mikhaela Zaugg A.

## 2019-09-28 NOTE — Progress Notes (Signed)
Contacted patient's RN on [address] and communicated instructions from Dr. Claudia Desanctis regarding the importance of keeping patient's bladder irrigation flowing freely and urine as clear as possible, also communicated that patient is okay to be restarted on heparin drip once patient arrives from PACU to the floor.  RN is in agreement with plan.

## 2019-09-28 NOTE — TOC Progression Note (Signed)
Transition of Care [facility]) - Progression Note    Patient Details  Name: [name] MRN: [id number] Date of Birth: Dec 09, 1950  Transition of Care [facility]) CM/SW [place], [place] Phone Number: 09/28/2019, 8:25 AM  Clinical Narrative:    Insurance authorization approved 8/15-8/18. Josem Kaufmann #[id number].  Expected Discharge Plan: [place] Barriers to Discharge: Continued Medical Work up  Expected Discharge Plan and Services Expected Discharge Plan: [place] arrangements for the past 2 months: [place]                                       Social Determinants of Health (SDOH) Interventions    Readmission Risk Interventions No flowsheet data found.

## 2019-09-28 NOTE — Progress Notes (Signed)
  [place] for heparin Indication: new DVT, presumed PE   Labs: Recent Labs    09/25/19 0156 09/25/19 0906 09/26/19 0302 09/26/19 0302 09/27/19 0329 09/27/19 1408 09/27/19 2253  HGB 8.4*   < > 9.3*   < > 8.2* 8.0*  --   HCT 26.6*   < > 29.6*  --  26.6* 25.4*  --   PLT 280  --  344  --  338  --   --   HEPARINUNFRC 0.24*   < > 0.51   < > 0.52 0.53 0.49   < > = values in this interval not displayed.    Assessment: 69 yo female with DVT and presumed PE for heparin    Goal of Therapy:  Heparin level 0.3-0.5 units/ml   Plan:  Continue Heparin at current rate  Phillis Knack, PharmD, BCPS

## 2019-09-28 NOTE — Progress Notes (Signed)
Pt has a BP post transfusion of 197/76 on the Dinamap and manual BP of 180/78. MD notified and aware of this and told to monitor. MD also aware of bleeding around the catheter. MD advised to continue to monitor.

## 2019-09-28 NOTE — Progress Notes (Signed)
  ANTICOAGULATION CONSULT NOTE - Follow-Up  Pharmacy Consult for IV heparin Indication: new bilateral DVT, presumed PE   Labs: Recent Labs    09/26/19 0302 09/26/19 0302 09/27/19 0329 09/27/19 0329 09/27/19 1408 09/27/19 1408 09/27/19 2253 09/28/19 0254 09/28/19 1632  HGB 9.3*   < > 8.2*   < > 8.0*   < >  --  7.6* 7.1*  HCT 29.6*   < > 26.6*   < > 25.4*  --   --  23.5* 21.0*  PLT 344  --  338  --   --   --   --  323  --   HEPARINUNFRC 0.51   < > 0.52   < > 0.53  --  0.49 0.48  --   CREATININE  --   --   --   --   --   --   --   --  0.50   < > = values in this interval not displayed.    Assessment: 69 yo female with complex history including bladder cancer plus hematuria and anemia s/p TURBT procedure 8/10. Pharmacy consulted to dose heparin for treatment of BLE DVTs with presumed PE.    Pt s/p OR today for clot evacuation by urology. Per MD, ok to resume heparin drip post/op after PACU.   Goal of Therapy:  Heparin level 0.3-0.5 units/ml   Plan:  Restart heparin 1150 units/hr Check heparin level with morning labs   Arrie Senate, PharmD, BCPS Clinical Pharmacist [phone] Please check AMION for all [place] numbers 09/28/2019

## 2019-09-28 NOTE — TOC Progression Note (Signed)
Transition of Care [facility]) - Progression Note    Patient Details  Name: Lisa [name] MRN: [id number] Date of Birth: [birth year]  [facility] CM/SW Contact  [name], RN Phone Number: 09/28/2019, 4:10 PM  Clinical Narrative:    CM met with pts SIL today. She is meeting with ALFs tomorrow to plan for a place for the patient after rehab.  CM has reached out to [name] at [facility] and they still dont have any beds available.  Plan continues to be [name]. TOC following.   Expected Discharge Plan: [place] Barriers to Discharge: Continued Medical Work up  Expected Discharge Plan and Services Expected Discharge Plan: [place] arrangements for the past 2 months: [place]                                       Social Determinants of Health (SDOH) Interventions    Readmission Risk Interventions No flowsheet data found.

## 2019-09-28 NOTE — Progress Notes (Signed)
PT Cancellation Note  Patient Details Name: Lisa [name] MRN: [id number] DOB: 1950/08/09   Cancelled Treatment:    Reason Eval/Treat Not Completed: Patient at procedure or test/unavailable  Per RN, pt recently left for bladder procedure.   Arby Barrette, PT Pager [phone]   Rexanne Mano 09/28/2019, 2:51 PM

## 2019-09-28 NOTE — H&P (View-Only) (Signed)
Urology Inpatient Progress Report  Gross hematuria [R31.0] Acute lacunar stroke ([place]) [I63.81] Lacunar infarct, acute ([place]) [I63.81] Symptomatic anemia [D64.9]  Procedure(s): TRANSURETHRAL RESECTION OF BLADDER TUMOR WITH INTRAVESICAL GEMCITABINE  6 Days Post-Op   Intv/Subj: No acute events overnight.  No hand irrigation of foley needed last night.    Principal Problem:   Lacunar infarct, acute ([place]) Active Problems:   CVA (cerebral vascular accident) ([place])   Acute ischemic stroke ([place])   Hypertension   DM (diabetes mellitus), type 2, uncontrolled w/neurologic complication ([place])   Stenosis of carotid artery   Hematuria   Primary papillary carcinoma of bladder (HCC)   Acute blood loss anemia   Aphasia   Palliative care by specialist   DNR (do not resuscitate) discussion   Symptomatic anemia   Weakness generalized   Acute deep vein thrombosis (DVT) of proximal vein of both lower extremities (HCC)  Current Facility-Administered Medications  Medication Dose Route Frequency Provider Last Rate Last Admin  .  stroke: mapping our early stages of recovery book   Does not apply Once Milus Banister C, DO      . 0.9 %  sodium chloride infusion (Manually program via Guardrails IV Fluids)   Intravenous Once Simmons-Robinson, Makiera, MD 10 mL/hr at 09/20/19 1420 Restarted at 09/20/19 1420  . acetaminophen (TYLENOL) tablet 650 mg  650 mg Oral Q6H PRN Milus Banister C, DO   650 mg at 09/18/19 1819   Or  . acetaminophen (TYLENOL) suppository 650 mg  650 mg Rectal Q6H PRN Milus Banister C, DO      . amLODipine (NORVASC) tablet 10 mg  10 mg Oral Daily Simmons-Robinson, Makiera, MD   10 mg at 09/27/19 1045  . aspirin chewable tablet 81 mg  81 mg Oral Daily Martyn Malay, MD   81 mg at 09/27/19 1045  . atorvastatin (LIPITOR) tablet 40 mg  40 mg Oral Daily Martyn Malay, MD   40 mg at 09/27/19 1045  . bisacodyl (DULCOLAX) suppository 10 mg  10 mg Rectal Daily PRN Daisy Floro, DO       . Chlorhexidine Gluconate Cloth 2 % PADS 6 each  6 each Topical Daily Martyn Malay, MD   6 each at 09/27/19 1045  . feeding supplement (ENSURE ENLIVE) (ENSURE ENLIVE) liquid 237 mL  237 mL Oral BID BM Martyn Malay, MD   237 mL at 09/26/19 0908  . heparin ADULT infusion 100 units/mL (25000 units/276mL sodium chloride 0.45%)  1,150 Units/hr Intravenous Continuous Leward Quan, RPH 11.5 mL/hr at 09/27/19 1811 1,150 Units/hr at 09/27/19 1811  . multivitamin with minerals tablet 1 tablet  1 tablet Oral Daily Martyn Malay, MD   1 tablet at 09/27/19 1045  . pneumococcal 23 valent vaccine (PNEUMOVAX-23) injection 0.5 mL  0.5 mL Intramuscular Tomorrow-1000 Hensel, William A, MD      . polyethylene glycol (MIRALAX / GLYCOLAX) packet 17 g  17 g Oral Daily PRN Milus Banister C, DO      . protein supplement (ENSURE MAX) liquid  11 oz Oral Daily Espinoza, Alejandra, DO   11 oz at 09/24/19 1013     Objective: Vital: Vitals:   09/27/19 1556 09/27/19 1745 09/27/19 1939 09/28/19 0033  BP: 101/81 101/81 140/73 (!) 150/70  Pulse: 83 83 75 84  Resp: 18 18 16 14   Temp: 98 F (36.7 C) 98 F (36.7 C) 97.8 F (36.6 C) 97.8 F (36.6 C)  TempSrc: Oral Oral Oral Oral  SpO2:  98%  100% 100%  Weight:  80.5 kg    Height:  5\' 4"  (1.626 m)     I/Os: I/O last 3 completed shifts: In: 33177.3 [P.O.:480; I.V.:637.3; YYTKP:54656] Out: 33000 [Urine:33000]  Physical Exam:  General: Patient is in no apparent distress Lungs: Normal respiratory effort, chest expands symmetrically. GI:  The abdomen is soft and nontender  Foley: 24Fr 3-way foley on CBI moderate/fast drip with medium pink urine, no clots seen Ext: lower extremities symmetric  Lab Results: Recent Labs    09/26/19 0302 09/26/19 0302 09/27/19 0329 09/27/19 1408 09/28/19 0254  WBC 8.1  --  8.1  --  7.1  HGB 9.3*   < > 8.2* 8.0* 7.6*  HCT 29.6*   < > 26.6* 25.4* 23.5*   < > = values in this interval not displayed.   No results for  input(s): NA, K, CL, CO2, GLUCOSE, BUN, CREATININE, CALCIUM in the last 72 hours. No results for input(s): LABPT, INR in the last 72 hours. No results for input(s): LABURIN in the last 72 hours. Results for orders placed or performed during the hospital encounter of 09/17/19  SARS Coronavirus 2 by RT PCR (hospital order, performed in [facility] hospital lab) Nasopharyngeal Nasopharyngeal Swab     Status: None   Collection Time: 09/17/19 10:35 PM   Specimen: Nasopharyngeal Swab  Result Value Ref Range Status   SARS Coronavirus 2 NEGATIVE NEGATIVE Final    Comment: (NOTE) SARS-CoV-2 target nucleic acids are NOT DETECTED.  The SARS-CoV-2 RNA is generally detectable in upper and lower respiratory specimens during the acute phase of infection. The lowest concentration of SARS-CoV-2 viral copies this assay can detect is 250 copies / mL. A negative result does not preclude SARS-CoV-2 infection and should not be used as the sole basis for treatment or other patient management decisions.  A negative result may occur with improper specimen collection / handling, submission of specimen other than nasopharyngeal swab, presence of viral mutation(s) within the areas targeted by this assay, and inadequate number of viral copies (<250 copies / mL). A negative result must be combined with clinical observations, patient history, and epidemiological information.  Fact Sheet for Patients:   StrictlyIdeas.no  Fact Sheet for Healthcare Providers: BankingDealers.co.za  This test is not yet approved or  cleared by the [place] FDA and has been authorized for detection and/or diagnosis of SARS-CoV-2 by FDA under an Emergency Use Authorization (EUA).  This EUA will remain in effect (meaning this test can be used) for the duration of the COVID-19 declaration under Section 564(b)(1) of the Act, 21 U.S.C. section 360bbb-3(b)(1), unless the authorization is  terminated or revoked sooner.  Performed at [facility], [place] [address] [place], [place] [address]   Culture, Urine     Status: Abnormal   Collection Time: 09/18/19  8:04 PM   Specimen: Urine, Catheterized  Result Value Ref Range Status   Specimen Description URINE, CATHETERIZED  Final   Special Requests   Final    NONE Performed at [facility], [place] [address], [place], [place] [address]    Culture MULTIPLE SPECIES PRESENT, SUGGEST RECOLLECTION (A)  Final   Report Status 09/20/2019 FINAL  Final    Studies/Results: No results found.  Assessment: 69 year old woman with known high-grade T1 urothelial cell carcinoma, CVA and recent bilateral DVTs POD#6 s/p restaging TURBT with intravesical gemcitabine.   Plan: -patient had worsening gross hematuria over the weekend requiring upsize of foley and initiation of  continuous bladder irrigation (CBI) -Hgb has continued to drop now 7.6 this AM -discussed with patient the delicate balance of anticoagulation with gross hematuria -she would like to proceed with cystoscopy, fulguration and possible clot evacuation today -I did discuss with her that if she continues to bleed after this that she may need cessation of heparin/anticoagulation and need IVC filter placed -please keep NPO for procedure this afternoon    Jacalyn Lefevre, MD Urology 09/28/2019, 7:02 AM

## 2019-09-28 NOTE — Op Note (Signed)
PATIENT:  Lisa [name]  PRE-OPERATIVE DIAGNOSIS: gross hematuria and clot retention  POST-OPERATIVE DIAGNOSIS: Same  PROCEDURE:  Procedure(s): 1. Cystoscopy, clot evacuation and fulguration  SURGEON:  Jacalyn Lefevre, MD  ANESTHESIA:   General  EBL:    DRAINS: Urethral catheter (24 Fr. Foley 3-way on continuous bladder irrigation)   SPECIMEN:  none  DISPOSITION OF SPECIMEN:  PATHOLOGY  Indication: 69 year old woman with history of CVA, known high-grade T1 urothelial cell carcinoma, and acute bilateral DVTs on heparin who is undergone TURBT x2 with continued gross hematuria and clot urinary retention while on continuous bladder irrigation.  Description of operation: The patient was taken to the operating room and administered general anesthesia. They were then placed on the table and moved to the dorsal lithotomy position after which the genitalia was sterilely prepped and draped. An official timeout was then performed.  The 69 French resectoscope with the 30 lens and visual obturator were then passed into the bladder under direct visualization. Urethra appeared normal. The visual obturator was then removed and the Gyrus resectoscope element with 30  lens was then inserted and the bladder was fully and systematically inspected. Ureteral orifices were noted to be in the normal anatomic positions.   Large clot burden was noted in the bladder which made visualization difficult.  Combination of bipolar electrocautery and Toomey syringe was used to remove all of the clot.  The prior resection bed was then inspected at the posterior bladder wall in the anterior right bladder neck.  No significant active bleeding was seen however the resection bed was friable and shaggy.  Cautery took place of anything that looked friable.  Hemostasis was excellent with the irrigation turned off bilateral ureteral orifice ease were seen at the start and end of the case and uninvolved.  Reinspection of the  bladder revealed all obvious tumor had been fully resected and there was no evidence of perforation.  A 24 French Foley catheter was then inserted in the bladder and irrigated.  Continuous bladder irrigation was initiated on wide-open drip.  The patient was awakened and taken to the recovery room.    PLAN OF CARE: Transfer back to the floor after PACU  PATIENT DISPOSITION:  PACU - hemodynamically stable.

## 2019-09-28 NOTE — Interval H&P Note (Signed)
History and Physical Interval Note: Patient continues to bleed despite CBI.  Discussed with both patient and her POA [name] that we could make one more attempt at cysto, fulguration and clot evac. If bleeding continues then will need to consider IVC filter.   09/28/2019 2:29 PM  Maricela Curet  has presented today for surgery, with the diagnosis of [place].  The various methods of treatment have been discussed with the patient and family. After consideration of risks, benefits and other options for treatment, the patient has consented to  Procedure(s): [place] (N/A) as a surgical intervention.  The patient's history has been reviewed, patient examined, no change in status, stable for surgery.  I have reviewed the patient's chart and labs.  Questions were answered to the patient's satisfaction.     Katura Eatherly D Leightyn Cina

## 2019-09-28 NOTE — Anesthesia Preprocedure Evaluation (Addendum)
Anesthesia Evaluation  Patient identified by MRN, date of birth, ID band Patient awake    Reviewed: Allergy & Precautions, NPO status , Patient's Chart, lab work & pertinent test results  Airway Mallampati: I  TM Distance: >3 FB Neck ROM: Full    Dental   Pulmonary former smoker,    Pulmonary exam normal        Cardiovascular hypertension, Pt. on medications Normal cardiovascular exam     Neuro/Psych CVA    GI/Hepatic negative GI ROS, Neg liver ROS,   Endo/Other  diabetes, Type 2  Renal/GU negative Renal ROS     Musculoskeletal   Abdominal   Peds  Hematology  (+) anemia ,   Anesthesia Other Findings   Reproductive/Obstetrics                             Lab Results  Component Value Date   WBC 7.1 09/28/2019   HGB 7.6 (L) 09/28/2019   HCT 23.5 (L) 09/28/2019   MCV 87.4 09/28/2019   PLT 323 09/28/2019   Lab Results  Component Value Date   CREATININE 0.63 09/23/2019   BUN 8 09/23/2019   NA 136 09/23/2019   K 3.7 09/23/2019   CL 101 09/23/2019   CO2 23 09/23/2019    Anesthesia Physical  Anesthesia Plan  ASA: III  Anesthesia Plan: General   Post-op Pain Management:    Induction: Intravenous  PONV Risk Score and Plan: 3 and Ondansetron, Treatment may vary due to age or medical condition and Midazolam  Airway Management Planned: LMA  Additional Equipment:   Intra-op Plan:   Post-operative Plan: Extubation in OR  Informed Consent: I have reviewed the patients History and Physical, chart, labs and discussed the procedure including the risks, benefits and alternatives for the proposed anesthesia with the patient or authorized representative who has indicated his/her understanding and acceptance.       Plan Discussed with: CRNA and Surgeon  Anesthesia Plan Comments:         Anesthesia Quick Evaluation

## 2019-09-28 NOTE — Anesthesia Procedure Notes (Signed)
Procedure Name: LMA Insertion Date/Time: 09/28/2019 3:52 PM Performed by: Eligha Bridegroom, CRNA Pre-anesthesia Checklist: Patient identified, Emergency Drugs available, Suction available, Patient being monitored and Timeout performed Patient Re-evaluated:Patient Re-evaluated prior to induction Preoxygenation: Pre-oxygenation with 100% oxygen Induction Type: IV induction LMA Size: 4.0 Number of attempts: 1 Placement Confirmation: positive ETCO2 and breath sounds checked- equal and bilateral Tube secured with: Tape Dental Injury: Teeth and Oropharynx as per pre-operative assessment

## 2019-09-28 NOTE — Transfer of Care (Signed)
Immediate Anesthesia Transfer of Care Note  Patient: Lisa [name]  Procedure(s) Performed: CYSTOSCOPY WITH FULGERATION AND CLOT EVACUATION (N/A Bladder)  Patient Location: PACU  Anesthesia Type:General  Level of Consciousness: awake, alert  and oriented  Airway & Oxygen Therapy: Patient Spontanous Breathing  Post-op Assessment: Report given to RN and Post -op Vital signs reviewed and stable  Post vital signs: Reviewed and stable  Last Vitals:  Vitals Value Taken Time  BP 160/79 09/28/19 1744  Temp    Pulse 95 09/28/19 1748  Resp 17 09/28/19 1748  SpO2 98 % 09/28/19 1748  Vitals shown include unvalidated device data.  Last Pain:  Vitals:   09/28/19 1300  TempSrc: Oral  PainSc:       Patients Stated Pain Goal: 0 (84/85/92 7639)  Complications: No complications documented.

## 2019-09-28 NOTE — Progress Notes (Signed)
  ANTICOAGULATION CONSULT NOTE - Follow-Up  Pharmacy Consult for IV heparin Indication: new bilateral DVT, presumed PE   Labs: Recent Labs    09/26/19 0302 09/26/19 0302 09/27/19 0329 09/27/19 0329 09/27/19 1408 09/27/19 2253 09/28/19 0254  HGB 9.3*   < > 8.2*   < > 8.0*  --  7.6*  HCT 29.6*   < > 26.6*  --  25.4*  --  23.5*  PLT 344  --  338  --   --   --  323  HEPARINUNFRC 0.51   < > 0.52   < > 0.53 0.49 0.48   < > = values in this interval not displayed.    Assessment: 69 yo female with complex history including bladder cancer plus hematuria and anemia s/p TURBT procedure 8/10. Pharmacy consulted to dose heparin for treatment of BLE DVTs with presumed PE.    Heparin level remains therapeutic after rate decrease. Hgb down trending with plans for transfusion today.  PLT stable.   Patient with significant hematuria with catheter. Catheter was changed on 8/14.  Plan for OR today for evacuation vs fulguration.  Heparin was stopped ~ 1150 in anticipation of OR ~ 3pm.  Will follow-up after procedure for recommendations regarding resuming anticoagulation.  Goal of Therapy:  Heparin level 0.3-0.5 units/ml   Plan:  Heparin is currently off. Follow-up after OR for resuming anticoag. Monitor for s/sx bleeding and worsening hematuria  [employer], Pharm.D., BCPS Clinical Pharmacist  **Pharmacist phone directory can be found on [email or web address] listed under [place].  09/28/2019 12:23 PM

## 2019-09-28 NOTE — Progress Notes (Signed)
   09/28/19 2000  Provider Notification  Provider Name/Title Dr Claudia Desanctis  Date Provider Notified 09/28/19  Time Provider Notified 1946  Notification Type Call  Notification Reason Other (Comment) (To Clarify rate of CBI, output now claer)  Response See new orders (reduce rate to moderate flow but monitor output)  Date of Provider Response 09/28/19  Time of Provider Response 2000

## 2019-09-28 NOTE — Progress Notes (Addendum)
Family Medicine Teaching Service Daily Progress Note Intern Pager: [phone]  Patient name: Lisa [name] Medical record number: [id number] Date of birth: 04/10/1950 Age: 68 y.o. Gender: female  Primary Care Provider: Patient, No Pcp Per Consultants:  Neurology, vascular surgery, urology Code Status: Full  Pt Overview and Major Events to Date:  09/17/2019 - admitted, received 1u PRBC 09/18/2019 - Transfused2u PRBC 09/19/2019 -Transfused1u PRBC 09/20/2019 - Transfused2U PRBC 09/20/2019 - Bilateral DVTs on vascular US; heparin gtt started 09/22/2019 -TransUrethralResection of[place]adder[place] [place] [facility]; 2 specimen collected  Assessment and Plan: Lisa [name] is a 76 y.ofemale who presented with gross hematuria and blood loss anemia in setting of papillary carcinoma of the bladder. PMH significant for previous ischemic CVA with residual neurological defects, carotid stent placement due to carotid atherosclerosis, HTN, HLD, and type 2 diabetes.  High-grade T1 Urothelial Cell Carcinomas/p TURBT 8/10, stable  Acute blood loss anemia POD #6.Hemoglobin 7.6 this AM, was 8.2. Foley bag with about 2000cc frank hematuria. Remains on heparin gtt for DVTs.Per urology the patient would like to proceed with cystoscopy, fulguration and possible clot evacuation today. Urology recommends that if patient continues to bleed she may need need cessation of heparin/anticoagulation and need IVC filter placed. - Transfusion threshold 8, will transfuse today - f/u H&H post- transfusion - Continue gentle hydration - Appreciate urology recommendations: continue CBI -Continue heparin gtt, monitor hematuria and Hgb closely - Pain control with Tylenol 650 mg - Daily CBC's -ContinuePT/OT/SLP  Acute Bilateral DVTs, stable On heparin gtt. Oxygen saturation 98-100% on room air overnight. Not tachycardic. Asymptomatic.  -Continueheparin gtt - Cardiac monitor -  Monitor pulse oximetry  Acute Lacunar Infarct Multifocal intracranial stenosis, stable Continues to have aphasia, particularly trouble with word finding. Able to answer yes/no appropriately. Working with SLP. -on ASA 81 mgandHeparin gtt - Neurology has signed off. Recommend switch to DOAC on discharge (Eliquis) - Avoid low BP. Goal 403-474 systolic - No indication for TEE as patient will be on long-term anticoagulation for her b/l DVTs - Continue PT/OT/Speech Therapy  - CM working to find SNF placement  Hypertension: chronic, stable AM BP:150/70, within goal range. Home medications: Losartan 25mg , metoprolol 25mg , amlodipine 10mg .Goal systolic 259-563. - Continue amlodipine 10 mg, holding home metoprolol and losartan -Vital signs per floor protocol(recommended avoiding early a.m. vitals as patient politely requesting rest)  Carotid atherosclerosishyperlipidemia: stable Patient s/p left TCAR (08/20/2019) for symptomatic left carotid stenosis. Patient was started on aspirin and Plavixpost-procedure. Vascular USthis admission showed <50% stenosis of left and right carotids. - Continue atorvastatin 40 mg daily -Currently on ASA 81 mgandHeparin gtt, plan to switch to Eliquis once bladder bleeding has decreased  Type 2 DM: chronic, stable HbA1c: 6.5%.  Home medications: Metformin 500mg .  - holding home metformin - Continue to monitor CBGs however with reduced frequency (twice daily, once before breakfast, prior to bedtime) - Patient not requiring any insulin, continue to hold off on sliding scale  Uterine mass on CT Abd/Pelv: stable Patient with uterine mass at fundusmeasuring approximately 8 x 12 x 11 cmon CT scan 8/5. Not able to be visualized on pelvic U/S. Will likely need TVUS once more stable/outpatient follow up. - TVUS likely outpatient one more stable - Outpatient follow up  FEN/GI:NPO OVF:IEPPIRJ gtt  Disposition: SNF pending transition to  DOAC   Subjective:  Patient had bright red blood in urine overnight.  Patient had no complaints this a.m.  Hemoglobin 7.6.  This a.m.  Objective: Temp:  [97.7 F (36.5 C)-98.4 F (  36.9 C)] 97.8 F (36.6 C) (08/16 0033) Pulse Rate:  [73-84] 84 (08/16 0033) Resp:  [14-18] 14 (08/16 0033) BP: (101-150)/(64-81) 150/70 (08/16 0033) SpO2:  [97 %-100 %] 100 % (08/16 0033) Weight:  [80.5 kg] 80.5 kg (08/15 1745) Physical Exam: General: Patient resting comfortably in bed, NAD, not able to answer any orientation questions other than self, pleasant mood Cardiovascular: RRR, no murmur detected Respiratory: Clear and equal bilaterally, good chest expansion Abdomen: Normoactive bowel sounds, no pain to palpation GU: Foley producing frank red blood, 2000 cc in bag, no fresh blood at site of Foley insertion Extremities: Distal pulses intact, no edema  Laboratory: Recent Labs  Lab 09/26/19 0302 09/26/19 0302 09/27/19 0329 09/27/19 1408 09/28/19 0254  WBC 8.1  --  8.1  --  7.1  HGB 9.3*   < > 8.2* 8.0* 7.6*  HCT 29.6*   < > 26.6* 25.4* 23.5*  PLT 344  --  338  --  323   < > = values in this interval not displayed.   Recent Labs  Lab 09/22/19 0323 09/23/19 0513  NA 136 136  K 3.6 3.7  CL 100 101  CO2 21* 23  BUN 8 8  CREATININE 0.63 0.63  CALCIUM 8.4* 8.4*  PROT 5.2*  --   BILITOT 0.8  --   ALKPHOS 79  --   ALT 13  --   AST 17  --   GLUCOSE 103* 111*      Imaging/Diagnostic Tests: No results found. in the past 24 hours  Lisa Busman, MD 09/28/2019, 7:25 AM PGY-1, [place] Intern pager: [phone], text pages welcome

## 2019-09-29 ENCOUNTER — Encounter (HOSPITAL_COMMUNITY): Payer: Self-pay | Admitting: Urology

## 2019-09-29 DIAGNOSIS — E1149 Type 2 diabetes mellitus with other diabetic neurological complication: Secondary | ICD-10-CM | POA: Diagnosis not present

## 2019-09-29 DIAGNOSIS — I6381 Other cerebral infarction due to occlusion or stenosis of small artery: Secondary | ICD-10-CM | POA: Diagnosis not present

## 2019-09-29 DIAGNOSIS — I824Y3 Acute embolism and thrombosis of unspecified deep veins of proximal lower extremity, bilateral: Secondary | ICD-10-CM | POA: Diagnosis not present

## 2019-09-29 DIAGNOSIS — D62 Acute posthemorrhagic anemia: Secondary | ICD-10-CM | POA: Diagnosis not present

## 2019-09-29 LAB — CBC
HCT: 25 % — ABNORMAL LOW (ref 36.0–46.0)
Hemoglobin: 8 g/dL — ABNORMAL LOW (ref 12.0–15.0)
MCH: 28.1 pg (ref 26.0–34.0)
MCHC: 32 g/dL (ref 30.0–36.0)
MCV: 87.7 fL (ref 80.0–100.0)
Platelets: 289 10*3/uL (ref 150–400)
RBC: 2.85 MIL/uL — ABNORMAL LOW (ref 3.87–5.11)
RDW: 16.8 % — ABNORMAL HIGH (ref 11.5–15.5)
WBC: 8.7 10*3/uL (ref 4.0–10.5)
nRBC: 0.2 % (ref 0.0–0.2)

## 2019-09-29 LAB — HEPARIN LEVEL (UNFRACTIONATED)
Heparin Unfractionated: 0.19 IU/mL — ABNORMAL LOW (ref 0.30–0.70)
Heparin Unfractionated: 0.42 IU/mL (ref 0.30–0.70)

## 2019-09-29 MED ORDER — BOOST / RESOURCE BREEZE PO LIQD CUSTOM
1.0000 | Freq: Two times a day (BID) | ORAL | Status: DC
  Administered 2019-09-29 – 2019-10-01 (×3): 1 via ORAL

## 2019-09-29 MED ORDER — APIXABAN 5 MG PO TABS
5.0000 mg | ORAL_TABLET | Freq: Two times a day (BID) | ORAL | Status: DC
  Administered 2019-09-29 – 2019-10-02 (×8): 5 mg via ORAL
  Filled 2019-09-29 (×8): qty 1

## 2019-09-29 MED ORDER — LOSARTAN POTASSIUM 25 MG PO TABS: 12.5000 mg | ORAL_TABLET | Freq: Every day | ORAL | Status: DC

## 2019-09-29 NOTE — Progress Notes (Addendum)
Family Medicine Teaching Service Daily Progress Note Intern Pager: [phone]  Patient name: Lisa [name] Medical record number: [id number] Date of birth: 1950-07-30 Age: 69 y.o. Gender: female  Primary Care Provider: Patient, No Pcp Per Consultants:  Neurology, vascular surgery, urology Code Status: Full  Pt Overview and Major Events to Date:  09/17/2019 - admitted, received 1u PRBC 09/18/2019 - Transfused2u PRBC 09/19/2019 -Transfused1u PRBC 09/20/2019 - Transfused2U PRBC 09/20/2019 - Bilateral DVTs on vascular US; heparin gtt started 09/22/2019 -[place] [facility] [facility]; 2 specimen collected 8/16: received 2 units pRBCs, OR for irrigation and removal clot   Assessment and Plan: Lisa [name] is a 69 y.ofemale who presented with gross hematuria and blood loss anemia in setting of papillary carcinoma of the bladder. PMH significant for previous ischemic CVA with residual neurological defects, carotid stent placement due to carotid atherosclerosis, HTN, HLD, and type 2 diabetes.  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 Patient received 2 units overnight for hemoglobin that dropped below 8. Patient underwent bladder fulguration on 8/16. Urine remains clear this morning, bladder irrigation overnight. Urology planning to wean continuous bladder irrigation and plan for voiding trial. Remains on heparin gtt for DVTs. Urology recommends that if patient continues to bleed she may need need cessation of heparin/anticoagulation and need IVC filter placed. - Transfusion threshold 8,hemoglobin at threshold  - Continue gentle hydration - Appreciate urology recommendations: continue CBI with plan to wean  -plan to transition to eliquis & d/c hep gtt  - Pain control with Tylenol 650 mg - Daily CBC's -ContinuePT/OT/SLP  Acute Bilateral  DVTs, stable - plan to transition from hep gtt to Eliquis 5mg  BID today  - Cardiac monitor - Monitor pulse oximetry  Acute Lacunar Infarct Multifocal intracranial stenosis, stable Patient with acute infarct recently. No new neurological deficits  -on ASA 81 mg, will transition hep gtt to DOAC  - Avoid low BP. Goal 027-741 systolic - No indication for TEE as patient will be on long-term anticoagulation for her b/l DVTs - Continue PT/OT/Speech Therapy  - anticipate SNF placement at [facility] place pending stability of clear urine   Hypertension: chronic, stable AM BP: 144/63. Home medications: Losartan 25mg , metoprolol 25mg , amlodipine 10mg .Goal systolic [phone]. - Continue amlodipine 10 mg, holding home metoprolol and losartan -Vital signs per floor protocol(recommended avoiding early a.m. vitals as patient politely requesting rest)  Carotid atherosclerosishyperlipidemia: stable Patient s/p left TCAR (08/20/2019) for symptomatic left carotid stenosis. Patient was started on aspirin and Plavixpost-procedure. Vascular USthis admission showed <50% stenosis of left and right carotids. - Continue atorvastatin 40 mg daily -Currently on ASA 81 mg  Type 2 DM: chronic, stable HbA1c: 6.5%.  Home medications: Metformin 500mg .  - holding home metformin - Continue to monitor CBGs however with reduced frequency (twice daily, once before breakfast, prior to bedtime) - Patient not requiring any insulin, continue to hold off on sliding scale  Uterine mass on CT Abd/Pelv: stable Patient with uterine mass at fundusmeasuring approximately 8 x 12 x 11 cmon CT scan 8/5. Not able to be visualized on pelvic U/S. Will likely need TVUS once more stable/outpatient follow up. - TVUS likely outpatient one more stable - Outpatient follow up  FEN/GI:NPO EHM:CNOBSJG gtt>transition to PO   Disposition: SNF pending transition to DOAC  Subjective:  Patient had bleeding at site of catheter  overnight, received 2 units of blood as Hgb dropped to 7.1. Patient asymptomatic. This morning, patient  reports no abdominal pain, no chest pain,   Objective: Temp:  [97.9 F (36.6 C)-98.5 F (36.9 C)] 98.3 F (36.8 C) (08/17 0742) Pulse Rate:  [76-95] 83 (08/17 0742) Resp:  [16-18] 18 (08/17 0742) BP: (140-197)/(63-81) 144/63 (08/17 0742) SpO2:  [94 %-100 %] 99 % (08/17 0742)   Physical Exam: General: Patient resting comfortably in bed, NAD Cardiovascular: RRR  Respiratory: no wheezing or crackles noted, normal respiratory effort on RA  Abdomen: Normoactive bowel sounds, no pain to palpation, no abdominal distention  GU: urine clear  Extremities: no LE edema   Laboratory: Recent Labs  Lab 09/27/19 0329 09/27/19 1408 09/28/19 0254 09/28/19 0254 09/28/19 1632 09/28/19 1836 09/29/19 0145  WBC 8.1  --  7.1  --   --   --  8.7  HGB 8.2*   < > 7.6*   < > 7.1* 10.1* 8.0*  HCT 26.6*   < > 23.5*   < > 21.0* 31.0* 25.0*  PLT 338  --  323  --   --   --  289   < > = values in this interval not displayed.   Recent Labs  Lab 09/23/19 0513 09/28/19 1632  NA 136 139  K 3.7 3.7  CL 101 98  CO2 23  --   BUN 8 8  CREATININE 0.63 0.50  CALCIUM 8.4*  --   GLUCOSE 111* 141*    Imaging/Diagnostic Tests: No results found. in the past 24 hours   Lisa Foster, MD 09/29/2019, 9:06 AM PGY-1, [place] Intern pager: [phone], text pages welcome

## 2019-09-29 NOTE — Progress Notes (Signed)
Urology Inpatient Progress Report  Gross hematuria [R31.0] Acute lacunar stroke ([place]) [I63.81] Lacunar infarct, acute ([place]) [I63.81] Symptomatic anemia [D64.9]    Intv/Subj: No issues with foley overnight.  Patient received 1unit RBCs and Hgb stable at 8 post transfusion.  No issues with CBI or foley overnight.   Principal Problem:   Lacunar infarct, acute ([place]) Active Problems:   CVA (cerebral vascular accident) ([place])   Acute ischemic stroke ([place])   Hypertension   DM (diabetes mellitus), type 2, uncontrolled w/neurologic complication ([place])   Stenosis of carotid artery   Hematuria   Primary papillary carcinoma of bladder (HCC)   Acute blood loss anemia   Aphasia   Palliative care by specialist   DNR (do not resuscitate) discussion   Symptomatic anemia   Weakness generalized   Acute deep vein thrombosis (DVT) of proximal vein of both lower extremities (HCC)  Current Facility-Administered Medications  Medication Dose Route Frequency Provider Last Rate Last Admin  .  stroke: mapping our early stages of recovery book   Does not apply Once Milus Banister C, DO      . 0.9 %  sodium chloride infusion (Manually program via Guardrails IV Fluids)   Intravenous Once Simmons-Robinson, Makiera, MD 10 mL/hr at 09/20/19 1420 Restarted at 09/20/19 1420  . 0.9 %  sodium chloride infusion (Manually program via Guardrails IV Fluids)   Intravenous Once Simmons-Robinson, Makiera, MD      . 0.9 %  sodium chloride infusion (Manually program via Guardrails IV Fluids)   Intravenous Once Simmons-Robinson, Makiera, MD      . acetaminophen (TYLENOL) tablet 650 mg  650 mg Oral Q6H PRN Milus Banister C, DO   650 mg at 09/18/19 1819   Or  . acetaminophen (TYLENOL) suppository 650 mg  650 mg Rectal Q6H PRN Milus Banister C, DO      . amLODipine (NORVASC) tablet 10 mg  10 mg Oral Daily Simmons-Robinson, Makiera, MD   10 mg at 09/27/19 1045  . aspirin chewable tablet 81 mg  81 mg Oral Daily Martyn Malay, MD   81 mg at 09/27/19 1045  . atorvastatin (LIPITOR) tablet 40 mg  40 mg Oral Daily Martyn Malay, MD   40 mg at 09/27/19 1045  . bisacodyl (DULCOLAX) suppository 10 mg  10 mg Rectal Daily PRN Daisy Floro, DO      . Chlorhexidine Gluconate Cloth 2 % PADS 6 each  6 each Topical Daily Martyn Malay, MD   6 each at 09/27/19 1045  . feeding supplement (ENSURE ENLIVE) (ENSURE ENLIVE) liquid 237 mL  237 mL Oral BID BM Martyn Malay, MD   237 mL at 09/26/19 0908  . heparin ADULT infusion 100 units/mL (25000 units/260mL sodium chloride 0.45%)  1,150 Units/hr Intravenous Continuous Einar Grad, RPH 11.5 mL/hr at 09/28/19 2036 1,150 Units/hr at 09/28/19 2036  . multivitamin with minerals tablet 1 tablet  1 tablet Oral Daily Martyn Malay, MD   1 tablet at 09/27/19 1045  . pneumococcal 23 valent vaccine (PNEUMOVAX-23) injection 0.5 mL  0.5 mL Intramuscular Tomorrow-1000 Hensel, William A, MD      . polyethylene glycol (MIRALAX / GLYCOLAX) packet 17 g  17 g Oral Daily PRN Milus Banister C, DO      . protein supplement (ENSURE MAX) liquid  11 oz Oral Daily Espinoza, Alejandra, DO   11 oz at 09/24/19 1013     Objective: Vital: Vitals:   09/28/19 1815 09/28/19 1918 09/28/19  2323 09/29/19 0416  BP: (!) 154/72 (!) 152/72 (!) 148/68 (!) 144/65  Pulse: 92 82 76 80  Resp: 17 16 16 16   Temp:  97.9 F (36.6 C) 98 F (36.7 C) 98.3 F (36.8 C)  TempSrc:  Oral    SpO2: 97% 97% 98% 99%  Weight:      Height:       I/Os: I/O last 3 completed shifts: In: 66440 [P.O.:360; I.V.:750; HKVQQ:59563] Out: 87564 [Urine:39600; Blood:50]  Physical Exam:  General: Patient is in no apparent distress Lungs: Normal respiratory effort, chest expands symmetrically. GI: The abdomen is soft and nontender Foley: 24Fr 3 way foley with CBI at very slow drip, urine clear in tubing  Ext: lower extremities symmetric  Lab Results: Recent Labs    09/27/19 0329 09/27/19 1408 09/28/19 0254  09/28/19 0254 09/28/19 1632 09/28/19 1836 09/29/19 0145  WBC 8.1  --  7.1  --   --   --  8.7  HGB 8.2*   < > 7.6*   < > 7.1* 10.1* 8.0*  HCT 26.6*   < > 23.5*   < > 21.0* 31.0* 25.0*   < > = values in this interval not displayed.   Recent Labs    09/28/19 1632  NA 139  K 3.7  CL 98  GLUCOSE 141*  BUN 8  CREATININE 0.50   No results for input(s): LABPT, INR in the last 72 hours. No results for input(s): LABURIN in the last 72 hours. Results for orders placed or performed during the hospital encounter of 09/17/19  SARS Coronavirus 2 by RT PCR (hospital order, performed in [facility] hospital lab) Nasopharyngeal Nasopharyngeal Swab     Status: None   Collection Time: 09/17/19 10:35 PM   Specimen: Nasopharyngeal Swab  Result Value Ref Range Status   SARS Coronavirus 2 NEGATIVE NEGATIVE Final    Comment: (NOTE) SARS-CoV-2 target nucleic acids are NOT DETECTED.  The SARS-CoV-2 RNA is generally detectable in upper and lower respiratory specimens during the acute phase of infection. The lowest concentration of SARS-CoV-2 viral copies this assay can detect is 250 copies / mL. A negative result does not preclude SARS-CoV-2 infection and should not be used as the sole basis for treatment or other patient management decisions.  A negative result may occur with improper specimen collection / handling, submission of specimen other than nasopharyngeal swab, presence of viral mutation(s) within the areas targeted by this assay, and inadequate number of viral copies (<250 copies / mL). A negative result must be combined with clinical observations, patient history, and epidemiological information.  Fact Sheet for Patients:   StrictlyIdeas.no  Fact Sheet for Healthcare Providers: BankingDealers.co.za  This test is not yet approved or  cleared by the [place] FDA and has been authorized for detection and/or diagnosis of SARS-CoV-2  by FDA under an Emergency Use Authorization (EUA).  This EUA will remain in effect (meaning this test can be used) for the duration of the COVID-19 declaration under Section 564(b)(1) of the Act, 21 U.S.C. section 360bbb-3(b)(1), unless the authorization is terminated or revoked sooner.  Performed at [facility], [place] [address] [place], [place] [address]   Culture, Urine     Status: Abnormal   Collection Time: 09/18/19  8:04 PM   Specimen: Urine, Catheterized  Result Value Ref Range Status   Specimen Description URINE, CATHETERIZED  Final   Special Requests   Final    NONE Performed at [facility], [address] [place], [place] [address]    Culture MULTIPLE SPECIES PRESENT, SUGGEST RECOLLECTION (A)  Final   Report Status 09/20/2019 FINAL  Final    Studies/Results: No results found.  Assessment: 69 year old woman with history of CVA, known high-grade T1 urothelial cell carcinoma, and acute bilateral DVTs on heparin who is undergone TURBT x2 with continued gross hematuria and clot urinary retention while on continuous bladder irrigation now POD1 s/p cystoscopy, clot evacuation, and fulguration.  Cysto, clot evac, TURBT 7/20 Cysto, clot evac, re-staging TURBT 8/10 Cysto, clot evac, fulguration 8/16  -wean CBI if urine remains clear; if able to shut off monitor urine closely -if patient develops gross hematuria, restart CBI immediately -will consider void trial today/tomorrow based on urine and patient willingness to attempt to void   Jacalyn Lefevre, MD Urology 09/29/2019, 5:57 AM

## 2019-09-29 NOTE — Discharge Instructions (Signed)
Information on my medicine - ELIQUIS (apixaban)  This medication education was reviewed with me or my healthcare representative as part of my discharge preparation.  The pharmacist that spoke with me during my hospital stay was:  Llana Aliment, RPH  Why was Eliquis prescribed for you? Eliquis was prescribed for you to reduce the risk of forming blood clots that can cause a stroke if you have a medical condition called atrial fibrillation (a type of irregular heartbeat) OR to reduce the risk of a blood clots forming after orthopedic surgery.  What do You need to know about Eliquis ? Take your Eliquis TWICE DAILY - one tablet in the morning and one tablet in the evening with or without food.  It would be best to take the doses about the same time each day.  If you have difficulty swallowing the tablet whole please discuss with your pharmacist how to take the medication safely.  Take Eliquis exactly as prescribed by your doctor and DO NOT stop taking Eliquis without talking to the doctor who prescribed the medication.  Stopping may increase your risk of developing a new clot or stroke.  Refill your prescription before you run out.  After discharge, you should have regular check-up appointments with your healthcare provider that is prescribing your Eliquis.  In the future your dose may need to be changed if your kidney function or weight changes by a significant amount or as you get older.  What do you do if you miss a dose? If you miss a dose, take it as soon as you remember on the same day and resume taking twice daily.  Do not take more than one dose of ELIQUIS at the same time.  Important Safety Information A possible side effect of Eliquis is bleeding. You should call your healthcare provider right away if you experience any of the following: ? Bleeding from an injury or your nose that does not stop. ? Unusual colored urine (red or dark brown) or unusual colored stools (red or  black). ? Unusual bruising for unknown reasons. ? A serious fall or if you hit your head (even if there is no bleeding).  Some medicines may interact with Eliquis and might increase your risk of bleeding or clotting while on Eliquis. To help avoid this, consult your healthcare provider or pharmacist prior to using any new prescription or non-prescription medications, including herbals, vitamins, non-steroidal anti-inflammatory drugs (NSAIDs) and supplements.  This website has more information on Eliquis (apixaban): [email or web address].no.

## 2019-09-29 NOTE — Progress Notes (Signed)
Nutrition Follow-up  DOCUMENTATION CODES:   Obesity unspecified  INTERVENTION:  D/c Ensure Enlive  D/c Ensure Max  Mighty Shake po BID, each supplement provides 330 kcals and 9 grams of protein  Boost Breeze po BID, each supplement provides 250 kcal and 9 grams of protein  Encourage adequate PO intake  Family to bring food from home  Continue MVI daily   NUTRITION DIAGNOSIS:   Inadequate oral intake related to poor appetite as evidenced by per patient/family report.  Ongoing  GOAL:   Patient will meet greater than or equal to 90% of their needs  Not met  MONITOR:   PO intake, Supplement acceptance, Diet advancement, Skin, Weight trends, Labs, I & O's  REASON FOR ASSESSMENT:   Consult Assessment of nutrition requirement/status  ASSESSMENT:   Pt presenting with gross hematuria and symptomatic anemic 2/2 papillary carcinoma of the bladder. 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  PMH significant for recent ischemic CVA, recent carotid stent placement, HTN, HLD, DM2.  Pt with high-grade T1 urothelial cell carcinoma.   8/5 admit 8/6 transfused 2u PRBC 8/7 transfused 1u PRBC 8/8 transfused 2u PRBC; bilateral DVTs on vascular US; heparin gtt started 8/10 s/p TURBT with intravesical gemcitabine 8/16 s/p cystoscopy   Pt states that she has had a poor appetite for the last 2 weeks and has not been able to eat much at all. Pt states her appetite is improving now and pt thinks she will be back to baseline within one week. Pt's baseline consists of a very light breakfast, snacks throughout the day, and a balanced lunch and dinner.  Pt states that she has not been drinking either Ensure product because she does not like them. Discussed importance of adequate protein intake. Pt requesting milkshake instead. Will order Mighty Shake BID. Pt also agreeable to trying Boost Breeze. Pt's husband is also planning to bring pt food from home to  increase po intake.   PO Intake: 0-50% x6 recorded meals (28% average meal intake)  Labs reviewed. Medications: Ensure Enlive po BID, MVI, Ensure Max daily  NUTRITION - FOCUSED PHYSICAL EXAM:    Most Recent Value  Orbital Region No depletion  Upper Arm Region Mild depletion  Thoracic and Lumbar Region No depletion  Buccal Region No depletion  Temple Region No depletion  Clavicle Bone Region No depletion  Clavicle and Acromion Bone Region Mild depletion  Scapular Bone Region No depletion  Dorsal Hand No depletion  Patellar Region Moderate depletion  Anterior Thigh Region Moderate depletion  Posterior Calf Region Moderate depletion  Edema (RD Assessment) None  Hair Reviewed  Eyes Reviewed  Mouth Reviewed  Skin Reviewed  Nails Reviewed       Diet Order:   Diet Order            Diet Heart Room service appropriate? Yes; Fluid consistency: Thin  Diet effective now                 EDUCATION NEEDS:   Not appropriate for education at this time  Skin:  Skin Assessment: Skin Integrity Issues: Skin Integrity Issues:: Stage II, Incisions Stage II: second toe R foot Incisions: perineum, vagina, neck  Last BM:  8/15  Height:   Ht Readings from Last 1 Encounters:  09/27/19 5\' 4"  (1.626 m)    Weight:   Wt Readings from Last 1 Encounters:  09/27/19 80.5 kg    BMI:  Body mass index is 30.46 kg/m.  Estimated Nutritional Needs:   Kcal:  1900-2100  Protein:  95-105 grams  Fluid:  >/= 1.9L/d    Larkin Ina, MS, RD, LDN RD pager number and weekend/on-call pager number located in [place].

## 2019-09-29 NOTE — Progress Notes (Signed)
Occupational Therapy Treatment Patient Details Name: Lisa [name] MRN: [id number] DOB: [birth year] Today's Date: 09/29/2019    History of present illness 69 year old with history of cerebrovascular disease with carotid disease s/p endovascular stent placement on 7/8, invasive papillary carcinoma of bladder, type 2 diabetes, memory impairment presenting with acute symptomatic bleeding due to urinary tract bleeding. AMS CT Head Significant for a lacunar infarct within the left lentiform nucleus; MRI brain revealed a 10 mm acute infarct in the posterior medial left thalamus    OT comments  Patient continues to make steady progress towards goals in skilled OT session. Patient's session encompassed sequencing of tasks and continued focus on expressive deficits in session. Pt adamant she did not want to get out of bed or to the side of the bed, but willing to work on cognition. Pt able to state she wants to go to the SNF in [place] without any assistance however continues to need singing, repetition, or other tactics in order to name the month, her name, or other points of conversation. Pt with improved ability to sing the ABCs and Happy Birthday (once prompted) and after a few repetitions was able to complete the last half without assist. Discharge remains appropriate; will continue to follow acutely.    Follow Up Recommendations  SNF;Home health OT;Supervision/Assistance - 24 hour (pending progress)    Equipment Recommendations  None recommended by OT    Recommendations for Other Services      Precautions / Restrictions Precautions Precautions: Fall Restrictions Weight Bearing Restrictions: No       Mobility Bed Mobility               General bed mobility comments: Pt adamant on laying in bed in session  Transfers                      Balance                                           ADL either performed or assessed with clinical judgement    ADL Overall ADL's : Needs assistance/impaired                                     Functional mobility during ADLs: Minimal assistance;Cueing for safety;Cueing for sequencing General ADL Comments: Session focus on cognition and word finding to aid in increased independence     Vision       Perception     Praxis      Cognition Arousal/Alertness: Awake/alert Behavior During Therapy: Restless Overall Cognitive Status: No family/caregiver present to determine baseline cognitive functioning Area of Impairment: Attention;Memory;Following commands;Safety/judgement;Awareness;Problem solving;Orientation                 Orientation Level: Disoriented to;Situation;Time Current Attention Level: Sustained Memory: Decreased short-term memory;Decreased recall of precautions Following Commands: Follows one step commands consistently;Follows one step commands with increased time Safety/Judgement: Decreased awareness of safety;Decreased awareness of deficits Awareness: Emergent Problem Solving: Slow processing;Requires verbal cues;Difficulty sequencing General Comments: expressive difficulties are improving but needs cues to slow down        Exercises     Shoulder Instructions       General Comments      Pertinent Vitals/ Pain       Pain Assessment:  Faces Faces Pain Scale: No hurt  Home Living                                          Prior Functioning/Environment              Frequency  Min 2X/week        Progress Toward Goals  OT Goals(current goals can now be found in the care plan section)  Progress towards OT goals: Progressing toward goals  Acute Rehab OT Goals Patient Stated Goal: to keep practicing OT Goal Formulation: With patient Time For Goal Achievement: 10/04/19 Potential to Achieve Goals: Good  Plan Discharge plan remains appropriate    Co-evaluation                 AM-PAC OT "6 Clicks" Daily  Activity     Outcome Measure   Help from another person eating meals?: A Little Help from another person taking care of personal grooming?: A Little Help from another person toileting, which includes using toliet, bedpan, or urinal?: A Lot Help from another person bathing (including washing, rinsing, drying)?: A Lot Help from another person to put on and taking off regular upper body clothing?: A Little Help from another person to put on and taking off regular lower body clothing?: A Lot 6 Click Score: 15    End of Session    OT Visit Diagnosis: Unsteadiness on feet (R26.81);Muscle weakness (generalized) (M62.81);Other symptoms and signs involving the nervous system (R29.898);Other symptoms and signs involving cognitive function   Activity Tolerance Patient tolerated treatment well   Patient Left in bed;with bed alarm set;with call bell/phone within reach   Nurse Communication Mobility status        Time: 6767-2094 OT Time Calculation (min): 24 min  Charges: OT General Charges $OT Visit: 1 Visit OT Treatments $Self Care/Home Management : 23-37 mins  [place]. Lateef Juncaj, COTA/L Acute Rehabilitation Services [phone] [place] 09/29/2019, 2:11 PM

## 2019-09-29 NOTE — Progress Notes (Signed)
Physical Therapy Treatment Patient Details Name: Lisa [name] MRN: [id number] DOB: 06-04-1950 Today's Date: 09/29/2019    History of Present Illness Pt is 69 year old with history of cerebrovascular disease with carotid disease s/p endovascular stent placement on 7/8, invasive papillary carcinoma of bladder, type 2 diabetes, memory impairment presenting with urinary tract bleeding and AMS. CT Head Significant for a lacunar infarct within the left lentiform nucleus; MRI brain revealed a 10 mm acute infarct in the posterior medial left thalamus.  Additionally, pt found to have bil LE DVT on 09/20/19 and on heparin.  Also, has received multiple units of PRBC, TransUrethral Resection of Bladder Tumor (TURBT)  on 09/22/19, and back to OR on 09/28/19 for Cystoscopy, clot evacuation and fulguration.    PT Comments    Pt making gradual progress.  She does need cues for sequencing and education/encouragment on importance of activity to maintain mobility/strength as well as preventing further blood clots and PNE. Pt with mildly unsteady with gait and transfers requiring min A.  Cont POC.     Follow Up Recommendations  SNF;Supervision/Assistance - 24 hour     Equipment Recommendations  None recommended by PT    Recommendations for Other Services       Precautions / Restrictions Precautions Precautions: Fall Restrictions Weight Bearing Restrictions: No    Mobility  Bed Mobility Overal bed mobility: Needs Assistance Bed Mobility: Supine to Sit     Supine to sit: Min guard;HOB elevated Sit to supine: Min guard;HOB elevated   General bed mobility comments: Pt adamant on laying in bed in session  Transfers Overall transfer level: Needs assistance Equipment used: Rolling walker (2 wheeled) Transfers: Sit to/from Stand Sit to Stand: Min assist         General transfer comment: 2 attempts to stand; cues for safe hand placement; min A to boost  Ambulation/Gait Ambulation/Gait  assistance: Min guard Gait Distance (Feet): 40 Feet Assistive device: Rolling walker (2 wheeled) Gait Pattern/deviations: Step-through pattern;Decreased stride length;Drifts right/left Gait velocity: decreased   General Gait Details: Refused to ambulate  in hall; used RW to hold catheter bag today; cues for safety, posture, RW use, and lines   Stairs             Wheelchair Mobility    Modified Rankin (Stroke Patients Only) Modified Rankin (Stroke Patients Only) Pre-Morbid Rankin Score: Moderately severe disability Modified Rankin: Moderately severe disability     Balance Overall balance assessment: Needs assistance Sitting-balance support: Feet supported;No upper extremity supported Sitting balance-Leahy Scale: Good Sitting balance - Comments: sat EOB to brush teeth   Standing balance support: No upper extremity supported;During functional activity Standing balance-Leahy Scale: Fair Standing balance comment: static stand without UE; unsteady gait used RW                            Cognition Arousal/Alertness: Awake/alert Behavior During Therapy: Agitated Overall Cognitive Status: No family/caregiver present to determine baseline cognitive functioning Area of Impairment: Attention;Memory;Following commands;Safety/judgement;Awareness;Problem solving;Orientation                 Orientation Level: Disoriented to;Situation;Time Current Attention Level: Sustained Memory: Decreased short-term memory;Decreased recall of precautions Following Commands: Follows one step commands consistently;Follows one step commands with increased time Safety/Judgement: Decreased awareness of safety;Decreased awareness of deficits Awareness: Emergent Problem Solving: Slow processing;Requires verbal cues;Difficulty sequencing;Decreased initiation;Requires tactile cues General Comments: Pt with expressive difficulties/word finding issues at times.  Unable to follow multistep  commands.  Difficulty figuring out how to use toothbrush - asked "how does it work?" - (pt seems to use electronic one at home ; still had difficutly when told not electric and have to brush teeth);  Pt expressing wanting to be left alone for just a day, but reluctantly agreeable to PT when educated on benefits.      Exercises      General Comments        Pertinent Vitals/Pain Pain Assessment: No/denies pain Faces Pain Scale: No hurt    Home Living                      Prior Function            PT Goals (current goals can now be found in the care plan section) Acute Rehab PT Goals Patient Stated Goal: get some rest PT Goal Formulation: With patient Time For Goal Achievement: 10/13/19 Potential to Achieve Goals: Good Progress towards PT goals: Progressing toward goals    Frequency    Min 3X/week      PT Plan Current plan remains appropriate    Co-evaluation              AM-PAC PT "6 Clicks" Mobility   Outcome Measure  Help needed turning from your back to your side while in a flat bed without using bedrails?: A Little Help needed moving from lying on your back to sitting on the side of a flat bed without using bedrails?: A Little Help needed moving to and from a bed to a chair (including a wheelchair)?: A Little Help needed standing up from a chair using your arms (e.g., wheelchair or bedside chair)?: A Little Help needed to walk in hospital room?: A Little Help needed climbing 3-5 steps with a railing? : A Lot 6 Click Score: 17    End of Session Equipment Utilized During Treatment: Gait belt Activity Tolerance: Patient tolerated treatment well Patient left: with call bell/phone within reach;in bed;with bed alarm set (refused SCDs) Nurse Communication: Mobility status PT Visit Diagnosis: Difficulty in walking, not elsewhere classified (R26.2);Other symptoms and signs involving the nervous system (R29.898)     Time: 2010-0712 PT Time  Calculation (min) (ACUTE ONLY): 25 min  Charges:  $Gait Training: 8-22 mins $Therapeutic Activity: 8-22 mins                     Abran Richard, PT Acute Rehab Services Pager [phone] [facility] [place] 09/29/2019, 4:40 PM

## 2019-09-29 NOTE — Progress Notes (Signed)
PT Cancellation Note  Patient Details Name: Lisa [name] MRN: [id number] DOB: 18-Feb-1950   Cancelled Treatment:    Reason Eval/Treat Not Completed: Patient declined, no reason specified Pt stated "Some other time, I just want to rest," as soon as PT walked in door.  Tried to encourage but pt denied therapy at this time. Will f/u as able.   Mikael Spray Ainslee Sou 09/29/2019, 2:40 PM

## 2019-09-30 LAB — CBC
HCT: 22.5 % — ABNORMAL LOW (ref 36.0–46.0)
Hemoglobin: 7.1 g/dL — ABNORMAL LOW (ref 12.0–15.0)
MCH: 27.7 pg (ref 26.0–34.0)
MCHC: 31.6 g/dL (ref 30.0–36.0)
MCV: 87.9 fL (ref 80.0–100.0)
Platelets: 260 10*3/uL (ref 150–400)
RBC: 2.56 MIL/uL — ABNORMAL LOW (ref 3.87–5.11)
RDW: 17 % — ABNORMAL HIGH (ref 11.5–15.5)
WBC: 7.8 10*3/uL (ref 4.0–10.5)
nRBC: 0.3 % — ABNORMAL HIGH (ref 0.0–0.2)

## 2019-09-30 LAB — PREPARE RBC (CROSSMATCH)

## 2019-09-30 MED ORDER — LOSARTAN POTASSIUM 25 MG PO TABS: 12.5000 mg | ORAL_TABLET | Freq: Every day | ORAL | Status: DC

## 2019-09-30 MED ORDER — SODIUM CHLORIDE 0.9% IV SOLUTION: Freq: Once | INTRAVENOUS | Status: AC

## 2019-09-30 NOTE — Progress Notes (Signed)
Foley catheter removed at 1144.

## 2019-09-30 NOTE — Progress Notes (Signed)
Family Medicine Teaching Service Daily Progress Note Intern Pager: [phone]  Patient name: Lisa [name] Medical record number: [id number] Date of birth: Jun 08, 1950 Age: 69 y.o. Gender: female  Primary Care Provider: Patient, No Pcp Per Consultants: Neurology, vascular surgery, urology Code Status: FULL  Pt Overview and Major Events to Date:  09/17/2019 - admitted, received 1u PRBC 09/18/2019 - Transfused2u PRBC 09/19/2019 -Transfused1u PRBC 09/20/2019 - Transfused2U PRBC 09/20/2019 - Bilateral DVTs on vascular US; heparin gtt started 09/22/2019 -[facility]; 2 specimen collected 8/16: received 2 units pRBCs, OR for irrigation and removal clot  8/18: received 1 U pRBCs  Assessment and Plan: Lisa [name] is a 69 y.ofemale who presented with gross hematuria and blood loss anemia in setting of papillary carcinoma of the bladder. PMH significant for previous ischemic CVA with residual neurological defects, carotid stent placement due to carotid atherosclerosis, HTN, HLD, and type 2 diabetes.  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 Patient started on 1U pRBC early morning.  400 cc amber urine.  Urology planning to wean continuous bladder irrigation and plan for voiding trial.  Allergy would like patient to have bedside commode to allow for best chance of success per urology if unsuccessful patient will need 22 [place] two-way Foley placed.  Patient will need bladder scan if she does not void by 4 hours. Remains on heparin gtt for DVTs. Urology recommends that if patient continues to bleed she may need need cessation of heparin/anticoagulation and need IVC filter placed. PT/OT recommend SNF. - Transfusion threshold 8,hemoglobin at threshold>recieving 1U pRBC this morning - Continue gentle hydration - Appreciate urology  recommendations:if patient develops gross hematuria restart CBI immediatly -plan to transition to eliquis & d/c hep gtt  - Pain control with Tylenol 650 mg - Daily CBC's -ContinuePT/OT/SLP  Acute Bilateral DVTs, stable - Continue Eliquis 5mg  BID today  - Cardiac monitor - Monitor pulse oximetry  Acute Lacunar Infarct Multifocal intracranial stenosis, stable Patient with acute infarct recently. No new neurological deficits.  -on ASA 81 mg, has restarted DOAC, 8/17 - Avoid low BP. Goal 761-607 systolic,  AM BP 371/06. - No indication for TEE as patient will be on long-term anticoagulation for her b/l DVTs - Continue PT/OT/Speech Therapy  - anticipate SNF placement at [facility] place pending stability of clear urine   Hypertension: chronic, stable AM BP: 118/66. BP range 92/80-168/74. Home medications: Losartan 25mg , metoprolol 25mg , amlodipine 10mg .Goal systolic 269-485. Losartan was given once 8/17 as patient was hypertensive beyond goal.  - Discontinue losartan -Continue amlodipine10 mg, holding home metoprolol -Vital signs per floor protocol(recommended avoiding early a.m. vitals as patient politely requesting rest)  Carotid atherosclerosishyperlipidemia: stable Patient s/p left TCAR (08/20/2019) for symptomatic left carotid stenosis. Patient was started on aspirin and Plavixpost-procedure. Vascular USthis admission showed <50% stenosis of leftand right carotids. - Continue atorvastatin 40 mg daily -Currently on ASA 81 mg  Type 2 DM: chronic, stable HbA1c: 6.5%. Home medications: Metformin 500mg .  - holding home metformin - Continue to monitor CBGs however with reduced frequency (twice daily, once before breakfast, prior to bedtime) - Patient not requiring any insulin, continue to hold off on sliding scale  Uterine mass on CT Abd/Pelv: stable Patient with uterine mass at fundusmeasuring approximately 8 x 12 x 11 cmon CT scan 8/5. Not able to be  visualized on pelvic U/S. Will likely need TVUS once more stable/outpatient follow up. - TVUS likely outpatient one more stable -  Outpatient follow up  FEN/GI:NPO LNL:[id number]  Disposition: SNF pending stability of clear urine   Subjective:  Hgb this a.m. 7.1 patient is now voicing verbal "okay" to receive transfusion.  No overnight events.  Objective: Temp:  [98 F (36.7 C)-98.3 F (36.8 C)] 98.2 F (36.8 C) (08/18 0407) Pulse Rate:  [79-90] 82 (08/18 0407) Resp:  [16-20] 16 (08/18 0407) BP: (92-168)/(61-82) 118/66 (08/18 0407) SpO2:  [97 %-100 %] 100 % (08/18 0407) Physical Exam: General: Resting in bed, NAD, irritated mood with pressured tone but redirectable Cardiovascular: RRR, possible murmur detected Respiratory: Clear and equal bilaterally, good work of breathing Abdomen: Normoactive bowel sounds, no tenderness to palpation Extremities: No edema noted, distal pulses intact GU: 400 cc noted of amber urine  Laboratory: Recent Labs  Lab 09/28/19 0254 09/28/19 1632 09/28/19 1836 09/29/19 0145 09/30/19 0149  WBC 7.1  --   --  8.7 7.8  HGB 7.6*   < > 10.1* 8.0* 7.1*  HCT 23.5*   < > 31.0* 25.0* 22.5*  PLT 323  --   --  289 260   < > = values in this interval not displayed.   Recent Labs  Lab 09/28/19 1632  NA 139  K 3.7  CL 98  BUN 8  CREATININE 0.50  GLUCOSE 141*      Imaging/Diagnostic Tests: No results found. none new.  Freida Busman, MD 09/30/2019, 7:08 AM PGY-1, [place] Intern pager: [phone], text pages welcome

## 2019-09-30 NOTE — Plan of Care (Signed)
  Problem: Nutrition: Goal: Adequate nutrition will be maintained Outcome: Progressing   Problem: Pain Managment: Goal: General experience of comfort will improve Outcome: Progressing   Problem: Safety: Goal: Ability to remain free from injury will improve Outcome: Progressing   

## 2019-09-30 NOTE — Progress Notes (Signed)
Urology Inpatient Progress Report  Gross hematuria [R31.0] Acute lacunar stroke ([place]) [I63.81] Lacunar infarct, acute ([place]) [I63.81] Symptomatic anemia [D64.9]  Procedure(s): CYSTOSCOPY WITH FULGERATION AND CLOT EVACUATION  2 Days Post-Op   Intv/Subj: No acute events overnight.  Patient is without complaint.  Principal Problem:   Lacunar infarct, acute ([place]) Active Problems:   CVA (cerebral vascular accident) ([place])   Acute ischemic stroke ([place])   Hypertension   DM (diabetes mellitus), type 2, uncontrolled w/neurologic complication ([place])   Stenosis of carotid artery   Hematuria   Primary papillary carcinoma of bladder (HCC)   Acute blood loss anemia   Aphasia   Palliative care by specialist   DNR (do not resuscitate) discussion   Symptomatic anemia   Weakness generalized   Acute deep vein thrombosis (DVT) of proximal vein of both lower extremities (HCC)  Current Facility-Administered Medications  Medication Dose Route Frequency Provider Last Rate Last Admin  .  stroke: mapping our early stages of recovery book   Does not apply Once Milus Banister C, DO      . 0.9 %  sodium chloride infusion (Manually program via Guardrails IV Fluids)   Intravenous Once Mullis, Kiersten P, DO      . acetaminophen (TYLENOL) tablet 650 mg  650 mg Oral Q6H PRN Milus Banister C, DO   650 mg at 09/18/19 1819   Or  . acetaminophen (TYLENOL) suppository 650 mg  650 mg Rectal Q6H PRN Milus Banister C, DO      . amLODipine (NORVASC) tablet 10 mg  10 mg Oral Daily Simmons-Robinson, Makiera, MD   10 mg at 09/29/19 1124  . apixaban (ELIQUIS) tablet 5 mg  5 mg Oral BID Simmons-Robinson, Makiera, MD   5 mg at 09/29/19 2114  . aspirin chewable tablet 81 mg  81 mg Oral Daily Martyn Malay, MD   81 mg at 09/29/19 1124  . atorvastatin (LIPITOR) tablet 40 mg  40 mg Oral Daily Martyn Malay, MD   40 mg at 09/29/19 1124  . bisacodyl (DULCOLAX) suppository 10 mg  10 mg Rectal Daily PRN Daisy Floro, DO      . Chlorhexidine Gluconate Cloth 2 % PADS 6 each  6 each Topical Daily Martyn Malay, MD   6 each at 09/27/19 1045  . feeding supplement (BOOST / RESOURCE BREEZE) liquid 1 Container  1 Container Oral BID BM Zenia Resides, MD   1 Container at 09/29/19 1308  . losartan (COZAAR) tablet 12.5 mg  12.5 mg Oral Daily Llana Aliment, RPH      . multivitamin with minerals tablet 1 tablet  1 tablet Oral Daily Martyn Malay, MD   1 tablet at 09/29/19 1124  . pneumococcal 23 valent vaccine (PNEUMOVAX-23) injection 0.5 mL  0.5 mL Intramuscular Tomorrow-1000 Hensel, William A, MD      . polyethylene glycol (MIRALAX / GLYCOLAX) packet 17 g  17 g Oral Daily PRN Milus Banister C, DO         Objective: Vital: Vitals:   09/29/19 1654 09/29/19 2004 09/29/19 2339 09/30/19 0407  BP: (!) 148/82 92/80 (!) 143/61 118/66  Pulse: 82 90 79 82  Resp: 18 17 17 16   Temp: 98.1 F (36.7 C) 98.3 F (36.8 C) 98.1 F (36.7 C) 98.2 F (36.8 C)  TempSrc: Oral Oral Oral Oral  SpO2: 100% 97% 97% 100%  Weight:      Height:       I/Os: I/O last 3  completed shifts: In: 6114.2 [I.V.:114.2; Other:6000] Out: 7000 [Urine:7000]  Physical Exam:  General: Patient is in no apparent distress Lungs: Normal respiratory effort, chest expands symmetrically. GI:  abdomen is soft and nontender  Foley: 24Fr 3 way foley with CBI turned off, yellow urine in tubing, no hematuria or clots Ext: lower extremities symmetric  Lab Results: Recent Labs    09/28/19 0254 09/28/19 1632 09/28/19 1836 09/29/19 0145 09/30/19 0149  WBC 7.1  --   --  8.7 7.8  HGB 7.6*   < > 10.1* 8.0* 7.1*  HCT 23.5*   < > 31.0* 25.0* 22.5*   < > = values in this interval not displayed.   Recent Labs    09/28/19 1632  NA 139  K 3.7  CL 98  GLUCOSE 141*  BUN 8  CREATININE 0.50   No results for input(s): LABPT, INR in the last 72 hours. No results for input(s): LABURIN in the last 72 hours. Results for orders placed or  performed during the hospital encounter of 09/17/19  SARS Coronavirus 2 by RT PCR (hospital order, performed in [facility] hospital lab) Nasopharyngeal Nasopharyngeal Swab     Status: None   Collection Time: 09/17/19 10:35 PM   Specimen: Nasopharyngeal Swab  Result Value Ref Range Status   SARS Coronavirus 2 NEGATIVE NEGATIVE Final    Comment: (NOTE) SARS-CoV-2 target nucleic acids are NOT DETECTED.  The SARS-CoV-2 RNA is generally detectable in upper and lower respiratory specimens during the acute phase of infection. The lowest concentration of SARS-CoV-2 viral copies this assay can detect is 250 copies / mL. A negative result does not preclude SARS-CoV-2 infection and should not be used as the sole basis for treatment or other patient management decisions.  A negative result may occur with improper specimen collection / handling, submission of specimen other than nasopharyngeal swab, presence of viral mutation(s) within the areas targeted by this assay, and inadequate number of viral copies (<250 copies / mL). A negative result must be combined with clinical observations, patient history, and epidemiological information.  Fact Sheet for Patients:   StrictlyIdeas.no  Fact Sheet for Healthcare Providers: BankingDealers.co.za  This test is not yet approved or  cleared by the [place] FDA and has been authorized for detection and/or diagnosis of SARS-CoV-2 by FDA under an Emergency Use Authorization (EUA).  This EUA will remain in effect (meaning this test can be used) for the duration of the COVID-19 declaration under Section 564(b)(1) of the Act, 21 U.S.C. section 360bbb-3(b)(1), unless the authorization is terminated or revoked sooner.  Performed at [facility], [place] [address], [place], [place] [address]   Culture, Urine     Status: Abnormal   Collection Time: 09/18/19  8:04 PM   Specimen: Urine, Catheterized   Result Value Ref Range Status   Specimen Description URINE, CATHETERIZED  Final   Special Requests   Final    NONE Performed at [facility], [place] [address], [place], [place] [address]    Culture MULTIPLE SPECIES PRESENT, SUGGEST RECOLLECTION (A)  Final   Report Status 09/20/2019 FINAL  Final    Studies/Results: No results found.  Assessment: 69 year old woman with history of CVA, known high-grade T1 urothelial cell carcinoma, and acute bilateral DVTs on heparin who is undergone TURBT x2 with continued gross hematuria and clot urinary retention while on continuous bladder irrigation now POD2 s/p cystoscopy, clot evacuation, and fulguration.  CBI turned off and urine clear.  Heparin gtt stopped  and epixaban started.  Cysto, clot evac, TURBT 7/20 Cysto, clot evac, re-staging TURBT 8/10 Cysto, clot evac, fulguration 8/16  -recommend void trial this AM -please obtain bedside commode to allow patient to have best chance at success -if unsuccessful will need 22Fr 2 way foley placed (if hasn't voided by 4 hours please bladder scan)    Jacalyn Lefevre, MD Urology 09/30/2019, 8:16 AM

## 2019-10-01 LAB — TYPE AND SCREEN
ABO/RH(D): O POS
Antibody Screen: NEGATIVE
Unit division: 0
Unit division: 0

## 2019-10-01 LAB — BPAM RBC
Blood Product Expiration Date: 202109122359
Blood Product Expiration Date: 202109172359
ISSUE DATE / TIME: 202108161004
ISSUE DATE / TIME: 202108180913
Unit Type and Rh: 5100
Unit Type and Rh: 5100

## 2019-10-01 LAB — CBC
HCT: 27.6 % — ABNORMAL LOW (ref 36.0–46.0)
Hemoglobin: 8.8 g/dL — ABNORMAL LOW (ref 12.0–15.0)
MCH: 27.7 pg (ref 26.0–34.0)
MCHC: 31.9 g/dL (ref 30.0–36.0)
MCV: 86.8 fL (ref 80.0–100.0)
Platelets: 262 10*3/uL (ref 150–400)
RBC: 3.18 MIL/uL — ABNORMAL LOW (ref 3.87–5.11)
RDW: 16.3 % — ABNORMAL HIGH (ref 11.5–15.5)
WBC: 7 10*3/uL (ref 4.0–10.5)
nRBC: 0.3 % — ABNORMAL HIGH (ref 0.0–0.2)

## 2019-10-01 LAB — SARS CORONAVIRUS 2 (TAT 6-24 HRS): SARS Coronavirus 2: NEGATIVE

## 2019-10-01 NOTE — Progress Notes (Signed)
Family Medicine Teaching Service Daily Progress Note Intern Pager: [phone]  Patient name: Lisa [name] Medical record number: [id number] Date of birth: [birth year] Age: 69 y.o. Gender: female  Primary Care Provider: Patient, No Pcp Per Consultants: Neurology, vascular surgery, urology Code Status: FULL  Pt Overview and Major Events to Date:  09/17/2019 - admitted, received 1u PRBC 09/18/2019 - Transfused2u PRBC 09/19/2019 -Transfused1u PRBC 09/20/2019 - Transfused2U PRBC 09/20/2019 - Bilateral DVTs on vascular US; heparin gtt started 09/22/2019 -[facility]; 2 specimen collected 8/16: received 2 units pRBCs, OR forirrigation and removal clot 8/18: received 1 U pRBCs  Assessment and Plan: Lisa [name] is a 69 y.ofemale who presented with gross hematuria and blood loss anemia in setting of papillary carcinoma of the bladder. PMH significant for previous ischemic CVA with residual neurological defects, carotid stent placement due to carotid atherosclerosis, HTN, HLD, and type 2 diabetes.  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 Foley removed and patient was able to void adequately . Post void residual was appropriate and urine has remained yellow/straw colored.  - Transfusion threshold 8,hemoglobin at threshold>recieving 1U pRBC this morning - Appreciate urology recommendations:if patient develops gross hematuria restart CBI immediatly - Pain control with Tylenol 650 mg - Daily CBC's -ContinuePT/OT/SLP  Acute Bilateral DVTs, stable - Continue Eliquis 5mg  BID today - Cardiac monitor - Monitor pulse oximetry  Acute Lacunar Infarct Multifocal intracranial stenosis, stable Patient with acute infarct recently.No new neurological deficits.  -on ASA 81 mg, has restarted DOAC, 8/17 - Avoid low BP. Goal 378-588  systolic,  AM BP 502/77. - No indication for TEE as patient will be on long-term anticoagulation for her b/l DVTs - Continue PT/OT/Speech Therapy  [facility] stability of clear urine  Hypertension: chronic, stable AM BP:138/68. BP range 127/93-158/65. Home medications: Losartan 25mg , metoprolol 25mg , amlodipine 10mg .Goal systolic 412-878. Losartan was given once 8/17 as patient was hypertensive beyond goal.  -Continue amlodipine10 mg, holding home metoprolol and losartan -Vital signs per floor protocol(recommended avoiding early a.m. vitals as patient politely requesting rest)  Carotid atherosclerosishyperlipidemia: stable Patient s/p left TCAR (08/20/2019) for symptomatic left carotid stenosis. Patient was started on aspirin and Plavixpost-procedure. Vascular USthis admission showed <50% stenosis of leftand right carotids. - Continue atorvastatin 40 mg daily -Currently on ASA 81 mg  Type 2 DM: chronic, stable HbA1c: 6.5%.Home medications: Metformin 500mg .  - holding home metformin - Continue to monitor CBGs however with reduced frequency (twice daily, once before breakfast, prior to bedtime) - Patient not requiring any insulin, continue to hold off on sliding scale  Uterine mass on CT Abd/Pelv: stable Patient with uterine mass at fundusmeasuring approximately 8 x 12 x 11 cmon CT scan 8/5. Not able to be visualized on pelvic U/S. Will likely need TVUS once more stable/outpatient follow up. - TVUS likely outpatient one more stable - Outpatient follow up  FEN/GI: Heart MVE:HMCNOBS  Disposition: SNF    Subjective:  Patient had no overnight events.  No concerns at this time.  Objective: Temp:  [97.9 F (36.6 C)-98.5 F (36.9 C)] 98.4 F (36.9 C) (08/19 0318) Pulse Rate:  [75-81] 76 (08/19 0318) Resp:  [16-18] 18 (08/19 0318) BP: (127-158)/(65-93) 138/68 (08/19 0318) SpO2:  [97 %-100 %] 97 % (08/19 0318) Physical  Exam: General: Resting comfortable, pleasant mood, NAD Cardiovascular: RRR, no murmur detected Respiratory: Clear and equal bilaterally, good work of breathing Abdomen: Normal active bowel sounds, no tenderness  to palpation Extremities: Distal pulses intact, no edema noted  Laboratory: Recent Labs  Lab 09/29/19 0145 09/30/19 0149 10/01/19 0219  WBC 8.7 7.8 7.0  HGB 8.0* 7.1* 8.8*  HCT 25.0* 22.5* 27.6*  PLT 289 260 262   Recent Labs  Lab 09/28/19 1632  NA 139  K 3.7  CL 98  BUN 8  CREATININE 0.50  GLUCOSE 141*     Imaging/Diagnostic Tests: No results found. None new.  Lisa Busman, MD 10/01/2019, 7:37 AM PGY-1, [place] Intern pager: [phone], text pages welcome

## 2019-10-01 NOTE — Progress Notes (Signed)
Physical Therapy Treatment Patient Details Name: Lisa [name] MRN: [id number] DOB: 1950-02-29 Today's Date: 10/01/2019    History of Present Illness Pt is 69 year old with history of cerebrovascular disease with carotid disease s/p endovascular stent placement on 7/8, invasive papillary carcinoma of bladder, type 2 diabetes, memory impairment presenting with urinary tract bleeding and AMS. CT Head Significant for a lacunar infarct within the left lentiform nucleus; MRI brain revealed a 10 mm acute infarct in the posterior medial left thalamus.  Additionally, pt found to have bil LE DVT on 09/20/19 and on heparin.  Also, has received multiple units of PRBC, TransUrethral Resection of Bladder Tumor (TURBT)  on 09/22/19, and back to OR on 09/28/19 for Cystoscopy, clot evacuation and fulguration.    PT Comments    Patient expressed frustrations at start of session however with education on benefits of mobility she was agreeable to participate in therapy. Patient ambulated with 1HHA to steady and was unsteady required min assist 2x to prevent LOB with no UE support during gait. Pt declined use of RW and declined to amb in hallway for greater distance. Patient performed repeated sit<>stands for LE functional strengthening and reported she "really enjoyed that" after exercises. She will continue to benefit from skilled PT interventions to progress mobility and independence. Discharge recommendation remain appropriate. Acute PT will follow as able.   Follow Up Recommendations  SNF;Supervision/Assistance - 24 hour     Equipment Recommendations  None recommended by PT    Recommendations for Other Services       Precautions / Restrictions Precautions Precautions: Fall Restrictions Weight Bearing Restrictions: No    Mobility  Bed Mobility Overal bed mobility: Needs Assistance Bed Mobility: Supine to Sit;Sit to Supine     Supine to sit: Min guard;HOB elevated Sit to supine: Min guard    General bed mobility comments: min guard for safety, pt declined to sit up in recliner at EOS.  Transfers Overall transfer level: Needs assistance Equipment used: None;1 person hand held assist Transfers: Sit to/from Stand Sit to Stand: Min guard         General transfer comment: Min guard for safety. pt declining to use RW and states she does not do that (per prior PT sessions pt has been usign RW). Pt required min assist with 1HHA to steady for sit to stand from EOB. Additional sit<>stands performed for exercise.  Ambulation/Gait Ambulation/Gait assistance: Min assist Gait Distance (Feet): 30 Feet Assistive device: 1 person hand held assist;None Gait Pattern/deviations: Step-through pattern;Decreased stride length;Drifts right/left Gait velocity: decr   General Gait Details: pt refused to ambulate in hall and declined to use RW, 1HHA provided to steady pt. She performed short distance in room with no UE support and staggered Lt/Rt 2x requiring assist to steady.    Stairs          Wheelchair Mobility    Modified Rankin (Stroke Patients Only)       Balance Overall balance assessment: Needs assistance Sitting-balance support: Feet supported;No upper extremity supported Sitting balance-Leahy Scale: Good     Standing balance support: No upper extremity supported;During functional activity Standing balance-Leahy Scale: Fair Standing balance comment: unsteady with no support for gait           Cognition Arousal/Alertness: Awake/alert Behavior During Therapy: Agitated Overall Cognitive Status: No family/caregiver present to determine baseline cognitive functioning Area of Impairment: Attention;Memory;Following commands;Safety/judgement;Awareness;Problem solving                 Orientation  Level: Disoriented to;Situation;Time Current Attention Level: Sustained Memory: Decreased short-term memory;Decreased recall of precautions Following Commands: Follows  one step commands consistently;Follows multi-step commands inconsistently Safety/Judgement: Decreased awareness of safety;Decreased awareness of deficits Awareness: Emergent Problem Solving: Slow processing;Requires verbal cues;Difficulty sequencing General Comments: Pt agitated when PT arrived. Focused on "getting out of here'. Pt stating she did not want to do anything while she is still in hospital and is going to wait until she discharges to the next place. Pt expressed irritation at not knowing why she should do anything, repeated "why?" multiple times. Pt receptive to explanation of benefits of mobility for strengthening and to improve independence with mobility. Pt then agreeable to work with PT.       Exercises Other Exercises Other Exercises: 1x10 reps sit<>stand with UE use on EOB (last 2 reps without UE use). 2x5 reps with UE's across chest and cues for forward trunk lean to rise with LE use only for strengthening. Pt required rest after 5 reps.    General Comments        Pertinent Vitals/Pain Pain Assessment: No/denies pain Pain Intervention(s): Monitored during session           PT Goals (current goals can now be found in the care plan section) Acute Rehab PT Goals Patient Stated Goal: get back to independence PT Goal Formulation: With patient Time For Goal Achievement: 10/13/19 Potential to Achieve Goals: Good Progress towards PT goals: Progressing toward goals    Frequency    Min 3X/week      PT Plan Current plan remains appropriate       AM-PAC PT "6 Clicks" Mobility   Outcome Measure  Help needed turning from your back to your side while in a flat bed without using bedrails?: A Little Help needed moving from lying on your back to sitting on the side of a flat bed without using bedrails?: A Little Help needed moving to and from a bed to a chair (including a wheelchair)?: A Little Help needed standing up from a chair using your arms (e.g., wheelchair or  bedside chair)?: A Little Help needed to walk in hospital room?: A Little Help needed climbing 3-5 steps with a railing? : A Little 6 Click Score: 18    End of Session Equipment Utilized During Treatment: Gait belt Activity Tolerance: Patient tolerated treatment well Patient left: in bed;with call bell/phone within reach;with bed alarm set Nurse Communication: Mobility status PT Visit Diagnosis: Difficulty in walking, not elsewhere classified (R26.2);Other symptoms and signs involving the nervous system (R29.898)     Time: 2297-9892 PT Time Calculation (min) (ACUTE ONLY): 18 min  Charges:  $Therapeutic Exercise: 8-22 mins                     Verner Mould, DPT Acute Rehabilitation Services  Office [phone] Pager [phone]  10/01/2019 4:32 PM

## 2019-10-01 NOTE — Plan of Care (Signed)
  Problem: Education: Goal: Knowledge of General Education information will improve Description Including pain rating scale, medication(s)/side effects and non-pharmacologic comfort measures Outcome: Progressing   Problem: Nutrition: Goal: Adequate nutrition will be maintained Outcome: Progressing   Problem: Pain Managment: Goal: General experience of comfort will improve Outcome: Progressing   

## 2019-10-01 NOTE — TOC Progression Note (Signed)
Transition of Care [facility]) - Progression Note    Patient Details  Name: Lisa [name] MRN: [id number] Date of Birth: 03/20/1950  Transition of Care [facility] CM/SW [place], [place] Phone Number:  10/01/2019, 5:09 PM  Clinical Narrative:   CSW alerted by MD to resume SNF placement as patient is approaching medical stability. CSW sent insurance auth request to [facility] Medicare again, as previous [facility] has expired. CSW updated sister-in-law [name]. [name] indicated that patient's husband was coming to the bedside today and she would like palliative to update the husband on patient's prognosis and needs moving forward. CSW asked Stanton Kidney with PMT to call the husband, and she said she would. CSW to follow.    Expected Discharge Plan: Skilled Nursing Facility Barriers to Discharge: Continued Medical Work up; Orthoptist and Services Expected Discharge Plan: [place]       Living arrangements for the past 2 months: [place]                                       Social Determinants of Health (SDOH) Interventions    Readmission Risk Interventions No flowsheet data found.

## 2019-10-02 LAB — CBC
HCT: 30.9 % — ABNORMAL LOW (ref 36.0–46.0)
Hemoglobin: 9.7 g/dL — ABNORMAL LOW (ref 12.0–15.0)
MCH: 27.8 pg (ref 26.0–34.0)
MCHC: 31.4 g/dL (ref 30.0–36.0)
MCV: 88.5 fL (ref 80.0–100.0)
Platelets: 299 10*3/uL (ref 150–400)
RBC: 3.49 MIL/uL — ABNORMAL LOW (ref 3.87–5.11)
RDW: 16.8 % — ABNORMAL HIGH (ref 11.5–15.5)
WBC: 7.4 10*3/uL (ref 4.0–10.5)
nRBC: 0 % (ref 0.0–0.2)

## 2019-10-02 NOTE — Progress Notes (Signed)
Patient refusing to have vital signs taken.  As soon as RN or NT walk in the room patient states "what are you doing, I told you to not come to my room tonight"  RN advised patient several times that RN and NT will have to continue to round on her several times throughout the night to make sure she is okay.  Patient continue to get upset that she cant sleep.

## 2019-10-02 NOTE — Progress Notes (Signed)
Occupational Therapy Treatment Patient Details Name: Lisa [name] MRN: [id number] DOB: [birth year] Today's Date: 10/02/2019    History of present illness Pt is 69 year old with history of cerebrovascular disease with carotid disease s/p endovascular stent placement on 7/8, invasive papillary carcinoma of bladder, type 2 diabetes, memory impairment presenting with urinary tract bleeding and AMS. CT Head Significant for a lacunar infarct within the left lentiform nucleus; MRI brain revealed a 10 mm acute infarct in the posterior medial left thalamus.  Additionally, pt found to have bil LE DVT on 09/20/19 and on heparin.  Also, has received multiple units of PRBC, TransUrethral Resection of Bladder Tumor (TURBT)  on 09/22/19, and back to OR on 09/28/19 for Cystoscopy, clot evacuation and fulguration.   OT comments  Patient continues to make steady progress towards goals in skilled OT session. Patient's session encompassed ADLs and functional mobility, however pt remains self-limiting in progress. Pt was adamant upon entry she would not get up, however after further explanation was willing to participate. Despite being incontinent of urine pt refused to change mesh underwear or complete ADLs at the sink. Pt is beginning to show increased ability in word finding despite aphasia, however remains unable to state her own name. Discharge remains appropriate, will continue to follow acutely.    Follow Up Recommendations  SNF;Home health OT;Supervision/Assistance - 24 hour (pending progress)    Equipment Recommendations       Recommendations for Other Services      Precautions / Restrictions Precautions Precautions: Fall Restrictions Weight Bearing Restrictions: No       Mobility Bed Mobility Overal bed mobility: Needs Assistance Bed Mobility: Supine to Sit     Supine to sit: Modified independent (Device/Increase time)        Transfers Overall transfer level: Needs assistance Equipment  used: None Transfers: Sit to/from Stand Sit to Stand: Min guard         General transfer comment: Min gaurd for safety, able to maneuver around bed and obstacles without bracing or need of AD    Balance Overall balance assessment: Needs assistance Sitting-balance support: Feet supported;No upper extremity supported Sitting balance-Leahy Scale: Good     Standing balance support: No upper extremity supported;During functional activity Standing balance-Leahy Scale: Fair                             ADL either performed or assessed with clinical judgement   ADL Overall ADL's : Needs assistance/impaired                     Lower Body Dressing: Cueing for safety;Cueing for sequencing;Min guard Lower Body Dressing Details (indicate cue type and reason): Put socks on hands first, requiring cues that they went on her feet Toilet Transfer: Ambulation;Min guard Toilet Transfer Details (indicate cue type and reason): Simulated with recliner         Functional mobility during ADLs: Minimal assistance;Cueing for safety;Cueing for sequencing General ADL Comments: Pt with increased participation in ADLs, but continues to take a great deal of coaxing     Vision       Perception     Praxis      Cognition Arousal/Alertness: Awake/alert Behavior During Therapy: Restless Overall Cognitive Status: No family/caregiver present to determine baseline cognitive functioning Area of Impairment: Attention;Memory;Following commands;Safety/judgement;Awareness;Problem solving                 Orientation Level: Disoriented to;Situation;Time  Current Attention Level: Sustained Memory: Decreased short-term memory;Decreased recall of precautions Following Commands: Follows one step commands consistently;Follows multi-step commands inconsistently Safety/Judgement: Decreased awareness of safety;Decreased awareness of deficits Awareness: Emergent Problem Solving: Slow  processing;Requires verbal cues;Difficulty sequencing General Comments: pt frustrated upon therapist arrival, stating "I dont know why I need to keep working on these things". Once more explained the purpose of therapy for her discharge and willing to participate minimally, despite being incontinent of urine, could not be convinced to change underwear, or wash up. Did request to wash up later in the afternoon with assist, however could not be coaxed to complete with therapist        Exercises     Shoulder Instructions       General Comments      Pertinent Vitals/ Pain       Pain Assessment: No/denies pain  Home Living                                          Prior Functioning/Environment              Frequency  Min 2X/week        Progress Toward Goals  OT Goals(current goals can now be found in the care plan section)  Progress towards OT goals: Progressing toward goals  Acute Rehab OT Goals Patient Stated Goal: get back to independence OT Goal Formulation: With patient Time For Goal Achievement: 10/04/19 Potential to Achieve Goals: Good  Plan Discharge plan remains appropriate    Co-evaluation                 AM-PAC OT "6 Clicks" Daily Activity     Outcome Measure   Help from another person eating meals?: A Little Help from another person taking care of personal grooming?: A Little Help from another person toileting, which includes using toliet, bedpan, or urinal?: A Lot Help from another person bathing (including washing, rinsing, drying)?: A Lot Help from another person to put on and taking off regular upper body clothing?: A Little Help from another person to put on and taking off regular lower body clothing?: A Little 6 Click Score: 16    End of Session Equipment Utilized During Treatment: Gait belt  OT Visit Diagnosis: Unsteadiness on feet (R26.81);Muscle weakness (generalized) (M62.81);Other symptoms and signs involving the  nervous system (R29.898);Other symptoms and signs involving cognitive function   Activity Tolerance Patient tolerated treatment well   Patient Left in chair;with call bell/phone within reach;with chair alarm set   Nurse Communication Mobility status;Other (comment) (Wanting to wash up later)        Time: 7564-3329 OT Time Calculation (min): 12 min  Charges: OT General Charges $OT Visit: 1 Visit OT Treatments $Self Care/Home Management : 8-22 mins  Lisa [name], [place] Acute Rehabilitation Services [place] 10/02/2019, 9:53 AM

## 2019-10-02 NOTE — Progress Notes (Signed)
Physical Therapy Treatment Patient Details Name: Lisa [name] MRN: [id number] DOB: Mar 23, 1950 Today's Date: 10/02/2019    History of Present Illness Pt is 69 year old with history of cerebrovascular disease with carotid disease s/p endovascular stent placement on 7/8, invasive papillary carcinoma of bladder, type 2 diabetes, memory impairment presenting with urinary tract bleeding and AMS. CT Head Significant for a lacunar infarct within the left lentiform nucleus; MRI brain revealed a 10 mm acute infarct in the posterior medial left thalamus.  Additionally, pt found to have bil LE DVT on 09/20/19 and on heparin.  Also, has received multiple units of PRBC, TransUrethral Resection of Bladder Tumor (TURBT)  on 09/22/19, and back to OR on 09/28/19 for Cystoscopy, clot evacuation and fulguration.    PT Comments    Pt is very happy to have been able to wash her hair today. Pt reports she is tired but willing to work with therapy at the edge of the bed. Pt is min guard for bed mobility and min guard for 6x sit<>stand. When pt's husband enters room pt refuses further therapy and gets back in bed. D/c plan remains appropriate. PT will continue to follow acutely.     Follow Up Recommendations  SNF;Supervision/Assistance - 24 hour     Equipment Recommendations  None recommended by PT       Precautions / Restrictions Precautions Precautions: Fall Restrictions Weight Bearing Restrictions: No    Mobility  Bed Mobility Overal bed mobility: Needs Assistance Bed Mobility: Supine to Sit;Sit to Supine     Supine to sit: Min guard;HOB elevated Sit to supine: Min guard   General bed mobility comments: min guard for safety  Transfers Overall transfer level: Needs assistance Equipment used: None;1 person hand held assist Transfers: Sit to/from Stand Sit to Stand: Min guard         General transfer comment: Min guard for safety. pt declining to use RW however agreeable to performing 6x  sit<>stands, until husband enters and pt refuses further theraoy      Modified Rankin (Stroke Patients Only) Modified Rankin (Stroke Patients Only) Pre-Morbid Rankin Score: Moderately severe disability Modified Rankin: Moderately severe disability     Balance Overall balance assessment: Needs assistance Sitting-balance support: Feet supported;No upper extremity supported Sitting balance-Leahy Scale: Good     Standing balance support: No upper extremity supported;During functional activity Standing balance-Leahy Scale: Fair Standing balance comment: unsteady with dynamic balance                            Cognition Arousal/Alertness: Awake/alert Behavior During Therapy: Restless Overall Cognitive Status: No family/caregiver present to determine baseline cognitive functioning Area of Impairment: Attention;Memory;Following commands;Safety/judgement;Awareness;Problem solving                 Orientation Level: Disoriented to;Situation;Time Current Attention Level: Sustained Memory: Decreased short-term memory;Decreased recall of precautions Following Commands: Follows one step commands consistently;Follows multi-step commands inconsistently Safety/Judgement: Decreased awareness of safety;Decreased awareness of deficits Awareness: Emergent Problem Solving: Slow processing;Requires verbal cues;Difficulty sequencing General Comments: continues to be frustrated with therapy, does not understand why she needs to work with therapy, with explanation pt reluctantly agreeable until husband comes and then refuses further therapy.       Exercises Other Exercises Other Exercises: 1x6 reps sit<>stand with UE use on EOB    General Comments  VSS on RA      Pertinent Vitals/Pain Pain Assessment: No/denies pain  PT Goals (current goals can now be found in the care plan section) Acute Rehab PT Goals Patient Stated Goal: get back to independence PT Goal  Formulation: With patient Time For Goal Achievement: 10/13/19 Potential to Achieve Goals: Good Progress towards PT goals: Not progressing toward goals - comment (limited session due to arrival of husband)    Frequency    Min 3X/week      PT Plan Current plan remains appropriate       AM-PAC PT "6 Clicks" Mobility   Outcome Measure  Help needed turning from your back to your side while in a flat bed without using bedrails?: A Little Help needed moving from lying on your back to sitting on the side of a flat bed without using bedrails?: A Little Help needed moving to and from a bed to a chair (including a wheelchair)?: A Little Help needed standing up from a chair using your arms (e.g., wheelchair or bedside chair)?: A Little Help needed to walk in hospital room?: A Little Help needed climbing 3-5 steps with a railing? : A Little 6 Click Score: 18    End of Session Equipment Utilized During Treatment: Gait belt Activity Tolerance: Patient tolerated treatment well Patient left: in bed;with call bell/phone within reach;with bed alarm set;with family/visitor present Nurse Communication: Mobility status PT Visit Diagnosis: Difficulty in walking, not elsewhere classified (R26.2);Other symptoms and signs involving the nervous system (L49.179)     Time: 1505-6979 PT Time Calculation (min) (ACUTE ONLY): 9 min  Charges:  $Therapeutic Exercise: 8-22 mins                     Ellisyn Icenhower B. Migdalia Dk PT, DPT Acute Rehabilitation Services Pager [phone] Office [phone]    [place] 10/02/2019, 5:00 PM

## 2019-10-02 NOTE — Progress Notes (Signed)
Family Medicine Teaching Service Daily Progress Note Intern Pager: [phone]  Patient name: Lisa [name] Medical record number: [id number] Date of birth: 1950/05/01 Age: 69 y.o. Gender: female  Primary Care Provider: Patient, No Pcp Per Consultants: Neurology, vascular surgery, urology Code Status: FULL  Pt Overview and Major Events to Date:  09/17/2019 - admitted, received 1u PRBC 09/18/2019 - Transfused2u PRBC 09/19/2019 -Transfused1u PRBC 09/20/2019 - Transfused2U PRBC 09/20/2019 - Bilateral DVTs on vascular US; heparin gtt started 09/22/2019 -[facility] [place]; 2 specimen collected 8/16: received 2 units pRBCs, OR forirrigation and removal clot 8/18: received 1 U pRBCs  Assessment and Plan: Lisa [name] is a 69 y.ofemale who presented with gross hematuria and blood loss anemia in setting of papillary carcinoma of the bladder. PMH significant for previous ischemic CVA with residual neurological defects, carotid stent placement due to carotid atherosclerosis, HTN, HLD, and type 2 diabetes.  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  Patient is medically stable for discharge.  Per social work patient will be discharged to [facility] tomorrow morning. - Transfusion threshold 8,hemoglobin at threshold>recieving 1U pRBC this morning - Pain control with Tylenol 650 mg - Daily CBC's -ContinuePT/OT/SLP  Acute Bilateral DVTs, stable -ContinueEliquis 5mg  BID today - Cardiac monitor - Monitor pulse oximetry  Acute Lacunar Infarct Multifocal intracranial stenosis, stable Patient with acute infarct recently.No new neurological deficits. -on ASA 81 mg,has restartedDOAC, 8/17 - Avoid low BP. Goal 277-412 systolic, AM BP 878/67. - No indication for TEE as patient will be  on long-term anticoagulation for her b/l DVTs - Continue PT/OT/Speech Therapy  [facility] pending stability of clear urine  Hypertension: chronic, stable AM BP:128/72.BP range 128/72-160/72.Home medications: Losartan 25mg , metoprolol 25mg , amlodipine 10mg .Goal systolic 672-094. -Continue amlodipine10 mg, holding home metoprolol and losartan -Vital signs per floor protocol(recommended avoiding early a.m. vitals as patient politely requesting rest)  Carotid atherosclerosishyperlipidemia: stable Patient s/p left TCAR (08/20/2019) for symptomatic left carotid stenosis. Patient was started on aspirin and Plavixpost-procedure. Vascular USthis admission showed <50% stenosis of leftand right carotids. - Continue atorvastatin 40 mg daily -Currently on ASA 81 mg  Type 2 DM: chronic, stable HbA1c: 6.5%.Home medications: Metformin 500mg .  - holding home metformin - Continue to monitor CBGs however with reduced frequency (twice daily, once before breakfast, prior to bedtime) - Patient not requiring any insulin, continue to hold off on sliding scale  Uterine mass on CT Abd/Pelv: stable Patient with uterine mass at fundusmeasuring approximately 8 x 12 x 11 cmon CT scan 8/5. Not able to be visualized on pelvic U/S. Will likely need TVUS once more stable/outpatient follow up. - TVUS likely outpatient one more stable - Outpatient follow up  FEN/GI: Heart BSJ:[id number]  Disposition: SNF   Subjective:  Patient had no overnight events.  No complaints this a.m.  Objective: Temp:  [97.8 F (36.6 C)-98.6 F (37 C)] 98.6 F (37 C) (08/19 2006) Pulse Rate:  [87-92] 88 (08/19 2006) Resp:  [18-24] 18 (08/19 2006) BP: (128-160)/(68-88) 128/72 (08/19 2006) SpO2:  [97 %-100 %] 100 % (08/19 2006) Physical Exam: General: Resting in bed, NAD Cardiovascular: Regular rate and rhythm, no murmur Respiratory: Clear and equal bilaterally, good work of  breathing Abdomen: No pain to palpation, normoactive bowel sounds Extremities: No edema, distal pulses intact  Laboratory: Recent Labs  Lab 09/29/19 0145 09/30/19 0149 10/01/19 0219  WBC 8.7 7.8 7.0  HGB  8.0* 7.1* 8.8*  HCT 25.0* 22.5* 27.6*  PLT 289 260 262   Recent Labs  Lab 09/28/19 1632  NA 139  K 3.7  CL 98  BUN 8  CREATININE 0.50  GLUCOSE 141*      Imaging/Diagnostic Tests: None new  Freida Busman, MD 10/02/2019, 6:50 AM PGY-1, [place] Intern pager: [phone], text pages welcome

## 2019-10-02 NOTE — TOC Progression Note (Signed)
Transition of Care [facility]) - Progression Note    Patient Details  Name: ALLYSHA TRYON MRN: [id number] Date of Birth: [birth year]  Transition of Care [facility]) CM/SW Contact  [name], RN Phone Number: 10/02/2019, 10:46 AM  Clinical Narrative:    Pt has insurance approval and in date covid test for admit to SNF. [facility] have a bed for admission until tomorrow. MD updated.    Expected Discharge Plan: [place] Barriers to Discharge: Continued Medical Work up  Expected Discharge Plan and Services Expected Discharge Plan: [place] arrangements for the past 2 months: [place]                                       Social Determinants of Health (SDOH) Interventions    Readmission Risk Interventions No flowsheet data found.

## 2019-10-03 DIAGNOSIS — R2689 Other abnormalities of gait and mobility: Secondary | ICD-10-CM | POA: Diagnosis not present

## 2019-10-03 DIAGNOSIS — C801 Malignant (primary) neoplasm, unspecified: Secondary | ICD-10-CM | POA: Diagnosis present

## 2019-10-03 DIAGNOSIS — E7800 Pure hypercholesterolemia, unspecified: Secondary | ICD-10-CM | POA: Diagnosis not present

## 2019-10-03 DIAGNOSIS — Z743 Need for continuous supervision: Secondary | ICD-10-CM | POA: Diagnosis not present

## 2019-10-03 DIAGNOSIS — Z79899 Other long term (current) drug therapy: Secondary | ICD-10-CM | POA: Diagnosis not present

## 2019-10-03 DIAGNOSIS — R6889 Other general symptoms and signs: Secondary | ICD-10-CM | POA: Diagnosis not present

## 2019-10-03 DIAGNOSIS — M2550 Pain in unspecified joint: Secondary | ICD-10-CM | POA: Diagnosis not present

## 2019-10-03 DIAGNOSIS — I6381 Other cerebral infarction due to occlusion or stenosis of small artery: Secondary | ICD-10-CM | POA: Diagnosis not present

## 2019-10-03 DIAGNOSIS — Z20822 Contact with and (suspected) exposure to covid-19: Secondary | ICD-10-CM | POA: Diagnosis present

## 2019-10-03 DIAGNOSIS — Z801 Family history of malignant neoplasm of trachea, bronchus and lung: Secondary | ICD-10-CM | POA: Diagnosis not present

## 2019-10-03 DIAGNOSIS — N029 Recurrent and persistent hematuria with unspecified morphologic changes: Secondary | ICD-10-CM | POA: Diagnosis not present

## 2019-10-03 DIAGNOSIS — D62 Acute posthemorrhagic anemia: Secondary | ICD-10-CM | POA: Diagnosis not present

## 2019-10-03 DIAGNOSIS — E119 Type 2 diabetes mellitus without complications: Secondary | ICD-10-CM | POA: Diagnosis not present

## 2019-10-03 DIAGNOSIS — Z87891 Personal history of nicotine dependence: Secondary | ICD-10-CM | POA: Diagnosis not present

## 2019-10-03 DIAGNOSIS — E43 Unspecified severe protein-calorie malnutrition: Secondary | ICD-10-CM | POA: Diagnosis not present

## 2019-10-03 DIAGNOSIS — Z7902 Long term (current) use of antithrombotics/antiplatelets: Secondary | ICD-10-CM | POA: Diagnosis not present

## 2019-10-03 DIAGNOSIS — N179 Acute kidney failure, unspecified: Secondary | ICD-10-CM | POA: Diagnosis not present

## 2019-10-03 DIAGNOSIS — Z8249 Family history of ischemic heart disease and other diseases of the circulatory system: Secondary | ICD-10-CM | POA: Diagnosis not present

## 2019-10-03 DIAGNOSIS — N390 Urinary tract infection, site not specified: Secondary | ICD-10-CM | POA: Diagnosis not present

## 2019-10-03 DIAGNOSIS — I824Z3 Acute embolism and thrombosis of unspecified deep veins of distal lower extremity, bilateral: Secondary | ICD-10-CM | POA: Diagnosis not present

## 2019-10-03 DIAGNOSIS — I82403 Acute embolism and thrombosis of unspecified deep veins of lower extremity, bilateral: Secondary | ICD-10-CM | POA: Diagnosis not present

## 2019-10-03 DIAGNOSIS — M6281 Muscle weakness (generalized): Secondary | ICD-10-CM | POA: Diagnosis not present

## 2019-10-03 DIAGNOSIS — Z86718 Personal history of other venous thrombosis and embolism: Secondary | ICD-10-CM | POA: Diagnosis not present

## 2019-10-03 DIAGNOSIS — N200 Calculus of kidney: Secondary | ICD-10-CM | POA: Diagnosis not present

## 2019-10-03 DIAGNOSIS — E872 Acidosis: Secondary | ICD-10-CM | POA: Diagnosis not present

## 2019-10-03 DIAGNOSIS — I10 Essential (primary) hypertension: Secondary | ICD-10-CM | POA: Diagnosis not present

## 2019-10-03 DIAGNOSIS — Z7401 Bed confinement status: Secondary | ICD-10-CM | POA: Diagnosis not present

## 2019-10-03 DIAGNOSIS — Z7984 Long term (current) use of oral hypoglycemic drugs: Secondary | ICD-10-CM | POA: Diagnosis not present

## 2019-10-03 DIAGNOSIS — Z7982 Long term (current) use of aspirin: Secondary | ICD-10-CM | POA: Diagnosis not present

## 2019-10-03 DIAGNOSIS — R4701 Aphasia: Secondary | ICD-10-CM | POA: Diagnosis not present

## 2019-10-03 DIAGNOSIS — R58 Hemorrhage, not elsewhere classified: Secondary | ICD-10-CM | POA: Diagnosis not present

## 2019-10-03 DIAGNOSIS — I824Y3 Acute embolism and thrombosis of unspecified deep veins of proximal lower extremity, bilateral: Secondary | ICD-10-CM | POA: Diagnosis not present

## 2019-10-03 DIAGNOSIS — E785 Hyperlipidemia, unspecified: Secondary | ICD-10-CM | POA: Diagnosis not present

## 2019-10-03 DIAGNOSIS — C679 Malignant neoplasm of bladder, unspecified: Secondary | ICD-10-CM | POA: Diagnosis not present

## 2019-10-03 DIAGNOSIS — R2681 Unsteadiness on feet: Secondary | ICD-10-CM | POA: Diagnosis not present

## 2019-10-03 DIAGNOSIS — A419 Sepsis, unspecified organism: Secondary | ICD-10-CM | POA: Diagnosis not present

## 2019-10-03 DIAGNOSIS — E1149 Type 2 diabetes mellitus with other diabetic neurological complication: Secondary | ICD-10-CM | POA: Diagnosis not present

## 2019-10-03 DIAGNOSIS — E876 Hypokalemia: Secondary | ICD-10-CM | POA: Diagnosis not present

## 2019-10-03 DIAGNOSIS — Z7901 Long term (current) use of anticoagulants: Secondary | ICD-10-CM | POA: Diagnosis not present

## 2019-10-03 DIAGNOSIS — R404 Transient alteration of awareness: Secondary | ICD-10-CM | POA: Diagnosis not present

## 2019-10-03 DIAGNOSIS — R310 Gross hematuria: Secondary | ICD-10-CM | POA: Diagnosis not present

## 2019-10-03 LAB — CBC
HCT: 32.4 % — ABNORMAL LOW (ref 36.0–46.0)
Hemoglobin: 10.1 g/dL — ABNORMAL LOW (ref 12.0–15.0)
MCH: 27.7 pg (ref 26.0–34.0)
MCHC: 31.2 g/dL (ref 30.0–36.0)
MCV: 88.8 fL (ref 80.0–100.0)
Platelets: 333 10*3/uL (ref 150–400)
RBC: 3.65 MIL/uL — ABNORMAL LOW (ref 3.87–5.11)
RDW: 16.5 % — ABNORMAL HIGH (ref 11.5–15.5)
WBC: 6.9 10*3/uL (ref 4.0–10.5)
nRBC: 0 % (ref 0.0–0.2)

## 2019-10-03 MED ORDER — APIXABAN 5 MG PO TABS: 5.0000 mg | ORAL_TABLET | Freq: Two times a day (BID) | ORAL | 0 refills | Status: DC

## 2019-10-03 NOTE — TOC Transition Note (Signed)
Transition of Care [facility]) - CM/SW Discharge Note   Patient Details  Name: Lisa [name] MRN: [id number] Date of Birth: 07/26/1950  Transition of Care [facility]) CM/SW Contact:  Geralynn Ochs, LCSW Phone Number: 10/03/2019, 9:32 AM   Clinical Narrative:   Nurse to call report to [phone], ask for [facility]. Room 704P.    Final next level of care: Skilled Nursing Facility Barriers to Discharge: Barriers Resolved   Patient Goals and CMS Choice   CMS Medicare.gov Compare Post Acute Care list provided to:: Patient Choice offered to / list presented to : Patient (sister in law)  Discharge Placement              Patient chooses bed at: [facility] Patient to be transferred to facility by: [place] Name of family member notified: Self, [place] Patient and family notified of of transfer: 10/03/19  Discharge Plan and Services                                     Social Determinants of Health (SDOH) Interventions     Readmission Risk Interventions No flowsheet data found.

## 2019-10-03 NOTE — Progress Notes (Addendum)
PTAR arrived for transport to [facility]. Avs placed in envelope for receiving facility. IV removed. Belongings taken by patient spouse.  Report called to nurse daniel. All questions answered.  Gwendolyn Grant, RN

## 2019-10-03 NOTE — Discharge Summary (Addendum)
Lisa [name]  Patient name: Lisa [name] Medical record number: [id number] Date of birth: [birth year] Age: 69 y.o. Gender: female Date of Admission: 09/17/2019  Date of Discharge: 10/03/2019  Admitting Physician: Martyn Malay, MD  Primary Care Provider: Patient, No Pcp Per Consultants: Neurology, vascular surgery, urology  Indication for Hospitalization: Anemia  Discharge Diagnoses/Problem List:  Primary Diagnosis:  High-grade papillary carcinoma of the bladder with resulting acute blood loss anemia from bladder bleeding  Active issues during admission:  Acute lacunar infarct Bilateral Lower Extremity DVTs Presumed PE AKI-resolved  Disposition: SNF  Discharge Condition: Stable  Discharge Exam:  General: Resting in bed, NAD Cardiovascular: Regular rate and rhythm, no murmur Respiratory: Clear and equal bilaterally, good work of breathing Abdomen: No pain to palpation, normoactive bowel sounds Extremities: No edema, distal pulses intact   Brief Hospital Course:  Lisa [name] is a 69 y.o female who presented with gross hematuria and blood loss anemia in setting of papillary carcinoma of the bladder.  PMH significant for previous ischemic CVA with residual neurological defects, carotid stent placement due to carotid atherosclerosis, HTN, HLD, and type 2 diabetes. Below is her hospital course by problem list. Refer to H&P for additional information.   Acute Blood Loss Anemia 2/2 hematuria in the setting of high-grade papillary carcinoma of the bladder Hemoglobin on admission 6.9. Threshold for transfusion of 8. Received total of 9U PRBC while admitted. Patient with bladder retention of over 800cc on admission, requiring in and out cath. Bladder scans scheduled and patient with consistent retention foley catheter was placed. Urology on board. Placed 16 French catheter initially but due to pain and bladder spasms, switched to 20  [place]. Belladonna&opium suppository given PRN for bladder spasms. US Pelvis noted blood clot in bladder. Urology irrigated bladder and clot evacuated on 8/8 with 24 French catheter. Restaging TURBT at [facility] with intravesical gemcitabine. 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 Heparin gtt for b/l DVTs until patient became Hgb remained stable. Failed voiding trial, after TURBT.  Foley re-inserted for to allow for healing of bladder tumor resection.  Changed to 24 [place] three-way hematuria catheter for persistent hematuria with clotting.  Continuous bladder irrigation started.  Patient began bleeding into the Foley again and required transfusion.  Patient required cystoscopy with clot evacuation and fulguration to her with responded well and was continued on bladder irrigation post procedure.  Patient switched to home DOAC, apixaban.  Hgb remained stable. Foley removed and passed voiding trial and multiple nonbloody urines.  Declared stable for discharge to SNF.   Acute Left Lacunar Stroke  Hx Left MCA CVA Head CT in ED significant for acute to subacute left lacunar stroke. MRI/MRA with multifocal stenosis. Home ASA and Plavix initially held due to active bleeding and anemia. Vascular surgery consulted and recommended continuing ASA 81 mg.  Repeat echo this admission did not show density. TEE was postponed per neurology and cardiology recommendations and patient with long-term anticoagulation for DVT's. Carotid US to assess stent, showed less than 50% stenosis of stent.   Bilateral Lower Extremity DVT  Presumed PE Patient had episode tachycardia to 120's and was asymptomatic with no chest pain and normal respiratory exam, satting WNL on room air. In the setting of papillary cancer, there was concern for PE. Unfortunately, patient declined CTA imaging. Lower extremity dopplers obtained which were significant for left femoral vein  and right popliteal vein DVT's. Discussed with  Urology and patient started on heparin IV followed by start of apixaban DOAC  With malignancy, anemia requiring multiple transfusions and now b/l DVT's, palliative consult was placed. It was decided that patient would want continued medical therapy to prolong life. She also did complete HCPOA paperwork while on admission.   AKI likey 2/2 hypovolemia  Resolved with fluid bolus. Remained stable.  Other problems chronic and stable   Issues for Follow Up:  1. Recommend continuing Lipitor 80mg  by mouth daily. 2. Follow up Stroke clinic with Dr. Leonie Man at [facility] in 4 weeks  3. Outpatient follow up with Urology for bladder carcinoma   Significant Procedures: TURBT w/ intravesical gemcitabine, 2 specimens collected 8/10  Significant Labs and Imaging:  Recent Labs  Lab 09/30/19 0149 10/01/19 0219 10/02/19 1312  WBC 7.8 7.0 7.4  HGB 7.1* 8.8* 9.7*  HCT 22.5* 27.6* 30.9*  PLT 260 262 299   Recent Labs  Lab 09/28/19 1632  NA 139  K 3.7  CL 98  GLUCOSE 141*  BUN 8  CREATININE 0.50    Results/Tests Pending at Time of Discharge: none  Discharge Medications:  Allergies as of 10/03/2019   No Known Allergies     Medication List    STOP taking these medications   clopidogrel 75 MG tablet Commonly known as: PLAVIX   losartan 25 MG tablet Commonly known as: Cozaar   metoprolol tartrate 25 MG tablet Commonly known as: LOPRESSOR     TAKE these medications   amLODipine 10 MG tablet Commonly known as: NORVASC Take 1 tablet (10 mg total) by mouth daily.   apixaban 5 MG Tabs tablet Commonly known as: ELIQUIS Take 1 tablet (5 mg total) by mouth 2 (two) times daily.   aspirin 81 MG EC tablet Take 1 tablet (81 mg total) by mouth daily. Swallow whole.   atorvastatin 80 MG tablet Commonly known as: LIPITOR Take 1 tablet (80 mg total) by mouth daily.   metFORMIN 500 MG 24 hr tablet Commonly known as:  Glucophage XR Take 1 tablet (500 mg total) by mouth daily with breakfast.   MULTIPLE VITAMIN PO Take 1 tablet by mouth daily.       Discharge Instructions: Please refer to Patient Instructions section of EMR for full details.  Patient was counseled important signs and symptoms that should prompt return to medical care, changes in medications, dietary instructions, activity restrictions, and follow up appointments.   Follow-Up Appointments:  Contact information for follow-up providers    Garvin Fila, MD. Schedule an appointment as soon as possible for a visit in 4 week(s).   Specialties: Neurology, Radiology Contact information: [address] [place] [address] [phone]            Contact information for after-discharge care    Destination    [facility] Preferred SNF .   Service: Skilled Nursing Contact information: [place] [address] [place], [place], DO 10/03/2019, 8:20 AM PGY-3, [place]

## 2019-10-05 ENCOUNTER — Emergency Department (HOSPITAL_COMMUNITY): Payer: Medicare Other

## 2019-10-05 ENCOUNTER — Other Ambulatory Visit: Payer: Self-pay

## 2019-10-05 ENCOUNTER — Inpatient Hospital Stay (HOSPITAL_COMMUNITY)
Admission: EM | Admit: 2019-10-05 | Discharge: 2019-10-14 | DRG: 686 | Disposition: A | Discharge status: Home Health | Payer: Medicare Other | Source: Skilled Nursing Facility | Attending: Family Medicine | Admitting: Family Medicine

## 2019-10-05 DIAGNOSIS — D62 Acute posthemorrhagic anemia: Secondary | ICD-10-CM | POA: Diagnosis present

## 2019-10-05 DIAGNOSIS — R6889 Other general symptoms and signs: Secondary | ICD-10-CM | POA: Diagnosis not present

## 2019-10-05 DIAGNOSIS — Z7984 Long term (current) use of oral hypoglycemic drugs: Secondary | ICD-10-CM

## 2019-10-05 DIAGNOSIS — C679 Malignant neoplasm of bladder, unspecified: Secondary | ICD-10-CM | POA: Diagnosis not present

## 2019-10-05 DIAGNOSIS — E7800 Pure hypercholesterolemia, unspecified: Secondary | ICD-10-CM | POA: Diagnosis not present

## 2019-10-05 DIAGNOSIS — D259 Leiomyoma of uterus, unspecified: Secondary | ICD-10-CM | POA: Diagnosis present

## 2019-10-05 DIAGNOSIS — R310 Gross hematuria: Secondary | ICD-10-CM

## 2019-10-05 DIAGNOSIS — Z7902 Long term (current) use of antithrombotics/antiplatelets: Secondary | ICD-10-CM | POA: Diagnosis not present

## 2019-10-05 DIAGNOSIS — I824Z3 Acute embolism and thrombosis of unspecified deep veins of distal lower extremity, bilateral: Secondary | ICD-10-CM

## 2019-10-05 DIAGNOSIS — Z87891 Personal history of nicotine dependence: Secondary | ICD-10-CM | POA: Diagnosis not present

## 2019-10-05 DIAGNOSIS — E876 Hypokalemia: Secondary | ICD-10-CM | POA: Diagnosis not present

## 2019-10-05 DIAGNOSIS — Z7982 Long term (current) use of aspirin: Secondary | ICD-10-CM | POA: Diagnosis not present

## 2019-10-05 DIAGNOSIS — I82403 Acute embolism and thrombosis of unspecified deep veins of lower extremity, bilateral: Secondary | ICD-10-CM | POA: Diagnosis not present

## 2019-10-05 DIAGNOSIS — N200 Calculus of kidney: Secondary | ICD-10-CM | POA: Diagnosis not present

## 2019-10-05 DIAGNOSIS — I10 Essential (primary) hypertension: Secondary | ICD-10-CM | POA: Diagnosis not present

## 2019-10-05 DIAGNOSIS — R319 Hematuria, unspecified: Secondary | ICD-10-CM | POA: Diagnosis present

## 2019-10-05 DIAGNOSIS — Z7901 Long term (current) use of anticoagulants: Secondary | ICD-10-CM

## 2019-10-05 DIAGNOSIS — A419 Sepsis, unspecified organism: Secondary | ICD-10-CM

## 2019-10-05 DIAGNOSIS — R4701 Aphasia: Secondary | ICD-10-CM | POA: Diagnosis not present

## 2019-10-05 DIAGNOSIS — I2510 Atherosclerotic heart disease of native coronary artery without angina pectoris: Secondary | ICD-10-CM | POA: Diagnosis present

## 2019-10-05 DIAGNOSIS — R58 Hemorrhage, not elsewhere classified: Secondary | ICD-10-CM | POA: Diagnosis not present

## 2019-10-05 DIAGNOSIS — E861 Hypovolemia: Secondary | ICD-10-CM | POA: Diagnosis present

## 2019-10-05 DIAGNOSIS — Z6830 Body mass index (BMI) 30.0-30.9, adult: Secondary | ICD-10-CM

## 2019-10-05 DIAGNOSIS — Z86718 Personal history of other venous thrombosis and embolism: Secondary | ICD-10-CM | POA: Diagnosis not present

## 2019-10-05 DIAGNOSIS — N179 Acute kidney failure, unspecified: Secondary | ICD-10-CM | POA: Diagnosis not present

## 2019-10-05 DIAGNOSIS — E860 Dehydration: Secondary | ICD-10-CM | POA: Diagnosis present

## 2019-10-05 DIAGNOSIS — Z79899 Other long term (current) drug therapy: Secondary | ICD-10-CM | POA: Diagnosis not present

## 2019-10-05 DIAGNOSIS — E119 Type 2 diabetes mellitus without complications: Secondary | ICD-10-CM | POA: Diagnosis present

## 2019-10-05 DIAGNOSIS — Z801 Family history of malignant neoplasm of trachea, bronchus and lung: Secondary | ICD-10-CM | POA: Diagnosis not present

## 2019-10-05 DIAGNOSIS — E785 Hyperlipidemia, unspecified: Secondary | ICD-10-CM | POA: Diagnosis present

## 2019-10-05 DIAGNOSIS — Z743 Need for continuous supervision: Secondary | ICD-10-CM | POA: Diagnosis not present

## 2019-10-05 DIAGNOSIS — F419 Anxiety disorder, unspecified: Secondary | ICD-10-CM | POA: Diagnosis not present

## 2019-10-05 DIAGNOSIS — E872 Acidosis: Secondary | ICD-10-CM | POA: Diagnosis present

## 2019-10-05 DIAGNOSIS — N029 Recurrent and persistent hematuria with unspecified morphologic changes: Secondary | ICD-10-CM | POA: Diagnosis not present

## 2019-10-05 DIAGNOSIS — E43 Unspecified severe protein-calorie malnutrition: Secondary | ICD-10-CM | POA: Diagnosis present

## 2019-10-05 DIAGNOSIS — Z8249 Family history of ischemic heart disease and other diseases of the circulatory system: Secondary | ICD-10-CM | POA: Diagnosis not present

## 2019-10-05 DIAGNOSIS — Z20822 Contact with and (suspected) exposure to covid-19: Secondary | ICD-10-CM | POA: Diagnosis present

## 2019-10-05 DIAGNOSIS — N390 Urinary tract infection, site not specified: Secondary | ICD-10-CM | POA: Diagnosis not present

## 2019-10-05 DIAGNOSIS — I6381 Other cerebral infarction due to occlusion or stenosis of small artery: Secondary | ICD-10-CM

## 2019-10-05 DIAGNOSIS — C801 Malignant (primary) neoplasm, unspecified: Secondary | ICD-10-CM | POA: Diagnosis not present

## 2019-10-05 LAB — CBC WITH DIFFERENTIAL/PLATELET
Abs Immature Granulocytes: 0.13 10*3/uL — ABNORMAL HIGH (ref 0.00–0.07)
Basophils Absolute: 0 10*3/uL (ref 0.0–0.1)
Basophils Relative: 0 %
Eosinophils Absolute: 0 10*3/uL (ref 0.0–0.5)
Eosinophils Relative: 0 %
HCT: 31.2 % — ABNORMAL LOW (ref 36.0–46.0)
Hemoglobin: 9.4 g/dL — ABNORMAL LOW (ref 12.0–15.0)
Immature Granulocytes: 1 %
Lymphocytes Relative: 6 %
Lymphs Abs: 1.2 10*3/uL (ref 0.7–4.0)
MCH: 27.5 pg (ref 26.0–34.0)
MCHC: 30.1 g/dL (ref 30.0–36.0)
MCV: 91.2 fL (ref 80.0–100.0)
Monocytes Absolute: 1.2 10*3/uL — ABNORMAL HIGH (ref 0.1–1.0)
Monocytes Relative: 6 %
Neutro Abs: 16 10*3/uL — ABNORMAL HIGH (ref 1.7–7.7)
Neutrophils Relative %: 87 %
Platelets: 468 10*3/uL — ABNORMAL HIGH (ref 150–400)
RBC: 3.42 MIL/uL — ABNORMAL LOW (ref 3.87–5.11)
RDW: 16.3 % — ABNORMAL HIGH (ref 11.5–15.5)
WBC: 18.5 10*3/uL — ABNORMAL HIGH (ref 4.0–10.5)
nRBC: 0.1 % (ref 0.0–0.2)

## 2019-10-05 LAB — CBG MONITORING, ED: Glucose-Capillary: 202 mg/dL — ABNORMAL HIGH (ref 70–99)

## 2019-10-05 LAB — COMPREHENSIVE METABOLIC PANEL
ALT: 16 U/L (ref 0–44)
AST: 21 U/L (ref 15–41)
Albumin: 2.5 g/dL — ABNORMAL LOW (ref 3.5–5.0)
Alkaline Phosphatase: 90 U/L (ref 38–126)
Anion gap: 16 — ABNORMAL HIGH (ref 5–15)
BUN: 11 mg/dL (ref 8–23)
CO2: 21 mmol/L — ABNORMAL LOW (ref 22–32)
Calcium: 8.8 mg/dL — ABNORMAL LOW (ref 8.9–10.3)
Chloride: 101 mmol/L (ref 98–111)
Creatinine, Ser: 1.22 mg/dL — ABNORMAL HIGH (ref 0.44–1.00)
GFR calc Af Amer: 53 mL/min — ABNORMAL LOW (ref >60)
GFR calc non Af Amer: 46 mL/min — ABNORMAL LOW (ref >60)
Glucose, Bld: 261 mg/dL — ABNORMAL HIGH (ref 70–99)
Potassium: 3.2 mmol/L — ABNORMAL LOW (ref 3.5–5.1)
Sodium: 138 mmol/L (ref 135–145)
Total Bilirubin: 0.6 mg/dL (ref 0.3–1.2)
Total Protein: 5.8 g/dL — ABNORMAL LOW (ref 6.5–8.1)

## 2019-10-05 LAB — LACTIC ACID, PLASMA: Lactic Acid, Venous: 5 mmol/L (ref 0.5–1.9)

## 2019-10-05 LAB — PROTIME-INR
INR: 1.9 — ABNORMAL HIGH (ref 0.8–1.2)
Prothrombin Time: 21 seconds — ABNORMAL HIGH (ref 11.4–15.2)

## 2019-10-05 IMAGING — CT CT ABD-PELV W/ CM
2 of 5 series · 16 of 46 positions shown, 18 images · IV contrast (Omni 300)
Comparison: [DATE]

CLINICAL DATA: Hematuria. Bladder cancer. Bladder surgery 2 weeks
ago.

EXAM:
CT ABDOMEN AND PELVIS WITH CONTRAST
TECHNIQUE: Multidetector CT imaging of the abdomen and pelvis was performed
using the standard protocol following bolus administration of
intravenous contrast.
CONTRAST:  100mL OMNIPAQUE IOHEXOL 300 MG/ML  SOLN

[Series 3: a/p w/ 5mm · axial · 0.88mm/px · z∈[-430,-10]mm · 13 of 94 slices shown, 15 images]
[im 5/94  soft-tissue]
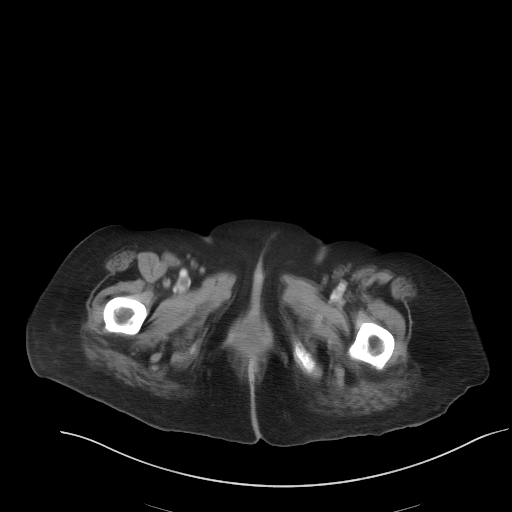
[im 5/94  bone]
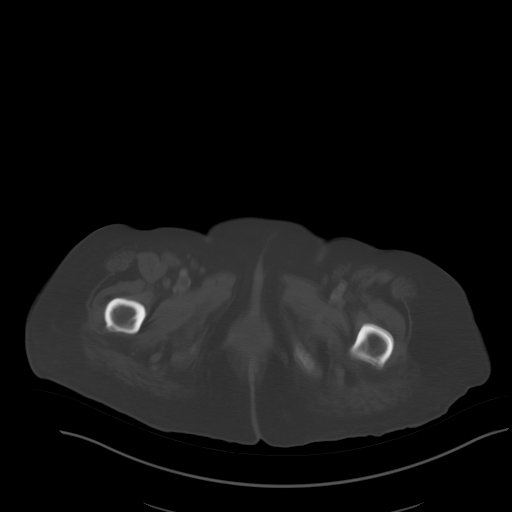
[im 14/94  soft-tissue]
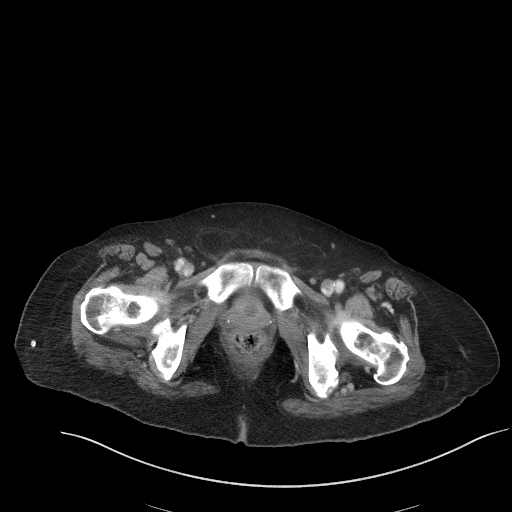
[im 19/94  soft-tissue]
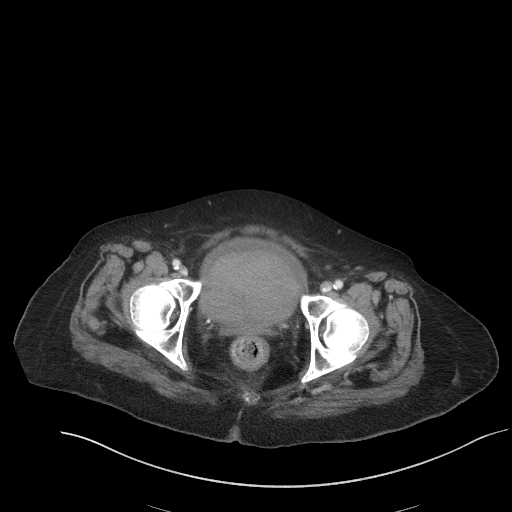
[im 28/94  soft-tissue]
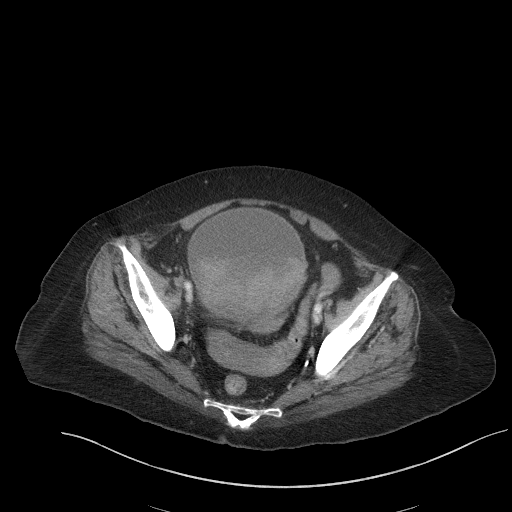
[im 33/94  soft-tissue]
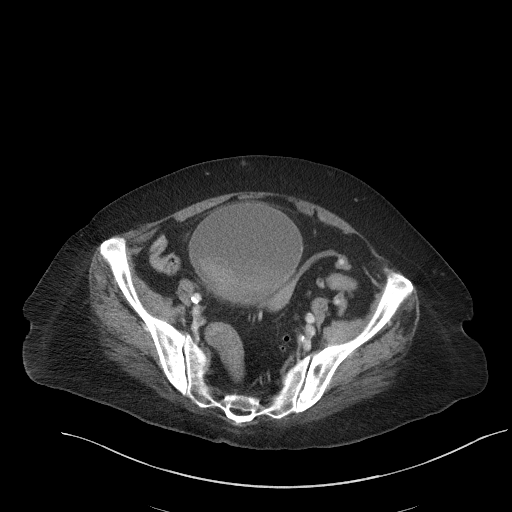
[im 42/94  soft-tissue]
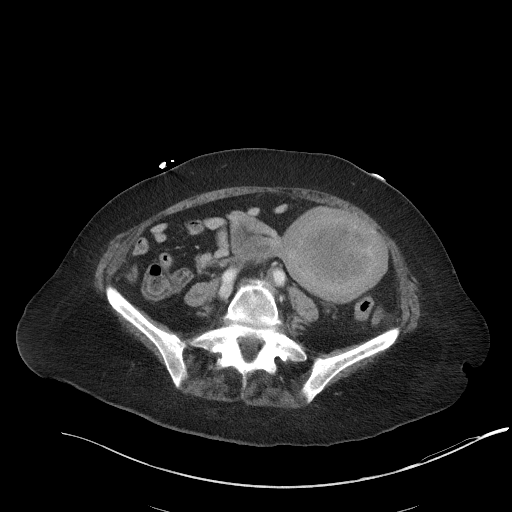
[im 47/94  soft-tissue]
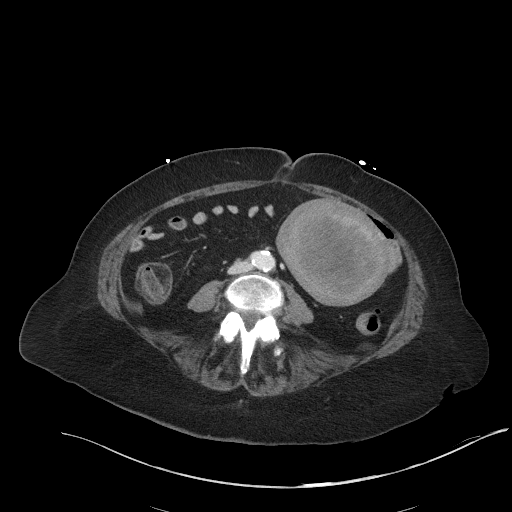
[im 52/94  soft-tissue]
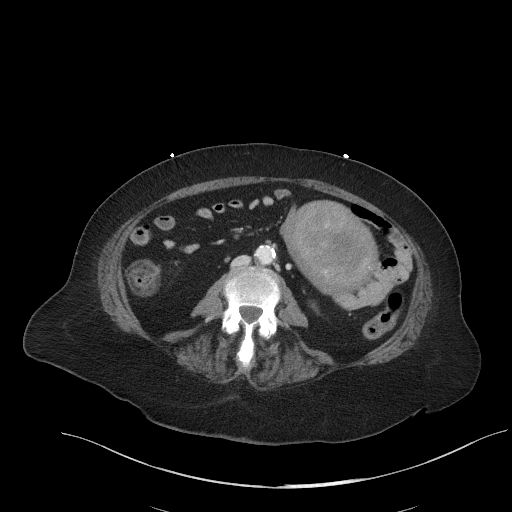
[im 61/94  soft-tissue]
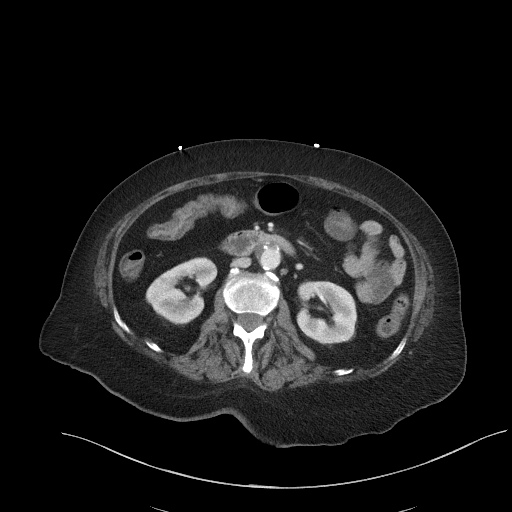
[im 61/94  bone]
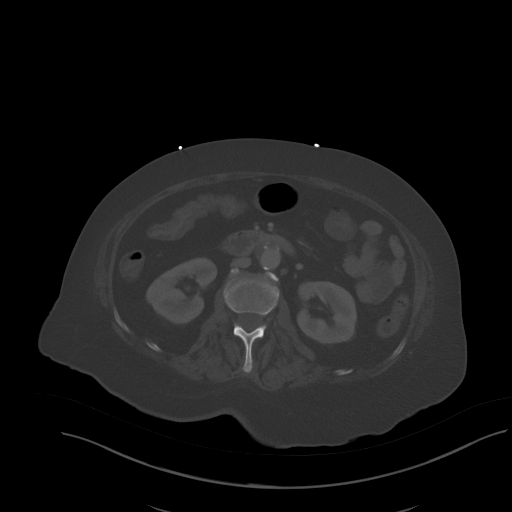
[im 66/94  soft-tissue]
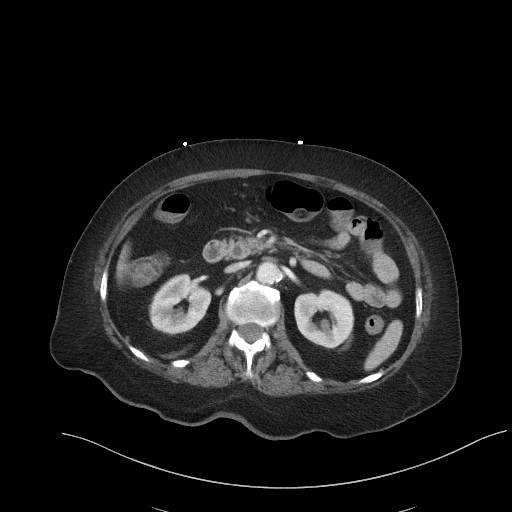
[im 75/94  soft-tissue]
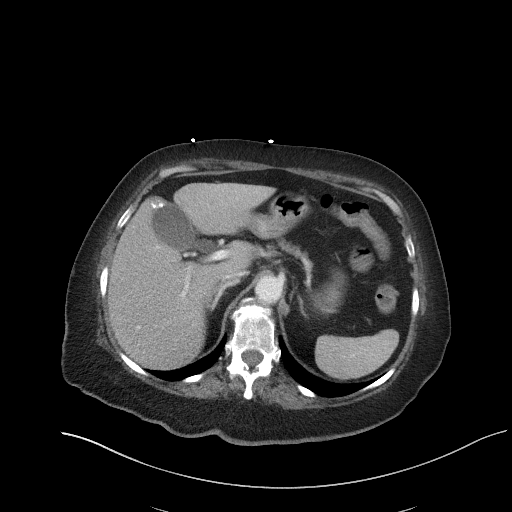
[im 80/94  soft-tissue]
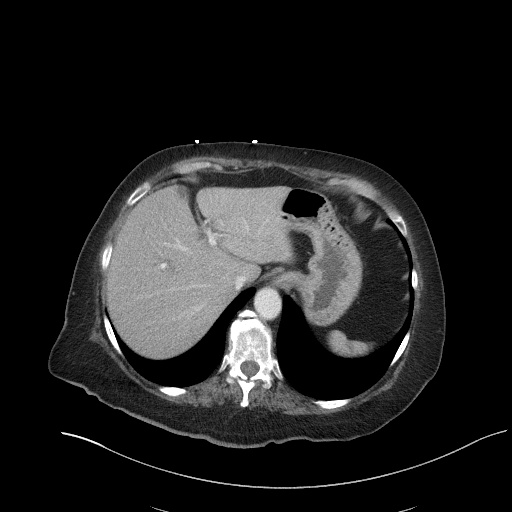
[im 89/94  soft-tissue]
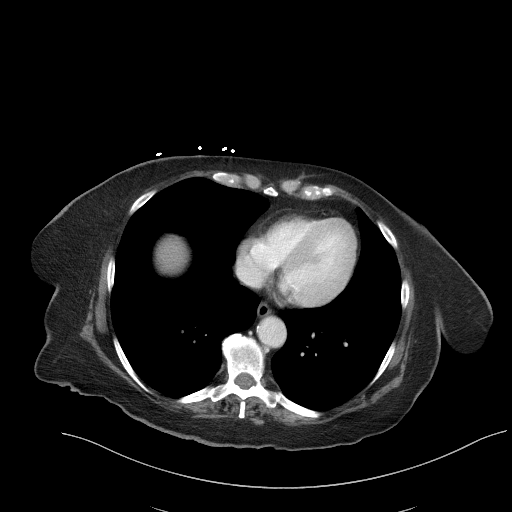

[Series 6: a/p w/ cor · coronal · 0.92mm/px · 3 of 147 slices shown]
[im 49/147  soft-tissue]
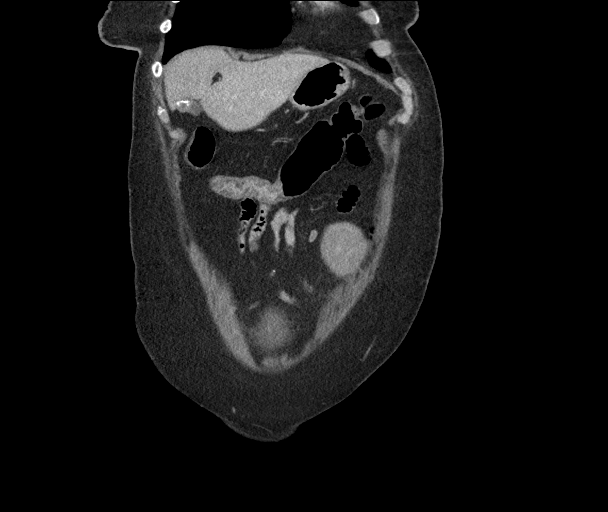
[im 65/147  soft-tissue]
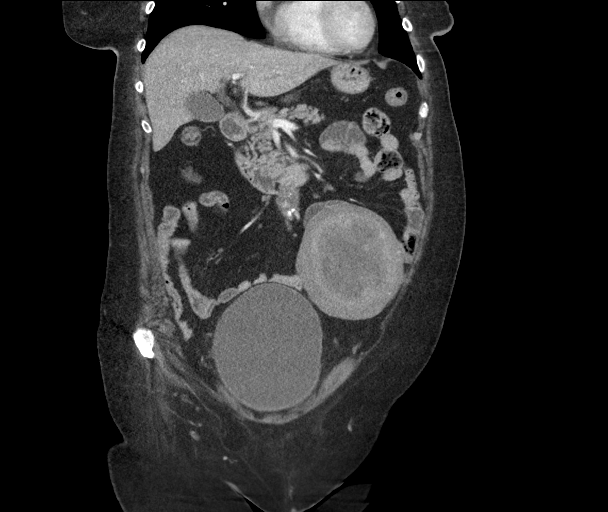
[im 82/147  soft-tissue]
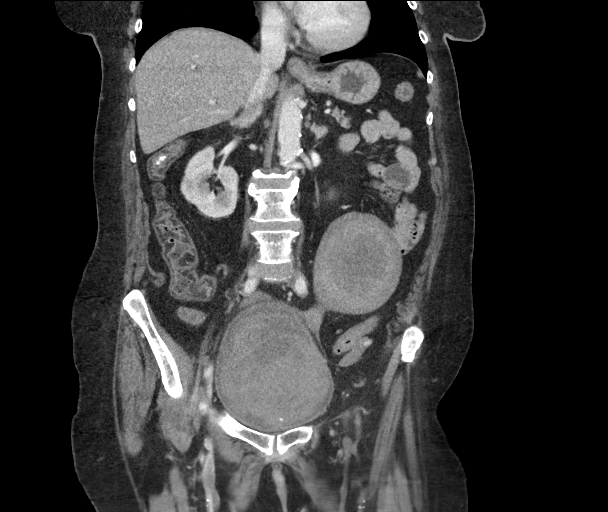

[16 of 46 positions shown; findings below may reference images not displayed]

FINDINGS: Lower chest: Lung bases are clear. No effusions. Heart is normal
size.

Hepatobiliary: Area of wall calcification and thickening at the
gallbladder fundus is stable. No focal hepatic abnormality.

Pancreas: No focal abnormality or ductal dilatation.

Spleen: No focal abnormality.  Normal size.

Adrenals/Urinary Tract: Left adrenal thickening, stable. Small
nonobstructing stone in the lower pole of the left kidney. No
hydronephrosis. Heterogeneous material seen within the bladder,
likely blood clot. There is a prominent vessel along the bladder
base with possible active extravasation into the bladder lumen.

Stomach/Bowel: Sigmoid diverticulosis. No active diverticulitis.
Fatty proliferation within the wall of the cecum and ascending
colon, likely related to prior inflammatory process. No evidence of
bowel obstruction.

Vascular/Lymphatic: Enhancing heterogeneous mass off the uterine
fundus is again noted, likely large fibroid measuring up to 8.6 cm.

Reproductive: Aortic atherosclerosis. Irregular noncalcified plaque
in the infrarenal aorta which measures up to 2.8 cm.

Other: No free fluid or free air.

Musculoskeletal: No acute bony abnormality.
IMPRESSION: Heterogeneous material within the bladder, likely blood clot. There
is a prominent vessel near the bladder base with possible active
extravasation into the bladder lumen.

Sigmoid diverticulosis.

Aortic atherosclerosis with irregular plaque in the infrarenal
aorta.

Left nephrolithiasis.

Large exophytic fibroid off the uterine fundus.

## 2019-10-05 IMAGING — DX DG CHEST 1V PORT
1 series · 1 of 1 positions shown · non-contrast
Comparison: [DATE]

CLINICAL DATA: Sepsis

EXAM:
PORTABLE CHEST 1 VIEW

[chest ap]
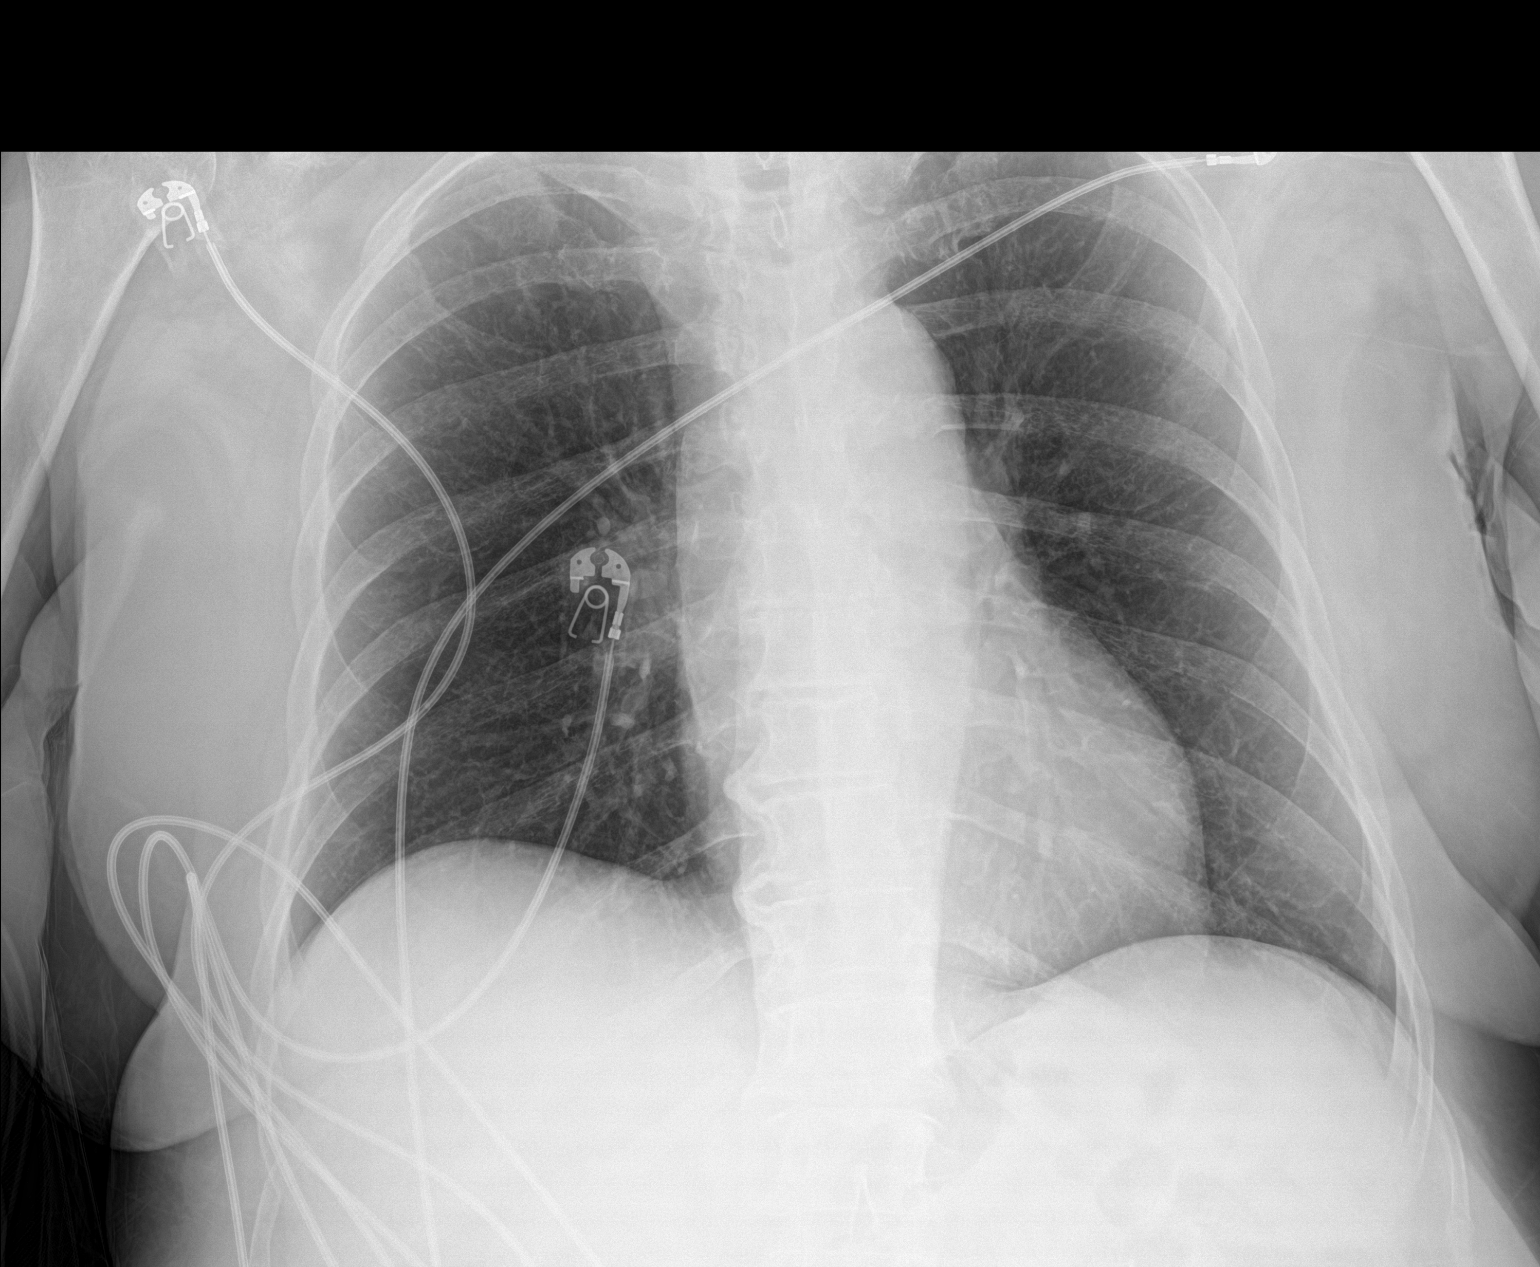

[1 of 1 positions shown; findings below may reference images not displayed]

FINDINGS: The heart size and mediastinal contours are within normal limits.
Both lungs are clear. The visualized skeletal structures are
unremarkable.
IMPRESSION: No active disease.

## 2019-10-05 MED ORDER — MORPHINE SULFATE (PF) 4 MG/ML IV SOLN: 4.0000 mg | Freq: Once | INTRAVENOUS | Status: AC

## 2019-10-05 MED ORDER — INSULIN ASPART 100 UNIT/ML ~~LOC~~ SOLN
0.0000 [IU] | Freq: Three times a day (TID) | SUBCUTANEOUS | Status: DC
  Administered 2019-10-10: 2 [IU] via SUBCUTANEOUS
  Administered 2019-10-11: 1 [IU] via SUBCUTANEOUS
  Administered 2019-10-11 – 2019-10-12 (×2): 2 [IU] via SUBCUTANEOUS
  Administered 2019-10-12: 1 [IU] via SUBCUTANEOUS
  Administered 2019-10-12: 2 [IU] via SUBCUTANEOUS
  Administered 2019-10-13: 1 [IU] via SUBCUTANEOUS

## 2019-10-05 MED ORDER — VANCOMYCIN HCL 750 MG/150ML IV SOLN
750.0000 mg | Freq: Two times a day (BID) | INTRAVENOUS | Status: DC
  Filled 2019-10-05: qty 150

## 2019-10-05 MED ORDER — IOHEXOL 300 MG/ML  SOLN
100.0000 mL | Freq: Once | INTRAMUSCULAR | Status: AC | PRN
  Administered 2019-10-05: 100 mL via INTRAVENOUS

## 2019-10-05 MED ORDER — SODIUM CHLORIDE 0.9 % IV SOLN: 2.0000 g | Freq: Once | INTRAVENOUS | Status: DC

## 2019-10-05 MED ORDER — VANCOMYCIN HCL 2000 MG/400ML IV SOLN
2000.0000 mg | Freq: Once | INTRAVENOUS | Status: AC
  Administered 2019-10-05: 2000 mg via INTRAVENOUS
  Filled 2019-10-05 (×2): qty 400

## 2019-10-05 MED ORDER — SODIUM CHLORIDE 0.9 % IV BOLUS (SEPSIS)
1000.0000 mL | Freq: Once | INTRAVENOUS | Status: AC
  Administered 2019-10-05: 1000 mL via INTRAVENOUS

## 2019-10-05 MED ORDER — SODIUM CHLORIDE 0.9 % IV SOLN
2.0000 g | Freq: Two times a day (BID) | INTRAVENOUS | Status: DC
  Administered 2019-10-05 – 2019-10-07 (×4): 2 g via INTRAVENOUS
  Filled 2019-10-05 (×4): qty 2

## 2019-10-05 MED ORDER — SODIUM CHLORIDE 0.9 % IV SOLN
1000.0000 mL | INTRAVENOUS | Status: DC
  Administered 2019-10-06 – 2019-10-08 (×6): 1000 mL via INTRAVENOUS

## 2019-10-05 MED ORDER — LACTATED RINGERS IV BOLUS (SEPSIS): 1000.0000 mL | Freq: Once | INTRAVENOUS | Status: DC

## 2019-10-05 MED ORDER — HEPARIN (PORCINE) 25000 UT/250ML-% IV SOLN
1250.0000 [IU]/h | INTRAVENOUS | Status: DC
  Administered 2019-10-06: 1250 [IU]/h via INTRAVENOUS
  Filled 2019-10-05: qty 250

## 2019-10-05 MED ORDER — MORPHINE SULFATE (PF) 4 MG/ML IV SOLN
INTRAVENOUS | Status: AC
  Administered 2019-10-05: 4 mg via INTRAVENOUS
  Filled 2019-10-05: qty 1

## 2019-10-05 MED ORDER — FENTANYL CITRATE (PF) 100 MCG/2ML IJ SOLN
50.0000 ug | Freq: Once | INTRAMUSCULAR | Status: AC
  Administered 2019-10-05: 50 ug via INTRAVENOUS
  Filled 2019-10-05: qty 2

## 2019-10-05 MED ORDER — SODIUM CHLORIDE 0.9 % IR SOLN
3000.0000 mL | Status: DC
  Administered 2019-10-05: 3000 mL

## 2019-10-05 MED ORDER — POTASSIUM CHLORIDE CRYS ER 20 MEQ PO TBCR: 40.0000 meq | EXTENDED_RELEASE_TABLET | Freq: Once | ORAL | Status: DC

## 2019-10-05 NOTE — ED Provider Notes (Addendum)
[facility] EMERGENCY DEPARTMENT Provider Note   CSN: [id number] Arrival date & time: 10/05/19  1448     History Chief Complaint  Patient presents with  . Post-op Problem    Lisa [name] is a 69 y.o. female.  The history is provided by the patient, the spouse and medical records. No language interpreter was used.     69 year old female significant history of CVA with residual neurological deficit, hypertension, hyperlipidemia, diabetes, with recent hospitalization for gross hematuria and blood loss anemia secondary to papillary carcinoma of the bladder presenting today for evaluation of postop problem.  History obtained through husband who is at bedside.  Patient was previously admitted to the hospital on 8/5 and discharged on 8/21 for high-grade papillary carcinoma of the bladder with resulting acute blood loss anemia from bladder bleeding.  She received blood transfusion.  During hospitalization she also developed acute lacunar infarct as well as bilateral lower extremity DVT and presumed PE.  Patient with prolonged hospital stay, subsequently discharged to a skilled nursing facility 2 days ago.  She was doing fine however patient brought here due to having recurrent bleeding at the surgical site.  Patient is a poor historian.  She does not have any active complaint at this time.  She does admits noticing bleeding but denies having lightheadedness or dizziness denies chest pain or trouble breathing denies abdominal pain or dysuria.  She denies having fever or chills.  Past Medical History:  Diagnosis Date  . Carotid arterial disease ([place]) 07/2019  . Diabetes ([place])   . HTN (hypertension)   . Hyperlipidemia   . Stroke [facility])    acute/subacute left MCA infarct 08/12/19    Patient Active Problem List   Diagnosis Date Noted  . Acute deep vein thrombosis (DVT) of proximal vein of both lower extremities (HCC)   . Symptomatic anemia   . Weakness generalized   .  Aphasia   . Palliative care by specialist   . DNR (do not resuscitate) discussion   . Primary papillary carcinoma of bladder ([place]) 09/17/2019  . Lacunar infarct, acute ([place]) 09/17/2019  . Acute blood loss anemia 09/17/2019  . Pressure injury of skin 09/07/2019  . Gross hematuria 09/01/2019  . Hematuria 08/31/2019  . 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 Stenosis of carotid artery   . Abnormality of aortic valve   . DM (diabetes mellitus), type 2, uncontrolled w/neurologic complication ([place])   . Hypercholesteremia   . CVA (cerebral vascular accident) ([place]) 08/12/2019  . Acute ischemic stroke ([place])   . Hypertension     Past Surgical History:  Procedure Laterality Date  . CYSTOSCOPY WITH FULGERATION N/A 09/01/2019   Procedure: CYSTOSCOPY CLOT EVACUATION WITH POSSIBLE FULGERATION;  Surgeon: Robley Fries, MD;  Location: [place];  Service: Urology;  Laterality: N/A;  . CYSTOSCOPY WITH FULGERATION N/A 09/28/2019   Procedure: [place] AND CLOT EVACUATION;  Surgeon: Robley Fries, MD;  Location: [facility];  Service: Urology;  Laterality: N/A;  . TONSILLECTOMY    . TRANSCAROTID ARTERY REVASCULARIZATION Left 08/20/2019   Procedure: LEFT TRANSCAROTID ARTERY REVASCULARIZATION;  Surgeon: Waynetta Sandy, MD;  Location: [facility];  Service: Vascular;  Laterality: Left;  . TRANSURETHRAL RESECTION OF BLADDER TUMOR WITH MITOMYCIN-C N/A 09/22/2019   Procedure: TRANSURETHRAL RESECTION OF BLADDER TUMOR WITH INTRAVESICAL GEMCITABINE;  Surgeon: Robley Fries, MD;  Location: [facility];  Service: Urology;  Laterality: N/A;  90 MINS  OB History   No obstetric history on file.     Family History  Problem Relation Age of Onset  . Hypertension Mother   . Lung cancer Father     Social History   Tobacco Use  . Smoking status: Former Smoker    Packs/day: 0.50    Types: Cigarettes    Quit date: 07/28/2019    Years since  quitting: 0.1  . Smokeless tobacco: Never Used  Vaping Use  . Vaping Use: Never used  Substance Use Topics  . Alcohol use: Not Currently  . Drug use: Never    Home Medications Prior to Admission medications   Medication Sig Start Date End Date Taking? Authorizing Provider  amLODipine (NORVASC) 10 MG tablet Take 1 tablet (10 mg total) by mouth daily. Patient taking differently: Take 10 mg by mouth in the morning.  09/08/19  Yes Beard, Ralph Leyden, DO  apixaban (ELIQUIS) 5 MG TABS tablet Take 1 tablet (5 mg total) by mouth 2 (two) times daily. 10/03/19  Yes Daisy Floro, DO  aspirin EC 81 MG EC tablet Take 1 tablet (81 mg total) by mouth daily. Swallow whole. 09/08/19  Yes Beard, Samantha N, DO  atorvastatin (LIPITOR) 80 MG tablet Take 1 tablet (80 mg total) by mouth daily. Patient taking differently: Take 80 mg by mouth at bedtime.  08/15/19  Yes Autry-Lott, Naaman Plummer, DO  clopidogrel (PLAVIX) 75 MG tablet Take 75 mg by mouth in the morning.    Yes [provider]  losartan (COZAAR) 25 MG tablet Take 25 mg by mouth in the morning.    Yes [provider]  metFORMIN (GLUCOPHAGE XR) 500 MG 24 hr tablet Take 1 tablet (500 mg total) by mouth daily with breakfast. 08/14/19  Yes Autry-Lott, Naaman Plummer, DO  metoprolol tartrate (LOPRESSOR) 25 MG tablet Take 25 mg by mouth See admin instructions. Take 25 mg by mouth in the morning and hold if B/P is less than 110 OR heart rate is less than 60 BPM   Yes [provider]  MULTIPLE VITAMIN PO Take 1 tablet by mouth daily.   Yes [provider]    Allergies    Patient has no known allergies.  Review of Systems   Review of Systems  Unable to perform ROS: Mental status change    Physical Exam Updated Vital Signs BP 109/70   Pulse 91   Temp 98.2 F (36.8 C)   Resp (!) 22   SpO2 100%   Physical Exam Vitals and nursing note reviewed. Exam conducted with a chaperone present.  Constitutional:      General: She is not  in acute distress.    Appearance: She is well-developed. She is ill-appearing.     Comments: Chronically ill-appearing female, pale in appearance in no acute discomfort.  HENT:     Head: Atraumatic.  Eyes:     Conjunctiva/sclera: Conjunctivae normal.  Cardiovascular:     Rate and Rhythm: Normal rate and regular rhythm.     Pulses: Normal pulses.     Heart sounds: Normal heart sounds.  Pulmonary:     Effort: Pulmonary effort is normal.     Breath sounds: Normal breath sounds.  Abdominal:     Palpations: Abdomen is soft.     Tenderness: There is no abdominal tenderness.  Genitourinary:    Comments: Warden/ranger, RN was available.  Blood noted in vaginal vault.  Normal color stool on adult diaper.  No obvious wound. Musculoskeletal:  Cervical back: Neck supple. No rigidity.     Comments: Able to move all 4 extremities.  Skin:    Coloration: Skin is pale.     Findings: No rash.  Neurological:     Mental Status: She is alert. She is disoriented.  Psychiatric:        Mood and Affect: Mood normal.     ED Results / Procedures / Treatments   Labs (all labs ordered are listed, but only abnormal results are displayed) Labs Reviewed  COMPREHENSIVE METABOLIC PANEL - Abnormal; Notable for the following components:      Result Value   Potassium 3.2 (*)    CO2 21 (*)    Glucose, Bld 261 (*)    Creatinine, Ser 1.22 (*)    Calcium 8.8 (*)    Total Protein 5.8 (*)    Albumin 2.5 (*)    GFR calc non Af Amer 46 (*)    GFR calc Af Amer 53 (*)    Anion gap 16 (*)    All other components within normal limits  LACTIC ACID, PLASMA - Abnormal; Notable for the following components:   Lactic Acid, Venous 5.0 (*)    All other components within normal limits  CBC WITH DIFFERENTIAL/PLATELET - Abnormal; Notable for the following components:   WBC 18.5 (*)    RBC 3.42 (*)    Hemoglobin 9.4 (*)    HCT 31.2 (*)    RDW 16.3 (*)    Platelets 468 (*)    Neutro Abs 16.0 (*)    Monocytes  Absolute 1.2 (*)    Abs Immature Granulocytes 0.13 (*)    All other components within normal limits  PROTIME-INR - Abnormal; Notable for the following components:   Prothrombin Time 21.0 (*)    INR 1.9 (*)    All other components within normal limits  CBG MONITORING, ED - Abnormal; Notable for the following components:   Glucose-Capillary 202 (*)    All other components within normal limits  CULTURE, BLOOD (ROUTINE X 2)  CULTURE, BLOOD (ROUTINE X 2)  SARS CORONAVIRUS 2 BY RT PCR (HOSPITAL ORDER, [facility])  CULTURE, BLOOD (SINGLE)  URINE CULTURE  LACTIC ACID, PLASMA  URINALYSIS, ROUTINE W REFLEX MICROSCOPIC  APTT  BASIC METABOLIC PANEL    EKG None  Radiology CT ABDOMEN PELVIS W CONTRAST  Result Date: 10/05/2019 CLINICAL DATA:  Hematuria. Bladder cancer. Bladder surgery 2 weeks ago. EXAM: CT ABDOMEN AND PELVIS WITH CONTRAST TECHNIQUE: Multidetector CT imaging of the abdomen and pelvis was performed using the standard protocol following bolus administration of intravenous contrast. CONTRAST:  164mL OMNIPAQUE IOHEXOL 300 MG/ML  SOLN COMPARISON:  09/17/2018 FINDINGS: Lower chest: Lung bases are clear. No effusions. Heart is normal size. Hepatobiliary: Area of wall calcification and thickening at the gallbladder fundus is stable. No focal hepatic abnormality. Pancreas: No focal abnormality or ductal dilatation. Spleen: No focal abnormality.  Normal size. Adrenals/Urinary Tract: Left adrenal thickening, stable. Small nonobstructing stone in the lower pole of the left kidney. No hydronephrosis. Heterogeneous material seen within the bladder, likely blood clot. There is a prominent vessel along the bladder base with possible active extravasation into the bladder lumen. Stomach/Bowel: Sigmoid diverticulosis. No active diverticulitis. Fatty proliferation within the wall of the cecum and ascending colon, likely related to prior inflammatory process. No evidence of bowel  obstruction. Vascular/Lymphatic: Enhancing heterogeneous mass off the uterine fundus is again noted, likely large fibroid measuring up to 8.6 cm. Reproductive: Aortic  atherosclerosis. Irregular noncalcified plaque in the infrarenal aorta which measures up to 2.8 cm. Other: No free fluid or free air. Musculoskeletal: No acute bony abnormality. IMPRESSION: Heterogeneous material within the bladder, likely blood clot. There is a prominent vessel near the bladder base with possible active extravasation into the bladder lumen. Sigmoid diverticulosis. Aortic atherosclerosis with irregular plaque in the infrarenal aorta. Left nephrolithiasis. Large exophytic fibroid off the uterine fundus. Electronically Signed   By: Rolm Baptise M.D.   On: 10/05/2019 19:59    Procedures .Critical Care Performed by: Domenic Moras, PA-C Authorized by: Domenic Moras, PA-C   Critical care provider statement:    Critical care time (minutes):  32   Critical care was time spent personally by me on the following activities:  Discussions with consultants, evaluation of patient's response to treatment, examination of patient, ordering and performing treatments and interventions, ordering and review of laboratory studies, ordering and review of radiographic studies, pulse oximetry, re-evaluation of patient's condition, obtaining history from patient or surrogate and review of old charts   (including critical care time)  Medications Ordered in ED Medications  sodium chloride 0.9 % bolus 1,000 mL (1,000 mLs Intravenous New Bag/Given 10/05/19 1922)    Followed by  sodium chloride 0.9 % bolus 1,000 mL (has no administration in time range)    Followed by  sodium chloride 0.9 % bolus 1,000 mL (has no administration in time range)    Followed by  0.9 %  sodium chloride infusion (has no administration in time range)  ceFEPIme (MAXIPIME) 2 g in sodium chloride 0.9 % 100 mL IVPB (has no administration in time range)  vancomycin (VANCOREADY)  IVPB 2000 mg/400 mL (2,000 mg Intravenous New Bag/Given 10/05/19 2018)  vancomycin (VANCOREADY) IVPB 750 mg/150 mL (has no administration in time range)  sodium chloride irrigation 0.9 % 3,000 mL (has no administration in time range)  fentaNYL (SUBLIMAZE) injection 50 mcg (50 mcg Intravenous Given 10/05/19 1922)  iohexol (OMNIPAQUE) 300 MG/ML solution 100 mL (100 mLs Intravenous Contrast Given 10/05/19 1933)    ED Course  I have reviewed the triage vital signs and the nursing notes.  Pertinent labs & imaging results that were available during my care of the patient were reviewed by me and considered in my medical decision making (see chart for details).    MDM Rules/Calculators/A&P                          BP 104/74   Pulse 88   Temp 98.2 F (36.8 C)   Resp 16   SpO2 100%   Final Clinical Impression(s) / ED Diagnoses Final diagnoses:  Sepsis, due to unspecified organism, unspecified whether acute organ dysfunction present ([place])  Gross hematuria    Rx / DC Orders ED Discharge Orders    None     7:11 PM Patient with history of papillary carcinoma of the cancer previously diagnosed a month ago resulting in acute blood loss anemia from blood bleeding, who received blood transfusion and prolonged hospital stay discharged to nursing skilled facility 2 days ago is presenting with hematuria.  She is pale in appearance, and does endorse some abdominal discomfort.  Labs remarkable for mild AKI with creatinine of 1.22, mild hypokalemia with potassium of 3.2, elevated white count of 18.5, elevated lactic acid of 5.0, hemoglobin is 9.4.  Due to recurrent CT of her hematuria and having abdominal pain along with elevated white count and elevated lactic acid, will  obtain abdominal and pelvis CT scan for further evaluation.  She is currently afebrile.  Patient however meets sepsis criteria, will give IV fluid at 30 mL/kg along with broad-spectrum antibiotic.  8:27 PM  Pt recently had cystoscopy  with fulgeration and clot evaluation by Dr. Claudia Desanctis on 8/16. Appreciate consultation from urologist Dr. Lovena Neighbours who request placement of 3 way catheter for bladder irrigation and to consult medicine for admission. He request for the foley balloon to have 30cc of saline to help with tamponade.  He also request for hand irrigation. I have notified our nurse with the request. Care discussed with Dr. Maryan Rued.   Sepsis reassessment done.  Sepsis of unknown source with initiation of broad spectrum abx.  No headache or nuchal rigidity to suggest meningitis.    9:28 PM Appreciate consultation from internal medicine resident who agrees to see and admit pt.  She request CXR to be perform.  Will order.  Pt is currently stable.    Lisa [name] was evaluated in Emergency Department on 10/05/2019 for the symptoms described in the history of present illness. She was evaluated in the context of the global COVID-19 pandemic, which necessitated consideration that the patient might be at risk for infection with the SARS-CoV-2 virus that causes COVID-19. Institutional protocols and algorithms that pertain to the evaluation of patients at risk for COVID-19 are in a state of rapid change based on information released by regulatory bodies including the CDC and federal and state organizations. These policies and algorithms were followed during the patient's care in the ED.    Domenic Moras, PA-C 10/05/19 2129    Domenic Moras, PA-C 10/05/19 2131    Blanchie Dessert, MD 10/05/19 [phone]

## 2019-10-05 NOTE — ED Triage Notes (Signed)
Pt arrives via EMS from [facility], had bladder surgery 2 weeks ago, began having surgical site bleeding per staff since yesterday. Bleeding controlled with dressing. Pt alert, oriented x4. VSS. NAD at present.

## 2019-10-05 NOTE — Consult Note (Signed)
Urology Consult   Physician requesting consult: Blanchie Dessert, MD  Reason for consult:  Gross hematuria  History of Present Illness: Lisa [name] is a 69 y.o. 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 Claudia Desanctis, LE DVT requiring anticoagulation and CVA.  She presents from her care facility with a 48 hour history of worsening gross hematuria.  She states that she has been voiding without difficulty into a diaper for the past several days and denies urinary urgency, frequency, dysuria or suprapubic pain.  CT from this evening revealed a large amount of clot burden within the bladder as well as a small area of extravasation along the bladder base.    Past Medical History:  Diagnosis Date  . Carotid arterial disease ([place]) 07/2019  . Diabetes ([place])   . HTN (hypertension)   . Hyperlipidemia   . 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 CYSTOSCOPY WITH FULGERATION N/A 09/01/2019   Procedure: CYSTOSCOPY CLOT EVACUATION WITH POSSIBLE FULGERATION;  Surgeon: Robley Fries, MD;  Location: [place];  Service: Urology;  Laterality: N/A;  . CYSTOSCOPY WITH FULGERATION N/A 09/28/2019   Procedure: [place] AND CLOT EVACUATION;  Surgeon: Robley Fries, MD;  Location: [place];  Service: Urology;  Laterality: N/A;  . TONSILLECTOMY    . TRANSCAROTID ARTERY REVASCULARIZATION Left 08/20/2019   Procedure: LEFT TRANSCAROTID ARTERY REVASCULARIZATION;  Surgeon: Waynetta Sandy, MD;  Location: [place];  Service: Vascular;  Laterality: Left;  . TRANSURETHRAL RESECTION OF BLADDER TUMOR WITH MITOMYCIN-C N/A 09/22/2019   Procedure: TRANSURETHRAL RESECTION OF BLADDER TUMOR WITH INTRAVESICAL GEMCITABINE;  Surgeon: Robley Fries, MD;  Location: [facility];  Service: Urology;  Laterality: N/A;  [facility]  Medications:  Home Meds:  Current Meds  Medication Sig  . amLODipine (NORVASC) 10 MG tablet Take 1 tablet (10 mg total) by mouth daily. (Patient taking differently: Take 10 mg by mouth in the morning. )  . apixaban (ELIQUIS) 5 MG TABS tablet Take 1 tablet (5 mg total) by mouth 2 (two) times daily.  Marland Kitchen aspirin EC 81 MG EC tablet Take 1 tablet (81 mg total) by mouth daily. Swallow whole.  Marland Kitchen atorvastatin (LIPITOR) 80 MG tablet Take 1 tablet (80 mg total) by mouth daily. (Patient taking differently: Take 80 mg by mouth at bedtime. )  . clopidogrel (PLAVIX) 75 MG tablet Take 75 mg by mouth in the morning.   Marland Kitchen losartan (COZAAR) 25 MG tablet Take 25 mg by mouth in the morning.   . metFORMIN (GLUCOPHAGE XR) 500 MG 24 hr tablet Take 1 tablet (500 mg total) by mouth daily with breakfast.  . metoprolol tartrate (LOPRESSOR) 25 MG tablet Take 25 mg by mouth See admin instructions. Take 25 mg by mouth in the morning and hold if B/P is less than 110 OR heart rate is less than 60 BPM  . MULTIPLE VITAMIN PO Take 1 tablet by mouth daily.    Scheduled Meds: . insulin aspart  0-9 Units Subcutaneous TID WC  . potassium chloride  40 mEq Oral Once   Continuous Infusions: . sodium chloride     Followed by  . sodium chloride    . ceFEPime (MAXIPIME) IV Stopped (10/05/19 2220)  . heparin    . sodium chloride irrigation    . vancomycin  PRN Meds:.  Allergies: No Known Allergies  Family History  Problem Relation Age of Onset  . Hypertension Mother   . Lung cancer Father     Social History:  reports that she quit smoking about 2 months ago. Her smoking use included cigarettes. She smoked 0.50 packs per day. She has never used smokeless tobacco. She reports previous alcohol use. She reports that she does not use drugs.  ROS: A complete review of systems was performed.  All systems are negative except for pertinent findings as noted.  Physical Exam:  Vital signs in last 24 hours: Temp:  [98.2 F  (36.8 C)] 98.2 F (36.8 C) (08/23 1451) Pulse Rate:  [88-106] 88 (08/23 2015) Resp:  [16-22] 16 (08/23 2015) BP: (104-116)/(61-74) 104/74 (08/23 2015) SpO2:  [94 %-100 %] 100 % (08/23 2015) Weight:  [80.5 kg] 80.5 kg (08/23 2300) Constitutional:  Alert and oriented, No acute distress Cardiovascular: Regular rate and rhythm, No JVD Respiratory: Normal respiratory effort, Lungs clear bilaterally GI: Abdomen is soft, nontender, nondistended, no abdominal masses GU: No CVA tenderness Lymphatic: No lymphadenopathy Neurologic: Grossly intact, no focal deficits Psychiatric: Normal mood and affect  Laboratory Data:  Recent Labs    10/03/19 0906 10/05/19 1641  WBC 6.9 18.5*  HGB 10.1* 9.4*  HCT 32.4* 31.2*  PLT 333 468*    Recent Labs    10/05/19 1641  NA 138  K 3.2*  CL 101  GLUCOSE 261*  BUN 11  CALCIUM 8.8*  CREATININE 1.22*     Results for orders placed or performed during the hospital encounter of 10/05/19 (from the past 24 hour(s))  CBG monitoring, ED     Status: Abnormal   Collection Time: 10/05/19  4:33 PM  Result Value Ref Range   Glucose-Capillary 202 (H) 70 - 99 mg/dL  Comprehensive metabolic panel     Status: Abnormal   Collection Time: 10/05/19  4:41 PM  Result Value Ref Range   Sodium 138 135 - 145 mmol/L   Potassium 3.2 (L) 3.5 - 5.1 mmol/L   Chloride 101 98 - 111 mmol/L   CO2 21 (L) 22 - 32 mmol/L   Glucose, Bld 261 (H) 70 - 99 mg/dL   BUN 11 8 - 23 mg/dL   Creatinine, Ser 1.22 (H) 0.44 - 1.00 mg/dL   Calcium 8.8 (L) 8.9 - 10.3 mg/dL   Total Protein 5.8 (L) 6.5 - 8.1 g/dL   Albumin 2.5 (L) 3.5 - 5.0 g/dL   AST 21 15 - 41 U/L   ALT 16 0 - 44 U/L   Alkaline Phosphatase 90 38 - 126 U/L   Total Bilirubin 0.6 0.3 - 1.2 mg/dL   GFR calc non Af Amer 46 (L) >60 mL/min   GFR calc Af Amer 53 (L) >60 mL/min   Anion gap 16 (H) 5 - 15  Lactic acid, plasma     Status: Abnormal   Collection Time: 10/05/19  4:41 PM  Result Value Ref Range   Lactic Acid,  Venous 5.0 (HH) 0.5 - 1.9 mmol/L  CBC with Differential     Status: Abnormal   Collection Time: 10/05/19  4:41 PM  Result Value Ref Range   WBC 18.5 (H) 4.0 - 10.5 K/uL   RBC 3.42 (L) 3.87 - 5.11 MIL/uL   Hemoglobin 9.4 (L) 12.0 - 15.0 g/dL   HCT 31.2 (L) 36 - 46 %   MCV 91.2 80.0 - 100.0 fL   MCH 27.5 26.0 - 34.0 pg  MCHC 30.1 30.0 - 36.0 g/dL   RDW 16.3 (H) 11.5 - 15.5 %   Platelets 468 (H) 150 - 400 K/uL   nRBC 0.1 0.0 - 0.2 %   Neutrophils Relative % 87 %   Neutro Abs 16.0 (H) 1.7 - 7.7 K/uL   Lymphocytes Relative 6 %   Lymphs Abs 1.2 0.7 - 4.0 K/uL   Monocytes Relative 6 %   Monocytes Absolute 1.2 (H) 0 - 1 K/uL   Eosinophils Relative 0 %   Eosinophils Absolute 0.0 0 - 0 K/uL   Basophils Relative 0 %   Basophils Absolute 0.0 0 - 0 K/uL   Immature Granulocytes 1 %   Abs Immature Granulocytes 0.13 (H) 0.00 - 0.07 K/uL  Protime-INR     Status: Abnormal   Collection Time: 10/05/19  4:41 PM  Result Value Ref Range   Prothrombin Time 21.0 (H) 11.4 - 15.2 seconds   INR 1.9 (H) 0.8 - 1.2   Recent Results (from the past 240 hour(s))  SARS CORONAVIRUS 2 (TAT 6-24 HRS) Nasopharyngeal Nasopharyngeal Swab     Status: None   Collection Time: 10/01/19  8:13 AM   Specimen: Nasopharyngeal Swab  Result Value Ref Range Status   SARS Coronavirus 2 NEGATIVE NEGATIVE Final    Comment: (NOTE) SARS-CoV-2 target nucleic acids are NOT DETECTED.  The SARS-CoV-2 RNA is generally detectable in upper and lower respiratory specimens during the acute phase of infection. Negative results do not preclude SARS-CoV-2 infection, do not rule out co-infections with other pathogens, and should not be used as the sole basis for treatment or other patient management decisions. Negative results must be combined with clinical observations, patient history, and epidemiological information. The expected result is Negative.  Fact Sheet for Patients: SugarRoll.be  Fact Sheet  for Healthcare Providers: https://www.woods-mathews.com/  This test is not yet approved or cleared by the [place] FDA and  has been authorized for detection and/or diagnosis of SARS-CoV-2 by FDA under an Emergency Use Authorization (EUA). This EUA will remain  in effect (meaning this test can be used) for the duration of the COVID-19 declaration under Se ction 564(b)(1) of the Act, 21 U.S.C. section 360bbb-3(b)(1), unless the authorization is terminated or revoked sooner.  Performed at [facility], [place] [address] [place], [place] [address]     Renal Function: Recent Labs    10/05/19 1641  CREATININE 1.22*   Estimated Creatinine Clearance: 45.9 mL/min (A) (by C-G formula based on SCr of 1.22 mg/dL (H)).  Radiologic Imaging: CT ABDOMEN PELVIS W CONTRAST  Result Date: 10/05/2019 CLINICAL DATA:  Hematuria. Bladder cancer. Bladder surgery 2 weeks ago. EXAM: CT ABDOMEN AND PELVIS WITH CONTRAST TECHNIQUE: Multidetector CT imaging of the abdomen and pelvis was performed using the standard protocol following bolus administration of intravenous contrast. CONTRAST:  12mL OMNIPAQUE IOHEXOL 300 MG/ML  SOLN COMPARISON:  09/17/2018 FINDINGS: Lower chest: Lung bases are clear. No effusions. Heart is normal size. Hepatobiliary: Area of wall calcification and thickening at the gallbladder fundus is stable. No focal hepatic abnormality. Pancreas: No focal abnormality or ductal dilatation. Spleen: No focal abnormality.  Normal size. Adrenals/Urinary Tract: Left adrenal thickening, stable. Small nonobstructing stone in the lower pole of the left kidney. No hydronephrosis. Heterogeneous material seen within the bladder, likely blood clot. There is a prominent vessel along the bladder base with possible active extravasation into the bladder lumen. Stomach/Bowel: Sigmoid diverticulosis. No active diverticulitis. Fatty proliferation within the wall of the cecum and ascending  colon, likely  related to prior inflammatory process. No evidence of bowel obstruction. Vascular/Lymphatic: Enhancing heterogeneous mass off the uterine fundus is again noted, likely large fibroid measuring up to 8.6 cm. Reproductive: Aortic atherosclerosis. Irregular noncalcified plaque in the infrarenal aorta which measures up to 2.8 cm. Other: No free fluid or free air. Musculoskeletal: No acute bony abnormality. IMPRESSION: Heterogeneous material within the bladder, likely blood clot. There is a prominent vessel near the bladder base with possible active extravasation into the bladder lumen. Sigmoid diverticulosis. Aortic atherosclerosis with irregular plaque in the infrarenal aorta. Left nephrolithiasis. Large exophytic fibroid off the uterine fundus. Electronically Signed   By: Rolm Baptise M.D.   On: 10/05/2019 19:59   DG Chest Portable 1 View  Result Date: 10/05/2019 CLINICAL DATA:  Sepsis EXAM: PORTABLE CHEST 1 VIEW COMPARISON:  08/31/2018 FINDINGS: The heart size and mediastinal contours are within normal limits. Both lungs are clear. The visualized skeletal structures are unremarkable. IMPRESSION: No active disease. Electronically Signed   By: Fidela Salisbury MD   On: 10/05/2019 21:39   I independently reviewed the above imaging studies.  Procedure:  Foley catheter placement with hand irrigation for clot removal.    Pre-Procedure diagnosis:  Gross hematuria Post-procedure diagnosis:  Same  Proceduralist: Dr. Harrell Gave Jaeline Whobrey  After verbal consent was obtained, the patients genitalia were prepped in the usual fashion.  A 24 F 3-way catheter was then inserted into the urethra and bladder with return of dark, bloody urine.  30 mL of sterile water was then used to inflate the catheter balloon.  I then extensively hand irrigated the catheter with return of ~1500 mL of clot from the bladder lumen.  No active bleeding was appreciated.  CBI was then initiated with return of clear to light pink urine.  The  patient tolerated the procedure well.    Impression/Recommendation 69 year old female with High Grade T1 UCC with recurrent episodes of hematuria with clot urinary retention, blood loss anemia and sepsis likely secondary to UTI.  Hx of BL DVT and CVA (currently on Eliquis, Aspirin and Plavix).  -Keep Foley catheter in place with CBI.  No active bleeding was appreciated during hand irrigation.  Continue to monitor H/H and transfuse PRN.  Herparin gtt per primary team.  If her hematuria worsens, an IVC filter needs to be seriously considered.   -Blood and urine cultures are pending.  Continue broad spectrum abx -NPO after midnight as a precaution in case she needs a clot evacuation at some point on 8/24.  Ellison Hughs, MD [facility] 10/06/2019, 12:00 AM

## 2019-10-05 NOTE — ED Notes (Signed)
Pt refused blood work  

## 2019-10-05 NOTE — ED Notes (Signed)
Pt is refusing to go to imaging until she receives pain medicine, provider notified.

## 2019-10-05 NOTE — Progress Notes (Addendum)
[place] for Heparin Indication: Recent DVT on Apixaban PTA holding for possible procedure   No Known Allergies  Patient Measurements: Height: 5\' 4"  (162.6 cm) Weight: 80.5 kg (177 lb 7.5 oz) IBW/kg (Calculated) : 54.7 Heparin Dosing Weight: 72 kg  Vital Signs: Temp: 98.2 F (36.8 C) (08/23 1451) BP: 104/74 (08/23 2015) Pulse Rate: 88 (08/23 2015)  Labs: Recent Labs    10/03/19 0906 10/05/19 1641  HGB 10.1* 9.4*  HCT 32.4* 31.2*  PLT 333 468*  LABPROT  --  21.0*  INR  --  1.9*  CREATININE  --  1.22*    Estimated Creatinine Clearance: 45.9 mL/min (A) (by C-G formula based on SCr of 1.22 mg/dL (H)).   Medical History: Past Medical History:  Diagnosis Date  . Carotid arterial disease ([place]) 07/2019  . Diabetes ([place])   . HTN (hypertension)   . Hyperlipidemia   . 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 [START ON 10/06/2019] insulin aspart  0-9 Units Subcutaneous TID WC    Assessment: Patient is a 32 yof that is being admitted for gross hematuria. Patient has a hx of urothelial cell carcinoma of the bladder. Pharmacy has been consulted to dose heparin as last time the patient was admitted she developed a DVT. Patient is on Apixaban PTA.  Last dose of Apixaban was at 0900 on 8/23 Goal of Therapy:  aPTT 66-102 seconds Monitor platelets by anticoagulation protocol: Yes   Plan:  - No Heparin bolus as on Apixaban PTA last dose was at 0900 on 8/23 - Start heparin drip @ 1250 units/hr now   - Will monitor heparin using aPTT until aPTT and Heparin levels correlate - aPTT level in ~ 6 hours  - Monitor patient for s/s of worsening bleeding and CBC while on heparin  - Monitor Hgb closely as pt is having gross hematuria and likely will need vena cava filter placed in AM.  Duanne Limerick PharmD. BCPS  10/05/2019,11:03 PM

## 2019-10-05 NOTE — H&P (Signed)
[facility] Admission History and Physical Service Pager: [phone]  Patient name: Lisa [name] Medical record number: [id number] Date of birth: February 15, 1950 Age: 69 y.o. Gender: female  Primary Care Provider: Patient, No Pcp Per Consultants: Urology Code Status: Full (confirmed on admission)  Chief Complaint: acute bleed  Assessment and Plan: Lisa [name] is a 69 y.o. female presenting with gross hematuria and blood loss anemia in setting of known papillary carcinoma of the bladder. PMH is significant for CVA with carotid disease s/p endovascular stent placement (7/8) with residual neurological deficit, hypertension, hyperlipidemia, diabetes, and bilateral DVTs with recent hospitalization for gross hematuria and blood loss anemia secondary to papillary carcinoma of the bladder (8/5-8/21).  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 She was discharged to SNF on 8/21 but returns today due to recurrence of acute bladder bleed. Urinating without difficulty. Hgb stable at 9.4 on admission with transfusion threshold of <8 given history of CAD. She is hemodynamically stable. CTA abd/pelvis obtained and notable for heterogeneous material seen within the bladder likely blood clot and a prominent vessel along the bladder base with possible active extravasation into the bladder lumen. Dr. Lovena Neighbours consulted - placed balloon tamponade and foley catheter with continuous bladder irrigation. Large amount of clots and hematuria obtained initially however clear by end of procedure. Per urology, likely benefit from vena cava filter.  - admit to [facility], attending Dr. Owens Shark - Urology consulted, appreciate recs, plan to see in AM - type and screen - transfusion threshold <8 -  s/p 3L NS bolus - continue mIVF 117mL/hr - NPO after midnight - vitals per protocol, continuous pulse ox, cardiac monitor - AM CBC - IV heparin drip given h/o DVT's - hold ASA, Eliquis - hold home meds while NPO  Lactic Acidosis: Lactic acid of 5 with soft blood pressures of 104/74, otherwise hemodynamically stable. Leukocytosis 18.5 with ANC of 16. Does not meet SIRS/Sepsis criteria at this time. Unclear etiology for lactic acidosis although given dry mucous membranes and skin tenting on exam, hypovolemia is highest on differential. Imaging without infectious source. Patient denies any infectious symptoms. COVID, UA, urine culture, blood culture pending. CBG on admission in 200's. Patient started on broad spectrum antibiotics and given 3L NS bolus and started on mIVF. - follow up repeat lactic acid - follow up remaining labs: UA, urine culture, COVID - continue broad spectrum antibiotics at this time  - monitor vitals per routine - s/p 3L NS bolus + 137mL/hr mIVF  Leukocytosis: As stated above, Leukocytosis of 18.5 with ANC of 16. She is hemodynamically stable and does not meet SIRS/Sepsis criteria at this time. Unclear infectious source. Patient denies any infectious symptoms.  CXR without active disease. CTA without signs of infectious source. COVID, UA, urine culture, blood culture pending. Although patient denies any difficulty with urination, UTI is highest on differential given known comorbidities. Unfortunately, UA not obtained prior to bladder irrigation thus unsure how accurate it will be.  - continue Vac/Cefepime per pharmacy - AM CBC with diff - follow up COVID, UA, urine culture, blood culture - monitor for fever or hemodynamic instability - vitals per routine, continuous pulse ox  AKI: Cr 1.22 on admission, Baseline 0.63. CTA notable for nonobstructive nephrolithiasis in the lower pole of the left kidney. Patient  appears very dry on exam although denies decreased PO intake or  other causes for hypovolemia.  - s/p 3L NS bolus - continue 163mL mIVF - follow up AM CMP  Hypokalemia: K 3.2 on admission.  - 95mEq K-dur now - repeat CMP in AM  Bilateral LE DVT: Patient noted to have bilateral LE DVT's during last admission in setting of known papillary cancer. Some concern for PE however patient declined CTA imaging. Case was discussed with urology who recommended lifelong anticoagulation. She was discharged on Eliquis. - hold Eliquis  - Given prior recommendations in chart review, will start Heparin gtt given ability to reverse if indicated - monitor Hgb and transfusion if <8  History of Left Lacunar Left MCA CVA  Carotid Atherosclerosis  HLD: History of multiple CVAs including Lacunar stroke (8/5) and left MCA infarct (6/30) with residual neurological deficits. She has known carotid disease s/p endovascular stent placement on 7/8. Carotid ultrasound during last admission notable for patency with <50% stenosis. Echo dud not show density. TEE as postponed per neurology and cardiology recommendations. Home meds: ASA, Atorvastatin 40mg  QD. Patient instructed to continue ASA but discontinue plavix.  - hold ASA while NPO - monitor Hgb and transfuse if <8 - hold Atorvastatin while NPO  HTN Soft BP's on admission, with lowest 104/78. Home meds include: Losartan 25mg  QD, Metoprolol 25mg  QD, Amlodipine 10mg  QD. Goal BP: 130-150. - hold home meds - mIVFs  T2DM: A1C 6.5%. Home meds: Metformin 500mg  QD - hold home meds - CBG's AC and QHS - sSSI   Severe Protein Calorie Malnutrition: Albumin 2.5.  - start nutrition supplement once taking PO  Uterine Mass on CT Abd/Pelvis: chronic, stable Uterine fundal mass noted on last CT abd/pelvis on 8/5 measuring 8x12x11. CT Abd/Pelvis on admission today notable for enhancing heterogenous mass off uterine fundus measuring 8.6cm consistent with a large exophytic fibroid. Per chart review, patient to have repeat outpatient TVUS.   -outpatient TVUS  FEN/GI: NPO Prophylaxis: IV heparin  Disposition: pending acute bleed  History of Present Illness:  Lisa [name] is a 69 y.o. female presenting with recurrent hematuria in setting of known high grade T1 UCC of the bladder. She has a history of CVA with carotid disease s/p endovascular stent placement (7/8) with residual neurological deficit, hypertension, hyperlipidemia, diabetes, and bilateral DVTs with recent hospitalization for gross hematuria and blood loss anemia secondary to papillary carcinoma of the bladder (8/5-8/21). She required multiple urological surgical procedures with Dr. Claudia Desanctis and at least 8 units of blood. She was discharged to SNF on 8/21. Notes she was urinating in a diaper without difficulty, denying any urinary symptoms including dysuria, frequency, urgency however she later notes that she really isnt that aware of her urinary habits. She presents to ED today due to recurrent hematuria.   In the ED, Hgb was stable at 9.4. CT revealed large amount of clot burden within the bladder as well as small area of extravasation along the bladder base.  During admission, urology was present completing balloon tamponade and irrigation removing large clot burden. By end of procedure, fluid was remaining clear to light pink.    Review Of Systems: Per HPI with the following additions:   Review of Systems  Constitutional: Negative for chills and fever.  HENT: Negative for congestion and sore throat.   Respiratory: Negative for cough, hemoptysis, sputum production and shortness of breath.   Cardiovascular: Negative for chest pain and palpitations.  Gastrointestinal: Negative for abdominal pain, blood in stool,  constipation, diarrhea, melena, nausea and vomiting.  Genitourinary: Positive for hematuria. Negative for dysuria, frequency and urgency.  Skin: Negative for rash.    Patient Active Problem List   Diagnosis Date Noted  . Acute deep vein thrombosis (DVT)  of proximal vein of both lower extremities (HCC)   . Symptomatic anemia   . Weakness generalized   . Aphasia   . Palliative care by specialist   . DNR (do not resuscitate) discussion   . Primary papillary carcinoma of bladder ([facility]) 09/17/2019  . Lacunar infarct, acute ([place]) 09/17/2019  . Acute blood loss anemia 09/17/2019  . Pressure injury of skin 09/07/2019  . Gross hematuria 09/01/2019  . Hematuria 08/31/2019  . 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 ([place]) 08/20/2019  . Stenosis of carotid artery   . Abnormality of aortic valve   . DM (diabetes mellitus), type 2, uncontrolled w/neurologic complication ([place])   . Hypercholesteremia   . CVA (cerebral vascular accident) ([place]) 08/12/2019  . Acute ischemic stroke ([place])   . Hypertension     Past Medical History: Past Medical History:  Diagnosis Date  . Carotid arterial disease ([place]) 07/2019  . Diabetes ([place])   . HTN (hypertension)   . Hyperlipidemia   . Stroke [facility])    acute/subacute left MCA infarct 08/12/19    Past Surgical History: Past Surgical History:  Procedure Laterality Date  . CYSTOSCOPY WITH FULGERATION N/A 09/01/2019   Procedure: CYSTOSCOPY CLOT EVACUATION WITH POSSIBLE FULGERATION;  Surgeon: Robley Fries, MD;  Location: [facility];  Service: Urology;  Laterality: N/A;  . CYSTOSCOPY WITH FULGERATION N/A 09/28/2019   Procedure: [place] AND CLOT EVACUATION;  Surgeon: Robley Fries, MD;  Location: [facility];  Service: Urology;  Laterality: N/A;  . TONSILLECTOMY    . TRANSCAROTID ARTERY REVASCULARIZATION Left 08/20/2019   Procedure: LEFT TRANSCAROTID ARTERY REVASCULARIZATION;  Surgeon: Waynetta Sandy, MD;  Location: [facility];  Service: Vascular;  Laterality: Left;  . TRANSURETHRAL RESECTION OF BLADDER TUMOR WITH MITOMYCIN-C N/A 09/22/2019   Procedure: TRANSURETHRAL RESECTION OF BLADDER TUMOR WITH INTRAVESICAL GEMCITABINE;  Surgeon: Robley Fries, MD;  Location: [facility];  Service: Urology;  Laterality: N/A;  10 MINS    Social History: Social History   Tobacco Use  . Smoking status: Former Smoker    Packs/day: 0.50    Types: Cigarettes    Quit date: 07/28/2019    Years since quitting: 0.1  . Smokeless tobacco: Never Used  Vaping Use  . Vaping Use: Never used  Substance Use Topics  . Alcohol use: Not Currently  . Drug use: Never   Additional social history:  Please also refer to relevant sections of EMR.  Family History: Family History  Problem Relation Age of Onset  . Hypertension Mother   . Lung cancer Father     Allergies and Medications: No Known Allergies No current facility-administered medications on file prior to encounter.   Current Outpatient Medications on File Prior to Encounter  Medication Sig Dispense Refill  . amLODipine (NORVASC) 10 MG tablet Take 1 tablet (10 mg total) by mouth daily. (Patient taking differently: Take 10 mg by mouth in the morning. ) 30 tablet 0  . apixaban (ELIQUIS) 5 MG TABS tablet Take 1 tablet (5 mg total) by mouth 2 (two) times daily. 60 tablet 0  . aspirin EC 81 MG EC tablet Take 1 tablet (81 mg total) by mouth daily. Swallow whole. 30 tablet 0  . atorvastatin (LIPITOR) 80  MG tablet Take 1 tablet (80 mg total) by mouth daily. (Patient taking differently: Take 80 mg by mouth at bedtime. ) 30 tablet 0  . clopidogrel (PLAVIX) 75 MG tablet Take 75 mg by mouth in the morning.     Marland Kitchen losartan (COZAAR) 25 MG tablet Take 25 mg by mouth in the morning.     . metFORMIN (GLUCOPHAGE XR) 500 MG 24 hr tablet Take 1 tablet (500 mg total) by mouth daily with breakfast. 30 tablet 0  . metoprolol tartrate (LOPRESSOR) 25 MG tablet Take 25 mg by mouth See admin instructions. Take 25 mg by mouth in the morning and hold if B/P is less than 110 OR heart rate is less than 60 BPM    . MULTIPLE VITAMIN PO Take 1 tablet by mouth daily.      Objective: BP 104/74   Pulse 88   Temp 98.2 F (36.8 C)   Resp 16   SpO2 100%   Exam: General: pleasant older female, lying on back in ED bed, well nourished, well developed, in no acute distress with non-toxic appearance HEENT: normocephalic, atraumatic, dry mucous membranes and lips, throat without erythema or exudate CV: regular rate and rhythm without murmurs, rubs, or gallops, no lower extremity edema Lungs: clear to auscultation bilaterally with normal work of breathing on room air Abdomen: soft, non-tender, non-distended,  Skin: warm, dry, no rashes or lesions, skin tenting appreciated  Extremities: warm and well perfused, normal tone, 2+ radial and pedal pulses bilaterally Neuro: Alert and oriented, speech normal  Labs and Imaging: CBC BMET  Recent Labs  Lab 10/05/19 1641  WBC 18.5*  HGB 9.4*  HCT 31.2*  PLT 468*   Recent Labs  Lab 10/05/19 1641  NA 138  K 3.2*  CL 101  CO2 21*  BUN 11  CREATININE 1.22*  GLUCOSE 261*  CALCIUM 8.8*     Urinalysis    Component Value Date/Time   COLORURINE COLORLESS (A) 10/06/2019 0028   APPEARANCEUR CLEAR 10/06/2019 0028   LABSPEC 1.008 10/06/2019 0028   PHURINE 5.0 10/06/2019 0028   GLUCOSEU NEGATIVE 10/06/2019 0028   HGBUR LARGE (A) 10/06/2019 0028   BILIRUBINUR NEGATIVE 10/06/2019 0028   KETONESUR NEGATIVE 10/06/2019 0028   PROTEINUR NEGATIVE 10/06/2019 0028   NITRITE NEGATIVE 10/06/2019 0028   LEUKOCYTESUR LARGE (A) 10/06/2019 0028   Lactic Acid: 5>0.9 PT/INR: 21/1.9 PTT: 46  CT ABDOMEN PELVIS W CONTRAST  Result Date: 10/05/2019 CLINICAL DATA:  Hematuria. Bladder cancer. Bladder surgery 2 weeks ago. EXAM: CT ABDOMEN AND PELVIS WITH CONTRAST TECHNIQUE: Multidetector CT imaging of the abdomen and pelvis was performed using the standard protocol following bolus administration of intravenous contrast. CONTRAST:  171mL OMNIPAQUE IOHEXOL 300 MG/ML  SOLN COMPARISON:  09/17/2018 FINDINGS: Lower chest: Lung bases are clear. No effusions. Heart is normal size. Hepatobiliary: Area of wall calcification and  thickening at the gallbladder fundus is stable. No focal hepatic abnormality. Pancreas: No focal abnormality or ductal dilatation. Spleen: No focal abnormality.  Normal size. Adrenals/Urinary Tract: Left adrenal thickening, stable. Small nonobstructing stone in the lower pole of the left kidney. No hydronephrosis. Heterogeneous material seen within the bladder, likely blood clot. There is a prominent vessel along the bladder base with possible active extravasation into the bladder lumen. Stomach/Bowel: Sigmoid diverticulosis. No active diverticulitis. Fatty proliferation within the wall of the cecum and ascending colon, likely related to prior inflammatory process. No evidence of bowel obstruction. Vascular/Lymphatic: Enhancing heterogeneous mass off the uterine fundus is again noted,  likely large fibroid measuring up to 8.6 cm. Reproductive: Aortic atherosclerosis. Irregular noncalcified plaque in the infrarenal aorta which measures up to 2.8 cm. Other: No free fluid or free air. Musculoskeletal: No acute bony abnormality. IMPRESSION: Heterogeneous material within the bladder, likely blood clot. There is a prominent vessel near the bladder base with possible active extravasation into the bladder lumen. Sigmoid diverticulosis. Aortic atherosclerosis with irregular plaque in the infrarenal aorta. Left nephrolithiasis. Large exophytic fibroid off the uterine fundus. Electronically Signed   By: Rolm Baptise M.D.   On: 10/05/2019 19:59    Danna Hefty, DO 10/05/2019, 9:30 PM PGY-3, [place] Intern pager: [phone], text pages welcome

## 2019-10-05 NOTE — Progress Notes (Signed)
Pharmacy Antibiotic Note  Lisa [name] is a 69 y.o. female admitted on 10/05/2019 with sepsis. The patient is afebrile with WBC of 18.5. Pharmacy has been consulted for cefepime and vancomycin dosing.  Plan: Cefepime IV 2 g q12h Vancomycin IV 2000 g x1 dose then 750 mg q12h Order vancomycin troughs at steady state when indicated Target vancomycin trough of 15-20 mcg/mL Monitor renal function, clinical status, cultures and length of therapy Deescalate therapy as indicated     Temp (24hrs), Avg:98.2 F (36.8 C), Min:98.2 F (36.8 C), Max:98.2 F (36.8 C)  Recent Labs  Lab 09/30/19 0149 10/01/19 0219 10/02/19 1312 10/03/19 0906 10/05/19 1641  WBC 7.8 7.0 7.4 6.9 18.5*  CREATININE  --   --   --   --  1.22*  LATICACIDVEN  --   --   --   --  5.0*    Estimated Creatinine Clearance: 45.9 mL/min (A) (by C-G formula based on SCr of 1.22 mg/dL (H)).    No Known Allergies  Antimicrobials this admission: 8/23 vancomycin >>  8/23 cefepime >>   Microbiology results: 8/23 BCx: sent 8/23 UCx: sent  Thank you for allowing pharmacy to be a part of this patient's care.  Shauna Hugh, PharmD, [place]  PGY-1 Pharmacy Resident 10/05/2019 7:21 PM  Please check [email or web address] for unit-specific pharmacy phone numbers.

## 2019-10-05 NOTE — ED Notes (Signed)
Pt. Brought back from the [facility] stating my stomach os bothering me, doesn't hurt its the stuff inside of it. Pt has blood on the diaper.  CNA stated, she had a massive bowel movement and a lot of blood form her rectum.

## 2019-10-05 NOTE — ED Notes (Signed)
Pt refused all bloodwork

## 2019-10-06 ENCOUNTER — Inpatient Hospital Stay (HOSPITAL_COMMUNITY): Payer: Medicare Other

## 2019-10-06 ENCOUNTER — Observation Stay (HOSPITAL_COMMUNITY): Payer: Medicare Other

## 2019-10-06 ENCOUNTER — Other Ambulatory Visit: Payer: Self-pay

## 2019-10-06 ENCOUNTER — Encounter (HOSPITAL_COMMUNITY): Payer: Self-pay | Admitting: Family Medicine

## 2019-10-06 ENCOUNTER — Other Ambulatory Visit (HOSPITAL_COMMUNITY): Payer: Medicare Other

## 2019-10-06 DIAGNOSIS — I82409 Acute embolism and thrombosis of unspecified deep veins of unspecified lower extremity: Secondary | ICD-10-CM | POA: Diagnosis not present

## 2019-10-06 DIAGNOSIS — Z7984 Long term (current) use of oral hypoglycemic drugs: Secondary | ICD-10-CM | POA: Diagnosis not present

## 2019-10-06 DIAGNOSIS — Z7401 Bed confinement status: Secondary | ICD-10-CM | POA: Diagnosis not present

## 2019-10-06 DIAGNOSIS — Z86718 Personal history of other venous thrombosis and embolism: Secondary | ICD-10-CM | POA: Diagnosis not present

## 2019-10-06 DIAGNOSIS — E7800 Pure hypercholesterolemia, unspecified: Secondary | ICD-10-CM | POA: Diagnosis present

## 2019-10-06 DIAGNOSIS — Z801 Family history of malignant neoplasm of trachea, bronchus and lung: Secondary | ICD-10-CM | POA: Diagnosis not present

## 2019-10-06 DIAGNOSIS — R310 Gross hematuria: Secondary | ICD-10-CM | POA: Diagnosis not present

## 2019-10-06 DIAGNOSIS — Z7901 Long term (current) use of anticoagulants: Secondary | ICD-10-CM | POA: Diagnosis not present

## 2019-10-06 DIAGNOSIS — A419 Sepsis, unspecified organism: Secondary | ICD-10-CM

## 2019-10-06 DIAGNOSIS — E43 Unspecified severe protein-calorie malnutrition: Secondary | ICD-10-CM | POA: Diagnosis present

## 2019-10-06 DIAGNOSIS — N029 Recurrent and persistent hematuria with unspecified morphologic changes: Secondary | ICD-10-CM | POA: Diagnosis present

## 2019-10-06 DIAGNOSIS — D62 Acute posthemorrhagic anemia: Secondary | ICD-10-CM | POA: Diagnosis not present

## 2019-10-06 DIAGNOSIS — Z7902 Long term (current) use of antithrombotics/antiplatelets: Secondary | ICD-10-CM | POA: Diagnosis not present

## 2019-10-06 DIAGNOSIS — D500 Iron deficiency anemia secondary to blood loss (chronic): Secondary | ICD-10-CM | POA: Diagnosis not present

## 2019-10-06 DIAGNOSIS — R531 Weakness: Secondary | ICD-10-CM | POA: Diagnosis not present

## 2019-10-06 DIAGNOSIS — E119 Type 2 diabetes mellitus without complications: Secondary | ICD-10-CM | POA: Diagnosis present

## 2019-10-06 DIAGNOSIS — E876 Hypokalemia: Secondary | ICD-10-CM | POA: Diagnosis present

## 2019-10-06 DIAGNOSIS — N390 Urinary tract infection, site not specified: Secondary | ICD-10-CM | POA: Diagnosis present

## 2019-10-06 DIAGNOSIS — Z8249 Family history of ischemic heart disease and other diseases of the circulatory system: Secondary | ICD-10-CM | POA: Diagnosis not present

## 2019-10-06 DIAGNOSIS — I82403 Acute embolism and thrombosis of unspecified deep veins of lower extremity, bilateral: Secondary | ICD-10-CM | POA: Diagnosis present

## 2019-10-06 DIAGNOSIS — R319 Hematuria, unspecified: Secondary | ICD-10-CM | POA: Diagnosis not present

## 2019-10-06 DIAGNOSIS — R269 Unspecified abnormalities of gait and mobility: Secondary | ICD-10-CM | POA: Diagnosis not present

## 2019-10-06 DIAGNOSIS — G459 Transient cerebral ischemic attack, unspecified: Secondary | ICD-10-CM | POA: Diagnosis not present

## 2019-10-06 DIAGNOSIS — R338 Other retention of urine: Secondary | ICD-10-CM | POA: Diagnosis not present

## 2019-10-06 DIAGNOSIS — I824Z3 Acute embolism and thrombosis of unspecified deep veins of distal lower extremity, bilateral: Secondary | ICD-10-CM | POA: Diagnosis not present

## 2019-10-06 DIAGNOSIS — Z20822 Contact with and (suspected) exposure to covid-19: Secondary | ICD-10-CM | POA: Diagnosis present

## 2019-10-06 DIAGNOSIS — R4701 Aphasia: Secondary | ICD-10-CM | POA: Diagnosis present

## 2019-10-06 DIAGNOSIS — Z87891 Personal history of nicotine dependence: Secondary | ICD-10-CM | POA: Diagnosis not present

## 2019-10-06 DIAGNOSIS — Z743 Need for continuous supervision: Secondary | ICD-10-CM | POA: Diagnosis not present

## 2019-10-06 DIAGNOSIS — M2550 Pain in unspecified joint: Secondary | ICD-10-CM | POA: Diagnosis not present

## 2019-10-06 DIAGNOSIS — Z79899 Other long term (current) drug therapy: Secondary | ICD-10-CM | POA: Diagnosis not present

## 2019-10-06 DIAGNOSIS — C679 Malignant neoplasm of bladder, unspecified: Secondary | ICD-10-CM | POA: Diagnosis not present

## 2019-10-06 DIAGNOSIS — C801 Malignant (primary) neoplasm, unspecified: Secondary | ICD-10-CM | POA: Diagnosis present

## 2019-10-06 DIAGNOSIS — I10 Essential (primary) hypertension: Secondary | ICD-10-CM | POA: Diagnosis present

## 2019-10-06 DIAGNOSIS — N179 Acute kidney failure, unspecified: Secondary | ICD-10-CM | POA: Diagnosis present

## 2019-10-06 DIAGNOSIS — E785 Hyperlipidemia, unspecified: Secondary | ICD-10-CM | POA: Diagnosis present

## 2019-10-06 DIAGNOSIS — Z7982 Long term (current) use of aspirin: Secondary | ICD-10-CM | POA: Diagnosis not present

## 2019-10-06 DIAGNOSIS — E872 Acidosis: Secondary | ICD-10-CM | POA: Diagnosis present

## 2019-10-06 LAB — CBC
HCT: 22.4 % — ABNORMAL LOW (ref 36.0–46.0)
Hemoglobin: 6.6 g/dL — CL (ref 12.0–15.0)
MCH: 27.8 pg (ref 26.0–34.0)
MCHC: 29.5 g/dL — ABNORMAL LOW (ref 30.0–36.0)
MCV: 94.5 fL (ref 80.0–100.0)
Platelets: 369 10*3/uL (ref 150–400)
RBC: 2.37 MIL/uL — ABNORMAL LOW (ref 3.87–5.11)
RDW: 16.7 % — ABNORMAL HIGH (ref 11.5–15.5)
WBC: 14.3 10*3/uL — ABNORMAL HIGH (ref 4.0–10.5)
nRBC: 0 % (ref 0.0–0.2)

## 2019-10-06 LAB — BASIC METABOLIC PANEL
Anion gap: 10 (ref 5–15)
BUN: 11 mg/dL (ref 8–23)
CO2: 20 mmol/L — ABNORMAL LOW (ref 22–32)
Calcium: 7.8 mg/dL — ABNORMAL LOW (ref 8.9–10.3)
Chloride: 107 mmol/L (ref 98–111)
Creatinine, Ser: 0.69 mg/dL (ref 0.44–1.00)
GFR calc Af Amer: >60 mL/min (ref >60)
GFR calc non Af Amer: >60 mL/min (ref >60)
Glucose, Bld: 116 mg/dL — ABNORMAL HIGH (ref 70–99)
Potassium: 3.3 mmol/L — ABNORMAL LOW (ref 3.5–5.1)
Sodium: 137 mmol/L (ref 135–145)

## 2019-10-06 LAB — URINALYSIS, ROUTINE W REFLEX MICROSCOPIC
Bilirubin Urine: NEGATIVE
Glucose, UA: NEGATIVE mg/dL
Ketones, ur: NEGATIVE mg/dL
Nitrite: NEGATIVE
Protein, ur: NEGATIVE mg/dL
RBC / HPF: >50 RBC/hpf — ABNORMAL HIGH (ref 0–5)
Specific Gravity, Urine: 1.008 (ref 1.005–1.030)
pH: 5 (ref 5.0–8.0)

## 2019-10-06 LAB — LACTIC ACID, PLASMA: Lactic Acid, Venous: 0.9 mmol/L (ref 0.5–1.9)

## 2019-10-06 LAB — HEMOGLOBIN AND HEMATOCRIT, BLOOD
HCT: 30.8 % — ABNORMAL LOW (ref 36.0–46.0)
Hemoglobin: 9.7 g/dL — ABNORMAL LOW (ref 12.0–15.0)

## 2019-10-06 LAB — CBG MONITORING, ED: Glucose-Capillary: 100 mg/dL — ABNORMAL HIGH (ref 70–99)

## 2019-10-06 LAB — URINE CULTURE: Culture: <10000 — AB

## 2019-10-06 LAB — SARS CORONAVIRUS 2 BY RT PCR (HOSPITAL ORDER, PERFORMED IN ~~LOC~~ HOSPITAL LAB): SARS Coronavirus 2: NEGATIVE

## 2019-10-06 LAB — APTT
aPTT: 46 seconds — ABNORMAL HIGH (ref 24–36)
aPTT: >200 seconds (ref 24–36)
aPTT: 198 seconds (ref 24–36)
aPTT: 104 seconds — ABNORMAL HIGH (ref 24–36)

## 2019-10-06 LAB — PROTIME-INR
INR: 1.8 — ABNORMAL HIGH (ref 0.8–1.2)
Prothrombin Time: 20.2 seconds — ABNORMAL HIGH (ref 11.4–15.2)

## 2019-10-06 LAB — PREPARE RBC (CROSSMATCH)

## 2019-10-06 LAB — HEPARIN LEVEL (UNFRACTIONATED): Heparin Unfractionated: >2.2 IU/mL — ABNORMAL HIGH (ref 0.30–0.70)

## 2019-10-06 IMAGING — US US RENAL
1 series · 14 of 25 positions shown · non-contrast
Comparison: Abdominopelvic CT yesterday.

CLINICAL DATA: Hematuria.  Patient has history of bladder tumor.

EXAM:
RENAL / URINARY TRACT ULTRASOUND COMPLETE

[Series 1: us renal · 14 of 27 slices shown]
[im 1/27]
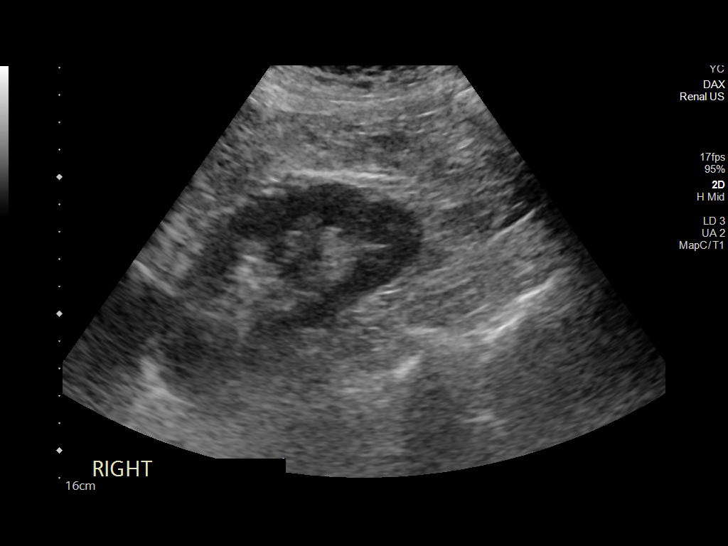
[im 3/27]
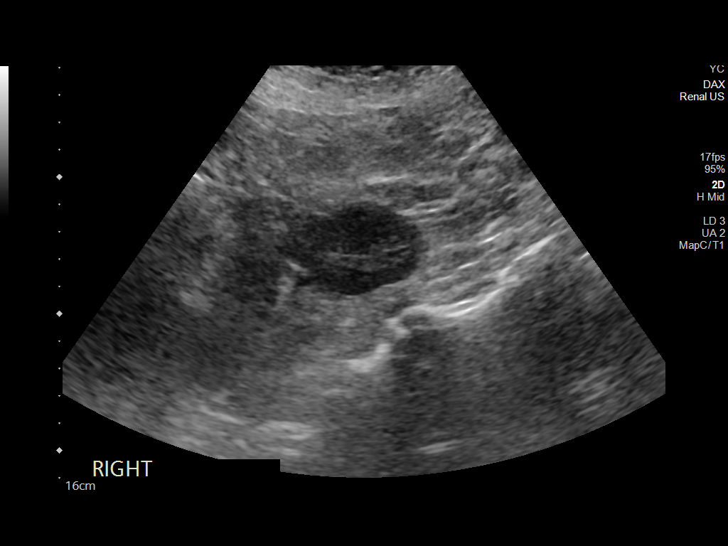
[im 5/27]
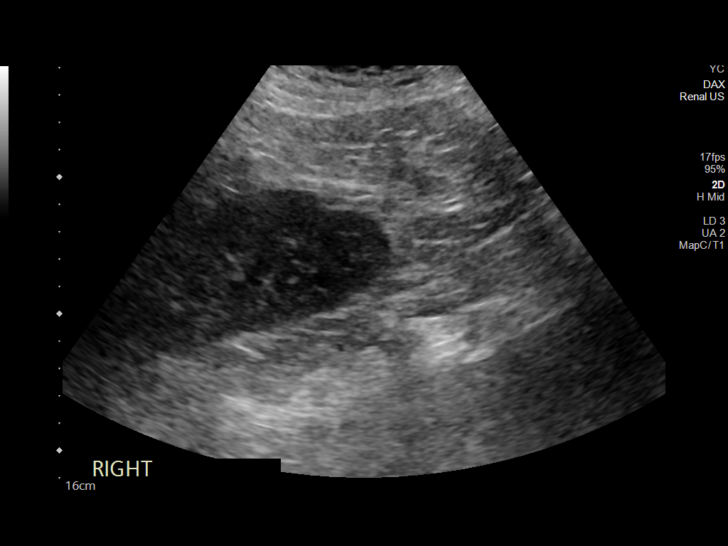
[im 7/27]
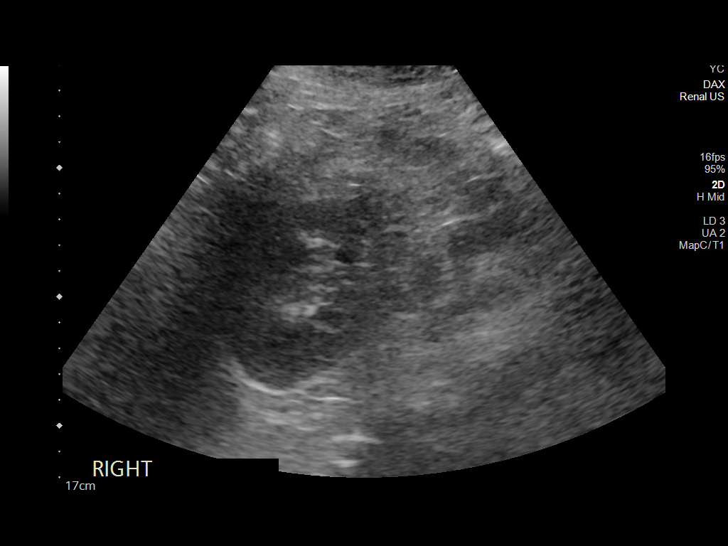
[im 9/27]
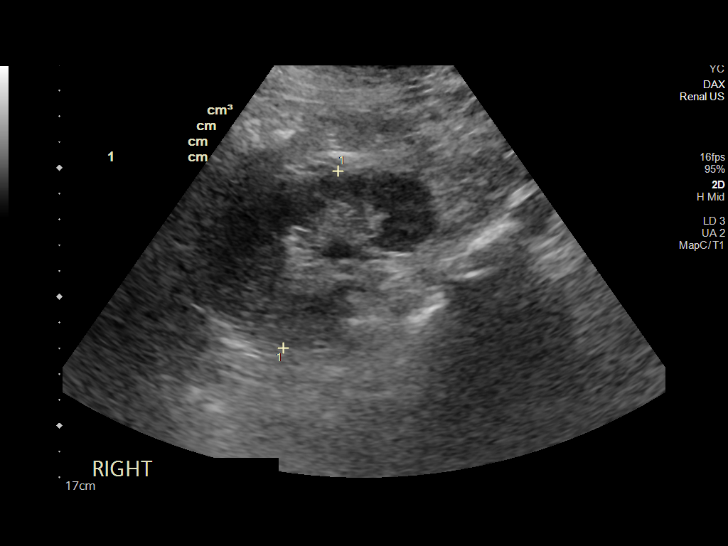
[im 10/27]
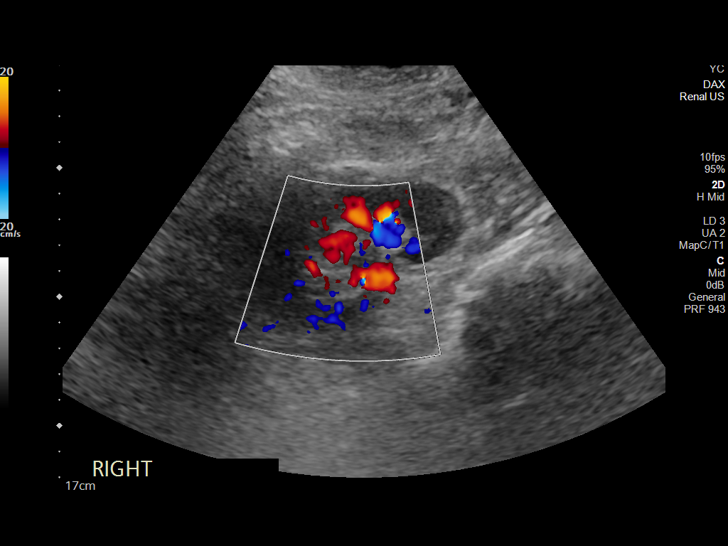
[im 12/27]
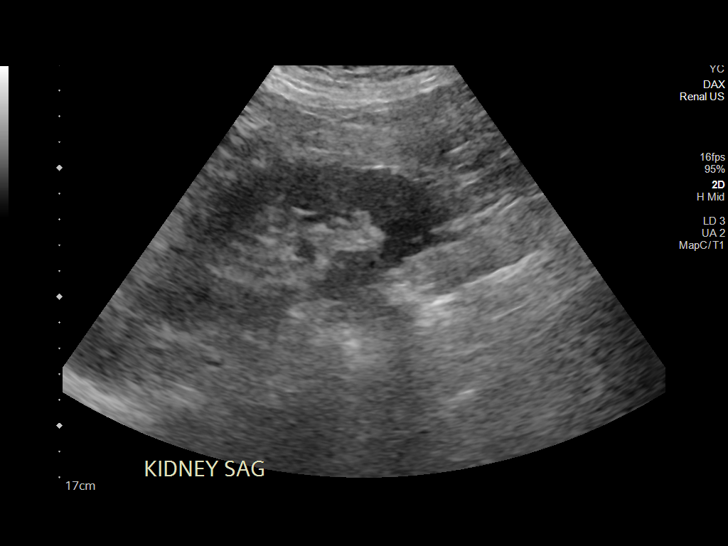
[im 15/27]
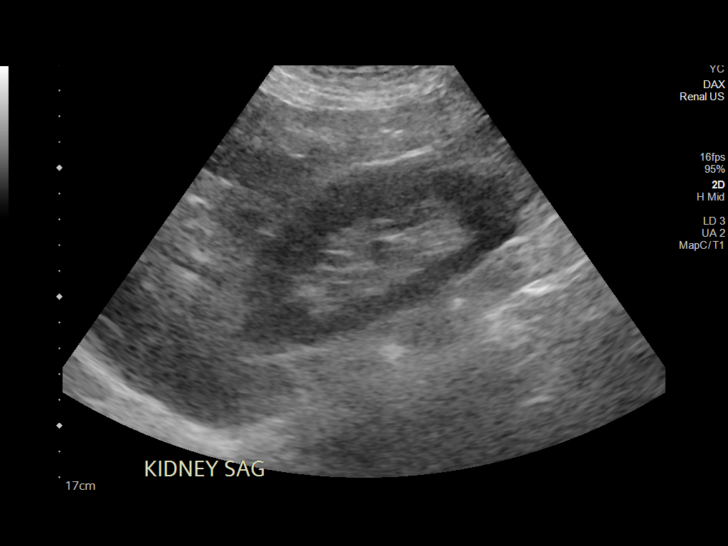
[im 17/27]
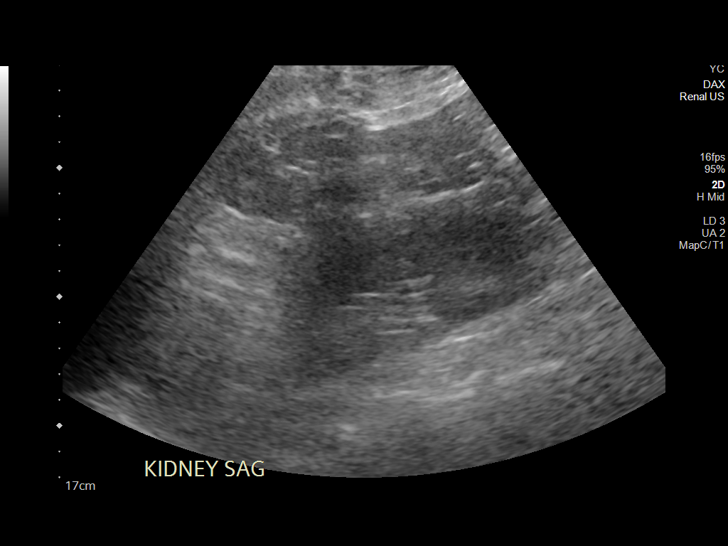
[im 18/27]
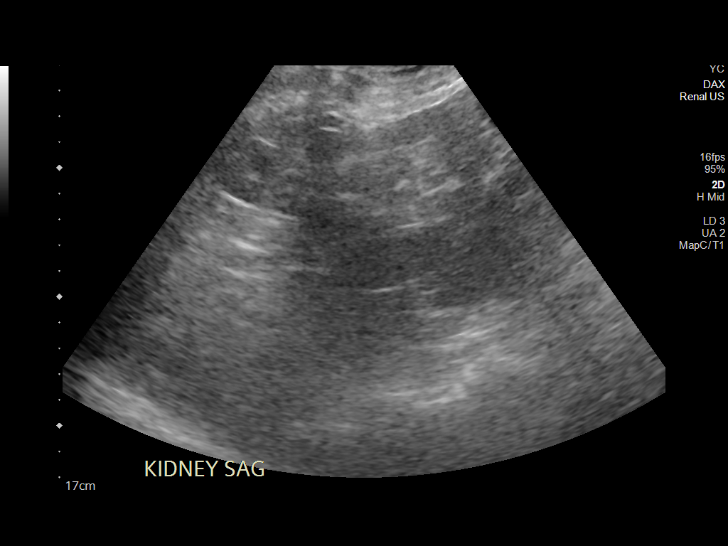
[im 20/27]
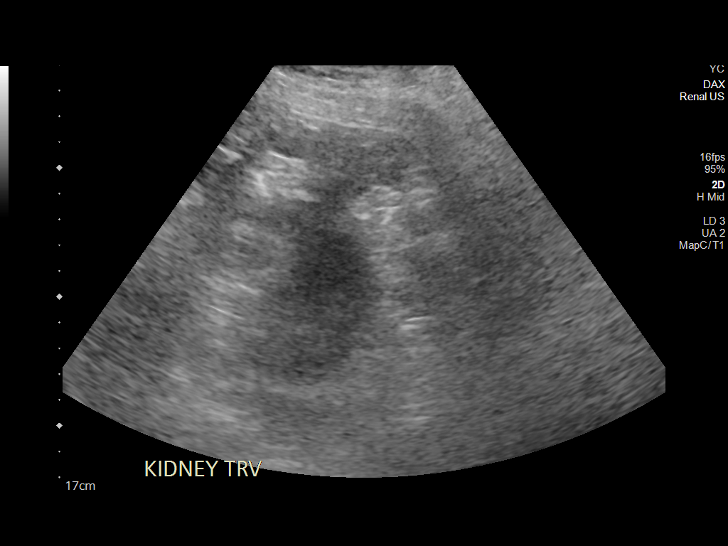
[im 22/27]
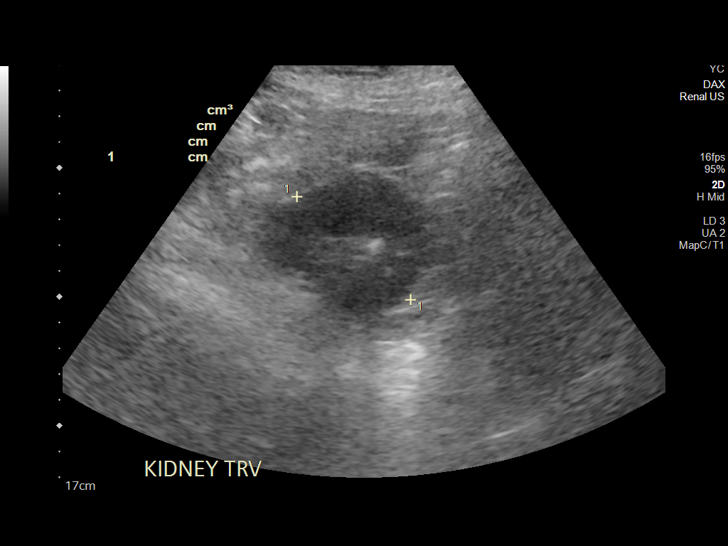
[im 24/27]
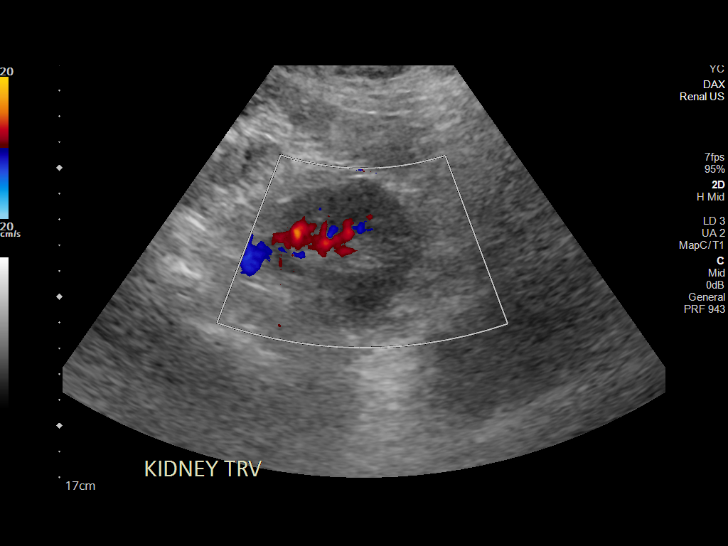
[im 27/27]
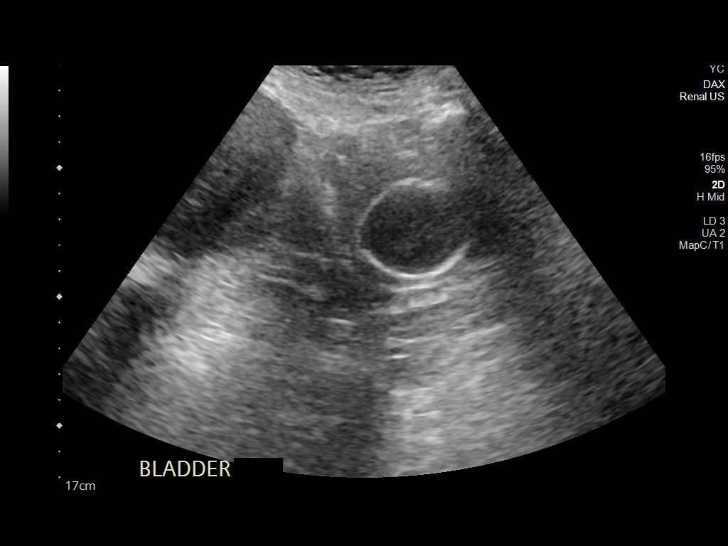

[14 of 25 positions shown; findings below may reference images not displayed]

FINDINGS: Right Kidney:

Renal measurements: 11.1 x 5.4 x 7.2 cm = volume: 223 mL.
Echogenicity within normal limits. No mass or hydronephrosis
visualized.

Left Kidney:

Renal measurements: 11.6 x 5.7 x 5.0 cm = volume: 204 mL. Stone in
the left kidney on CT is not well seen by ultrasound. Echogenicity
within normal limits. No mass or hydronephrosis visualized.

Bladder:

Decompressed by Foley catheter not well evaluated.

Other:

None.
IMPRESSION: 1. Left renal stone on CT yesterday is not well seen by ultrasound.
2. No obstructive uropathy.
3. Urinary bladder is decompressed by Foley catheter not well
assessed.

## 2019-10-06 MED ORDER — HEPARIN (PORCINE) 25000 UT/250ML-% IV SOLN
650.0000 [IU]/h | INTRAVENOUS | Status: DC
  Administered 2019-10-06: 1000 [IU]/h via INTRAVENOUS
  Filled 2019-10-06: qty 250

## 2019-10-06 MED ORDER — ATORVASTATIN CALCIUM 80 MG PO TABS
80.0000 mg | ORAL_TABLET | Freq: Every day | ORAL | Status: DC
  Administered 2019-10-06 – 2019-10-13 (×8): 80 mg via ORAL
  Filled 2019-10-06 (×8): qty 1

## 2019-10-06 MED ORDER — SODIUM CHLORIDE 0.9% IV SOLUTION: Freq: Once | INTRAVENOUS | Status: DC

## 2019-10-06 MED ORDER — CHLORHEXIDINE GLUCONATE CLOTH 2 % EX PADS
6.0000 | MEDICATED_PAD | Freq: Every day | CUTANEOUS | Status: DC
  Administered 2019-10-06 – 2019-10-13 (×8): 6 via TOPICAL

## 2019-10-06 MED ORDER — POTASSIUM CHLORIDE CRYS ER 20 MEQ PO TBCR
40.0000 meq | EXTENDED_RELEASE_TABLET | Freq: Once | ORAL | Status: AC
  Administered 2019-10-06: 40 meq via ORAL
  Filled 2019-10-06: qty 2

## 2019-10-06 NOTE — Consult Note (Signed)
Chief Complaint: Patient was seen in consultation today for retrievable inferior vena cava filter placement Chief Complaint  Patient presents with  . Post-op Problem   at the request of Dr Clementeen Graham   Supervising Physician: Sandi Mariscal  Patient Status: [facility] [place] - ED  History of Present Illness: Lisa [name] is a 69 y.o. 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 Carotid arterial disease ([place]) 07/2019  . Diabetes ([place])   . HTN (hypertension)   . Hyperlipidemia   . Stroke [facility])    acute/subacute left MCA infarct 08/12/19    Past Surgical History:  Procedure Laterality Date  . CYSTOSCOPY WITH FULGERATION N/A 09/01/2019   Procedure: CYSTOSCOPY CLOT EVACUATION WITH POSSIBLE FULGERATION;  Surgeon: Robley Fries, MD;  Location: [place];  Service: Urology;  Laterality: N/A;  . CYSTOSCOPY WITH FULGERATION N/A 09/28/2019   Procedure: Divide AND CLOT EVACUATION;  Surgeon: Robley Fries, MD;  Location: [place];  Service: Urology;  Laterality: N/A;  . TONSILLECTOMY    . TRANSCAROTID ARTERY REVASCULARIZATION Left 08/20/2019   Procedure: LEFT TRANSCAROTID ARTERY REVASCULARIZATION;  Surgeon: Waynetta Sandy, MD;  Location: [place];  Service: Vascular;  Laterality: Left;  . TRANSURETHRAL RESECTION OF BLADDER TUMOR WITH MITOMYCIN-C N/A 09/22/2019   Procedure: TRANSURETHRAL RESECTION OF BLADDER TUMOR WITH INTRAVESICAL GEMCITABINE;  Surgeon: Robley Fries, MD;  Location: [facility];  Service: Urology;  Laterality: N/A;   90 MINS    Allergies: Patient has no known allergies.  Medications: Prior to Admission medications   Medication Sig Start Date End Date Taking? Authorizing Provider  amLODipine (NORVASC) 10 MG tablet Take 1 tablet (10 mg total) by mouth daily. Patient taking differently: Take 10 mg by mouth in the morning.  09/08/19  Yes Beard, Ralph Leyden, DO  apixaban (ELIQUIS) 5 MG TABS tablet Take 1 tablet (5 mg total) by mouth 2 (two) times daily. 10/03/19  Yes Daisy Floro, DO  aspirin EC 81 MG EC tablet Take 1 tablet (81 mg total) by mouth daily. Swallow whole. 09/08/19  Yes Beard, Samantha N, DO  atorvastatin (LIPITOR) 80 MG tablet Take 1 tablet (80 mg total) by mouth daily. Patient taking differently: Take 80 mg by mouth at bedtime.  08/15/19  Yes Autry-Lott, Naaman Plummer, DO  clopidogrel (PLAVIX) 75 MG tablet Take 75 mg by mouth in the morning.    Yes [provider]  losartan (COZAAR) 25 MG tablet Take 25 mg by mouth in the morning.    Yes [provider]  metFORMIN (GLUCOPHAGE XR) 500 MG 24 hr tablet Take 1 tablet (500 mg total) by mouth daily with breakfast. 08/14/19  Yes Autry-Lott, Naaman Plummer, DO  metoprolol tartrate (LOPRESSOR) 25 MG tablet Take 25 mg by mouth See admin instructions. Take 25 mg by mouth in the morning and hold if B/P is less than 110 OR heart rate is less than 60 BPM   Yes [provider]  MULTIPLE VITAMIN PO Take 1 tablet by mouth daily.   Yes [provider]     Family  History  Problem Relation Age of Onset  . Hypertension Mother   . Lung cancer Father     Social History   Socioeconomic History  . Marital status: Married    Spouse name: Not on file  . Number of children: Not on file  . Years of education: Not on file  . Highest education level: Not on file  Occupational History  . Not on file  Tobacco Use  . Smoking status: Former Smoker    Packs/day: 0.50    Types: Cigarettes    Quit date: 07/28/2019    Years since quitting: 0.1    . Smokeless tobacco: Never Used  Vaping Use  . Vaping Use: Never used  Substance and Sexual Activity  . Alcohol use: Not Currently  . Drug use: Never  . Sexual activity: Yes  Other Topics Concern  . Not on file  Social History Narrative  . Not on file   Social Determinants of Health   Financial Resource Strain:   . Difficulty of Paying Living Expenses: Not on file  Food Insecurity:   . Worried About Charity fundraiser in the Last Year: Not on file  . Ran Out of Food in the Last Year: Not on file  Transportation Needs:   . Lack of Transportation (Medical): Not on file  . Lack of Transportation (Non-Medical): Not on file  Physical Activity:   . Days of Exercise per Week: Not on file  . Minutes of Exercise per Session: Not on file  Stress:   . Feeling of Stress : Not on file  Social Connections:   . Frequency of Communication with Friends and Family: Not on file  . Frequency of Social Gatherings with Friends and Family: Not on file  . Attends Religious Services: Not on file  . Active Member of Clubs or Organizations: Not on file  . Attends Archivist Meetings: Not on file  . Marital Status: Not on file    Review of Systems: A 12 point ROS discussed and pertinent positives are indicated in the HPI above.  All other systems are negative.   Vital Signs: BP (!) 131/58   Pulse 75   Temp 98.4 F (36.9 C) (Oral)   Resp 18   Ht 5\' 4"  (1.626 m)   Wt 177 lb 7.5 oz (80.5 kg)   SpO2 99%   BMI 30.46 kg/m   Physical Exam Vitals reviewed.  Cardiovascular:     Rate and Rhythm: Normal rate and regular rhythm.     Heart sounds: Normal heart sounds.  Pulmonary:     Breath sounds: Normal breath sounds.  Abdominal:     Palpations: Abdomen is soft.  Musculoskeletal:        General: Normal range of motion.  Skin:    General: Skin is warm.  Neurological:     Mental Status: She is alert. Mental status is at baseline.  Psychiatric:     Comments: Able to carry  conversation and seem appropriate But cannot tell me name and dob or location So spoke to Husband Russell and [employer] (SIL) via phone--- they consent to procedure     Imaging: CT Head Wo Contrast  Result Date: 09/17/2019 CLINICAL DATA:  Mental status change, unknown cause; history of cancer. EXAM: CT HEAD WITHOUT CONTRAST TECHNIQUE: Contiguous axial images were obtained from the base of the skull through the vertex without intravenous contrast. COMPARISON:  Brain MRI 08/12/2019, head CT 08/12/2019 FINDINGS: Brain: Redemonstrated large, now late  subacute to chronic, left MCA vascular territory infarct affecting portions of the frontal, parietal and temporal lobes as well as left insula. There is uniform cortical hyperdensity throughout the infarction territory likely reflecting cortical laminar necrosis. New from prior examinations, there is a lacunar infarct within the left lentiform nucleus which may be acute or subacute (series 3, image 20). Stable, mild generalized parenchymal atrophy. Stable background mild ill-defined hypoattenuation within the cerebral white matter which is nonspecific, but consistent with chronic small vessel ischemic disease. No extra-axial fluid collection. No evidence of intracranial mass. No midline shift. Vascular: No definite hyperdense vessel. Atherosclerotic calcifications. Partially visualized and within the cervical left ICA. Skull: Normal. Negative for fracture or focal lesion. Sinuses/Orbits: Visualized orbits show no acute finding. No significant paranasal sinus disease or mastoid effusion at the imaged levels. IMPRESSION: A lacunar infarct within the left lentiform nucleus is new as compared to the MRI of 08/12/2019, and may be acute or subacute. Redemonstrated large, now late subacute to chronic, left MCA vascular territory infarct. Uniform cortical hyperdensity throughout the infarction territory likely reflects cortical laminar necrosis. Stable background mild  generalized parenchymal atrophy and chronic small vessel ischemic disease. Electronically Signed   By: Kellie Simmering DO   On: 09/17/2019 13:57   MR ANGIO HEAD WO CONTRAST  Result Date: 09/17/2019 CLINICAL DATA:  Provided history: Brain mass or lesion. EXAM: MRI HEAD WITHOUT AND WITH CONTRAST MRA HEAD WITHOUT CONTRAST TECHNIQUE: Multiplanar, multiecho pulse sequences of the brain and surrounding structures were obtained without and with intravenous contrast. Angiographic images of the head were obtained using MRA technique without contrast. CONTRAST:  17mL GADAVIST GADOBUTROL 1 MMOL/ML IV SOLN COMPARISON:  Head CT 09/17/2019, brain MRI 08/12/2019, CT angiogram head/neck 08/12/2019. FINDINGS: MRI HEAD FINDINGS Brain: The axial postcontrast T1 weighted imaging is mild to moderately motion degraded. Stable, mild generalized parenchymal atrophy. There is a 10 mm focus of restricted diffusion within the posteromedial left thalamus consistent with acute/early subacute infarction (series 9, image 74). Additionally, there is a subcentimeter focus of diffusion weighted hyperintensity without definite T2 shine through within the subcortical posterior left frontal lobe which which may reflect a small subacute infarct (series 9, image 83). Redemonstrated large late subacute/chronic left MCA vascular territory infarct affecting portions of the left frontal, parietal and temporal lobes as well as left insula and left caudate nucleus. There is cortical T1 hyperintensity and SWI signal loss consistent with chronic hemosiderin deposition and/or cortical laminar necrosis at this site. Additionally, there is probable subtle cortical enhancement at this site which is not unexpected for a late subacute infarct. Stable background mild scattered T2/FLAIR hyperintensity within the cerebral white matter and pons which is nonspecific, but consistent with chronic small vessel ischemic disease. A lacunar infarct within the left lentiform  nucleus is new as compared to the prior MRI of 08/12/2019, but not acute. No evidence of intracranial mass. No extra-axial fluid collection. No midline shift. Vascular: Reported below. Skull and upper cervical spine: No focal marrow lesion. Sinuses/Orbits: Visualized orbits show no acute finding. Mild ethmoid sinus mucosal thickening. No significant mastoid effusion. MRA HEAD FINDINGS The intracranial internal carotid arteries are patent with no more than mild atherosclerotic narrowing. The M1 middle cerebral arteries are patent. Mild/moderate stenosis at the origin of the M1 left MCA (series 1064, image 8). No left M2 proximal branch occlusion is identified. There are sites of moderate stenosis within the proximal superior and inferior division left M2 MCA branches. Additionally, there is a high-grade focal stenosis  within a mid M2 inferior division left MCA branch (series 1070, image 4). Atherosclerotic irregularity of the M2 and more distal right MCA branches. Most notably, there is a severe stenosis within a proximal to mid superior division right M2 MCA branch (series 1070, image 12). The anterior cerebral arteries are patent without appreciable high-grade proximal stenosis. The intracranial vertebral arteries are patent without significant stenosis, as is the basilar artery. The posterior cerebral arteries are patent proximally. Redemonstrated moderate to moderately severe stenosis within the right PCA at the P2/P3 junction (series 1076, image 16). Sites of moderate stenosis within the P3 and more distal left PCA. A left posterior communicating artery is present. The right posterior communicating artery is hypoplastic or absent No intracranial aneurysm is identified. IMPRESSION: MRI brain: 1. Mild-to-moderate motion degradation of the axial T1 weighted postcontrast imaging. 2. 10 mm acute/early subacute infarct within the posteromedial left thalamus. 3. Subcentimeter suspected late subacute infarct within the  subcortical posterior left frontal lobe. 4. A lacunar infarct within the left lentiform nucleus is new as compared to the prior MRI of 08/12/2019, but not acute. 5. Redemonstrated large chronic left MCA territory infarct with associated chronic hemosiderin deposition and/or cortical laminar necrosis. Mild cortical enhancement is also present at this site, which is not unexpected for a late subacute infarct. MRA head: 1. No intracranial large vessel occlusion. 2. Intracranial atherosclerotic disease with multifocal stenoses, most notably as follows. 3. Mild/moderate stenosis at the origin of the M1 left middle cerebral artery. 4. Sites of moderate stenosis within the proximal superior and inferior division left M2 MCA branches. 5. High-grade focal stenosis within a mid M2 inferior division left MCA branch vessel. 6. Severe stenosis within a proximal to mid superior division right M2 MCA branch vessel. 7. Moderate to moderately severe stenosis within the right PCA at the P2/P3 junction. 8. Sites of up to moderate stenosis within the P3 and more distal left PCA. Electronically Signed   By: Kellie Simmering DO   On: 09/17/2019 18:47   MR BRAIN W WO CONTRAST  Result Date: 09/17/2019 CLINICAL DATA:  Provided history: Brain mass or lesion. EXAM: MRI HEAD WITHOUT AND WITH CONTRAST MRA HEAD WITHOUT CONTRAST TECHNIQUE: Multiplanar, multiecho pulse sequences of the brain and surrounding structures were obtained without and with intravenous contrast. Angiographic images of the head were obtained using MRA technique without contrast. CONTRAST:  46mL GADAVIST GADOBUTROL 1 MMOL/ML IV SOLN COMPARISON:  Head CT 09/17/2019, brain MRI 08/12/2019, CT angiogram head/neck 08/12/2019. FINDINGS: MRI HEAD FINDINGS Brain: The axial postcontrast T1 weighted imaging is mild to moderately motion degraded. Stable, mild generalized parenchymal atrophy. There is a 10 mm focus of restricted diffusion within the posteromedial left thalamus consistent  with acute/early subacute infarction (series 9, image 74). Additionally, there is a subcentimeter focus of diffusion weighted hyperintensity without definite T2 shine through within the subcortical posterior left frontal lobe which which may reflect a small subacute infarct (series 9, image 83). Redemonstrated large late subacute/chronic left MCA vascular territory infarct affecting portions of the left frontal, parietal and temporal lobes as well as left insula and left caudate nucleus. There is cortical T1 hyperintensity and SWI signal loss consistent with chronic hemosiderin deposition and/or cortical laminar necrosis at this site. Additionally, there is probable subtle cortical enhancement at this site which is not unexpected for a late subacute infarct. Stable background mild scattered T2/FLAIR hyperintensity within the cerebral white matter and pons which is nonspecific, but consistent with chronic small vessel ischemic disease. A  lacunar infarct within the left lentiform nucleus is new as compared to the prior MRI of 08/12/2019, but not acute. No evidence of intracranial mass. No extra-axial fluid collection. No midline shift. Vascular: Reported below. Skull and upper cervical spine: No focal marrow lesion. Sinuses/Orbits: Visualized orbits show no acute finding. Mild ethmoid sinus mucosal thickening. No significant mastoid effusion. MRA HEAD FINDINGS The intracranial internal carotid arteries are patent with no more than mild atherosclerotic narrowing. The M1 middle cerebral arteries are patent. Mild/moderate stenosis at the origin of the M1 left MCA (series 1064, image 8). No left M2 proximal branch occlusion is identified. There are sites of moderate stenosis within the proximal superior and inferior division left M2 MCA branches. Additionally, there is a high-grade focal stenosis within a mid M2 inferior division left MCA branch (series 1070, image 4). Atherosclerotic irregularity of the M2 and more  distal right MCA branches. Most notably, there is a severe stenosis within a proximal to mid superior division right M2 MCA branch (series 1070, image 12). The anterior cerebral arteries are patent without appreciable high-grade proximal stenosis. The intracranial vertebral arteries are patent without significant stenosis, as is the basilar artery. The posterior cerebral arteries are patent proximally. Redemonstrated moderate to moderately severe stenosis within the right PCA at the P2/P3 junction (series 1076, image 16). Sites of moderate stenosis within the P3 and more distal left PCA. A left posterior communicating artery is present. The right posterior communicating artery is hypoplastic or absent No intracranial aneurysm is identified. IMPRESSION: MRI brain: 1. Mild-to-moderate motion degradation of the axial T1 weighted postcontrast imaging. 2. 10 mm acute/early subacute infarct within the posteromedial left thalamus. 3. Subcentimeter suspected late subacute infarct within the subcortical posterior left frontal lobe. 4. A lacunar infarct within the left lentiform nucleus is new as compared to the prior MRI of 08/12/2019, but not acute. 5. Redemonstrated large chronic left MCA territory infarct with associated chronic hemosiderin deposition and/or cortical laminar necrosis. Mild cortical enhancement is also present at this site, which is not unexpected for a late subacute infarct. MRA head: 1. No intracranial large vessel occlusion. 2. Intracranial atherosclerotic disease with multifocal stenoses, most notably as follows. 3. Mild/moderate stenosis at the origin of the M1 left middle cerebral artery. 4. Sites of moderate stenosis within the proximal superior and inferior division left M2 MCA branches. 5. High-grade focal stenosis within a mid M2 inferior division left MCA branch vessel. 6. Severe stenosis within a proximal to mid superior division right M2 MCA branch vessel. 7. Moderate to moderately severe  stenosis within the right PCA at the P2/P3 junction. 8. Sites of up to moderate stenosis within the P3 and more distal left PCA. Electronically Signed   By: Kellie Simmering DO   On: 09/17/2019 18:47   US PELVIS LIMITED (TRANSABDOMINAL ONLY)  Result Date: 09/20/2019 CLINICAL DATA:  Hematuria EXAM: LIMITED ULTRASOUND OF PELVIS TECHNIQUE: Limited transabdominal ultrasound examination of the pelvis was performed. COMPARISON:  None. FINDINGS: There is complex heterogeneous material seen within the urinary bladder, likely blood clot. Foley catheter is in place. No bladder wall thickening visualized. IMPRESSION: Large amount of heterogeneous material within the urinary bladder, presumably blood clot. Electronically Signed   By: Rolm Baptise M.D.   On: 09/20/2019 15:50   CT ABDOMEN PELVIS W CONTRAST  Result Date: 10/05/2019 CLINICAL DATA:  Hematuria. Bladder cancer. Bladder surgery 2 weeks ago. EXAM: CT ABDOMEN AND PELVIS WITH CONTRAST TECHNIQUE: Multidetector CT imaging of the abdomen and pelvis was performed  using the standard protocol following bolus administration of intravenous contrast. CONTRAST:  180mL OMNIPAQUE IOHEXOL 300 MG/ML  SOLN COMPARISON:  09/17/2018 FINDINGS: Lower chest: Lung bases are clear. No effusions. Heart is normal size. Hepatobiliary: Area of wall calcification and thickening at the gallbladder fundus is stable. No focal hepatic abnormality. Pancreas: No focal abnormality or ductal dilatation. Spleen: No focal abnormality.  Normal size. Adrenals/Urinary Tract: Left adrenal thickening, stable. Small nonobstructing stone in the lower pole of the left kidney. No hydronephrosis. Heterogeneous material seen within the bladder, likely blood clot. There is a prominent vessel along the bladder base with possible active extravasation into the bladder lumen. Stomach/Bowel: Sigmoid diverticulosis. No active diverticulitis. Fatty proliferation within the wall of the cecum and ascending colon, likely  related to prior inflammatory process. No evidence of bowel obstruction. Vascular/Lymphatic: Enhancing heterogeneous mass off the uterine fundus is again noted, likely large fibroid measuring up to 8.6 cm. Reproductive: Aortic atherosclerosis. Irregular noncalcified plaque in the infrarenal aorta which measures up to 2.8 cm. Other: No free fluid or free air. Musculoskeletal: No acute bony abnormality. IMPRESSION: Heterogeneous material within the bladder, likely blood clot. There is a prominent vessel near the bladder base with possible active extravasation into the bladder lumen. Sigmoid diverticulosis. Aortic atherosclerosis with irregular plaque in the infrarenal aorta. Left nephrolithiasis. Large exophytic fibroid off the uterine fundus. Electronically Signed   By: Rolm Baptise M.D.   On: 10/05/2019 19:59   CT ABDOMEN PELVIS W CONTRAST  Result Date: 09/17/2019 CLINICAL DATA:  Left lower quadrant pain, hematuria EXAM: CT ABDOMEN AND PELVIS WITH CONTRAST TECHNIQUE: Multidetector CT imaging of the abdomen and pelvis was performed using the standard protocol following bolus administration of intravenous contrast. CONTRAST:  25mL OMNIPAQUE IOHEXOL 300 MG/ML  SOLN COMPARISON:  None. FINDINGS: Lower chest: No acute abnormality. Hepatobiliary: No focal lesion. Calcified gallstone. Dilatation of the common bile duct measuring up to 1.1 cm. There is tapering distally. Pancreas: Unremarkable. Spleen: Unremarkable. Adrenals/Urinary Tract: Mild diffuse thickening of the left adrenal. No hydronephrosis. 3 mm nonobstructing calculus of the lower pole of the left kidney. There is heterogeneous density within the bladder lumen. Mild wall thickening identified. Stomach/Bowel: Stomach is within normal limits. Bowel is normal in caliber. Sigmoid diverticulosis. Normal appendix. Vascular/Lymphatic: Aortic atherosclerosis with calcified and irregular noncalcified plaque. The infrarenal aorta measures up to 2.9 cm. More proximal  aorta measures 1.9 cm. No enlarged lymph nodes identified. Reproductive: No adnexal mass. There is a heterogeneous exophytic mass of the uterine fundus measuring approximately 8 x 12 x 11 cm. Other: No ascites. No significant abnormality of the abdominal wall. Musculoskeletal: Degenerative changes of the included spine. No acute osseous abnormality. IMPRESSION: Heterogeneous density within the bladder lumen likely reflecting blood products. Mild wall thickening is present but no discrete mass is identified. Large exophytic mass of the uterine fundus measuring up to 12 cm. Small nonobstructing left renal calculus. Cholelithiasis. Mild dilatation of the proximal common bile duct with distal tapering. Sigmoid diverticulosis. Electronically Signed   By: Macy Mis M.D.   On: 09/17/2019 13:52   DG Chest Portable 1 View  Result Date: 10/05/2019 CLINICAL DATA:  Sepsis EXAM: PORTABLE CHEST 1 VIEW COMPARISON:  08/31/2018 FINDINGS: The heart size and mediastinal contours are within normal limits. Both lungs are clear. The visualized skeletal structures are unremarkable. IMPRESSION: No active disease. Electronically Signed   By: Fidela Salisbury MD   On: 10/05/2019 21:39   ECHOCARDIOGRAM COMPLETE  Result Date: 09/19/2019    ECHOCARDIOGRAM  REPORT   Patient Name:   Lisa [name] Date of Exam: 09/19/2019 Medical Rec #:  [id number]         Height:       64.0 in Accession #:    [id number]        Weight:       177.5 lb Date of Birth:  03-19-1950         BSA:          1.859 m Patient Age:    4 years          BP:           143/90 mmHg Patient Gender: F                 HR:           118 bpm. Exam Location:  Inpatient Procedure: 2D Echo, Cardiac Doppler and Color Doppler Indications:    Stroke 434.91 / I163.9  History:        Patient has prior history of Echocardiogram examinations, most                 recent 08/13/2019. Carotid Disease; Risk Factors:Hypertension,                 Diabetes and Dyslipidemia.  Sonographer:     Jonelle Sidle Dance Referring Phys: [id number] [address]  1. Left ventricular ejection fraction, by estimation, is 60 to 65%. The left ventricle has normal function. The left ventricle has no regional wall motion abnormalities. Left ventricular diastolic parameters are consistent with Grade I diastolic dysfunction (impaired relaxation).  2. Right ventricular systolic function is moderately reduced. The right ventricular size is normal. There is normal pulmonary artery systolic pressure.  3. The mitral valve is normal in structure. No evidence of mitral valve regurgitation.  4. The aortic valve is normal in structure. Aortic valve regurgitation is not visualized. No aortic stenosis is present. FINDINGS  Left Ventricle: Left ventricular ejection fraction, by estimation, is 60 to 65%. The left ventricle has normal function. The left ventricle has no regional wall motion abnormalities. The left ventricular internal cavity size was normal in size. There is  no left ventricular hypertrophy. Left ventricular diastolic parameters are consistent with Grade I diastolic dysfunction (impaired relaxation). Right Ventricle: The right ventricular size is normal. No increase in right ventricular wall thickness. Right ventricular systolic function is moderately reduced. There is normal pulmonary artery systolic pressure. The tricuspid regurgitant velocity is 2.78 m/s, and with an assumed right atrial pressure of 3 mmHg, the estimated right ventricular systolic pressure is 13.2 mmHg. Left Atrium: Left atrial size was normal in size. Right Atrium: Right atrial size was normal in size. Pericardium: There is no evidence of pericardial effusion. Mitral Valve: The mitral valve is normal in structure. No evidence of mitral valve regurgitation. Tricuspid Valve: The tricuspid valve is grossly normal. Tricuspid valve regurgitation is trivial. Aortic Valve: The aortic valve is normal in structure. Aortic valve regurgitation is not  visualized. No aortic stenosis is present. Pulmonic Valve: The pulmonic valve was grossly normal. Pulmonic valve regurgitation is not visualized. Aorta: The aortic root and ascending aorta are structurally normal, with no evidence of dilitation. IAS/Shunts: The atrial septum is grossly normal.  LEFT VENTRICLE PLAX 2D LVIDd:         3.60 cm  Diastology LVIDs:         2.60 cm  LV e' lateral: 7.65 cm/s LV PW:  0.90 cm  LV e' medial:  6.34 cm/s LV IVS:        1.00 cm LVOT diam:     1.80 cm LV SV:         41 LV SV Index:   22 LVOT Area:     2.54 cm  RIGHT VENTRICLE             IVC RV Basal diam:  2.10 cm     IVC diam: 1.70 cm RV S prime:     15.10 cm/s TAPSE (M-mode): 1.5 cm LEFT ATRIUM           Index       RIGHT ATRIUM          Index LA diam:      2.60 cm 1.40 cm/m  RA Area:     8.49 cm LA Vol (A2C): 37.6 ml 20.22 ml/m RA Volume:   14.00 ml 7.53 ml/m LA Vol (A4C): 12.8 ml 6.86 ml/m  AORTIC VALVE LVOT Vmax:   112.50 cm/s LVOT Vmean:  78.400 cm/s LVOT VTI:    0.162 m  AORTA Ao Root diam: 3.40 cm Ao Asc diam:  3.50 cm MV A velocity: 117.50 cm/s  TRICUSPID VALVE                             TR Peak grad:   30.9 mmHg                             TR Vmax:        278.00 cm/s                              SHUNTS                             Systemic VTI:  0.16 m                             Systemic Diam: 1.80 cm Mertie Moores MD Electronically signed by Mertie Moores MD Signature Date/Time: 09/19/2019/1:33:55 PM    Final    VAS US CAROTID  Result Date: 09/20/2019 Carotid Arterial Duplex Study Indications:       CVA. Risk Factors:      Hypertension, hyperlipidemia, Diabetes, past history of                    smoking, prior CVA. Other Factors:     Status post left TCAR 08/20/19. Limitations        Today's exam was limited due to confusion, constant movement                    of head. Comparison Study:  Prior left carotid duplex done 08/13/19, showing critical                    left ICA stenosis, is available for  comparison. Performing Technologist: Sharion Dove RVS  Examination Guidelines: A complete evaluation includes B-mode imaging, spectral Doppler, color Doppler, and power Doppler as needed of all accessible portions of each vessel. Bilateral testing is considered an integral part of a complete examination. Limited examinations for reoccurring indications may be performed as noted.  Right Carotid Findings: +----------+--------+--------+--------+------------------+------------------+  PSV cm/sEDV cm/sStenosisPlaque DescriptionComments           +----------+--------+--------+--------+------------------+------------------+ CCA Prox  67      15                                intimal thickening +----------+--------+--------+--------+------------------+------------------+ CCA Distal64      23                                intimal thickening +----------+--------+--------+--------+------------------+------------------+ ICA Prox  102     34      1-39%   calcific                             +----------+--------+--------+--------+------------------+------------------+ ICA Distal67      28                                                   +----------+--------+--------+--------+------------------+------------------+ ECA       150     26                                                   +----------+--------+--------+--------+------------------+------------------+ +----------+--------+-------+--------+-------------------+           PSV cm/sEDV cmsDescribeArm Pressure (mmHG) +----------+--------+-------+--------+-------------------+ [id number]                                         +----------+--------+-------+--------+-------------------+ +---------+--------+--------+------------+ VertebralPSV cm/sEDV cm/sNot assessed +---------+--------+--------+------------+  Left Carotid Findings: +----------+--------+--------+--------+------------------+------------------+            PSV cm/sEDV cm/sStenosisPlaque DescriptionComments           +----------+--------+--------+--------+------------------+------------------+ CCA Prox  51      12                                intimal thickening +----------+--------+--------+--------+------------------+------------------+ CCA Distal47      14                                intimal thickening +----------+--------+--------+--------+------------------+------------------+ ICA Distal71      13                                                   +----------+--------+--------+--------+------------------+------------------+ +----------+--------+--------+------------+-------------------+           PSV cm/sEDV cm/sDescribe    Arm Pressure (mmHG) +----------+--------+--------+------------+-------------------+ Subclavian                Not assessed                    +----------+--------+--------+------------+-------------------+ +---------+--------+--+--------+--+ VertebralPSV cm/s50EDV cm/s17 +---------+--------+--+--------+--+  Left Stent(s): +---------------+---+--+-------------+++ Prox to Stent  51 12<50% stenosis +---------------+---+--+-------------+++ Proximal Stent 36 10<50% stenosis +---------------+---+--+-------------+++ Mid Stent  10931<50% stenosis +---------------+---+--+-------------+++ Distal Stent   10327<50% stenosis +---------------+---+--+-------------+++ Distal to Stent71 13<50% stenosis +---------------+---+--+-------------+++    Summary: Right Carotid: Velocities in the right ICA are consistent with a 1-39% stenosis. Left Carotid: Patent stent with <50% stenosis. Vertebrals:  Left vertebral artery demonstrates antegrade flow. Right vertebral              artery was not visualized. Subclavians: Left subclavian artery was not visualized. Normal flow hemodynamics              were seen in the right subclavian artery. *See table(s) above for  measurements and observations.  Electronically signed by Harold Barban MD on 09/20/2019 at 4:54:43 PM.    Final    VAS [place] LOWER EXTREMITY VENOUS (DVT)  Result Date: 09/20/2019  Lower Venous DVTStudy Indications: SOB, and Tachycardia.  Risk Factors: Cancer Bladder. Limitations: Foley. Comparison Study: No prior study on file Performing Technologist: Sharion Dove RVS  Examination Guidelines: A complete evaluation includes B-mode imaging, spectral Doppler, color Doppler, and power Doppler as needed of all accessible portions of each vessel. Bilateral testing is considered an integral part of a complete examination. Limited examinations for reoccurring indications may be performed as noted. The reflux portion of the exam is performed with the patient in reverse Trendelenburg.  +---------+---------------+---------+-----------+----------+--------------+ RIGHT    CompressibilityPhasicitySpontaneityPropertiesThrombus Aging +---------+---------------+---------+-----------+----------+--------------+ CFV      Full           Yes      Yes                                 +---------+---------------+---------+-----------+----------+--------------+ SFJ      Full                                                        +---------+---------------+---------+-----------+----------+--------------+ FV Prox  Partial        No       No                   Acute          +---------+---------------+---------+-----------+----------+--------------+ FV Mid   None                                         Acute          +---------+---------------+---------+-----------+----------+--------------+ FV DistalNone                                         Acute          +---------+---------------+---------+-----------+----------+--------------+ PFV      Full                                                        +---------+---------------+---------+-----------+----------+--------------+ POP      None  Acute          +---------+---------------+---------+-----------+----------+--------------+ PTV      None                                         Acute          +---------+---------------+---------+-----------+----------+--------------+ PERO     None                                         Acute          +---------+---------------+---------+-----------+----------+--------------+   +---------+---------------+---------+-----------+----------+-------------------+ LEFT     CompressibilityPhasicitySpontaneityPropertiesThrombus Aging      +---------+---------------+---------+-----------+----------+-------------------+ CFV      Full           Yes      Yes                                      +---------+---------------+---------+-----------+----------+-------------------+ SFJ      Full                                                             +---------+---------------+---------+-----------+----------+-------------------+ FV Prox  Full                                                             +---------+---------------+---------+-----------+----------+-------------------+ FV Mid                  Yes      Yes                  patent by color and                                                       Doppler             +---------+---------------+---------+-----------+----------+-------------------+ FV Distal               Yes      Yes                  patent by color and                                                       Doppler             +---------+---------------+---------+-----------+----------+-------------------+ PFV      Full                                                             +---------+---------------+---------+-----------+----------+-------------------+  POP      Partial        No       No                   Acute                +---------+---------------+---------+-----------+----------+-------------------+ PTV      Partial                                      Acute               +---------+---------------+---------+-----------+----------+-------------------+ PERO     Partial                                      Acute               +---------+---------------+---------+-----------+----------+-------------------+     Summary: RIGHT: - Findings consistent with acute deep vein thrombosis involving the right femoral vein, right popliteal vein, right posterior tibial veins, and right peroneal veins.  LEFT: - Findings consistent with acute deep vein thrombosis involving the left popliteal vein, left posterior tibial veins, and left peroneal veins.  *See table(s) above for measurements and observations. Electronically signed by Harold Barban MD on 09/20/2019 at 4:56:52 PM.    Final     Labs:  CBC: Recent Labs    10/02/19 1312 10/03/19 0906 10/05/19 1641 10/06/19 0740  WBC 7.4 6.9 18.5* 14.3*  HGB 9.7* 10.1* 9.4* 6.6*  HCT 30.9* 32.4* 31.2* 22.4*  PLT 299 333 468* 369    COAGS: Recent Labs    08/19/19 1522 08/31/19 0855 10/05/19 1641 10/06/19 0048 10/06/19 0740 10/06/19 0914  INR 1.0 1.1 1.9*  --  1.8*  --   APTT 33  --   --  46* >200* 198*    BMP: Recent Labs    09/22/19 0323 09/22/19 0323 09/23/19 0513 09/28/19 1632 10/05/19 1641 10/06/19 0740  NA 136   < > 136 139 138 137  K 3.6   < > 3.7 3.7 3.2* 3.3*  CL 100   < > 101 98 101 107  CO2 21*  --  23  --  21* 20*  GLUCOSE 103*   < > 111* 141* 261* 116*  BUN 8   < > 8 8 11 11   CALCIUM 8.4*  --  8.4*  --  8.8* 7.8*  CREATININE 0.63   < > 0.63 0.50 1.22* 0.69  GFRNONAA >60  --  >60  --  46* >60  GFRAA >60  --  >60  --  53* >60   < > = values in this interval not displayed.    LIVER FUNCTION TESTS: Recent Labs    09/20/19 0228 09/21/19 0128 09/22/19 0323 10/05/19 1641  BILITOT 0.9 1.0 0.8 0.6  AST 17 16 17 21   ALT 13 14 13 16     ALKPHOS 93 87 79 90  PROT 4.9* 5.1* 5.2* 5.8*  ALBUMIN 2.1* 2.3* 2.3* 2.5*    TUMOR MARKERS: No results for input(s): AFPTM, CEA, CA199, CHROMGRNA in the last 8760 hours.  Assessment and Plan:  Scheduled for retrievable IVC filter in IR  plan for 8/25 See orders  Risks and benefits discussed with the patient's family via phone including, but not limited to bleeding, infection, contrast induced  renal failure, filter fracture or migration which can lead to emergency surgery or even death, strut penetration with damage or irritation to adjacent structures and caval thrombosis.  All  questions were answered, patient is agreeable to proceed. Consent signed and in chart.   Thank you for this interesting consult.  I greatly enjoyed meeting Concha Sudol [id number] and look forward to participating in their care.  A copy of this report was sent to the requesting provider on this date.  Electronically Signed: Lavonia Drafts, PA-C 10/06/2019, 1:38 PM   I spent a total of 20 Minutes    in face to face in clinical consultation, greater than 50% of which was counseling/coordinating care for retrievable IVC filter placement

## 2019-10-06 NOTE — Progress Notes (Addendum)
Lisa [name] for Heparin Indication: Recent DVT on Apixaban PTA holding for possible procedure   No Known Allergies  Patient Measurements: Height: 5\' 4"  (162.6 cm) Weight: 80.5 kg (177 lb 7.5 oz) IBW/kg (Calculated) : 54.7 Heparin Dosing Weight: 72 kg  Vital Signs: Temp: 97.8 F (36.6 C) (08/24 2111) Temp Source: Oral (08/24 2111) BP: 151/69 (08/24 2111) Pulse Rate: 82 (08/24 2111)  Labs: Recent Labs    10/05/19 1641 10/06/19 0048 10/06/19 0740 10/06/19 0748 10/06/19 0914 10/06/19 2118  HGB 9.4*  --  6.6*  --   --  9.7*  HCT 31.2*  --  22.4*  --   --  30.8*  PLT 468*  --  369  --   --   --   APTT  --    < > >200*  --  198* 104*  LABPROT 21.0*  --  20.2*  --   --   --   INR 1.9*  --  1.8*  --   --   --   HEPARINUNFRC  --   --   --  >2.20*  --   --   CREATININE 1.22*  --  0.69  --   --   --    < > = values in this interval not displayed.    Estimated Creatinine Clearance: 70 mL/min (by C-G formula based on SCr of 0.69 mg/dL).   Medical History: Past Medical History:  Diagnosis Date  . Carotid arterial disease ([place]) 07/2019  . Diabetes ([place])   . HTN (hypertension)   . Hyperlipidemia   . 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 sodium chloride   Intravenous Once  . atorvastatin  80 mg Oral QHS  . Chlorhexidine Gluconate Cloth  6 each Topical Daily  . insulin aspart  0-9 Units Subcutaneous TID WC    Assessment: 69 yr old female admitted for gross hematuria. Patient has a hx of urothelial cell carcinoma of the bladder, stroke, and bilateral DVT. Pharmacy was consulted to dose heparin for the treatment of bilateral DVTs and possible PE. Patient was on apixaban PTA (last dose of  was at 0900 on 8/23). Due to recent apixaban exposure, plan to monitor anticoagulation with aPTT until aPTT and heparin levels correlate. Patient's urine was clear this morning but Hgb is downtrending to  6.6, requiring transfusion; repeat Hbg this evening is up to 9.7. Will target lower aPTT goal, due to bleeding risk.   aPTT ~6 hrs after restarting heparin at 100 units/hr was 104 sec, which is above the goal range for this pt. H/H 9.7/30.8, platelets 369. Per RN, no issues with IV; pt is still having pink urine through bladder catheter.  Goal of Therapy:  aPTT 66-84 seconds HL 0.3-0.5 Monitor platelets by anticoagulation protocol: Yes   Plan:  Reduce heparin infusion to 850 units/hr Check aPTT, heparin level in 6 hrs Monitor daily heparin level, aPTT,  CBC Monitor for signs/symptoms of bleeding  Gillermina Hu, PharmD, BCPS, [facility] Clinical Pharmacist 10/06/2019 10:13 PM

## 2019-10-06 NOTE — Progress Notes (Signed)
Urology Inpatient Progress Report  Hematuria [R31.9]        Intv/Subj: 69 y.o. 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  She presents from her care facility with a 48 hour history of worsening gross hematuria.  She states that she has been voiding without difficulty into a diaper for the past several days and denies urinary urgency, frequency, dysuria or suprapubic pain.  CT from this evening revealed a large amount of clot burden within the bladder as well as a small area of extravasation along the bladder base.    Urology interventions: 09/01/19: Cysto, clot evacuation, discovered to have bladder tumor intraop, TURBT 09/22/19: Cysto, clot evacuation, re-staging TURBT 09/28/19: Cysto, clot evacuation, fulguration (no active bladder seen, no residual tumor burden) 10/06/19: Bedside clot irrigation and CBI started  Overnight: Patient had 24Fr 3 way foley placed and 1.5L clot irrigated and CBI started.  Patient urine is clear on slow CBI drip this AM.  Patient without complaint.  Dr. Nancy Fetter and pharmacist at the bedside when patient seen this AM.  Hgb dropped to 6.6 this AM.   Active Problems:   Hematuria  Current Facility-Administered Medications  Medication Dose Route Frequency Provider Last Rate Last Admin  . 0.9 %  sodium chloride infusion (Manually program via Guardrails IV Fluids)   Intravenous Once Milus Banister C, DO      . 0.9 %  sodium chloride infusion  1,000 mL Intravenous Continuous Mullis, Kiersten P, DO 125 mL/hr at 10/06/19 0150 1,000 mL at 10/06/19 0150  . atorvastatin (LIPITOR) tablet 80 mg  80 mg Oral QHS Mullis, Kiersten P, DO      . ceFEPIme (MAXIPIME) 2 g in sodium chloride 0.9 % 100 mL IVPB  2 g Intravenous Q12H Mullis, Kiersten P, DO 200 mL/hr at 10/06/19 0852 2 g at 10/06/19 0852  . insulin aspart (novoLOG) injection 0-9 Units  0-9 Units  Subcutaneous TID WC Mullis, Kiersten P, DO      . potassium chloride SA (KLOR-CON) CR tablet 40 mEq  40 mEq Oral Once Mullis, Kiersten P, DO      . sodium chloride irrigation 0.9 % 3,000 mL  3,000 mL Irrigation Continuous Mullis, Kiersten P, DO   3,000 mL at 10/05/19 2200   Current Outpatient Medications  Medication Sig Dispense Refill  . amLODipine (NORVASC) 10 MG tablet Take 1 tablet (10 mg total) by mouth daily. (Patient taking differently: Take 10 mg by mouth in the morning. ) 30 tablet 0  . apixaban (ELIQUIS) 5 MG TABS tablet Take 1 tablet (5 mg total) by mouth 2 (two) times daily. 60 tablet 0  . aspirin EC 81 MG EC tablet Take 1 tablet (81 mg total) by mouth daily. Swallow whole. 30 tablet 0  . atorvastatin (LIPITOR) 80 MG tablet Take 1 tablet (80 mg total) by mouth daily. (Patient taking differently: Take 80 mg by mouth at bedtime. ) 30 tablet 0  . clopidogrel (PLAVIX) 75 MG tablet Take 75 mg by mouth in the morning.     Marland Kitchen losartan (COZAAR) 25 MG tablet Take 25 mg by mouth in the morning.     . metFORMIN (GLUCOPHAGE XR) 500 MG 24 hr tablet Take 1 tablet (500 mg total) by mouth daily with breakfast. 30 tablet 0  . metoprolol tartrate (LOPRESSOR) 25 MG tablet Take 25 mg by mouth See admin instructions. Take 25 mg by mouth  in the morning and hold if B/P is less than 110 OR heart rate is less than 60 BPM    . MULTIPLE VITAMIN PO Take 1 tablet by mouth daily.       Objective: Vital: Vitals:   10/06/19 0730 10/06/19 0800 10/06/19 0830 10/06/19 0900  BP: (!) 121/53 (!) 117/52 (!) 108/58 126/60  Pulse: 80 74 75 76  Resp: (!) 23 20 18    Temp:      SpO2: 99% 99% 100% 100%  Weight:      Height:       I/Os: No intake/output data recorded.  Physical Exam:  General: Patient is in no apparent distress Lungs: Normal respiratory effort, chest expands symmetrically. GI: abdomen is soft  Foley: 24Fr 3 way foley with CBI slow drip, urine clear in tubing without clot Ext: lower extremities  symmetric  Lab Results: Recent Labs    10/05/19 1641 10/06/19 0740  WBC 18.5* 14.3*  HGB 9.4* 6.6*  HCT 31.2* 22.4*   Recent Labs    10/05/19 1641 10/06/19 0740  NA 138 137  K 3.2* 3.3*  CL 101 107  CO2 21* 20*  GLUCOSE 261* 116*  BUN 11 11  CREATININE 1.22* 0.69  CALCIUM 8.8* 7.8*   Recent Labs    10/05/19 1641 10/06/19 0740  INR 1.9* 1.8*   No results for input(s): LABURIN in the last 72 hours. Results for orders placed or performed during the hospital encounter of 10/05/19  Culture, blood (Routine x 2)     Status: None (Preliminary result)   Collection Time: 10/05/19  4:42 PM   Specimen: BLOOD  Result Value Ref Range Status   Specimen Description BLOOD LEFT ANTECUBITAL  Final   Special Requests   Final    BOTTLES DRAWN AEROBIC AND ANAEROBIC Blood Culture adequate volume   Culture   Final    NO GROWTH < 24 HOURS Performed at [facility], [place] [address] [place], [place] [address]    Report Status PENDING  Incomplete  Culture, blood (Routine x 2)     Status: None (Preliminary result)   Collection Time: 10/05/19  5:55 PM   Specimen: BLOOD  Result Value Ref Range Status   Specimen Description BLOOD LEFT ARM  Final   Special Requests   Final    BOTTLES DRAWN AEROBIC AND ANAEROBIC Blood Culture adequate volume   Culture   Final    NO GROWTH < 24 HOURS Performed at [facility], [place] [address], [place], [place] [address]    Report Status PENDING  Incomplete  SARS Coronavirus 2 by RT PCR (hospital order, performed in [facility] hospital lab) Nasopharyngeal Nasopharyngeal Swab     Status: None   Collection Time: 10/06/19 12:15 AM   Specimen: Nasopharyngeal Swab  Result Value Ref Range Status   SARS Coronavirus 2 NEGATIVE NEGATIVE Final    Comment: (NOTE) SARS-CoV-2 target nucleic acids are NOT DETECTED.  The SARS-CoV-2 RNA is generally detectable in upper and lower respiratory specimens during the acute phase of infection. The  lowest concentration of SARS-CoV-2 viral copies this assay can detect is 250 copies / mL. A negative result does not preclude SARS-CoV-2 infection and should not be used as the sole basis for treatment or other patient management decisions.  A negative result may occur with improper specimen collection / handling, submission of specimen other than nasopharyngeal swab, presence of viral mutation(s) within the areas targeted by this assay, and inadequate number of viral  copies (<250 copies / mL). A negative result must be combined with clinical observations, patient history, and epidemiological information.  Fact Sheet for Patients:   StrictlyIdeas.no  Fact Sheet for Healthcare Providers: BankingDealers.co.za  This test is not yet approved or  cleared by the [place] FDA and has been authorized for detection and/or diagnosis of SARS-CoV-2 by FDA under an Emergency Use Authorization (EUA).  This EUA will remain in effect (meaning this test can be used) for the duration of the COVID-19 declaration under Section 564(b)(1) of the Act, 21 U.S.C. section 360bbb-3(b)(1), unless the authorization is terminated or revoked sooner.  Performed at [facility], [place] [address], [place], [place] [address]     Studies/Results: CT ABDOMEN PELVIS W CONTRAST  Result Date: 10/05/2019 CLINICAL DATA:  Hematuria. Bladder cancer. Bladder surgery 2 weeks ago. EXAM: CT ABDOMEN AND PELVIS WITH CONTRAST TECHNIQUE: Multidetector CT imaging of the abdomen and pelvis was performed using the standard protocol following bolus administration of intravenous contrast. CONTRAST:  147mL OMNIPAQUE IOHEXOL 300 MG/ML  SOLN COMPARISON:  09/17/2018 FINDINGS: Lower chest: Lung bases are clear. No effusions. Heart is normal size. Hepatobiliary: Area of wall calcification and thickening at the gallbladder fundus is stable. No focal hepatic abnormality. Pancreas: No focal  abnormality or ductal dilatation. Spleen: No focal abnormality.  Normal size. Adrenals/Urinary Tract: Left adrenal thickening, stable. Small nonobstructing stone in the lower pole of the left kidney. No hydronephrosis. Heterogeneous material seen within the bladder, likely blood clot. There is a prominent vessel along the bladder base with possible active extravasation into the bladder lumen. Stomach/Bowel: Sigmoid diverticulosis. No active diverticulitis. Fatty proliferation within the wall of the cecum and ascending colon, likely related to prior inflammatory process. No evidence of bowel obstruction. Vascular/Lymphatic: Enhancing heterogeneous mass off the uterine fundus is again noted, likely large fibroid measuring up to 8.6 cm. Reproductive: Aortic atherosclerosis. Irregular noncalcified plaque in the infrarenal aorta which measures up to 2.8 cm. Other: No free fluid or free air. Musculoskeletal: No acute bony abnormality. IMPRESSION: Heterogeneous material within the bladder, likely blood clot. There is a prominent vessel near the bladder base with possible active extravasation into the bladder lumen. Sigmoid diverticulosis. Aortic atherosclerosis with irregular plaque in the infrarenal aorta. Left nephrolithiasis. Large exophytic fibroid off the uterine fundus. Electronically Signed   By: Rolm Baptise M.D.   On: 10/05/2019 19:59   DG Chest Portable 1 View  Result Date: 10/05/2019 CLINICAL DATA:  Sepsis EXAM: PORTABLE CHEST 1 VIEW COMPARISON:  08/31/2018 FINDINGS: The heart size and mediastinal contours are within normal limits. Both lungs are clear. The visualized skeletal structures are unremarkable. IMPRESSION: No active disease. Electronically Signed   By: Fidela Salisbury MD   On: 10/05/2019 21:39    Assessment: 69 yo woman with known HG T1 UCC bladder, hx of CVA and acute DVTs on anticoagulation with multiple episodes of gross hematuria with need for clot evacuation.  Patient re-presented to ED  with worsening gross hematuria and clot retention.  Bedside bladder irrigation and CBI started.  Urology interventions: 09/01/19: Cysto, clot evacuation, discovered to have bladder tumor intraop, TURBT 09/22/19: Cysto, clot evacuation, re-staging TURBT 09/28/19: Cysto, clot evacuation, fulguration (no active bladder seen, no residual tumor burden) 10/06/19: Bedside clot irrigation and CBI started  Plan: -please obtain repeat renal US to assess for residual bladder clot -suspect patient is not emptying bladder well as result of CVA and bladder cancer; recommend continuing foley for 7-10 days to allow bladder to  heal -Keep Foley catheter in place with CBI.  No active bleeding was appreciated during hand irrigation.  Continue to monitor H/H and transfuse PRN.  Herparin gtt per primary team.  If her hematuria worsens, an IVC filter needs to be seriously considered.   -Blood and urine cultures are pending.  Continue broad spectrum abx -no OR intervention today   Jacalyn Lefevre, MD Urology 10/06/2019, 9:07 AM

## 2019-10-06 NOTE — Progress Notes (Signed)
Family Medicine Teaching Service Daily Progress Note Intern Pager: [phone]  Patient name: Lisa [name] Medical record number: [id number] Date of birth: [birth year] Age: 69 y.o. Gender: female  Primary Care Provider: Patient, No Pcp Per Consultants: Urology, IR   Code Status: Full Code  Pt Overview and Major Events to Date:  8/23 Admitted 8/24 2u pRBC  Assessment and Plan: Lisa [name] is a 69 y.o. female who presents with gross hematuria and blood loss anemia in setting of known papillary carcinoma of the bladder. PMHx significant for: T1 UCC of the bladder, bilateral DVTs, CVA, HTN, HLD, T2DM.  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  CT abdomen revealed a large amount of clot burden within the bladder as well as a small area of extravasation on the bladder base.  Urology was consulted, Dr. Lovena Neighbours performed bedside clot irrigation and initiated CBI 8/24.  Suspect that recurrence of hematuria is secondary to bladder distention in the setting of recent CVA, causing scab to come off resulting in bleed.  Patient has also been on anticoagulation for DVT and presumed PE treatment, likely contributing to her bleeding.  She has required multiple urological interventions to control her bleeding.   Urine clear on slow CBI drip this morning.  Hemoglobin this morning below transfusion threshold at this time, will transfuse appropriately. We will explore IVC filter placement as an alternative to anticoagulation. IR has been consulted, plan for retrievable IVC filter placement 8/25. We will continue to hold anticoagulation and antiplatelet agents, except will restart low-dose heparin drip for DVT treatment and monitor. aPTT this morning likely inaccurate as it was drawn from the heparin line. Per urology, will  plan to continue Foley catheter for 7 to 10 days to allow bladder to heal and to wean CBI as tolerated.  We will also obtain repeat renal ultrasound to assess for residual bladder clot.  Appreciate involvement of urology and interventional radiology. - transfuse 2u pRBC, f/u H/H - transfusion threshold 8 - lower dose heparin gtt, f/u PM aPTT - holding ASA, apixaban - f/u renal US - continue Foley catheter (8/24-) - wean CBI as tolerated - IVC filter placement planned 8/25  Sepsis With leukocytosis with neutrophilic predominance, tachypnea, tachycardia. Also with elevated lactate. Unclear source, but suspect urologic source given history.  So far, CXR, CT A/P, Covid test unrevealing.  UA obtained s/p bladder irrigation, so it may not be accurate.  Patient was initially started on IV vancomycin and IV cefepime, but vancomycin discontinued given low likelihood of MRSA and covering mainly for uropathogens.  We will continue cefepime for now and follow-up urine culture.  Now no longer meeting sepsis criteria.  Vitals WNL and leukocytosis slightly improved this morning.  Lactate now WNL, improved with IV fluids. - continue IV cefepime (8/23-) - s/p vancomycin (8/23) - f/u urine cx - IV fluids  DVT Patient was found to have bilateral LE DVTs during previous admission in the setting of known malignancy.  Presumed to have PE; however, not officially diagnosed because patient declined CTA imaging.  Case was discussed with urology who recommended lifelong anticoagulation, so patient had been discharged on apixaban. - Holding apixaban - Restarting low-dose heparin GTT as above  AKI Nonoliguric.  Presented with elevated creatinine 1.22, likely prerenal in the setting of hypovolemia secondary to blood loss.  Now resolved with IV  fluids. - monitor BMP  Hypokalemia K 3.2 on presentation, slightly improved this morning to K 3.3 with repletion. - trend BMP   HTN Patient with history of hypertension;  however, patient had soft blood pressures on admission with lowest 104/78. Home meds include: Losartan 25mg  QD, Metoprolol 25mg  QD, Amlodipine 10mg  QD. Goal BP: 130-150. - holding home meds for now, can restart as needed  T2DM A1C 6.5%. Home meds: Metformin 500mg  QD. Well-controlled on sliding scale insulin. 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  She has known carotid disease s/p endovascular stent placement on 7/8. Carotid ultrasound during last admission notable for patency with <50% stenosis. Echo did not show density. TEE as postponed per neurology and cardiology recommendations. Home meds: ASA, Atorvastatin 40mg  QD. Patient instructed to continue ASA but discontinue plavix. - Holding ASA for now - Continue atorvastatin  Severe protein calorie malnutrition Low albumin 2.5. - consider starting nutritional supplement  Uterine Mass Chronic, stable.  Uterine fundal mass noted on last CT abd/pelvis on 8/5 measuring 8x12x11. CT Abd/Pelvis on admission notable for enhancing heterogenous mass off uterine fundus measuring 8.6cm consistent with a large exophytic fibroid. Per chart review, patient to have repeat outpatient TVUS.   -outpatient TVUS  FEN/GI: regular diet, NPO at midnight, NS @ 125 ml/hr PPx: heparin gtt  Disposition: med-surg  Subjective:  Overnight, patient was admitted and had bedside clot irrigation and CBI done by urology.  This morning, patient did not express any concerns and wanted to go back to sleep.  Objective: Temp:  [98.2 F (36.8 C)] 98.2 F (36.8 C) (08/23 1451) Pulse Rate:  [65-106] 66 (08/24 0400) Resp:  [14-22] 16 (08/24 0400) BP: (97-128)/(51-74) 102/57 (08/24 0400) SpO2:  [94 %-100 %] 96 % (08/24 0400) Weight:  [80.5 kg] 80.5 kg (08/23 2300) Physical Exam: General: Alert, elderly female lying in  bed, NAD Cardiovascular: RRR, no murmurs Respiratory: CTAB, breathing comfortably on room air Abdomen: soft, non-tender Extremities: WWP GU: Foley bag with amber urine, no signs of active bleeding  Laboratory: Recent Labs  Lab 10/02/19 1312 10/03/19 0906 10/05/19 1641  WBC 7.4 6.9 18.5*  HGB 9.7* 10.1* 9.4*  HCT 30.9* 32.4* 31.2*  PLT 299 333 468*   Recent Labs  Lab 10/05/19 1641  NA 138  K 3.2*  CL 101  CO2 21*  BUN 11  CREATININE 1.22*  CALCIUM 8.8*  PROT 5.8*  BILITOT 0.6  ALKPHOS 90  ALT 16  AST 21  GLUCOSE 261*     Imaging/Diagnostic Tests: No new imaging.  Zola Button, MD 10/06/2019, 7:00 AM PGY-1, [place] Intern pager: [phone], text pages welcome

## 2019-10-06 NOTE — Progress Notes (Signed)
New Admission Note:  Arrival Method: Stretcher Mental Orientation: Alert and oriented x 3-4 Telemetry: N/A Assessment: Completed Skin: Warm and dry  IV: NSL Pain: Denies  Tubes: 3 Way foley /CBI Safety Measures: Safety Fall Prevention Plan initiated.  Admission: Completed 5 M  Orientation: Patient has been orientated to the room, unit and the staff. Welcome booklet given.  Family: None  Orders have been reviewed and implemented. Will continue to monitor the patient. Call light has been placed within reach and bed alarm has been activated.   Sima Matas BSN, RN  Phone Number: [phone]

## 2019-10-06 NOTE — Progress Notes (Signed)
Report received from ER RN.

## 2019-10-06 NOTE — Progress Notes (Signed)
[place] for Heparin Indication: Recent DVT on Apixaban PTA holding for possible procedure   No Known Allergies  Patient Measurements: Height: 5\' 4"  (162.6 cm) Weight: 80.5 kg (177 lb 7.5 oz) IBW/kg (Calculated) : 54.7 Heparin Dosing Weight: 72 kg  Vital Signs: Temp: 97.8 F (36.6 C) (08/24 0946) Temp Source: Oral (08/24 0946) BP: 111/53 (08/24 1030) Pulse Rate: 72 (08/24 1030)  Labs: Recent Labs    10/05/19 1641 10/06/19 0048 10/06/19 0740 10/06/19 0748 10/06/19 0914  HGB 9.4*  --  6.6*  --   --   HCT 31.2*  --  22.4*  --   --   PLT 468*  --  369  --   --   APTT  --  46* >200*  --  198*  LABPROT 21.0*  --  20.2*  --   --   INR 1.9*  --  1.8*  --   --   HEPARINUNFRC  --   --   --  >2.20*  --   CREATININE 1.22*  --  0.69  --   --     Estimated Creatinine Clearance: 70 mL/min (by C-G formula based on SCr of 0.69 mg/dL).   Medical History: Past Medical History:  Diagnosis Date  . Carotid arterial disease ([place]) 07/2019  . Diabetes ([place])   . HTN (hypertension)   . Hyperlipidemia   . Stroke [facility])    acute/subacute left MCA infarct 08/12/19    Medications:  Scheduled:  . sodium chloride   Intravenous Once  . atorvastatin  80 mg Oral QHS  . insulin aspart  0-9 Units Subcutaneous TID WC  . potassium chloride  40 mEq Oral Once    Assessment: Patient is a 89 yof that is being admitted for gross hematuria. Patient has a hx of urothelial cell carcinoma of the bladder, stroke, and bilateral DVT. Pharmacy has been consulted to dose heparin for the treatment of the bilateral DVT's and possible PE. Patient was on Apixaban PTA. Last dose of apixaban was at 0900 on 8/23. Will trend HL and aPTT until correlation. Patient aPTT was >220 and HL >2.20 on 8/24 at 0740. Contacted nurse who indicated level was drawn from IV line that was running heparin. Heparin was stopped at 0850. Ordered a redraw of the aPTT which result at 198 which was still  drawn from the IV line that was running heparin, but the line was cleared with 20cc of fluid and heparin was being held at that time. Patient's urine is clear this morning but Hgb is downtrending to 6.6 and requires transfusion. Will restart heparin therapy due to increased risk of clotting secondary to DVT's and cancer diagnosis. Monitor closely for bleeding. Will target lower aPTT goal due to bleed risk.   Goal of Therapy:  aPTT 66-84 seconds HL 0.3-0.5 Monitor platelets by anticoagulation protocol: Yes   Plan:  - Restart heparin drip @ 1000 units/hr now   - Will monitor heparin using aPTT until aPTT and Heparin levels correlate - aPTT level in ~ 6 hours  - Daily heparin level, aPTT, and CBC - Monitor patient for s/s of worsening bleeding and CBC while on heparin  - Monitor clinical scenario, treatment team discussing discontinuation of anticoagulation and placement of IVC filter   Cephus Slater, PharmD, [facility] Pharmacy Resident [phone] 10/06/2019 10:50 AM

## 2019-10-07 ENCOUNTER — Inpatient Hospital Stay (HOSPITAL_COMMUNITY): Payer: Medicare Other

## 2019-10-07 DIAGNOSIS — R310 Gross hematuria: Secondary | ICD-10-CM

## 2019-10-07 DIAGNOSIS — C679 Malignant neoplasm of bladder, unspecified: Principal | ICD-10-CM

## 2019-10-07 DIAGNOSIS — I824Z3 Acute embolism and thrombosis of unspecified deep veins of distal lower extremity, bilateral: Secondary | ICD-10-CM

## 2019-10-07 HISTORY — PX: IR IVC FILTER PLMT / S&I /IMG GUID/MOD SED: IMG701

## 2019-10-07 LAB — BASIC METABOLIC PANEL
Anion gap: 10 (ref 5–15)
BUN: 5 mg/dL — ABNORMAL LOW (ref 8–23)
CO2: 20 mmol/L — ABNORMAL LOW (ref 22–32)
Calcium: 8 mg/dL — ABNORMAL LOW (ref 8.9–10.3)
Chloride: 109 mmol/L (ref 98–111)
Creatinine, Ser: 0.52 mg/dL (ref 0.44–1.00)
GFR calc Af Amer: >60 mL/min (ref >60)
GFR calc non Af Amer: >60 mL/min (ref >60)
Glucose, Bld: 91 mg/dL (ref 70–99)
Potassium: 3.6 mmol/L (ref 3.5–5.1)
Sodium: 139 mmol/L (ref 135–145)

## 2019-10-07 LAB — GLUCOSE, CAPILLARY
Glucose-Capillary: 100 mg/dL — ABNORMAL HIGH (ref 70–99)
Glucose-Capillary: 104 mg/dL — ABNORMAL HIGH (ref 70–99)
Glucose-Capillary: 90 mg/dL (ref 70–99)
Glucose-Capillary: 94 mg/dL (ref 70–99)
Glucose-Capillary: 96 mg/dL (ref 70–99)

## 2019-10-07 LAB — BPAM RBC
Blood Product Expiration Date: 202109222359
Blood Product Expiration Date: 202109222359
ISSUE DATE / TIME: 202108240922
ISSUE DATE / TIME: 202108241427
Unit Type and Rh: 5100
Unit Type and Rh: 5100

## 2019-10-07 LAB — CBC
HCT: 28.7 % — ABNORMAL LOW (ref 36.0–46.0)
Hemoglobin: 9.3 g/dL — ABNORMAL LOW (ref 12.0–15.0)
MCH: 28.3 pg (ref 26.0–34.0)
MCHC: 32.4 g/dL (ref 30.0–36.0)
MCV: 87.2 fL (ref 80.0–100.0)
Platelets: 330 10*3/uL (ref 150–400)
RBC: 3.29 MIL/uL — ABNORMAL LOW (ref 3.87–5.11)
RDW: 16.9 % — ABNORMAL HIGH (ref 11.5–15.5)
WBC: 9.7 10*3/uL (ref 4.0–10.5)
nRBC: 0 % (ref 0.0–0.2)

## 2019-10-07 LAB — TYPE AND SCREEN
ABO/RH(D): O POS
Antibody Screen: NEGATIVE
Unit division: 0
Unit division: 0

## 2019-10-07 LAB — MRSA PCR SCREENING: MRSA by PCR: NEGATIVE

## 2019-10-07 LAB — HEPARIN LEVEL (UNFRACTIONATED)
Heparin Unfractionated: 0.85 IU/mL — ABNORMAL HIGH (ref 0.30–0.70)
Heparin Unfractionated: 0.38 IU/mL (ref 0.30–0.70)

## 2019-10-07 LAB — APTT: aPTT: 136 seconds — ABNORMAL HIGH (ref 24–36)

## 2019-10-07 IMAGING — XA IR IVC FILTER PLMT / S&I /IMG GUID/MOD SED
4 series · 14 of 24 positions shown · IV contrast (IODINE)
Comparison: none

INDICATION: 69-year-old female with bilateral lower extremity DVT and gross
hematuria, not a candidate for anticoagulation. She has been
referred for IVC filter placement

[Series 1: body 4 care · 2 of 5 slices shown (1 of 3)]
[im 1/5]
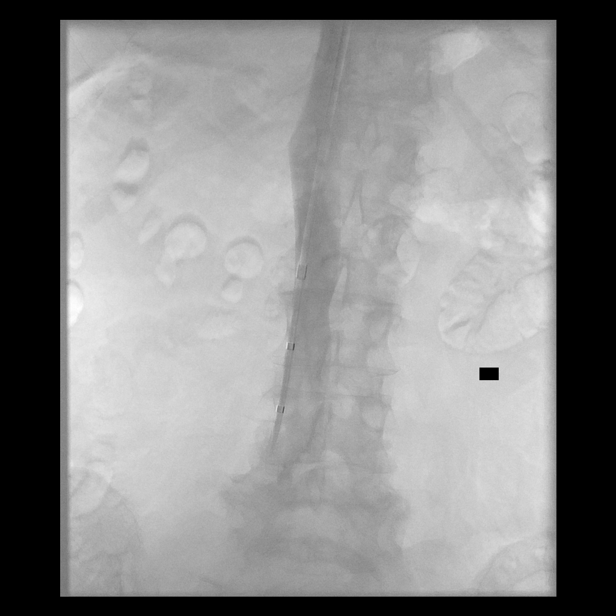
[im 3/5]
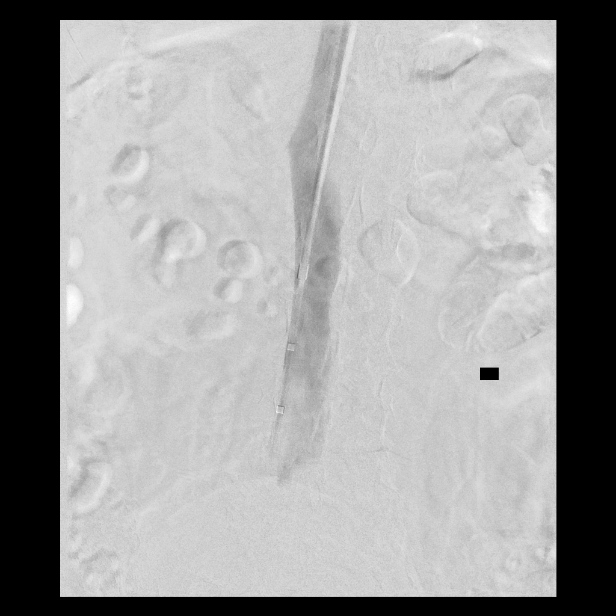

[Series 2: ir ivc filter plmt / s&i /img guid/mod sed · 2 of 3 slices shown]
[im 1/3]
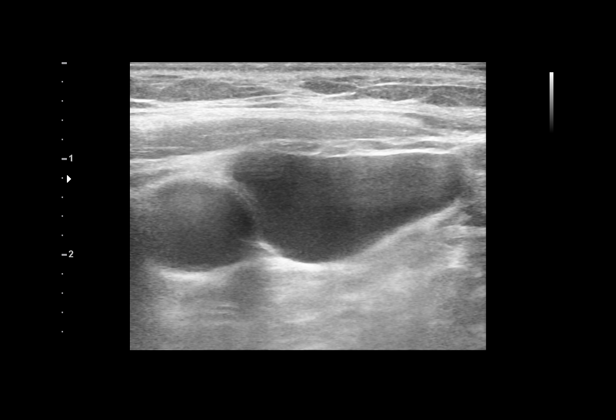
[im 3/3]
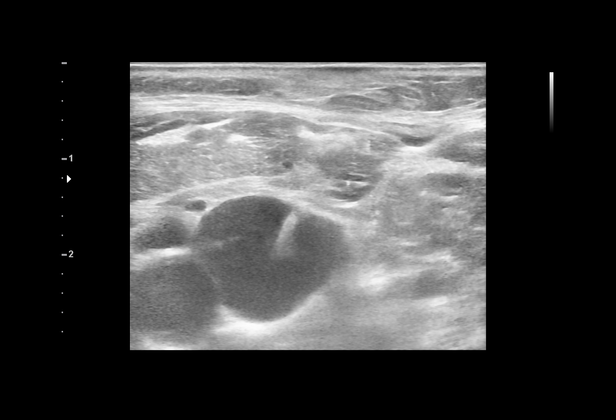

[Series 2: body 4 care · 6 of 11 slices shown (2 of 3)]
[im 1/11]
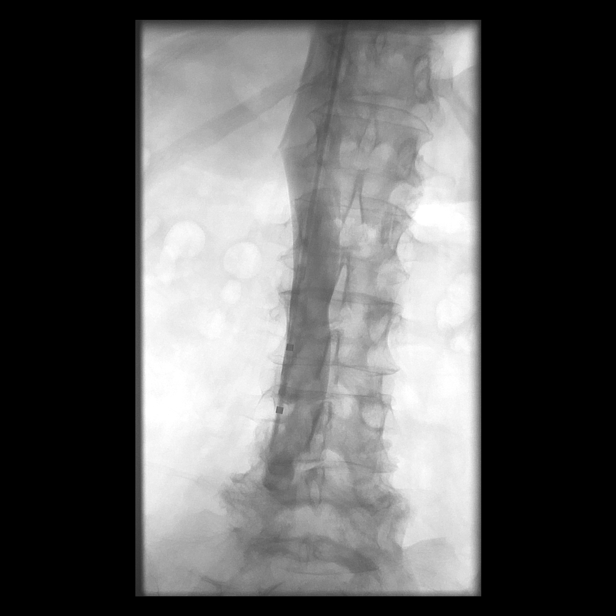
[im 3/11]
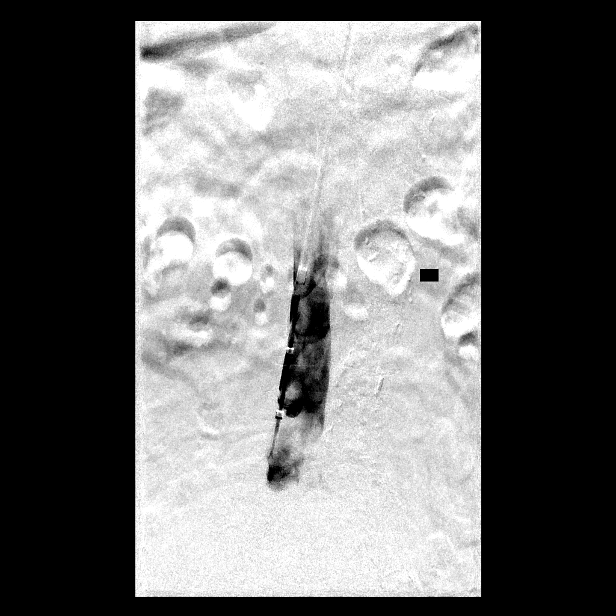
[im 5/11]
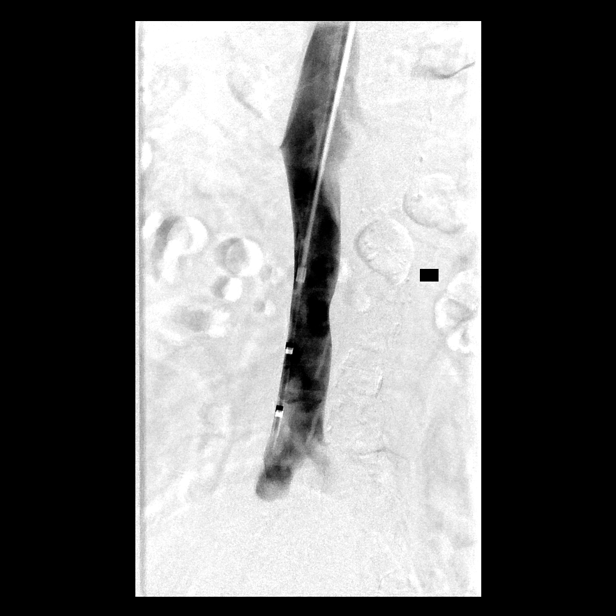
[im 6/11]
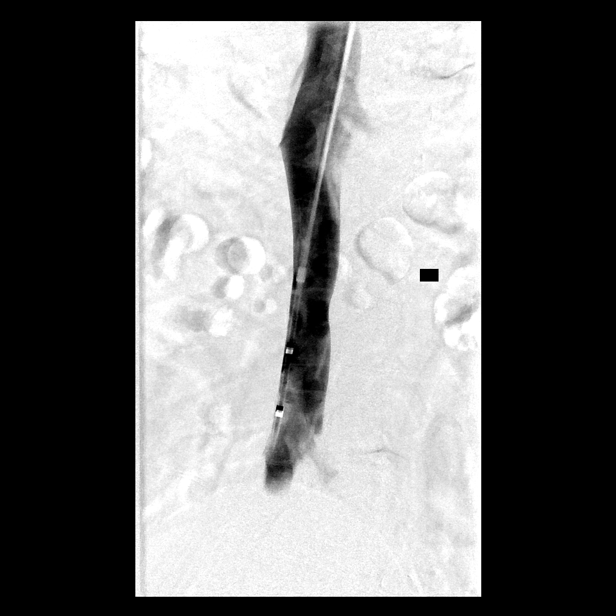
[im 8/11]
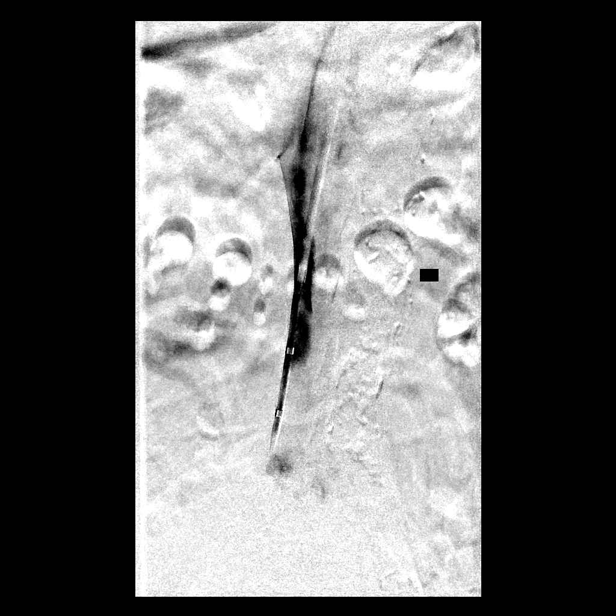
[im 10/11]
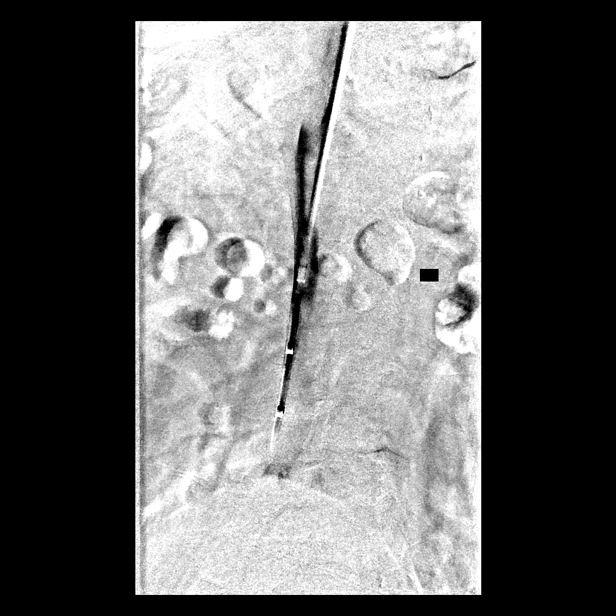

[Series 3: body 4 care · 4 of 6 slices shown (3 of 3)]
[im 1/6]
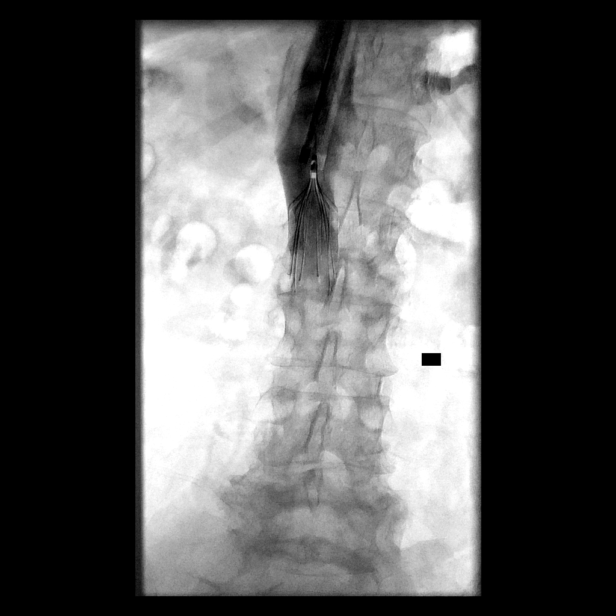
[im 2/6]
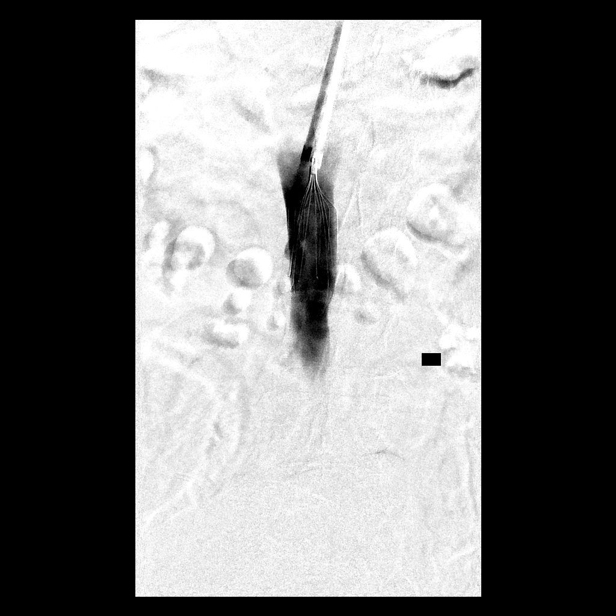
[im 4/6]
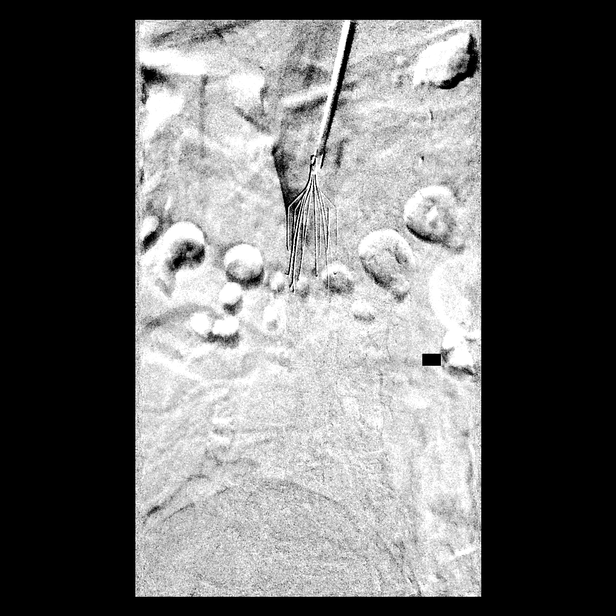
[im 6/6]
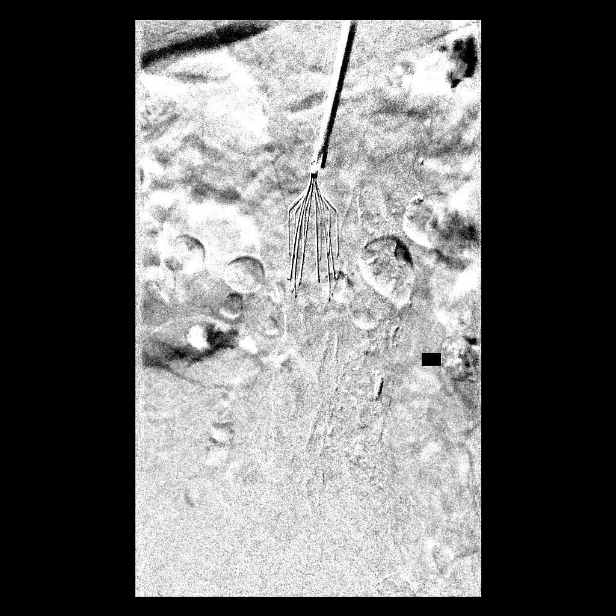

[14 of 24 positions shown; findings below may reference images not displayed]

EXAM:
ULTRASOUND-GUIDED ACCESS RIGHT INTERNAL JUGULAR VEIN

PLACEMENT OF RETRIEVABLE IVC FILTER

MEDICATIONS:
None.

ANESTHESIA/SEDATION:
1.0 mg IV Versed; 25 mcg IV Fentanyl

Moderate Sedation Time:  10 minutes

The patient was continuously monitored during the procedure by the
interventional radiology nurse under my direct supervision.

FLUOROSCOPY TIME:  Fluoroscopy Time: 0 minutes 42 seconds (34 mGy).

COMPLICATIONS:
None

PROCEDURE:
The procedure, risks, benefits, and alternatives were explained to
the patient. Specific risks discussed include bleeding, infection,
contrast reaction, renal failure, IVC filter fracture, migration,
iliocaval thrombus (3-4% incidence), need for further procedure,
need for further surgery, pulmonary embolism, cardiopulmonary
collapse, death. Questions regarding the procedure were encouraged
and answered. The patient understands and consents to the procedure.

Ultrasound survey was performed with images stored and sent to PACs.

The neck was prepped with chlorhexidine in a sterile fashion, and a
sterile drape was applied covering the operative field. A sterile
gown and sterile gloves were used for the procedure. Local
anesthesia was provided with 1% Lidocaine.

A micropuncture needle was used access the right internal jugular
vein under ultrasound. With excellent venous blood flow returned,
and an .018 micro wire was passed through the needle. Small incision
was made with an 11 blade scalpel. The needle was removed, and a
micropuncture sheath was placed over the wire. The inner dilator and
wire were removed, and an 035 Bentson wire was advanced under
fluoroscopy into the IVC. Serial dilation of the soft tissue tract
was performed with an 8 French dilator and subsequently a 10 French
dilator.

The delivery sheath for a retrievable SUET FONG filter was passed
over the Bentson wire into the IVC. The wire was removed and small
contrast was used to confirm IVC location.

IVC cavagram performed.

Dilator was removed, and the IVC filter was then delivered,
positioned below the lowest renal vein. Repeat cavagram performed,
and the catheter was removed.

Manual pressure was used for hemostasis.

Patient tolerated the procedure well and remained hemodynamically
stable throughout.

No complications were encountered and no significant blood loss was
encounter.
IMPRESSION: Status post placement of retrievable IVC filter, transjugular

PLAN:
This IVC filter is potentially retrievable. The patient can be
approximately 3-6 months. Further recommendations regarding filter
retrieval, continued surveillance or declaration of device
permanence, will be made at that time.

## 2019-10-07 MED ORDER — CEPHALEXIN 500 MG PO CAPS
500.0000 mg | ORAL_CAPSULE | Freq: Two times a day (BID) | ORAL | Status: AC
  Administered 2019-10-07 – 2019-10-10 (×6): 500 mg via ORAL
  Filled 2019-10-07 (×6): qty 1

## 2019-10-07 MED ORDER — MIDAZOLAM HCL 2 MG/2ML IJ SOLN
INTRAMUSCULAR | Status: AC | PRN
  Administered 2019-10-07: 1 mg via INTRAVENOUS

## 2019-10-07 MED ORDER — RAMELTEON 8 MG PO TABS
8.0000 mg | ORAL_TABLET | Freq: Every day | ORAL | Status: DC
  Administered 2019-10-07 – 2019-10-13 (×7): 8 mg via ORAL
  Filled 2019-10-07 (×7): qty 1

## 2019-10-07 MED ORDER — FENTANYL CITRATE (PF) 100 MCG/2ML IJ SOLN
INTRAMUSCULAR | Status: AC
Start: 2019-10-07 — End: 2019-10-07
  Filled 2019-10-07: qty 2

## 2019-10-07 MED ORDER — MIDAZOLAM HCL 2 MG/2ML IJ SOLN
INTRAMUSCULAR | Status: AC
  Filled 2019-10-07: qty 2

## 2019-10-07 MED ORDER — LIDOCAINE HCL (PF) 1 % IJ SOLN
INTRAMUSCULAR | Status: AC | PRN
  Administered 2019-10-07: 10 mL

## 2019-10-07 MED ORDER — FENTANYL CITRATE (PF) 100 MCG/2ML IJ SOLN
INTRAMUSCULAR | Status: AC | PRN
  Administered 2019-10-07: 25 ug via INTRAVENOUS

## 2019-10-07 MED ORDER — IOHEXOL 300 MG/ML  SOLN
100.0000 mL | Freq: Once | INTRAMUSCULAR | Status: AC | PRN
  Administered 2019-10-07: 30 mL via INTRAVENOUS

## 2019-10-07 MED ORDER — ASPIRIN 81 MG PO CHEW
81.0000 mg | CHEWABLE_TABLET | Freq: Every day | ORAL | Status: DC
  Administered 2019-10-08 – 2019-10-14 (×7): 81 mg via ORAL
  Filled 2019-10-07 (×7): qty 1

## 2019-10-07 MED ORDER — LIDOCAINE HCL 1 % IJ SOLN
INTRAMUSCULAR | Status: AC
  Filled 2019-10-07: qty 20

## 2019-10-07 NOTE — Procedures (Signed)
Interventional Radiology Procedure Note  Procedure: Placement of a retrievable IVC filter,  right IJ approach just below the renal veins.   Complications: None  Recommendations:  - VIR can assess the patient in 3-6 months for potential retrieval in the clinic. (This note has been forwarded to clinic) - Do not submerge for 7 days - Routine wound care   Signed,  Dulcy Fanny. Earleen Newport, DO

## 2019-10-07 NOTE — Progress Notes (Signed)
INSTRUCTIONS FOR CONTINUOUS BLADDER IRRIGATION (CBI)  There is no set numerical rate to the CBI.   The flow rate is regulated using the following guidelines: - Bright red drainage - infuse irrigation solution with the roller clamp wide open until drainage appears pink or clear. If drainage does not clear, leave irrigation wide open, notify the physician, and monitor for hypovolemic shock. - Pink or tea coloured - infuse irrigation solution at a moderate rate. - Clear drainage - infuse irrigation solution at a slow rate.  Ensure drainage bag is emptied as needed to ensure proper outflow.  Note: If irrigant cannot adequately flow from bladder due to clots, tubing kinks, or sediment bladder discomfort, distention, or possible injury may occur. Report to physician if irrigant does not flow freely, patient complains of pain, or bladder distention occurs.  References: Lorrin Mais, Mamie Nick & Ostendorf, W. (2014). Clinical nursing skills & techniques, (8th Ed.) [place], [place]: [place], [place]. 168-372.  Francee Nodal & Schub T. (2016). Bladder irrigation: Continuous -- Performing. [place], [place], PGY-3 10/07/2019 3:09 PM

## 2019-10-07 NOTE — Progress Notes (Addendum)
Family Medicine Teaching Service Daily Progress Note Intern Pager: [phone]  Patient name: Lisa [name] Medical record number: [id number] Date of birth: [birth year] Age: 69 y.o. Gender: female  Primary Care Provider: Patient, No Pcp Per Consultants: Urology, IR   Code Status: Full Code  Pt Overview and Major Events to Date:  8/23 Admitted 8/24 bedside clot irrigation, 2u pRBC 8/25 IVC filter placement  Assessment and Plan: Lisa [name] is a 69 y.o. female who presents with gross hematuria and blood loss anemia in setting of known papillary carcinoma of the bladder. PMHx significant for: T1 UCC of the bladder, bilateral DVTs, CVA, HTN, HLD, T2DM.  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  CT abdomen revealed a large amount of clot burden within the bladder as well as a small area of extravasation on the bladder base.  Urology was consulted, Dr. Lovena [name] performed bedside clot irrigation and initiated CBI 8/24.  Suspect that recurrence of hematuria is secondary to bladder distention in the setting of recent CVA, causing scab to come off resulting in bleed.  Patient has also been on anticoagulation for DVT and presumed PE treatment, likely contributing to her bleeding.  Apparently, patient was still receiving DAPT for recent CVA at SNF even though discharge instructions stated she should be on ASA only. She has required multiple urological interventions to control her bleeding.  Plan for Foley catheter for 7 to 10 days to allow bladder to heal, will wean CBI as tolerated.  Appreciate involvement of urology and interventional radiology. Yesterday, patient received 2u pRBC which responded appropriately and is stable this morning.  Renal US did not show any residual bladder clot.  This morning, patient  underwent successful IVC filter placement.  We will plan to stop heparin drip and consider restarting ASA tomorrow.  This afternoon, patient did have some blood noted in Foley bag on slow CBI rate, may need to increase rate. - transfusion threshold 8 - holding ASA, apixaban; consider restarting ASA tomorrow - continue Foley catheter (8/24-) - increase CBI (no set rate, increase until urine is clear), wean CBI as tolerated to keep urine clear  SIRS Now resolved, sepsis ruled out.  Initially presented with leukocytosis with neutrophilic predominance, mild tachypnea, mild tachycardia.  Vital signs WNL. No infectious source identified thus far, urine culture without evidence of infection. Plan to de-escalate antibiotics and treat empirically for UTI. - start cephalexin tomorrow (8/25), plan for 5-day total course of abx to end 8/27 - s/p IV cefepime (8/23-8/24) - s/p vancomycin (8/23) - IV fluids  DVT Patient was found to have bilateral LE DVTs during previous admission in the setting of known malignancy.  Presumed to have PE; however, not officially diagnosed because patient declined CTA imaging.  Case was discussed with urology who recommended lifelong anticoagulation, so patient had been discharged on apixaban. Now s/p IVC filter placement, goal to discontinue anticoagulation. - heparin gtt stopped - Holding apixaban  HTN Patient with history of hypertension; however, patient had soft blood pressures on admission with lowest 104/78. Home meds include: Losartan 25mg  QD, Metoprolol 25mg  QD, Amlodipine 10mg  QD. Goal BP: 130-150. - holding home meds for now, can restart as needed  T2DM A1C 6.5%. Home meds: Metformin 500mg  QD. Well-controlled on sliding scale insulin. 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  She has known carotid  disease s/p endovascular stent placement on 7/8. Carotid ultrasound during last admission notable for patency with <50% stenosis. Echo did not show density. TEE as postponed per neurology and cardiology recommendations. Home meds: ASA, Atorvastatin 40mg  QD. Patient instructed to continue ASA but discontinue plavix. - Holding ASA for now, consider restarting tomorrow - Continue atorvastatin  Severe protein calorie malnutrition Low albumin 2.5. - consider starting nutritional supplement  Uterine Mass Chronic, stable.  Uterine fundal mass noted on last CT abd/pelvis on 8/5 measuring 8x12x11. CT Abd/Pelvis on admission notable for enhancing heterogenous mass off uterine fundus measuring 8.6cm consistent with a largeexophytic fibroid. Per chart review, patient to have repeat outpatient TVUS.   -outpatient TVUS  FEN/GI: regular diet, NS @ 125 ml/hr PPx: SCDs  Disposition: med-surg  Subjective:  NAOE.  Patient not seen this morning as she was in IR suite.  This afternoon, patient without any complaints.  She states the procedure went well.  Objective: Temp:  [97.6 F (36.4 C)-98.4 F (36.9 C)] 98 F (36.7 C) (08/25 0621) Pulse Rate:  [67-82] 79 (08/25 0621) Resp:  [16-18] 18 (08/25 0621) BP: (108-151)/(50-78) 144/66 (08/25 0621) SpO2:  [98 %-100 %] 100 % (08/25 5277) Weight:  [80 kg] 80 kg (08/24 2111) Physical Exam: General: Alert, elderly female lying in bed, NAD Neck: Dressing on right anterior neck with moderate blood, not saturated Cardiovascular: RRR, no murmurs Respiratory: CTAB, breathing comfortably on room air Abdomen: soft, non-tender Extremities: WWP GU: Foley bag with strawberry-colored urine, see image below:    Laboratory: Recent Labs  Lab 10/05/19 1641 10/05/19 1641 10/06/19 0740 10/06/19 2118 10/07/19 0421  WBC 18.5*  --  14.3*  --  9.7  HGB 9.4*   < > 6.6* 9.7* 9.3*  HCT 31.2*   < > 22.4* 30.8* 28.7*  PLT 468*  --  369  --  330   < > = values in this  interval not displayed.   Recent Labs  Lab 10/05/19 1641 10/06/19 0740 10/07/19 0421  NA 138 137 139  K 3.2* 3.3* 3.6  CL 101 107 109  CO2 21* 20* 20*  BUN 11 11 5*  CREATININE 1.22* 0.69 0.52  CALCIUM 8.8* 7.8* 8.0*  PROT 5.8*  --   --   BILITOT 0.6  --   --   ALKPHOS 90  --   --   ALT 16  --   --   AST 21  --   --   GLUCOSE 261* 116* 91   Urine culture: <10,000 COLONIES/mL INSIGNIFICANT GROWTH   Imaging/Diagnostic Tests: Renal US IMPRESSION: 1. Left renal stone on CT yesterday is not well seen by ultrasound. 2. No obstructive uropathy. 3. Urinary bladder is decompressed by Foley catheter not well assessed.  Zola Button, MD 10/07/2019, 8:14 AM PGY-1, [place] Intern pager: [phone], text pages welcome

## 2019-10-07 NOTE — Progress Notes (Signed)
[place] for Heparin Indication: Recent DVT on Apixaban PTA holding for possible procedure   No Known Allergies  Patient Measurements: Height: 5\' 4"  (162.6 cm) Weight: 80 kg (176 lb 5.9 oz) IBW/kg (Calculated) : 54.7 Heparin Dosing Weight: 72 kg  Vital Signs: Temp: 98 F (36.7 C) (08/25 0215) Temp Source: Oral (08/25 0215) BP: 150/78 (08/25 0215) Pulse Rate: 80 (08/25 0215)  Labs: Recent Labs    10/05/19 1641 10/06/19 0048 10/06/19 0740 10/06/19 0740 10/06/19 0748 10/06/19 0914 10/06/19 2118 10/07/19 0421  HGB 9.4*  --  6.6*  --   --   --  9.7*  --   HCT 31.2*  --  22.4*  --   --   --  30.8*  --   PLT 468*  --  369  --   --   --   --   --   APTT  --    < > >200*   < >  --  198* 104* 136*  LABPROT 21.0*  --  20.2*  --   --   --   --   --   INR 1.9*  --  1.8*  --   --   --   --   --   HEPARINUNFRC  --   --   --   --  >2.20*  --   --  0.85*  CREATININE 1.22*  --  0.69  --   --   --   --   --    < > = values in this interval not displayed.    Estimated Creatinine Clearance: 69.8 mL/min (by C-G formula based on SCr of 0.69 mg/dL).   Assessment: 69 yr old female admitted for gross hematuria. Patient has a hx of urothelial cell carcinoma of the bladder, stroke, and bilateral DVT. Pharmacy was consulted to dose heparin for the treatment of bilateral DVTs and possible PE. Patient was on apixaban PTA (last dose of  was at 0900 on 8/23). Due to recent apixaban exposure, plan to monitor anticoagulation with aPTT until aPTT and heparin levels correlate. Patient's urine was clear this morning but Hgb is downtrending to 6.6, requiring transfusion; repeat Hbg this evening is up to 9.7. Will target lower aPTT goal, due to bleeding risk.   Heparin level 0.85 and PTT 136 sec (appear to be correlating and both supratherapeutic). Urine still pink but better than it was yesterday.  Goal of Therapy:  HL 0.3-0.5 units/ml Monitor platelets by  anticoagulation protocol: Yes   Plan:  Decrease heparin infusion to 650 units/hr Check heparin level in 6 hours  Sherlon Handing, PharmD, BCPS Please see amion for complete clinical pharmacist phone list 10/07/2019 5:15 AM

## 2019-10-07 NOTE — Progress Notes (Signed)
Patient states that she does not want her SCDs on. Patient want educated on the risks of not having her scds on.

## 2019-10-07 NOTE — Progress Notes (Deleted)
Patient request not to have house keeping in his room.

## 2019-10-07 NOTE — Plan of Care (Signed)
  Problem: Education: Goal: Knowledge of General Education information will improve Description Including pain rating scale, medication(s)/side effects and non-pharmacologic comfort measures Outcome: Progressing   

## 2019-10-08 LAB — BASIC METABOLIC PANEL
Anion gap: 15 (ref 5–15)
BUN: <5 mg/dL — ABNORMAL LOW (ref 8–23)
CO2: 18 mmol/L — ABNORMAL LOW (ref 22–32)
Calcium: 8.1 mg/dL — ABNORMAL LOW (ref 8.9–10.3)
Chloride: 104 mmol/L (ref 98–111)
Creatinine, Ser: 0.54 mg/dL (ref 0.44–1.00)
GFR calc Af Amer: >60 mL/min (ref >60)
GFR calc non Af Amer: >60 mL/min (ref >60)
Glucose, Bld: 104 mg/dL — ABNORMAL HIGH (ref 70–99)
Potassium: 3 mmol/L — ABNORMAL LOW (ref 3.5–5.1)
Sodium: 137 mmol/L (ref 135–145)

## 2019-10-08 LAB — CBC
HCT: 29.1 % — ABNORMAL LOW (ref 36.0–46.0)
Hemoglobin: 9.3 g/dL — ABNORMAL LOW (ref 12.0–15.0)
MCH: 27.8 pg (ref 26.0–34.0)
MCHC: 32 g/dL (ref 30.0–36.0)
MCV: 86.9 fL (ref 80.0–100.0)
Platelets: 365 10*3/uL (ref 150–400)
RBC: 3.35 MIL/uL — ABNORMAL LOW (ref 3.87–5.11)
RDW: 16.5 % — ABNORMAL HIGH (ref 11.5–15.5)
WBC: 9.8 10*3/uL (ref 4.0–10.5)
nRBC: 0 % (ref 0.0–0.2)

## 2019-10-08 LAB — GLUCOSE, CAPILLARY
Glucose-Capillary: 96 mg/dL (ref 70–99)
Glucose-Capillary: 114 mg/dL — ABNORMAL HIGH (ref 70–99)
Glucose-Capillary: 120 mg/dL — ABNORMAL HIGH (ref 70–99)
Glucose-Capillary: 113 mg/dL — ABNORMAL HIGH (ref 70–99)

## 2019-10-08 MED ORDER — POTASSIUM CHLORIDE CRYS ER 20 MEQ PO TBCR
40.0000 meq | EXTENDED_RELEASE_TABLET | Freq: Four times a day (QID) | ORAL | Status: AC
  Administered 2019-10-08 (×3): 40 meq via ORAL
  Filled 2019-10-08 (×3): qty 2

## 2019-10-08 MED ORDER — AMLODIPINE BESYLATE 10 MG PO TABS
10.0000 mg | ORAL_TABLET | Freq: Every morning | ORAL | Status: DC
  Administered 2019-10-08 – 2019-10-14 (×7): 10 mg via ORAL
  Filled 2019-10-08 (×7): qty 1

## 2019-10-08 MED ORDER — LOSARTAN POTASSIUM 25 MG PO TABS
25.0000 mg | ORAL_TABLET | Freq: Every morning | ORAL | Status: DC
  Administered 2019-10-08 – 2019-10-14 (×7): 25 mg via ORAL
  Filled 2019-10-08 (×7): qty 1

## 2019-10-08 NOTE — TOC Initial Note (Addendum)
Transition of Care [facility]) - Initial/Assessment Note    Lisa [name] Details  Name: Lisa [name] MRN: [id number] Date of Birth: [birth year]  Transition of Care [facility]) CM/SW Contact:    Sable Feil, LCSW Phone Number: 10/08/2019, 1:12 PM  Clinical Narrative:  Talked initially with Lisa [name] at the bedside regarding Lisa [name] discharge plan. [name] was sitting up in bed and was awake, alert and able to talk with CSW. [name] reported that she was at [facility] but wants the facility in [place]. Lisa [name] added that Lisa [name] [name] has been in contact with this facility regarding Lisa [name] going there at d/c. When asked, Lisa [name] replied that she has been vaccinated approx 3/4 months ago. Lisa [name] advised that CSW would follow up with [facility]. Call made to [facility] and message left for admissions director Linus Salmons.  11:20 - Sister-in-law requested to talk with CSW. Visited room and spoke with Lisa [name] [facility] and sister-in-law) regarding Lisa [name] and Lisa [name] d/c plan. She confirmed that they want [facility] in [place] [facility] and that they have been talking with the admissions person there. CSW was advised that they may be able to accept Lisa [name] into one of the ALF beds (private pay) and transition Lisa [name] to a SNF bed when one becomes available and this was discussed. Lisa [name] indicated that Lisa [name] timeframe for being in rehab is 3 weeks. Lisa [name] expressed concern about Lisa [name] being able to take care of herself post discharge from rehab and this was discussed. Lisa [name] informed Lisa [name] that she wanted to talk with CSW outside of room regarding the [name] situation and Lisa [name] expressed agreement.   CSW and HCPOA talked separately regarding Lisa [name]'s [name] situation. CSW advised that Lisa [name] have been married for 30 years and have no children. She reported that Lisa [name] brother (Lisa [name]'s [name]) is an alcoholic and is currently in treatment. She  feels that Lisa [name] going [name] is risky as Lisa [name] brother is trying to stay sober. Another concern and need for rehab is that they live in a 2-story [name] and the stairs are a danger for Lisa [name]. They have talked about the plan post rehab and feel that ALF is best due to Lisa [name] recent strokes. Lisa [name] explained that Lisa [name]'s recent stokes (1st in June /21 and 2nd in July '21) have affected Lisa [name] cognition and decision making abilities. Lisa [name] explained that Lisa [name]'s mother lives in [place] in a facility and Lisa [name] has expressed wanting to move back to [facility]. Lisa [name] and Lisa [name] [name] have also talked about getting another [name] due to currently having a 2-story house. Lisa [name] informed that she will be kept updated and CSW will continue trying to reach [facility] admissions director.   2:20 pm: Talked with Elyse Hsu, admissions director at [facility]. She explained that she has no SNF bed availability right now, maybe next week. CSW asked about Lisa [name] going to the ALF and she is not sure about this. Elyse Hsu expressed that she will review Lisa [name]'s informaton once received and get back with CSW.              Expected Discharge Plan: [place] Barriers to Discharge: Continued Medical Work up, SNF Pending bed offer (Initiated facility search 8/26)   Lisa [name] Goals and CMS Choice Lisa [name] states their goals for this hospitalization and ongoing recovery are:: Lisa [name] and ister-in-law want Lisa [name] to get medically better and physically stronger before discharge. CMS Medicare.[place]  list provided to:: Other (Comment Required) (Talked with Lisa [name] and HCPOA regarding placement. Facility already chosen) Choice offered to / list presented to : [facility] POA / Guardian, Lisa [name] (CSW advised by Lisa [name] and HCPOA and she will discharge to [facility]  Expected Discharge Plan and Services Expected Discharge Plan: [place] In-house Referral:  Clinical Social Work     Living arrangements for the past 2 months: Lisa [name], Lisa [name] (Lisa [name] came back to hospital from SNF (was there a few days) and prior to this was in hospital. Lisa [name]) Expected Discharge Date: 10/09/19                                    Prior Living Arrangements/Services Living arrangements for the past 2 months: [place], [place] (Lisa [name] came back to hospital from SNF (was there a few days) and prior to this was in hospital. Lisa [name]) Lives with:: Spouse Lisa [name] language and need for interpreter reviewed:: No Do you feel safe going back to the place where you live?: No   Lisa [name] and HCPOA agreeable to more rehab at a skilled nursing facility  Need for Family Participation in Lisa [name] Care: Yes (Comment) Care giver support system in place?: No (comment)   Criminal Activity/Legal Involvement Pertinent to Current Situation/Hospitalization: No - Comment as needed  Activities of Daily Living [name] Assistive Devices/Equipment: None ADL Screening (condition at time of admission) Lisa [name]'s cognitive ability adequate to safely complete daily activities?: Yes Is the Lisa [name] deaf or have difficulty hearing?: No Does the Lisa [name] have difficulty seeing, even when wearing glasses/contacts?: No Does the Lisa [name] have difficulty concentrating, remembering, or making decisions?: Yes Lisa [name] able to express need for assistance with ADLs?: Yes Does the Lisa [name] have difficulty dressing or bathing?: No Independently performs ADLs?: Yes (appropriate for developmental age) Does the Lisa [name] have difficulty walking or climbing stairs?: Yes Weakness of Legs: Both Weakness of Arms/Hands: None  Permission Sought/Granted Permission sought to share information with : Family Supports (Sister-in-law/HCPOA [name]) Permission granted to share information with : Yes, Verbal  Permission Granted  Share Information with NAME: Tristan Schroeder and [place] granted to share info w AGENCY: [facility]  Permission granted to share info w Relationship: Sister-in-law/HCPOA and spouse  Permission granted to share info w Contact Information: Lisa [name] [phone]; Spouse - [phone]  Emotional Assessment Appearance:: Appears older than stated age Attitude/Demeanor/Rapport: Engaged Affect (typically observed): Appropriate, Pleasant Orientation: : Oriented to Self, Oriented to Place Alcohol / Substance Use: Tobacco Use, Alcohol Use, Illicit Drugs (Per H&P Lisa [name] quit smoking, does nor currently consume alcohol and does not use illicit drugs) Psych Involvement: No (comment)  Admission diagnosis:  Cancer ([place]) [C80.1] Gross hematuria [R31.0] Hematuria [R31.9] Lower leg DVT (deep venous thromboembolism), acute, bilateral ([place]) [I82.4Z3] Sepsis, due to unspecified organism, unspecified whether acute organ dysfunction present [facility]) [A41.9] Lisa [name] Active Problem List   Diagnosis Date Noted  . Lower leg DVT (deep venous thromboembolism), acute, bilateral ([place])   . Acute deep vein thrombosis (DVT) of proximal vein of both lower extremities (H[facility])   . Symptomatic anemia   . Weakness generalized   . Aphasia   . Palliative care by specialist   . DNR (do not resuscitate) discussion   . Primary papillary carcinoma of bladder ([place]) 09/17/2019  . Lacunar infarct, acute ([place]) 09/17/2019  . Acute blood  loss anemia 09/17/2019  . Pressure injury of skin 09/07/2019  . Gross hematuria 09/01/2019  . Hematuria 08/31/2019  . 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 Stenosis of carotid artery   . Abnormality of aortic valve   . DM (diabetes mellitus), type 2, uncontrolled w/neurologic complication ([place])   . Hypercholesteremia   . CVA (cerebral vascular accident) ([place]) 08/12/2019  . Acute ischemic stroke ([place])   .  Hypertension    PCP:  Lisa [name], No Pcp Per Pharmacy:   [id number] - [place], [place] - [address] [place] [address] [place] [address] [place] [id number] Phone: [phone] Fax: [phone]     Social Determinants of Health (SDOH) Interventions  No SDOH interventions requested or needed at this time  Readmission Risk Interventions No flowsheet data found.

## 2019-10-08 NOTE — Progress Notes (Signed)
Family Medicine Teaching Service Daily Progress Note Intern Pager: [phone]  Patient name: Lisa [name] Medical record number: [id number] Date of birth: [birth year] Age: 69 y.o. Gender: female  Primary Care Provider: Patient, No Pcp [name] Consultants: Urology, IR   Code Status: Full Code  Pt Overview and Major Events to Date:  8/23 Admitted 8/24 bedside clot irrigation, 2u pRBC 8/25 IVC filter placement  Assessment and Plan: Lisa [name] is a 69 y.o. female who presents with gross hematuria and blood loss anemia in setting of known papillary carcinoma of the bladder. PMHx significant for:T1 UCC of the bladder, bilateral DVTs, CVA, HTN, HLD, T2DM.  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 CT abdomen revealed a large amount of clot burden within the bladder as well as a small area of extravasation on the bladder base. Urology was consulted, Dr. Lovena Neighbours performed bedside clot irrigation and initiated CBI 8/24. Suspect that recurrence of hematuria is secondary to bladder distention in the setting of recent CVA, causing scab to come off resulting in bleed. Patient has also been on anticoagulation for DVT and presumed PE treatment, likely contributing to her bleeding.  Apparently, patient was still receiving DAPT for recent CVA at SNF even though discharge instructions stated she should be on ASA only.She has required multiple urological interventions to control her bleeding.  Plan for Foley catheter for 7 to 10 days to allow bladder to heal, will wean CBI as tolerated.  Appreciate involvement of urology and interventional radiology. This morning, urine remains clear on slow CBI drip.  Hemoglobin stable.  Medically stable for discharge to SNF once CBI is weaned off. - transfusion threshold 8 -  restart ASA - continue Foley catheter (8/24-) - wean CBI as tolerated to keep urine clear  SIRS Now resolved, sepsis ruled out.  Initially presented withleukocytosis withneutrophilic predominance, mild tachypnea, mild tachycardia. Vital signs WNL. No infectious source identified thus far, urine culture without evidence of infection. Plan to de-escalate antibiotics and treat empirically for UTI. - start cephalexin tomorrow (8/25), plan for 5-day total course of abx to end 8/27 - s/p IV cefepime (8/23-8/24) - s/p vancomycin (8/23) - IV fluids  DVT Patient was found to have bilateral LE DVTs during previous admission in the setting of known malignancy. Presumed to have PE; however, not officially diagnosed because patient declined CTA imaging. Case was discussed with urology who recommended lifelong anticoagulation,so patient had been discharged on apixaban. Now s/p IVC filter placement, plan to stop oral anticoagulation. - Discontinue apixaban  HTN Patient with history of hypertension;however, patient had soft blood pressures on admission with lowest 104/78. Home meds include: Losartan 25mg  QD, Metoprolol 25mg  QD, Amlodipine 10mg  QD. Goal BP: 130-150. Elevated this morning up to 179/75. - restart losartan and amlodipine - continue holding metoprolol for now  T2DM A1C 6.5%. Home meds: Metformin 500mg  QD. Well-controlled on sliding scale insulin. 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 She has known carotid disease s/p endovascular stent placement on 7/8. Carotid ultrasound during last admission notable for patency with <50% stenosis.  Ideally would be on DAPT, but given high bleeding risk, will continue patient on aspirin only.  Discussed with neurology and vascular surgery, they are in  agreement to continue ASA only and  stopping clopidogrel. Home meds: ASA, Atorvastatin 40mg  QD.  - Restarting ASA today  - Discontinue clopidogrel given high bleeding risk -Continue atorvastatin  Uterine Mass Chronic, stable.Uterine fundal mass noted on last CT abd/pelvis on 8/5 measuring 8x12x11. CT Abd/Pelvis on admission notable for enhancing heterogenous mass off uterine fundus measuring 8.6cm consistent with a largeexophytic fibroid. [name] chart review, patient to have repeat outpatient TVUS.  -outpatient TVUS  FEN/GI: regular diet PPx: SCDs  Disposition: med-surg, stable for SNF once weaned off from CBI  Subjective:  Overnight, patient was having some difficulty sleeping and having some anxiety.  She was started on ramelteon. This morning, patient without any concerns.  Objective: Temp:  [98 F (36.7 C)-98.5 F (36.9 C)] 98 F (36.7 C) (08/26 0647) Pulse Rate:  [78-87] 78 (08/26 0647) Resp:  [18-24] 18 (08/26 0647) BP: (146-161)/(65-88) 147/72 (08/26 0647) SpO2:  [98 %-100 %] 100 % (08/26 0647) Physical Exam: General:Alert, elderly female lying in bed, NAD Neck: Dressing on right anterior neck with small amount of blood, not saturated Cardiovascular:RRR, no murmurs Respiratory:CTAB, breathing comfortably on room air Abdomen:soft, non-tender Extremities:WWP Neuro: alert and oriented to location only, unable to state name or month or year (states she knows it but unable to express it) GU: foley bag with clear urine on CBI  Laboratory: Recent Labs  Lab 10/06/19 0740 10/06/19 0740 10/06/19 2118 10/07/19 0421 10/08/19 0159  WBC 14.3*  --   --  9.7 9.8  HGB 6.6*   < > 9.7* 9.3* 9.3*  HCT 22.4*   < > 30.8* 28.7* 29.1*  PLT 369  --   --  330 365   < > = values in this interval not displayed.   Recent Labs  Lab 10/05/19 1641 10/05/19 1641 10/06/19 0740 10/07/19 0421 10/08/19 0159  NA 138   < > 137 139 137  K 3.2*   < > 3.3* 3.6 3.0*  CL 101   < > 107 109 104  CO2 21*   < > 20* 20* 18*   BUN 11   < > 11 5* <5*  CREATININE 1.22*   < > 0.69 0.52 0.54  CALCIUM 8.8*   < > 7.8* 8.0* 8.1*  PROT 5.8*  --   --   --   --   BILITOT 0.6  --   --   --   --   ALKPHOS 90  --   --   --   --   ALT 16  --   --   --   --   AST 21  --   --   --   --   GLUCOSE 261*   < > 116* 91 104*   < > = values in this interval not displayed.    Imaging/Diagnostic Tests: No new imaging.  Zola Button, MD 10/08/2019, 7:33 AM PGY-1, [facility] Intern pager: [phone], text pages welcome

## 2019-10-08 NOTE — Evaluation (Signed)
Occupational Therapy Evaluation Patient Details Name: Lisa [name] MRN: [id number] DOB: Feb 08, 1951 Today's Date: 10/08/2019    History of Present Illness Lisa [name] is a 69 y.o. female who presents with gross hematuria and blood loss anemia in setting of known papillary carcinoma of the bladder. PMHx significant for: T1 UCC of the bladder, bilateral DVTs, CVA, HTN, HLD, T2DM.   Clinical Impression   Pt presents with diagnoses above and deficits in strength, balance, and cognition. Cognition and safety awareness is pt's primary limiting factor in safe completion of daily tasks. Pt initially very agitated and refused to participate, perseverating on avoiding activity with lines present. Pt overall Supervision for bed mobility and sit to stand transfer without AD, progressing to min guard for steadiness and safety with lines due to impulsivity. Pt requires up to [facility] for LB ADLs due to deficits. Recommend SNF for short term rehab as pt unsafe to return home at this time.     Follow Up Recommendations  SNF;Supervision/Assistance - 24 hour    Equipment Recommendations  None recommended by OT    Recommendations for Other Services       Precautions / Restrictions Precautions Precautions: Fall Precaution Comments: expressive aphasia Restrictions Weight Bearing Restrictions: No      Mobility Bed Mobility Overal bed mobility: Needs Assistance Bed Mobility: Supine to Sit;Sit to Supine     Supine to sit: Supervision;HOB elevated Sit to supine: Supervision;HOB elevated   General bed mobility comments: Supervision, no physical assist needed  Transfers Overall transfer level: Needs assistance Equipment used: None Transfers: Sit to/from Stand Sit to Stand: Supervision         General transfer comment: Supervision for sit to stand, min guard for a few steps at bedside without AD. Max cues for safety with lines    Balance Overall balance assessment: Needs  assistance Sitting-balance support: Feet supported;No upper extremity supported Sitting balance-Leahy Scale: Good     Standing balance support: No upper extremity supported;During functional activity Standing balance-Leahy Scale: Fair Standing balance comment: unsteady with dynamic balance                           ADL either performed or assessed with clinical judgement   ADL Overall ADL's : Needs assistance/impaired Eating/Feeding: Set up;Sitting   Grooming: Supervision/safety;Sitting   Upper Body Bathing: Supervision/ safety;Sitting   Lower Body Bathing: Minimal assistance;Sit to/from stand   Upper Body Dressing : Sitting;Minimal assistance   Lower Body Dressing: Minimal assistance;Sit to/from stand Lower Body Dressing Details (indicate cue type and reason): Able to don socks bed level bringing feet to self. May require assistance for donning around waist in standing or orientation of clothing  Toilet Transfer: Min guard;Stand-pivot;BSC   Toileting- Clothing Manipulation and Hygiene: Min guard;Sit to/from stand         General ADL Comments: Pt with deficits in cognition that is primary limiting factor in safe completion of ADLs/transfers. Pt with mild deficits in balance, strength     Vision Baseline Vision/History: Wears glasses Wears Glasses: At all times Patient Visual Report: No change from baseline Vision Assessment?: No apparent visual deficits     Perception     Praxis      Pertinent Vitals/Pain Pain Assessment: Faces Faces Pain Scale: No hurt Pain Intervention(s): Monitored during session     Hand Dominance Right   Extremity/Trunk Assessment Upper Extremity Assessment Upper Extremity Assessment: RUE deficits/detail RUE Deficits / Details: R UE weakness  from previous CVA   Lower Extremity Assessment Lower Extremity Assessment: Defer to PT evaluation   Cervical / Trunk Assessment Cervical / Trunk Assessment: Normal   Communication  Communication Communication: Expressive difficulties   Cognition Arousal/Alertness: Awake/alert Behavior During Therapy: Restless;Agitated;Anxious Overall Cognitive Status: History of cognitive impairments - at baseline Area of Impairment: Attention;Memory;Following commands;Safety/judgement;Awareness;Problem solving;Orientation                 Orientation Level: Person;Place;Time;Situation (unable to recall birthday, etc due to aphasia. ) Current Attention Level: Sustained Memory: Decreased short-term memory;Decreased recall of precautions Following Commands: Follows one step commands consistently;Follows multi-step commands inconsistently Safety/Judgement: Decreased awareness of safety;Decreased awareness of deficits Awareness: Emergent Problem Solving: Slow processing;Requires verbal cues;Difficulty sequencing General Comments: Pt with hx of expressive aphasia. Initially very agitated and refusing therapy, reporting she can just do it at facility. After education, pt agreeable but says she is not going far from the bed. due to lines, then attempts to walk away from bed. Talking self in circles, hyperverbose and tangential   General Comments  Initially very agitated, required max education on purpose of OT/PT orders for evaluation. Initially reported not doing anything, then not going far from bed, then attempted to walk away from bed once up    Exercises     Shoulder Instructions      Home Living Family/patient expects to be discharged to:: Private residence Living Arrangements: Spouse/significant other Available Help at Discharge: Family;Available PRN/intermittently Type of Home: House Home Access: Stairs to enter Entrance Stairs-Number of Steps: 3 Entrance Stairs-Rails: None Home Layout: Two level Alternate Level Stairs-Number of Steps: flight Alternate Level Stairs-Rails: Right Bathroom Shower/Tub: Occupational psychologist: Standard     Home Equipment:  Shower seat;Grab bars - tub/shower;Grab bars - toilet   Additional Comments: Home setup obtain from previous admission. Planned DC to SNF prior to return home as spouse has medical concerns       Prior Functioning/Environment Level of Independence: Independent        Comments: Pt reports Independence with ADLs, IADLs (cooking, laundry), and mobility without AD. Unsure of accuracy due to cognitive deficits from recent CVA        OT Problem List: Decreased strength;Impaired balance (sitting and/or standing);Impaired vision/perception;Decreased cognition;Decreased safety awareness;Decreased knowledge of use of DME or AE;Decreased knowledge of precautions;Decreased activity tolerance      OT Treatment/Interventions: Self-care/ADL training;Neuromuscular education;Therapeutic activities;Cognitive remediation/compensation;Visual/perceptual remediation/compensation;Patient/family education;Balance training;DME and/or AE instruction;Therapeutic exercise    OT Goals(Current goals can be found in the care plan section) Acute Rehab OT Goals Patient Stated Goal: go to rehab, get strength back OT Goal Formulation: With patient Time For Goal Achievement: 10/22/19 Potential to Achieve Goals: Good ADL Goals Pt Will Perform Grooming: with modified independence;standing Pt Will Perform Lower Body Bathing: with modified independence;sit to/from stand Pt Will Perform Lower Body Dressing: with modified independence;sitting/lateral leans;sit to/from stand Pt Will Transfer to Toilet: with modified independence;ambulating;regular height toilet Pt Will Perform Toileting - Clothing Manipulation and hygiene: with modified independence;sit to/from stand;sitting/lateral leans  OT Frequency: Min 2X/week   Barriers to D/C:            Co-evaluation              AM-PAC OT "6 Clicks" Daily Activity     Outcome Measure Help from another person eating meals?: A Little Help from another person taking care  of personal grooming?: A Little Help from another person toileting, which includes using toliet, bedpan, or urinal?:  A Little Help from another person bathing (including washing, rinsing, drying)?: A Little Help from another person to put on and taking off regular upper body clothing?: A Little Help from another person to put on and taking off regular lower body clothing?: A Little 6 Click Score: 18   End of Session Nurse Communication: Mobility status;Other (comment) (behavior)  Activity Tolerance: Treatment limited secondary to agitation Patient left: in bed;with call bell/phone within reach;with bed alarm set  OT Visit Diagnosis: Unsteadiness on feet (R26.81);Muscle weakness (generalized) (M62.81);Other symptoms and signs involving the nervous system (R29.898);Other symptoms and signs involving cognitive function                Time: 1412-1430 OT Time Calculation (min): 18 min Charges:  OT General Charges $OT Visit: 1 Visit OT Evaluation $OT Eval Moderate Complexity: 1 Mod  Layla Maw, OTR/L Layla Maw 10/08/2019, 2:48 PM

## 2019-10-08 NOTE — Progress Notes (Signed)
Urology Inpatient Progress Report       Intv/Subj: Patient had IVC filter placed yesterday.  CBI has been on slow drip.  No hand irrigation required.   Active Problems:   Hematuria   Lower leg DVT (deep venous thromboembolism), acute, bilateral (HCC)  Current Facility-Administered Medications  Medication Dose Route Frequency Provider Last Rate Last Admin  . 0.9 %  sodium chloride infusion (Manually program via Guardrails IV Fluids)   Intravenous Once Milus Banister C, DO      . 0.9 %  sodium chloride infusion  1,000 mL Intravenous Continuous Mullis, Kiersten P, DO 125 mL/hr at 10/08/19 1836 1,000 mL at 10/08/19 1836  . amLODipine (NORVASC) tablet 10 mg  10 mg Oral q AM Milus Banister C, DO   10 mg at 10/08/19 1152  . aspirin chewable tablet 81 mg  81 mg Oral Daily Milus Banister C, DO   81 mg at 10/08/19 0272  . atorvastatin (LIPITOR) tablet 80 mg  80 mg Oral QHS Mullis, Kiersten P, DO   80 mg at 10/07/19 2125  . cephALEXin (KEFLEX) capsule 500 mg  500 mg Oral Q12H Zola Button, MD   500 mg at 10/08/19 0826  . Chlorhexidine Gluconate Cloth 2 % PADS 6 each  6 each Topical Daily McDiarmid, Blane Ohara, MD   6 each at 10/08/19 0830  . insulin aspart (novoLOG) injection 0-9 Units  0-9 Units Subcutaneous TID WC Mullis, Kiersten P, DO      . losartan (COZAAR) tablet 25 mg  25 mg Oral q AM Milus Banister C, DO   25 mg at 10/08/19 1152  . ramelteon (ROZEREM) tablet 8 mg  8 mg Oral QHS Ezequiel Essex, MD   8 mg at 10/07/19 2347  . sodium chloride irrigation 0.9 % 3,000 mL  3,000 mL Irrigation Continuous Mullis, Kiersten P, DO   3,000 mL at 10/05/19 2200     Objective: Vital: Vitals:   10/07/19 2239 10/08/19 0647 10/08/19 0858 10/08/19 1605  BP: (!) 156/81 (!) 147/72 (!) 179/75 117/72  Pulse: 86 78 82 80  Resp: 18 18 18 18   Temp: 98.2 F (36.8 C) 98 F (36.7 C) 97.7 F (36.5 C) 98 F (36.7 C)  TempSrc: Oral  Oral Oral  SpO2: 98% 100% 99% 100%  Weight:      Height:        I/Os: I/O last 3 completed shifts: In: 12235.2 [P.O.:60; I.V.:1411.9; Other:9500; IV Piggyback:1263.2] Out: 2250 [Urine:2250]  Physical Exam:  General: Patient is in no apparent distress Lungs: Normal respiratory effort, chest expands symmetrically. GI:  The abdomen is soft  Foley: 22Fr 3way foley with CBI on very slow drip -clear yellow urine, no clots Ext: lower extremities symmetric  Lab Results: Recent Labs    10/06/19 0740 10/06/19 0740 10/06/19 2118 10/07/19 0421 10/08/19 0159  WBC 14.3*  --   --  9.7 9.8  HGB 6.6*   < > 9.7* 9.3* 9.3*  HCT 22.4*   < > 30.8* 28.7* 29.1*   < > = values in this interval not displayed.   Recent Labs    10/06/19 0740 10/07/19 0421 10/08/19 0159  NA 137 139 137  K 3.3* 3.6 3.0*  CL 107 109 104  CO2 20* 20* 18*  GLUCOSE 116* 91 104*  BUN 11 5* <5*  CREATININE 0.69 0.52 0.54  CALCIUM 7.8* 8.0* 8.1*   Recent Labs    10/06/19 0740  INR 1.8*   No results for input(s): LABURIN  in the last 72 hours. Results for orders placed or performed during the hospital encounter of 10/05/19  Culture, blood (Routine x 2)     Status: None (Preliminary result)   Collection Time: 10/05/19  4:42 PM   Specimen: BLOOD  Result Value Ref Range Status   Specimen Description BLOOD LEFT ANTECUBITAL  Final   Special Requests   Final    BOTTLES DRAWN AEROBIC AND ANAEROBIC Blood Culture adequate volume   Culture   Final    NO GROWTH 3 DAYS Performed at [facility], [place] [address], [place], [place] [address]    Report Status PENDING  Incomplete  Culture, blood (Routine x 2)     Status: None (Preliminary result)   Collection Time: 10/05/19  5:55 PM   Specimen: BLOOD  Result Value Ref Range Status   Specimen Description BLOOD LEFT ARM  Final   Special Requests   Final    BOTTLES DRAWN AEROBIC AND ANAEROBIC Blood Culture adequate volume   Culture   Final    NO GROWTH 3 DAYS Performed at [facility], [place] [address] [place], [place]  [address]    Report Status PENDING  Incomplete  Urine culture     Status: Abnormal   Collection Time: 10/06/19 12:10 AM   Specimen: Urine, Random  Result Value Ref Range Status   Specimen Description URINE, RANDOM  Final   Special Requests NONE  Final   Culture (A)  Final    <10,000 COLONIES/mL INSIGNIFICANT GROWTH Performed at [facility], [place] [address], [place], [place] [address]    Report Status 10/06/2019 FINAL  Final  SARS Coronavirus 2 by RT PCR (hospital order, performed in [facility] hospital lab) Nasopharyngeal Nasopharyngeal Swab     Status: None   Collection Time: 10/06/19 12:15 AM   Specimen: Nasopharyngeal Swab  Result Value Ref Range Status   SARS Coronavirus 2 NEGATIVE NEGATIVE Final    Comment: (NOTE) SARS-CoV-2 target nucleic acids are NOT DETECTED.  The SARS-CoV-2 RNA is generally detectable in upper and lower respiratory specimens during the acute phase of infection. The lowest concentration of SARS-CoV-2 viral copies this assay can detect is 250 copies / mL. A negative result does not preclude SARS-CoV-2 infection and should not be used as the sole basis for treatment or other patient management decisions.  A negative result may occur with improper specimen collection / handling, submission of specimen other than nasopharyngeal swab, presence of viral mutation(s) within the areas targeted by this assay, and inadequate number of viral copies (<250 copies / mL). A negative result must be combined with clinical observations, patient history, and epidemiological information.  Fact Sheet for Patients:   StrictlyIdeas.no  Fact Sheet for Healthcare Providers: BankingDealers.co.za  This test is not yet approved or  cleared by the [place] FDA and has been authorized for detection and/or diagnosis of SARS-CoV-2 by FDA under an Emergency Use Authorization (EUA).  This EUA will remain in effect (meaning this  test can be used) for the duration of the COVID-19 declaration under Section 564(b)(1) of the Act, 21 U.S.C. section 360bbb-3(b)(1), unless the authorization is terminated or revoked sooner.  Performed at [facility], [place] [address] [place], [place] [address]   MRSA PCR Screening     Status: None   Collection Time: 10/06/19  9:59 PM   Specimen: Nasal Mucosa; Nasopharyngeal  Result Value Ref Range Status   MRSA by PCR NEGATIVE NEGATIVE Final  Comment:        The GeneXpert MRSA Assay (FDA approved for NASAL specimens only), is one component of a comprehensive MRSA colonization surveillance program. It is not intended to diagnose MRSA infection nor to guide or monitor treatment for MRSA infections. Performed at [facility], [place] [address], [place], [place] [address]     Studies/Results: IR IVC FILTER PLMT / S&I Burke Keels GUID/MOD SED  Result Date: 10/07/2019 INDICATION: 69 year old female with bilateral lower extremity DVT and gross hematuria, not a candidate for anticoagulation. She has been referred for IVC filter placement EXAM: ULTRASOUND-GUIDED ACCESS RIGHT INTERNAL JUGULAR VEIN PLACEMENT OF RETRIEVABLE IVC FILTER MEDICATIONS: None. ANESTHESIA/SEDATION: 1.0 mg IV Versed; 25 mcg IV Fentanyl Moderate Sedation Time:  10 minutes The patient was continuously monitored during the procedure by the interventional radiology nurse under my direct supervision. FLUOROSCOPY TIME:  Fluoroscopy Time: 0 minutes 42 seconds (34 mGy). COMPLICATIONS: None PROCEDURE: The procedure, risks, benefits, and alternatives were explained to the patient. Specific risks discussed include bleeding, infection, contrast reaction, renal failure, IVC filter fracture, migration, iliocaval thrombus (3-4% incidence), need for further procedure, need for further surgery, pulmonary embolism, cardiopulmonary collapse, death. Questions regarding the procedure were encouraged and answered. The patient understands  and consents to the procedure. Ultrasound survey was performed with images stored and sent to PACs. The neck was prepped with chlorhexidine in a sterile fashion, and a sterile drape was applied covering the operative field. A sterile gown and sterile gloves were used for the procedure. Local anesthesia was provided with 1% Lidocaine. A micropuncture needle was used access the right internal jugular vein under ultrasound. With excellent venous blood flow returned, and an .018 micro wire was passed through the needle. Small incision was made with an 11 blade scalpel. The needle was removed, and a micropuncture sheath was placed over the wire. The inner dilator and wire were removed, and an 035 Bentson wire was advanced under fluoroscopy into the IVC. Serial dilation of the soft tissue tract was performed with an 8 [place] dilator and subsequently a 10 [place] dilator. The delivery sheath for a retrievable Bard Denali filter was passed over the Bentson wire into the IVC. The wire was removed and small contrast was used to confirm IVC location. IVC cavagram performed. Dilator was removed, and the IVC filter was then delivered, positioned below the lowest renal vein. Repeat cavagram performed, and the catheter was removed. Manual pressure was used for hemostasis. Patient tolerated the procedure well and remained hemodynamically stable throughout. No complications were encountered and no significant blood loss was encounter. IMPRESSION: Status post placement of retrievable IVC filter, transjugular Signed, Dulcy Fanny. Dellia Nims, RPVI [facility] PLAN: This IVC filter is potentially retrievable. The patient can be assessed for filter retrieval by Interventional Radiology in approximately 3-6 months. Further recommendations regarding filter retrieval, continued surveillance or declaration of device permanence, will be made at that time. Electronically Signed   By: Corrie Mckusick D.O.   On: 10/07/2019 09:59   US RENAL  Result Date: 10/06/2019 CLINICAL DATA:  Hematuria.  Patient has history of bladder tumor. EXAM: RENAL / URINARY TRACT ULTRASOUND COMPLETE COMPARISON:  Abdominopelvic CT yesterday. FINDINGS: Right Kidney: Renal measurements: 11.1 x 5.4 x 7.2 cm = volume: 223 mL. Echogenicity within normal limits. No mass or hydronephrosis visualized. Left Kidney: Renal measurements: 11.6 x 5.7 x 5.0 cm = volume: 204 mL. Stone in the left kidney on CT is not well seen  by ultrasound. Echogenicity within normal limits. No mass or hydronephrosis visualized. Bladder: Decompressed by Foley catheter not well evaluated. Other: None. IMPRESSION: 1. Left renal stone on CT yesterday is not well seen by ultrasound. 2. No obstructive uropathy. 3. Urinary bladder is decompressed by Foley catheter not well assessed. Electronically Signed   By: Keith Rake M.D.   On: 10/06/2019 20:13    Assessment: 69 yo woman with known HG T1 UCC bladder, hx of CVA and acute DVTs on anticoagulation with multiple episodes of gross hematuria with need for clot evacuation.  Patient re-presented to ED with worsening gross hematuria and clot retention.  Bedside bladder irrigation and CBI started.  Urology interventions: 09/01/19: Cysto, clot evacuation, discovered to have bladder tumor intraop, TURBT 09/22/19: Cysto, clot evacuation, re-staging TURBT 09/28/19: Cysto, clot evacuation, fulguration (no active bladder seen, no residual tumor burden) 10/06/19: Bedside clot irrigation and CBI started  Plan: -CBI clamped about 7pm tonight when I saw patient -if urine remains yellow/light pink tomorrow, a catheter plug may be placed in the inflow port (the tubing needs to be removed) -pt to be discharged with foley for 10 days and can be removed at SNF or at [facility]    Jacalyn Lefevre, MD Urology 10/08/2019, 6:51 PM

## 2019-10-08 NOTE — Hospital Course (Addendum)
Lisa [name] is a 69 y.o. female with a history of T1 UCC of the bladder, bilateral DVTs, CVA, HTN, HLD, T2DM who presented with gross hematuria and blood loss anemia in the setting of known papillary carcinoma of the bladder. 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  She was discharged to SNF, but returned due to recurrence of bladder bleed.  She had a CTA A/P which showed evidence of blood clot and prominent vessel along the bladder base with possible active extravasation into the bladder lumen.  Urology was consulted, Dr. Lovena [name] performed bedside clot irrigation with balloon tamponade with successful control of bleed.  Foley catheter was also placed with continuous bladder irrigation.  Her anticoagulation and antiplatelet agents were held given bleed, though she was continued on lower dose heparin drip given her known DVTs.  On 8/24, she received 2 units of pRBC for hemoglobin 6.6, which responded appropriately.  On 8/25, she had an IVC filter placed by interventional radiology with the intention of stopping anticoagulation given recurrent bladder bleeds.  After placement of IVC filter, her heparin drip was stopped.  Her aspirin was restarted on 8/26.  CBI was weaned off on 8/26.  Prior to discharge, her hemoglobin was stable at 9.7 and her urine remained clear.***She was discharged on aspirin alone, and her apixaban and clopidogrel were discontinued.  She was also discharged with Foley catheter with plan to maintain in place for 10 total days (to be removed 9/3).  - Restarted irrigation 8/27 night due to bright red urine. Urine became clear by AM irrigation was slowed.  CBI stopped early AM 8/30. CBI tubing disconnected PM 8/30. Urine ok, documented in notes. Now tea colored/light orange with  pink sediment.   Urology (Dr. Claudia [name]) wants Lisa [name] to be discharged WITH FOLEY IN. Needs OP uro appt with [facility] on Tues 9/7 for foley removal and void trial.   To be discharged first thing Wed AM 9/1. Will have 24h home aide, home health, and [facility] already coordinated with SW and husband, should be delivered 9/1 (she can be discharged before equipment comes if it's late or whatever).   SIRS Patient presented with leukocytosis with neutrophilic predominance, mild tachypnea, and mild tachycardia without fever.  Initially had some elevated lactate as well.  Her vital signs normalized within hours and her lactate returned to normal on the next lab draw with IV fluids.  Work-up, including CXR, CT A/P, and Covid test, was unremarkable.  She had a UA which was obtained s/p bladder irrigation, so was felt to be inaccurate.  She was initially started on IV vancomycin and IV cefepime, but vancomycin was discontinued given negative MRSA swab and primary concern for uropathogens.  After her urine culture came back negative, she was transitioned to cephalexin for empiric UTI treatment given initial leukocytosis.  IV vancomycin (8/23) IV cefepime (8/23-8/24) Cephalexin (8/25-8/27)  DVT Patient noted to have bilateral LE DVT's during last admission in setting of known papillary cancer.  There was also some concern for PE during previous admission; however, patient declined CTA imaging.  Her apixaban was held during admission.  She was started on heparin drip initially, but this was discontinued after IVC filter was placed on 8/25.  Her anticoagulation was discontinued on discharge.  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 She has known carotid disease s/p endovascular stent placement on 7/8. Carotid ultrasound during last admission notable for patency with <50% stenosis.   Upon discussion with neurology and vascular surgery, decision was made to discharge patient on aspirin alone without clopidogrel given high risk of bleeding.  HTN Patient recently presented with soft blood pressures, so her home antihypertensives were held.  As her blood pressures normalized, she was restarted on her home medications.  She was continued on her home medications on discharge.***  T2DM Patient was treated with sliding scale insulin with good control.  Other problems chronic and stable.  FOLLOW UP INSTRUCTIONS Discharge with foley in place.  OP urology appt with Dr. Claudia [name] at [facility] urology needs to be scheduled for Tues 9/7 (at which time they'll remove foley and do void trial)

## 2019-10-08 NOTE — Progress Notes (Signed)
Urology Inpatient Progress Report    Attempted to see patient however she was at IR for IVC filter.  Renal US reviewed and no residual clot burden seen.  Hgb stable after 2u transfusion.  CBI on slow drip with light red urine.  No hand irrigation documented.  Will attempt to see tomorrow.

## 2019-10-08 NOTE — Progress Notes (Addendum)
I agree with the following treatment note after review of the documentation. This session was performed under the supervision of a licensed clinician.   Lou Miner, DPT  Acute Rehabilitation Services  Pager: [phone]    10/08/19 1720  PT Visit Information  Last PT Received On 10/08/19  Assistance Needed +1  History of Present Illness Pt is a 69 y.o. female who presents with gross hematuria and blood loss anemia in setting of known papillary carcinoma of the bladder. Pt is also s/p IVC filter placement. PMHx significant for: T1 UCC of the bladder, bilateral DVTs, CVA, HTN, HLD, T2DM.  Precautions  Precautions Fall  Restrictions  Weight Bearing Restrictions No  Home Living  Family/patient expects to be discharged to: [place] other  Available Help at Discharge Family;Available PRN/intermittently  Type of Home House  Home Access Stairs to enter  Entrance Stairs-Number of Steps 3  Entrance Stairs-Rails None  Home Layout Two level  Alternate Level Stairs-Number of Steps flight  Alternate Level Stairs-Rails Right  Bathroom Shower/Tub Walk-in Cytogeneticist Yes  Home Equipment Shower seat;Grab bars - tub/shower;Grab bars - toilet  Additional Comments Home setup obtained from previous admission. Plan to move to a new SNF in [place]  Prior Function  Level of Independence Independent (Simultaneous filing. User may not have seen previous data.)  Comments Pt reports Independence with ADLs, IADLs (cooking, laundry), and mobility without AD. Unsure of accuracy due to cognitive deficits from recent CVA (Simultaneous filing. User may not have seen previous data.)  Communication  Communication Expressive difficulties  Pain Assessment  Pain Assessment Faces  Faces Pain Scale 0  Cognition  Arousal/Alertness Awake/alert  Behavior During Therapy Agitated;Impulsive  Overall Cognitive Status  History of cognitive impairments - at baseline  Area of Impairment Following commands;Safety/judgement;Awareness;Problem solving  Following Commands Follows one step commands inconsistently;Follows one step commands with increased time  Safety/Judgement Decreased awareness of safety;Decreased awareness of deficits  Awareness Emergent  Problem Solving Slow processing;Requires verbal cues;Difficulty sequencing  General Comments Pt with hx of expressive aphasia. Initially agitated and refusing therapy and was educated on why therapy is needed. Pt had some difficulty follow simple commands and required increased time to process information. Pt was impulsive with mobility and tried to get OOB prior to therapist being ready  Upper Extremity Assessment  Upper Extremity Assessment Defer to OT evaluation  Lower Extremity Assessment  Lower Extremity Assessment Generalized weakness;RLE deficits/detail;LLE deficits/detail  RLE Deficits / Details general weakness R>L limited knee extension, hip flexion, and DF  RLE Coordination decreased fine motor  LLE Deficits / Details gross weakness with limited ROM in DF, hip flexion and knee extension  LLE Coordination decreased fine motor  Cervical / Trunk Assessment  Cervical / Trunk Assessment Normal  Bed Mobility  Overal bed mobility Needs Assistance  Bed Mobility Supine to Sit;Sit to Supine  Supine to sit Supervision;HOB elevated  Sit to supine Supervision;HOB elevated  General bed mobility comments Supervision for safety   Transfers  Overall transfer level Needs assistance  Equipment used None  Transfers Sit to/from Stand  Sit to Stand Min guard  General transfer comment pt required min guard for safety with sit<>stand transfer  Ambulation/Gait  General Gait Details pt refused to walk and elected to stay at bedside (Simultaneous filing. User may not have seen previous data.)  Balance  Overall balance assessment Needs assistance  Sitting-balance  support Feet supported;No upper extremity supported  Sitting balance-Leahy Scale Good  Sitting balance - Comments pt able to sit EOB without UE support  Standing balance support No upper extremity supported;During functional activity  Standing balance-Leahy Scale Fair  Standing balance comment pt was unsteady with dynamic balance but able to various exercises with min guard assist (Simultaneous filing. User may not have seen previous data.)  Tandem Stance - Right Leg 20  Tandem Stance - Left Leg 20  High Level Balance Comments pt had 2 x LOB with standing marches. Pt able to maintain feet together standing balance for 30 seconds (Simultaneous filing. User may not have seen previous data.)  Exercises  Exercises General Lower Extremity;Other exercises  General Exercises - Lower Extremity  Ankle Circles/Pumps AROM;Both;10 reps  Long Arc Quad AROM;Both;10 reps;Seated  Hip Flexion/Marching AROM;Both;10 reps;Seated  Heel Raises AROM;Both;10 reps;Seated  Other Exercises  Other Exercises standing marches x 10 on each side  (Simultaneous filing. User may not have seen previous data.)  Other Exercises feet together standing balance with horizontal and vertical head turns x 10 each direction  PT - End of Session  Equipment Utilized During Treatment Gait belt  Activity Tolerance Patient tolerated treatment well  Patient left in bed;with call bell/phone within reach;with bed alarm set  Nurse Communication Mobility status  PT Assessment  PT Recommendation/Assessment Patient needs continued PT services  PT Visit Diagnosis Other symptoms and signs involving the nervous system (R29.898);Unsteadiness on feet (R26.81);Muscle weakness (generalized) (M62.81)  PT Problem List Decreased strength;Decreased balance;Decreased mobility;Decreased cognition;Decreased knowledge of use of DME;Decreased safety awareness;Decreased activity tolerance;Decreased knowledge of precautions;Decreased range of motion  PT Plan   PT Frequency (ACUTE ONLY) Min 3X/week (Simultaneous filing. User may not have seen previous data.)  PT Treatment/Interventions (ACUTE ONLY) Therapeutic activities;Cognitive remediation;Gait training;Therapeutic exercise;Patient/family education;Stair training;Balance training;Functional mobility training;Neuromuscular re-education  AM-PAC PT "6 Clicks" Mobility Outcome Measure (Version 2)  Help needed turning from your back to your side while in a flat bed without using bedrails? 4  Help needed moving from lying on your back to sitting on the side of a flat bed without using bedrails? 4  Help needed moving to and from a bed to a chair (including a wheelchair)? 3  Help needed standing up from a chair using your arms (e.g., wheelchair or bedside chair)? 3  Help needed to walk in hospital room? 3 (Simultaneous filing. User may not have seen previous data.)  Help needed climbing 3-5 steps with a railing?  3 (Simultaneous filing. User may not have seen previous data.)  6 Click Score 20 (Simultaneous filing. User may not have seen previous data.)  Consider Recommendation of Discharge To: Home with no services (Simultaneous filing. User may not have seen previous data.)  PT Recommendation  Follow Up Recommendations SNF;Supervision for mobility/OOB  PT equipment None recommended by PT  Individuals Consulted  Consulted and Agree with Results and Recommendations Patient  Acute Rehab PT Goals  Patient Stated Goal get to a rehab center already (Simultaneous filing. User may not have seen previous data.)  PT Goal Formulation With patient  Time For Goal Achievement 10/22/19  Potential to Achieve Goals Good  PT Time Calculation  PT Start Time (ACUTE ONLY) 1537  PT Stop Time (ACUTE ONLY) 1554  PT Time Calculation (min) (ACUTE ONLY) 17 min  PT General Charges  $$ ACUTE PT VISIT 1 Visit  PT Evaluation  $PT Eval Moderate Complexity 1 Mod  Written Expression  Dominant Hand Right    Pt was  evaluated for the above  diagnosis and impairments below. Pt required supervision to min guard assist with all mobility tasks today. Pt refusing gait this session. She was educated on standing balance exercises and general LE HEP. Pt was at [facility] place for rehab but would like to be transferred to another SNF. Pt would continue to benefit from acute therapy in order to increase her independence with functional tasks. Will continue to follow acutely.   Gloriann Loan, SPT  Acute Rehabilitation Services  Office: [phone]

## 2019-10-08 NOTE — Plan of Care (Signed)
°  Problem: Coping: °Goal: Level of anxiety will decrease °Outcome: Progressing °  °

## 2019-10-08 NOTE — NC FL2 (Signed)
[place] MEDICAID FL2 LEVEL OF CARE SCREENING TOOL     IDENTIFICATION  Patient Name: Lisa [name] Birthdate: 03-27-1950 Sex: female Admission Date (Current Location): 10/05/2019  [facility] and [place] Number:  Herbalist and Address:  [facility], [place] [address] [place], [place] [address]      Provider Number: [id number]  Attending Physician Name and Address:  McDiarmid, Blane Ohara, MD  Relative Name and Phone Number:  [name] - sister-in-law and HCPOA - [phone]    Current Level of Care: SNF Recommended Level of Care: [place] Prior Approval Number:    Date Approved/Denied:   PASRR Number: [id number]  Discharge Plan: SNF    Current Diagnoses: Patient Active Problem List   Diagnosis Date Noted  . Lower leg DVT (deep venous thromboembolism), acute, bilateral ([place])   . Acute deep vein thrombosis (DVT) of proximal vein of both lower extremities (HCC)   . Symptomatic anemia   . Weakness generalized   . Aphasia   . Palliative care by specialist   . DNR (do not resuscitate) discussion   . Primary papillary carcinoma of bladder ([place]) 09/17/2019  . Lacunar infarct, acute ([place]) 09/17/2019  . Acute blood loss anemia 09/17/2019  . Pressure injury of skin 09/07/2019  . Gross hematuria 09/01/2019  . Hematuria 08/31/2019  . 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 Stenosis of carotid artery   . Abnormality of aortic valve   . DM (diabetes mellitus), type 2, uncontrolled w/neurologic complication ([place])   . Hypercholesteremia   . CVA (cerebral vascular accident) ([place]) 08/12/2019  . Acute ischemic stroke ([place])   . Hypertension     Orientation RESPIRATION BLADDER Height & Weight     Self, Place  Normal Continent Weight: 176 lb 5.9 oz (80 kg) Height:  5\' 4"  (162.6 cm)  BEHAVIORAL SYMPTOMS/MOOD NEUROLOGICAL BOWEL NUTRITION STATUS      Incontinent Diet (Regular)   AMBULATORY STATUS COMMUNICATION OF NEEDS Skin   Limited Assist Verbally Other (Comment) (Wound right lower neck-venous access site-right IJ; Pressure injury right toe)   PU Stage 2 Dressing: No Dressing                   Personal Care Assistance Level of Assistance  Bathing, Feeding, Dressing Bathing Assistance: Limited assistance Feeding assistance: Independent Dressing Assistance: Limited assistance     Functional Limitations Info  Sight, Hearing, Speech Sight Info: Adequate Hearing Info: Adequate Speech Info: Impaired (Expressive aphasia)    SPECIAL CARE FACTORS FREQUENCY        PT Frequency: PT at SNF eval and treat, a minimum of 5 days per week OT Frequency: OT at SNF eval and treat, a minimum of 5 days per week            Contractures Contractures Info: Not present    Additional Factors Info  Code Status, Allergies, Insulin Sliding Scale Code Status Info: Full Allergies Info: No known allergies   Insulin Sliding Scale Info: 0-9 Units 3 times per day with meals       Current Medications (10/08/2019):  This is the current hospital active medication list Current Facility-Administered Medications  Medication Dose Route Frequency Provider Last Rate Last Admin  . 0.9 %  sodium chloride infusion (Manually program via Guardrails IV Fluids)   Intravenous Once Milus Banister C, DO      . 0.9 %  sodium chloride infusion  1,000 mL Intravenous Continuous  Mullis, Kiersten P, DO 125 mL/hr at 10/08/19 1153 1,000 mL at 10/08/19 1153  . amLODipine (NORVASC) tablet 10 mg  10 mg Oral q AM Milus Banister C, DO   10 mg at 10/08/19 1152  . aspirin chewable tablet 81 mg  81 mg Oral Daily Milus Banister C, DO   81 mg at 10/08/19 9201  . atorvastatin (LIPITOR) tablet 80 mg  80 mg Oral QHS Mullis, Kiersten P, DO   80 mg at 10/07/19 2125  . cephALEXin (KEFLEX) capsule 500 mg  500 mg Oral Q12H Zola Button, MD   500 mg at 10/08/19 0826  . Chlorhexidine Gluconate Cloth 2 % PADS 6  each  6 each Topical Daily McDiarmid, Blane Ohara, MD   6 each at 10/08/19 0830  . insulin aspart (novoLOG) injection 0-9 Units  0-9 Units Subcutaneous TID WC Mullis, Kiersten P, DO      . losartan (COZAAR) tablet 25 mg  25 mg Oral q AM Milus Banister C, DO   25 mg at 10/08/19 1152  . potassium chloride SA (KLOR-CON) CR tablet 40 mEq  40 mEq Oral Q6H Mullis, Kiersten P, DO   40 mEq at 10/08/19 0825  . ramelteon (ROZEREM) tablet 8 mg  8 mg Oral QHS Ezequiel Essex, MD   8 mg at 10/07/19 2347  . sodium chloride irrigation 0.9 % 3,000 mL  3,000 mL Irrigation Continuous Mullis, Kiersten P, DO   3,000 mL at 10/05/19 2200     Discharge Medications: Please see discharge summary for a list of discharge medications.  Relevant Imaging Results:  Relevant Lab Results:   Additional Information ss# [id number]  Sable Feil, LCSW

## 2019-10-08 NOTE — Progress Notes (Signed)
Received a page that patient wanted to take continuous telemetry off.  Did not feel comfortable discontinuing at this time as patient has a history of recent DVTs and bleeding.  Went to see patient to tell her do not feel comfortable discontinuing continue telemetry at this time and she was not happy about this and wanted to know why she was on it.  Explained to patient the reason and she continues to voice displeasure but decided to keep the leads on for the time being.

## 2019-10-09 LAB — CBC WITH DIFFERENTIAL/PLATELET
Abs Immature Granulocytes: 0.05 10*3/uL (ref 0.00–0.07)
Basophils Absolute: 0 10*3/uL (ref 0.0–0.1)
Basophils Relative: 0 %
Eosinophils Absolute: 0.2 10*3/uL (ref 0.0–0.5)
Eosinophils Relative: 3 %
HCT: 31.1 % — ABNORMAL LOW (ref 36.0–46.0)
Hemoglobin: 9.7 g/dL — ABNORMAL LOW (ref 12.0–15.0)
Immature Granulocytes: 1 %
Lymphocytes Relative: 23 %
Lymphs Abs: 1.6 10*3/uL (ref 0.7–4.0)
MCH: 27.3 pg (ref 26.0–34.0)
MCHC: 31.2 g/dL (ref 30.0–36.0)
MCV: 87.6 fL (ref 80.0–100.0)
Monocytes Absolute: 0.6 10*3/uL (ref 0.1–1.0)
Monocytes Relative: 9 %
Neutro Abs: 4.5 10*3/uL (ref 1.7–7.7)
Neutrophils Relative %: 64 %
Platelets: 424 10*3/uL — ABNORMAL HIGH (ref 150–400)
RBC: 3.55 MIL/uL — ABNORMAL LOW (ref 3.87–5.11)
RDW: 16.6 % — ABNORMAL HIGH (ref 11.5–15.5)
WBC: 6.9 10*3/uL (ref 4.0–10.5)
nRBC: 0 % (ref 0.0–0.2)

## 2019-10-09 LAB — BASIC METABOLIC PANEL
Anion gap: 8 (ref 5–15)
BUN: <5 mg/dL — ABNORMAL LOW (ref 8–23)
CO2: 22 mmol/L (ref 22–32)
Calcium: 8.4 mg/dL — ABNORMAL LOW (ref 8.9–10.3)
Chloride: 109 mmol/L (ref 98–111)
Creatinine, Ser: 0.37 mg/dL — ABNORMAL LOW (ref 0.44–1.00)
GFR calc Af Amer: >60 mL/min (ref >60)
GFR calc non Af Amer: >60 mL/min (ref >60)
Glucose, Bld: 98 mg/dL (ref 70–99)
Potassium: 4 mmol/L (ref 3.5–5.1)
Sodium: 139 mmol/L (ref 135–145)

## 2019-10-09 LAB — GLUCOSE, CAPILLARY
Glucose-Capillary: 96 mg/dL (ref 70–99)
Glucose-Capillary: 101 mg/dL — ABNORMAL HIGH (ref 70–99)
Glucose-Capillary: 107 mg/dL — ABNORMAL HIGH (ref 70–99)
Glucose-Capillary: 91 mg/dL (ref 70–99)

## 2019-10-09 MED ORDER — SODIUM CHLORIDE 0.9 % IR SOLN: 3000.0000 mL | Status: DC

## 2019-10-09 NOTE — Progress Notes (Signed)
Received page at 2218 regarding foley draining red. Restarted continuous bladder irrigation with the follow parameters, per urology recommendations:  - Bright red drainage - infuse irrigation solution with the roller clamp wide open until drainage appears pink or clear. If drainage does not clear, leave irrigation wide open, notify the physician, and monitor for hypovolemic shock. - Pink or tea coloured - infuse irrigation solution at a moderate rate. - Clear drainage - infuse irrigation solution at a slow rate.  Will follow up Hgb with AM CBC.   If red foley drainage persists despite CBI, will contact urology and obtain stat CBC.   Ezequiel Essex, MD

## 2019-10-09 NOTE — Progress Notes (Signed)
Family Medicine Teaching Service Daily Progress Note Intern Pager: [phone]  Patient name: Lisa [name] Medical record number: [id number] Date of birth: 1950-10-24 Age: 69 y.o. Gender: female  Primary Care Provider: Patient, No Pcp Per Consultants: Urology, IR   Code Status: Full Code  Pt Overview and Major Events to Date:  8/23 Admitted 8/24bedside clot irrigation,2u pRBC 8/25 IVC filter placement  Assessment and Plan: Lisa [name] is a 69 y.o. female who presents with gross hematuria and blood loss anemia in setting of known papillary carcinoma of the bladder. PMHx significant for:T1 UCC of the bladder, bilateral DVTs, CVA, HTN, HLD, T2DM. Now medically stable for discharge to SNF.  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  Plan to leave Foley catheter in for 10 days to allow bladder to heal.  Appreciate involvement of urology and interventional radiology.  This morning, Foley bag with clear urine off CBI since yesterday. - transfusion threshold 8 - ASA restarted - continue Foley catheter (8/24-)  SIRS Now resolved, vital signs WNL.  Though urine cultures negative, will treat empirically for UTI for 5-day total course. - cephalexin (8/25-8/27) -s/pIV cefepime (8/23-8/24) - s/p vancomycin (8/23) - IV fluids  DVT Patient was found to have bilateral LE DVTs during previous admission in the setting of known malignancy. Presumed to have PE; however, not officially diagnosed because patient declined CTA imaging.Now s/p IVC filter placement, oral anticoagulation discontinued. - apixaban discontinued - f/u IR outpatient 3-6  months for potential IVC retrieval  HTN Patient with history of hypertension;however, patient had soft blood pressures on admission with lowest 104/78. Home meds include: Losartan 25mg  QD, Metoprolol 25mg  QD, Amlodipine 10mg  QD. Goal BP: 130-150. 104/92 this morning. - continue losartan and amlodipine - holding metoprolol for now  T2DM A1C 6.5%. Home meds: Metformin 500mg  QD. Well-controlled on sliding scale insulin. 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 She has known carotid disease s/p endovascular stent placement on 7/8. Carotid ultrasound during last admission notable for patency with <50% stenosis.  Ideally would be on DAPT, but given high bleeding risk, will continue patient on aspirin only.  Discussed with neurology and vascular surgery, they are in agreement to continue ASA only and stopping clopidogrel. Home meds: ASA, Atorvastatin 40mg  QD.  - ASA restarted  - Discontinue clopidogrel given high bleeding risk -Continue atorvastatin  Uterine Mass Chronic, stable.Uterine fundal mass noted on last CT abd/pelvis on 8/5 measuring 8x12x11. CT Abd/Pelvis on admission notable for enhancing heterogenous mass off uterine fundus measuring 8.6cm consistent with a largeexophytic fibroid. Per chart review, patient to have repeat outpatient TVUS.  -outpatient TVUS  FEN/GI: regular diet PPx: SCDs  Disposition: med-tele, medically stable for SNF  Subjective:  NAOE.  CBI stopped yesterday.  Objective: Temp:  [97.9 F (36.6 C)-98.3 F (36.8 C)] 98.3 F (36.8 C) (08/27 0902) Pulse Rate:  [80-83] 82 (08/27 0902) Resp:  [18] 18 (08/27 0902) BP: (104-153)/(70-92) 104/92 (08/27 0902) SpO2:  [99 %-100 %] 100 % (08/27 0902) Physical Exam: General:Alert, elderly female lying in bed, NAD Neck:Dressing  CDI Cardiovascular:RRR, no  murmurs Respiratory:CTAB, breathing comfortably on room air Abdomen:soft, non-tender Extremities:WWP GU: foley bag with clear urine, CBI clamped  Laboratory: Recent Labs  Lab 10/06/19 0740 10/06/19 0740 10/06/19 2118 10/07/19 0421 10/08/19 0159  WBC 14.3*  --   --  9.7 9.8  HGB 6.6*   < > 9.7* 9.3* 9.3*  HCT 22.4*   < > 30.8* 28.7* 29.1*  PLT 369  --   --  330 365   < > = values in this interval not displayed.   Recent Labs  Lab 10/05/19 1641 10/05/19 1641 10/06/19 0740 10/07/19 0421 10/08/19 0159  NA 138   < > 137 139 137  K 3.2*   < > 3.3* 3.6 3.0*  CL 101   < > 107 109 104  CO2 21*   < > 20* 20* 18*  BUN 11   < > 11 5* <5*  CREATININE 1.22*   < > 0.69 0.52 0.54  CALCIUM 8.8*   < > 7.8* 8.0* 8.1*  PROT 5.8*  --   --   --   --   BILITOT 0.6  --   --   --   --   ALKPHOS 90  --   --   --   --   ALT 16  --   --   --   --   AST 21  --   --   --   --   GLUCOSE 261*   < > 116* 91 104*   < > = values in this interval not displayed.    Imaging/Diagnostic Tests: No new imaging.  Zola Button, MD 10/09/2019, 9:22 AM PGY-1, [place] Intern pager: [phone], text pages welcome

## 2019-10-09 NOTE — TOC Progression Note (Signed)
Transition of Care [facility]) - Progression Note    Patient Details  Name: Lisa [name] MRN: [id number] Date of Birth: [birth year]  Transition of [facility]) CM/SW Contact  Sharlet Salina Mila Homer, LCSW Phone Number: 10/09/2019, 5:58 PM  Clinical Narrative:  CSW received a call from Elyse Hsu, admissions director at [facility] regarding patient. CSW advised that they have declined patient going to their ALF facility while waiting for SNF as patient really needs SNF and they don't have the nursing staff to provide the care patient will need in ALF, especially at night. This information communicated to patient and sister-in-law (POA) Lisa [name].  Facility search initiated at the [facility] request, as [facility] has no rehab beds now and none anticipated next week. POA was contacted and provided with facility responses: [place] and [place].   CSW contacted [name], Engineer, site at [facility] regarding patient and she asked if patient currently getting cancer treatments. CSW visited with patient and informed her about the SNF facility search and asked her about any current cancer treatments. [name] responded that she is not currently getting any cancer treatments and is not going to any nursing facility other than [facility] for rehab and she will discharge home since [facility] does not have any beds.   Ms.[name] [name] contacted and informed regarding patient's adamant refusal to discharge to any other facility, other than [facility]. Ms. Lenny Pastel indicated that her husband is looking into some things in order for patient to go home and she will call him.       Expected Discharge Plan: [place] Barriers to Discharge: Continued Medical Work up, SNF Pending bed offer (Initiated facility search 8/26)  Expected Discharge Plan and Services Expected Discharge Plan: [place] D/C plan is home In-house Referral: Clinical Social Work     Living  arrangements for the past 2 months: [place], [place] (Patient came back to hospital from SNF (was there a few days) and prior to this was in hospital. Originally from home) Expected Discharge Date: 10/09/19                                   Social Determinants of Health (SDOH) Interventions  No SDOH interventions requested or needed at this time  Readmission Risk Interventions No flowsheet data found.

## 2019-10-10 LAB — CBC
HCT: 33.4 % — ABNORMAL LOW (ref 36.0–46.0)
Hemoglobin: 10.5 g/dL — ABNORMAL LOW (ref 12.0–15.0)
MCH: 27.5 pg (ref 26.0–34.0)
MCHC: 31.4 g/dL (ref 30.0–36.0)
MCV: 87.4 fL (ref 80.0–100.0)
Platelets: 504 10*3/uL — ABNORMAL HIGH (ref 150–400)
RBC: 3.82 MIL/uL — ABNORMAL LOW (ref 3.87–5.11)
RDW: 16.6 % — ABNORMAL HIGH (ref 11.5–15.5)
WBC: 8.5 10*3/uL (ref 4.0–10.5)
nRBC: 0 % (ref 0.0–0.2)

## 2019-10-10 LAB — BASIC METABOLIC PANEL
Anion gap: 10 (ref 5–15)
BUN: <5 mg/dL — ABNORMAL LOW (ref 8–23)
CO2: 23 mmol/L (ref 22–32)
Calcium: 8.7 mg/dL — ABNORMAL LOW (ref 8.9–10.3)
Chloride: 104 mmol/L (ref 98–111)
Creatinine, Ser: 0.48 mg/dL (ref 0.44–1.00)
GFR calc Af Amer: >60 mL/min (ref >60)
GFR calc non Af Amer: >60 mL/min (ref >60)
Glucose, Bld: 91 mg/dL (ref 70–99)
Potassium: 3.4 mmol/L — ABNORMAL LOW (ref 3.5–5.1)
Sodium: 137 mmol/L (ref 135–145)

## 2019-10-10 LAB — GLUCOSE, CAPILLARY
Glucose-Capillary: 94 mg/dL (ref 70–99)
Glucose-Capillary: 96 mg/dL (ref 70–99)
Glucose-Capillary: 154 mg/dL — ABNORMAL HIGH (ref 70–99)
Glucose-Capillary: 123 mg/dL — ABNORMAL HIGH (ref 70–99)

## 2019-10-10 LAB — CULTURE, BLOOD (ROUTINE X 2)
Culture: NO GROWTH
Culture: NO GROWTH
Special Requests: ADEQUATE
Special Requests: ADEQUATE

## 2019-10-10 NOTE — Progress Notes (Signed)
Pt just took off telemetry and stated she was "not wearing it tonight and that she would put it back on in the am if she had to." RN informed pt that the MD wanted staff to monitor her heart rate and rhythm and pt still refused. Pt also stated that she does not want to be disturbed all night with vitals or lab work. She stated that she needed to rest and not be woken up.   Eleanora Neighbor, RN

## 2019-10-10 NOTE — Progress Notes (Signed)
Family Medicine Teaching Service Daily Progress Note Intern Pager: [phone]  Patient name: Lisa [name] Medical record number: [id number] Date of birth: [birth year] Age: 69 y.o. Gender: female  Primary Care Provider: Patient, No Pcp Per Consultants: Urology, IR Code Status: FULL  Pt Overview and Major Events to Date:  8/23 Admitted 8/24bedside clot irrigation,2u pRBC 8/25 IVC filter placement  Assessment and Plan: Lisa [name] is a 69 y.o. female who presents with gross hematuria and blood loss anemia in setting of known papillary [name] of the bladder. PMHx significant for:T1 UCC of the bladder, bilateral DVTs, Lisa [name], HTN, HLD, T2DM. Now medically stable for discharge to SNF.  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  Recurrence of bleeding again overnight on 8/27 and CBI was restarted.  Nurse reports that CBI was turned off before night shift.  Urology contacted and noted nothing else to add aside from CBI parameters.  This AM tea-colored urine, asked nurse to restart per below.  Hgb has not been collected yet this AM.   -Bright red drainage - infuse irrigation solution with the roller clamp wide openuntil drainage appears pink or clear. If drainage does not clear, leave irrigation wide open, notify the physician, and monitor for hypovolemic shock. -Pink or tea coloured -infuse irrigation solution at a moderate rate. -Clear drainage -infuse irrigation solution at a slow rate. - transfusion threshold 8 -ASA restarted - continue Foley catheter (8/24-)  SIRS- resolved Now resolved, vital signs WNL, did not wear tele overnight and refused vitals checks.  Urine cx negative, treated for UTI empirically x5d. - cephalexin (8/25-8/28) -s/pIV cefepime (8/23-8/24) - s/p vancomycin (8/23) - can d/c telemetry  given stable Hgb for comfort  DVT Patient was found to have bilateral LE DVTs during previous admission in the setting of known malignancy. Presumed to have PE; however, not officially diagnosed because patient declined CTA imaging.Now s/p IVC filter placement,oral anticoagulation discontinued given bleeding from bladder. -apixaban discontinued - f/u IR outpatient 3-6 months for potential IVC retrieval  HTN BP this AM not yet performed at patient's request to allow to rest. Patient with history of hypertension.. Home meds include: Losartan 25mg  QD, Metoprolol 25mg  QD, Amlodipine 10mg  QD. Goal BP: 130-150.   -continue losartan and amlodipine - holding metoprolol for now  T2DM A1C 6.5%. Home meds: Metformin 500mg  QD.  CBGs this AM not obtained, last from 2043 overnight was 123. Well-controlled on sliding scale insulin. 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 She has known carotid disease s/p endovascular stent placement on 7/8. Carotid ultrasound during last admission notable for patency with <50% stenosis.Ideally would be on DAPT, but given high bleeding risk, will continue patient on aspirin only. Discussed with neurology and vascular surgery, they are in agreement to continue ASA only and stopping clopidogrel. Home meds: ASA, Atorvastatin 40mg  QD.  -cont ASA  - holding clopidogrel given high bleeding risk -Continue atorvastatin  Uterine Mass Chronic, stable.Uterine fundal mass noted on last CT abd/pelvis on 8/5 measuring 8x12x11. CT Abd/Pelvis on admission notable for enhancing heterogenous mass off uterine fundus measuring 8.6cm consistent with a largeexophytic fibroid. Per chart review, patient to have repeat outpatient TVUS.  -outpatient TVUS  FEN/GI:regular diet HRC:BULA  Disposition: possibly home as  she refuses to go to any facility other than [facility] and they do not have beds available, please see CSW note from 8/27  Subjective:  Denies complaints this AM.  Took off tele overnight and refused vitals and labs until the morning to be able to rest.  Otherwise, feeling well.  Objective: Temp:  [98.1 F (36.7 C)-98.6 F (37 C)] 98.4 F (36.9 C) (08/28 2045) Pulse Rate:  [78-89] 89 (08/28 2045) Resp:  [17-18] 17 (08/28 2045) BP: (108-156)/(52-71) 108/52 (08/28 2045) SpO2:  [97 %-99 %] 97 % (08/28 2045)  Physical Exam:  General: 69 y.o. female in NAD Cardio: RRR  Lungs: CTAB, no wheezing, no rhonchi, no crackles, no IWOB on RA Abdomen: Soft, non-tender to palpation, non-distended, positive bowel sounds Skin: warm and dry Extremities: No edema   Laboratory: Recent Labs  Lab 10/08/19 0159 10/09/19 0810 10/10/19 0900  WBC 9.8 6.9 8.5  HGB 9.3* 9.7* 10.5*  HCT 29.1* 31.1* 33.4*  PLT 365 424* 504*   Recent Labs  Lab 10/05/19 1641 10/06/19 0740 10/08/19 0159 10/09/19 0810 10/10/19 0417  NA 138   < > 137 139 137  K 3.2*   < > 3.0* 4.0 3.4*  CL 101   < > 104 109 104  CO2 21*   < > 18* 22 23  BUN 11   < > <5* <5* <5*  CREATININE 1.22*   < > 0.54 0.37* 0.48  CALCIUM 8.8*   < > 8.1* 8.4* 8.7*  PROT 5.8*  --   --   --   --   BILITOT 0.6  --   --   --   --   ALKPHOS 90  --   --   --   --   ALT 16  --   --   --   --   AST 21  --   --   --   --   GLUCOSE 261*   < > 104* 98 91   < > = values in this interval not displayed.      Imaging/Diagnostic Tests: No results found.  [facility], DO 10/11/2019, 5:38 AM PGY-3, [place] Intern pager: [phone], text pages welcome

## 2019-10-10 NOTE — Progress Notes (Signed)
Family Medicine Teaching Service Daily Progress Note Intern Pager: [phone]  Patient name: Lisa [name] Medical record number: [id number] Date of birth: 1950/05/05 Age: 69 y.o. Gender: female  Primary Care Provider: Patient, No Pcp Per Consultants: Urology, IR Code Status: FULL  Pt Overview and Major Events to Date:  8/23 Admitted 8/24bedside clot irrigation,2u pRBC 8/25 IVC filter placement  Assessment and Plan: Lisa [name] is a 69 y.o. female who presents with gross hematuria and blood loss anemia in setting of known papillary carcinoma of the bladder. PMHx significant for:T1 UCC of the bladder, bilateral DVTs, CVA, HTN, HLD, T2DM. Now medically stable for discharge to SNF.  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  Plan to leave Foley catheter in for 10 days to allow bladder to heal.  Appreciate involvement of urology and interventional radiology.  Overnight patient began having bright red urine. CBI was restarted (8/27). this AM urine is clear.  Per nursing irrigation is moderate to slow.  We will continue to slow down rate.  CBI parameters are below. -Bright red drainage - infuse irrigation solution with the roller clamp wide openuntil drainage appears pink or clear. If drainage does not clear, leave irrigation wide open, notify the physician, and monitor for hypovolemic shock. -Pink or tea coloured -infuse irrigation solution at a moderate rate. -Clear drainage -infuse irrigation solution at a slow rate. - transfusion threshold 8 -ASA restarted - continue Foley catheter (8/24-)  SIRS- resolved Now resolved, vital  signs WNL.  Though urine cultures negative, will treat empirically for UTI for 5-day total course. - cephalexin (8/25-8/28) -s/pIV cefepime (8/23-8/24) - s/p vancomycin (8/23) - IV fluids  DVT Patient was found to have bilateral LE DVTs during previous admission in the setting of known malignancy. Presumed to have PE; however, not officially diagnosed because patient declined CTA imaging.Now s/p IVC filter placement,oral anticoagulation discontinued. -apixaban discontinued - f/u IR outpatient 3-6 months for potential IVC retrieval  HTN BP this AM 156/70.  Patient with history of hypertension.. Home meds include: Losartan 25mg  QD, Metoprolol 25mg  QD, Amlodipine 10mg  QD. Goal BP: 130-150.  HR this a.m. 78. -continue losartan and amlodipine - holding metoprolol for now  T2DM A1C 6.5%. Home meds: Metformin 500mg  QD.  CBG this a.m. 91. Well-controlled on sliding scale insulin. 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 She has known carotid disease s/p endovascular stent placement on 7/8. Carotid ultrasound during last admission notable for patency with <50% stenosis.Ideally would be on DAPT, but given high bleeding risk, will continue patient on aspirin only. Discussed with neurology and vascular surgery, they are in agreement to continue ASA only and stopping clopidogrel. Home meds: ASA, Atorvastatin 40mg  QD.  -ASA restarted -Discontinue clopidogrel given high bleeding risk -Continue atorvastatin  Uterine Mass Chronic, stable.Uterine fundal mass noted on last CT abd/pelvis on 8/5 measuring 8x12x11. CT Abd/Pelvis on admission notable for enhancing heterogenous mass off uterine fundus measuring 8.6cm consistent with a largeexophytic fibroid. Per chart review, patient to have repeat outpatient TVUS.  -outpatient  TVUS  FEN/GI:regular diet ZES:PQZR  Disposition: med-tele,  Subjective:  Overnight patient began having blood in urine. CBI was restarted.  Patient pleasant this a.m.  She she realizes that she had bleeding overnight and the irrigation had to be restarted.  She is looking forward to stopping irrigation and is happy to know that her urine has become clear.  She hopes that she will be able to leave by Tuesday 8/31.  Patient reports no chest pain, S OB, abdominal pain this a.m.  Objective: Temp:  [98.2 F (36.8 C)-98.5 F (36.9 C)] 98.5 F (36.9 C) (08/28 0528) Pulse Rate:  [82-91] 91 (08/28 0528) Resp:  [18] 18 (08/28 0528) BP: (104-154)/(77-92) 154/83 (08/28 0528) SpO2:  [97 %-100 %] 98 % (08/28 0528) Physical Exam: General: NAD, arousable to verbal stimuli, pleasant mood this a.m., conversant Cardiovascular: RRR, no murmur detected Respiratory: Clear and equal bilaterally, good chest expansion Abdomen: No pain to palpation, and normal active bowel sounds Extremities: Distal pulses intact, no edema noted in BLE  Laboratory: Recent Labs  Lab 10/07/19 0421 10/08/19 0159 10/09/19 0810  WBC 9.7 9.8 6.9  HGB 9.3* 9.3* 9.7*  HCT 28.7* 29.1* 31.1*  PLT 330 365 424*   Recent Labs  Lab 10/05/19 1641 10/06/19 0740 10/08/19 0159 10/09/19 0810 10/10/19 0417  NA 138   < > 137 139 137  K 3.2*   < > 3.0* 4.0 3.4*  CL 101   < > 104 109 104  CO2 21*   < > 18* 22 23  BUN 11   < > <5* <5* <5*  CREATININE 1.22*   < > 0.54 0.37* 0.48  CALCIUM 8.8*   < > 8.1* 8.4* 8.7*  PROT 5.8*  --   --   --   --   BILITOT 0.6  --   --   --   --   ALKPHOS 90  --   --   --   --   ALT 16  --   --   --   --   AST 21  --   --   --   --   GLUCOSE 261*   < > 104* 98 91   < > = values in this interval not displayed.      Imaging/Diagnostic Tests: No results found.  Freida Busman, MD 10/10/2019, 7:24 AM PGY-1, [place] Intern pager: [phone], text pages  welcome

## 2019-10-11 LAB — BASIC METABOLIC PANEL
Anion gap: 11 (ref 5–15)
BUN: 7 mg/dL — ABNORMAL LOW (ref 8–23)
CO2: 26 mmol/L (ref 22–32)
Calcium: 9 mg/dL (ref 8.9–10.3)
Chloride: 102 mmol/L (ref 98–111)
Creatinine, Ser: 0.52 mg/dL (ref 0.44–1.00)
GFR calc Af Amer: >60 mL/min (ref >60)
GFR calc non Af Amer: >60 mL/min (ref >60)
Glucose, Bld: 120 mg/dL — ABNORMAL HIGH (ref 70–99)
Potassium: 3.7 mmol/L (ref 3.5–5.1)
Sodium: 139 mmol/L (ref 135–145)

## 2019-10-11 LAB — CBC
HCT: 34.6 % — ABNORMAL LOW (ref 36.0–46.0)
Hemoglobin: 10.9 g/dL — ABNORMAL LOW (ref 12.0–15.0)
MCH: 27.6 pg (ref 26.0–34.0)
MCHC: 31.5 g/dL (ref 30.0–36.0)
MCV: 87.6 fL (ref 80.0–100.0)
Platelets: 542 10*3/uL — ABNORMAL HIGH (ref 150–400)
RBC: 3.95 MIL/uL (ref 3.87–5.11)
RDW: 16.6 % — ABNORMAL HIGH (ref 11.5–15.5)
WBC: 9.2 10*3/uL (ref 4.0–10.5)
nRBC: 0 % (ref 0.0–0.2)

## 2019-10-11 LAB — GLUCOSE, CAPILLARY
Glucose-Capillary: 142 mg/dL — ABNORMAL HIGH (ref 70–99)
Glucose-Capillary: 153 mg/dL — ABNORMAL HIGH (ref 70–99)
Glucose-Capillary: 136 mg/dL — ABNORMAL HIGH (ref 70–99)

## 2019-10-11 NOTE — Progress Notes (Signed)
Subjective: CC: Gross Hematuria.  Hx: I was called yesterday regarding recurrent bright red blood in urine.   CBI was resumed as per prior instructions.  The urine this morning is clear.  ROS:  Review of Systems  Unable to perform ROS: Other    Anti-infectives: Anti-infectives (From admission, onward)   Start     Dose/Rate Route Frequency Ordered Stop   10/07/19 2000  cephALEXin (KEFLEX) capsule 500 mg        500 mg Oral Every 12 hours 10/07/19 1201 10/10/19 0908   10/06/19 0800  vancomycin (VANCOREADY) IVPB 750 mg/150 mL  Status:  Discontinued        750 mg 150 mL/hr over 60 Minutes Intravenous Every 12 hours 10/05/19 1928 10/06/19 0634   10/05/19 1930  ceFEPIme (MAXIPIME) 2 g in sodium chloride 0.9 % 100 mL IVPB  Status:  Discontinued        2 g 200 mL/hr over 30 Minutes Intravenous  Once 10/05/19 1922 10/05/19 1923   10/05/19 1930  ceFEPIme (MAXIPIME) 2 g in sodium chloride 0.9 % 100 mL IVPB  Status:  Discontinued        2 g 200 mL/hr over 30 Minutes Intravenous Every 12 hours 10/05/19 1923 10/07/19 1201   10/05/19 1930  vancomycin (VANCOREADY) IVPB 2000 mg/400 mL        2,000 mg 200 mL/hr over 120 Minutes Intravenous  Once 10/05/19 1925 10/05/19 2351      Current Facility-Administered Medications  Medication Dose Route Frequency Provider Last Rate Last Admin  . amLODipine (NORVASC) tablet 10 mg  10 mg Oral q AM Milus Banister C, DO   10 mg at 10/10/19 0909  . aspirin chewable tablet 81 mg  81 mg Oral Daily Milus Banister C, DO   81 mg at 10/10/19 0908  . atorvastatin (LIPITOR) tablet 80 mg  80 mg Oral QHS Mullis, Kiersten P, DO   80 mg at 10/10/19 2126  . Chlorhexidine Gluconate Cloth 2 % PADS 6 each  6 each Topical Daily McDiarmid, Blane Ohara, MD   6 each at 10/10/19 0911  . insulin aspart (novoLOG) injection 0-9 Units  0-9 Units Subcutaneous TID WC Mullis, Kiersten P, DO   2 Units at 10/10/19 1712  . losartan (COZAAR) tablet 25 mg  25 mg Oral q AM Milus Banister C, DO    25 mg at 10/10/19 0910  . ramelteon (ROZEREM) tablet 8 mg  8 mg Oral QHS Ezequiel Essex, MD   8 mg at 10/10/19 2126  . sodium chloride irrigation 0.9 % 3,000 mL  3,000 mL Irrigation Continuous Ezequiel Essex, MD         Objective: Vital signs in last 24 hours: Temp:  [98.1 F (36.7 C)-98.6 F (37 C)] 98.4 F (36.9 C) (08/28 2045) Pulse Rate:  [78-89] 89 (08/28 2045) Resp:  [17-18] 17 (08/28 2045) BP: (108-156)/(52-71) 108/52 (08/28 2045) SpO2:  [97 %-99 %] 97 % (08/28 2045)  Intake/Output from previous day: 08/28 0701 - 08/29 0700 In: 480 [P.O.:480] Out: 1800 [Urine:1800] Intake/Output this shift: Total I/O In: 120 [P.O.:120] Out: 400 [Urine:400]   Physical Exam Vitals reviewed.     Lab Results:  Recent Labs    10/09/19 0810 10/10/19 0900  WBC 6.9 8.5  HGB 9.7* 10.5*  HCT 31.1* 33.4*  PLT 424* 504*   BMET Recent Labs    10/09/19 0810 10/10/19 0417  NA 139 137  K 4.0 3.4*  CL 109 104  CO2 22 23  GLUCOSE 98 91  BUN <5* <5*  CREATININE 0.37* 0.48  CALCIUM 8.4* 8.7*   PT/INR No results for input(s): LABPROT, INR in the last 72 hours. ABG No results for input(s): PHART, HCO3 in the last 72 hours.  Invalid input(s): PCO2, PO2  Studies/Results: No results found.   Assessment and Plan: Recurrent Gross hematuria.    The urine has cleared back up today.  Continue current plan.       LOS: 5 days    Lisa [name] 10/11/2019 [phone] ID: Lisa [name], female   DOB: [birth year], 69 y.o.   MRN: [id number]

## 2019-10-11 NOTE — Progress Notes (Deleted)
Cardiology Office Note:    Date:  10/11/2019   ID:  Lisa [name], DOB 12/09/1950, MRN [id number]  PCP:  Patient, No Pcp Per  Cardiologist:  Donato Heinz, MD  Electrophysiologist:  None   Referring MD: No ref. provider found   No chief complaint on file. ***  History of Present Illness:    Lisa [name] is a 69 y.o. female with a hx of CVA, carotid stenosis status post endovascular stent placement 08/20/2019, hypertension, hyperlipidemia, diabetes, bilateral DVTs, bladder cancer who presents for follow-up.  She was recently diagnosed with bladder cancer and underwent TURBT on 09/28/19.  Has had recurrent issues of hematuria requiring hospitalization.  Also found to have bilateral lower extremity DVTs during recent hospitalization, started on Eliquis.  Given recurrent issues of hematuria, Eliquis was discontinued and she underwent IVC filter placement.  She was admitted for left MCA infarct 6/30 and then lacunar stroke 8/5.  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  TEE was recommended.  Echo otherwise showed normal biventricular function, grade 1 diastolic dysfunction, no significant valvular disease.  Past Medical History:  Diagnosis Date  . Carotid arterial disease ([place]) 07/2019  . Diabetes ([place])   . HTN (hypertension)   . Hyperlipidemia   . Stroke [facility])    acute/subacute left MCA infarct 08/12/19    Past Surgical History:  Procedure Laterality Date  . CYSTOSCOPY WITH FULGERATION N/A 09/01/2019   Procedure: CYSTOSCOPY CLOT EVACUATION WITH POSSIBLE FULGERATION;  Surgeon: Robley Fries, MD;  Location: [place];  Service: Urology;  Laterality: N/A;  . CYSTOSCOPY WITH FULGERATION N/A 09/28/2019   Procedure: [place] AND CLOT EVACUATION;  Surgeon: Robley Fries, MD;  Location: [place];  Service: Urology;   Laterality: N/A;  . IR IVC FILTER PLMT / S&I [facility] GUID/MOD SED  10/07/2019  . TONSILLECTOMY    . TRANSCAROTID ARTERY REVASCULARIZATION Left 08/20/2019   Procedure: LEFT TRANSCAROTID ARTERY REVASCULARIZATION;  Surgeon: Waynetta Sandy, MD;  Location: [place];  Service: Vascular;  Laterality: Left;  . TRANSURETHRAL RESECTION OF BLADDER TUMOR WITH MITOMYCIN-C N/A 09/22/2019   Procedure: TRANSURETHRAL RESECTION OF BLADDER TUMOR WITH INTRAVESICAL GEMCITABINE;  Surgeon: Robley Fries, MD;  Location: [facility];  Service: Urology;  Laterality: N/A;  90 MINS    Current Medications: No outpatient medications have been marked as taking for the 10/14/19 encounter (Appointment) with Donato Heinz, MD.     Allergies:   Patient has no known allergies.   Social History   Socioeconomic History  . Marital status: Married    Spouse name: Not on file  . Number of children: Not on file  . Years of education: Not on file  . Highest education level: Not on file  Occupational History  . Not on file  Tobacco Use  . Smoking status: Former Smoker    Packs/day: 0.50    Types: Cigarettes    Quit date: 07/28/2019    Years since quitting: 0.2  . Smokeless tobacco: Never Used  Vaping Use  . Vaping Use: Never used  Substance and Sexual Activity  . Alcohol use: Not Currently  . Drug use: Never  . Sexual activity: Yes  Other Topics Concern  . Not on file  Social History Narrative  . Not on file   Social Determinants of Health   Financial Resource Strain:   . Difficulty of Paying  Living Expenses: Not on file  Food Insecurity:   . Worried About Charity fundraiser in the Last Year: Not on file  . Ran Out of Food in the Last Year: Not on file  Transportation Needs:   . Lack of Transportation (Medical): Not on file  . Lack of Transportation (Non-Medical): Not on file  Physical Activity:   . Days of Exercise per Week: Not on file  . Minutes of Exercise per Session: Not on file  Stress:    . Feeling of Stress : Not on file  Social Connections:   . Frequency of Communication with Friends and Family: Not on file  . Frequency of Social Gatherings with Friends and Family: Not on file  . Attends Religious Services: Not on file  . Active Member of Clubs or Organizations: Not on file  . Attends Archivist Meetings: Not on file  . Marital Status: Not on file     Family History: The patient's ***family history includes Hypertension in her mother; Lung cancer in her father.  ROS:   Please see the history of present illness.    *** All other systems reviewed and are negative.  EKGs/Labs/Other Studies Reviewed:    The following studies were reviewed today: ***  EKG:  EKG is *** ordered today.  The ekg ordered today demonstrates ***  Recent Labs: 09/02/2019: Magnesium 1.5 10/05/2019: ALT 16 10/11/2019: BUN 7; Creatinine, Ser 0.52; Hemoglobin 10.9; Platelets 542; Potassium 3.7; Sodium 139  Recent Lipid Panel    Component Value Date/Time   CHOL 94 09/18/2019 0421   TRIG 82 09/18/2019 0421   HDL 32 (L) 09/18/2019 0421   CHOLHDL 2.9 09/18/2019 0421   VLDL 16 09/18/2019 0421   LDLCALC 46 09/18/2019 0421    Physical Exam:    VS:  There were no vitals taken for this visit.    Wt Readings from Last 3 Encounters:  10/06/19 176 lb 5.9 oz (80 kg)  09/27/19 177 lb 7.5 oz (80.5 kg)  09/05/19 179 lb 2 oz (81.3 kg)     GEN: *** Well nourished, well developed in no acute distress HEENT: Normal NECK: No JVD; No carotid bruits LYMPHATICS: No lymphadenopathy CARDIAC: ***RRR, no murmurs, rubs, gallops RESPIRATORY:  Clear to auscultation without rales, wheezing or rhonchi  ABDOMEN: Soft, non-tender, non-distended MUSCULOSKELETAL:  No edema; No deformity  SKIN: Warm and dry NEUROLOGIC:  Alert and oriented x 3 PSYCHIATRIC:  Normal affect   ASSESSMENT:    No diagnosis found. 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  TEE was recommended.  CVA: Left MCA stroke 08/12/2019 and lacunar stroke 09/17/2019.  Has carotid stenosis status post endovascular stent 08/20/2019.  On aspirin only.  Plavix held given high bleeding risk.  DVT: Bilateral lower extremity DVTs found during recent admission.  Endocrine bleeding on Eliquis.  Anticoagulation has been discontinued and IVC filter placed.  Has follow-up with IR for filter removal in 3 to 6 months.  Hypertension: On losartan and amlodipine  T2DM: A1c 6.5%.   Medication Adjustments/Labs and Tests Ordered: Current medicines are reviewed at length with the patient today.  Concerns regarding medicines are outlined above.  No orders of the defined types were placed in this encounter.  No orders of the defined types were placed in this encounter.   There are no Patient  Instructions on file for this visit.   Signed, Donato Heinz, MD  10/11/2019 5:44 PM    [place]

## 2019-10-12 LAB — GLUCOSE, CAPILLARY
Glucose-Capillary: 172 mg/dL — ABNORMAL HIGH (ref 70–99)
Glucose-Capillary: 173 mg/dL — ABNORMAL HIGH (ref 70–99)
Glucose-Capillary: 132 mg/dL — ABNORMAL HIGH (ref 70–99)
Glucose-Capillary: 138 mg/dL — ABNORMAL HIGH (ref 70–99)

## 2019-10-12 MED ORDER — ENSURE MAX PROTEIN PO LIQD
11.0000 [oz_av] | Freq: Every day | ORAL | Status: DC
  Administered 2019-10-12 – 2019-10-13 (×2): 11 [oz_av] via ORAL
  Filled 2019-10-12 (×4): qty 330

## 2019-10-12 NOTE — Progress Notes (Signed)
Urology Inpatient Progress Report      Intv/Subj: Patient developed worsening gross hematuria over the weekend and CBI was restarted.  Urology was called this morning to evaluate her urine prior to being discharged to skilled nursing facility.  In the interim, patient CBI has been weaned and actually turned off.  The inflow port has been capped.  Urine has improved and is clear.  Active Problems:   Hematuria   Lower leg DVT (deep venous thromboembolism), acute, bilateral (HCC)  Current Facility-Administered Medications  Medication Dose Route Frequency Provider Last Rate Last Admin  . amLODipine (NORVASC) tablet 10 mg  10 mg Oral q AM Milus Banister C, DO   10 mg at 10/12/19 4580  . aspirin chewable tablet 81 mg  81 mg Oral Daily Milus Banister C, DO   81 mg at 10/12/19 9983  . atorvastatin (LIPITOR) tablet 80 mg  80 mg Oral QHS Mullis, Kiersten P, DO   80 mg at 10/11/19 2203  . Chlorhexidine Gluconate Cloth 2 % PADS 6 each  6 each Topical Daily McDiarmid, Blane Ohara, MD   6 each at 10/12/19 [phone]  . insulin aspart (novoLOG) injection 0-9 Units  0-9 Units Subcutaneous TID WC Mullis, Kiersten P, DO   2 Units at 10/12/19 1350  . losartan (COZAAR) tablet 25 mg  25 mg Oral q AM Milus Banister C, DO   25 mg at 10/12/19 0539  . protein supplement (ENSURE MAX) liquid  11 oz Oral Daily Ezequiel Essex, MD      . ramelteon (ROZEREM) tablet 8 mg  8 mg Oral QHS Ezequiel Essex, MD   8 mg at 10/11/19 2203  . sodium chloride irrigation 0.9 % 3,000 mL  3,000 mL Irrigation Continuous Ezequiel Essex, MD         Objective: Vital: Vitals:   10/11/19 0926 10/11/19 1652 10/11/19 2017 10/12/19 0932  BP: 133/74 123/71 139/78 136/63  Pulse: 85 82 88 87  Resp: 18 18 18 18   Temp: 98 F (36.7 C) 98.8 F (37.1 C) 98.6 F (37 C) 97.9 F (36.6 C)  TempSrc: Oral  Oral Oral  SpO2: 99% 99% 97% 97%  Weight:      Height:       I/Os: I/O last 3 completed shifts: In: 600 [P.O.:600] Out: 2400  [Urine:2400]  Physical Exam:  General: Patient is in no apparent distress Lungs: Normal respiratory effort, chest expands symmetrically. GI: The abdomen is soft and nontender  Foley: 22 French three-way Foley with inflow port capped, urine is clear, yellow without clot in tubing Ext: lower extremities symmetric  Lab Results: Recent Labs    10/10/19 0900 10/11/19 0858  WBC 8.5 9.2  HGB 10.5* 10.9*  HCT 33.4* 34.6*   Recent Labs    10/10/19 0417 10/11/19 0858  NA 137 139  K 3.4* 3.7  CL 104 102  CO2 23 26  GLUCOSE 91 120*  BUN <5* 7*  CREATININE 0.48 0.52  CALCIUM 8.7* 9.0   No results for input(s): LABPT, INR in the last 72 hours. No results for input(s): LABURIN in the last 72 hours. Results for orders placed or performed during the hospital encounter of 10/05/19  Culture, blood (Routine x 2)     Status: None   Collection Time: 10/05/19  4:42 PM   Specimen: BLOOD  Result Value Ref Range Status   Specimen Description BLOOD LEFT ANTECUBITAL  Final   Special Requests   Final    BOTTLES DRAWN AEROBIC AND ANAEROBIC  Blood Culture adequate volume   Culture   Final    NO GROWTH 5 DAYS Performed at [facility], [place] [address], [place], [place] [address]    Report Status 10/10/2019 FINAL  Final  Culture, blood (Routine x 2)     Status: None   Collection Time: 10/05/19  5:55 PM   Specimen: BLOOD  Result Value Ref Range Status   Specimen Description BLOOD LEFT ARM  Final   Special Requests   Final    BOTTLES DRAWN AEROBIC AND ANAEROBIC Blood Culture adequate volume   Culture   Final    NO GROWTH 5 DAYS Performed at [facility], [place] [address] [place], [place] [address]    Report Status 10/10/2019 FINAL  Final  Urine culture     Status: Abnormal   Collection Time: 10/06/19 12:10 AM   Specimen: Urine, Random  Result Value Ref Range Status   Specimen Description URINE, RANDOM  Final   Special Requests NONE  Final   Culture (A)  Final    <10,000  COLONIES/mL INSIGNIFICANT GROWTH Performed at [facility], [place] [address] [place], [place] [address]    Report Status 10/06/2019 FINAL  Final  SARS Coronavirus 2 by RT PCR (hospital order, performed in [facility] hospital lab) Nasopharyngeal Nasopharyngeal Swab     Status: None   Collection Time: 10/06/19 12:15 AM   Specimen: Nasopharyngeal Swab  Result Value Ref Range Status   SARS Coronavirus 2 NEGATIVE NEGATIVE Final    Comment: (NOTE) SARS-CoV-2 target nucleic acids are NOT DETECTED.  The SARS-CoV-2 RNA is generally detectable in upper and lower respiratory specimens during the acute phase of infection. The lowest concentration of SARS-CoV-2 viral copies this assay can detect is 250 copies / mL. A negative result does not preclude SARS-CoV-2 infection and should not be used as the sole basis for treatment or other patient management decisions.  A negative result may occur with improper specimen collection / handling, submission of specimen other than nasopharyngeal swab, presence of viral mutation(s) within the areas targeted by this assay, and inadequate number of viral copies (<250 copies / mL). A negative result must be combined with clinical observations, patient history, and epidemiological information.  Fact Sheet for Patients:   StrictlyIdeas.no  Fact Sheet for Healthcare Providers: BankingDealers.co.za  This test is not yet approved or  cleared by the [place] FDA and has been authorized for detection and/or diagnosis of SARS-CoV-2 by FDA under an Emergency Use Authorization (EUA).  This EUA will remain in effect (meaning this test can be used) for the duration of the COVID-19 declaration under Section 564(b)(1) of the Act, 21 U.S.C. section 360bbb-3(b)(1), unless the authorization is terminated or revoked sooner.  Performed at [facility], [place] [address] [place], [place] [address]   MRSA PCR  Screening     Status: None   Collection Time: 10/06/19  9:59 PM   Specimen: Nasal Mucosa; Nasopharyngeal  Result Value Ref Range Status   MRSA by PCR NEGATIVE NEGATIVE Final    Comment:        The GeneXpert MRSA Assay (FDA approved for NASAL specimens only), is one component of a comprehensive MRSA colonization surveillance program. It is not intended to diagnose MRSA infection nor to guide or monitor treatment for MRSA infections. Performed at [facility], [place] [address] [place], [place] [address]     Studies/Results: No results found.  Assessment: 69 year old woman with complicated  medical history including CVA x2, acute bilateral DVTs, and bladder cancer who was undergone multiple admissions for gross hematuria requiring blood transfusions secondary to bladder tumor followed by resection and ongoing anticoagulation.  During this hospital admission an IVC filter was placed anticoagulation was stopped.  She remains on 81 mg of daily aspirin.   Urology interventions: 09/01/19: Cysto, clot evacuation, discovered to have bladder tumor intraop, TURBT 09/22/19: Cysto, clot evacuation, re-staging TURBT 09/28/19: Cysto, clot evacuation, fulguration (no active bladder seen, no residual tumor burden) 10/06/19: Bedside clot irrigation and CBI started  Plan: -CBI has been turned off and urine remains clear yellow -Patient can be discharged to SNF with Foley catheter and void trial at the end of this week -Follow-up at [facility] urology for discussion of intravesical BCG for the high-grade T1 bladder cancer  Jacalyn Lefevre, MD Urology 10/12/2019, 4:03 PM

## 2019-10-12 NOTE — Progress Notes (Signed)
FYI: Pt did not want to be disturbed throughout the night again. Pt does not want to be woke up for vitals or lab work. Pt states that she needs rest.   Eleanora Neighbor, RN

## 2019-10-12 NOTE — Progress Notes (Signed)
Spoke with Dr. Claudia Desanctis of [facility] this morning regarding Lisa [name]'s urine color.   Still on aspirin 81 mg daily.  No other anticoagulation.  IVC filter placed 8/25.  Lisa [name]'s urine was orange this morning in the tubing.  Patient expressed frustration with CBI, believes it is interfering with her ability to get up and around, do PT, and go to her SNF of choice. Dr. Claudia Desanctis requested a photo dropped in Epic of the urine color in the foley tubing.  She will try to come by and see patient this evening; she is currently at [facility] today and will not be able to come over until later.  In regards to further management of recurrent bleeding, she does not believe that going to the OR for a fourth time will do anything she has not already done.  She believes that Lisa [name] is not emptying her bladder adequately s/p CVA and that this is the main issue.  She related that it is a vicious cycle of removing tumor burden, forming a scab in the bladder, bladder distention due to inadequate emptying, and rebleed due to scab displacement.  She does not believe patient needs to go back to the OR for any further management.  Recommends continuing CBI clamped off and seeing how she does throughout the day. We will look at the urine color in the foley tubing not the foley bag.   Ezequiel Essex, MD

## 2019-10-12 NOTE — Plan of Care (Signed)
  Problem: Health Behavior/Discharge Planning: ?Goal: Ability to manage health-related needs will improve ?Outcome: Progressing ?  ?Problem: Elimination: ?Goal: Will not experience complications related to urinary retention ?Outcome: Progressing ?  ?

## 2019-10-12 NOTE — Progress Notes (Signed)
PT Cancellation Note  Patient Details Name: Lisa [name] MRN: [id number] DOB: 1950/11/24   Cancelled Treatment:    Reason Eval/Treat Not Completed: Patient declined, no reason specified   Will follow up later today as time allows;  Otherwise, will follow up for PT tomorrow;   Thank you,  Roney Marion, PT  Acute Rehabilitation Services Pager [phone] Office [phone]     Colletta [place] 10/12/2019, 4:10 PM

## 2019-10-12 NOTE — Progress Notes (Signed)
Lab tried to draw blood this am and pt still refused.   Eleanora Neighbor, RN

## 2019-10-12 NOTE — Progress Notes (Signed)
Family Medicine Teaching Service Daily Progress Note Intern Pager: [phone]  Patient name: Lisa [name] Medical record number: [id number] Date of birth: September 29, 1950 Age: 69 y.o. Gender: female  Primary Care Provider: Patient, No Pcp Per Consultants: Urology, IR Code Status: Full  Pt Overview and Major Events to Date:  8/23 Admitted 8/24bedside clot irrigation,2u pRBC 8/25 IVC filter placement  Assessment and Plan: Lisa [name] is a 69 yo female who presented with gross hematuria and blood loss anemia secondary to known papillary carcinoma of the bladder.  PMH includes papillary carcinoma of the bladder, bilateral DVTs, CVA, HTN, HLD, T2 DM.  Medically stable for discharge to SNF.   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 Pt requesting disconnect of CBI tubing from foley line. Urine in foley tube light orange/tea colored. Dr. Claudia Desanctis from urology will not be taking pt back to OR d/t no further potential benefit. Rec keeping CBI off and monitoring urine color in foley tube.  -Unknown AM Hgb d/t pt refusing labs (transfusion threshold 8) -Placed order for nurse to disconnect CBI tube -Dr. Claudia Desanctis to see pt later this evening -continue aspirin 81 mg -continue foley catheter (8/24-)  SIRS- resolved Now resolved,vital signs WNL, tele o/n SNR. No AM labsd/t pt refusal. Urine cx negative, treated for UTI empirically x5d. - cephalexin (8/25-8/28) -s/pIV cefepime (8/23-8/24) - s/p vancomycin (8/23) - can d/c telemetry given stable Hgb for comfort  DVT Patient was found to have bilateral LE DVTs during previous admission in the setting of known malignancy. Presumed to have PE; however, not officially diagnosed because patient declined CTA imaging.Now s/p IVC filter placement,oral anticoagulationdiscontinued given bleeding from bladder. -apixabandiscontinued - f/u IR outpatient 3-6 months for potential IVC  retrieval  HTN BP this AM not yet performed at patient's request to allow to rest. Patient with history of hypertension.. Home meds include: Losartan 25mg  QD, Metoprolol 25mg  QD, Amlodipine 10mg  QD. Goal BP: 130-150.   -continuelosartan and amlodipine - holding metoprolol for now  T2DM A1C 6.5%. Home meds: Metformin 500mg  QD.  CBGs this AM not obtained. Well-controlled on sliding scale insulin. 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 She has known carotid disease s/p endovascular stent placement on 7/8. Carotid ultrasound during last admission notable for patency with <50% stenosis.Ideally would be on DAPT, but given high bleeding risk, will continue patient on aspirin only. Discussed with neurology and vascular surgery, they are in agreement to continue ASA only and stopping clopidogrel. Home meds: ASA, Atorvastatin 40mg  QD.  -cont ASA - holding clopidogrel given high bleeding risk -Continue atorvastatin  Uterine Mass Chronic, stable.Uterine fundal mass noted on last CT abd/pelvis on 8/5 measuring 8x12x11. CT Abd/Pelvis on admission notable for enhancing heterogenous mass off uterine fundus measuring 8.6cm consistent with a largeexophytic fibroid. Per chart review, patient to have repeat outpatient TVUS.  -outpatient TVUS  FEN/GI: Regular diet PPx: SCDs  Disposition: MedSurg, wants SNF in [place].  Medically stable for discharge to SNF  Subjective:  Lisa [name] was found laying in bed this morning, awake and alert. She has no complaints overnight about her sleep, how she feels, nausea, vomiting, etc. She is persistent about disconnecting the bladder irrigation line from her foley line, saying it is interfering with her ambulation. She also believes it is preventing her from doing exercises  with PT and from going  to her SNF of choice. She repeats that she doesn't want to miss her chance to go to her SNF of choice because "you keep hooking up stuff to me to keep me attached".   Objective: Temp:  [97.9 F (36.6 C)-98.8 F (37.1 C)] 97.9 F (36.6 C) (08/30 0932) Pulse Rate:  [82-88] 87 (08/30 0932) Resp:  [18] 18 (08/30 0932) BP: (123-139)/(63-78) 136/63 (08/30 0932) SpO2:  [97 %-99 %] 97 % (08/30 0932)  Physical Exam: General: Awake, alert, in no acute distress Cardiovascular: Regular rate and rhythm, S1 and S2 present, no murmurs auscultated Respiratory: Clear to auscultation bilaterally in anterior lung fields Extremities: No BLE edema GU/GI: Urine in Foley bag appears medium dark orange, CBI clamped at time of conversation Psych: Perseverative with complaints, unable to redirect, gave no space to explain the medical reasoning behind the interventions and labs she is refusing  Laboratory: Recent Labs  Lab 10/09/19 0810 10/10/19 0900 10/11/19 0858  WBC 6.9 8.5 9.2  HGB 9.7* 10.5* 10.9*  HCT 31.1* 33.4* 34.6*  PLT 424* 504* 542*   Recent Labs  Lab 10/05/19 1641 10/06/19 0740 10/09/19 0810 10/10/19 0417 10/11/19 0858  NA 138   < > 139 137 139  K 3.2*   < > 4.0 3.4* 3.7  CL 101   < > 109 104 102  CO2 21*   < > 22 23 26   BUN 11   < > <5* <5* 7*  CREATININE 1.22*   < > 0.37* 0.48 0.52  CALCIUM 8.8*   < > 8.4* 8.7* 9.0  PROT 5.8*  --   --   --   --   BILITOT 0.6  --   --   --   --   ALKPHOS 90  --   --   --   --   ALT 16  --   --   --   --   AST 21  --   --   --   --   GLUCOSE 261*   < > 98 91 120*   < > = values in this interval not displayed.    Imaging/Diagnostic Tests: No results found.   Ezequiel Essex, MD 10/12/2019, 11:13 AM PGY-1, [facility] Intern pager: [phone], text pages welcome

## 2019-10-13 LAB — CBC
HCT: 33.8 % — ABNORMAL LOW (ref 36.0–46.0)
Hemoglobin: 10.6 g/dL — ABNORMAL LOW (ref 12.0–15.0)
MCH: 27.8 pg (ref 26.0–34.0)
MCHC: 31.4 g/dL (ref 30.0–36.0)
MCV: 88.7 fL (ref 80.0–100.0)
Platelets: 475 10*3/uL — ABNORMAL HIGH (ref 150–400)
RBC: 3.81 MIL/uL — ABNORMAL LOW (ref 3.87–5.11)
RDW: 16.3 % — ABNORMAL HIGH (ref 11.5–15.5)
WBC: 8 10*3/uL (ref 4.0–10.5)
nRBC: 0 % (ref 0.0–0.2)

## 2019-10-13 LAB — GLUCOSE, CAPILLARY
Glucose-Capillary: 141 mg/dL — ABNORMAL HIGH (ref 70–99)
Glucose-Capillary: 121 mg/dL — ABNORMAL HIGH (ref 70–99)

## 2019-10-13 NOTE — Progress Notes (Signed)
Physical Therapy Treatment Patient Details Name: Lisa [name] MRN: [id number] DOB: 05-17-1950 Today's Date: 10/13/2019    History of Present Illness Pt is a 69 y.o. female who presents with gross hematuria and blood loss anemia in setting of known papillary carcinoma of the bladder. Pt is also s/p IVC filter placement. PMHx significant for: T1 UCC of the bladder, bilateral DVTs, CVA, HTN, HLD, T2DM.    PT Comments    Continuing work on functional mobility and activity tolerance;  Lisa [name] agreed to get up and walk after lengthy discussion re: rationale for walking to help with discharge home (she told me she is going home tomorrow); She was able to walk in her room, to the door and back to the window 3 times (she declined walking in the hallway) without difficulty, and without an assistive device; OK for dc home from PT/functional mobility standpoint    Follow Up Recommendations  Home health PT;Supervision/Assistance - 24 hour     Equipment Recommendations  None recommended by PT    Recommendations for Other Services       Precautions / Restrictions Precautions Precautions: Fall    Mobility  Bed Mobility Overal bed mobility: Needs Assistance Bed Mobility: Supine to Sit     Supine to sit: Supervision;HOB elevated     General bed mobility comments: Supervision for safety, and assist for lines  Transfers Overall transfer level: Needs assistance Equipment used: None Transfers: Sit to/from Stand Sit to Stand: Min guard         General transfer comment: Initial minguard for safety, and for line management; did not need physical assist  Ambulation/Gait Ambulation/Gait assistance: Min guard;Supervision Gait Distance (Feet): 90 Feet (three reps of walking window to door) Assistive device: None Gait Pattern/deviations: Step-through pattern     General Gait Details: No noticeable loss of balance with amb in room without assistive device; minguard at first, but  progressing to Supervision; Overall tolerated well   Stairs             Wheelchair Mobility    Modified Rankin (Stroke Patients Only)       Balance     Sitting balance-Leahy Scale: Good       Standing balance-Leahy Scale: Good                              Cognition Arousal/Alertness: Awake/alert Behavior During Therapy: WFL for tasks assessed/performed;Impulsive Overall Cognitive Status: No family/caregiver present to determine baseline cognitive functioning Area of Impairment: Awareness                               General Comments: Pt initially stated "I'm not getting up today", then "why would I do today what I will be doing at home tomorrow?"; Lengthy discussion initially re: role of acute PT to prepare for dc home, and she then stated "let's get this over with" and then got to EOB impulsively; Refused to put on socks for the session      Exercises      General Comments General comments (skin integrity, edema, etc.): Pt agreeable to end session in chair; Once sitting, I told her I would be back with what I refer to as a "monitor pad", which is actually a chair alarm pad; She asked what that was, and I told her it is a monitor that calls the front desk if she gets  up without assist; She then told me she does not want the monitor pad; I briefly discussed her status and preference not to have the chair alarm on with Lisa [name], her primary RN; We opted to respect her wishes, and she indicated that when she wants to get back to bed, she will call for assist      Pertinent Vitals/Pain Pain Assessment: No/denies pain Pain Intervention(s): Monitored during session    Home Living                      Prior Function            PT Goals (current goals can now be found in the care plan section) Acute Rehab PT Goals Patient Stated Goal: Tells me she will be going home tomorrow PT Goal Formulation: With patient Time For Goal Achievement:  10/22/19 Potential to Achieve Goals: Good Progress towards PT goals: Progressing toward goals    Frequency    Min 3X/week      PT Plan Discharge plan needs to be updated    Co-evaluation              AM-PAC PT "6 Clicks" Mobility   Outcome Measure  Help needed turning from your back to your side while in a flat bed without using bedrails?: None Help needed moving from lying on your back to sitting on the side of a flat bed without using bedrails?: None Help needed moving to and from a bed to a chair (including a wheelchair)?: None Help needed standing up from a chair using your arms (e.g., wheelchair or bedside chair)?: None Help needed to walk in hospital room?: None Help needed climbing 3-5 steps with a railing? : A Lot 6 Click Score: 22    End of Session Equipment Utilized During Treatment: Gait belt Activity Tolerance: Patient tolerated treatment well Patient left: in chair;with call bell/phone within reach Nurse Communication: Mobility status PT Visit Diagnosis: Other symptoms and signs involving the nervous system (R29.898);Unsteadiness on feet (R26.81);Muscle weakness (generalized) (M62.81)     Time: 1017-1040 PT Time Calculation (min) (ACUTE ONLY): 23 min  Charges:  $Gait Training: 23-37 mins                     Roney Marion, [place]  Acute Rehabilitation Services Pager [phone] Office [phone]    Colletta Maryland 10/13/2019, 1:03 PM

## 2019-10-13 NOTE — Progress Notes (Signed)
Notified by the PT assistant that pt was on the floor and husband yelling, when staff entered pt sitting on the floor, PT assisting her to get up, staff tried to help her, when asked pt she said she tried to get up from chair to bed but she sat on the floor and call her husband.  When asked patient reported no dizziness, blackout and didn't hit her head.  Husband meanwhile was yelling at the staff calling "assess and slacks and not fit for the job", he was verbally aggressive and rude towards the staff.  Patient back in the bed, took VS, notified the physician, will continue to monitor.

## 2019-10-13 NOTE — Progress Notes (Signed)
Patient refused to have her vital signs and CBG taken.

## 2019-10-13 NOTE — Progress Notes (Signed)
Staff was notified of patient being on the floor. Patient was situated on the chair by physical therapist and was informed that patient preferred not to have the monitor pad. Patient was informed to call for staff when needing assistance.  Rn was informed of patient on the floor with husband yelling at the staffs. Patient was assisted x2 assists back on the bed and was able to stand up and move back into bed. The patient was assessed and vital signs checked by other staff present with husband still being aggressive to the staff. Each one of the staff had left and I was still in the computer putting in the vital signs when husband was getting aggressive again to me and yelling . Husband yelling for me to get the hell ou of there and take your damn machines out. Husband on my face as I was going out in an aggressive manner and slammed the door after me. Notified physician of patient having fall. Physicians came to see patient in room. With their assesment in the progress notes.

## 2019-10-13 NOTE — Plan of Care (Signed)
  Problem: Education: Goal: Knowledge of General Education information will improve Description: Including pain rating scale, medication(s)/side effects and non-pharmacologic comfort measures 10/13/2019 1437 by Dolores Hoose, RN Outcome: Progressing 10/13/2019 1436 by Dolores Hoose, RN Outcome: Progressing   Problem: Activity: Goal: Risk for activity intolerance will decrease 10/13/2019 1437 by Dolores Hoose, RN Outcome: Progressing 10/13/2019 1436 by Dolores Hoose, RN Outcome: Progressing   Problem: Elimination: Goal: Will not experience complications related to bowel motility Outcome: Progressing   Problem: Safety: Goal: Ability to remain free from injury will improve Outcome: Progressing

## 2019-10-13 NOTE — TOC Progression Note (Signed)
Transition of Care [facility]) - Progression Note    Patient Details  Name: Lisa [name] MRN: [id number] Date of Birth: 02-29-1950  Transition of Care [facility]) CM/SW Contact  Bartholomew Crews, RN Phone Number: [phone] 10/13/2019, 2:40 PM  Clinical Narrative:     [facility] referral accepted by [facility] for RN, PT, OT, ST - patient was still active from previous encounter. Patient will need [place] orders for resumption of services.   Spoke with [name] who confirmed that DME order has been processed with pending delivery for either tonight or tomorrow. He is ready for her to transition home in the morning. MD notified.   TOC following for transition needs.   Expected Discharge Plan: [place] Services Barriers to Discharge: Other (comment) (working on [employer] agency and DME delivery)  Expected Discharge Plan and Services Expected Discharge Plan: [place] In-house Referral: Clinical Social Work Discharge Planning Services: CM Consult Post Acute Care Choice: Home Health, Durable Medical Equipment Living arrangements for the past 2 months: [place], [place] (Patient came back to hospital from SNF (was there a few days) and prior to this was in hospital. Originally from home) Expected Discharge Date: 10/09/19               DME Arranged: Hospital bed, Gilford Rile DME Agency: [facility] Date DME Agency Contacted: 10/13/19 Time DME Agency Contacted: 22 Representative spoke with at DME Agency: [name] HH Arranged: RN, PT, OT, Speech Therapy [place] Agency: [facility] [place] Date [place]: 10/13/19 Time [place]: [id number] Representative spoke with at [place] ([place]) Interventions    Readmission Risk Interventions No flowsheet data found.

## 2019-10-13 NOTE — TOC Progression Note (Signed)
Transition of Care [facility]) - Progression Note    Patient Details  Name: Lisa [name] MRN: [id number] Date of Birth: 1950/03/22  Transition of Care [facility]) CM/SW Contact  Bartholomew Crews, RN Phone Number: [phone] 10/13/2019, 2:12 PM  Clinical Narrative:     Spoke with patient's sister-n-law, Lisa [name], on the phone to discuss transition family. She confirmed that [facility] has been arranged to meet home care needs 24/7 beginning tomorrow 9/1 afternoon.   Discussed additional assistance from home health for RN, PT, OT, ST. Discussed the difference between home care needs and home health needs.   Discussed DME needs for hospital bed and RW. Referral sent to [facility] for delivery of DME to the home.   Patient will need ambulance transport home.   Spoke with patient at the bedside to update on plans in motion. Discussed ongoing conversations with Lisa [name], and advised of follow up with Lisa [name].   Patient confirmed that she does not have a PCP. Encouraged to follow up to set up with PCP. Spoke with MD to discuss PCP needs for signing home health orders and plan of care. [place] agreeable to sign additional home health paperwork after discharge.   Spoke with patient's spouse, Lisa [name], about transition plans. Reviewed home health needs in addition to home care services. Lisa [name] stated that patient had previously been active with [facility] for PT, OT, ST. Referral pending with [facility].   Discussed referral to [facility] for DME. Advised that [employer] will call to schedule delivery. Contact number for [employer] given to [facility].   Lisa [name] agreed with ambulance transportation.   TOC following for transition needs.   Expected Discharge Plan: [place] Services Barriers to Discharge: Other (comment) (working on [employer] agency and DME delivery)  Expected Discharge Plan and Services Expected Discharge Plan: [place] In-house Referral: Clinical  Social Work Discharge Planning Services: CM Consult Post Acute Care Choice: Home Health, Durable Medical Equipment Living arrangements for the past 2 months: [place], [place] (Patient came back to hospital from SNF (was there a few days) and prior to this was in hospital. Originally from home) Expected Discharge Date: 10/09/19               DME Arranged: Hospital bed, Gilford Rile DME Agency: [facility] Date DME Agency Contacted: 10/13/19 Time DME Agency Contacted: 1410 Representative spoke with at DME Agency: [name] HH Arranged: RN, PT, OT, Speech Therapy           Social Determinants of Health ([place]) Interventions    Readmission Risk Interventions No flowsheet data found.

## 2019-10-13 NOTE — Progress Notes (Addendum)
    Durable Medical Equipment  (From admission, onward)         Start     Ordered   10/13/19 1251  For home use only DME Hospital bed  Once       Question Answer Comment  Length of Need 6 Months   Patient has (list medical condition): CVA, bladder cancer   The above medical condition requires: Patient requires the ability to reposition frequently   Bed type Semi-electric   Support Surface: Gel Overlay      10/13/19 1250

## 2019-10-13 NOTE — Plan of Care (Signed)
  Problem: Education: Goal: Knowledge of General Education information will improve Description: Including pain rating scale, medication(s)/side effects and non-pharmacologic comfort measures Outcome: Progressing   Problem: Health Behavior/Discharge Planning: Goal: Ability to manage health-related needs will improve Outcome: Progressing   Problem: Activity: Goal: Risk for activity intolerance will decrease Outcome: Progressing   

## 2019-10-13 NOTE — Progress Notes (Signed)
Received page that Lisa [name] was found on the floor by her nurse.  Drs. Yisell Sprunger and Simmons-Robinson left rounds and went immediately to see Lisa [name]. She reports being in the recliner and wanting to get back into bed. Despite being asked to stay in the chair until staff could assist, she tried to stand up and get to the bed by herself. She reports not being able to lift herself to a full standing position and subsequently slid down to the floor.   She denies hitting any part of her body. Denies injuries and pain. Did not hit head, get dizzy, or lose consciousness. She says that "I was just being stupid, I am not going to do it again. I'm staying in this bed until discharge."   Physical Exam Gen: awake, alert, conversational, no acute distress Resp: normal work of breathing Neuro: able to move all extremities spontaneously Psych: word finding difficulties congruent with post-CVA baseline Skin: no fresh bruises or lacerations. One laceration on dorsum of right foot, was bleeding a small amount, pt reports this is not new, hemostasis achieved quickly  Lisa Essex, MD

## 2019-10-13 NOTE — TOC Progression Note (Signed)
Transition of Care [facility]) - Progression Note    Patient Details  Name: Lisa [name] MRN: [id number] Date of Birth: Jan 24, 1951  Transition of Care [facility]) CM/SW Contact  Sharlet Salina Mila Homer, LCSW Phone Number: 10/13/2019, 11:58 AM  Clinical Narrative:  Talked with sister-in-law Lisa [name] [phone]) on 8/30 and today regarding patient's discharge. On 8/30 discussed patient being care for at home with 24/7 versus intermittent care. Lisa [name] reported to [place] on 8/31 that they have decided on 24/7 care at home through [facility] and added that patient's husband is adamant about his wife coming home. Lisa [name] reported that a bed is needed. Nurse case manager advised and will f/u with Lisa [name].      Expected Discharge Plan: [place] Barriers to Discharge: Continued Medical Work up, SNF Pending bed offer (Initiated facility search 8/26)  Expected Discharge Plan and Services Expected Discharge Plan: [place] In-house Referral: Clinical Social Work     Living arrangements for the past 2 months: [place], [place] (Patient came back to hospital from SNF (was there a few days) and prior to this was in hospital. Originally from home) Expected Discharge Date: 10/09/19                                   Social Determinants of Health (SDOH) Interventions  No SDOH interventions requested or needed at this time  Readmission Risk Interventions No flowsheet data found.

## 2019-10-13 NOTE — Progress Notes (Signed)
   10/13/19 1137  What Happened  Was fall witnessed? No  Was patient injured? No  Patient found on floor  Found by Staff-comment (husband found wife on the floor and called PT assistant)  Stated prior activity to/from bed, chair, or stretcher  Follow Up  MD notified Family medicine  Time MD notified 32  Family notified Yes - comment (husband at bedside)  Time family notified 1130  Additional tests No  Simple treatment Other (comment) (assisted x2 back to bed and checked vitals)  Progress note created (see row info) Yes  Adult Fall Risk Assessment  Risk Factor Category (scoring not indicated) High fall risk per protocol (document High fall risk)  Age 69  Fall History: Fall within 6 months prior to admission 0  Elimination; Bowel and/or Urine Incontinence 0  Elimination; Bowel and/or Urine Urgency/Frequency 0  Medications: includes PCA/Opiates, Anti-convulsants, Anti-hypertensives, Diuretics, Hypnotics, Laxatives, Sedatives, and Psychotropics 0  Patient Care Equipment 2  Mobility-Assistance 2  Mobility-Gait 2  Mobility-Sensory Deficit 0  Altered awareness of immediate physical environment 0  Impulsiveness 2  Lack of understanding of one's physical/cognitive limitations 4  Total Score 13  Patient Fall Risk Level High fall risk  Adult Fall Risk Interventions  Required Bundle Interventions *See Row Information* High fall risk - low, moderate, and high requirements implemented  Additional Interventions Use of appropriate toileting equipment (bedpan, BSC, etc.);PT/OT need assessed if change in mobility from baseline  Screening for Fall Injury Risk (To be completed on HIGH fall risk patients) - Assessing Need for Low Bed  Risk For Fall Injury- Low Bed Criteria None identified - Continue screening  Screening for Fall Injury Risk (To be completed on HIGH fall risk patients who do not meet crieteria for Low Bed) - Assessing Need for Floor Mats Only  Risk For Fall Injury- Criteria for  Floor Mats Noncompliant with safety precautions  Will Implement Floor Mats Yes  Vitals  Temp Source Oral  BP (!) 177/78  MAP (mmHg) 104  BP Location Right Arm  BP Method Automatic  Patient Position (if appropriate) Lying  Pulse Rate (!) 117  Pulse Rate Source Dinamap  Resp 18  Oxygen Therapy  SpO2 98 %  O2 Device Room Air  Patient Activity (if Appropriate) In bed  Pain Assessment  Pain Scale 0-10  Pain Score 0  Neurological  Neuro (WDL) WDL  Level of Consciousness Alert  Orientation Level Oriented X4  Cognition Appropriate at baseline  Speech Clear  Seizure Activity  Psychomotor Symptoms None

## 2019-10-13 NOTE — Progress Notes (Signed)
Family Medicine Teaching Service Daily Progress Note Intern Pager: [phone]  Patient name: Lisa [name] Medical record number: [id number] Date of birth: [birth year] Age: 69 y.o. Gender: female  Primary Care Provider: Patient, No Pcp Per Consultants: Urology, IR Code Status: Full  Pt Overview and Major Events to Date:  8/23 admitted 8/24 bedside clot irrigation, 2 units packed red blood cells 8/25 IVC filter placement  Assessment and Plan: [name] is a 69 year old female who presented to the hospital with gross hematuria and blood loss anemia secondary to stone papillary carcinoma of the bladder.  Other PMH includes bilateral DVTs, CVA, HTN, HLD, T2DM.  Medically stable for discharge home with urology outpatient follow-up.  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  CBI disconnected since early a.m. 8/30.  Per urology, may discharge patient with Foley in and schedule [facility] for Tuesday where they will do a voiding trial. 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  Ideal treatment is DAPT but due to high bleed risk from bladder, doing aspirin only at this time. -Continue aspirin -Continue atorvastatin -Hold clopidogrel  HTN Home meds are losartan 25 mg daily, metoprolol 25 mg daily, moderate pain 10 mg daily.  Goal SBP 130-150.  BP today 127-140/74-80. -Continue losartan and amlodipine -Holding metoprolol at this time  T2DM Admission A1c 6.5%.  At home takes Metformin 500 mg daily.  Inpatient, well controlled with sliding scale insulin. -Holding home Metformin -Continue sensitive SSI -Monitor  CBGs  FEN/GI: Regular diet PPx: SCDs given increased bleed risk  Disposition: MedSurg, medically stable for discharge to home with home health  Subjective:  [name] was found sitting up in bed this morning awake and alert.  Upon entering the room, she looked at me and said "I have been waiting to talk to you".  She expressed that she wanted to be discharged either today or tomorrow, she wanted the Foley out, and that she wanted to be more in control and have her life back.  She discussed wanting to "get back to my old self" as soon as possible.  Her husband joined [place] in the room about halfway through our conversation.  We discussed that we would need to coordinate the homemade, home health, and DME for home with the social worker and case Freight forwarder.  We also discussed that the urologist, Dr. Claudia Desanctis, would like to have her keep the Foley in for 7 days after discharge and follow-up outpatient at [facility] urology for Foley removal and voiding trial.  Objective: Temp:  [98 F (36.7 C)-98.5 F (36.9 C)] 98.5 F (36.9 C) (08/31 1300) Pulse Rate:  [85-117] 88 (08/31 1300) Resp:  [18-19] 18 (08/31 1300) BP: (127-177)/(74-80) 135/80 (08/31 1300) SpO2:  [96 %-100 %] 100 % (08/31 1300)  Physical Exam General: Awake, alert, oriented Extremities: No bilateral lower extremity edema, palpable pedal and pretibial pulses bilaterally Neuro: Cranial nerves II through X grossly intact, able to move all extremities spontaneously GU/GI: Foley catheter in place, urine in Foley tube was light orange/tea colored with pink sediment settled throughout the tube Psych: Word finding difficulties in setting of recent prior CVAs, repetitive with requests   Laboratory: Recent Labs  Lab 10/10/19  0900 10/11/19 0858 10/13/19 0531  WBC 8.5 9.2 8.0  HGB 10.5* 10.9* 10.6*  HCT 33.4* 34.6* 33.8*  PLT 504* 542* 475*   Recent Labs  Lab 10/09/19 0810 10/10/19 0417 10/11/19 0858  NA 139 137 139  K 4.0 3.4* 3.7  CL  109 104 102  CO2 22 23 26   BUN <5* <5* 7*  CREATININE 0.37* 0.48 0.52  CALCIUM 8.4* 8.7* 9.0  GLUCOSE 98 91 120*    Imaging/Diagnostic Tests: No results found.   Lisa Essex, MD 10/13/2019, 4:04 PM PGY-1, [place] Intern pager: [phone], text pages welcome

## 2019-10-14 ENCOUNTER — Ambulatory Visit: Payer: Medicare Other | Admitting: Cardiology

## 2019-10-14 LAB — CBC
HCT: 33.2 % — ABNORMAL LOW (ref 36.0–46.0)
Hemoglobin: 10.2 g/dL — ABNORMAL LOW (ref 12.0–15.0)
MCH: 26.9 pg (ref 26.0–34.0)
MCHC: 30.7 g/dL (ref 30.0–36.0)
MCV: 87.6 fL (ref 80.0–100.0)
Platelets: 453 10*3/uL — ABNORMAL HIGH (ref 150–400)
RBC: 3.79 MIL/uL — ABNORMAL LOW (ref 3.87–5.11)
RDW: 16.3 % — ABNORMAL HIGH (ref 11.5–15.5)
WBC: 10 10*3/uL (ref 4.0–10.5)
nRBC: 0 % (ref 0.0–0.2)

## 2019-10-14 LAB — GLUCOSE, CAPILLARY: Glucose-Capillary: 135 mg/dL — ABNORMAL HIGH (ref 70–99)

## 2019-10-14 NOTE — Discharge Instructions (Signed)
Dear Lisa [name],   Thank you so much for allowing [place] to be part of your care!  You were admitted to [facility] for recurrence of bleeding from your bladder.  You had a bedside procedure to control the bleeding in your bladder and had a Foley catheter inserted to allow your bladder to heal.  You required blood transfusions due to your bleeding.  You had a procedure to get an IVC filter, which is a device that helps prevent blood clots from traveling to your lungs, so that we can stop some of your blood thinners and prevent you from bleeding again.  We stopped your Eliquis and Plavix.  You will still need to take your aspirin.   POST-HOSPITAL & CARE INSTRUCTIONS 1. Continue to take aspirin ONLY.  Stop taking Eliquis and Plavix. 2. You will need to keep the Foley catheter in until your appointment with [facility] on Tuesday, 9/7 at 8:30am. 3. You will follow up with the interventional radiologist in 3 months to discuss possible retrieval of the IVC filter (November 2021). 4. Please let PCP/Specialists know of any changes that were made.  5. Please see medications section of this packet for any medication changes.   DOCTOR'S APPOINTMENT & FOLLOW UP CARE INSTRUCTIONS  Future Appointments  Date Time Provider [place]  10/14/2019  8:40 AM Donato Heinz, MD [facility]  12/25/2019  9:00 AM [facility] V[facility] 5 - [facility]  12/25/2019 10:00 AM Waynetta Sandy, MD [facility]    RETURN PRECAUTIONS: Please return if you see significant bleeding in your urine again.  If you do need to return, we would recommend you go to [facility] where they have more comprehensive urology services.  Take care and be well!  [facility]  [place], [place] [phone]

## 2019-10-14 NOTE — Discharge Summary (Addendum)
[facility] Discharge Summary  Patient name: Lisa [name] Medical record number: [id number] Date of birth: [birth year] Age: 69 y.o. Gender: female Date of Admission: 10/05/2019  Date of Discharge: 10/14/2019 Admitting Physician: Danna Hefty, DO  Primary Care Provider: Patient, No Pcp Per Consultants: Urology  Indication for Hospitalization: Symptomatic Anemia secondary to hematuria  Discharge Diagnoses/Problem List:  Patient Active Problem List   Diagnosis Date Noted   Lower leg DVT (deep venous thromboembolism), acute, bilateral ([place])    Acute deep vein thrombosis (DVT) of proximal vein of both lower extremities (HCC)    Symptomatic anemia    Weakness generalized    Aphasia    Palliative care by specialist    DNR (do not resuscitate) discussion    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   Lacunar infarct, acute ([place]) 09/17/2019   Acute blood loss anemia 09/17/2019   Pressure injury of skin 09/07/2019   Gross hematuria 09/01/2019   Hematuria 08/31/2019   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 ([place]) 08/20/2019   Stenosis of carotid artery    Abnormality of aortic valve    DM (diabetes mellitus), type 2, uncontrolled w/neurologic complication (HCC)    Hypercholesteremia    CVA (cerebral vascular accident) ([place]) 08/12/2019   Acute ischemic stroke ([place])    Hypertension      Disposition: Discharge home  Discharge Condition: Stable, improved  Discharge Exam:  General: Awake, alert, oriented, in good spirits this morning  CV: RRR, no murmur appreciated, pedal pulses 2+ bilaterally Extremities: No edema of lower extremities  Neuro: Cranial nerves II through X grossly intact, able to move all extremities spontaneously GU/GI: Foley catheter in place, urine in Foley tube was light orange/honey colored    Brief Hospital Course:   Lisa [name] is a 69 y.o. female with a history of T1 UCC of the  bladder, bilateral DVTs, CVA, HTN, HLD, T2DM who presented with gross hematuria and blood loss anemia in the setting of known papillary carcinoma of the bladder. 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  She was discharged to SNF, but returned due to recurrence of bladder bleed.  CTA A/P showed evidence of blood clot and prominent [name] along the bladder base with possible active extravasation into the bladder lumen.  Urology was consulted, Dr. Lovena Neighbours performed bedside clot irrigation with balloon tamponade with successful control of bleed.  Foley catheter was also placed with continuous bladder irrigation.  Her anticoagulation and antiplatelet agents were held given bleed, though she was continued on lower dose heparin drip given her known DVTs.  On 8/24, she received 2 units of pRBC for hemoglobin 6.6, which responded appropriately.  She had an IVC filter placed by interventional radiology with the intention of stopping anticoagulation given recurrent bladder bleeds.  After placement of IVC filter, her heparin drip was stopped and aspirin was restarted on 8/26.  CBI was ultimately weaned off on 8/30.  Prior to discharge, her hemoglobin was stable at 10.2 and her urine remained clear. She was discharged on aspirin alone, and her apixaban and clopidogrel were discontinued.  She was also discharged with Foley catheter with plan to maintain in place for 10 total days (to be removed 9/7).  SIRS Patient presented with leukocytosis with neutrophilic predominance, mild  tachypnea, and mild tachycardia without fever.  Initially had some elevated lactate as well.  Her vital signs normalized within hours and her lactate returned to normal on the next lab draw with IV fluids.  Work-up, including CXR, CT A/P, and  Covid test, was unremarkable. Urinalysis was obtained s/p bladder irrigation, so was felt to be inaccurate. Patient initially started on broad spectrum IV abx.  When urine culture demonstrated no growth, she was transitioned to cephalexin for empiric UTI treatment given initial leukocytosis, treatment ended 10/09/19.  DVT Patient noted to have bilateral LE DVT's during last admission in setting of known papillary cancer.  There was also some concern for PE during previous admission; however, patient declined CTA imaging.  Her apixaban was held during admission. Patient was started on heparin drip that was discontinued after IVC filter placement on 8/25.  Her anticoagulation was discontinued on discharge.  CVA  Carotid artery disease She has history of multiple CVAs including lacunar stroke (8/5) and left MCA infarct (6/30) with residual neurological deficits. She has known carotid disease s/p endovascular stent placement on 7/8. Carotid ultrasound during last admission notable for patency with <50% stenosis.  Upon discussion with neurology and vascular surgery, decision was made to discharge patient on aspirin alone without clopidogrel given high risk of bleeding.  HTN Patient recently presented with soft blood pressures, so her home antihypertensives were held.  As her blood pressures normalized, she was restarted on her home medications.  She was continued on her home medications on discharge.  T2DM Patient was treated with sliding scale insulin with good control.  Issues for Follow Up:  Discharge with foley in place OP urology appt with Dr. Claudia Desanctis at [facility] urology on 9/7 at 8:45AM (at which time they'll remove foley and do void trial)  Significant Procedures:  8/23 admitted 8/24 bedside clot irrigation, 2 units packed red blood cells 8/25 IVC filter placement  Significant Labs and Imaging:  Recent Labs  Lab 10/11/19 0858 10/13/19 0531 10/14/19 0039  WBC 9.2 8.0 10.0  HGB 10.9* 10.6*  10.2*  HCT 34.6* 33.8* 33.2*  PLT 542* 475* 453*   Recent Labs  Lab 10/08/19 0159 10/08/19 0159 10/09/19 0810 10/09/19 0810 10/10/19 0417 10/11/19 0858  NA 137  --  139  --  137 139  K 3.0*   < > 4.0   < > 3.4* 3.7  CL 104  --  109  --  104 102  CO2 18*  --  22  --  23 26  GLUCOSE 104*  --  98  --  91 120*  BUN <5*  --  <5*  --  <5* 7*  CREATININE 0.54  --  0.37*  --  0.48 0.52  CALCIUM 8.1*  --  8.4*  --  8.7* 9.0   < > = values in this interval not displayed.      Results/Tests Pending at Time of Discharge: None  Discharge Medications:  Allergies as of 10/14/2019   No Known Allergies      Medication List     STOP taking these medications    apixaban 5 MG Tabs tablet Commonly known as: ELIQUIS   clopidogrel 75 MG tablet Commonly known as: PLAVIX   metoprolol tartrate 25 MG tablet Commonly known as: LOPRESSOR       TAKE these medications    amLODipine 10 MG tablet Commonly known as: NORVASC Take 1 tablet (10 mg total) by mouth daily. What changed: when to take this  aspirin 81 MG EC tablet Take 1 tablet (81 mg total) by mouth daily. Swallow whole.   atorvastatin 80 MG tablet Commonly known as: LIPITOR Take 1 tablet (80 mg total) by mouth daily. What changed: when to take this   losartan 25 MG tablet Commonly known as: COZAAR Take 25 mg by mouth in the morning.   metFORMIN 500 MG 24 hr tablet Commonly known as: Glucophage XR Take 1 tablet (500 mg total) by mouth daily with breakfast.   MULTIPLE VITAMIN PO Take 1 tablet by mouth daily.               Durable Medical Equipment  (From admission, onward)           Start     Ordered   10/13/19 1329  For home use only DME Walker rolling  Once       Question Answer Comment  Walker: With [place]   Patient needs a walker to treat with the following condition Decreased functional mobility and endurance      10/13/19 1329   10/13/19 1251  For home use only DME Hospital bed   Once       Question Answer Comment  Length of Need 6 Months   Patient has (list medical condition): CVA, bladder cancer   The above medical condition requires: Patient requires the ability to reposition frequently   Bed type Semi-electric   Support Surface: Gel Overlay      10/13/19 1250            Discharge Instructions: Please refer to Patient Instructions section of EMR for full details.  Patient was counseled important signs and symptoms that should prompt return to medical care, changes in medications, dietary instructions, activity restrictions, and follow up appointments.   Follow-Up Appointments:  1. Needs a scheduled appointment with PCP. 2. Urology appt with Dr. Claudia Desanctis at [facility] urology on 9/7 at 8:45AM   Rise Patience, DO 10/14/2019, 9:21 AM PGY-1, [place]

## 2019-10-14 NOTE — Progress Notes (Addendum)
Occupational Therapy Treatment Patient Details Name: Lisa [name] MRN: [id number] DOB: Jan 17, 1951 Today's Date: 10/14/2019    History of present illness Pt is a 69 y.o. female who presents with gross hematuria and blood loss anemia in setting of known papillary carcinoma of the bladder. Pt is also s/p IVC filter placement. PMHx significant for: T1 UCC of the bladder, bilateral DVTs, CVA, HTN, HLD, T2DM.   OT comments  Pt initially agitated and adamantly refusing to participate in therapy, but eventually agreeable to limited activities with OT. Pt overall min guard for transfers/short mobility without AD and able to complete grooming tasks at Supervision level with intermittent cues for problem-solving. Pt with fall from recliner yesterday and reports she was attempting to get back to bed. Primary limitation in safety and independence during daily tasks is pt's cognitive deficits. Pt reports her plan is to now return home. Pt requires 24/7 supervision due to cognition, recommend physical assist for navigating stairs in home due to high fall risk.   Follow Up Recommendations  SNF;Supervision/Assistance - 24 hour (HHOT & 24/7 if pt plan is to return home now)    Equipment Recommendations  None recommended by OT    Recommendations for Other Services      Precautions / Restrictions Precautions Precautions: Fall Precaution Comments: expressive aphasia Restrictions Weight Bearing Restrictions: No       Mobility Bed Mobility Overal bed mobility: Needs Assistance Bed Mobility: Supine to Sit;Sit to Supine     Supine to sit: Supervision;HOB elevated Sit to supine: Supervision;HOB elevated   General bed mobility comments: Supervision for safety, and assist for lines  Transfers Overall transfer level: Needs assistance Equipment used: None Transfers: Sit to/from Stand Sit to Stand: Min guard         General transfer comment: min guard for sit to stand and short mobility to/from  sink without AD    Balance Overall balance assessment: Needs assistance;History of Falls Sitting-balance support: Feet supported;No upper extremity supported Sitting balance-Leahy Scale: Good     Standing balance support: No upper extremity supported;During functional activity Standing balance-Leahy Scale: Good                             ADL either performed or assessed with clinical judgement   ADL Overall ADL's : Needs assistance/impaired     Grooming: Supervision/safety;Sitting;Brushing hair;Oral care Grooming Details (indicate cue type and reason): Pt overall Supervision for brushing hair after dry shampoo use, oral care seated at sink. Pt declined to stand for tasks.                                General ADL Comments: Pt with deficits in cognition that is primary limiting factor in safe completion of ADLs/transfers. Pt with mild deficits in balance, strength     Vision   Vision Assessment?: No apparent visual deficits   Perception     Praxis      Cognition Arousal/Alertness: Awake/alert Behavior During Therapy: WFL for tasks assessed/performed;Impulsive Overall Cognitive Status: No family/caregiver present to determine baseline cognitive functioning Area of Impairment: Awareness;Problem solving;Safety/judgement                       Following Commands: Follows one step commands consistently;Follows multi-step commands inconsistently Safety/Judgement: Decreased awareness of safety;Decreased awareness of deficits Awareness: Emergent Problem Solving: Slow processing;Requires verbal cues;Difficulty sequencing General Comments:  Pt initially stated she would not be doing therapy today due to going home today and can do therapy there. Pt then reported I will sit on the side of the bed and "that is all Im going to do", then engaged in ADLs with therapist        Exercises     Shoulder Instructions       General Comments Initial plan  was DC to SNF for rehab with pt reporting they did not have a bed at the only facility she would go to. Pt now going home and reports plan to only have caregiver 24/7 for a few days, as well as hospital bed. Inquired about pt's fall from chair yesterday with her reporting she was trying to get back to bed. OT asked if the footrest was up and she said it was.     Pertinent Vitals/ Pain       Pain Assessment: No/denies pain  Home Living                                          Prior Functioning/Environment              Frequency  Min 2X/week        Progress Toward Goals  OT Goals(current goals can now be found in the care plan section)  Progress towards OT goals: Progressing toward goals  Acute Rehab OT Goals Patient Stated Goal: Tells me she will be going home tomorrow OT Goal Formulation: With patient Time For Goal Achievement: 10/22/19 Potential to Achieve Goals: Good ADL Goals Pt Will Perform Grooming: with modified independence;standing Pt Will Perform Upper Body Bathing: with min assist;sitting Pt Will Perform Lower Body Bathing: with modified independence;sit to/from stand Pt Will Perform Lower Body Dressing: with modified independence;sitting/lateral leans;sit to/from stand Pt Will Transfer to Toilet: with modified independence;ambulating;regular height toilet Pt Will Perform Toileting - Clothing Manipulation and hygiene: with modified independence;sit to/from stand;sitting/lateral leans Additional ADL Goal #1: Pt will demonstrate ability to sequence BADLs correctly with min VCs to maximize safety and independence  Plan Discharge plan remains appropriate    Co-evaluation                 AM-PAC OT "6 Clicks" Daily Activity     Outcome Measure   Help from another person eating meals?: A Little Help from another person taking care of personal grooming?: A Little Help from another person toileting, which includes using toliet, bedpan, or  urinal?: A Little Help from another person bathing (including washing, rinsing, drying)?: A Little Help from another person to put on and taking off regular upper body clothing?: A Little Help from another person to put on and taking off regular lower body clothing?: A Little 6 Click Score: 18    End of Session    OT Visit Diagnosis: Unsteadiness on feet (R26.81);Muscle weakness (generalized) (M62.81);Other symptoms and signs involving the nervous system (R29.898);Other symptoms and signs involving cognitive function   Activity Tolerance Patient tolerated treatment well   Patient Left in bed;with call bell/phone within reach;with bed alarm set   Nurse Communication Mobility status        Time: 4536-4680 OT Time Calculation (min): 27 min  Charges: OT General Charges $OT Visit: 1 Visit OT Treatments $Self Care/Home Management : 23-37 mins  Layla Maw, OTR/L   Layla Maw 10/14/2019, 9:11 AM

## 2019-10-14 NOTE — Progress Notes (Signed)
Family Medicine Teaching Service Daily Progress Note Intern Pager: [phone]  Patient name: KYTZIA GIENGER Medical record number: [id number] Date of birth: July 13, 1950 Age: 69 y.o. Gender: female  Primary Care Provider: Patient, No Pcp Per Consultants: Urology, IR Code Status: Full  Pt Overview and Major Events to Date:  8/23 admitted 8/24 bedside clot irrigation, 2 units packed red blood cells 8/25 IVC filter placement  Assessment and Plan: Ms. Takacs is a 69 year old female who presented to the hospital with gross hematuria and blood loss anemia secondary to stone papillary carcinoma of the bladder.  Other PMH includes bilateral DVTs, CVA, HTN, HLD, T2DM.  Medically stable for discharge home with urology outpatient follow-up.  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 Patient is s/p transfusion of 2U PRBC on 8/24. CBI disconnected since early a.m. 8/30.  Per urology, may discharge patient with Foley in and schedule [facility]-up with [facility] for Tuesday where they will do a voiding trial. -Continue aspirin -Continue Foley catheter at discharge -Schedule follow-up appointment with [facility] for Tuesday 8/7 to remove Foley and perform voiding trial  History of CVA  Atherosclerosis  HLD Multiple recent CVAs (08/12/2019 left MCA, 8/5 lacunar) s/p endovascular stent placement on 7/8; s/p IVC filter placement 8/25.  Ideal treatment is DAPT but due to high bleed risk from bladder, doing aspirin only at this time. -Continue aspirin -Continue atorvastatin -Hold clopidogrel  HTN Home meds are losartan 25 mg daily, metoprolol 25 mg daily, amlodipine 10 mg daily.  Goal SBP 130-150.  BP today 115-177/74-98. -Continue losartan and amlodipine -Holding metoprolol at this time  T2DM Admission A1c 6.5%. At home takes Metformin 500 mg daily.  Inpatient, well controlled with sliding  scale insulin.  -Holding home Metformin -Continue sensitive SSI -Monitor CBGs  FEN/GI: Regular diet PPx: SCDs given increased bleed risk  Disposition: MedSurg, medically stable for discharge today to home with home health.  Subjective: Patient states that she is feeling good today, she is ready to go home and feels that she is not currently having any issues. Denies any events overnight. Denies dizziness, HA, dyspnea.  Objective: Temp:  [98.2 F (36.8 C)-98.6 F (37 C)] 98.6 F (37 C) (08/31 1700) Pulse Rate:  [85-117] 92 (08/31 1700) Resp:  [18-19] 18 (08/31 1700) BP: (127-177)/(74-88) 128/88 (08/31 1700) SpO2:  [97 %-100 %] 100 % (08/31 1700)  Physical Exam General: Awake, alert, oriented, in good spirits this morning  CV: RRR, no murmur appreciated, pedal pulses 2+ bilaterally Extremities: No edema of lower extremities  Neuro: Cranial nerves II through X grossly intact, able to move all extremities spontaneously GU/GI: Foley catheter in place, urine in Foley tube was light orange/honey colored    Laboratory: Recent Labs  Lab 10/11/19 0858 10/13/19 0531 10/14/19 0039  WBC 9.2 8.0 10.0  HGB 10.9* 10.6* 10.2*  HCT 34.6* 33.8* 33.2*  PLT 542* 475* 453*   Recent Labs  Lab 10/09/19 0810 10/10/19 0417 10/11/19 0858  NA 139 137 139  K 4.0 3.4* 3.7  CL 109 104 102  CO2 22 23 26   BUN <5* <5* 7*  CREATININE 0.37* 0.48 0.52  CALCIUM 8.4* 8.7* 9.0  GLUCOSE 98 91 120*    Imaging/Diagnostic Tests: No results found.    Rise Patience, DO 10/14/2019, 5:23 AM PGY-1, [place] Intern pager: [phone], text pages welcome

## 2019-10-14 NOTE — TOC Transition Note (Signed)
Transition of Care [facility]) - CM/SW Discharge Note   Patient Details  Name: Lisa [name] MRN: [id number] Date of Birth: 08-19-1950  Transition of Care [facility]) CM/SW Contact:  Bartholomew Crews, RN Phone Number: [phone] 10/14/2019, 10:28 AM   Clinical Narrative:     Received call from spouse this morning. He is ready to receive patient at home. He stated that bed was delivered last night. Discussed readiness to transition home with MD - cleared for DC. HH orders provided - [facility] made aware. [facility] notified for transport - transport paperwork on chart. Nursing aware. Spouse aware of pending [facility] transport. Requests to be notified when [place] arrives - nursing aware. No further TOC needs identified.   Final next level of care: [place] Barriers to Discharge: No Barriers Identified   Patient Goals and CMS Choice Patient states their goals for this hospitalization and ongoing recovery are:: return home with husband and agency support CMS Medicare.gov Compare Post Acute Care list provided to:: Patient Choice offered to / list presented to : Patient, Spouse, [place] / [place]  Discharge Placement                       Discharge Plan and Services In-house Referral: Clinical Social Work Discharge Planning Services: CM Consult Post Acute Care Choice: Home Health, Durable Medical Equipment          DME Arranged: Hospital bed, [place] Rile DME Agency: [employer] Date DME Agency Contacted: 10/13/19 Time DME Agency Contacted: [phone] Representative spoke with at DME Agency: [name] HH Arranged: RN, PT, OT, Speech Therapy [place] Agency: [facility] [place] Date [place]: 10/14/19 Time [place]: 1027 Representative spoke with at [place]: Cassie  Social Determinants of Health ([place]) Interventions     Readmission Risk Interventions No flowsheet data found.

## 2019-10-15 ENCOUNTER — Telehealth: Payer: Self-pay

## 2019-10-15 NOTE — Telephone Encounter (Signed)
Received call from patient's sister-n-law, Alexis Goodell ([phone]) asking about home care options d/t previous arrangements with [facility] did not work out as planned. Discussed availability of [facility] and provided contact information. Discussed [facility] to follow up with patient for skilled care. Rod Holler stated that patient wasn't sure she wanted visit today. Encouraged to talk to patient concerning accepting [facility] RN visit in order to do start of care.   Manya Silvas, RN MSN CCM Transitions of Care [phone]

## 2019-10-16 DIAGNOSIS — Z7902 Long term (current) use of antithrombotics/antiplatelets: Secondary | ICD-10-CM | POA: Diagnosis not present

## 2019-10-16 DIAGNOSIS — Z48812 Encounter for surgical aftercare following surgery on the circulatory system: Secondary | ICD-10-CM | POA: Diagnosis not present

## 2019-10-16 DIAGNOSIS — E119 Type 2 diabetes mellitus without complications: Secondary | ICD-10-CM | POA: Diagnosis not present

## 2019-10-16 DIAGNOSIS — Z7984 Long term (current) use of oral hypoglycemic drugs: Secondary | ICD-10-CM | POA: Diagnosis not present

## 2019-10-16 DIAGNOSIS — I6522 Occlusion and stenosis of left carotid artery: Secondary | ICD-10-CM | POA: Diagnosis not present

## 2019-10-16 DIAGNOSIS — I63232 Cerebral infarction due to unspecified occlusion or stenosis of left carotid arteries: Secondary | ICD-10-CM | POA: Diagnosis not present

## 2019-10-16 DIAGNOSIS — Z8673 Personal history of transient ischemic attack (TIA), and cerebral infarction without residual deficits: Secondary | ICD-10-CM | POA: Diagnosis not present

## 2019-10-16 DIAGNOSIS — Z95818 Presence of other cardiac implants and grafts: Secondary | ICD-10-CM | POA: Diagnosis not present

## 2019-10-16 DIAGNOSIS — E1149 Type 2 diabetes mellitus with other diabetic neurological complication: Secondary | ICD-10-CM | POA: Diagnosis not present

## 2019-10-16 DIAGNOSIS — I10 Essential (primary) hypertension: Secondary | ICD-10-CM | POA: Diagnosis not present

## 2019-10-17 ENCOUNTER — Other Ambulatory Visit: Payer: Self-pay

## 2019-10-17 ENCOUNTER — Emergency Department (HOSPITAL_COMMUNITY)
Admission: EM | Admit: 2019-10-17 | Discharge: 2019-10-21 | Disposition: A | Discharge status: Other Acute Inpatient Hospital | Payer: Medicare Other | Attending: Emergency Medicine | Admitting: Emergency Medicine

## 2019-10-17 ENCOUNTER — Encounter (HOSPITAL_COMMUNITY): Payer: Self-pay | Admitting: Emergency Medicine

## 2019-10-17 DIAGNOSIS — R6889 Other general symptoms and signs: Secondary | ICD-10-CM | POA: Diagnosis not present

## 2019-10-17 DIAGNOSIS — Z7982 Long term (current) use of aspirin: Secondary | ICD-10-CM | POA: Diagnosis not present

## 2019-10-17 DIAGNOSIS — R450 Nervousness: Secondary | ICD-10-CM | POA: Diagnosis not present

## 2019-10-17 DIAGNOSIS — Z515 Encounter for palliative care: Secondary | ICD-10-CM | POA: Diagnosis not present

## 2019-10-17 DIAGNOSIS — Z79899 Other long term (current) drug therapy: Secondary | ICD-10-CM | POA: Insufficient documentation

## 2019-10-17 DIAGNOSIS — Y828 Other medical devices associated with adverse incidents: Secondary | ICD-10-CM | POA: Diagnosis not present

## 2019-10-17 DIAGNOSIS — T83511A Infection and inflammatory reaction due to indwelling urethral catheter, initial encounter: Secondary | ICD-10-CM | POA: Insufficient documentation

## 2019-10-17 DIAGNOSIS — Z87891 Personal history of nicotine dependence: Secondary | ICD-10-CM | POA: Diagnosis not present

## 2019-10-17 DIAGNOSIS — Z743 Need for continuous supervision: Secondary | ICD-10-CM | POA: Diagnosis not present

## 2019-10-17 DIAGNOSIS — E119 Type 2 diabetes mellitus without complications: Secondary | ICD-10-CM | POA: Insufficient documentation

## 2019-10-17 DIAGNOSIS — N390 Urinary tract infection, site not specified: Secondary | ICD-10-CM

## 2019-10-17 DIAGNOSIS — Z20822 Contact with and (suspected) exposure to covid-19: Secondary | ICD-10-CM | POA: Insufficient documentation

## 2019-10-17 DIAGNOSIS — Z86718 Personal history of other venous thrombosis and embolism: Secondary | ICD-10-CM | POA: Insufficient documentation

## 2019-10-17 DIAGNOSIS — I10 Essential (primary) hypertension: Secondary | ICD-10-CM | POA: Diagnosis not present

## 2019-10-17 DIAGNOSIS — R531 Weakness: Secondary | ICD-10-CM

## 2019-10-17 DIAGNOSIS — Z7984 Long term (current) use of oral hypoglycemic drugs: Secondary | ICD-10-CM | POA: Insufficient documentation

## 2019-10-17 DIAGNOSIS — Z8673 Personal history of transient ischemic attack (TIA), and cerebral infarction without residual deficits: Secondary | ICD-10-CM | POA: Diagnosis not present

## 2019-10-17 LAB — URINALYSIS, ROUTINE W REFLEX MICROSCOPIC
Bilirubin Urine: NEGATIVE
Glucose, UA: NEGATIVE mg/dL
Ketones, ur: 5 mg/dL — AB
Nitrite: POSITIVE — AB
Protein, ur: >=300 mg/dL — AB
RBC / HPF: >50 RBC/hpf — ABNORMAL HIGH (ref 0–5)
Specific Gravity, Urine: 1.017 (ref 1.005–1.030)
WBC, UA: >50 WBC/hpf — ABNORMAL HIGH (ref 0–5)
pH: 5 (ref 5.0–8.0)

## 2019-10-17 MED ORDER — LORAZEPAM 1 MG PO TABS
0.5000 mg | ORAL_TABLET | Freq: Once | ORAL | Status: AC
  Administered 2019-10-17: 0.5 mg via ORAL
  Filled 2019-10-17: qty 1

## 2019-10-17 MED ORDER — ASPIRIN 81 MG PO CHEW
81.0000 mg | CHEWABLE_TABLET | Freq: Every day | ORAL | Status: DC
  Administered 2019-10-18 – 2019-10-21 (×4): 81 mg via ORAL
  Filled 2019-10-17 (×4): qty 1

## 2019-10-17 MED ORDER — METFORMIN HCL ER 500 MG PO TB24
500.0000 mg | ORAL_TABLET | Freq: Every day | ORAL | Status: DC
  Administered 2019-10-18 – 2019-10-21 (×4): 500 mg via ORAL
  Filled 2019-10-17 (×6): qty 1

## 2019-10-17 MED ORDER — CEPHALEXIN 250 MG PO CAPS
500.0000 mg | ORAL_CAPSULE | Freq: Four times a day (QID) | ORAL | Status: DC
  Administered 2019-10-18 – 2019-10-21 (×13): 500 mg via ORAL
  Filled 2019-10-17 (×13): qty 2

## 2019-10-17 MED ORDER — SODIUM CHLORIDE 0.9 % IV SOLN
1.0000 g | Freq: Once | INTRAVENOUS | Status: AC
  Administered 2019-10-17: 1 g via INTRAVENOUS
  Filled 2019-10-17: qty 10

## 2019-10-17 MED ORDER — ATORVASTATIN CALCIUM 80 MG PO TABS
80.0000 mg | ORAL_TABLET | Freq: Every day | ORAL | Status: DC
  Administered 2019-10-18 – 2019-10-21 (×4): 80 mg via ORAL
  Filled 2019-10-17 (×4): qty 1

## 2019-10-17 MED ORDER — LOSARTAN POTASSIUM 50 MG PO TABS
25.0000 mg | ORAL_TABLET | Freq: Every morning | ORAL | Status: DC
  Administered 2019-10-18 – 2019-10-21 (×4): 25 mg via ORAL
  Filled 2019-10-17 (×4): qty 1

## 2019-10-17 MED ORDER — CEPHALEXIN 250 MG PO CAPS
500.0000 mg | ORAL_CAPSULE | Freq: Four times a day (QID) | ORAL | Status: DC
  Filled 2019-10-17: qty 2

## 2019-10-17 MED ORDER — AMLODIPINE BESYLATE 5 MG PO TABS
10.0000 mg | ORAL_TABLET | Freq: Every day | ORAL | Status: DC
  Administered 2019-10-18 – 2019-10-21 (×4): 10 mg via ORAL
  Filled 2019-10-17 (×4): qty 2

## 2019-10-17 MED ORDER — CEPHALEXIN 250 MG PO CAPS
500.0000 mg | ORAL_CAPSULE | Freq: Four times a day (QID) | ORAL | Status: DC
  Administered 2019-10-17: 500 mg via ORAL
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
## 2019-10-17 NOTE — NC FL2 (Signed)
[place] LEVEL OF CARE SCREENING TOOL     IDENTIFICATION  Patient Name: Lisa [name] Birthdate: [birth year] Sex: female Admission Date (Current Location): 10/17/2019  [facility] and [place] Number:  Herbalist and Address:         Provider Number: [phone] [id number]  Attending Physician Name and Address:  No att. providers found  Relative Name and Phone Number:  Alexis Goodell [phone]    Current Level of Care: Other (Comment) (Emergency department) Recommended Level of Care: [place] Prior Approval Number:    Date Approved/Denied:   PASRR Number: [id number]  Discharge Plan: SNF    Current Diagnoses: Patient Active Problem List   Diagnosis Date Noted  . Lower leg DVT (deep venous thromboembolism), acute, bilateral ([place])   . Acute deep vein thrombosis (DVT) of proximal vein of both lower extremities (HCC)   . Symptomatic anemia   . Weakness generalized   . Aphasia   . Palliative care by specialist   . DNR (do not resuscitate) discussion   . 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 Lacunar infarct, acute ([place]) 09/17/2019  . Acute blood loss anemia 09/17/2019  . Pressure injury of skin 09/07/2019  . Gross hematuria 09/01/2019  . Hematuria 08/31/2019  . 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 Stenosis of carotid artery   . Abnormality of aortic valve   . DM (diabetes mellitus), type 2, uncontrolled w/neurologic complication ([place])   . Hypercholesteremia   . CVA (cerebral vascular accident) ([place]) 08/12/2019  . Acute ischemic stroke ([place])   . Hypertension     Orientation RESPIRATION BLADDER Height & Weight     Self, Time, Situation, Place  Normal Continent Weight:   Height:     BEHAVIORAL SYMPTOMS/MOOD NEUROLOGICAL BOWEL NUTRITION STATUS      Continent    AMBULATORY STATUS COMMUNICATION OF NEEDS Skin   Limited Assist Verbally                          Personal Care Assistance Level of Assistance  Bathing, Dressing Bathing Assistance: Limited assistance Feeding assistance: Limited assistance Dressing Assistance: Limited assistance     Functional Limitations Info    Sight Info: Adequate Hearing Info: Adequate Speech Info: Adequate    SPECIAL CARE FACTORS FREQUENCY  PT (By licensed PT), OT (By licensed OT)     PT Frequency: 5xs OT Frequency: 5xs            Contractures Contractures Info: Not present    Additional Factors Info    Code Status Info: Full code Allergies Info: NKDA           Current Medications (10/17/2019):  This is the current hospital active medication list No current facility-administered medications for this encounter.   Current Outpatient Medications  Medication Sig Dispense Refill  . amLODipine (NORVASC) 10 MG tablet Take 1 tablet (10 mg total) by mouth daily. (Patient taking differently: Take 10 mg by mouth in the morning. ) 30 tablet 0  . aspirin EC 81 MG EC tablet Take 1 tablet (81 mg total) by mouth daily. Swallow whole. 30 tablet 0  . atorvastatin (LIPITOR) 80 MG tablet Take 1 tablet (80 mg total) by mouth daily. (Patient taking differently: Take 80 mg by mouth at bedtime. ) 30 tablet 0  . losartan (COZAAR) 25 MG tablet Take 25 mg by mouth in the morning.     Marland Kitchen  metFORMIN (GLUCOPHAGE XR) 500 MG 24 hr tablet Take 1 tablet (500 mg total) by mouth daily with breakfast. 30 tablet 0  . MULTIPLE VITAMIN PO Take 1 tablet by mouth daily.       Discharge Medications: Please see discharge summary for a list of discharge medications.  Relevant Imaging Results:  Relevant Lab Results:   Additional Information    Andria Frames, [place] Work
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
## 2019-10-17 NOTE — Care Management (Signed)
Baird Kay called in - she can be a point of contact for community Care Freight forwarder, she would like to see her get to [facility]. , FL2 already in and  [facility] part of list. Call if anyone has concerns. [phone] she can help facilitate placement etc.

## 2019-10-17 NOTE — ED Notes (Signed)
Pt refusing IV ABX and PIV at this time
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
## 2019-10-17 NOTE — Progress Notes (Cosign Needed)
Patient has been referred for SNF. TOC will continue to follow up for bed offers.      10/17/19 1741  TOC ED Mini Assessment  TOC Time spent with patient (minutes): 20  Barriers to Discharge SNF Pending bed offer  Key Contact 1 Alexis Goodell [phone])

## 2019-10-17 NOTE — Care Management (Signed)
Sister in law Lisa [name]  Called as the patient had called her stating she needed to lie down.  Spoke to triage RN about a recliner, there is a possibility of a bed opening up shortly, but did meet with the patient and speak to her about the process of SNF in the ED . That we had critical patients here, and that we would do our best to keep her comfortable, patient assisted into recliner by tech. Asking when she will be seen" Im not sick" can I get somewhere to lay down" Reclined head, positioned patient in chair.

## 2019-10-18 DIAGNOSIS — N390 Urinary tract infection, site not specified: Secondary | ICD-10-CM | POA: Diagnosis not present

## 2019-10-18 LAB — CBC WITH DIFFERENTIAL/PLATELET
Abs Immature Granulocytes: 0.04 10*3/uL (ref 0.00–0.07)
Basophils Absolute: 0 10*3/uL (ref 0.0–0.1)
Basophils Relative: 0 %
Eosinophils Absolute: 0.2 10*3/uL (ref 0.0–0.5)
Eosinophils Relative: 2 %
HCT: 32.4 % — ABNORMAL LOW (ref 36.0–46.0)
Hemoglobin: 10.1 g/dL — ABNORMAL LOW (ref 12.0–15.0)
Immature Granulocytes: 0 %
Lymphocytes Relative: 14 %
Lymphs Abs: 1.7 10*3/uL (ref 0.7–4.0)
MCH: 27 pg (ref 26.0–34.0)
MCHC: 31.2 g/dL (ref 30.0–36.0)
MCV: 86.6 fL (ref 80.0–100.0)
Monocytes Absolute: 0.8 10*3/uL (ref 0.1–1.0)
Monocytes Relative: 7 %
Neutro Abs: 9.6 10*3/uL — ABNORMAL HIGH (ref 1.7–7.7)
Neutrophils Relative %: 77 %
Platelets: 352 10*3/uL (ref 150–400)
RBC: 3.74 MIL/uL — ABNORMAL LOW (ref 3.87–5.11)
RDW: 16 % — ABNORMAL HIGH (ref 11.5–15.5)
WBC: 12.4 10*3/uL — ABNORMAL HIGH (ref 4.0–10.5)
nRBC: 0 % (ref 0.0–0.2)

## 2019-10-18 LAB — BASIC METABOLIC PANEL
Anion gap: 11 (ref 5–15)
BUN: 10 mg/dL (ref 8–23)
CO2: 25 mmol/L (ref 22–32)
Calcium: 9.1 mg/dL (ref 8.9–10.3)
Chloride: 98 mmol/L (ref 98–111)
Creatinine, Ser: 0.49 mg/dL (ref 0.44–1.00)
GFR calc Af Amer: >60 mL/min (ref >60)
GFR calc non Af Amer: >60 mL/min (ref >60)
Glucose, Bld: 120 mg/dL — ABNORMAL HIGH (ref 70–99)
Potassium: 3.7 mmol/L (ref 3.5–5.1)
Sodium: 134 mmol/L — ABNORMAL LOW (ref 135–145)

## 2019-10-18 MED ORDER — LORAZEPAM 1 MG PO TABS
0.5000 mg | ORAL_TABLET | Freq: Once | ORAL | Status: AC
  Administered 2019-10-18: 0.5 mg via ORAL
  Filled 2019-10-18: qty 1
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
## 2019-10-18 NOTE — ED Provider Notes (Signed)
Emergency Medicine Observation Re-evaluation Note  Lisa [name] is a 69 y.o. female, seen on rounds today.  Pt initially presented to the ED for complaints of SOCIAL WORKER CONSULT Currently, the patient is resting in the hall and eating lunch.  She has no complaints at this time.  Physical Exam  BP (!) 146/87 (BP Location: Right Arm)   Pulse 99   Temp 99.1 F (37.3 C) (Oral)   Resp 18   SpO2 99%  Physical Exam General: NAD Cardiac: normal rate Lungs: no resp distress Psych: calm and cooperative  ED Course / MDM  EKG:    I have reviewed the labs performed to date as well as medications administered while in observation.  Recent changes in the last 24 hours include no change.  Plan  Current plan is for SNF placement.  Waiting to hear back from [facility] for pending bed.  Pt currently needs to complete 1 week of Keflex for UTI.  Urine culture still pending and needs follow up tomorrow for possible sensitivities.   Blanchie Dessert, MD 10/18/19 1147

## 2019-10-18 NOTE — TOC Progression Note (Signed)
Transition of Care [facility]) - Progression Note    Patient Details  Name: Lisa [name] MRN: [id number] Date of Birth: [birth year]  Transition of Care [facility]) CM/SW [place], RN Phone Number: 10/18/2019, 10:35 AM  Clinical Narrative:    Damaris Schooner to Olen Cordial at [facility]. Sent over CSW notes. Already Had FL2. He will get in touch with the admissions coordinator to check on  Where we are with placement.   Expected Discharge Plan: Skilled Nursing Facility Barriers to Discharge: SNF Pending bed offer  Expected Discharge Plan and Services Expected Discharge Plan: [place] In-house Referral: Clinical Social Work Discharge Planning Services: CM Consult   Living arrangements for the past 2 months: [place]                                       Social Determinants of Health (SDOH) Interventions    Readmission Risk Interventions No flowsheet data found.
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
## 2019-10-18 NOTE — Care Management (Signed)
PT paged called to see when they would assess patient. They responded back and the patient  Has  been assigned
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
## 2019-10-18 NOTE — Care Management (Signed)
Called [facility], spoke to [place], He stated he is covering and is not familiar with all the items needed for authorization but will forward n otes to the admissions manager if I fax them over. Flw, MD progress, MD initial faxed to [place] at [facility]. Patient is agreeable to go to [facility], and gave permission to fax information Will call [facility] for authorization per [facility]. Asking myself to do . Marland Kitchen

## 2019-10-18 NOTE — TOC Progression Note (Signed)
Transition of Care [facility]) - Progression Note    Patient Details  Name: Lisa [name] MRN: [id number] Date of Birth: 1950-06-18  Transition of Care [facility]) CM/SW [place], RN Phone Number: 10/18/2019, 3:19 PM  Clinical Narrative:    [facility] turned patient down at this time, however, will re-evaluate tomorrow. Laymond Purser CM (Private) is working in conjunction with [place] to assist patient in this process at the request of the patient and sister. Have talked with Hassan Rowan today and updated her on the situation and latest with [facility].    Expected Discharge Plan: Skilled Nursing Facility Barriers to Discharge: SNF Pending bed offer  Expected Discharge Plan and Services Expected Discharge Plan: [place] In-house Referral: Clinical Social Work Discharge Planning Services: CM Consult   Living arrangements for the past 2 months: [place]                                       Social Determinants of Health (SDOH) Interventions    Readmission Risk Interventions No flowsheet data found.

## 2019-10-19 DIAGNOSIS — N390 Urinary tract infection, site not specified: Secondary | ICD-10-CM | POA: Diagnosis not present

## 2019-10-19 MED ORDER — ACETAMINOPHEN 500 MG PO TABS: 500.0000 mg | ORAL_TABLET | Freq: Four times a day (QID) | ORAL | Status: DC | PRN

## 2019-10-19 MED ORDER — OXYCODONE-ACETAMINOPHEN 5-325 MG PO TABS
1.0000 | ORAL_TABLET | Freq: Four times a day (QID) | ORAL | Status: DC | PRN
  Administered 2019-10-19: 1 via ORAL
  Filled 2019-10-19: qty 1

## 2019-10-19 NOTE — ED Provider Notes (Signed)
Emergency Medicine Observation Re-evaluation Note  Lisa [name] is a 69 y.o. female, seen on rounds today.  Pt initially presented to the ED for complaints of SOCIAL WORKER CONSULT Currently, the patient is resting in bed in the hallway.  Physical Exam  BP (!) 146/87 (BP Location: Right Arm)   Pulse 99   Temp 99.1 F (37.3 C) (Oral)   Resp 18   SpO2 99%  Physical Exam General: well appearing, calm Cardiac: well perfused Lungs: even and unlabored Psych: calm  ED Course / MDM  EKG:    I have reviewed the labs performed to date as well as medications administered while in observation.  Recent changes in the last 24 hours include   Per SW/CM note yesterday: "[facility] turned patient down at this time, however, will re-evaluate tomorrow. Laymond Purser CM (Private) is working in conjunction with [place] to assist patient in this process at the request of the patient and sister. Have talked with Hassan Rowan today and updated her on the situation and latest with [facility]."  PT recommends - "24 hour;SNF" .  Plan  Current plan is for SNF placement, placement per SW/CM.    Lucrezia Starch, MD 10/19/19 0730

## 2019-10-19 NOTE — ED Notes (Addendum)
Attempted to ambulate pt.  Pt refused.  Pt's gown and bedding changed.  Diarrhea noted.

## 2019-10-19 NOTE — Progress Notes (Addendum)
12:30pm: CSW received return call from patient's sister-in-law's and HCPOA [name] and Rod Holler who are requesting CSW speak with Baird Kay, a privately hired individual of [facility] who is assisting the family with obtaining placement for this patient.   CSW spoke with Hassan Rowan at [phone] who states she spoke with Olivia Mackie at [facility] yesterday. CSW informed Hassan Rowan that attempts have been made to contact [facility] today without success and that the patient was denied in the [place]. Olivia Mackie returned call to [facility] who confirmed the patient would not be offered a bed.  12pm: CSW attempted to reach patient's sister in law and HCPOA [name] without success - a voicemail was left requesting a return call.  11am: CSW met with patient at bedside to present her with the bed offer from [facility], patient denied the offer and stated she only wanted to go to [facility] in [place].  CSW spoke Judson Roch at [facility] who states the facility is unable to accept any patient's at this time due to a COVID outbreak.  Patient reports she received both Moderna COVID vaccines in April at the [facility] but states she does not have her vaccine card.  Madilyn Fireman, MSW, LCSW-A Transitions of Care  Clinical Social Worker  [facility] Emergency Departments  Medical ICU [phone]

## 2019-10-20 DIAGNOSIS — N390 Urinary tract infection, site not specified: Secondary | ICD-10-CM | POA: Diagnosis not present

## 2019-10-20 LAB — URINE CULTURE: Culture: >=100000 — AB

## 2019-10-20 MED ORDER — ENSURE ENLIVE PO LIQD
237.0000 mL | Freq: Two times a day (BID) | ORAL | Status: DC
  Administered 2019-10-20 – 2019-10-21 (×2): 237 mL via ORAL
  Filled 2019-10-20 (×2): qty 237

## 2019-10-20 NOTE — ED Notes (Signed)
Woke pt to take vitals and give medication. She is not oriented to self, unable to tell me her name.  She was cooperative and did comply w/ medications.
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
## 2019-10-21 DIAGNOSIS — I82409 Acute embolism and thrombosis of unspecified deep veins of unspecified lower extremity: Secondary | ICD-10-CM | POA: Diagnosis not present

## 2019-10-21 DIAGNOSIS — Z8673 Personal history of transient ischemic attack (TIA), and cerebral infarction without residual deficits: Secondary | ICD-10-CM | POA: Diagnosis not present

## 2019-10-21 DIAGNOSIS — I779 Disorder of arteries and arterioles, unspecified: Secondary | ICD-10-CM | POA: Diagnosis not present

## 2019-10-21 DIAGNOSIS — Z87891 Personal history of nicotine dependence: Secondary | ICD-10-CM | POA: Diagnosis not present

## 2019-10-21 DIAGNOSIS — E118 Type 2 diabetes mellitus with unspecified complications: Secondary | ICD-10-CM | POA: Diagnosis not present

## 2019-10-21 DIAGNOSIS — Z7401 Bed confinement status: Secondary | ICD-10-CM | POA: Diagnosis not present

## 2019-10-21 DIAGNOSIS — Z7984 Long term (current) use of oral hypoglycemic drugs: Secondary | ICD-10-CM | POA: Diagnosis not present

## 2019-10-21 DIAGNOSIS — C678 Malignant neoplasm of overlapping sites of bladder: Secondary | ICD-10-CM | POA: Diagnosis not present

## 2019-10-21 DIAGNOSIS — N390 Urinary tract infection, site not specified: Secondary | ICD-10-CM | POA: Diagnosis not present

## 2019-10-21 DIAGNOSIS — T83511A Infection and inflammatory reaction due to indwelling urethral catheter, initial encounter: Secondary | ICD-10-CM | POA: Diagnosis not present

## 2019-10-21 DIAGNOSIS — E119 Type 2 diabetes mellitus without complications: Secondary | ICD-10-CM | POA: Diagnosis not present

## 2019-10-21 DIAGNOSIS — C679 Malignant neoplasm of bladder, unspecified: Secondary | ICD-10-CM | POA: Diagnosis not present

## 2019-10-21 DIAGNOSIS — R339 Retention of urine, unspecified: Secondary | ICD-10-CM | POA: Diagnosis not present

## 2019-10-21 DIAGNOSIS — I10 Essential (primary) hypertension: Secondary | ICD-10-CM | POA: Diagnosis not present

## 2019-10-21 DIAGNOSIS — R6889 Other general symptoms and signs: Secondary | ICD-10-CM | POA: Diagnosis not present

## 2019-10-21 DIAGNOSIS — M2550 Pain in unspecified joint: Secondary | ICD-10-CM | POA: Diagnosis not present

## 2019-10-21 DIAGNOSIS — Z466 Encounter for fitting and adjustment of urinary device: Secondary | ICD-10-CM | POA: Diagnosis not present

## 2019-10-21 DIAGNOSIS — Z7982 Long term (current) use of aspirin: Secondary | ICD-10-CM | POA: Diagnosis not present

## 2019-10-21 DIAGNOSIS — R4701 Aphasia: Secondary | ICD-10-CM | POA: Diagnosis not present

## 2019-10-21 DIAGNOSIS — Z743 Need for continuous supervision: Secondary | ICD-10-CM | POA: Diagnosis not present

## 2019-10-21 DIAGNOSIS — R338 Other retention of urine: Secondary | ICD-10-CM | POA: Diagnosis not present

## 2019-10-21 DIAGNOSIS — Z79899 Other long term (current) drug therapy: Secondary | ICD-10-CM | POA: Diagnosis not present

## 2019-10-21 DIAGNOSIS — Z86718 Personal history of other venous thrombosis and embolism: Secondary | ICD-10-CM | POA: Diagnosis not present

## 2019-10-21 DIAGNOSIS — D649 Anemia, unspecified: Secondary | ICD-10-CM | POA: Diagnosis not present

## 2019-10-21 DIAGNOSIS — N139 Obstructive and reflux uropathy, unspecified: Secondary | ICD-10-CM | POA: Diagnosis not present

## 2019-10-21 DIAGNOSIS — I679 Cerebrovascular disease, unspecified: Secondary | ICD-10-CM | POA: Diagnosis not present

## 2019-10-21 LAB — SARS CORONAVIRUS 2 BY RT PCR (HOSPITAL ORDER, PERFORMED IN ~~LOC~~ HOSPITAL LAB): SARS Coronavirus 2: NEGATIVE

## 2019-10-21 NOTE — ED Provider Notes (Signed)
Emergency Medicine Observation Re-evaluation Note  Lisa [name] is a 69 y.o. female, seen on rounds today.  Pt initially presented to the ED for complaints of SOCIAL WORKER CONSULT Currently, the patient is awaiting placement.  Physical Exam  BP 116/74   Pulse 88   Temp 98.5 F (36.9 C) (Oral)   Resp 16   SpO2 99%  Physical Exam General: no acute distress  ED Course / MDM  EKG:    I have reviewed the labs performed to date as well as medications administered while in observation.  Recent changes in the last 24 hours include NONE.  Plan  Current plan is for Placement. Patient is not under full IVC at this time.   Sherwood Gambler, MD 10/21/19 [phone]

## 2019-10-21 NOTE — ED Notes (Signed)
Patient wanted to speak with Rod Holler. Called [phone]. Rod Holler unable to speak with patient at this time. Did stated attempting to find record of patient receiving COVID vaccination. Patient stated received the vaccine at the [facility]

## 2019-10-21 NOTE — Progress Notes (Signed)
CSW received call from [facility], sister-in-law who wishes to be of help should Pt be uncooperative. [name] says to call her @ [phone] or [phone] and she will speak with Pt of needed.  [name] would also like to be notified when Pt is transferred to [place].
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
## 2019-10-21 NOTE — Progress Notes (Addendum)
2:50pm: Patient will go to room 213 at [facility]. Patient will be transported via [facility], patient can go to facility after the COVID test results. The number to call for report is [phone].  1:20pm: CSW received call from Rod Holler requesting that the patient call her sister in law Satartia. CSW informed RN of request. Rod Holler is agreeable to accept the bed offer from [facility].  CSW spoke with Chantell at [facility] to inform her of the offer acceptance.   CSW spoke with Dr. Regenia Skeeter to request he order a rapid COVID test for this patient.  12pm: CSW spoke with patient's Earleen Newport to discuss bed offers from [facility]. CSW informed Rod Holler that per Hassan Rowan [facility] would not be offering the patient a bed. Rod Holler is adamant that the patient go to [facility] and is going to speak with the facility administrator for clarity. CSW offered for Rod Holler to come visit the patient at the hospital but she refused the offer. Rod Holler to return call to [facility] shortly.  9:45am: CSW spoke with Abigail Butts at [facility] to request a review of this patient - Abigail Butts unable to offer the patient a bed at this time.  CSW spoke with Juliann Pulse at [facility] who has rescinded the bed offer for this patient.  CSW spoke with Hassan Rowan of [facility] who states Rod Holler [facility]) has had further discussion with admissions at [facility] regarding a possible admission to the facility on Friday. Hassan Rowan to speak with admissions staff at [facility] and will return call to [place].  Madilyn Fireman, MSW, LCSW-A Transitions of Care  Clinical Social Worker  [facility] Emergency Departments  Medical ICU [phone]

## 2019-10-21 NOTE — Progress Notes (Signed)
Covid negative test result has been faxed to [facility]. CSW informed RN on staff.  Per previous CSW note:  Patient will go to room 213 at [facility]. Patient will be transported via [facility], patient can go to facility after the COVID test results. The number to call for report is [phone].

## 2019-10-21 NOTE — ED Notes (Signed)
Patient states will take medication when the Ensure is brought. Waiting for paharmacy to deliver.

## 2019-10-21 NOTE — ED Notes (Signed)
1065ml dark urine from foley bag emptied.

## 2019-10-23 ENCOUNTER — Telehealth: Payer: Self-pay

## 2019-10-23 NOTE — Telephone Encounter (Signed)
PT with [facility] called to let us know pt has not been answering door to let him in or answering calls. They are going to have to d/c her.

## 2019-10-26 DIAGNOSIS — I82409 Acute embolism and thrombosis of unspecified deep veins of unspecified lower extremity: Secondary | ICD-10-CM | POA: Diagnosis not present

## 2019-10-26 DIAGNOSIS — C679 Malignant neoplasm of bladder, unspecified: Secondary | ICD-10-CM | POA: Diagnosis not present

## 2019-10-26 DIAGNOSIS — N139 Obstructive and reflux uropathy, unspecified: Secondary | ICD-10-CM | POA: Diagnosis not present

## 2019-10-28 DIAGNOSIS — C678 Malignant neoplasm of overlapping sites of bladder: Secondary | ICD-10-CM | POA: Diagnosis not present

## 2019-10-28 DIAGNOSIS — R338 Other retention of urine: Secondary | ICD-10-CM | POA: Diagnosis not present

## 2019-11-02 DIAGNOSIS — R339 Retention of urine, unspecified: Secondary | ICD-10-CM | POA: Diagnosis not present

## 2019-11-02 DIAGNOSIS — C679 Malignant neoplasm of bladder, unspecified: Secondary | ICD-10-CM | POA: Diagnosis not present

## 2019-11-09 DIAGNOSIS — I6529 Occlusion and stenosis of unspecified carotid artery: Secondary | ICD-10-CM | POA: Diagnosis not present

## 2019-11-09 DIAGNOSIS — C679 Malignant neoplasm of bladder, unspecified: Secondary | ICD-10-CM | POA: Diagnosis not present

## 2019-11-09 DIAGNOSIS — R4701 Aphasia: Secondary | ICD-10-CM | POA: Diagnosis not present

## 2019-11-09 DIAGNOSIS — D649 Anemia, unspecified: Secondary | ICD-10-CM | POA: Diagnosis not present

## 2019-11-09 DIAGNOSIS — E785 Hyperlipidemia, unspecified: Secondary | ICD-10-CM | POA: Diagnosis not present

## 2019-11-10 DIAGNOSIS — I69398 Other sequelae of cerebral infarction: Secondary | ICD-10-CM | POA: Diagnosis not present

## 2019-11-10 DIAGNOSIS — M6281 Muscle weakness (generalized): Secondary | ICD-10-CM | POA: Diagnosis not present

## 2019-11-10 DIAGNOSIS — R229 Localized swelling, mass and lump, unspecified: Secondary | ICD-10-CM | POA: Diagnosis not present

## 2019-11-10 DIAGNOSIS — D492 Neoplasm of unspecified behavior of bone, soft tissue, and skin: Secondary | ICD-10-CM | POA: Diagnosis not present

## 2019-11-10 DIAGNOSIS — Z87891 Personal history of nicotine dependence: Secondary | ICD-10-CM | POA: Diagnosis not present

## 2019-11-10 DIAGNOSIS — I6523 Occlusion and stenosis of bilateral carotid arteries: Secondary | ICD-10-CM | POA: Diagnosis not present

## 2019-11-10 DIAGNOSIS — R0989 Other specified symptoms and signs involving the circulatory and respiratory systems: Secondary | ICD-10-CM | POA: Diagnosis not present

## 2019-11-10 DIAGNOSIS — E119 Type 2 diabetes mellitus without complications: Secondary | ICD-10-CM | POA: Diagnosis not present

## 2019-11-10 DIAGNOSIS — R590 Localized enlarged lymph nodes: Secondary | ICD-10-CM | POA: Diagnosis not present

## 2019-11-11 DIAGNOSIS — M6281 Muscle weakness (generalized): Secondary | ICD-10-CM | POA: Diagnosis not present

## 2019-11-11 DIAGNOSIS — Z87891 Personal history of nicotine dependence: Secondary | ICD-10-CM | POA: Diagnosis not present

## 2019-11-11 DIAGNOSIS — I69398 Other sequelae of cerebral infarction: Secondary | ICD-10-CM | POA: Diagnosis not present

## 2019-11-12 DIAGNOSIS — M6281 Muscle weakness (generalized): Secondary | ICD-10-CM | POA: Diagnosis not present

## 2019-11-12 DIAGNOSIS — Z87891 Personal history of nicotine dependence: Secondary | ICD-10-CM | POA: Diagnosis not present

## 2019-11-12 DIAGNOSIS — E785 Hyperlipidemia, unspecified: Secondary | ICD-10-CM | POA: Diagnosis not present

## 2019-11-12 DIAGNOSIS — R531 Weakness: Secondary | ICD-10-CM | POA: Diagnosis not present

## 2019-11-12 DIAGNOSIS — R221 Localized swelling, mass and lump, neck: Secondary | ICD-10-CM | POA: Diagnosis not present

## 2019-11-12 DIAGNOSIS — I714 Abdominal aortic aneurysm, without rupture: Secondary | ICD-10-CM | POA: Diagnosis not present

## 2019-11-12 DIAGNOSIS — I10 Essential (primary) hypertension: Secondary | ICD-10-CM | POA: Diagnosis not present

## 2019-11-12 DIAGNOSIS — I69398 Other sequelae of cerebral infarction: Secondary | ICD-10-CM | POA: Diagnosis not present

## 2019-11-12 DIAGNOSIS — E042 Nontoxic multinodular goiter: Secondary | ICD-10-CM | POA: Diagnosis not present

## 2019-11-12 DIAGNOSIS — E119 Type 2 diabetes mellitus without complications: Secondary | ICD-10-CM | POA: Diagnosis not present

## 2019-11-12 DIAGNOSIS — C679 Malignant neoplasm of bladder, unspecified: Secondary | ICD-10-CM | POA: Diagnosis not present

## 2019-11-12 DIAGNOSIS — I700 Atherosclerosis of aorta: Secondary | ICD-10-CM | POA: Diagnosis not present

## 2019-11-12 DIAGNOSIS — R269 Unspecified abnormalities of gait and mobility: Secondary | ICD-10-CM | POA: Diagnosis not present

## 2019-11-13 DIAGNOSIS — I679 Cerebrovascular disease, unspecified: Secondary | ICD-10-CM | POA: Diagnosis not present

## 2019-11-13 DIAGNOSIS — M6281 Muscle weakness (generalized): Secondary | ICD-10-CM | POA: Diagnosis not present

## 2019-11-13 DIAGNOSIS — I69820 Aphasia following other cerebrovascular disease: Secondary | ICD-10-CM | POA: Diagnosis not present

## 2019-11-13 DIAGNOSIS — Z87891 Personal history of nicotine dependence: Secondary | ICD-10-CM | POA: Diagnosis not present

## 2019-11-16 DIAGNOSIS — C678 Malignant neoplasm of overlapping sites of bladder: Secondary | ICD-10-CM | POA: Diagnosis not present

## 2019-11-16 DIAGNOSIS — Z5111 Encounter for antineoplastic chemotherapy: Secondary | ICD-10-CM | POA: Diagnosis not present

## 2019-11-17 DIAGNOSIS — I69820 Aphasia following other cerebrovascular disease: Secondary | ICD-10-CM | POA: Diagnosis not present

## 2019-11-17 DIAGNOSIS — I679 Cerebrovascular disease, unspecified: Secondary | ICD-10-CM | POA: Diagnosis not present

## 2019-11-17 DIAGNOSIS — M6281 Muscle weakness (generalized): Secondary | ICD-10-CM | POA: Diagnosis not present

## 2019-11-17 DIAGNOSIS — Z87891 Personal history of nicotine dependence: Secondary | ICD-10-CM | POA: Diagnosis not present

## 2019-11-18 ENCOUNTER — Ambulatory Visit: Payer: Medicare Other | Admitting: Oncology

## 2019-11-18 DIAGNOSIS — E119 Type 2 diabetes mellitus without complications: Secondary | ICD-10-CM | POA: Diagnosis not present

## 2019-11-18 DIAGNOSIS — Z87891 Personal history of nicotine dependence: Secondary | ICD-10-CM | POA: Diagnosis not present

## 2019-11-18 DIAGNOSIS — R945 Abnormal results of liver function studies: Secondary | ICD-10-CM | POA: Diagnosis not present

## 2019-11-18 DIAGNOSIS — M6281 Muscle weakness (generalized): Secondary | ICD-10-CM | POA: Diagnosis not present

## 2019-11-18 DIAGNOSIS — I69820 Aphasia following other cerebrovascular disease: Secondary | ICD-10-CM | POA: Diagnosis not present

## 2019-11-18 DIAGNOSIS — I679 Cerebrovascular disease, unspecified: Secondary | ICD-10-CM | POA: Diagnosis not present

## 2019-11-18 NOTE — Progress Notes (Deleted)
Cardiology Office Note:    Date:  11/18/2019   ID:  Lisa [name], DOB Apr 09, 1950, MRN [id number]  PCP:  Patient, No Pcp Per  Cardiologist:  Donato Heinz, MD  Electrophysiologist:  None   Referring MD: No ref. provider found   No chief complaint on file. ***  History of Present Illness:    Lisa [name] is a 69 y.o. female with a hx of bladder cancer, DVTs, CVA, hypertension, hyperlipidemia, type 2 diabetes who presents for follow-up.  She was admitted to [facility] on 08/12/2019 with left MCA infarct.  She was diagnosed with type 2 diabetes during admission (A1c 8.2).Marland Kitchen  Found to have significant left ICA stenosis on CTA head and neck.  Carotid Doppler showed 80 to 99% left ICA stenosis.  Echocardiogram on 08/13/2019 showed EF 60 to 65%, moderate LVH, grade 1 diastolic dysfunction, normal RV function, probable linear mobile echodensity measuring 0.6 cm on the ventricular aspect of the aortic valve that could represent PFE, Lambl's excrescence, or artifact from adjacent focal calcification of noncoronary cusp.  TEE was recommended, but patient left AMA prior to procedure.  She underwent an left transcarotid artery stenting on 09/18/7670 without complication.  She was subsequently admitted to [facility] from 7/19 through 09/07/2019 with gross hematuria, found to be due to bladder cancer.  Subsequently was admitted to [facility] from 8/5 through 10/03/2019 with a acute blood loss anemia due to hematuria from bladder cancer, hemoglobin down to 6.9 on admission.  Required total of 9 units PRBCs while admitted.  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  Also was found to have acute to subacute left lacunar stroke.  Plavix was held due to active bleeding in vascular surgery recommended continuing just aspirin.  Repeat echocardiogram during admission did not show echodensity.  TEE was postponed.  Patient declined CTA imaging but there was concern for PE.  Palliative medicine was consulted given malignancy, anemia, bilateral  DVTs; it was decided to continue medical therapy.  She was admitted again from 8/23 through 10/14/2019 with symptomatic anemia due to hematuria.  IVC filter was placed and heparin was discontinued.  She was continued on aspirin.   Past Medical History:  Diagnosis Date  . Carotid arterial disease ([place]) 07/2019  . Diabetes (Lisa [name])   . HTN (hypertension)   . Hyperlipidemia   . Stroke [facility])    acute/subacute left MCA infarct 08/12/19    Past Surgical History:  Procedure Laterality Date  . CYSTOSCOPY WITH FULGERATION N/A 09/01/2019   Procedure: CYSTOSCOPY CLOT EVACUATION WITH POSSIBLE FULGERATION;  Surgeon: Robley Fries, MD;  Location: [facility];  Service: Urology;  Laterality: N/A;  . CYSTOSCOPY WITH FULGERATION N/A 09/28/2019   Procedure: [place] AND CLOT EVACUATION;  Surgeon: Robley Fries, MD;  Location: [facility];  Service: Urology;  Laterality: N/A;  . IR IVC FILTER PLMT / S&I Burke Keels GUID/MOD SED  10/07/2019  . TONSILLECTOMY    . TRANSCAROTID ARTERY REVASCULARIZATION Left 08/20/2019   Procedure: LEFT TRANSCAROTID ARTERY REVASCULARIZATION;  Surgeon: Waynetta Sandy, MD;  Location: [facility];  Service: Vascular;  Laterality: Left;  . TRANSURETHRAL RESECTION OF BLADDER TUMOR WITH MITOMYCIN-C N/A 09/22/2019   Procedure: TRANSURETHRAL RESECTION OF BLADDER TUMOR WITH INTRAVESICAL GEMCITABINE;  Surgeon: Robley Fries, MD;  Location: [facility];  Service: Urology;  Laterality: N/A;  90 MINS    Current Medications: No outpatient medications have been marked as taking for the 11/19/19 encounter (Appointment) with Donato Heinz, MD.  Allergies:   Patient has no known allergies.   Social History   Socioeconomic History  . Marital status: Married    Spouse name: Not on file  . Number of children: Not on file  . Years of education: Not on file  . Highest education level: Not on file  Occupational History  . Not on file  Tobacco Use  . Smoking status: Former  Smoker    Packs/day: 0.50    Types: Cigarettes    Quit date: 07/28/2019    Years since quitting: 0.3  . Smokeless tobacco: Never Used  Vaping Use  . Vaping Use: Never used  Substance and Sexual Activity  . Alcohol use: Not Currently  . Drug use: Never  . Sexual activity: Yes  Other Topics Concern  . Not on file  Social History Narrative  . Not on file   Social Determinants of Health   Financial Resource Strain:   . Difficulty of Paying Living Expenses: Not on file  Food Insecurity:   . Worried About Charity fundraiser in the Last Year: Not on file  . Ran Out of Food in the Last Year: Not on file  Transportation Needs:   . Lack of Transportation (Medical): Not on file  . Lack of Transportation (Non-Medical): Not on file  Physical Activity:   . Days of Exercise per Week: Not on file  . Minutes of Exercise per Session: Not on file  Stress:   . Feeling of Stress : Not on file  Social Connections:   . Frequency of Communication with Friends and Family: Not on file  . Frequency of Social Gatherings with Friends and Family: Not on file  . Attends Religious Services: Not on file  . Active Member of Clubs or Organizations: Not on file  . Attends Archivist Meetings: Not on file  . Marital Status: Not on file     Family History: The patient's ***family history includes Hypertension in her mother; Lung cancer in her father.  ROS:   Please see the history of present illness.    *** All other systems reviewed and are negative.  EKGs/Labs/Other Studies Reviewed:    The following studies were reviewed today: ***  EKG:  EKG is *** ordered today.  The ekg ordered today demonstrates ***  Recent Labs: 09/02/2019: Magnesium 1.5 10/05/2019: ALT 16 10/18/2019: BUN 10; Creatinine, Ser 0.49; Hemoglobin 10.1; Platelets 352; Potassium 3.7; Sodium 134  Recent Lipid Panel    Component Value Date/Time   CHOL 94 09/18/2019 0421   TRIG 82 09/18/2019 0421   HDL 32 (L) 09/18/2019  0421   CHOLHDL 2.9 09/18/2019 0421   VLDL 16 09/18/2019 0421   LDLCALC 46 09/18/2019 0421    Physical Exam:    VS:  There were no vitals taken for this visit.    Wt Readings from Last 3 Encounters:  10/06/19 176 lb 5.9 oz (80 kg)  09/27/19 177 lb 7.5 oz (80.5 kg)  09/05/19 179 lb 2 oz (81.3 kg)     GEN: *** Well nourished, well developed in no acute distress HEENT: Normal NECK: No JVD; No carotid bruits LYMPHATICS: No lymphadenopathy CARDIAC: ***RRR, no murmurs, rubs, gallops RESPIRATORY:  Clear to auscultation without rales, wheezing or rhonchi  ABDOMEN: Soft, non-tender, non-distended MUSCULOSKELETAL:  No edema; No deformity  SKIN: Warm and dry NEUROLOGIC:  Alert and oriented x 3 PSYCHIATRIC:  Normal affect   ASSESSMENT:    No diagnosis found. PLAN:    CVA:  Multiple CVAs including lacunar stroke on 09/17/2019 and left MCA infarct on 08/12/2019.  Known carotid disease status post stenting on 7/8.  Echocardiogram on 08/13/2019 showed EF 60 to 65%, moderate LVH, grade 1 diastolic dysfunction, normal RV function, probable linear mobile echodensity measuring 0.6 cm on the ventricular aspect of the aortic valve that could represent PFE, Lambl's excrescence, or artifact from adjacent focal calcification of noncoronary cusp.  TEE was recommended, but patient left AMA prior to procedure.  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  Had recurrent hematuria on anticoagulation, unable to tolerate.  IVC filter placed on 8/25  Bladder cancer: Follows with urology.  Has been complicated by recurrent hematuria causing symptomatic anemia.  Hypertension: Continue losartan 25 mg daily and amlodipine 10 mg daily  Hyperlipidemia: Continue atorvastatin 80 mg daily  T2DM: Continue Metformin  Carotid artery disease: Severe left ICA stenosis status post stenting on 7/8.  Continue aspirin alone  RTC in ***   Medication  Adjustments/Labs and Tests Ordered: Current medicines are reviewed at length with the patient today.  Concerns regarding medicines are outlined above.  No orders of the defined types were placed in this encounter.  No orders of the defined types were placed in this encounter.   There are no Patient Instructions on file for this visit.   Signed, Donato Heinz, MD  11/18/2019 10:22 PM    [facility]

## 2019-11-19 ENCOUNTER — Ambulatory Visit: Payer: Medicare Other | Admitting: Cardiology

## 2019-11-19 DIAGNOSIS — Z87891 Personal history of nicotine dependence: Secondary | ICD-10-CM | POA: Diagnosis not present

## 2019-11-19 DIAGNOSIS — I70229 Atherosclerosis of native arteries of extremities with rest pain, unspecified extremity: Secondary | ICD-10-CM | POA: Diagnosis not present

## 2019-11-19 DIAGNOSIS — M6281 Muscle weakness (generalized): Secondary | ICD-10-CM | POA: Diagnosis not present

## 2019-11-19 DIAGNOSIS — I679 Cerebrovascular disease, unspecified: Secondary | ICD-10-CM | POA: Diagnosis not present

## 2019-11-19 DIAGNOSIS — I70219 Atherosclerosis of native arteries of extremities with intermittent claudication, unspecified extremity: Secondary | ICD-10-CM | POA: Diagnosis not present

## 2019-11-19 DIAGNOSIS — I739 Peripheral vascular disease, unspecified: Secondary | ICD-10-CM | POA: Diagnosis not present

## 2019-11-19 DIAGNOSIS — I69820 Aphasia following other cerebrovascular disease: Secondary | ICD-10-CM | POA: Diagnosis not present

## 2019-11-20 DIAGNOSIS — Z87891 Personal history of nicotine dependence: Secondary | ICD-10-CM | POA: Diagnosis not present

## 2019-11-20 DIAGNOSIS — M6281 Muscle weakness (generalized): Secondary | ICD-10-CM | POA: Diagnosis not present

## 2019-11-20 DIAGNOSIS — I679 Cerebrovascular disease, unspecified: Secondary | ICD-10-CM | POA: Diagnosis not present

## 2019-11-20 DIAGNOSIS — I69820 Aphasia following other cerebrovascular disease: Secondary | ICD-10-CM | POA: Diagnosis not present

## 2019-11-22 DIAGNOSIS — Z87891 Personal history of nicotine dependence: Secondary | ICD-10-CM | POA: Diagnosis not present

## 2019-11-22 DIAGNOSIS — M6281 Muscle weakness (generalized): Secondary | ICD-10-CM | POA: Diagnosis not present

## 2019-11-22 DIAGNOSIS — I679 Cerebrovascular disease, unspecified: Secondary | ICD-10-CM | POA: Diagnosis not present

## 2019-11-22 DIAGNOSIS — I69820 Aphasia following other cerebrovascular disease: Secondary | ICD-10-CM | POA: Diagnosis not present

## 2019-11-23 DIAGNOSIS — M6281 Muscle weakness (generalized): Secondary | ICD-10-CM | POA: Diagnosis not present

## 2019-11-23 DIAGNOSIS — R8271 Bacteriuria: Secondary | ICD-10-CM | POA: Diagnosis not present

## 2019-11-23 DIAGNOSIS — C678 Malignant neoplasm of overlapping sites of bladder: Secondary | ICD-10-CM | POA: Diagnosis not present

## 2019-11-23 DIAGNOSIS — Z87891 Personal history of nicotine dependence: Secondary | ICD-10-CM | POA: Diagnosis not present

## 2019-11-23 DIAGNOSIS — I69820 Aphasia following other cerebrovascular disease: Secondary | ICD-10-CM | POA: Diagnosis not present

## 2019-11-23 DIAGNOSIS — I679 Cerebrovascular disease, unspecified: Secondary | ICD-10-CM | POA: Diagnosis not present

## 2019-11-24 DIAGNOSIS — I69820 Aphasia following other cerebrovascular disease: Secondary | ICD-10-CM | POA: Diagnosis not present

## 2019-11-24 DIAGNOSIS — Z87891 Personal history of nicotine dependence: Secondary | ICD-10-CM | POA: Diagnosis not present

## 2019-11-24 DIAGNOSIS — I679 Cerebrovascular disease, unspecified: Secondary | ICD-10-CM | POA: Diagnosis not present

## 2019-11-24 DIAGNOSIS — M6281 Muscle weakness (generalized): Secondary | ICD-10-CM | POA: Diagnosis not present

## 2019-11-25 DIAGNOSIS — D649 Anemia, unspecified: Secondary | ICD-10-CM | POA: Diagnosis not present

## 2019-11-25 DIAGNOSIS — I10 Essential (primary) hypertension: Secondary | ICD-10-CM | POA: Diagnosis not present

## 2019-11-25 DIAGNOSIS — C679 Malignant neoplasm of bladder, unspecified: Secondary | ICD-10-CM | POA: Diagnosis not present

## 2019-11-25 DIAGNOSIS — M6281 Muscle weakness (generalized): Secondary | ICD-10-CM | POA: Diagnosis not present

## 2019-11-25 DIAGNOSIS — R4701 Aphasia: Secondary | ICD-10-CM | POA: Diagnosis not present

## 2019-11-25 DIAGNOSIS — I69820 Aphasia following other cerebrovascular disease: Secondary | ICD-10-CM | POA: Diagnosis not present

## 2019-11-25 DIAGNOSIS — I679 Cerebrovascular disease, unspecified: Secondary | ICD-10-CM | POA: Diagnosis not present

## 2019-11-25 DIAGNOSIS — E119 Type 2 diabetes mellitus without complications: Secondary | ICD-10-CM | POA: Diagnosis not present

## 2019-11-25 DIAGNOSIS — Z87891 Personal history of nicotine dependence: Secondary | ICD-10-CM | POA: Diagnosis not present

## 2019-11-26 DIAGNOSIS — I679 Cerebrovascular disease, unspecified: Secondary | ICD-10-CM | POA: Diagnosis not present

## 2019-11-26 DIAGNOSIS — I69820 Aphasia following other cerebrovascular disease: Secondary | ICD-10-CM | POA: Diagnosis not present

## 2019-11-26 DIAGNOSIS — M6281 Muscle weakness (generalized): Secondary | ICD-10-CM | POA: Diagnosis not present

## 2019-11-26 DIAGNOSIS — Z87891 Personal history of nicotine dependence: Secondary | ICD-10-CM | POA: Diagnosis not present

## 2019-11-28 DIAGNOSIS — M6281 Muscle weakness (generalized): Secondary | ICD-10-CM | POA: Diagnosis not present

## 2019-11-28 DIAGNOSIS — Z87891 Personal history of nicotine dependence: Secondary | ICD-10-CM | POA: Diagnosis not present

## 2019-11-28 DIAGNOSIS — I69820 Aphasia following other cerebrovascular disease: Secondary | ICD-10-CM | POA: Diagnosis not present

## 2019-11-28 DIAGNOSIS — I679 Cerebrovascular disease, unspecified: Secondary | ICD-10-CM | POA: Diagnosis not present

## 2019-11-30 DIAGNOSIS — I679 Cerebrovascular disease, unspecified: Secondary | ICD-10-CM | POA: Diagnosis not present

## 2019-11-30 DIAGNOSIS — I69820 Aphasia following other cerebrovascular disease: Secondary | ICD-10-CM | POA: Diagnosis not present

## 2019-11-30 DIAGNOSIS — Z87891 Personal history of nicotine dependence: Secondary | ICD-10-CM | POA: Diagnosis not present

## 2019-11-30 DIAGNOSIS — M6281 Muscle weakness (generalized): Secondary | ICD-10-CM | POA: Diagnosis not present

## 2019-11-30 DIAGNOSIS — C678 Malignant neoplasm of overlapping sites of bladder: Secondary | ICD-10-CM | POA: Diagnosis not present

## 2019-11-30 DIAGNOSIS — Z5111 Encounter for antineoplastic chemotherapy: Secondary | ICD-10-CM | POA: Diagnosis not present

## 2019-12-01 DIAGNOSIS — I679 Cerebrovascular disease, unspecified: Secondary | ICD-10-CM | POA: Diagnosis not present

## 2019-12-01 DIAGNOSIS — Z87891 Personal history of nicotine dependence: Secondary | ICD-10-CM | POA: Diagnosis not present

## 2019-12-01 DIAGNOSIS — M6281 Muscle weakness (generalized): Secondary | ICD-10-CM | POA: Diagnosis not present

## 2019-12-01 DIAGNOSIS — I69820 Aphasia following other cerebrovascular disease: Secondary | ICD-10-CM | POA: Diagnosis not present

## 2019-12-02 DIAGNOSIS — I69820 Aphasia following other cerebrovascular disease: Secondary | ICD-10-CM | POA: Diagnosis not present

## 2019-12-02 DIAGNOSIS — M6281 Muscle weakness (generalized): Secondary | ICD-10-CM | POA: Diagnosis not present

## 2019-12-02 DIAGNOSIS — I679 Cerebrovascular disease, unspecified: Secondary | ICD-10-CM | POA: Diagnosis not present

## 2019-12-02 DIAGNOSIS — Z87891 Personal history of nicotine dependence: Secondary | ICD-10-CM | POA: Diagnosis not present

## 2019-12-03 ENCOUNTER — Other Ambulatory Visit: Payer: Self-pay | Admitting: Interventional Radiology

## 2019-12-03 DIAGNOSIS — M6281 Muscle weakness (generalized): Secondary | ICD-10-CM | POA: Diagnosis not present

## 2019-12-03 DIAGNOSIS — Z87891 Personal history of nicotine dependence: Secondary | ICD-10-CM | POA: Diagnosis not present

## 2019-12-03 DIAGNOSIS — I679 Cerebrovascular disease, unspecified: Secondary | ICD-10-CM | POA: Diagnosis not present

## 2019-12-03 DIAGNOSIS — Z95828 Presence of other vascular implants and grafts: Secondary | ICD-10-CM

## 2019-12-03 DIAGNOSIS — I69820 Aphasia following other cerebrovascular disease: Secondary | ICD-10-CM | POA: Diagnosis not present

## 2019-12-04 DIAGNOSIS — I69820 Aphasia following other cerebrovascular disease: Secondary | ICD-10-CM | POA: Diagnosis not present

## 2019-12-04 DIAGNOSIS — I679 Cerebrovascular disease, unspecified: Secondary | ICD-10-CM | POA: Diagnosis not present

## 2019-12-04 DIAGNOSIS — M6281 Muscle weakness (generalized): Secondary | ICD-10-CM | POA: Diagnosis not present

## 2019-12-04 DIAGNOSIS — Z87891 Personal history of nicotine dependence: Secondary | ICD-10-CM | POA: Diagnosis not present

## 2019-12-07 DIAGNOSIS — I69820 Aphasia following other cerebrovascular disease: Secondary | ICD-10-CM | POA: Diagnosis not present

## 2019-12-07 DIAGNOSIS — Z5111 Encounter for antineoplastic chemotherapy: Secondary | ICD-10-CM | POA: Diagnosis not present

## 2019-12-07 DIAGNOSIS — Z87891 Personal history of nicotine dependence: Secondary | ICD-10-CM | POA: Diagnosis not present

## 2019-12-07 DIAGNOSIS — C678 Malignant neoplasm of overlapping sites of bladder: Secondary | ICD-10-CM | POA: Diagnosis not present

## 2019-12-07 DIAGNOSIS — I679 Cerebrovascular disease, unspecified: Secondary | ICD-10-CM | POA: Diagnosis not present

## 2019-12-07 DIAGNOSIS — M6281 Muscle weakness (generalized): Secondary | ICD-10-CM | POA: Diagnosis not present

## 2019-12-08 DIAGNOSIS — I69820 Aphasia following other cerebrovascular disease: Secondary | ICD-10-CM | POA: Diagnosis not present

## 2019-12-08 DIAGNOSIS — Z87891 Personal history of nicotine dependence: Secondary | ICD-10-CM | POA: Diagnosis not present

## 2019-12-08 DIAGNOSIS — I679 Cerebrovascular disease, unspecified: Secondary | ICD-10-CM | POA: Diagnosis not present

## 2019-12-08 DIAGNOSIS — M6281 Muscle weakness (generalized): Secondary | ICD-10-CM | POA: Diagnosis not present

## 2019-12-09 DIAGNOSIS — M6281 Muscle weakness (generalized): Secondary | ICD-10-CM | POA: Diagnosis not present

## 2019-12-09 DIAGNOSIS — I679 Cerebrovascular disease, unspecified: Secondary | ICD-10-CM | POA: Diagnosis not present

## 2019-12-09 DIAGNOSIS — I69820 Aphasia following other cerebrovascular disease: Secondary | ICD-10-CM | POA: Diagnosis not present

## 2019-12-09 DIAGNOSIS — R4701 Aphasia: Secondary | ICD-10-CM | POA: Diagnosis not present

## 2019-12-09 DIAGNOSIS — I10 Essential (primary) hypertension: Secondary | ICD-10-CM | POA: Diagnosis not present

## 2019-12-09 DIAGNOSIS — Z87891 Personal history of nicotine dependence: Secondary | ICD-10-CM | POA: Diagnosis not present

## 2019-12-09 DIAGNOSIS — E119 Type 2 diabetes mellitus without complications: Secondary | ICD-10-CM | POA: Diagnosis not present

## 2019-12-09 DIAGNOSIS — C679 Malignant neoplasm of bladder, unspecified: Secondary | ICD-10-CM | POA: Diagnosis not present

## 2019-12-09 DIAGNOSIS — D649 Anemia, unspecified: Secondary | ICD-10-CM | POA: Diagnosis not present

## 2019-12-10 DIAGNOSIS — I679 Cerebrovascular disease, unspecified: Secondary | ICD-10-CM | POA: Diagnosis not present

## 2019-12-10 DIAGNOSIS — M6281 Muscle weakness (generalized): Secondary | ICD-10-CM | POA: Diagnosis not present

## 2019-12-10 DIAGNOSIS — Z87891 Personal history of nicotine dependence: Secondary | ICD-10-CM | POA: Diagnosis not present

## 2019-12-10 DIAGNOSIS — I69820 Aphasia following other cerebrovascular disease: Secondary | ICD-10-CM | POA: Diagnosis not present

## 2019-12-11 DIAGNOSIS — M6281 Muscle weakness (generalized): Secondary | ICD-10-CM | POA: Diagnosis not present

## 2019-12-11 DIAGNOSIS — Z87891 Personal history of nicotine dependence: Secondary | ICD-10-CM | POA: Diagnosis not present

## 2019-12-11 DIAGNOSIS — I69820 Aphasia following other cerebrovascular disease: Secondary | ICD-10-CM | POA: Diagnosis not present

## 2019-12-11 DIAGNOSIS — I679 Cerebrovascular disease, unspecified: Secondary | ICD-10-CM | POA: Diagnosis not present

## 2019-12-13 DIAGNOSIS — R531 Weakness: Secondary | ICD-10-CM | POA: Diagnosis not present

## 2019-12-13 DIAGNOSIS — R269 Unspecified abnormalities of gait and mobility: Secondary | ICD-10-CM | POA: Diagnosis not present

## 2019-12-16 DIAGNOSIS — I69820 Aphasia following other cerebrovascular disease: Secondary | ICD-10-CM | POA: Diagnosis not present

## 2019-12-16 DIAGNOSIS — Z87891 Personal history of nicotine dependence: Secondary | ICD-10-CM | POA: Diagnosis not present

## 2019-12-17 ENCOUNTER — Other Ambulatory Visit: Payer: Self-pay

## 2019-12-17 DIAGNOSIS — Z87891 Personal history of nicotine dependence: Secondary | ICD-10-CM | POA: Diagnosis not present

## 2019-12-17 DIAGNOSIS — I69820 Aphasia following other cerebrovascular disease: Secondary | ICD-10-CM | POA: Diagnosis not present

## 2019-12-17 DIAGNOSIS — I6521 Occlusion and stenosis of right carotid artery: Secondary | ICD-10-CM

## 2019-12-17 DIAGNOSIS — C678 Malignant neoplasm of overlapping sites of bladder: Secondary | ICD-10-CM | POA: Diagnosis not present

## 2019-12-17 DIAGNOSIS — R8271 Bacteriuria: Secondary | ICD-10-CM | POA: Diagnosis not present

## 2019-12-20 NOTE — Progress Notes (Signed)
Cardiology Office Note:    Date:  12/22/2019   ID:  Lisa [name], DOB [birth year], MRN [id number]  PCP:  Patient, No Pcp Per  Cardiologist:  Lisa Heinz, MD  Electrophysiologist:  None   Referring MD: No ref. provider found   Chief Complaint  Patient presents with  . Cerebrovascular Accident    History of Present Illness:    Lisa [name] is a 69 y.o. female with a hx of bladder cancer, bilateral DVTs, CVA, hypertension, hyperlipidemia, T2DM who presents for follow-up.  She was admitted July 2021 with acute CVA.  Found to have 80-99% left ICA stenosis.  Echocardiogram 7/1 showed normal biventricular function, linear mobile echodensity on the ventricular aspect of the aortic valve.  TEE was recommended but patient left AMA.  She underwent left carotid artery stenting on 08/20/2019.  She was readmitted from 7/19 through 09/07/2019 after presenting with hematuria, found to have bladder cancer.  Subsequently was readmitted from 8/5 through 10/03/2019 with acute blood loss anemia from hematuria from bladder cancer.  Also found to have another acute stroke.  Repeat echo that admission did not show echodensity.  She was also found bilateral DVTs.  Subsequently was admitted again from 8/23 through 10/14/2019 with anemia from hematuria.  Her anticoagulation and antiplatelet agents were held and urology performed clot irrigation with balloon tamponade with successful control of bleed.  IVC filter placed given recurrent bladder bleeds.  She was discharged on aspirin alone, as her apixaban and clopidogrel were discontinued.  Since her discharge, she reports that she has been doing well.  States that hematuria has resolved.  Currently just on aspirin 81 mg daily.  Underwent resection of bladder cancer, currently on intravesicular chemotherapy.  She reports no deficits from her recent strokes.  Denies any chest pain, dyspnea, lightheadedness, syncope, or palpitations.  Past Medical History:   Diagnosis Date  . Carotid arterial disease ([place]) 07/2019  . Diabetes ([place])   . HTN (hypertension)   . Hyperlipidemia   . Stroke [facility])    acute/subacute left MCA infarct 08/12/19    Past Surgical History:  Procedure Laterality Date  . CYSTOSCOPY WITH FULGERATION N/A 09/01/2019   Procedure: CYSTOSCOPY CLOT EVACUATION WITH POSSIBLE FULGERATION;  Surgeon: Robley Fries, MD;  Location: [facility];  Service: Urology;  Laterality: N/A;  . CYSTOSCOPY WITH FULGERATION N/A 09/28/2019   Procedure: [place] AND CLOT EVACUATION;  Surgeon: Robley Fries, MD;  Location: [facility];  Service: Urology;  Laterality: N/A;  . IR IVC FILTER PLMT / S&I Burke Keels GUID/MOD SED  10/07/2019  . TONSILLECTOMY    . TRANSCAROTID ARTERY REVASCULARIZATION Left 08/20/2019   Procedure: LEFT TRANSCAROTID ARTERY REVASCULARIZATION;  Surgeon: Waynetta Sandy, MD;  Location: [facility];  Service: Vascular;  Laterality: Left;  . TRANSURETHRAL RESECTION OF BLADDER TUMOR WITH MITOMYCIN-C N/A 09/22/2019   Procedure: TRANSURETHRAL RESECTION OF BLADDER TUMOR WITH INTRAVESICAL GEMCITABINE;  Surgeon: Robley Fries, MD;  [facility];  Service: Urology;  Laterality: N/A;  90 MINS    Current Medications: Current Meds  Medication Sig  . amLODipine (NORVASC) 10 MG tablet Take 1 tablet (10 mg total) by mouth daily. (Patient taking differently: Take 10 mg by mouth in the morning. )  . aspirin EC 81 MG EC tablet Take 1 tablet (81 mg total) by mouth daily. Swallow whole.  Marland Kitchen atorvastatin (LIPITOR) 80 MG tablet Take 1 tablet (80 mg total) by mouth daily. (Patient taking differently: Take 80 mg by mouth at  bedtime. )  . cephALEXin (KEFLEX) 500 MG capsule Take 500 mg by mouth 3 (three) times daily.  Marland Kitchen losartan (COZAAR) 25 MG tablet Take 25 mg by mouth in the morning.   . metFORMIN (GLUCOPHAGE XR) 500 MG 24 hr tablet Take 1 tablet (500 mg total) by mouth daily with breakfast.  . MULTIPLE VITAMIN PO Take 1 tablet by mouth  daily.     Allergies:   Patient has no known allergies.   Social History   Socioeconomic History  . Marital status: Married    Spouse name: Not on file  . Number of children: Not on file  . Years of education: Not on file  . Highest education level: Not on file  Occupational History  . Not on file  Tobacco Use  . Smoking status: Former Smoker    Packs/day: 0.50    Types: Cigarettes    Quit date: 07/28/2019    Years since quitting: 0.4  . Smokeless tobacco: Never Used  Vaping Use  . Vaping Use: Never used  Substance and Sexual Activity  . Alcohol use: Not Currently  . Drug use: Never  . Sexual activity: Yes  Other Topics Concern  . Not on file  Social History Narrative  . Not on file   Social Determinants of Health   Financial Resource Strain:   . Difficulty of Paying Living Expenses: Not on file  Food Insecurity:   . Worried About Charity fundraiser in the Last Year: Not on file  . Ran Out of Food in the Last Year: Not on file  Transportation Needs:   . Lack of Transportation (Medical): Not on file  . Lack of Transportation (Non-Medical): Not on file  Physical Activity:   . Days of Exercise per Week: Not on file  . Minutes of Exercise per Session: Not on file  Stress:   . Feeling of Stress : Not on file  Social Connections:   . Frequency of Communication with Friends and Family: Not on file  . Frequency of Social Gatherings with Friends and Family: Not on file  . Attends Religious Services: Not on file  . Active Member of Clubs or Organizations: Not on file  . Attends Archivist Meetings: Not on file  . Marital Status: Not on file     Family History: The patient's family history includes Hypertension in her mother; Lung cancer in her father.  ROS:   Please see the history of present illness.     All other systems reviewed and are negative.  EKGs/Labs/Other Studies Reviewed:    The following studies were reviewed today:   EKG:  EKG is   ordered today.  The ekg ordered today demonstrates sinus rhythm, PACs,, Q waves in V1/2  Recent Labs: 09/02/2019: Magnesium 1.5 10/05/2019: ALT 16 10/18/2019: BUN 10; Creatinine, Ser 0.49; Hemoglobin 10.1; Platelets 352; Potassium 3.7; Sodium 134  Recent Lipid Panel    Component Value Date/Time   CHOL 94 09/18/2019 0421   TRIG 82 09/18/2019 0421   HDL 32 (L) 09/18/2019 0421   CHOLHDL 2.9 09/18/2019 0421   VLDL 16 09/18/2019 0421   LDLCALC 46 09/18/2019 0421    Physical Exam:    VS:  BP 128/80   Pulse 82   Ht 5' 6.5" (1.689 m)   Wt 150 lb 12.8 oz (68.4 kg)   SpO2 99%   BMI 23.98 kg/m     Wt Readings from Last 3 Encounters:  12/22/19 150 lb 12.8 oz (  68.4 kg)  10/06/19 176 lb 5.9 oz (80 kg)  09/27/19 177 lb 7.5 oz (80.5 kg)     GEN:   in no acute distress HEENT: Normal NECK: No JVD; No carotid bruits CARDIAC: RRR, no murmurs, rubs, gallops RESPIRATORY:  Clear to auscultation without rales, wheezing or rhonchi  ABDOMEN: Soft, non-tender, non-distended MUSCULOSKELETAL:  No edema; No deformity  SKIN: Warm and dry NEUROLOGIC:  Alert and oriented x 3 PSYCHIATRIC:  Normal affect   ASSESSMENT:    1. Cerebrovascular accident (CVA), unspecified mechanism ([place])   2. Pre-procedure lab exam   3. Essential hypertension   4. Hyperlipidemia, unspecified hyperlipidemia type   5. Carotid stenosis, asymptomatic, left    PLAN:    Acute CVA: Admitted with an acute left MCA stroke 6/30.  Found to have severe left ICA stenosis status post stenting.  Subsequently admitted with an acute lacunar stroke 8/5.  TTE 7/1 showed normal biventricular function, linear mobile echodensity on the ventricular aspect of the aortic valve.  TEE was recommended but patient left AMA -She is now agreeable to undergoing TEE.  Will schedule -Zio patch x2 weeks  Bilateral DVTs: IVC filter placed.  Not on anticoagulation due to recurrent hematuria from bladder cancer  Bladder cancer: Status post resection,  currently undergoing intravesicular chemotherapy  Hypertension: On losartan 25 mg daily, appears controlled  Hyperlipidemia: On atorvastatin 80 mg daily, LDL 46 on 09/18/2018  Carotid stenosis:  Found to have 80-99% left ICA stenosis.  She underwent left carotid artery stenting on 08/20/2019.   Continue aspirin and statin  RTC in 3 months   Medication Adjustments/Labs and Tests Ordered: Current medicines are reviewed at length with the patient today.  Concerns regarding medicines are outlined above.  Orders Placed This Encounter  Procedures  . Basic metabolic panel  . CBC  . LONG TERM MONITOR (3-14 DAYS)  . EKG 12-Lead   No orders of the defined types were placed in this encounter.   Patient Instructions  You are scheduled for a TEE on Wednesday, 12/1 with Dr. Marlou Porch.  Please arrive at the [facility] (Main Entrance A) at [name]: [address] [place], [place] [address] at 10:30 AM    DIET: Nothing to eat or drink after midnight except a sip of water with medications (see medication instructions below)  Medication Instructions: Take medication as usual   Labs: Within 1 week of procedure You can come to [facility] office to have this completed or any [facility]  COVID TEST: Saturday 11/27 at 11:45 AM [address] must have a responsible person to drive you home and stay in the waiting area during your procedure. Failure to do so could result in cancellation.  Bring your insurance cards.  *Special Note: Every effort is made to have your procedure done on time. Occasionally there are emergencies that occur at the hospital that may cause delays. Please be patient if a delay does occur.       ZIO XT- Long Term Monitor Instructions   Your physician has requested you wear your ZIO patch monitor 14 days.   This is a single patch monitor.  [employer] supplies one patch monitor per enrollment.  Additional stickers are not available.   Please  do not apply patch if you will be having a Nuclear Stress Test, Echocardiogram, Cardiac CT, MRI, or Chest Xray during the time frame you would be wearing the monitor. The patch cannot be worn during these tests.  You cannot remove and re-apply the ZIO XT patch monitor.   Your ZIO patch monitor will be sent USPS Priority mail from [facility] directly to your home address. The monitor may also be mailed to a PO BOX if home delivery is not available.   It may take 3-5 days to receive your monitor after you have been enrolled.   Once you have received you monitor, please review enclosed instructions.  Your monitor has already been registered assigning a specific monitor serial # to you.   Applying the monitor   Shave hair from upper left chest.   Hold abrader disc by orange tab.  Rub abrader in 40 strokes over left upper chest as indicated in your monitor instructions.   Clean area with 4 enclosed alcohol pads .  Use all pads to assure are is cleaned thoroughly.  Let dry.   Apply patch as indicated in monitor instructions.  Patch will be place under collarbone on left side of chest with arrow pointing upward.   Rub patch adhesive wings for 2 minutes.Remove white label marked "1".  Remove white label marked "2".  Rub patch adhesive wings for 2 additional minutes.   While looking in a mirror, press and release button in center of patch.  A small green light will flash 3-4 times .  This will be your only indicator the monitor has been turned on.     Do not shower for the first 24 hours.  You may shower after the first 24 hours.   Press button if you feel a symptom. You will hear a small click.  Record Date, Time and Symptom in the Patient Log Book.   When you are ready to remove patch, follow instructions on last 2 pages of Patient Log Book.  Stick patch monitor onto last page of Patient Log Book.   Place Patient Log Book in [place] box.  Use locking tab on box and tape box closed securely.   The [employer] has [employer] on it.  Please place in mailbox as soon as possible.  Your physician should have your test results approximately 7 days after the monitor has been mailed back to [facility].   Call [facility] at [phone] if you have questions regarding your ZIO XT patch monitor.  Call them immediately if you see an orange light blinking on your monitor.   If your monitor falls off in less than 4 days contact our Monitor department at [phone].  If your monitor becomes loose or falls off after 4 days call [employer] at [phone] for suggestions on securing your monitor.     Follow up with Dr. Gardiner Rhyme in 3 months    Signed, Lisa Heinz, MD  12/22/2019 1:07 PM    [place]

## 2019-12-21 DIAGNOSIS — Z87891 Personal history of nicotine dependence: Secondary | ICD-10-CM | POA: Diagnosis not present

## 2019-12-21 DIAGNOSIS — I69820 Aphasia following other cerebrovascular disease: Secondary | ICD-10-CM | POA: Diagnosis not present

## 2019-12-22 ENCOUNTER — Encounter: Payer: Self-pay | Admitting: *Deleted

## 2019-12-22 ENCOUNTER — Ambulatory Visit (INDEPENDENT_AMBULATORY_CARE_PROVIDER_SITE_OTHER): Payer: Medicare Other | Admitting: Cardiology

## 2019-12-22 ENCOUNTER — Encounter: Payer: Self-pay | Admitting: Cardiology

## 2019-12-22 ENCOUNTER — Other Ambulatory Visit: Payer: Self-pay | Admitting: *Deleted

## 2019-12-22 VITALS — BP 128/80 | HR 82 | Ht 66.5 in | Wt 150.8 lb

## 2019-12-22 DIAGNOSIS — I6522 Occlusion and stenosis of left carotid artery: Secondary | ICD-10-CM | POA: Diagnosis not present

## 2019-12-22 DIAGNOSIS — I10 Essential (primary) hypertension: Secondary | ICD-10-CM

## 2019-12-22 DIAGNOSIS — Z87891 Personal history of nicotine dependence: Secondary | ICD-10-CM | POA: Diagnosis not present

## 2019-12-22 DIAGNOSIS — Z01812 Encounter for preprocedural laboratory examination: Secondary | ICD-10-CM

## 2019-12-22 DIAGNOSIS — I69820 Aphasia following other cerebrovascular disease: Secondary | ICD-10-CM | POA: Diagnosis not present

## 2019-12-22 DIAGNOSIS — E785 Hyperlipidemia, unspecified: Secondary | ICD-10-CM

## 2019-12-22 DIAGNOSIS — I639 Cerebral infarction, unspecified: Secondary | ICD-10-CM

## 2019-12-22 NOTE — Patient Instructions (Addendum)
You are scheduled for a TEE on Wednesday, 12/1 with Dr. Marlou Porch.  Please arrive at the [facility] (Main Entrance A) at [facility]: [address] [place], [place] [address] at 10:30 AM    DIET: Nothing to eat or drink after midnight except a sip of water with medications (see medication instructions below)  Medication Instructions: Take medication as usual   Labs: Within 1 week of procedure You can come to [facility] office to have this completed or any [facility]  COVID TEST: Saturday 11/27 at 11:45 AM [address] must have a responsible person to drive you home and stay in the waiting area during your procedure. Failure to do so could result in cancellation.  Bring your insurance cards.  *Special Note: Every effort is made to have your procedure done on time. Occasionally there are emergencies that occur at the hospital that may cause delays. Please be patient if a delay does occur.       ZIO XT- Long Term Monitor Instructions   Your physician has requested you wear your ZIO patch monitor 14 days.   This is a single patch monitor.  [employer] supplies one patch monitor per enrollment.  Additional stickers are not available.   Please do not apply patch if you will be having a Nuclear Stress Test, Echocardiogram, Cardiac CT, MRI, or Chest Xray during the time frame you would be wearing the monitor. The patch cannot be worn during these tests.  You cannot remove and re-apply the ZIO XT patch monitor.   Your ZIO patch monitor will be sent USPS Priority mail from [facility] directly to your home address. The monitor may also be mailed to a PO BOX if home delivery is not available.   It may take 3-5 days to receive your monitor after you have been enrolled.   Once you have received you monitor, please review enclosed instructions.  Your monitor has already been registered assigning a specific monitor serial # to you.   Applying the monitor    Shave hair from upper left chest.   Hold abrader disc by orange tab.  Rub abrader in 40 strokes over left upper chest as indicated in your monitor instructions.   Clean area with 4 enclosed alcohol pads .  Use all pads to assure are is cleaned thoroughly.  Let dry.   Apply patch as indicated in monitor instructions.  Patch will be place under collarbone on left side of chest with arrow pointing upward.   Rub patch adhesive wings for 2 minutes.Remove white label marked "1".  Remove white label marked "2".  Rub patch adhesive wings for 2 additional minutes.   While looking in a mirror, press and release button in center of patch.  A small green light will flash 3-4 times .  This will be your only indicator the monitor has been turned on.     Do not shower for the first 24 hours.  You may shower after the first 24 hours.   Press button if you feel a symptom. You will hear a small click.  Record Date, Time and Symptom in the Patient Log Book.   When you are ready to remove patch, follow instructions on last 2 pages of Patient Log Book.  Stick patch monitor onto last page of Patient Log Book.   Place Patient Log Book in [place] box.  Use locking tab on box and tape box closed securely.  The [place] and  White box has [employer] on it.  Please place in mailbox as soon as possible.  Your physician should have your test results approximately 7 days after the monitor has been mailed back to [facility].   Call [place] at [phone] if you have questions regarding your ZIO XT patch monitor.  Call them immediately if you see an orange light blinking on your monitor.   If your monitor falls off in less than 4 days contact our Monitor department at [phone].  If your monitor becomes loose or falls off after 4 days call [employer] at [phone] for suggestions on securing your monitor.     Follow up with Dr. Gardiner Rhyme in 3 months

## 2019-12-22 NOTE — Progress Notes (Signed)
Patient ID: Lisa [name], female   DOB: 1950-08-25, 69 y.o.   MRN: [id number] Patient enrolled for [employer] to ship a 14 day ZIO XT long term holter monitor to her home.

## 2019-12-23 ENCOUNTER — Telehealth: Payer: Self-pay | Admitting: Cardiology

## 2019-12-23 NOTE — Telephone Encounter (Signed)
Spoke with patient's spouse. Spouse is requesting a letter stating patient had two strokes and that patient's spouse will be caretaker for awhile. Patient reports his lawyer needs the letter by Friday. Will route to MD and primary nurse for review.

## 2019-12-23 NOTE — Telephone Encounter (Signed)
Patient's husband calling on behalf of wife - he is requesting confirmation that his wife has indeed had the two strokes that she has had. He is requesting to speak with [facility], Dr. Newman Nickels RN. Please call/advise.   Thank you!

## 2019-12-24 NOTE — Telephone Encounter (Signed)
Would recommend discussing with PCP or neurologist- if they are asking for a letter stating that they need her husband to be caretaker given her strokes that is not something we would do as cardiologist, sounds like PCP or neurologist would need to evaluate

## 2019-12-25 ENCOUNTER — Ambulatory Visit (HOSPITAL_COMMUNITY)
Admission: RE | Admit: 2019-12-25 | Discharge: 2019-12-25 | Disposition: A | Discharge status: Home / Self Care | Payer: Medicare Other | Source: Ambulatory Visit | Attending: Vascular Surgery | Admitting: Vascular Surgery

## 2019-12-25 ENCOUNTER — Encounter: Payer: Self-pay | Admitting: Vascular Surgery

## 2019-12-25 ENCOUNTER — Other Ambulatory Visit: Payer: Self-pay

## 2019-12-25 ENCOUNTER — Ambulatory Visit (INDEPENDENT_AMBULATORY_CARE_PROVIDER_SITE_OTHER): Payer: Medicare Other | Admitting: Vascular Surgery

## 2019-12-25 ENCOUNTER — Telehealth: Payer: Self-pay | Admitting: *Deleted

## 2019-12-25 VITALS — BP 138/76 | HR 84 | Temp 97.7°F | Resp 20 | Ht 66.5 in | Wt 151.0 lb

## 2019-12-25 DIAGNOSIS — I6521 Occlusion and stenosis of right carotid artery: Secondary | ICD-10-CM

## 2019-12-25 DIAGNOSIS — I6522 Occlusion and stenosis of left carotid artery: Secondary | ICD-10-CM

## 2019-12-25 MED ORDER — CLOPIDOGREL BISULFATE 75 MG PO TABS: 75.0000 mg | ORAL_TABLET | Freq: Every day | ORAL | 6 refills | Status: DC

## 2019-12-25 NOTE — Progress Notes (Signed)
Patient ID: Lisa [name], female   DOB: July 17, 1950, 70 y.o.   MRN: [id number]  Reason for Consult: Follow-up   Referred by No ref. provider found  Subjective:     HPI:  Lisa [name] is a 69 y.o. 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  She has been in a nursing home.  During her last hospitalization she also had a filter placed she was having blood in her urine was found to have cancer for which she is on chemotherapy.  She also has an IVC filter placed.  Her medication list today has her on aspirin statin and Plavix.  Past Medical History:  Diagnosis Date  . Carotid arterial disease ([place]) 07/2019  . Diabetes ([place])   . HTN (hypertension)   . Hyperlipidemia   . Stroke [facility])    acute/subacute left MCA infarct 08/12/19   Family History  Problem Relation Age of Onset  . Hypertension Mother   . Lung cancer Father    Past Surgical History:  Procedure Laterality Date  . CYSTOSCOPY WITH FULGERATION N/A 09/01/2019   Procedure: CYSTOSCOPY CLOT EVACUATION WITH POSSIBLE FULGERATION;  Surgeon: Robley Fries, MD;  Location: [place];  Service: Urology;  Laterality: N/A;  . CYSTOSCOPY WITH FULGERATION N/A 09/28/2019   Procedure: [place] AND CLOT EVACUATION;  Surgeon: Robley Fries, MD;  Location: [place];  Service: Urology;  Laterality: N/A;  . IR IVC FILTER PLMT / S&I Burke Keels GUID/MOD SED  10/07/2019  . TONSILLECTOMY    . TRANSCAROTID ARTERY REVASCULARIZATION Left 08/20/2019   Procedure: LEFT TRANSCAROTID ARTERY REVASCULARIZATION;  Surgeon: Waynetta Sandy, MD;  Location: [place];  Service: Vascular;  Laterality: Left;  . TRANSURETHRAL RESECTION OF BLADDER TUMOR WITH MITOMYCIN-C N/A 09/22/2019   Procedure: TRANSURETHRAL RESECTION OF BLADDER TUMOR WITH INTRAVESICAL GEMCITABINE;  Surgeon: Robley Fries, MD;  Location: [facility];  Service: Urology;  Laterality: N/A;  90 MINS     Short Social History:  Social History   Tobacco Use  . Smoking status: Former Smoker    Packs/day: 0.50    Types: Cigarettes    Quit date: 07/28/2019    Years since quitting: 0.4  . Smokeless tobacco: Never Used  Substance Use Topics  . Alcohol use: Not Currently    No Known Allergies  Current Outpatient Medications  Medication Sig Dispense Refill  . amLODipine (NORVASC) 10 MG tablet Take 1 tablet (10 mg total) by mouth daily. (Patient taking differently: Take 10 mg by mouth in the morning. ) 30 tablet 0  . aspirin EC 81 MG EC tablet Take 1 tablet (81 mg total) by mouth daily. Swallow whole. 30 tablet 0  . atorvastatin (LIPITOR) 80 MG tablet Take 1 tablet (80 mg total) by mouth daily. (Patient taking differently: Take 80 mg by mouth at bedtime. ) 30 tablet 0  . cephALEXin (KEFLEX) 500 MG capsule Take 500 mg by mouth 3 (three) times daily.    Marland Kitchen losartan (COZAAR) 25 MG tablet Take 25 mg by mouth in the morning.     . metFORMIN (GLUCOPHAGE XR) 500 MG 24 hr tablet Take 1 tablet (500 mg total) by mouth daily with breakfast. 30 tablet 0  . MULTIPLE VITAMIN PO Take 1 tablet by mouth daily.     No current facility-administered medications for this visit.    Review of Systems  Constitutional:  Constitutional negative. HENT: HENT negative.  Eyes:  Eyes negative.  Respiratory: Respiratory negative.  Cardiovascular: Cardiovascular negative.  GI: Gastrointestinal negative.  GU: Positive for hematuria.  Skin: Skin negative.  Neurological: Neurological negative. Hematologic: Hematologic/lymphatic negative.  Psychiatric: Positive for confusion.        Objective:  Objective  Vitals:   12/25/19 0924 12/25/19 0927  BP: 134/75 138/76  Pulse: 84   Resp: 20   Temp: 97.7 F (36.5 C)   SpO2: 98%     Physical Exam Constitutional:      Comments: She has lost significant weight since last eval  HENT:     Head: Normocephalic.     Nose: Nose normal.  Neck:     Vascular: No  carotid bruit.  Abdominal:     General: Abdomen is flat.     Palpations: Abdomen is soft. There is no mass.  Musculoskeletal:        General: No swelling. Normal range of motion.  Skin:    General: Skin is warm and dry.  Neurological:     General: No focal deficit present.     Mental Status: She is alert.  Psychiatric:        Mood and Affect: Mood normal.        Behavior: Behavior normal.        Thought Content: Thought content normal.        Judgment: Judgment normal.     Data Carotid Duplex Right Carotid Findings:  +----------+--------+--------+--------+------------------+--------+       PSV cm/sEDV cm/sStenosisPlaque DescriptionComments  +----------+--------+--------+--------+------------------+--------+  CCA Prox 79   16                      +----------+--------+--------+--------+------------------+--------+  CCA Distal50   12                      +----------+--------+--------+--------+------------------+--------+  ICA Prox 74   19   1-39%  heterogenous         +----------+--------+--------+--------+------------------+--------+  ICA Mid  64   20                      +----------+--------+--------+--------+------------------+--------+  ICA Distal54   14                      +----------+--------+--------+--------+------------------+--------+  ECA    124   16                      +----------+--------+--------+--------+------------------+--------+   +----------+--------+-------+----------------+-------------------+       PSV cm/sEDV cmsDescribe    Arm Pressure (mmHG)  +----------+--------+-------+----------------+-------------------+  [id number]       Multiphasic, WNL            +----------+--------+-------+----------------+-------------------+    +---------+--------+--+--------+--+---------+  VertebralPSV cm/s47EDV cm/s15Antegrade  +---------+--------+--+--------+--+---------+      Left Carotid Findings:  +----------+--------+--------+--------+------------------+--------+       PSV cm/sEDV cm/sStenosisPlaque DescriptionComments  +----------+--------+--------+--------+------------------+--------+  CCA Prox 42   9                       +----------+--------+--------+--------+------------------+--------+  CCA Mid  43   12                      +----------+--------+--------+--------+------------------+--------+  ICA Mid  58   18                      +----------+--------+--------+--------+------------------+--------+  ICA Distal67   22                      +----------+--------+--------+--------+------------------+--------+  ECA    448   79   >50%                 +----------+--------+--------+--------+------------------+--------+   +----------+--------+--------+----------------+-------------------+       PSV cm/sEDV cm/sDescribe    Arm Pressure (mmHG)  +----------+--------+--------+----------------+-------------------+  [id number]       Multiphasic, WNL            +----------+--------+--------+----------------+-------------------+   +---------+--------+--+--------+-+---------+  VertebralPSV cm/s41EDV cm/s9Antegrade  +---------+--------+--+--------+-+---------+      Left Stent(s):  +---------------+--+--++++  Prox to Stent 4312  +---------------+--+--++++  Proximal Stent 399   +---------------+--+--++++  Mid Stent   359   +---------------+--+--++++  Distal Stent  5411  +---------------+--+--++++  Distal to [id number]  +---------------+--+--++++    Summary:   Right Carotid: Velocities in the right ICA are consistent with a 1-39%  stenosis.   Left Carotid: Patent ICA stent <50% stenosis.      Assessment/Plan:     69yo female status post left transcarotid artery stenting.  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  She also has an IVC filter placed.  From purely a transcarotid standpoint I would like her on Plavix until least January we will see her back in 1 year with carotid duplex.     Waynetta Sandy MD [facility]

## 2019-12-25 NOTE — Telephone Encounter (Signed)
Tabithia calling from [facility] stating that pt. Has never been on Plavix and ov notes state to continue Plavix. Call back number [phone]

## 2019-12-25 NOTE — Telephone Encounter (Signed)
Spoke with Dr Donzetta Matters patient is supposed to be on plavix since she has a stent. Dr Donzetta Matters sent order to patients pharmacy and I spoke with Tabithia at [facility] and gave her the order to start patient back on plavix 75mg  daily. Tabithia verbalized understanding.

## 2019-12-25 NOTE — Addendum Note (Signed)
Addended by: Alice Rieger on: 12/25/2019 03:03 PM   Modules accepted: Orders

## 2019-12-25 NOTE — Telephone Encounter (Signed)
Left detailed message (ok per DPR) advising we are unable to complete letter-defer to PCP or Neuro.

## 2019-12-25 NOTE — Telephone Encounter (Signed)
Attempt to return call to # listed-VM states unknown name and unable to leave VM (VM full)   Also received letter (dropped off by husband yesterday, nurse OOO)-letter addressed to lawyer, husband requesting on letterhead.   Per MD-this is pertaining to patients recent CVA-needs to get note from Neuro or PCP.

## 2019-12-27 DIAGNOSIS — Z87891 Personal history of nicotine dependence: Secondary | ICD-10-CM | POA: Diagnosis not present

## 2019-12-27 DIAGNOSIS — I69820 Aphasia following other cerebrovascular disease: Secondary | ICD-10-CM | POA: Diagnosis not present

## 2019-12-29 DIAGNOSIS — I69820 Aphasia following other cerebrovascular disease: Secondary | ICD-10-CM | POA: Diagnosis not present

## 2019-12-29 DIAGNOSIS — Z87891 Personal history of nicotine dependence: Secondary | ICD-10-CM | POA: Diagnosis not present

## 2019-12-30 DIAGNOSIS — I69820 Aphasia following other cerebrovascular disease: Secondary | ICD-10-CM | POA: Diagnosis not present

## 2019-12-30 DIAGNOSIS — Z87891 Personal history of nicotine dependence: Secondary | ICD-10-CM | POA: Diagnosis not present

## 2019-12-31 DIAGNOSIS — I69820 Aphasia following other cerebrovascular disease: Secondary | ICD-10-CM | POA: Diagnosis not present

## 2019-12-31 DIAGNOSIS — Z87891 Personal history of nicotine dependence: Secondary | ICD-10-CM | POA: Diagnosis not present

## 2020-01-05 DIAGNOSIS — I69820 Aphasia following other cerebrovascular disease: Secondary | ICD-10-CM | POA: Diagnosis not present

## 2020-01-05 DIAGNOSIS — Z87891 Personal history of nicotine dependence: Secondary | ICD-10-CM | POA: Diagnosis not present

## 2020-01-06 ENCOUNTER — Ambulatory Visit (INDEPENDENT_AMBULATORY_CARE_PROVIDER_SITE_OTHER): Payer: Medicare Other | Admitting: Physician Assistant

## 2020-01-06 ENCOUNTER — Other Ambulatory Visit: Payer: Self-pay

## 2020-01-06 ENCOUNTER — Encounter: Payer: Self-pay | Admitting: Physician Assistant

## 2020-01-06 VITALS — BP 98/60 | HR 92 | Temp 97.9°F | Resp 16 | Ht 65.0 in | Wt 151.0 lb

## 2020-01-06 DIAGNOSIS — R4701 Aphasia: Secondary | ICD-10-CM | POA: Diagnosis not present

## 2020-01-06 DIAGNOSIS — E119 Type 2 diabetes mellitus without complications: Secondary | ICD-10-CM

## 2020-01-06 DIAGNOSIS — C678 Malignant neoplasm of overlapping sites of bladder: Secondary | ICD-10-CM | POA: Diagnosis not present

## 2020-01-06 DIAGNOSIS — I10 Essential (primary) hypertension: Secondary | ICD-10-CM | POA: Diagnosis not present

## 2020-01-06 DIAGNOSIS — I6381 Other cerebral infarction due to occlusion or stenosis of small artery: Secondary | ICD-10-CM

## 2020-01-06 DIAGNOSIS — Z1231 Encounter for screening mammogram for malignant neoplasm of breast: Secondary | ICD-10-CM

## 2020-01-06 DIAGNOSIS — C679 Malignant neoplasm of bladder, unspecified: Secondary | ICD-10-CM

## 2020-01-06 DIAGNOSIS — E7800 Pure hypercholesterolemia, unspecified: Secondary | ICD-10-CM

## 2020-01-06 DIAGNOSIS — Z5111 Encounter for antineoplastic chemotherapy: Secondary | ICD-10-CM | POA: Diagnosis not present

## 2020-01-06 DIAGNOSIS — Z1211 Encounter for screening for malignant neoplasm of colon: Secondary | ICD-10-CM

## 2020-01-06 DIAGNOSIS — Z780 Asymptomatic menopausal state: Secondary | ICD-10-CM

## 2020-01-06 DIAGNOSIS — I824Z3 Acute embolism and thrombosis of unspecified deep veins of distal lower extremity, bilateral: Secondary | ICD-10-CM

## 2020-01-06 LAB — LIPID PANEL
Cholesterol: 157 mg/dL (ref 0–200)
HDL: 74.7 mg/dL (ref >39.00)
LDL Cholesterol: 63 mg/dL (ref 0–99)
NonHDL: 82.64
Total CHOL/HDL Ratio: 2
Triglycerides: 97 mg/dL (ref 0.0–149.0)
VLDL: 19.4 mg/dL (ref 0.0–40.0)

## 2020-01-06 LAB — COMPREHENSIVE METABOLIC PANEL
ALT: 12 U/L (ref 0–35)
AST: 18 U/L (ref 0–37)
Albumin: 3.5 g/dL (ref 3.5–5.2)
Alkaline Phosphatase: 83 U/L (ref 39–117)
BUN: 11 mg/dL (ref 6–23)
CO2: 27 mEq/L (ref 19–32)
Calcium: 9.6 mg/dL (ref 8.4–10.5)
Chloride: 102 mEq/L (ref 96–112)
Creatinine, Ser: 0.57 mg/dL (ref 0.40–1.20)
GFR: 94.13 mL/min (ref >60.00)
Glucose, Bld: 139 mg/dL — ABNORMAL HIGH (ref 70–99)
Potassium: 3.9 mEq/L (ref 3.5–5.1)
Sodium: 139 mEq/L (ref 135–145)
Total Bilirubin: 0.4 mg/dL (ref 0.2–1.2)
Total Protein: 6.3 g/dL (ref 6.0–8.3)

## 2020-01-06 LAB — HEMOGLOBIN A1C: Hgb A1c MFr Bld: 5.9 % (ref 4.6–6.5)

## 2020-01-06 NOTE — Progress Notes (Signed)
Patient presents to clinic today with her husband to establish care. Patient endorses that she has never had a primary care provider before.  Has also never seen GYN as an adult.  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  Is status post surgical resection.  Is currently followed by cardiology (Dr. Harrington Challenger), vascular surgery (Dr. Gwenlyn Saran), urology (Dr. Claudia Desanctis) with upcoming appointment with neurology (Dr. Leonie Man).  Is currently residing in a skilled rehabilitation center since her last hospitalization with plan to go home in the next month.  Patient states overall she is doing very well.  No issues currently with abdominal or pelvic pain after her surgery.  Notes good urinary output without recurrence of hematuria.  Overall after her stroke is doing very well.  Notes good strength and ambulation.  Still having some difficulty occasionally with expressive aphasia.  Is having evaluation and treatment 3 times weekly with speech therapy at her current facility.  Patient notes recent diagnosis of diabetes while in the hospital.  A1c noted to be at 6.5.  Patient was started on Metformin which she endorses taking once daily as directed.  States she would like this reassessed as she is hoping she is out of the diabetic range.  Really wants to control this with diet and exercise if possible.  Patient is currently on losartan 25 mg daily and atorvastatin 80 mg daily along with baby aspirin.  Denies any symptoms of neuropathy.  Denies vision changes.  Is overdue for eye examination.  Unsure of what immunization she has been given in her skilled nursing facility but no she received a few a couple of weeks ago.  Health Maintenance: Immunizations --records requested. Colonoscopy --has never had.  Is agreeable to Cologuard but wants to wait until she is home from  SNF to complete.  She is to contact us when she is ready for [place] to order. Mammogram --overdue.  Order placed. PAP --overdue.  Discussed we will need to update this either here or with gynecology.  She declines at present. Bone Density --overdue.  Order placed.  Past Medical History:  Diagnosis Date  . Anemia   . Cancer ([place])   . Carotid arterial disease ([place]) 07/2019  . Diabetes ([place])   . HTN (hypertension)   . Hyperlipidemia   . Stroke [facility])    acute/subacute left MCA infarct 08/12/19    Past Surgical History:  Procedure Laterality Date  . CYSTOSCOPY WITH FULGERATION N/A 09/01/2019   Procedure: CYSTOSCOPY CLOT EVACUATION WITH POSSIBLE FULGERATION;  Surgeon: Robley Fries, MD;  Location: [facility];  Service: Urology;  Laterality: N/A;  . CYSTOSCOPY WITH FULGERATION N/A 09/28/2019   Procedure: [place] AND CLOT EVACUATION;  Surgeon: Robley Fries, MD;  Location: [facility];  Service: Urology;  Laterality: N/A;  . IR IVC FILTER PLMT / S&I Burke Keels GUID/MOD SED  10/07/2019  . TONSILLECTOMY    . TRANSCAROTID ARTERY REVASCULARIZATION Left 08/20/2019   Procedure: LEFT TRANSCAROTID ARTERY REVASCULARIZATION;  Surgeon: Waynetta Sandy, MD;  Location: [facility];  Service: Vascular;  Laterality: Left;  . TRANSURETHRAL RESECTION OF BLADDER TUMOR WITH MITOMYCIN-C N/A 09/22/2019   Procedure: TRANSURETHRAL RESECTION OF BLADDER TUMOR WITH INTRAVESICAL GEMCITABINE;  Surgeon: Robley Fries, MD;  Location: [facility];  Service: Urology;  Laterality: N/A;  90 MINS    Current Outpatient Medications on File Prior to Visit  Medication  Sig Dispense Refill  . amLODipine (NORVASC) 10 MG tablet Take 1 tablet (10 mg total) by mouth daily. 30 tablet 0  . aspirin EC 81 MG EC tablet Take 1 tablet (81 mg total) by mouth daily. Swallow whole. 30 tablet 0  . atorvastatin (LIPITOR) 80 MG tablet Take 1 tablet (80 mg total) by mouth daily. 30 tablet 0  . cephALEXin (KEFLEX) 500 MG capsule Take 500 mg  by mouth 3 (three) times daily.    Marland Kitchen losartan (COZAAR) 25 MG tablet Take 25 mg by mouth in the morning.     . melatonin 5 MG TABS Take 10 mg by mouth at bedtime.    . metFORMIN (GLUCOPHAGE XR) 500 MG 24 hr tablet Take 1 tablet (500 mg total) by mouth daily with breakfast. 30 tablet 0  . MULTIPLE VITAMIN PO Take 1 tablet by mouth daily.    Marland Kitchen terbinafine (LAMISIL) 250 MG tablet Take 250 mg by mouth daily.    . clopidogrel (PLAVIX) 75 MG tablet Take 1 tablet (75 mg total) by mouth daily. (Patient not taking: Reported on 01/06/2020) 30 tablet 6   No current facility-administered medications on file prior to visit.    No Known Allergies  Family History  Problem Relation Age of Onset  . Hypertension Mother   . Lung cancer Father     Social History   Socioeconomic History  . Marital status: Married    Spouse name: Not on file  . Number of children: Not on file  . Years of education: Not on file  . Highest education level: Not on file  Occupational History  . Not on file  Tobacco Use  . Smoking status: Former Smoker    Packs/day: 0.50    Types: Cigarettes    Quit date: 07/28/2019    Years since quitting: 0.4  . Smokeless tobacco: Never Used  Vaping Use  . Vaping Use: Never used  Substance and Sexual Activity  . Alcohol use: Not Currently  . Drug use: Never  . Sexual activity: Yes  Other Topics Concern  . Not on file  Social History Narrative   Currently [facility] in [place], [place]   Social Determinants of Health   Financial Resource Strain:   . Difficulty of Paying Living Expenses: Not on file  Food Insecurity:   . Worried About Charity fundraiser in the Last Year: Not on file  . Ran Out of Food in the Last Year: Not on file  Transportation Needs:   . Lack of Transportation (Medical): Not on file  . Lack of Transportation (Non-Medical): Not on file  Physical Activity:   . Days of Exercise per Week: Not on file  . Minutes of Exercise per  Session: Not on file  Stress:   . Feeling of Stress : Not on file  Social Connections:   . Frequency of Communication with Friends and Family: Not on file  . Frequency of Social Gatherings with Friends and Family: Not on file  . Attends Religious Services: Not on file  . Active Member of Clubs or Organizations: Not on file  . Attends Archivist Meetings: Not on file  . Marital Status: Not on file  Intimate Partner Violence:   . Fear of Current or Ex-Partner: Not on file  . Emotionally Abused: Not on file  . Physically Abused: Not on file  . Sexually Abused: Not on file    ROS  BP 98/60   Pulse 92  Temp 97.9 F (36.6 C) (Temporal)   Resp 16   Ht 5\' 5"  (1.651 m)   Wt 151 lb (68.5 kg)   SpO2 99%   BMI 25.13 kg/m   Physical Exam  Recent Results (from the past 2160 hour(s))  Glucose, capillary     Status: Abnormal   Collection Time: 10/08/19  4:02 PM  Result Value Ref Range   Glucose-Capillary 120 (H) 70 - 99 mg/dL    Comment: Glucose reference range applies only to samples taken after fasting for at least 8 hours.  Glucose, capillary     Status: Abnormal   Collection Time: 10/08/19  8:50 PM  Result Value Ref Range   Glucose-Capillary 113 (H) 70 - 99 mg/dL    Comment: Glucose reference range applies only to samples taken after fasting for at least 8 hours.  Glucose, capillary     Status: None   Collection Time: 10/09/19  6:50 AM  Result Value Ref Range   Glucose-Capillary 96 70 - 99 mg/dL    Comment: Glucose reference range applies only to samples taken after fasting for at least 8 hours.  Basic metabolic panel     Status: Abnormal   Collection Time: 10/09/19  8:10 AM  Result Value Ref Range   Sodium 139 135 - 145 mmol/L   Potassium 4.0 3.5 - 5.1 mmol/L   Chloride 109 98 - 111 mmol/L   CO2 22 22 - 32 mmol/L   Glucose, Bld 98 70 - 99 mg/dL    Comment: Glucose reference range applies only to samples taken after fasting for at least 8 hours.   BUN <5 (L) 8  - 23 mg/dL   Creatinine, Ser 0.37 (L) 0.44 - 1.00 mg/dL   Calcium 8.4 (L) 8.9 - 10.3 mg/dL   GFR calc non Af Amer >60 >60 mL/min   GFR calc Af Amer >60 >60 mL/min   Anion gap 8 5 - 15    Comment: Performed at [facility] [address], [place], [place] [address]  CBC with Differential/Platelet     Status: Abnormal   Collection Time: 10/09/19  8:10 AM  Result Value Ref Range   WBC 6.9 4.0 - 10.5 K/uL   RBC 3.55 (L) 3.87 - 5.11 MIL/uL   Hemoglobin 9.7 (L) 12.0 - 15.0 g/dL   HCT 31.1 (L) 36 - 46 %   MCV 87.6 80.0 - 100.0 fL   MCH 27.3 26.0 - 34.0 pg   MCHC 31.2 30.0 - 36.0 g/dL   RDW 16.6 (H) 11.5 - 15.5 %   Platelets 424 (H) 150 - 400 K/uL   nRBC 0.0 0.0 - 0.2 %   Neutrophils Relative % 64 %   Neutro Abs 4.5 1.7 - 7.7 K/uL   Lymphocytes Relative 23 %   Lymphs Abs 1.6 0.7 - 4.0 K/uL   Monocytes Relative 9 %   Monocytes Absolute 0.6 0.1 - 1.0 K/uL   Eosinophils Relative 3 %   Eosinophils Absolute 0.2 0.0 - 0.5 K/uL   Basophils Relative 0 %   Basophils Absolute 0.0 0.0 - 0.1 K/uL   Immature Granulocytes 1 %   Abs Immature Granulocytes 0.05 0.00 - 0.07 K/uL    Comment: Performed at [facility] [address], [place], [place] [address]  Glucose, capillary     Status: Abnormal   Collection Time: 10/09/19 11:23 AM  Result Value Ref Range   Glucose-Capillary 101 (H) 70 - 99 mg/dL  Comment: Glucose reference range applies only to samples taken after fasting for at least 8 hours.  Glucose, capillary     Status: Abnormal   Collection Time: 10/09/19  4:50 PM  Result Value Ref Range   Glucose-Capillary 107 (H) 70 - 99 mg/dL    Comment: Glucose reference range applies only to samples taken after fasting for at least 8 hours.  Glucose, capillary     Status: None   Collection Time: 10/09/19  9:34 PM  Result Value Ref Range   Glucose-Capillary 91 70 - 99 mg/dL    Comment: Glucose reference range applies only to samples taken after fasting for at least 8 hours.  Basic  metabolic panel     Status: Abnormal   Collection Time: 10/10/19  4:17 AM  Result Value Ref Range   Sodium 137 135 - 145 mmol/L   Potassium 3.4 (L) 3.5 - 5.1 mmol/L   Chloride 104 98 - 111 mmol/L   CO2 23 22 - 32 mmol/L   Glucose, Bld 91 70 - 99 mg/dL    Comment: Glucose reference range applies only to samples taken after fasting for at least 8 hours.   BUN <5 (L) 8 - 23 mg/dL   Creatinine, Ser 0.48 0.44 - 1.00 mg/dL   Calcium 8.7 (L) 8.9 - 10.3 mg/dL   GFR calc non Af Amer >60 >60 mL/min   GFR calc Af Amer >60 >60 mL/min   Anion gap 10 5 - 15    Comment: Performed at [facility] [address], [place], [place] [address]  Glucose, capillary     Status: None   Collection Time: 10/10/19  6:47 AM  Result Value Ref Range   Glucose-Capillary 94 70 - 99 mg/dL    Comment: Glucose reference range applies only to samples taken after fasting for at least 8 hours.  CBC     Status: Abnormal   Collection Time: 10/10/19  9:00 AM  Result Value Ref Range   WBC 8.5 4.0 - 10.5 K/uL   RBC 3.82 (L) 3.87 - 5.11 MIL/uL   Hemoglobin 10.5 (L) 12.0 - 15.0 g/dL   HCT 33.4 (L) 36 - 46 %   MCV 87.4 80.0 - 100.0 fL   MCH 27.5 26.0 - 34.0 pg   MCHC 31.4 30.0 - 36.0 g/dL   RDW 16.6 (H) 11.5 - 15.5 %   Platelets 504 (H) 150 - 400 K/uL   nRBC 0.0 0.0 - 0.2 %    Comment: Performed at [facility] [address] [place], [place] [address]  Glucose, capillary     Status: None   Collection Time: 10/10/19 11:31 AM  Result Value Ref Range   Glucose-Capillary 96 70 - 99 mg/dL    Comment: Glucose reference range applies only to samples taken after fasting for at least 8 hours.  Glucose, capillary     Status: Abnormal   Collection Time: 10/10/19  4:36 PM  Result Value Ref Range   Glucose-Capillary 154 (H) 70 - 99 mg/dL    Comment: Glucose reference range applies only to samples taken after fasting for at least 8 hours.  Glucose, capillary     Status: Abnormal   Collection Time: 10/10/19  8:43 PM   Result Value Ref Range   Glucose-Capillary 123 (H) 70 - 99 mg/dL    Comment: Glucose reference range applies only to samples taken after fasting for at least 8 hours.  Basic metabolic panel  Status: Abnormal   Collection Time: 10/11/19  8:58 AM  Result Value Ref Range   Sodium 139 135 - 145 mmol/L   Potassium 3.7 3.5 - 5.1 mmol/L   Chloride 102 98 - 111 mmol/L   CO2 26 22 - 32 mmol/L   Glucose, Bld 120 (H) 70 - 99 mg/dL    Comment: Glucose reference range applies only to samples taken after fasting for at least 8 hours.   BUN 7 (L) 8 - 23 mg/dL   Creatinine, Ser 0.52 0.44 - 1.00 mg/dL   Calcium 9.0 8.9 - 10.3 mg/dL   GFR calc non Af Amer >60 >60 mL/min   GFR calc Af Amer >60 >60 mL/min   Anion gap 11 5 - 15    Comment: Performed at [facility] [address] [place], [place] [address]  CBC     Status: Abnormal   Collection Time: 10/11/19  8:58 AM  Result Value Ref Range   WBC 9.2 4.0 - 10.5 K/uL   RBC 3.95 3.87 - 5.11 MIL/uL   Hemoglobin 10.9 (L) 12.0 - 15.0 g/dL   HCT 34.6 (L) 36 - 46 %   MCV 87.6 80.0 - 100.0 fL   MCH 27.6 26.0 - 34.0 pg   MCHC 31.5 30.0 - 36.0 g/dL   RDW 16.6 (H) 11.5 - 15.5 %   Platelets 542 (H) 150 - 400 K/uL   nRBC 0.0 0.0 - 0.2 %    Comment: Performed at [facility], [place] [address] [place], [place] [address]  Glucose, capillary     Status: Abnormal   Collection Time: 10/11/19 11:59 AM  Result Value Ref Range   Glucose-Capillary 142 (H) 70 - 99 mg/dL    Comment: Glucose reference range applies only to samples taken after fasting for at least 8 hours.  Glucose, capillary     Status: Abnormal   Collection Time: 10/11/19  4:50 PM  Result Value Ref Range   Glucose-Capillary 153 (H) 70 - 99 mg/dL    Comment: Glucose reference range applies only to samples taken after fasting for at least 8 hours.  Glucose, capillary     Status: Abnormal   Collection Time: 10/11/19  8:17 PM  Result Value Ref Range   Glucose-Capillary 136 (H) 70 - 99  mg/dL    Comment: Glucose reference range applies only to samples taken after fasting for at least 8 hours.  Glucose, capillary     Status: Abnormal   Collection Time: 10/12/19  8:16 AM  Result Value Ref Range   Glucose-Capillary 172 (H) 70 - 99 mg/dL    Comment: Glucose reference range applies only to samples taken after fasting for at least 8 hours.  Glucose, capillary     Status: Abnormal   Collection Time: 10/12/19 11:50 AM  Result Value Ref Range   Glucose-Capillary 173 (H) 70 - 99 mg/dL    Comment: Glucose reference range applies only to samples taken after fasting for at least 8 hours.  Glucose, capillary     Status: Abnormal   Collection Time: 10/12/19  5:02 PM  Result Value Ref Range   Glucose-Capillary 132 (H) 70 - 99 mg/dL    Comment: Glucose reference range applies only to samples taken after fasting for at least 8 hours.  Glucose, capillary     Status: Abnormal   Collection Time: 10/12/19  9:48 PM  Result Value Ref Range   Glucose-Capillary 138 (H) 70 - 99 mg/dL  Comment: Glucose reference range applies only to samples taken after fasting for at least 8 hours.  CBC     Status: Abnormal   Collection Time: 10/13/19  5:31 AM  Result Value Ref Range   WBC 8.0 4.0 - 10.5 K/uL   RBC 3.81 (L) 3.87 - 5.11 MIL/uL   Hemoglobin 10.6 (L) 12.0 - 15.0 g/dL   HCT 33.8 (L) 36 - 46 %   MCV 88.7 80.0 - 100.0 fL   MCH 27.8 26.0 - 34.0 pg   MCHC 31.4 30.0 - 36.0 g/dL   RDW 16.3 (H) 11.5 - 15.5 %   Platelets 475 (H) 150 - 400 K/uL   nRBC 0.0 0.0 - 0.2 %    Comment: Performed at [facility] [address], [place], [place] [address]  Glucose, capillary     Status: Abnormal   Collection Time: 10/13/19  6:44 AM  Result Value Ref Range   Glucose-Capillary 141 (H) 70 - 99 mg/dL    Comment: Glucose reference range applies only to samples taken after fasting for at least 8 hours.  Glucose, capillary     Status: Abnormal   Collection Time: 10/13/19  4:34 PM  Result Value Ref  Range   Glucose-Capillary 121 (H) 70 - 99 mg/dL    Comment: Glucose reference range applies only to samples taken after fasting for at least 8 hours.  CBC     Status: Abnormal   Collection Time: 10/14/19 12:39 AM  Result Value Ref Range   WBC 10.0 4.0 - 10.5 K/uL   RBC 3.79 (L) 3.87 - 5.11 MIL/uL   Hemoglobin 10.2 (L) 12.0 - 15.0 g/dL   HCT 33.2 (L) 36 - 46 %   MCV 87.6 80.0 - 100.0 fL   MCH 26.9 26.0 - 34.0 pg   MCHC 30.7 30.0 - 36.0 g/dL   RDW 16.3 (H) 11.5 - 15.5 %   Platelets 453 (H) 150 - 400 K/uL   nRBC 0.0 0.0 - 0.2 %    Comment: Performed at [facility] [address] [place], [place] [address]  Glucose, capillary     Status: Abnormal   Collection Time: 10/14/19  6:07 AM  Result Value Ref Range   Glucose-Capillary 135 (H) 70 - 99 mg/dL    Comment: Glucose reference range applies only to samples taken after fasting for at least 8 hours.  Urinalysis, Routine w reflex microscopic Urine, Catheterized     Status: Abnormal   Collection Time: 10/17/19  6:46 PM  Result Value Ref Range   Color, Urine AMBER (A) YELLOW    Comment: BIOCHEMICALS MAY BE AFFECTED BY COLOR   APPearance TURBID (A) CLEAR   Specific Gravity, Urine 1.017 1.005 - 1.030   pH 5.0 5.0 - 8.0   Glucose, UA NEGATIVE NEGATIVE mg/dL   Hgb urine dipstick MODERATE (A) NEGATIVE   Bilirubin Urine NEGATIVE NEGATIVE   Ketones, ur 5 (A) NEGATIVE mg/dL   Protein, ur >=300 (A) NEGATIVE mg/dL   Nitrite POSITIVE (A) NEGATIVE   Leukocytes,Ua LARGE (A) NEGATIVE   RBC / HPF >50 (H) 0 - 5 RBC/hpf   WBC, UA >50 (H) 0 - 5 WBC/hpf   Bacteria, UA MANY (A) NONE SEEN   WBC Clumps PRESENT    Mucus PRESENT    Hyphae Yeast PRESENT    Hyaline Casts, UA PRESENT     Comment: Performed at [facility], [address] [place], [place] [address]  Urine culture  Status: Abnormal   Collection Time: 10/17/19  7:27 PM   Specimen: Urine, Random  Result Value Ref Range   Specimen Description URINE, RANDOM    Special  Requests      NONE Performed at [facility], [address] [place], [place] [address]    Culture >=100,000 COLONIES/mL ESCHERICHIA COLI (A)    Report Status 10/20/2019 FINAL    Organism ID, Bacteria ESCHERICHIA COLI (A)       Susceptibility   Escherichia coli - MIC*    AMPICILLIN 16 INTERMEDIATE Intermediate     CEFAZOLIN <=4 SENSITIVE Sensitive     CEFTRIAXONE <=0.25 SENSITIVE Sensitive     CIPROFLOXACIN <=0.25 SENSITIVE Sensitive     GENTAMICIN <=1 SENSITIVE Sensitive     IMIPENEM <=0.25 SENSITIVE Sensitive     NITROFURANTOIN <=16 SENSITIVE Sensitive     TRIMETH/SULFA <=20 SENSITIVE Sensitive     AMPICILLIN/SULBACTAM 4 SENSITIVE Sensitive     PIP/TAZO <=4 SENSITIVE Sensitive     * >=100,000 COLONIES/mL ESCHERICHIA COLI  Basic metabolic panel     Status: Abnormal   Collection Time: 10/18/19  2:15 AM  Result Value Ref Range   Sodium 134 (L) 135 - 145 mmol/L   Potassium 3.7 3.5 - 5.1 mmol/L   Chloride 98 98 - 111 mmol/L   CO2 25 22 - 32 mmol/L   Glucose, Bld 120 (H) 70 - 99 mg/dL    Comment: Glucose reference range applies only to samples taken after fasting for at least 8 hours.   BUN 10 8 - 23 mg/dL   Creatinine, Ser 0.49 0.44 - 1.00 mg/dL   Calcium 9.1 8.9 - 10.3 mg/dL   GFR calc non Af Amer >60 >60 mL/min   GFR calc Af Amer >60 >60 mL/min   Anion gap 11 5 - 15    Comment: Performed at [facility] [address] [place], [place] [address]  CBC with Differential/Platelet     Status: Abnormal   Collection Time: 10/18/19  2:58 AM  Result Value Ref Range   WBC 12.4 (H) 4.0 - 10.5 K/uL   RBC 3.74 (L) 3.87 - 5.11 MIL/uL   Hemoglobin 10.1 (L) 12.0 - 15.0 g/dL   HCT 32.4 (L) 36 - 46 %   MCV 86.6 80.0 - 100.0 fL   MCH 27.0 26.0 - 34.0 pg   MCHC 31.2 30.0 - 36.0 g/dL   RDW 16.0 (H) 11.5 - 15.5 %   Platelets 352 150 - 400 K/uL   nRBC 0.0 0.0 - 0.2 %   Neutrophils Relative % 77 %   Neutro Abs 9.6 (H) 1.7 - 7.7 K/uL   Lymphocytes Relative 14 %   Lymphs Abs  1.7 0.7 - 4.0 K/uL   Monocytes Relative 7 %   Monocytes Absolute 0.8 0.1 - 1.0 K/uL   Eosinophils Relative 2 %   Eosinophils Absolute 0.2 0.0 - 0.5 K/uL   Basophils Relative 0 %   Basophils Absolute 0.0 0.0 - 0.1 K/uL   Immature Granulocytes 0 %   Abs Immature Granulocytes 0.04 0.00 - 0.07 K/uL    Comment: Performed at [facility], [place] [address], [place], [place] [address]  SARS Coronavirus 2 by RT PCR (hospital order, performed in [facility] hospital lab) Nasopharyngeal Nasopharyngeal Swab     Status: None   Collection Time: 10/21/19  2:45 PM   Specimen: Nasopharyngeal Swab  Result Value Ref Range   SARS Coronavirus 2 NEGATIVE NEGATIVE  Comment: (NOTE) SARS-CoV-2 target nucleic acids are NOT DETECTED.  The SARS-CoV-2 RNA is generally detectable in upper and lower respiratory specimens during the acute phase of infection. The lowest concentration of SARS-CoV-2 viral copies this assay can detect is 250 copies / mL. A negative result does not preclude SARS-CoV-2 infection and should not be used as the sole basis for treatment or other patient management decisions.  A negative result may occur with improper specimen collection / handling, submission of specimen other than nasopharyngeal swab, presence of viral mutation(s) within the areas targeted by this assay, and inadequate number of viral copies (<250 copies / mL). A negative result must be combined with clinical observations, patient history, and epidemiological information.  Fact Sheet for Patients:   StrictlyIdeas.no  Fact Sheet for Healthcare Providers: BankingDealers.co.za  This test is not yet approved or  cleared by the [place] FDA and has been authorized for detection and/or diagnosis of SARS-CoV-2 by FDA under an Emergency Use Authorization (EUA).  This EUA will remain in effect (meaning this test can be used) for the duration of the COVID-19 declaration  under Section 564(b)(1) of the Act, 21 U.S.C. section 360bbb-3(b)(1), unless the authorization is terminated or revoked sooner.  Performed at [facility], [place] [address], [place], [place] [address]     Assessment/Plan: 1. New onset type 2 diabetes mellitus (HCC) Last A1c at 6.5.  Patient to continue Metformin once daily as directed for now along with her losartan and atorvastatin given history of hypertension, hyperlipidemia and history of stroke.  Discussed we need to keep very good glycemic control.  We will repeat labs today to include A1c.  If we can get her out of the diabetic range and keep her out of that range we can discuss weaning off of her Metformin.  We will also get records from skilled nursing facility to see what immunization she has had recently so we can close any leftover care gaps. - Hemoglobin A1c - Lipid panel - Comprehensive metabolic panel  2. Primary hypertension BP lower end of normal today.  Has not eaten or had anything to drink.  Asymptomatic.  Encouraged good hydration and well-balanced diet.  Continue current medication regimen.  Follow-up scheduled.  Continuing to remain lower end of normal will decrease medication doses.  3. Hypercholesteremia Take medications as directed.  Repeat fasting lipids today.  4. Lacunar infarct, acute (HCC) Continue aspirin, antihypertensives and atorvastatin.  Follow-up with neurology as scheduled.  5. Aphasia Continue with speech therapy at her skilled nursing facility 3 times weekly.  Upcoming appointment with neurology.  Continue management per specialist.  6. Lower leg DVT (deep venous thromboembolism), acute, bilateral (HCC) No residual pain.  Swelling has improved on its own.  Has upcoming appointment with her vascular specialist.  Now that she is out from her surgical resection of bladder carcinoma, then may consider restarting a true anticoagulant.  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  Has chemotherapy with urology.  Continue management per specialist.  8. Encounter for screening mammogram for malignant neoplasm of breast Order for screening mammogram placed.  9. Colon cancer screening She is interested in Cologuard.  Wants to defer until she is out of her skilled nursing facility.  She is going to contact us when she is ready for this.  10. Postmenopausal estrogen deficiency Order for DEXA placed.    This visit occurred during the SARS-CoV-2 public health emergency.  Safety protocols were in place,  including screening questions prior to the visit, additional usage of staff PPE, and extensive cleaning of exam room while observing appropriate contact time as indicated for disinfecting solutions.     Leeanne Rio, PA-C

## 2020-01-06 NOTE — Patient Instructions (Signed)
Please continue medications as directed by her specialist. Try to keep a well-balanced diet, low in sweets. Try to get plenty of rest at night. Read information below on sleep hygiene practices to help with this.  I am rechecking your labs today to see if you are still in the diabetic range. Hopefully we have moved down to the prediabetic range and will no longer need medication.  Please go to the lab today for blood work.  I will call you with your results. We will alter treatment regimen(s) if indicated by your results.   Once you are home from the nursing facility we will work on updating your colon cancer screening, breast cancer screening and osteoporosis screening. We will reach out to your facility to see what immunizations you have been given and update accordingly.  It was very nice meeting you today. Welcome to [facility]!

## 2020-01-09 ENCOUNTER — Other Ambulatory Visit (HOSPITAL_COMMUNITY)
Admission: RE | Admit: 2020-01-09 | Discharge: 2020-01-09 | Disposition: A | Discharge status: Home / Self Care | Payer: Medicare Other | Source: Ambulatory Visit | Attending: Cardiology | Admitting: Cardiology

## 2020-01-09 DIAGNOSIS — Z01812 Encounter for preprocedural laboratory examination: Secondary | ICD-10-CM | POA: Insufficient documentation

## 2020-01-09 DIAGNOSIS — Z20822 Contact with and (suspected) exposure to covid-19: Secondary | ICD-10-CM | POA: Diagnosis not present

## 2020-01-10 LAB — SARS CORONAVIRUS 2 (TAT 6-24 HRS): SARS Coronavirus 2: NEGATIVE

## 2020-01-11 DIAGNOSIS — I69820 Aphasia following other cerebrovascular disease: Secondary | ICD-10-CM | POA: Diagnosis not present

## 2020-01-11 DIAGNOSIS — Z87891 Personal history of nicotine dependence: Secondary | ICD-10-CM | POA: Diagnosis not present

## 2020-01-12 ENCOUNTER — Telehealth: Payer: Self-pay | Admitting: Cardiology

## 2020-01-12 DIAGNOSIS — I69820 Aphasia following other cerebrovascular disease: Secondary | ICD-10-CM | POA: Diagnosis not present

## 2020-01-12 DIAGNOSIS — Z87891 Personal history of nicotine dependence: Secondary | ICD-10-CM | POA: Diagnosis not present

## 2020-01-12 DIAGNOSIS — R269 Unspecified abnormalities of gait and mobility: Secondary | ICD-10-CM | POA: Diagnosis not present

## 2020-01-12 DIAGNOSIS — R531 Weakness: Secondary | ICD-10-CM | POA: Diagnosis not present

## 2020-01-12 NOTE — Telephone Encounter (Signed)
Left a message for the patient to call back.  

## 2020-01-12 NOTE — Telephone Encounter (Signed)
     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 She would like to speak with Hayley to r/s everything

## 2020-01-13 ENCOUNTER — Encounter (HOSPITAL_COMMUNITY): Admission: RE | Payer: Self-pay | Source: Home / Self Care

## 2020-01-13 ENCOUNTER — Ambulatory Visit (HOSPITAL_COMMUNITY): Admission: RE | Admit: 2020-01-13 | Payer: Medicare Other | Source: Home / Self Care | Admitting: Cardiology

## 2020-01-13 SURGERY — ECHOCARDIOGRAM, TRANSESOPHAGEAL
Anesthesia: Monitor Anesthesia Care

## 2020-01-13 NOTE — Telephone Encounter (Signed)
Spoke to husband (ok per DPR)-rescheduled TEE+labs/covid test   TEE scheduled Friday 12/10 at 10 AM (arrival 9am) with Dr. Johnsie Cancel Tuesday 12/7 at 11:45 AM Covid test Labs Tuesday 12/7 before covid test at [facility] office.    Placed new instructions at front desk for husband to pick up tomorrow.

## 2020-01-14 DIAGNOSIS — Z87891 Personal history of nicotine dependence: Secondary | ICD-10-CM | POA: Diagnosis not present

## 2020-01-14 DIAGNOSIS — I69820 Aphasia following other cerebrovascular disease: Secondary | ICD-10-CM | POA: Diagnosis not present

## 2020-01-18 DIAGNOSIS — C679 Malignant neoplasm of bladder, unspecified: Secondary | ICD-10-CM | POA: Diagnosis not present

## 2020-01-18 DIAGNOSIS — I10 Essential (primary) hypertension: Secondary | ICD-10-CM | POA: Diagnosis not present

## 2020-01-18 DIAGNOSIS — R4701 Aphasia: Secondary | ICD-10-CM | POA: Diagnosis not present

## 2020-01-18 DIAGNOSIS — D649 Anemia, unspecified: Secondary | ICD-10-CM | POA: Diagnosis not present

## 2020-01-18 DIAGNOSIS — Z87891 Personal history of nicotine dependence: Secondary | ICD-10-CM | POA: Diagnosis not present

## 2020-01-18 DIAGNOSIS — I69820 Aphasia following other cerebrovascular disease: Secondary | ICD-10-CM | POA: Diagnosis not present

## 2020-01-18 DIAGNOSIS — E119 Type 2 diabetes mellitus without complications: Secondary | ICD-10-CM | POA: Diagnosis not present

## 2020-01-19 ENCOUNTER — Other Ambulatory Visit (HOSPITAL_COMMUNITY): Payer: Medicare Other

## 2020-01-20 DIAGNOSIS — Z87891 Personal history of nicotine dependence: Secondary | ICD-10-CM | POA: Diagnosis not present

## 2020-01-20 DIAGNOSIS — I69820 Aphasia following other cerebrovascular disease: Secondary | ICD-10-CM | POA: Diagnosis not present

## 2020-01-21 DIAGNOSIS — I69820 Aphasia following other cerebrovascular disease: Secondary | ICD-10-CM | POA: Diagnosis not present

## 2020-01-21 DIAGNOSIS — Z87891 Personal history of nicotine dependence: Secondary | ICD-10-CM | POA: Diagnosis not present

## 2020-01-21 NOTE — Progress Notes (Signed)
Patient has not had Covid test. Was scheduled for 12/7 and husband forgot. Advised that Dr Kyla Balzarine office will need to be called and the appointment rescheduled, husband understands.

## 2020-01-22 ENCOUNTER — Ambulatory Visit (HOSPITAL_COMMUNITY): Admission: RE | Admit: 2020-01-22 | Payer: Medicare Other | Source: Home / Self Care | Admitting: Cardiovascular Disease

## 2020-01-22 ENCOUNTER — Encounter (HOSPITAL_COMMUNITY): Admission: RE | Payer: Self-pay | Source: Home / Self Care

## 2020-01-22 SURGERY — ECHOCARDIOGRAM, TRANSESOPHAGEAL
Anesthesia: Monitor Anesthesia Care

## 2020-01-25 ENCOUNTER — Inpatient Hospital Stay: Payer: Medicare Other | Admitting: Neurology

## 2020-01-25 DIAGNOSIS — I69820 Aphasia following other cerebrovascular disease: Secondary | ICD-10-CM | POA: Diagnosis not present

## 2020-01-25 DIAGNOSIS — Z87891 Personal history of nicotine dependence: Secondary | ICD-10-CM | POA: Diagnosis not present

## 2020-01-27 DIAGNOSIS — I69820 Aphasia following other cerebrovascular disease: Secondary | ICD-10-CM | POA: Diagnosis not present

## 2020-01-27 DIAGNOSIS — Z87891 Personal history of nicotine dependence: Secondary | ICD-10-CM | POA: Diagnosis not present

## 2020-01-28 ENCOUNTER — Telehealth: Payer: Self-pay | Admitting: Cardiology

## 2020-01-28 DIAGNOSIS — B351 Tinea unguium: Secondary | ICD-10-CM | POA: Diagnosis not present

## 2020-01-28 DIAGNOSIS — E119 Type 2 diabetes mellitus without complications: Secondary | ICD-10-CM | POA: Diagnosis not present

## 2020-01-28 DIAGNOSIS — D649 Anemia, unspecified: Secondary | ICD-10-CM | POA: Diagnosis not present

## 2020-01-28 DIAGNOSIS — R4701 Aphasia: Secondary | ICD-10-CM | POA: Diagnosis not present

## 2020-01-28 DIAGNOSIS — I69820 Aphasia following other cerebrovascular disease: Secondary | ICD-10-CM | POA: Diagnosis not present

## 2020-01-28 DIAGNOSIS — C679 Malignant neoplasm of bladder, unspecified: Secondary | ICD-10-CM | POA: Diagnosis not present

## 2020-01-28 DIAGNOSIS — Z87891 Personal history of nicotine dependence: Secondary | ICD-10-CM | POA: Diagnosis not present

## 2020-01-28 NOTE — Telephone Encounter (Signed)
Patient's Husband called again to try and get the TEE Rescheduled. Please call back

## 2020-01-28 NOTE — Telephone Encounter (Signed)
Patient's husband calling to reschedule her procedure.

## 2020-02-01 DIAGNOSIS — R0989 Other specified symptoms and signs involving the circulatory and respiratory systems: Secondary | ICD-10-CM | POA: Diagnosis not present

## 2020-02-01 DIAGNOSIS — I6523 Occlusion and stenosis of bilateral carotid arteries: Secondary | ICD-10-CM | POA: Diagnosis not present

## 2020-02-02 NOTE — Telephone Encounter (Signed)
Attempt to call husband (ok per DPR)-left message to call back  Advised will need another OV prior to rescheduling TEE.   OV 11/9 with Dr. Gardiner Rhyme TEE 12/1-cancelled by husband TEE 12/10-cancelled due to not getting covid test or labs

## 2020-02-03 DIAGNOSIS — Z87891 Personal history of nicotine dependence: Secondary | ICD-10-CM | POA: Diagnosis not present

## 2020-02-03 DIAGNOSIS — I69820 Aphasia following other cerebrovascular disease: Secondary | ICD-10-CM | POA: Diagnosis not present

## 2020-02-03 NOTE — Telephone Encounter (Signed)
Appointment scheduled 1/7 with Dr. Gardiner Rhyme

## 2020-02-08 ENCOUNTER — Encounter: Payer: Self-pay | Admitting: Physician Assistant

## 2020-02-08 DIAGNOSIS — I69820 Aphasia following other cerebrovascular disease: Secondary | ICD-10-CM | POA: Diagnosis not present

## 2020-02-08 DIAGNOSIS — C678 Malignant neoplasm of overlapping sites of bladder: Secondary | ICD-10-CM | POA: Diagnosis not present

## 2020-02-08 DIAGNOSIS — Z87891 Personal history of nicotine dependence: Secondary | ICD-10-CM | POA: Diagnosis not present

## 2020-02-08 DIAGNOSIS — R8271 Bacteriuria: Secondary | ICD-10-CM | POA: Diagnosis not present

## 2020-02-09 DIAGNOSIS — Z87891 Personal history of nicotine dependence: Secondary | ICD-10-CM | POA: Diagnosis not present

## 2020-02-09 DIAGNOSIS — I69820 Aphasia following other cerebrovascular disease: Secondary | ICD-10-CM | POA: Diagnosis not present

## 2020-02-11 DIAGNOSIS — I69820 Aphasia following other cerebrovascular disease: Secondary | ICD-10-CM | POA: Diagnosis not present

## 2020-02-11 DIAGNOSIS — Z87891 Personal history of nicotine dependence: Secondary | ICD-10-CM | POA: Diagnosis not present

## 2020-02-12 DIAGNOSIS — R269 Unspecified abnormalities of gait and mobility: Secondary | ICD-10-CM | POA: Diagnosis not present

## 2020-02-12 DIAGNOSIS — R531 Weakness: Secondary | ICD-10-CM | POA: Diagnosis not present

## 2020-02-15 DIAGNOSIS — Z87891 Personal history of nicotine dependence: Secondary | ICD-10-CM | POA: Diagnosis not present

## 2020-02-15 DIAGNOSIS — I69820 Aphasia following other cerebrovascular disease: Secondary | ICD-10-CM | POA: Diagnosis not present

## 2020-02-15 NOTE — Progress Notes (Deleted)
Cardiology Office Note:    Date:  02/15/2020   ID:  Lisa [name], DOB 1950/08/19, MRN [id number]  PCP:  Waldon Merl, PA-C  Cardiologist:  Little Ishikawa, MD  Electrophysiologist:  None   Referring MD: Waldon Merl, PA-C   No chief complaint on file.   History of Present Illness:    Lisa [name] is a 70 y.o. female with a hx of bladder cancer, bilateral DVTs, CVA, hypertension, hyperlipidemia, T2DM who presents for follow-up.  She was admitted July 2021 with acute CVA.  Found to have 80-99% left ICA stenosis.  Echocardiogram 7/1 showed normal biventricular function, linear mobile echodensity on the ventricular aspect of the aortic valve.  TEE was recommended but patient left AMA.  She underwent left carotid artery stenting on 08/20/2019.  She was readmitted from 7/19 through 09/07/2019 after presenting with hematuria, found to have bladder cancer.  Subsequently was readmitted from 8/5 through 10/03/2019 with acute blood loss anemia from hematuria from bladder cancer.  Also found to have another acute stroke.  Repeat echo that admission did not show echodensity.  She was also found bilateral DVTs.  Subsequently was admitted again from 8/23 through 10/14/2019 with anemia from hematuria.  Her anticoagulation and antiplatelet agents were held and urology performed clot irrigation with balloon tamponade with successful control of bleed.  IVC filter placed given recurrent bladder bleeds.  She was discharged on aspirin alone, as her apixaban and clopidogrel were discontinued.  At last appointment, TEE was scheduled but she did not proceed with procedure.  Since her discharge, she reports that she has been doing well.  States that hematuria has resolved.  Currently just on aspirin 81 mg daily.  Underwent resection of bladder cancer, currently on intravesicular chemotherapy.  She reports no deficits from her recent strokes.  Denies any chest pain, dyspnea, lightheadedness, syncope, or  palpitations.  Past Medical History:  Diagnosis Date  . Anemia   . Cancer (HCC)   . Carotid arterial disease (HCC) 07/2019  . Diabetes (HCC)   . HTN (hypertension)   . Hyperlipidemia   . Stroke [facility])    acute/subacute left MCA infarct 08/12/19    Past Surgical History:  Procedure Laterality Date  . CYSTOSCOPY WITH FULGERATION N/A 09/01/2019   Procedure: CYSTOSCOPY CLOT EVACUATION WITH POSSIBLE FULGERATION;  Surgeon: Noel Christmas, MD;  Location: [facility] OR;  Service: Urology;  Laterality: N/A;  . CYSTOSCOPY WITH FULGERATION N/A 09/28/2019   Procedure: CYSTOSCOPY WITH FULGERATION AND CLOT EVACUATION;  Surgeon: Noel Christmas, MD;  Location: [facility] OR;  Service: Urology;  Laterality: N/A;  . IR IVC FILTER PLMT / S&I Lenise Arena GUID/MOD SED  10/07/2019  . TONSILLECTOMY    . TRANSCAROTID ARTERY REVASCULARIZATION Left 08/20/2019   Procedure: LEFT TRANSCAROTID ARTERY REVASCULARIZATION;  Surgeon: Maeola Harman, MD;  Location: [facility] OR;  Service: Vascular;  Laterality: Left;  . TRANSURETHRAL RESECTION OF BLADDER TUMOR WITH MITOMYCIN-C N/A 09/22/2019   Procedure: TRANSURETHRAL RESECTION OF BLADDER TUMOR WITH INTRAVESICAL GEMCITABINE;  Surgeon: Noel Christmas, MD;  Location: [facility] ORS;  Service: Urology;  Laterality: N/A;  90 MINS    Current Medications: No outpatient medications have been marked as taking for the 02/19/20 encounter (Appointment) with Little Ishikawa, MD.     Allergies:   Patient has no known allergies.   Social History   Socioeconomic History  . Marital status: Married    Spouse name: Not on file  . Number of children: Not on file  .  Years of education: Not on file  . Highest education level: Not on file  Occupational History  . Not on file  Tobacco Use  . Smoking status: Former Smoker    Packs/day: 0.50    Types: Cigarettes    Quit date: 07/28/2019    Years since quitting: 0.5  . Smokeless tobacco: Never Used  Vaping Use  . Vaping Use: Never used   Substance and Sexual Activity  . Alcohol use: Not Currently  . Drug use: Never  . Sexual activity: Yes  Other Topics Concern  . Not on file  Social History Narrative   Currently living at [facility] in [place], [place]   Social Determinants of Health   Financial Resource Strain: Not on file  Food Insecurity: Not on file  Transportation Needs: Not on file  Physical Activity: Not on file  Stress: Not on file  Social Connections: Not on file     Family History: The patient's family history includes Hypertension in her mother; Lung cancer in her father.  ROS:   Please see the history of present illness.     All other systems reviewed and are negative.  EKGs/Labs/Other Studies Reviewed:    The following studies were reviewed today:   EKG:  EKG is  ordered today.  The ekg ordered today demonstrates sinus rhythm, PACs,, Q waves in V1/2  Recent Labs: 09/02/2019: Magnesium 1.5 10/18/2019: Hemoglobin 10.1; Platelets 352 01/06/2020: ALT 12; BUN 11; Creatinine, Ser 0.57; Potassium 3.9; Sodium 139  Recent Lipid Panel    Component Value Date/Time   CHOL 157 01/06/2020 1119   TRIG 97.0 01/06/2020 1119   HDL 74.70 01/06/2020 1119   CHOLHDL 2 01/06/2020 1119   VLDL 19.4 01/06/2020 1119   LDLCALC 63 01/06/2020 1119    Physical Exam:    VS:  There were no vitals taken for this visit.    Wt Readings from Last 3 Encounters:  01/06/20 151 lb (68.5 kg)  12/25/19 151 lb (68.5 kg)  12/22/19 150 lb 12.8 oz (68.4 kg)     GEN:   in no acute distress HEENT: Normal NECK: No JVD; No carotid bruits CARDIAC: RRR, no murmurs, rubs, gallops RESPIRATORY:  Clear to auscultation without rales, wheezing or rhonchi  ABDOMEN: Soft, non-tender, non-distended MUSCULOSKELETAL:  No edema; No deformity  SKIN: Warm and dry NEUROLOGIC:  Alert and oriented x 3 PSYCHIATRIC:  Normal affect   ASSESSMENT:    No diagnosis found. PLAN:    Acute CVA: Admitted with an acute left MCA  stroke 6/30.  Found to have severe left ICA stenosis status post stenting.  Subsequently admitted with an acute lacunar stroke 8/5.  TTE 7/1 showed normal biventricular function, linear mobile echodensity on the ventricular aspect of the aortic valve.  TEE was recommended but patient left AMA -She is now agreeable to undergoing TEE.  Will schedule -Zio patch x2 weeks  Bilateral DVTs: IVC filter placed.  Not on anticoagulation due to recurrent hematuria from bladder cancer  Bladder cancer: Status post resection, currently undergoing intravesicular chemotherapy  Hypertension: On losartan 25 mg daily, appears controlled  Hyperlipidemia: On atorvastatin 80 mg daily, LDL 46 on 09/18/2018  Carotid stenosis:  Found to have 80-99% left ICA stenosis.  She underwent left carotid artery stenting on 08/20/2019.   Continue aspirin and statin  RTC in ***   Medication Adjustments/Labs and Tests Ordered: Current medicines are reviewed at length with the patient today.  Concerns regarding medicines are outlined above.  No  orders of the defined types were placed in this encounter.  No orders of the defined types were placed in this encounter.   There are no Patient Instructions on file for this visit.   Signed, Donato Heinz, MD  02/15/2020 9:20 AM    [place]

## 2020-02-16 DIAGNOSIS — Z87891 Personal history of nicotine dependence: Secondary | ICD-10-CM | POA: Diagnosis not present

## 2020-02-16 DIAGNOSIS — I69820 Aphasia following other cerebrovascular disease: Secondary | ICD-10-CM | POA: Diagnosis not present

## 2020-02-18 DIAGNOSIS — I69820 Aphasia following other cerebrovascular disease: Secondary | ICD-10-CM | POA: Diagnosis not present

## 2020-02-18 DIAGNOSIS — Z87891 Personal history of nicotine dependence: Secondary | ICD-10-CM | POA: Diagnosis not present

## 2020-02-19 ENCOUNTER — Ambulatory Visit: Payer: Medicare Other | Admitting: Cardiology

## 2020-02-22 ENCOUNTER — Telehealth: Payer: Self-pay | Admitting: Physician Assistant

## 2020-02-22 DIAGNOSIS — C678 Malignant neoplasm of overlapping sites of bladder: Secondary | ICD-10-CM | POA: Diagnosis not present

## 2020-02-22 NOTE — Telephone Encounter (Signed)
Social worker called and needs to talk to [place] about patient being transferred from [facility] to [facility] - Please call at your convenience

## 2020-02-23 DIAGNOSIS — Z87891 Personal history of nicotine dependence: Secondary | ICD-10-CM | POA: Diagnosis not present

## 2020-02-23 DIAGNOSIS — I69820 Aphasia following other cerebrovascular disease: Secondary | ICD-10-CM | POA: Diagnosis not present

## 2020-02-23 NOTE — Telephone Encounter (Signed)
Spoke with Herschel Senegal and Hassan Rowan with [facility] (social workers)  In the process of transitioning patient from [facility] to [facility] in [place] which is a more of a independent living community but with supervision of medications.  Discussed she was last seen at the end of November so I am willing to send medications to new facility once she is transferred there.  Awaiting paperwork from their office with recent [facility] from [facility] [place].

## 2020-02-25 DIAGNOSIS — I69820 Aphasia following other cerebrovascular disease: Secondary | ICD-10-CM | POA: Diagnosis not present

## 2020-02-25 DIAGNOSIS — Z87891 Personal history of nicotine dependence: Secondary | ICD-10-CM | POA: Diagnosis not present

## 2020-03-01 ENCOUNTER — Telehealth: Payer: Self-pay | Admitting: Physician Assistant

## 2020-03-01 ENCOUNTER — Other Ambulatory Visit: Payer: Self-pay | Admitting: Physician Assistant

## 2020-03-01 MED ORDER — DOCUSATE SODIUM 100 MG PO CAPS: 100.0000 mg | ORAL_CAPSULE | Freq: Every day | ORAL | 1 refills | Status: DC

## 2020-03-01 MED ORDER — MELATONIN 10 MG PO TABS: 10.0000 mg | ORAL_TABLET | Freq: Every day | ORAL | 1 refills | Status: DC

## 2020-03-01 MED ORDER — TRAZODONE HCL 50 MG PO TABS: 50.0000 mg | ORAL_TABLET | Freq: Every day | ORAL | 0 refills | Status: DC

## 2020-03-01 MED ORDER — ASPIRIN 81 MG PO TBEC: 81.0000 mg | DELAYED_RELEASE_TABLET | Freq: Every day | ORAL | 1 refills | Status: DC

## 2020-03-01 MED ORDER — METFORMIN HCL ER 500 MG PO TB24: 500.0000 mg | ORAL_TABLET | Freq: Every day | ORAL | 0 refills | Status: DC

## 2020-03-01 MED ORDER — LOSARTAN POTASSIUM 25 MG PO TABS: 25.0000 mg | ORAL_TABLET | Freq: Every morning | ORAL | 1 refills | Status: DC

## 2020-03-01 MED ORDER — CLOPIDOGREL BISULFATE 75 MG PO TABS: 75.0000 mg | ORAL_TABLET | Freq: Every day | ORAL | 6 refills | Status: DC

## 2020-03-01 MED ORDER — AMLODIPINE BESYLATE 10 MG PO TABS: 10.0000 mg | ORAL_TABLET | Freq: Every day | ORAL | 0 refills | Status: DC

## 2020-03-01 MED ORDER — ATORVASTATIN CALCIUM 20 MG PO TABS: 20.0000 mg | ORAL_TABLET | Freq: Every day | ORAL | 1 refills | Status: DC

## 2020-03-01 MED ORDER — POLYETHYLENE GLYCOL 3350 17 G PO PACK
17.0000 g | PACK | Freq: Every day | ORAL | 2 refills | Status: DC | PRN
Start: 2020-03-01 — End: 2020-05-19

## 2020-03-01 NOTE — Telephone Encounter (Signed)
All Rx have been sent.

## 2020-03-01 NOTE — Telephone Encounter (Signed)
They need all her meds sent to [facility], she is going to be moving to an assisting living towards the end of the week.   Please advise

## 2020-03-01 NOTE — Telephone Encounter (Signed)
I have received the updated MAR via e-mail from the social worker to make sure proper medications are sent in. I am taking care of that

## 2020-03-07 DIAGNOSIS — R8271 Bacteriuria: Secondary | ICD-10-CM | POA: Diagnosis not present

## 2020-03-07 DIAGNOSIS — C678 Malignant neoplasm of overlapping sites of bladder: Secondary | ICD-10-CM | POA: Diagnosis not present

## 2020-03-09 DIAGNOSIS — Z7984 Long term (current) use of oral hypoglycemic drugs: Secondary | ICD-10-CM | POA: Diagnosis not present

## 2020-03-09 DIAGNOSIS — Z7902 Long term (current) use of antithrombotics/antiplatelets: Secondary | ICD-10-CM | POA: Diagnosis not present

## 2020-03-09 DIAGNOSIS — Z86718 Personal history of other venous thrombosis and embolism: Secondary | ICD-10-CM | POA: Diagnosis not present

## 2020-03-09 DIAGNOSIS — G4700 Insomnia, unspecified: Secondary | ICD-10-CM | POA: Diagnosis not present

## 2020-03-09 DIAGNOSIS — I69320 Aphasia following cerebral infarction: Secondary | ICD-10-CM | POA: Diagnosis not present

## 2020-03-09 DIAGNOSIS — Z951 Presence of aortocoronary bypass graft: Secondary | ICD-10-CM | POA: Diagnosis not present

## 2020-03-09 DIAGNOSIS — K5900 Constipation, unspecified: Secondary | ICD-10-CM | POA: Diagnosis not present

## 2020-03-09 DIAGNOSIS — I69351 Hemiplegia and hemiparesis following cerebral infarction affecting right dominant side: Secondary | ICD-10-CM | POA: Diagnosis not present

## 2020-03-09 DIAGNOSIS — I359 Nonrheumatic aortic valve disorder, unspecified: Secondary | ICD-10-CM | POA: Diagnosis not present

## 2020-03-09 DIAGNOSIS — E785 Hyperlipidemia, unspecified: Secondary | ICD-10-CM | POA: Diagnosis not present

## 2020-03-09 DIAGNOSIS — Z87440 Personal history of urinary (tract) infections: Secondary | ICD-10-CM | POA: Diagnosis not present

## 2020-03-09 DIAGNOSIS — Z87891 Personal history of nicotine dependence: Secondary | ICD-10-CM | POA: Diagnosis not present

## 2020-03-09 DIAGNOSIS — I10 Essential (primary) hypertension: Secondary | ICD-10-CM | POA: Diagnosis not present

## 2020-03-09 DIAGNOSIS — E119 Type 2 diabetes mellitus without complications: Secondary | ICD-10-CM | POA: Diagnosis not present

## 2020-03-09 DIAGNOSIS — D630 Anemia in neoplastic disease: Secondary | ICD-10-CM | POA: Diagnosis not present

## 2020-03-09 DIAGNOSIS — C679 Malignant neoplasm of bladder, unspecified: Secondary | ICD-10-CM | POA: Diagnosis not present

## 2020-03-09 DIAGNOSIS — L989 Disorder of the skin and subcutaneous tissue, unspecified: Secondary | ICD-10-CM | POA: Diagnosis not present

## 2020-03-09 DIAGNOSIS — Z7982 Long term (current) use of aspirin: Secondary | ICD-10-CM | POA: Diagnosis not present

## 2020-03-10 ENCOUNTER — Telehealth: Payer: Self-pay | Admitting: Physician Assistant

## 2020-03-10 NOTE — Telephone Encounter (Signed)
Hershal Coria with [facility] called in asking for verbal orders to see pt for Speech Therapy 1X a wk for 1wk and 2X a wk for 4wks, she also wanted to add in OT and social work.  Please advise   Ok to LM on [phone] if no answer.

## 2020-03-11 NOTE — Telephone Encounter (Signed)
Ok for verbal order  °

## 2020-03-11 NOTE — Telephone Encounter (Signed)
Left detailed message on Lisa [name] VM of verbal orders/request

## 2020-03-14 DIAGNOSIS — Z5111 Encounter for antineoplastic chemotherapy: Secondary | ICD-10-CM | POA: Diagnosis not present

## 2020-03-14 DIAGNOSIS — R269 Unspecified abnormalities of gait and mobility: Secondary | ICD-10-CM | POA: Diagnosis not present

## 2020-03-14 DIAGNOSIS — R531 Weakness: Secondary | ICD-10-CM | POA: Diagnosis not present

## 2020-03-14 DIAGNOSIS — C678 Malignant neoplasm of overlapping sites of bladder: Secondary | ICD-10-CM | POA: Diagnosis not present

## 2020-03-16 DIAGNOSIS — L989 Disorder of the skin and subcutaneous tissue, unspecified: Secondary | ICD-10-CM | POA: Diagnosis not present

## 2020-03-16 DIAGNOSIS — D630 Anemia in neoplastic disease: Secondary | ICD-10-CM | POA: Diagnosis not present

## 2020-03-16 DIAGNOSIS — Z87440 Personal history of urinary (tract) infections: Secondary | ICD-10-CM | POA: Diagnosis not present

## 2020-03-16 DIAGNOSIS — Z951 Presence of aortocoronary bypass graft: Secondary | ICD-10-CM | POA: Diagnosis not present

## 2020-03-16 DIAGNOSIS — I69320 Aphasia following cerebral infarction: Secondary | ICD-10-CM | POA: Diagnosis not present

## 2020-03-16 DIAGNOSIS — Z7982 Long term (current) use of aspirin: Secondary | ICD-10-CM | POA: Diagnosis not present

## 2020-03-16 DIAGNOSIS — G4700 Insomnia, unspecified: Secondary | ICD-10-CM | POA: Diagnosis not present

## 2020-03-16 DIAGNOSIS — Z86718 Personal history of other venous thrombosis and embolism: Secondary | ICD-10-CM | POA: Diagnosis not present

## 2020-03-16 DIAGNOSIS — E785 Hyperlipidemia, unspecified: Secondary | ICD-10-CM | POA: Diagnosis not present

## 2020-03-16 DIAGNOSIS — Z87891 Personal history of nicotine dependence: Secondary | ICD-10-CM | POA: Diagnosis not present

## 2020-03-16 DIAGNOSIS — I69351 Hemiplegia and hemiparesis following cerebral infarction affecting right dominant side: Secondary | ICD-10-CM | POA: Diagnosis not present

## 2020-03-16 DIAGNOSIS — K5900 Constipation, unspecified: Secondary | ICD-10-CM | POA: Diagnosis not present

## 2020-03-16 DIAGNOSIS — I10 Essential (primary) hypertension: Secondary | ICD-10-CM | POA: Diagnosis not present

## 2020-03-16 DIAGNOSIS — C679 Malignant neoplasm of bladder, unspecified: Secondary | ICD-10-CM | POA: Diagnosis not present

## 2020-03-16 DIAGNOSIS — Z7984 Long term (current) use of oral hypoglycemic drugs: Secondary | ICD-10-CM | POA: Diagnosis not present

## 2020-03-16 DIAGNOSIS — E119 Type 2 diabetes mellitus without complications: Secondary | ICD-10-CM | POA: Diagnosis not present

## 2020-03-16 DIAGNOSIS — Z7902 Long term (current) use of antithrombotics/antiplatelets: Secondary | ICD-10-CM | POA: Diagnosis not present

## 2020-03-16 DIAGNOSIS — I359 Nonrheumatic aortic valve disorder, unspecified: Secondary | ICD-10-CM | POA: Diagnosis not present

## 2020-03-21 DIAGNOSIS — K5900 Constipation, unspecified: Secondary | ICD-10-CM | POA: Diagnosis not present

## 2020-03-21 DIAGNOSIS — Z951 Presence of aortocoronary bypass graft: Secondary | ICD-10-CM | POA: Diagnosis not present

## 2020-03-21 DIAGNOSIS — C679 Malignant neoplasm of bladder, unspecified: Secondary | ICD-10-CM | POA: Diagnosis not present

## 2020-03-21 DIAGNOSIS — Z86718 Personal history of other venous thrombosis and embolism: Secondary | ICD-10-CM | POA: Diagnosis not present

## 2020-03-21 DIAGNOSIS — I359 Nonrheumatic aortic valve disorder, unspecified: Secondary | ICD-10-CM | POA: Diagnosis not present

## 2020-03-21 DIAGNOSIS — Z87440 Personal history of urinary (tract) infections: Secondary | ICD-10-CM | POA: Diagnosis not present

## 2020-03-21 DIAGNOSIS — E785 Hyperlipidemia, unspecified: Secondary | ICD-10-CM | POA: Diagnosis not present

## 2020-03-21 DIAGNOSIS — Z7902 Long term (current) use of antithrombotics/antiplatelets: Secondary | ICD-10-CM | POA: Diagnosis not present

## 2020-03-21 DIAGNOSIS — D630 Anemia in neoplastic disease: Secondary | ICD-10-CM | POA: Diagnosis not present

## 2020-03-21 DIAGNOSIS — I10 Essential (primary) hypertension: Secondary | ICD-10-CM | POA: Diagnosis not present

## 2020-03-21 DIAGNOSIS — E119 Type 2 diabetes mellitus without complications: Secondary | ICD-10-CM | POA: Diagnosis not present

## 2020-03-21 DIAGNOSIS — L989 Disorder of the skin and subcutaneous tissue, unspecified: Secondary | ICD-10-CM | POA: Diagnosis not present

## 2020-03-21 DIAGNOSIS — Z7982 Long term (current) use of aspirin: Secondary | ICD-10-CM | POA: Diagnosis not present

## 2020-03-21 DIAGNOSIS — Z5111 Encounter for antineoplastic chemotherapy: Secondary | ICD-10-CM | POA: Diagnosis not present

## 2020-03-21 DIAGNOSIS — Z7984 Long term (current) use of oral hypoglycemic drugs: Secondary | ICD-10-CM | POA: Diagnosis not present

## 2020-03-21 DIAGNOSIS — C678 Malignant neoplasm of overlapping sites of bladder: Secondary | ICD-10-CM | POA: Diagnosis not present

## 2020-03-21 DIAGNOSIS — G4700 Insomnia, unspecified: Secondary | ICD-10-CM | POA: Diagnosis not present

## 2020-03-21 DIAGNOSIS — Z87891 Personal history of nicotine dependence: Secondary | ICD-10-CM | POA: Diagnosis not present

## 2020-03-21 DIAGNOSIS — I69320 Aphasia following cerebral infarction: Secondary | ICD-10-CM | POA: Diagnosis not present

## 2020-03-21 DIAGNOSIS — I69351 Hemiplegia and hemiparesis following cerebral infarction affecting right dominant side: Secondary | ICD-10-CM | POA: Diagnosis not present

## 2020-03-22 ENCOUNTER — Ambulatory Visit
Admission: RE | Admit: 2020-03-22 | Discharge: 2020-03-22 | Disposition: A | Discharge status: Home / Self Care | Payer: Medicare Other | Source: Ambulatory Visit | Attending: Interventional Radiology | Admitting: Interventional Radiology

## 2020-03-22 DIAGNOSIS — Z95828 Presence of other vascular implants and grafts: Secondary | ICD-10-CM

## 2020-03-22 DIAGNOSIS — Z86718 Personal history of other venous thrombosis and embolism: Secondary | ICD-10-CM | POA: Diagnosis not present

## 2020-03-22 HISTORY — PX: IR RADIOLOGIST EVAL & MGMT: IMG5224

## 2020-03-22 IMAGING — US US EXTREM LOW VENOUS
1 series · 13 of 24 positions shown · non-contrast
Comparison: None.

CLINICAL DATA: 69-year-old female with a history of prior DVT,
[DATE]



[Series 1: us extrem low venous · 0.07mm/px · 13 of 69 slices shown]
[im 1/69]
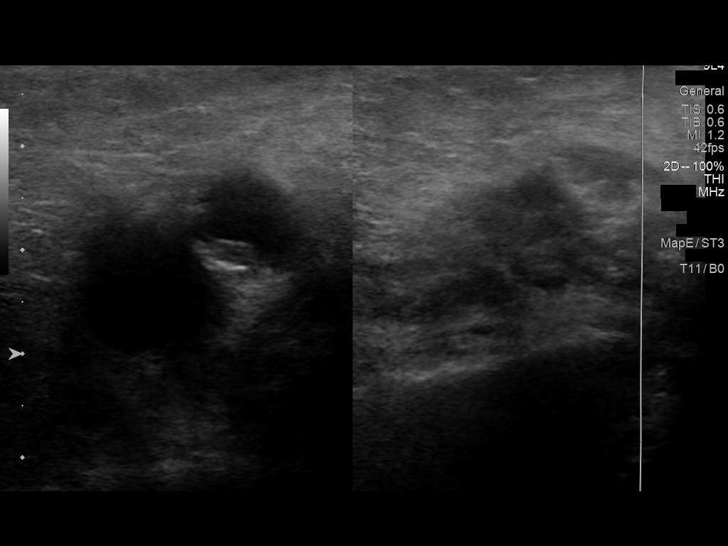
[im 6/69]
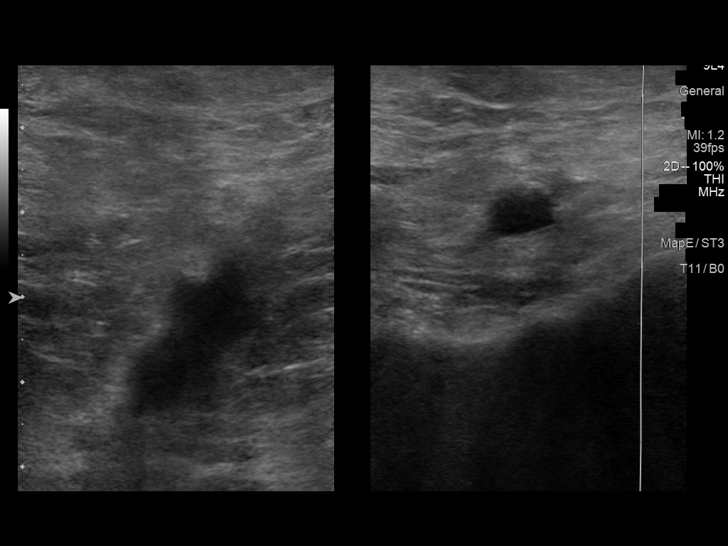
[im 12/69]
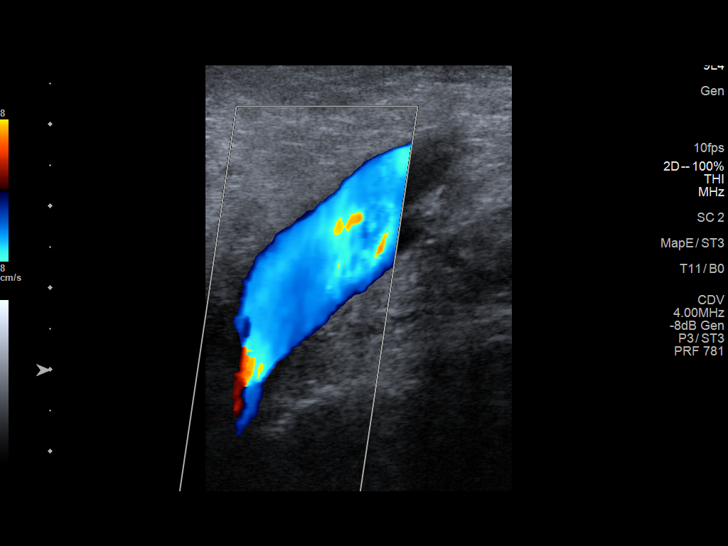
[im 18/69]
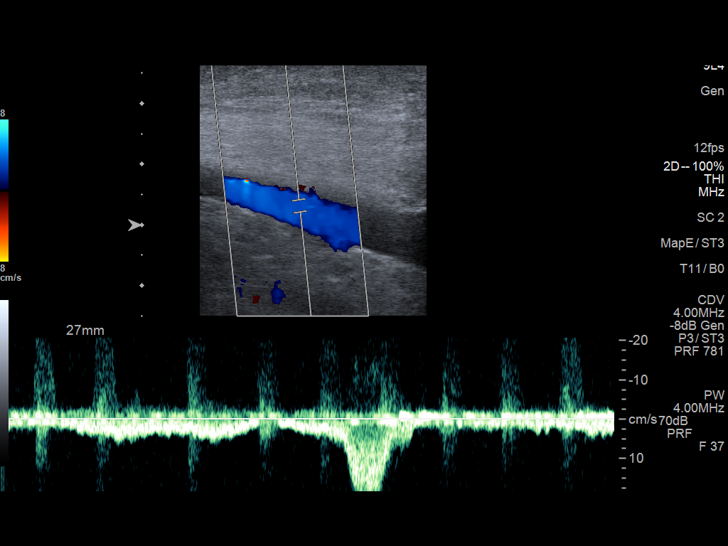
[im 24/69]
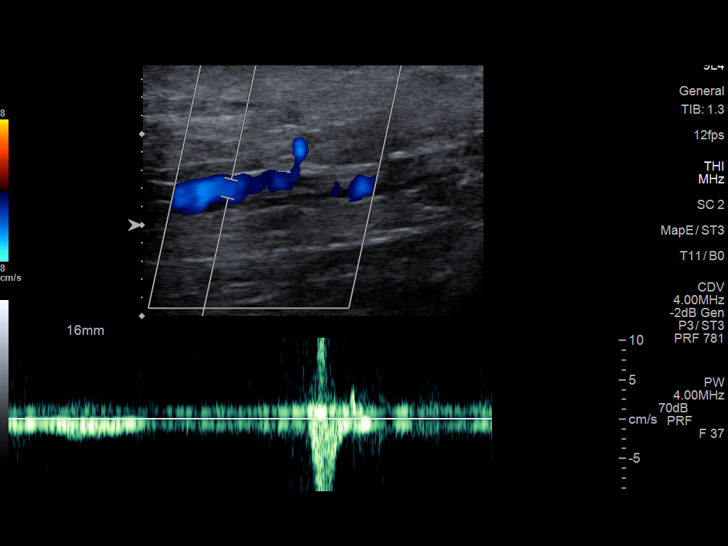
[im 30/69]
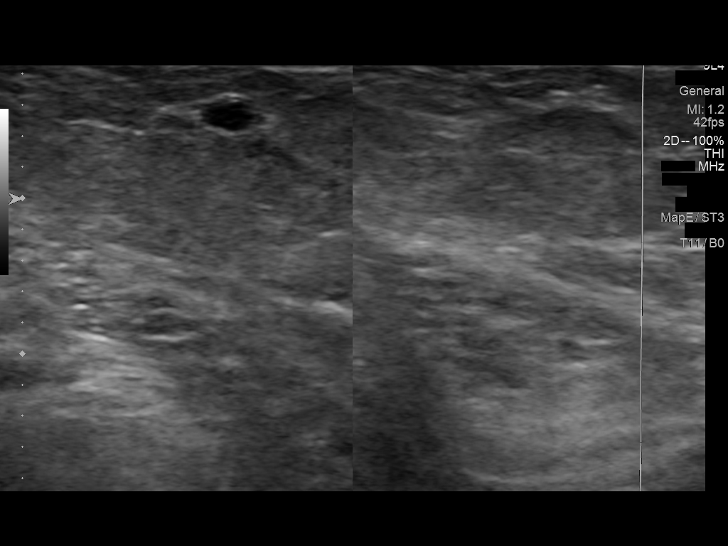
[im 36/69]
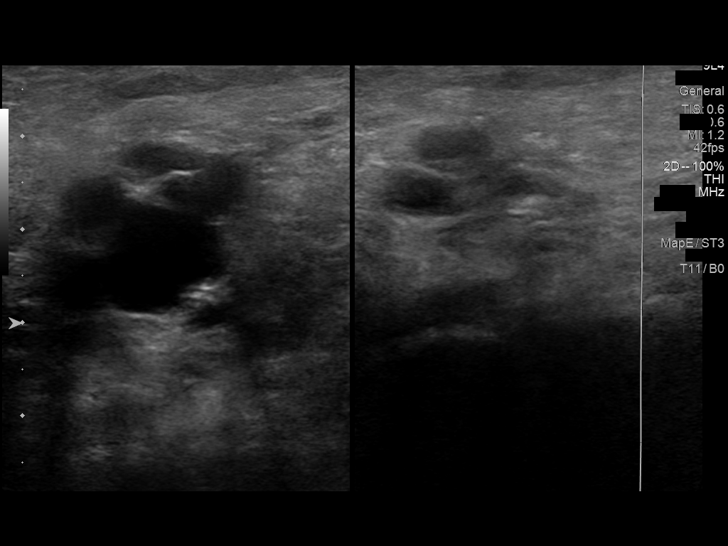
[im 39/69]
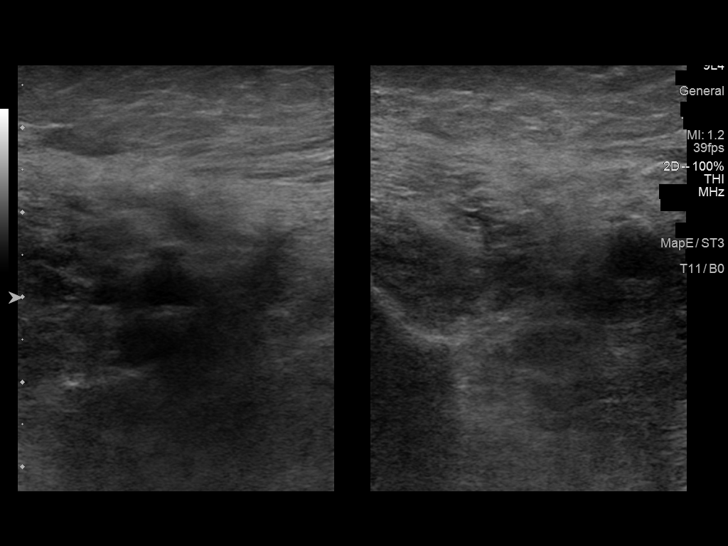
[im 45/69]
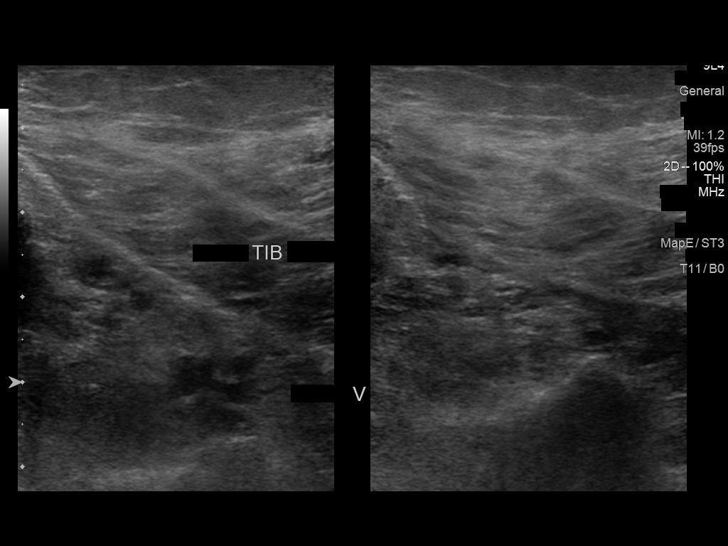
[im 51/69]
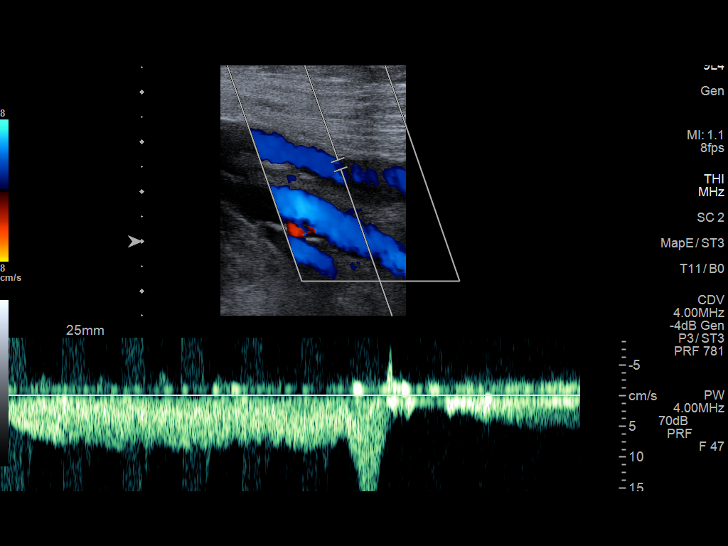
[im 57/69]
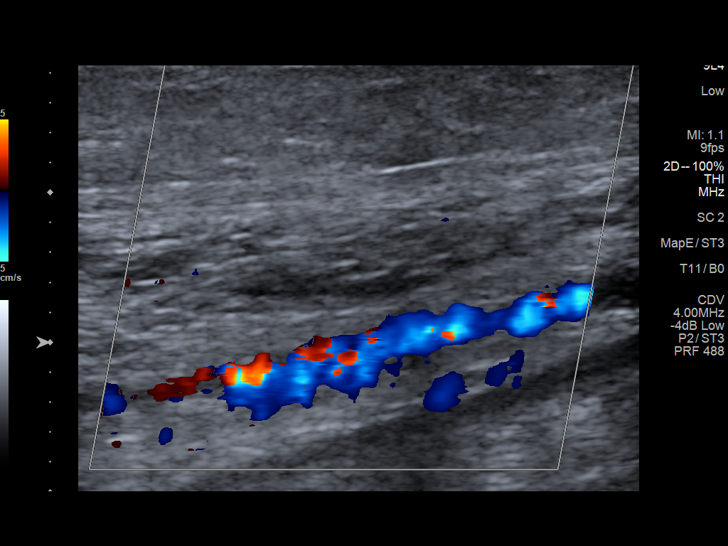
[im 63/69]
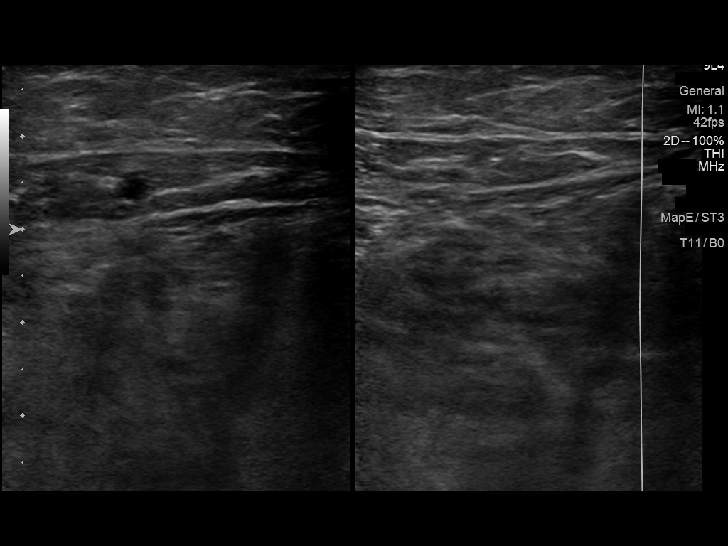
[im 69/69]
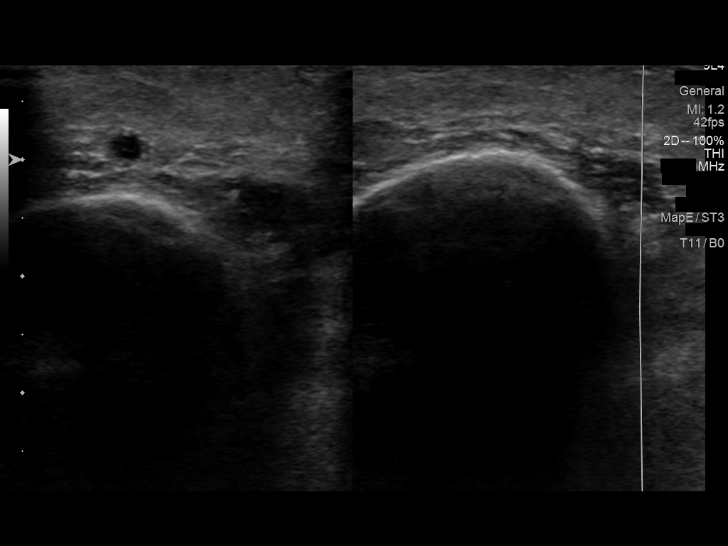

[13 of 24 positions shown; findings below may reference images not displayed]

FINDINGS: RIGHT LOWER EXTREMITY

Common Femoral Vein: No evidence of thrombus. Normal
compressibility, respiratory phasicity and response to augmentation.

Saphenofemoral Junction: No evidence of thrombus. Normal
compressibility and flow on color Doppler imaging.

Profunda Femoral Vein: No evidence of thrombus. Normal
compressibility and flow on color Doppler imaging.

Femoral Vein: No evidence of thrombus. Normal compressibility,
respiratory phasicity and response to augmentation.

Popliteal Vein: No evidence of thrombus. Normal compressibility,
respiratory phasicity and response to augmentation.

Calf Veins: No evidence of thrombus. Normal compressibility and flow
on color Doppler imaging.

Superficial Great Saphenous Vein: No evidence of thrombus. Normal
compressibility and flow on color Doppler imaging.

Other Findings:  None.

LEFT LOWER EXTREMITY

Common Femoral Vein: No evidence of thrombus. Normal
compressibility, respiratory phasicity and response to augmentation.

Saphenofemoral Junction: No evidence of thrombus. Normal
compressibility and flow on color Doppler imaging.

Profunda Femoral Vein: No evidence of thrombus. Normal
compressibility and flow on color Doppler imaging.

Femoral Vein: No evidence of thrombus. Normal compressibility,
respiratory phasicity and response to augmentation.

Popliteal Vein: No evidence of thrombus. Normal compressibility,
respiratory phasicity and response to augmentation.

Calf Veins: No evidence of thrombus. Normal compressibility and flow
on color Doppler imaging.

Superficial Great Saphenous Vein: No evidence of thrombus. Normal
compressibility and flow on color Doppler imaging.

Other Findings:  None.
IMPRESSION: Sonographic survey of the bilateral lower extremities negative for
DVT

## 2020-03-22 NOTE — Consult Note (Signed)
Chief Complaint: Presence of IVC filter  Referring Physician(s): Lisa [name]  History of Present Illness: Lisa [name] is a 70 y.o. female presenting today as a scheduled follow up to [place] [facility], with an IVC filter in place.   Lisa [name] is here today with her family, Lisa [name], who is also her primary care-giver.    Lisa [name] was hospitalized last June of 2021 with acute stroke, ultimately being treated with intensive medical therapy and carotid stent for symptomatic ICA stenosis.  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  She was hospitalized for a short course at this time, then [facility] 09/07/19.  She returned to the ED with more hematuria, abdominal pain, 09/17/19. She was hospitalized again, and during this stay, duplex of the bilateral lower extremity was performed.  This result: Right: DVT of FV, popliteal vein, PT and peroneal vv.  Left: DVT of popliteal vein, PT vein, peroneal vv.   Anticoagulation was started, but she had further hospitalization for hematuria and acute blood loss anemia, hospitalized through the ED 10/05/19.  AC was withdrawn, and retrievable IVC filter was placed 10/07/19.   At this time, she tells me that she feels very well, and continues to receive BCG treatment with Urology team.  She has not had recurrent hematuria recently. She is active every day, and is able to performed almost all of her ADL's.  She gets some assistance at home with nursing assistance.    She tells me that she remains active, and goes outside as much as she can.  She denies any lower extremity symptoms of post-thrombotic syndrome.    Repeat duplex performed today reveals no DVT, with resolution of the prior DVT.   Past Medical History:  Diagnosis Date  . Anemia   . Cancer ([place])   . Carotid arterial disease ([place]) 07/2019  . Diabetes ([place])   . HTN  (hypertension)   . Hyperlipidemia   . Stroke [facility])    acute/subacute left MCA infarct 08/12/19    Past Surgical History:  Procedure Laterality Date  . CYSTOSCOPY WITH FULGERATION N/A 09/01/2019   Procedure: CYSTOSCOPY CLOT EVACUATION WITH POSSIBLE FULGERATION;  Surgeon: Robley Fries, MD;  Location: [facility];  Service: Urology;  Laterality: N/A;  . CYSTOSCOPY WITH FULGERATION N/A 09/28/2019   Procedure: [place] AND CLOT EVACUATION;  Surgeon: Robley Fries, MD;  Location: [facility];  Service: Urology;  Laterality: N/A;  . IR IVC FILTER PLMT / S&I Burke Keels GUID/MOD SED  10/07/2019  . IR RADIOLOGIST EVAL & MGMT  03/22/2020  . TONSILLECTOMY    . TRANSCAROTID ARTERY REVASCULARIZATION Left 08/20/2019   Procedure: LEFT TRANSCAROTID ARTERY REVASCULARIZATION;  Surgeon: Waynetta Sandy, MD;  Location: [facility];  Service: Vascular;  Laterality: Left;  . TRANSURETHRAL RESECTION OF BLADDER TUMOR WITH MITOMYCIN-C N/A 09/22/2019   Procedure: TRANSURETHRAL RESECTION OF BLADDER TUMOR WITH INTRAVESICAL GEMCITABINE;  Surgeon: Robley Fries, MD;  Location: [facility];  Service: Urology;  Laterality: N/A;  90 MINS    Allergies: Patient has no known allergies.  Medications: Prior to Admission medications   Medication Sig Start Date End Date Taking? Authorizing Provider  amLODipine (NORVASC) 10 MG tablet Take 1 tablet (10 mg total) by mouth daily. 03/01/20   Brunetta Jeans, PA-C  aspirin 81 MG EC tablet Take 1 tablet (81 mg total) by mouth daily. Swallow whole. 03/01/20  Brunetta Jeans, PA-C  atorvastatin (LIPITOR) 20 MG tablet Take 1 tablet (20 mg total) by mouth daily. 03/01/20   Brunetta Jeans, PA-C  clopidogrel (PLAVIX) 75 MG tablet Take 1 tablet (75 mg total) by mouth daily. 03/01/20   Brunetta Jeans, PA-C  docusate sodium (COLACE) 100 MG capsule Take 1 capsule (100 mg total) by mouth daily. 03/01/20   Brunetta Jeans, PA-C  losartan (COZAAR) 25 MG tablet Take 1 tablet (25 mg  total) by mouth in the morning. 03/01/20   Brunetta Jeans, PA-C  Melatonin 10 MG TABS Take 10 mg by mouth at bedtime. 03/01/20   Brunetta Jeans, PA-C  metFORMIN (GLUCOPHAGE XR) 500 MG 24 hr tablet Take 1 tablet (500 mg total) by mouth daily with breakfast. 03/01/20   Brunetta Jeans, PA-C  Multiple Vitamin tablet Take 1 tablet by mouth daily.     [provider]  Multiple Vitamins-Minerals (HIGH POT MULTIVITAMIN/BETA-CAR) TABS Take 1 tablet by mouth daily. 01/07/20   [provider]  polyethylene glycol (MIRALAX / GLYCOLAX) 17 g packet Take 17 g by mouth daily as needed for mild constipation or moderate constipation. 03/01/20   Brunetta Jeans, PA-C  terbinafine (LAMISIL) 250 MG tablet Take 250 mg by mouth daily. 12/30/19   [provider]  traZODone (DESYREL) 50 MG tablet Take 1 tablet (50 mg total) by mouth at bedtime. 03/01/20   Brunetta Jeans, PA-C     Family History  Problem Relation Age of Onset  . Hypertension Mother   . Lung cancer Father     Social History   Socioeconomic History  . Marital status: Married    Spouse name: Not on file  . Number of children: Not on file  . Years of education: Not on file  . Highest education level: Not on file  Occupational History  . Not on file  Tobacco Use  . Smoking status: Former Smoker    Packs/day: 0.50    Types: Cigarettes    Quit date: 07/28/2019    Years since quitting: 0.6  . Smokeless tobacco: Never Used  Vaping Use  . Vaping Use: Never used  Substance and Sexual Activity  . Alcohol use: Not Currently  . Drug use: Never  . Sexual activity: Yes  Other Topics Concern  . Not on file  Social History Narrative   Currently living at [facility] in [place], [place]   Social Determinants of Health   Financial Resource Strain: Not on file  Food Insecurity: Not on file  Transportation Needs: Not on file  Physical Activity: Not on file  Stress: Not on file  Social Connections:  Not on file       Review of Systems: A 12 point ROS discussed and pertinent positives are indicated in the HPI above.  All other systems are negative.  Review of Systems  Vital Signs: There were no vitals taken for this visit.  Physical Exam General: 71 yo female appearing stated age.  Well-developed, well-nourished.  No distress. HEENT: Glasses. Atraumatic, normocephalic.  Conjugate gaze, extra-ocular motor intact. No scleral icterus or scleral injection.  Small excoriation on her lip, not bleeding. Oral mucosa moist, pink.  Neck: Symmetric with no goiter enlargement.  Chest/Lungs:  Symmetric chest with inspiration/expiration.  No labored breathing.    Heart:    No JVD appreciated.  Abdomen: Non-obese. Genito-urinary: Deferred Neurologic: Alert & Oriented to person, place, and time.   Normal affect and insight.  Appropriate  questions.  Moving all 4 extremities with gross sensory intact.         Imaging: US Venous Img Lower Bilateral (DVT)  Result Date: 03/22/2020 CLINICAL DATA:  70 year old female with a history of prior DVT, August of 2021 EXAM: BILATERAL LOWER EXTREMITY VENOUS DOPPLER ULTRASOUND TECHNIQUE: Gray-scale sonography with graded compression, as well as color Doppler and duplex ultrasound were performed to evaluate the lower extremity deep venous systems from the level of the common femoral vein and including the common femoral, femoral, profunda femoral, popliteal and calf veins including the posterior tibial, peroneal and gastrocnemius veins when visible. The superficial great saphenous vein was also interrogated. Spectral Doppler was utilized to evaluate flow at rest and with distal augmentation maneuvers in the common femoral, femoral and popliteal veins. COMPARISON:  None. FINDINGS: RIGHT LOWER EXTREMITY Common Femoral Vein: No evidence of thrombus. Normal compressibility, respiratory phasicity and response to augmentation. Saphenofemoral Junction: No evidence of thrombus.  Normal compressibility and flow on color Doppler imaging. Profunda Femoral Vein: No evidence of thrombus. Normal compressibility and flow on color Doppler imaging. Femoral Vein: No evidence of thrombus. Normal compressibility, respiratory phasicity and response to augmentation. Popliteal Vein: No evidence of thrombus. Normal compressibility, respiratory phasicity and response to augmentation. Calf Veins: No evidence of thrombus. Normal compressibility and flow on color Doppler imaging. Superficial Great Saphenous Vein: No evidence of thrombus. Normal compressibility and flow on color Doppler imaging. Other Findings:  None. LEFT LOWER EXTREMITY Common Femoral Vein: No evidence of thrombus. Normal compressibility, respiratory phasicity and response to augmentation. Saphenofemoral Junction: No evidence of thrombus. Normal compressibility and flow on color Doppler imaging. Profunda Femoral Vein: No evidence of thrombus. Normal compressibility and flow on color Doppler imaging. Femoral Vein: No evidence of thrombus. Normal compressibility, respiratory phasicity and response to augmentation. Popliteal Vein: No evidence of thrombus. Normal compressibility, respiratory phasicity and response to augmentation. Calf Veins: No evidence of thrombus. Normal compressibility and flow on color Doppler imaging. Superficial Great Saphenous Vein: No evidence of thrombus. Normal compressibility and flow on color Doppler imaging. Other Findings:  None. IMPRESSION: Sonographic survey of the bilateral lower extremities negative for DVT Electronically Signed   By: Corrie Mckusick D.O.   On: 03/22/2020 12:45   IR Radiologist Eval & Mgmt  Result Date: 03/22/2020 CLINICAL DATA:  70 year old female with a history of prior DVT, August of 2021 EXAM: BILATERAL LOWER EXTREMITY VENOUS DOPPLER ULTRASOUND TECHNIQUE: Gray-scale sonography with graded compression, as well as color Doppler and duplex ultrasound were performed to evaluate the lower  extremity deep venous systems from the level of the common femoral vein and including the common femoral, femoral, profunda femoral, popliteal and calf veins including the posterior tibial, peroneal and gastrocnemius veins when visible. The superficial great saphenous vein was also interrogated. Spectral Doppler was utilized to evaluate flow at rest and with distal augmentation maneuvers in the common femoral, femoral and popliteal veins. COMPARISON:  None. FINDINGS: RIGHT LOWER EXTREMITY Common Femoral Vein: No evidence of thrombus. Normal compressibility, respiratory phasicity and response to augmentation. Saphenofemoral Junction: No evidence of thrombus. Normal compressibility and flow on color Doppler imaging. Profunda Femoral Vein: No evidence of thrombus. Normal compressibility and flow on color Doppler imaging. Femoral Vein: No evidence of thrombus. Normal compressibility, respiratory phasicity and response to augmentation. Popliteal Vein: No evidence of thrombus. Normal compressibility, respiratory phasicity and response to augmentation. Calf Veins: No evidence of thrombus. Normal compressibility and flow on color Doppler imaging. Superficial Great Saphenous Vein: No evidence of thrombus.  Normal compressibility and flow on color Doppler imaging. Other Findings:  None. LEFT LOWER EXTREMITY Common Femoral Vein: No evidence of thrombus. Normal compressibility, respiratory phasicity and response to augmentation. Saphenofemoral Junction: No evidence of thrombus. Normal compressibility and flow on color Doppler imaging. Profunda Femoral Vein: No evidence of thrombus. Normal compressibility and flow on color Doppler imaging. Femoral Vein: No evidence of thrombus. Normal compressibility, respiratory phasicity and response to augmentation. Popliteal Vein: No evidence of thrombus. Normal compressibility, respiratory phasicity and response to augmentation. Calf Veins: No evidence of thrombus. Normal compressibility and  flow on color Doppler imaging. Superficial Great Saphenous Vein: No evidence of thrombus. Normal compressibility and flow on color Doppler imaging. Other Findings:  None. IMPRESSION: Sonographic survey of the bilateral lower extremities negative for DVT Electronically Signed   By: Corrie Mckusick D.O.   On: 03/22/2020 12:45    Labs:  CBC: Recent Labs    10/11/19 0858 10/13/19 0531 10/14/19 0039 10/18/19 0258  WBC 9.2 8.0 10.0 12.4*  HGB 10.9* 10.6* 10.2* 10.1*  HCT 34.6* 33.8* 33.2* 32.4*  PLT 542* 475* 453* 352    COAGS: Recent Labs    08/19/19 1522 08/31/19 0855 10/05/19 1641 10/06/19 0048 10/06/19 0740 10/06/19 0914 10/06/19 2118 10/07/19 0421  INR 1.0 1.1 1.9*  --  1.8*  --   --   --   APTT 33  --   --    < > >200* 198* 104* 136*   < > = values in this interval not displayed.    BMP: Recent Labs    10/09/19 0810 10/10/19 0417 10/11/19 0858 10/18/19 0215 01/06/20 1119  NA 139 137 139 134* 139  K 4.0 3.4* 3.7 3.7 3.9  CL 109 104 102 98 102  CO2 22 23 26 25 27   GLUCOSE 98 91 120* 120* 139*  BUN <5* <5* 7* 10 11  CALCIUM 8.4* 8.7* 9.0 9.1 9.6  CREATININE 0.37* 0.48 0.52 0.49 0.57  GFRNONAA >60 >60 >60 >60  --   GFRAA >60 >60 >60 >60  --     LIVER FUNCTION TESTS: Recent Labs    09/21/19 0128 09/22/19 0323 10/05/19 1641 01/06/20 1119  BILITOT 1.0 0.8 0.6 0.4  AST 16 17 21 18   ALT 14 13 16 12   ALKPHOS 87 79 90 83  PROT 5.1* 5.2* 5.8* 6.3  ALBUMIN 2.3* 2.3* 2.5* 3.5    TUMOR MARKERS: No results for input(s): AFPTM, CEA, CA199, CHROMGRNA in the last 8760 hours.  Assessment and Plan:  Lisa [name] is a 70 yo female with prior history of provoked DVT during extended period of serial hospitalization/bed-bound during last summer for stroke, hemorrhagic urothelial cell CA, and anemia.   At this time she has IVC filter in place, and is 6 months from the provoked DVT diagnosis.   I reviewed with her the typical 6 month time frame of treatment from the  standpoint of provoked DVT, which has expired for her.  We also discussed the importance of taking out the filter as soon as possible.  Given that she has no history of prior DVT, and currently has no risk factors such as bed-bound status, hospitalization planned, no planned upcoming surgery, it is reasonable to take out the filter at this time.    Risks and benefits of filter removal were discussed with the patient including, but not limited to bleeding, infection, contrast induced renal failure, filter fracture or migration which can lead to emergency surgery or even death, strut penetration  with damage or irritation to adjacent structures and caval thrombosis.  All of the patient's questions were answered, patient is agreeable to proceed. Consent signed and in chart.  Plan: - Plan for image guided IVC filter removal with Dr Earleen Newport, within the next few weeks, at her convenience.  - continue current care     Electronically Signed: Corrie Mckusick 03/22/2020, 12:49 PM   I spent a total of  40 Minutes   in face to face in clinical consultation, greater than 50% of which was counseling/coordinating care for presence of IVC filter for prior provoked DVT, possible retrieval of IVC filter.

## 2020-03-23 ENCOUNTER — Telehealth: Payer: Self-pay | Admitting: Physician Assistant

## 2020-03-23 DIAGNOSIS — Z7982 Long term (current) use of aspirin: Secondary | ICD-10-CM | POA: Diagnosis not present

## 2020-03-23 DIAGNOSIS — Z7902 Long term (current) use of antithrombotics/antiplatelets: Secondary | ICD-10-CM | POA: Diagnosis not present

## 2020-03-23 DIAGNOSIS — D630 Anemia in neoplastic disease: Secondary | ICD-10-CM | POA: Diagnosis not present

## 2020-03-23 DIAGNOSIS — E785 Hyperlipidemia, unspecified: Secondary | ICD-10-CM | POA: Diagnosis not present

## 2020-03-23 DIAGNOSIS — Z87440 Personal history of urinary (tract) infections: Secondary | ICD-10-CM | POA: Diagnosis not present

## 2020-03-23 DIAGNOSIS — Z7984 Long term (current) use of oral hypoglycemic drugs: Secondary | ICD-10-CM | POA: Diagnosis not present

## 2020-03-23 DIAGNOSIS — C679 Malignant neoplasm of bladder, unspecified: Secondary | ICD-10-CM | POA: Diagnosis not present

## 2020-03-23 DIAGNOSIS — I69320 Aphasia following cerebral infarction: Secondary | ICD-10-CM | POA: Diagnosis not present

## 2020-03-23 DIAGNOSIS — Z951 Presence of aortocoronary bypass graft: Secondary | ICD-10-CM | POA: Diagnosis not present

## 2020-03-23 DIAGNOSIS — Z86718 Personal history of other venous thrombosis and embolism: Secondary | ICD-10-CM | POA: Diagnosis not present

## 2020-03-23 DIAGNOSIS — I69351 Hemiplegia and hemiparesis following cerebral infarction affecting right dominant side: Secondary | ICD-10-CM | POA: Diagnosis not present

## 2020-03-23 DIAGNOSIS — I10 Essential (primary) hypertension: Secondary | ICD-10-CM | POA: Diagnosis not present

## 2020-03-23 DIAGNOSIS — E119 Type 2 diabetes mellitus without complications: Secondary | ICD-10-CM | POA: Diagnosis not present

## 2020-03-23 DIAGNOSIS — L989 Disorder of the skin and subcutaneous tissue, unspecified: Secondary | ICD-10-CM | POA: Diagnosis not present

## 2020-03-23 DIAGNOSIS — Z87891 Personal history of nicotine dependence: Secondary | ICD-10-CM | POA: Diagnosis not present

## 2020-03-23 DIAGNOSIS — K5900 Constipation, unspecified: Secondary | ICD-10-CM | POA: Diagnosis not present

## 2020-03-23 DIAGNOSIS — G4700 Insomnia, unspecified: Secondary | ICD-10-CM | POA: Diagnosis not present

## 2020-03-23 DIAGNOSIS — I359 Nonrheumatic aortic valve disorder, unspecified: Secondary | ICD-10-CM | POA: Diagnosis not present

## 2020-03-23 NOTE — Telephone Encounter (Signed)
Unsure if forms in your folder for review and signature

## 2020-03-23 NOTE — Telephone Encounter (Signed)
..  Home Health Certification or Plan of Care Tracking  Is this a Certification or Plan of Care? yes [place] Agency:[facility] Order Number:  [id number]  Has charge sheet been attached? yes Where has form been placed:  In Lisa [name]'s bin up front

## 2020-03-25 NOTE — Telephone Encounter (Signed)
Form completed in bin for fax

## 2020-03-25 NOTE — Telephone Encounter (Signed)
Form completed and placed in basket  

## 2020-03-28 ENCOUNTER — Other Ambulatory Visit: Payer: Self-pay | Admitting: Physician Assistant

## 2020-03-28 DIAGNOSIS — I10 Essential (primary) hypertension: Secondary | ICD-10-CM | POA: Diagnosis not present

## 2020-03-28 DIAGNOSIS — L989 Disorder of the skin and subcutaneous tissue, unspecified: Secondary | ICD-10-CM | POA: Diagnosis not present

## 2020-03-28 DIAGNOSIS — I359 Nonrheumatic aortic valve disorder, unspecified: Secondary | ICD-10-CM | POA: Diagnosis not present

## 2020-03-28 DIAGNOSIS — Z7982 Long term (current) use of aspirin: Secondary | ICD-10-CM | POA: Diagnosis not present

## 2020-03-28 DIAGNOSIS — Z7984 Long term (current) use of oral hypoglycemic drugs: Secondary | ICD-10-CM | POA: Diagnosis not present

## 2020-03-28 DIAGNOSIS — Z7902 Long term (current) use of antithrombotics/antiplatelets: Secondary | ICD-10-CM | POA: Diagnosis not present

## 2020-03-28 DIAGNOSIS — Z87891 Personal history of nicotine dependence: Secondary | ICD-10-CM | POA: Diagnosis not present

## 2020-03-28 DIAGNOSIS — I69351 Hemiplegia and hemiparesis following cerebral infarction affecting right dominant side: Secondary | ICD-10-CM | POA: Diagnosis not present

## 2020-03-28 DIAGNOSIS — Z87440 Personal history of urinary (tract) infections: Secondary | ICD-10-CM | POA: Diagnosis not present

## 2020-03-28 DIAGNOSIS — Z951 Presence of aortocoronary bypass graft: Secondary | ICD-10-CM | POA: Diagnosis not present

## 2020-03-28 DIAGNOSIS — D630 Anemia in neoplastic disease: Secondary | ICD-10-CM | POA: Diagnosis not present

## 2020-03-28 DIAGNOSIS — K5900 Constipation, unspecified: Secondary | ICD-10-CM | POA: Diagnosis not present

## 2020-03-28 DIAGNOSIS — Z86718 Personal history of other venous thrombosis and embolism: Secondary | ICD-10-CM | POA: Diagnosis not present

## 2020-03-28 DIAGNOSIS — G4700 Insomnia, unspecified: Secondary | ICD-10-CM | POA: Diagnosis not present

## 2020-03-28 DIAGNOSIS — I69320 Aphasia following cerebral infarction: Secondary | ICD-10-CM | POA: Diagnosis not present

## 2020-03-28 DIAGNOSIS — E119 Type 2 diabetes mellitus without complications: Secondary | ICD-10-CM | POA: Diagnosis not present

## 2020-03-28 DIAGNOSIS — C679 Malignant neoplasm of bladder, unspecified: Secondary | ICD-10-CM | POA: Diagnosis not present

## 2020-03-28 DIAGNOSIS — E785 Hyperlipidemia, unspecified: Secondary | ICD-10-CM | POA: Diagnosis not present

## 2020-03-28 NOTE — Telephone Encounter (Signed)
Picked up from the back, faxed to the # provided on the form and sent to scan  

## 2020-03-29 ENCOUNTER — Ambulatory Visit: Payer: Medicare Other | Admitting: Cardiology

## 2020-03-30 DIAGNOSIS — C679 Malignant neoplasm of bladder, unspecified: Secondary | ICD-10-CM | POA: Diagnosis not present

## 2020-03-30 DIAGNOSIS — E119 Type 2 diabetes mellitus without complications: Secondary | ICD-10-CM | POA: Diagnosis not present

## 2020-03-30 DIAGNOSIS — E785 Hyperlipidemia, unspecified: Secondary | ICD-10-CM | POA: Diagnosis not present

## 2020-03-30 DIAGNOSIS — L989 Disorder of the skin and subcutaneous tissue, unspecified: Secondary | ICD-10-CM | POA: Diagnosis not present

## 2020-03-30 DIAGNOSIS — G4700 Insomnia, unspecified: Secondary | ICD-10-CM | POA: Diagnosis not present

## 2020-03-30 DIAGNOSIS — Z7982 Long term (current) use of aspirin: Secondary | ICD-10-CM | POA: Diagnosis not present

## 2020-03-30 DIAGNOSIS — I10 Essential (primary) hypertension: Secondary | ICD-10-CM | POA: Diagnosis not present

## 2020-03-30 DIAGNOSIS — I69320 Aphasia following cerebral infarction: Secondary | ICD-10-CM | POA: Diagnosis not present

## 2020-03-30 DIAGNOSIS — Z86718 Personal history of other venous thrombosis and embolism: Secondary | ICD-10-CM | POA: Diagnosis not present

## 2020-03-30 DIAGNOSIS — Z7902 Long term (current) use of antithrombotics/antiplatelets: Secondary | ICD-10-CM | POA: Diagnosis not present

## 2020-03-30 DIAGNOSIS — I359 Nonrheumatic aortic valve disorder, unspecified: Secondary | ICD-10-CM | POA: Diagnosis not present

## 2020-03-30 DIAGNOSIS — Z951 Presence of aortocoronary bypass graft: Secondary | ICD-10-CM | POA: Diagnosis not present

## 2020-03-30 DIAGNOSIS — I69351 Hemiplegia and hemiparesis following cerebral infarction affecting right dominant side: Secondary | ICD-10-CM | POA: Diagnosis not present

## 2020-03-30 DIAGNOSIS — D630 Anemia in neoplastic disease: Secondary | ICD-10-CM | POA: Diagnosis not present

## 2020-03-30 DIAGNOSIS — K5900 Constipation, unspecified: Secondary | ICD-10-CM | POA: Diagnosis not present

## 2020-03-30 DIAGNOSIS — Z87440 Personal history of urinary (tract) infections: Secondary | ICD-10-CM | POA: Diagnosis not present

## 2020-03-30 DIAGNOSIS — Z87891 Personal history of nicotine dependence: Secondary | ICD-10-CM | POA: Diagnosis not present

## 2020-03-30 DIAGNOSIS — Z7984 Long term (current) use of oral hypoglycemic drugs: Secondary | ICD-10-CM | POA: Diagnosis not present

## 2020-04-01 ENCOUNTER — Ambulatory Visit: Payer: Medicare Other | Admitting: Cardiology

## 2020-04-01 DIAGNOSIS — I359 Nonrheumatic aortic valve disorder, unspecified: Secondary | ICD-10-CM | POA: Diagnosis not present

## 2020-04-01 DIAGNOSIS — Z87440 Personal history of urinary (tract) infections: Secondary | ICD-10-CM | POA: Diagnosis not present

## 2020-04-01 DIAGNOSIS — K5900 Constipation, unspecified: Secondary | ICD-10-CM | POA: Diagnosis not present

## 2020-04-01 DIAGNOSIS — G4700 Insomnia, unspecified: Secondary | ICD-10-CM | POA: Diagnosis not present

## 2020-04-01 DIAGNOSIS — Z5111 Encounter for antineoplastic chemotherapy: Secondary | ICD-10-CM | POA: Diagnosis not present

## 2020-04-01 DIAGNOSIS — I69320 Aphasia following cerebral infarction: Secondary | ICD-10-CM | POA: Diagnosis not present

## 2020-04-01 DIAGNOSIS — Z951 Presence of aortocoronary bypass graft: Secondary | ICD-10-CM | POA: Diagnosis not present

## 2020-04-01 DIAGNOSIS — Z86718 Personal history of other venous thrombosis and embolism: Secondary | ICD-10-CM | POA: Diagnosis not present

## 2020-04-01 DIAGNOSIS — Z7982 Long term (current) use of aspirin: Secondary | ICD-10-CM | POA: Diagnosis not present

## 2020-04-01 DIAGNOSIS — I69351 Hemiplegia and hemiparesis following cerebral infarction affecting right dominant side: Secondary | ICD-10-CM | POA: Diagnosis not present

## 2020-04-01 DIAGNOSIS — Z7902 Long term (current) use of antithrombotics/antiplatelets: Secondary | ICD-10-CM | POA: Diagnosis not present

## 2020-04-01 DIAGNOSIS — Z7984 Long term (current) use of oral hypoglycemic drugs: Secondary | ICD-10-CM | POA: Diagnosis not present

## 2020-04-01 DIAGNOSIS — Z87891 Personal history of nicotine dependence: Secondary | ICD-10-CM | POA: Diagnosis not present

## 2020-04-01 DIAGNOSIS — D630 Anemia in neoplastic disease: Secondary | ICD-10-CM | POA: Diagnosis not present

## 2020-04-01 DIAGNOSIS — C679 Malignant neoplasm of bladder, unspecified: Secondary | ICD-10-CM | POA: Diagnosis not present

## 2020-04-01 DIAGNOSIS — I10 Essential (primary) hypertension: Secondary | ICD-10-CM | POA: Diagnosis not present

## 2020-04-01 DIAGNOSIS — E119 Type 2 diabetes mellitus without complications: Secondary | ICD-10-CM | POA: Diagnosis not present

## 2020-04-01 DIAGNOSIS — E785 Hyperlipidemia, unspecified: Secondary | ICD-10-CM | POA: Diagnosis not present

## 2020-04-01 DIAGNOSIS — L989 Disorder of the skin and subcutaneous tissue, unspecified: Secondary | ICD-10-CM | POA: Diagnosis not present

## 2020-04-01 DIAGNOSIS — C678 Malignant neoplasm of overlapping sites of bladder: Secondary | ICD-10-CM | POA: Diagnosis not present

## 2020-04-04 ENCOUNTER — Telehealth: Payer: Self-pay | Admitting: Physician Assistant

## 2020-04-04 ENCOUNTER — Other Ambulatory Visit (HOSPITAL_COMMUNITY): Payer: Self-pay | Admitting: Interventional Radiology

## 2020-04-04 ENCOUNTER — Telehealth (HOSPITAL_COMMUNITY): Payer: Self-pay

## 2020-04-04 DIAGNOSIS — Z87891 Personal history of nicotine dependence: Secondary | ICD-10-CM | POA: Diagnosis not present

## 2020-04-04 DIAGNOSIS — I10 Essential (primary) hypertension: Secondary | ICD-10-CM | POA: Diagnosis not present

## 2020-04-04 DIAGNOSIS — Z87440 Personal history of urinary (tract) infections: Secondary | ICD-10-CM | POA: Diagnosis not present

## 2020-04-04 DIAGNOSIS — I69320 Aphasia following cerebral infarction: Secondary | ICD-10-CM | POA: Diagnosis not present

## 2020-04-04 DIAGNOSIS — E785 Hyperlipidemia, unspecified: Secondary | ICD-10-CM | POA: Diagnosis not present

## 2020-04-04 DIAGNOSIS — Z7984 Long term (current) use of oral hypoglycemic drugs: Secondary | ICD-10-CM | POA: Diagnosis not present

## 2020-04-04 DIAGNOSIS — Z86718 Personal history of other venous thrombosis and embolism: Secondary | ICD-10-CM | POA: Diagnosis not present

## 2020-04-04 DIAGNOSIS — G4700 Insomnia, unspecified: Secondary | ICD-10-CM | POA: Diagnosis not present

## 2020-04-04 DIAGNOSIS — D630 Anemia in neoplastic disease: Secondary | ICD-10-CM | POA: Diagnosis not present

## 2020-04-04 DIAGNOSIS — E119 Type 2 diabetes mellitus without complications: Secondary | ICD-10-CM | POA: Diagnosis not present

## 2020-04-04 DIAGNOSIS — K5900 Constipation, unspecified: Secondary | ICD-10-CM | POA: Diagnosis not present

## 2020-04-04 DIAGNOSIS — Z95828 Presence of other vascular implants and grafts: Secondary | ICD-10-CM

## 2020-04-04 DIAGNOSIS — I359 Nonrheumatic aortic valve disorder, unspecified: Secondary | ICD-10-CM | POA: Diagnosis not present

## 2020-04-04 DIAGNOSIS — I69351 Hemiplegia and hemiparesis following cerebral infarction affecting right dominant side: Secondary | ICD-10-CM | POA: Diagnosis not present

## 2020-04-04 DIAGNOSIS — Z7902 Long term (current) use of antithrombotics/antiplatelets: Secondary | ICD-10-CM | POA: Diagnosis not present

## 2020-04-04 DIAGNOSIS — Z7982 Long term (current) use of aspirin: Secondary | ICD-10-CM | POA: Diagnosis not present

## 2020-04-04 DIAGNOSIS — C679 Malignant neoplasm of bladder, unspecified: Secondary | ICD-10-CM | POA: Diagnosis not present

## 2020-04-04 DIAGNOSIS — L989 Disorder of the skin and subcutaneous tissue, unspecified: Secondary | ICD-10-CM | POA: Diagnosis not present

## 2020-04-04 DIAGNOSIS — Z951 Presence of aortocoronary bypass graft: Secondary | ICD-10-CM | POA: Diagnosis not present

## 2020-04-04 NOTE — Telephone Encounter (Signed)
Caller needs a verbal authorization to continue speech therapy for twice a week for 4 weeks.

## 2020-04-04 NOTE — Telephone Encounter (Signed)
Called to schedule ivc filter retreival, no answer, left vm. AW

## 2020-04-05 NOTE — Telephone Encounter (Signed)
Left verbal orders VM of Lisa [name] Speech therapist for [facility]

## 2020-04-05 NOTE — Telephone Encounter (Signed)
Ok to continue speech therapy

## 2020-04-05 NOTE — Telephone Encounter (Signed)
Ok to continue

## 2020-04-06 ENCOUNTER — Telehealth (HOSPITAL_COMMUNITY): Payer: Self-pay

## 2020-04-06 DIAGNOSIS — K5900 Constipation, unspecified: Secondary | ICD-10-CM | POA: Diagnosis not present

## 2020-04-06 DIAGNOSIS — L989 Disorder of the skin and subcutaneous tissue, unspecified: Secondary | ICD-10-CM | POA: Diagnosis not present

## 2020-04-06 DIAGNOSIS — Z7902 Long term (current) use of antithrombotics/antiplatelets: Secondary | ICD-10-CM | POA: Diagnosis not present

## 2020-04-06 DIAGNOSIS — E785 Hyperlipidemia, unspecified: Secondary | ICD-10-CM | POA: Diagnosis not present

## 2020-04-06 DIAGNOSIS — I10 Essential (primary) hypertension: Secondary | ICD-10-CM | POA: Diagnosis not present

## 2020-04-06 DIAGNOSIS — G4700 Insomnia, unspecified: Secondary | ICD-10-CM | POA: Diagnosis not present

## 2020-04-06 DIAGNOSIS — Z87891 Personal history of nicotine dependence: Secondary | ICD-10-CM | POA: Diagnosis not present

## 2020-04-06 DIAGNOSIS — C679 Malignant neoplasm of bladder, unspecified: Secondary | ICD-10-CM | POA: Diagnosis not present

## 2020-04-06 DIAGNOSIS — I359 Nonrheumatic aortic valve disorder, unspecified: Secondary | ICD-10-CM | POA: Diagnosis not present

## 2020-04-06 DIAGNOSIS — I69320 Aphasia following cerebral infarction: Secondary | ICD-10-CM | POA: Diagnosis not present

## 2020-04-06 DIAGNOSIS — Z951 Presence of aortocoronary bypass graft: Secondary | ICD-10-CM | POA: Diagnosis not present

## 2020-04-06 DIAGNOSIS — E119 Type 2 diabetes mellitus without complications: Secondary | ICD-10-CM | POA: Diagnosis not present

## 2020-04-06 DIAGNOSIS — Z7982 Long term (current) use of aspirin: Secondary | ICD-10-CM | POA: Diagnosis not present

## 2020-04-06 DIAGNOSIS — I69351 Hemiplegia and hemiparesis following cerebral infarction affecting right dominant side: Secondary | ICD-10-CM | POA: Diagnosis not present

## 2020-04-06 DIAGNOSIS — Z86718 Personal history of other venous thrombosis and embolism: Secondary | ICD-10-CM | POA: Diagnosis not present

## 2020-04-06 DIAGNOSIS — Z7984 Long term (current) use of oral hypoglycemic drugs: Secondary | ICD-10-CM | POA: Diagnosis not present

## 2020-04-06 DIAGNOSIS — D630 Anemia in neoplastic disease: Secondary | ICD-10-CM | POA: Diagnosis not present

## 2020-04-06 DIAGNOSIS — Z87440 Personal history of urinary (tract) infections: Secondary | ICD-10-CM | POA: Diagnosis not present

## 2020-04-06 NOTE — Telephone Encounter (Signed)
-----   Message from Corrie Mckusick, DO sent at 04/05/2020  8:28 AM EST ----- Regarding: RE: Schedule IVC Retrieval Thank you.   No need to hold plavix, thanks.  JW  ----- Message ----- From: Danielle Dess Sent: 04/04/2020  10:46 AM EST To: Corrie Mckusick, DO Subject: FW: Schedule IVC Retrieval                     Earleen Newport,   Do you want this patient to hold her Plavix?  Thanks,  Lia Foyer ----- Message ----- From: Randa Spike Sent: 03/23/2020  12:46 PM EST To: Marcelyn Bruins, Joanell Rising, # Subject: Schedule IVC Retrieval                         Name: [name]  DOB: 05/31/1950  Procedure: IVC Filter Retrieval  Reason for procedure: Presence of IVC Filter  Relevant History: In Epic  Order Physician: Earleen Newport  Office Contact: Me [employer] MCR-No precert req outpatient per automated system 03/23/20 @ 12:35 pm for cpt code 86484.  Thank you

## 2020-04-11 DIAGNOSIS — Z951 Presence of aortocoronary bypass graft: Secondary | ICD-10-CM | POA: Diagnosis not present

## 2020-04-11 DIAGNOSIS — G4700 Insomnia, unspecified: Secondary | ICD-10-CM | POA: Diagnosis not present

## 2020-04-11 DIAGNOSIS — Z7984 Long term (current) use of oral hypoglycemic drugs: Secondary | ICD-10-CM | POA: Diagnosis not present

## 2020-04-11 DIAGNOSIS — Z7982 Long term (current) use of aspirin: Secondary | ICD-10-CM | POA: Diagnosis not present

## 2020-04-11 DIAGNOSIS — Z87891 Personal history of nicotine dependence: Secondary | ICD-10-CM | POA: Diagnosis not present

## 2020-04-11 DIAGNOSIS — R269 Unspecified abnormalities of gait and mobility: Secondary | ICD-10-CM | POA: Diagnosis not present

## 2020-04-11 DIAGNOSIS — Z7902 Long term (current) use of antithrombotics/antiplatelets: Secondary | ICD-10-CM | POA: Diagnosis not present

## 2020-04-11 DIAGNOSIS — I69351 Hemiplegia and hemiparesis following cerebral infarction affecting right dominant side: Secondary | ICD-10-CM | POA: Diagnosis not present

## 2020-04-11 DIAGNOSIS — C679 Malignant neoplasm of bladder, unspecified: Secondary | ICD-10-CM | POA: Diagnosis not present

## 2020-04-11 DIAGNOSIS — L989 Disorder of the skin and subcutaneous tissue, unspecified: Secondary | ICD-10-CM | POA: Diagnosis not present

## 2020-04-11 DIAGNOSIS — R531 Weakness: Secondary | ICD-10-CM | POA: Diagnosis not present

## 2020-04-11 DIAGNOSIS — I69320 Aphasia following cerebral infarction: Secondary | ICD-10-CM | POA: Diagnosis not present

## 2020-04-11 DIAGNOSIS — I359 Nonrheumatic aortic valve disorder, unspecified: Secondary | ICD-10-CM | POA: Diagnosis not present

## 2020-04-11 DIAGNOSIS — Z86718 Personal history of other venous thrombosis and embolism: Secondary | ICD-10-CM | POA: Diagnosis not present

## 2020-04-11 DIAGNOSIS — I10 Essential (primary) hypertension: Secondary | ICD-10-CM | POA: Diagnosis not present

## 2020-04-11 DIAGNOSIS — D630 Anemia in neoplastic disease: Secondary | ICD-10-CM | POA: Diagnosis not present

## 2020-04-11 DIAGNOSIS — E785 Hyperlipidemia, unspecified: Secondary | ICD-10-CM | POA: Diagnosis not present

## 2020-04-11 DIAGNOSIS — Z87440 Personal history of urinary (tract) infections: Secondary | ICD-10-CM | POA: Diagnosis not present

## 2020-04-11 DIAGNOSIS — K5900 Constipation, unspecified: Secondary | ICD-10-CM | POA: Diagnosis not present

## 2020-04-11 DIAGNOSIS — E119 Type 2 diabetes mellitus without complications: Secondary | ICD-10-CM | POA: Diagnosis not present

## 2020-04-13 DIAGNOSIS — D630 Anemia in neoplastic disease: Secondary | ICD-10-CM | POA: Diagnosis not present

## 2020-04-13 DIAGNOSIS — C679 Malignant neoplasm of bladder, unspecified: Secondary | ICD-10-CM | POA: Diagnosis not present

## 2020-04-13 DIAGNOSIS — Z951 Presence of aortocoronary bypass graft: Secondary | ICD-10-CM | POA: Diagnosis not present

## 2020-04-13 DIAGNOSIS — I10 Essential (primary) hypertension: Secondary | ICD-10-CM | POA: Diagnosis not present

## 2020-04-13 DIAGNOSIS — Z87891 Personal history of nicotine dependence: Secondary | ICD-10-CM | POA: Diagnosis not present

## 2020-04-13 DIAGNOSIS — Z7984 Long term (current) use of oral hypoglycemic drugs: Secondary | ICD-10-CM | POA: Diagnosis not present

## 2020-04-13 DIAGNOSIS — Z86718 Personal history of other venous thrombosis and embolism: Secondary | ICD-10-CM | POA: Diagnosis not present

## 2020-04-13 DIAGNOSIS — G4700 Insomnia, unspecified: Secondary | ICD-10-CM | POA: Diagnosis not present

## 2020-04-13 DIAGNOSIS — L989 Disorder of the skin and subcutaneous tissue, unspecified: Secondary | ICD-10-CM | POA: Diagnosis not present

## 2020-04-13 DIAGNOSIS — Z7902 Long term (current) use of antithrombotics/antiplatelets: Secondary | ICD-10-CM | POA: Diagnosis not present

## 2020-04-13 DIAGNOSIS — Z7982 Long term (current) use of aspirin: Secondary | ICD-10-CM | POA: Diagnosis not present

## 2020-04-13 DIAGNOSIS — Z87440 Personal history of urinary (tract) infections: Secondary | ICD-10-CM | POA: Diagnosis not present

## 2020-04-13 DIAGNOSIS — I359 Nonrheumatic aortic valve disorder, unspecified: Secondary | ICD-10-CM | POA: Diagnosis not present

## 2020-04-13 DIAGNOSIS — I69320 Aphasia following cerebral infarction: Secondary | ICD-10-CM | POA: Diagnosis not present

## 2020-04-13 DIAGNOSIS — E119 Type 2 diabetes mellitus without complications: Secondary | ICD-10-CM | POA: Diagnosis not present

## 2020-04-13 DIAGNOSIS — E785 Hyperlipidemia, unspecified: Secondary | ICD-10-CM | POA: Diagnosis not present

## 2020-04-13 DIAGNOSIS — I69351 Hemiplegia and hemiparesis following cerebral infarction affecting right dominant side: Secondary | ICD-10-CM | POA: Diagnosis not present

## 2020-04-13 DIAGNOSIS — K5900 Constipation, unspecified: Secondary | ICD-10-CM | POA: Diagnosis not present

## 2020-04-14 ENCOUNTER — Telehealth: Payer: Self-pay | Admitting: Neurology

## 2020-04-14 NOTE — Telephone Encounter (Signed)
Called original number back on call basket [phone]) reached VM for Lisa [name], however, there is a permanent comment that says to not give any information to [employer] (husband).  Checked DPR, as of 11/21 he was listed on DPR to give information too.  LVM on patient's phone to clarify and discuss call.  [phone].

## 2020-04-14 NOTE — Telephone Encounter (Signed)
Pt's sister-in-law, [name] called, would like to update her problems since she has got out of the hospital. Would like for Dr. Leonie Man to know before her appt on Monday. Would like a call from the nurse.

## 2020-04-18 ENCOUNTER — Ambulatory Visit: Payer: Medicare Other | Admitting: Neurology

## 2020-04-18 ENCOUNTER — Encounter: Payer: Self-pay | Admitting: Neurology

## 2020-04-18 VITALS — BP 138/82 | HR 86 | Ht 66.0 in | Wt 160.6 lb

## 2020-04-18 DIAGNOSIS — I69320 Aphasia following cerebral infarction: Secondary | ICD-10-CM

## 2020-04-18 DIAGNOSIS — I639 Cerebral infarction, unspecified: Secondary | ICD-10-CM | POA: Diagnosis not present

## 2020-04-18 DIAGNOSIS — I6522 Occlusion and stenosis of left carotid artery: Secondary | ICD-10-CM | POA: Diagnosis not present

## 2020-04-18 NOTE — Progress Notes (Signed)
[facility] [address] [place]. [place] [address] [phone]       OFFICE CONSULT NOTE  [name] Date of Birth:  1950/10/27 Medical Record Number:  [id number]   Referring MD: Rosalin Hawking  Reason for Referral: Stroke HPI:Ms [name] is a pleasant 70 year Caucasian lady seen today for initial office consultation for stroke.  She is accompanied by her husband.  History is obtained from them and review of electronic medical records and I personally reviewed pertinent imaging films in PACS.  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  She presented initially on 08/12/2019 as a code stroke with confusion for 5 days prior to admission.  MRI scan showed left MCA subacute infarct and carotid ultrasound showed 80 to 99% left ICA stenosis.  CT angiogram showed near occlusion of the left carotid at the origin of the internal carotid.  2D echo showed linear mobile density on the aortic valve and TEE was suggested but was recommended to be done as an outpatient.  LDL cholesterol 158 mg percent.  Hemoglobin A1c was 8.2.  Urine drug screen was positive for cannabis.  She was started on aspirin and Plavix for 3 months given his intracranial stenosis with CT angiogram showing slow flow in the left M2, distal left MCA vessels and left M2 branch occlusion and left M1 moderate stenosis.  She underwent left carotid TCAR procedure with stenting on 08/20/2019 by vascular surgery.  On 09/18/2019 with sudden onset of right facial droop and mild expressive aphasia and MRI scan showed small acute infarct in the posterior medial left thalamus.  NIH stroke scale was 5 but most of the deficit appeared to be old from previous stroke.  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  Lower extremity venous Doppler showed bilateral DVTs.  Patient was initially started on IV heparin but was stopped for the bladder cancer surgery and plan was to switch to Eliquis on discharge.  Patient however had IVC filter placed.  She now states that she has plans to have the filter removed and in a few weeks.  She is currently on aspirin alone.  She has had no further hematuria.  She has had no recurrent stroke or TIA symptoms.  Her blood pressure is well controlled today it is 138/82.  She is tolerating Lipitor well without any side effects.  She still has some intermittent speech difficulties and some numbness in the right hand off and on.  She is finished home therapies.  Her sister had noted that she has some cognitive issues with decreased judgment and depth perception but the husband feels that these are improving.  She does have easy bruising. ROS:   14 system review of systems is positive for numbness, weakness, speech and word finding difficulty, word hesitancy all other systems negative  PMH:  Past Medical History:  Diagnosis Date  . Anemia   . Cancer ([place])   . Carotid arterial disease ([place]) 07/2019  . Diabetes ([place])   . HTN (hypertension)   . Hyperlipidemia   . Stroke [facility]    acute/subacute left MCA infarct 08/12/19    Social History:  Social History   Socioeconomic History  . Marital status: Married    Spouse name: Not on file  . Number of  children: Not on file  . Years of education: Not on file  . Highest education level: Not on file  Occupational History  . Not on file  Tobacco Use  . Smoking status: Former Smoker    Packs/day: 0.50    Types: Cigarettes    Quit date: 07/28/2019    Years since quitting: 0.7  . Smokeless tobacco: Never Used  Vaping Use  . Vaping Use: Never used  Substance and Sexual Activity  . Alcohol use: Not Currently  . Drug use: Never  . Sexual activity: Yes  Other Topics Concern  . Not on file  Social History Narrative    Lives Independent Living with husband, Lisa [name] in [place].    Left Handed   Drinks no caffeine   Social Determinants of Radio broadcast assistant Strain: Not on file  Food Insecurity: Not on file  Transportation Needs: Not on file  Physical Activity: Not on file  Stress: Not on file  Social Connections: Not on file  Intimate Partner Violence: Not on file    Medications:   Current Outpatient Medications on File Prior to Visit  Medication Sig Dispense Refill  . amLODipine (NORVASC) 10 MG tablet TAKE 1 TABLET ONCE DAILY. 90 tablet 0  . aspirin 81 MG EC tablet Take 1 tablet (81 mg total) by mouth daily. Swallow whole. 90 tablet 1  . atorvastatin (LIPITOR) 20 MG tablet Take 1 tablet (20 mg total) by mouth daily. 90 tablet 1  . docusate sodium (COLACE) 100 MG capsule Take 1 capsule (100 mg total) by mouth daily. 90 capsule 1  . losartan (COZAAR) 25 MG tablet Take 1 tablet (25 mg total) by mouth in the morning. 90 tablet 1  . Melatonin 10 MG TABS Take 10 mg by mouth at bedtime. 90 tablet 1  . metFORMIN (GLUCOPHAGE XR) 500 MG 24 hr tablet Take 1 tablet (500 mg total) by mouth daily with breakfast. 90 tablet 0  . Multiple Vitamin tablet Take 1 tablet by mouth daily.     . Multiple Vitamins-Minerals (HIGH POT MULTIVITAMIN/BETA-CAR) TABS Take 1 tablet by mouth daily.    . polyethylene glycol (MIRALAX / GLYCOLAX) 17 g packet Take 17 g by mouth daily as needed for mild constipation or moderate constipation. 30 each 2  . terbinafine (LAMISIL) 250 MG tablet Take 250 mg by mouth daily.    . traZODone (DESYREL) 50 MG tablet Take 1 tablet (50 mg total) by mouth at bedtime. 90 tablet 0   No current facility-administered medications on file prior to visit.    Allergies:  No Known Allergies  Physical Exam General: Mildly obese middle-aged Caucasian lady, seated, in no evident distress Head: head normocephalic and atraumatic.   Neck: supple with no carotid or supraclavicular  bruits Cardiovascular: regular rate and rhythm, no murmurs Musculoskeletal: no deformity Skin:  no rash/petichiae Vascular:  Normal pulses all extremities  Neurologic Exam Mental Status: Awake and fully alert. Oriented to place and time. Recent and remote memory intact. Attention span, concentration and fund of knowledge appropriate. Mood and affect appropriate.  Speech mostly fluent with occasional hesitancy and word finding difficulty.  No paraphasic errors.  Good comprehension. Cranial Nerves: Fundoscopic exam reveals sharp disc margins. Pupils equal, briskly reactive to light. Extraocular movements full without nystagmus. Visual fields full to confrontation. Hearing intact. Facial sensation intact.  Mild right nasolabial fold asymmetry., tongue, palate moves normally and symmetrically.  Motor: Normal bulk and tone. Normal strength in all tested extremity muscles except  mild weakness of right grip and intrinsic hand muscles.  Diminished fine finger movements on the right.  Orbits left over right upper extremity.. Sensory.: intact to touch , pinprick , position and vibratory sensation.  Subjective paresthesias in the right hand but no objective sensory loss. Coordination: Rapid alternating movements normal in all extremities. Finger-to-nose and heel-to-shin performed accurately bilaterally. Gait and Station: Arises from chair without difficulty. Stance is normal. Gait demonstrates normal stride length and balance . Able to heel, toe and tandem walk without difficulty.  Reflexes: 1+ and symmetric. Toes downgoing.   NIHSS 2 Modified Rankin  2  ASSESSMENT: 70 year old Caucasian lady with symptomatic left hemispheric MCA branch infarct in June 2021 from severe proximal carotid stenosis as well as recurrent small stroke in August 2021 with vascular risk factors of carotid stenosis, hypertension, hyperlipidemia, diabetes and mild obesity.  She may have mild cognitive impairment post  stroke.     PLAN: I had a long d/w patient and her husband about her  recent strokes, symptomatic carotid stenosis and stent,risk for recurrent stroke/TIAs, personally independently reviewed imaging studies and stroke evaluation results and answered questions.Continue aspirin 81 mg daily alone and stop Plavix as it has been more than 6 months since her stroke and stent for secondary stroke prevention and maintain strict control of hypertension with blood pressure goal below 130/90, diabetes with hemoglobin A1c goal below 6.5% and lipids with LDL cholesterol goal below 70 mg/dL. I also advised the patient to eat a healthy diet with plenty of whole grains, cereals, fruits and vegetables, exercise regularly and maintain ideal body weight.  I recommend she increase participation in cognitively challenging activities like solving crossword puzzles, playing bridge and sodoku and we also discussed memory compensation strategies.  Followup in the future with my nurse practitioner Janett Billow in 6 months or call earlier if necessary.  Greater than 50% time during this 45-minute consultation visit was spent on counseling and coordination of care about her strokes and symptomatic carotid stenosis and discussion about stroke risk and prevention and treatment and answering questions. Antony Contras, MD  [facility] [address] [place], [place] [address]  Phone [phone] Fax [phone] Note: This document was prepared with digital dictation and possible smart phrase technology. Any transcriptional errors that result from this process are unintentional.

## 2020-04-18 NOTE — Patient Instructions (Signed)
I had a long d/w patient and her husband about her  recent strokes, symptomatic carotid stenosis and stentrisk for recurrent stroke/TIAs, personally independently reviewed imaging studies and stroke evaluation results and answered questions.Continue aspirin 81 mg daily alone and stop Plavix as it has been more than 6 months since her stroke and stent for secondary stroke prevention and maintain strict control of hypertension with blood pressure goal below 130/90, diabetes with hemoglobin A1c goal below 6.5% and lipids with LDL cholesterol goal below 70 mg/dL. I also advised the patient to eat a healthy diet with plenty of whole grains, cereals, fruits and vegetables, exercise regularly and maintain ideal body weight.  I recommend she increase participation in cognitively challenging activities like solving crossword puzzles, playing bridge and sodoku and we also discussed memory compensation strategies.  Followup in the future with my nurse practitioner Janett Billow in 6 months or call earlier if necessary. Memory Compensation Strategies  1. Use "WARM" strategy.  W= write it down  A= associate it  R= repeat it  M= make a mental note  2.   You can keep a Social worker.  Use a 3-ring notebook with sections for the following: calendar, important names and phone numbers,  medications, doctors' names/phone numbers, lists/reminders, and a section to journal what you did  each day.   3.    Use a calendar to write appointments down.  4.    Write yourself a schedule for the day.  This can be placed on the calendar or in a separate section of the Memory Notebook.  Keeping a  regular schedule can help memory.  5.    Use medication organizer with sections for each day or morning/evening pills.  You may need help loading it  6.    Keep a basket, or pegboard by the door.  Place items that you need to take out with you in the basket or on the pegboard.  You may also want to  include a message board for  reminders.  7.    Use sticky notes.  Place sticky notes with reminders in a place where the task is performed.  For example: " turn off the  stove" placed by the stove, "lock the door" placed on the door at eye level, " take your medications" on  the bathroom mirror or by the place where you normally take your medications.  8.    Use alarms/timers.  Use while cooking to remind yourself to check on food or as a reminder to take your medicine, or as a  reminder to make a call, or as a reminder to perform another task, etc.   Stroke Prevention Some medical conditions and behaviors are associated with a higher chance of having a stroke. You can help prevent a stroke by making nutrition, lifestyle, and other changes, including managing any medical conditions you may have. What nutrition changes can be made?  Eat healthy foods. You can do this by: ? Choosing foods high in fiber, such as fresh fruits and vegetables and whole grains. ? Eating at least 5 or more servings of fruits and vegetables a day. Try to fill half of your plate at each meal with fruits and vegetables. ? Choosing lean protein foods, such as lean cuts of meat, poultry without skin, fish, tofu, beans, and nuts. ? Eating low-fat dairy products. ? Avoiding foods that are high in salt (sodium). This can help lower blood pressure. ? Avoiding foods that have saturated fat, trans fat, and  cholesterol. This can help prevent high cholesterol. ? Avoiding processed and premade foods.  Follow your health care provider's specific guidelines for losing weight, controlling high blood pressure (hypertension), lowering high cholesterol, and managing diabetes. These may include: ? Reducing your daily calorie intake. ? Limiting your daily sodium intake to 1,500 milligrams (mg). ? Using only healthy fats for cooking, such as olive oil, canola oil, or sunflower oil. ? Counting your daily carbohydrate intake.   What lifestyle changes can be  made?  Maintain a healthy weight. Talk to your health care provider about your ideal weight.  Get at least 30 minutes of moderate physical activity at least 5 days a week. Moderate activity includes brisk walking, biking, and swimming.  Do not use any products that contain nicotine or tobacco, such as cigarettes and e-cigarettes. If you need help quitting, ask your health care provider. It may also be helpful to avoid exposure to secondhand smoke.  Limit alcohol intake to no more than 1 drink a day for nonpregnant women and 2 drinks a day for men. One drink equals 12 oz of beer, 5 oz of wine, or 1 oz of hard liquor.  Stop any illegal drug use.  Avoid taking birth control pills. Talk to your health care provider about the risks of taking birth control pills if: ? You are over 24 years old. ? You smoke. ? You get migraines. ? You have ever had a blood clot. What other changes can be made?  Manage your cholesterol levels. ? Eating a healthy diet is important for preventing high cholesterol. If cholesterol cannot be managed through diet alone, you may also need to take medicines. ? Take any prescribed medicines to control your cholesterol as told by your health care provider.  Manage your diabetes. ? Eating a healthy diet and exercising regularly are important parts of managing your blood sugar. If your blood sugar cannot be managed through diet and exercise, you may need to take medicines. ? Take any prescribed medicines to control your diabetes as told by your health care provider.  Control your hypertension. ? To reduce your risk of stroke, try to keep your blood pressure below 130/80. ? Eating a healthy diet and exercising regularly are an important part of controlling your blood pressure. If your blood pressure cannot be managed through diet and exercise, you may need to take medicines. ? Take any prescribed medicines to control hypertension as told by your health care  provider. ? Ask your health care provider if you should monitor your blood pressure at home. ? Have your blood pressure checked every year, even if your blood pressure is normal. Blood pressure increases with age and some medical conditions.  Get evaluated for sleep disorders (sleep apnea). Talk to your health care provider about getting a sleep evaluation if you snore a lot or have excessive sleepiness.  Take over-the-counter and prescription medicines only as told by your health care provider. Aspirin or blood thinners (antiplatelets or anticoagulants) may be recommended to reduce your risk of forming blood clots that can lead to stroke.  Make sure that any other medical conditions you have, such as atrial fibrillation or atherosclerosis, are managed. What are the warning signs of a stroke? The warning signs of a stroke can be easily remembered as BEFAST.  B is for balance. Signs include: ? Dizziness. ? Loss of balance or coordination. ? Sudden trouble walking.  E is for eyes. Signs include: ? A sudden change in vision. ? Trouble  seeing.  F is for face. Signs include: ? Sudden weakness or numbness of the face. ? The face or eyelid drooping to one side.  A is for arms. Signs include: ? Sudden weakness or numbness of the arm, usually on one side of the body.  S is for speech. Signs include: ? Trouble speaking (aphasia). ? Trouble understanding.  T is for time. ? These symptoms may represent a serious problem that is an emergency. Do not wait to see if the symptoms will go away. Get medical help right away. Call your local emergency services (911 in the [place] Do not drive yourself to the hospital.  Other signs of stroke may include: ? A sudden, severe headache with no known cause. ? Nausea or vomiting. ? Seizure. Where to find more information For more information, visit:  American Stroke Association: www.strokeassociation.org  National Stroke Association:  www.stroke.org Summary  You can prevent a stroke by eating healthy, exercising, not smoking, limiting alcohol intake, and managing any medical conditions you may have.  Do not use any products that contain nicotine or tobacco, such as cigarettes and e-cigarettes. If you need help quitting, ask your health care provider. It may also be helpful to avoid exposure to secondhand smoke.  Remember BEFAST for warning signs of stroke. Get help right away if you or a loved one has any of these signs. This information is not intended to replace advice given to you by your health care provider. Make sure you discuss any questions you have with your health care provider. Document Revised: 01/11/2017 Document Reviewed: 03/06/2016 Elsevier Patient Education  2021 [employer].

## 2020-04-19 DIAGNOSIS — Z7902 Long term (current) use of antithrombotics/antiplatelets: Secondary | ICD-10-CM | POA: Diagnosis not present

## 2020-04-19 DIAGNOSIS — G4700 Insomnia, unspecified: Secondary | ICD-10-CM | POA: Diagnosis not present

## 2020-04-19 DIAGNOSIS — I359 Nonrheumatic aortic valve disorder, unspecified: Secondary | ICD-10-CM | POA: Diagnosis not present

## 2020-04-19 DIAGNOSIS — E785 Hyperlipidemia, unspecified: Secondary | ICD-10-CM | POA: Diagnosis not present

## 2020-04-19 DIAGNOSIS — I69351 Hemiplegia and hemiparesis following cerebral infarction affecting right dominant side: Secondary | ICD-10-CM | POA: Diagnosis not present

## 2020-04-19 DIAGNOSIS — Z87891 Personal history of nicotine dependence: Secondary | ICD-10-CM | POA: Diagnosis not present

## 2020-04-19 DIAGNOSIS — Z87440 Personal history of urinary (tract) infections: Secondary | ICD-10-CM | POA: Diagnosis not present

## 2020-04-19 DIAGNOSIS — E119 Type 2 diabetes mellitus without complications: Secondary | ICD-10-CM | POA: Diagnosis not present

## 2020-04-19 DIAGNOSIS — Z951 Presence of aortocoronary bypass graft: Secondary | ICD-10-CM | POA: Diagnosis not present

## 2020-04-19 DIAGNOSIS — Z7984 Long term (current) use of oral hypoglycemic drugs: Secondary | ICD-10-CM | POA: Diagnosis not present

## 2020-04-19 DIAGNOSIS — C679 Malignant neoplasm of bladder, unspecified: Secondary | ICD-10-CM | POA: Diagnosis not present

## 2020-04-19 DIAGNOSIS — Z86718 Personal history of other venous thrombosis and embolism: Secondary | ICD-10-CM | POA: Diagnosis not present

## 2020-04-19 DIAGNOSIS — Z7982 Long term (current) use of aspirin: Secondary | ICD-10-CM | POA: Diagnosis not present

## 2020-04-19 DIAGNOSIS — L989 Disorder of the skin and subcutaneous tissue, unspecified: Secondary | ICD-10-CM | POA: Diagnosis not present

## 2020-04-19 DIAGNOSIS — I10 Essential (primary) hypertension: Secondary | ICD-10-CM | POA: Diagnosis not present

## 2020-04-19 DIAGNOSIS — D630 Anemia in neoplastic disease: Secondary | ICD-10-CM | POA: Diagnosis not present

## 2020-04-19 DIAGNOSIS — K5900 Constipation, unspecified: Secondary | ICD-10-CM | POA: Diagnosis not present

## 2020-04-19 DIAGNOSIS — I69320 Aphasia following cerebral infarction: Secondary | ICD-10-CM | POA: Diagnosis not present

## 2020-04-20 DIAGNOSIS — E119 Type 2 diabetes mellitus without complications: Secondary | ICD-10-CM | POA: Diagnosis not present

## 2020-04-20 DIAGNOSIS — H2513 Age-related nuclear cataract, bilateral: Secondary | ICD-10-CM | POA: Diagnosis not present

## 2020-04-20 DIAGNOSIS — Z7984 Long term (current) use of oral hypoglycemic drugs: Secondary | ICD-10-CM | POA: Diagnosis not present

## 2020-04-20 LAB — HM DIABETES EYE EXAM

## 2020-04-22 DIAGNOSIS — Z7902 Long term (current) use of antithrombotics/antiplatelets: Secondary | ICD-10-CM | POA: Diagnosis not present

## 2020-04-22 DIAGNOSIS — I359 Nonrheumatic aortic valve disorder, unspecified: Secondary | ICD-10-CM | POA: Diagnosis not present

## 2020-04-22 DIAGNOSIS — C679 Malignant neoplasm of bladder, unspecified: Secondary | ICD-10-CM | POA: Diagnosis not present

## 2020-04-22 DIAGNOSIS — K5900 Constipation, unspecified: Secondary | ICD-10-CM | POA: Diagnosis not present

## 2020-04-22 DIAGNOSIS — Z951 Presence of aortocoronary bypass graft: Secondary | ICD-10-CM | POA: Diagnosis not present

## 2020-04-22 DIAGNOSIS — Z7984 Long term (current) use of oral hypoglycemic drugs: Secondary | ICD-10-CM | POA: Diagnosis not present

## 2020-04-22 DIAGNOSIS — E785 Hyperlipidemia, unspecified: Secondary | ICD-10-CM | POA: Diagnosis not present

## 2020-04-22 DIAGNOSIS — I69351 Hemiplegia and hemiparesis following cerebral infarction affecting right dominant side: Secondary | ICD-10-CM | POA: Diagnosis not present

## 2020-04-22 DIAGNOSIS — I69320 Aphasia following cerebral infarction: Secondary | ICD-10-CM | POA: Diagnosis not present

## 2020-04-22 DIAGNOSIS — G4700 Insomnia, unspecified: Secondary | ICD-10-CM | POA: Diagnosis not present

## 2020-04-22 DIAGNOSIS — D630 Anemia in neoplastic disease: Secondary | ICD-10-CM | POA: Diagnosis not present

## 2020-04-22 DIAGNOSIS — Z86718 Personal history of other venous thrombosis and embolism: Secondary | ICD-10-CM | POA: Diagnosis not present

## 2020-04-22 DIAGNOSIS — Z7982 Long term (current) use of aspirin: Secondary | ICD-10-CM | POA: Diagnosis not present

## 2020-04-22 DIAGNOSIS — Z87440 Personal history of urinary (tract) infections: Secondary | ICD-10-CM | POA: Diagnosis not present

## 2020-04-22 DIAGNOSIS — E119 Type 2 diabetes mellitus without complications: Secondary | ICD-10-CM | POA: Diagnosis not present

## 2020-04-22 DIAGNOSIS — I10 Essential (primary) hypertension: Secondary | ICD-10-CM | POA: Diagnosis not present

## 2020-04-22 DIAGNOSIS — L989 Disorder of the skin and subcutaneous tissue, unspecified: Secondary | ICD-10-CM | POA: Diagnosis not present

## 2020-04-22 DIAGNOSIS — Z87891 Personal history of nicotine dependence: Secondary | ICD-10-CM | POA: Diagnosis not present

## 2020-04-25 DIAGNOSIS — I69351 Hemiplegia and hemiparesis following cerebral infarction affecting right dominant side: Secondary | ICD-10-CM | POA: Diagnosis not present

## 2020-04-25 DIAGNOSIS — Z951 Presence of aortocoronary bypass graft: Secondary | ICD-10-CM | POA: Diagnosis not present

## 2020-04-25 DIAGNOSIS — L989 Disorder of the skin and subcutaneous tissue, unspecified: Secondary | ICD-10-CM | POA: Diagnosis not present

## 2020-04-25 DIAGNOSIS — I10 Essential (primary) hypertension: Secondary | ICD-10-CM | POA: Diagnosis not present

## 2020-04-25 DIAGNOSIS — Z87891 Personal history of nicotine dependence: Secondary | ICD-10-CM | POA: Diagnosis not present

## 2020-04-25 DIAGNOSIS — E785 Hyperlipidemia, unspecified: Secondary | ICD-10-CM | POA: Diagnosis not present

## 2020-04-25 DIAGNOSIS — Z87440 Personal history of urinary (tract) infections: Secondary | ICD-10-CM | POA: Diagnosis not present

## 2020-04-25 DIAGNOSIS — G4700 Insomnia, unspecified: Secondary | ICD-10-CM | POA: Diagnosis not present

## 2020-04-25 DIAGNOSIS — K5900 Constipation, unspecified: Secondary | ICD-10-CM | POA: Diagnosis not present

## 2020-04-25 DIAGNOSIS — Z7982 Long term (current) use of aspirin: Secondary | ICD-10-CM | POA: Diagnosis not present

## 2020-04-25 DIAGNOSIS — E119 Type 2 diabetes mellitus without complications: Secondary | ICD-10-CM | POA: Diagnosis not present

## 2020-04-25 DIAGNOSIS — Z7902 Long term (current) use of antithrombotics/antiplatelets: Secondary | ICD-10-CM | POA: Diagnosis not present

## 2020-04-25 DIAGNOSIS — I69320 Aphasia following cerebral infarction: Secondary | ICD-10-CM | POA: Diagnosis not present

## 2020-04-25 DIAGNOSIS — Z7984 Long term (current) use of oral hypoglycemic drugs: Secondary | ICD-10-CM | POA: Diagnosis not present

## 2020-04-25 DIAGNOSIS — Z86718 Personal history of other venous thrombosis and embolism: Secondary | ICD-10-CM | POA: Diagnosis not present

## 2020-04-25 DIAGNOSIS — D630 Anemia in neoplastic disease: Secondary | ICD-10-CM | POA: Diagnosis not present

## 2020-04-25 DIAGNOSIS — C679 Malignant neoplasm of bladder, unspecified: Secondary | ICD-10-CM | POA: Diagnosis not present

## 2020-04-25 DIAGNOSIS — I359 Nonrheumatic aortic valve disorder, unspecified: Secondary | ICD-10-CM | POA: Diagnosis not present

## 2020-04-26 ENCOUNTER — Encounter: Payer: Self-pay | Admitting: Family Medicine

## 2020-04-27 DIAGNOSIS — I10 Essential (primary) hypertension: Secondary | ICD-10-CM | POA: Diagnosis not present

## 2020-04-27 DIAGNOSIS — Z7984 Long term (current) use of oral hypoglycemic drugs: Secondary | ICD-10-CM | POA: Diagnosis not present

## 2020-04-27 DIAGNOSIS — E119 Type 2 diabetes mellitus without complications: Secondary | ICD-10-CM | POA: Diagnosis not present

## 2020-04-27 DIAGNOSIS — E785 Hyperlipidemia, unspecified: Secondary | ICD-10-CM | POA: Diagnosis not present

## 2020-04-27 DIAGNOSIS — Z7982 Long term (current) use of aspirin: Secondary | ICD-10-CM | POA: Diagnosis not present

## 2020-04-27 DIAGNOSIS — Z87440 Personal history of urinary (tract) infections: Secondary | ICD-10-CM | POA: Diagnosis not present

## 2020-04-27 DIAGNOSIS — G4700 Insomnia, unspecified: Secondary | ICD-10-CM | POA: Diagnosis not present

## 2020-04-27 DIAGNOSIS — L989 Disorder of the skin and subcutaneous tissue, unspecified: Secondary | ICD-10-CM | POA: Diagnosis not present

## 2020-04-27 DIAGNOSIS — K5900 Constipation, unspecified: Secondary | ICD-10-CM | POA: Diagnosis not present

## 2020-04-27 DIAGNOSIS — D630 Anemia in neoplastic disease: Secondary | ICD-10-CM | POA: Diagnosis not present

## 2020-04-27 DIAGNOSIS — I69320 Aphasia following cerebral infarction: Secondary | ICD-10-CM | POA: Diagnosis not present

## 2020-04-27 DIAGNOSIS — C679 Malignant neoplasm of bladder, unspecified: Secondary | ICD-10-CM | POA: Diagnosis not present

## 2020-04-27 DIAGNOSIS — I359 Nonrheumatic aortic valve disorder, unspecified: Secondary | ICD-10-CM | POA: Diagnosis not present

## 2020-04-27 DIAGNOSIS — I69351 Hemiplegia and hemiparesis following cerebral infarction affecting right dominant side: Secondary | ICD-10-CM | POA: Diagnosis not present

## 2020-04-27 DIAGNOSIS — Z87891 Personal history of nicotine dependence: Secondary | ICD-10-CM | POA: Diagnosis not present

## 2020-04-27 DIAGNOSIS — Z86718 Personal history of other venous thrombosis and embolism: Secondary | ICD-10-CM | POA: Diagnosis not present

## 2020-04-27 DIAGNOSIS — Z7902 Long term (current) use of antithrombotics/antiplatelets: Secondary | ICD-10-CM | POA: Diagnosis not present

## 2020-04-27 DIAGNOSIS — Z951 Presence of aortocoronary bypass graft: Secondary | ICD-10-CM | POA: Diagnosis not present

## 2020-04-28 ENCOUNTER — Inpatient Hospital Stay (HOSPITAL_COMMUNITY)
Admission: EM | Admit: 2020-04-28 | Discharge: 2020-05-02 | DRG: 314 | Disposition: A | Discharge status: Home Health | Payer: Medicare Other | Attending: Neurology | Admitting: Neurology

## 2020-04-28 ENCOUNTER — Emergency Department (HOSPITAL_COMMUNITY): Payer: Medicare Other

## 2020-04-28 ENCOUNTER — Other Ambulatory Visit: Payer: Self-pay | Admitting: Physician Assistant

## 2020-04-28 ENCOUNTER — Encounter (HOSPITAL_COMMUNITY): Payer: Self-pay | Admitting: Neurology

## 2020-04-28 ENCOUNTER — Inpatient Hospital Stay (HOSPITAL_COMMUNITY): Payer: Medicare Other

## 2020-04-28 DIAGNOSIS — G319 Degenerative disease of nervous system, unspecified: Secondary | ICD-10-CM | POA: Diagnosis not present

## 2020-04-28 DIAGNOSIS — I616 Nontraumatic intracerebral hemorrhage, multiple localized: Secondary | ICD-10-CM | POA: Diagnosis not present

## 2020-04-28 DIAGNOSIS — Z95828 Presence of other vascular implants and grafts: Secondary | ICD-10-CM

## 2020-04-28 DIAGNOSIS — Z8639 Personal history of other endocrine, nutritional and metabolic disease: Secondary | ICD-10-CM

## 2020-04-28 DIAGNOSIS — I951 Orthostatic hypotension: Secondary | ICD-10-CM | POA: Diagnosis not present

## 2020-04-28 DIAGNOSIS — G8191 Hemiplegia, unspecified affecting right dominant side: Secondary | ICD-10-CM | POA: Diagnosis present

## 2020-04-28 DIAGNOSIS — I2510 Atherosclerotic heart disease of native coronary artery without angina pectoris: Secondary | ICD-10-CM | POA: Diagnosis not present

## 2020-04-28 DIAGNOSIS — Z86718 Personal history of other venous thrombosis and embolism: Secondary | ICD-10-CM | POA: Diagnosis not present

## 2020-04-28 DIAGNOSIS — T801XXA Vascular complications following infusion, transfusion and therapeutic injection, initial encounter: Principal | ICD-10-CM | POA: Diagnosis present

## 2020-04-28 DIAGNOSIS — R471 Dysarthria and anarthria: Secondary | ICD-10-CM | POA: Diagnosis present

## 2020-04-28 DIAGNOSIS — Z8551 Personal history of malignant neoplasm of bladder: Secondary | ICD-10-CM | POA: Diagnosis not present

## 2020-04-28 DIAGNOSIS — I639 Cerebral infarction, unspecified: Secondary | ICD-10-CM

## 2020-04-28 DIAGNOSIS — Z79891 Long term (current) use of opiate analgesic: Secondary | ICD-10-CM

## 2020-04-28 DIAGNOSIS — Z87891 Personal history of nicotine dependence: Secondary | ICD-10-CM | POA: Diagnosis not present

## 2020-04-28 DIAGNOSIS — I629 Nontraumatic intracranial hemorrhage, unspecified: Secondary | ICD-10-CM

## 2020-04-28 DIAGNOSIS — Z7984 Long term (current) use of oral hypoglycemic drugs: Secondary | ICD-10-CM

## 2020-04-28 DIAGNOSIS — Z7982 Long term (current) use of aspirin: Secondary | ICD-10-CM

## 2020-04-28 DIAGNOSIS — I6389 Other cerebral infarction: Secondary | ICD-10-CM | POA: Diagnosis not present

## 2020-04-28 DIAGNOSIS — I610 Nontraumatic intracerebral hemorrhage in hemisphere, subcortical: Secondary | ICD-10-CM | POA: Diagnosis not present

## 2020-04-28 DIAGNOSIS — R55 Syncope and collapse: Secondary | ICD-10-CM

## 2020-04-28 DIAGNOSIS — Z8679 Personal history of other diseases of the circulatory system: Secondary | ICD-10-CM

## 2020-04-28 DIAGNOSIS — I10 Essential (primary) hypertension: Secondary | ICD-10-CM | POA: Diagnosis present

## 2020-04-28 DIAGNOSIS — G936 Cerebral edema: Secondary | ICD-10-CM | POA: Diagnosis not present

## 2020-04-28 DIAGNOSIS — I611 Nontraumatic intracerebral hemorrhage in hemisphere, cortical: Secondary | ICD-10-CM | POA: Diagnosis not present

## 2020-04-28 DIAGNOSIS — R29709 NIHSS score 9: Secondary | ICD-10-CM | POA: Diagnosis present

## 2020-04-28 DIAGNOSIS — I69320 Aphasia following cerebral infarction: Secondary | ICD-10-CM

## 2020-04-28 DIAGNOSIS — R531 Weakness: Secondary | ICD-10-CM | POA: Diagnosis not present

## 2020-04-28 DIAGNOSIS — Z8249 Family history of ischemic heart disease and other diseases of the circulatory system: Secondary | ICD-10-CM

## 2020-04-28 DIAGNOSIS — E119 Type 2 diabetes mellitus without complications: Secondary | ICD-10-CM | POA: Diagnosis present

## 2020-04-28 DIAGNOSIS — I672 Cerebral atherosclerosis: Secondary | ICD-10-CM | POA: Diagnosis not present

## 2020-04-28 DIAGNOSIS — Z8673 Personal history of transient ischemic attack (TIA), and cerebral infarction without residual deficits: Secondary | ICD-10-CM | POA: Diagnosis not present

## 2020-04-28 DIAGNOSIS — R059 Cough, unspecified: Secondary | ICD-10-CM

## 2020-04-28 DIAGNOSIS — I619 Nontraumatic intracerebral hemorrhage, unspecified: Secondary | ICD-10-CM

## 2020-04-28 DIAGNOSIS — Z9282 Status post administration of tPA (rtPA) in a different facility within the last 24 hours prior to admission to current facility: Secondary | ICD-10-CM

## 2020-04-28 DIAGNOSIS — Y848 Other medical procedures as the cause of abnormal reaction of the patient, or of later complication, without mention of misadventure at the time of the procedure: Secondary | ICD-10-CM | POA: Diagnosis present

## 2020-04-28 DIAGNOSIS — Z79899 Other long term (current) drug therapy: Secondary | ICD-10-CM

## 2020-04-28 DIAGNOSIS — I6300 Cerebral infarction due to thrombosis of unspecified precerebral artery: Secondary | ICD-10-CM | POA: Diagnosis not present

## 2020-04-28 DIAGNOSIS — R29810 Facial weakness: Secondary | ICD-10-CM | POA: Diagnosis not present

## 2020-04-28 DIAGNOSIS — E785 Hyperlipidemia, unspecified: Secondary | ICD-10-CM | POA: Diagnosis present

## 2020-04-28 DIAGNOSIS — I6521 Occlusion and stenosis of right carotid artery: Secondary | ICD-10-CM | POA: Diagnosis not present

## 2020-04-28 DIAGNOSIS — I6782 Cerebral ischemia: Secondary | ICD-10-CM | POA: Diagnosis not present

## 2020-04-28 DIAGNOSIS — R4701 Aphasia: Secondary | ICD-10-CM | POA: Diagnosis not present

## 2020-04-28 DIAGNOSIS — Z743 Need for continuous supervision: Secondary | ICD-10-CM | POA: Diagnosis not present

## 2020-04-28 DIAGNOSIS — Z20822 Contact with and (suspected) exposure to covid-19: Secondary | ICD-10-CM | POA: Diagnosis present

## 2020-04-28 DIAGNOSIS — G9389 Other specified disorders of brain: Secondary | ICD-10-CM | POA: Diagnosis not present

## 2020-04-28 DIAGNOSIS — R6889 Other general symptoms and signs: Secondary | ICD-10-CM | POA: Diagnosis not present

## 2020-04-28 DIAGNOSIS — R404 Transient alteration of awareness: Secondary | ICD-10-CM | POA: Diagnosis not present

## 2020-04-28 DIAGNOSIS — I69392 Facial weakness following cerebral infarction: Secondary | ICD-10-CM

## 2020-04-28 DIAGNOSIS — R410 Disorientation, unspecified: Secondary | ICD-10-CM | POA: Diagnosis not present

## 2020-04-28 DIAGNOSIS — I609 Nontraumatic subarachnoid hemorrhage, unspecified: Secondary | ICD-10-CM | POA: Diagnosis not present

## 2020-04-28 LAB — I-STAT CHEM 8, ED
BUN: 17 mg/dL (ref 8–23)
Calcium, Ion: 1.21 mmol/L (ref 1.15–1.40)
Chloride: 101 mmol/L (ref 98–111)
Creatinine, Ser: 0.9 mg/dL (ref 0.44–1.00)
Glucose, Bld: 129 mg/dL — ABNORMAL HIGH (ref 70–99)
HCT: 38 % (ref 36.0–46.0)
Hemoglobin: 12.9 g/dL (ref 12.0–15.0)
Potassium: 3.8 mmol/L (ref 3.5–5.1)
Sodium: 138 mmol/L (ref 135–145)
TCO2: 24 mmol/L (ref 22–32)

## 2020-04-28 LAB — DIFFERENTIAL
Abs Immature Granulocytes: 0.03 10*3/uL (ref 0.00–0.07)
Basophils Absolute: 0 10*3/uL (ref 0.0–0.1)
Basophils Relative: 0 %
Eosinophils Absolute: 0.2 10*3/uL (ref 0.0–0.5)
Eosinophils Relative: 2 %
Immature Granulocytes: 0 %
Lymphocytes Relative: 34 %
Lymphs Abs: 3.3 10*3/uL (ref 0.7–4.0)
Monocytes Absolute: 0.8 10*3/uL (ref 0.1–1.0)
Monocytes Relative: 8 %
Neutro Abs: 5.5 10*3/uL (ref 1.7–7.7)
Neutrophils Relative %: 56 %

## 2020-04-28 LAB — COMPREHENSIVE METABOLIC PANEL
ALT: 14 U/L (ref 0–44)
AST: 20 U/L (ref 15–41)
Albumin: 3.8 g/dL (ref 3.5–5.0)
Alkaline Phosphatase: 83 U/L (ref 38–126)
Anion gap: 10 (ref 5–15)
BUN: 15 mg/dL (ref 8–23)
CO2: 24 mmol/L (ref 22–32)
Calcium: 9.6 mg/dL (ref 8.9–10.3)
Chloride: 102 mmol/L (ref 98–111)
Creatinine, Ser: 0.64 mg/dL (ref 0.44–1.00)
GFR, Estimated: >60 mL/min (ref >60)
Glucose, Bld: 131 mg/dL — ABNORMAL HIGH (ref 70–99)
Potassium: 3.7 mmol/L (ref 3.5–5.1)
Sodium: 136 mmol/L (ref 135–145)
Total Bilirubin: 0.8 mg/dL (ref 0.3–1.2)
Total Protein: 6.5 g/dL (ref 6.5–8.1)

## 2020-04-28 LAB — PROTIME-INR
INR: 1 (ref 0.8–1.2)
Prothrombin Time: 12.7 seconds (ref 11.4–15.2)

## 2020-04-28 LAB — CBC
HCT: 39.7 % (ref 36.0–46.0)
Hemoglobin: 12.2 g/dL (ref 12.0–15.0)
MCH: 27.3 pg (ref 26.0–34.0)
MCHC: 30.7 g/dL (ref 30.0–36.0)
MCV: 88.8 fL (ref 80.0–100.0)
Platelets: 236 10*3/uL (ref 150–400)
RBC: 4.47 MIL/uL (ref 3.87–5.11)
RDW: 15.5 % (ref 11.5–15.5)
WBC: 9.8 10*3/uL (ref 4.0–10.5)
nRBC: 0 % (ref 0.0–0.2)

## 2020-04-28 LAB — GLUCOSE, CAPILLARY: Glucose-Capillary: 137 mg/dL — ABNORMAL HIGH (ref 70–99)

## 2020-04-28 LAB — RESP PANEL BY RT-PCR (FLU A&B, COVID) ARPGX2
Influenza A by PCR: NEGATIVE
Influenza B by PCR: NEGATIVE
SARS Coronavirus 2 by RT PCR: NEGATIVE

## 2020-04-28 LAB — CBG MONITORING, ED: Glucose-Capillary: 127 mg/dL — ABNORMAL HIGH (ref 70–99)

## 2020-04-28 LAB — ETHANOL: Alcohol, Ethyl (B): 147 mg/dL — ABNORMAL HIGH (ref <10)

## 2020-04-28 LAB — APTT: aPTT: 33 seconds (ref 24–36)

## 2020-04-28 LAB — HEMOGLOBIN AND HEMATOCRIT, BLOOD
HCT: 35.8 % — ABNORMAL LOW (ref 36.0–46.0)
Hemoglobin: 11.5 g/dL — ABNORMAL LOW (ref 12.0–15.0)

## 2020-04-28 IMAGING — CT CT ANGIO HEAD
2 of 7 series · 8 of 33 positions shown · IV contrast (omnipaque)
Comparison: Prior head CT from earlier the same day.

CLINICAL DATA: Initial evaluation for acute stroke, right-sided leg
weakness.

EXAM:
CT ANGIOGRAPHY HEAD AND NECK
TECHNIQUE: Multidetector CT imaging of the head and neck was performed using
the standard protocol during bolus administration of intravenous
contrast. Multiplanar CT image reconstructions and MIPs were
obtained to evaluate the vascular anatomy. Carotid stenosis
measurements (when applicable) are obtained utilizing NASCET
criteria, using the distal internal carotid diameter as the
denominator.
CONTRAST:  75mL OMNIPAQUE IOHEXOL 350 MG/ML SOLN

[Series 5: cta neck (person_name) · axial · 0.54mm/px · z∈[-669,-551]mm · 2 of 178 slices shown]
[im 60/178  soft-tissue]
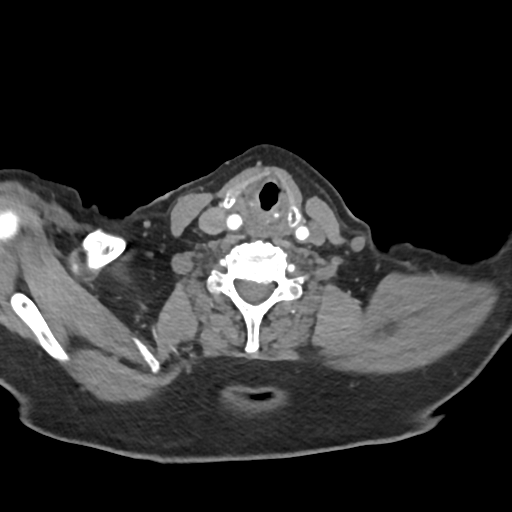
[im 119/178  soft-tissue]
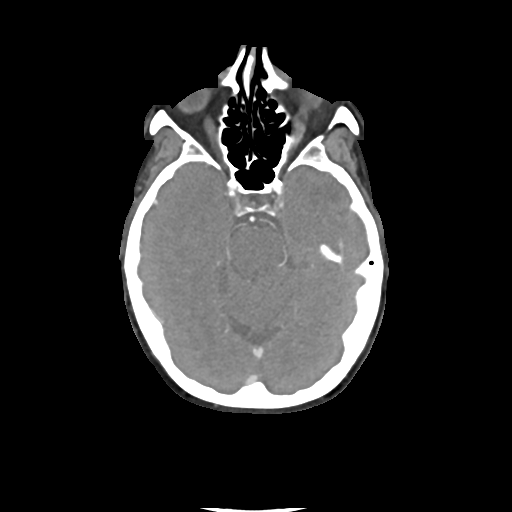

[Series 7: cta neck axial · axial · 0.39mm/px · z∈[-737,-489]mm · 6 of 348 slices shown]
[im 50/348  soft-tissue]
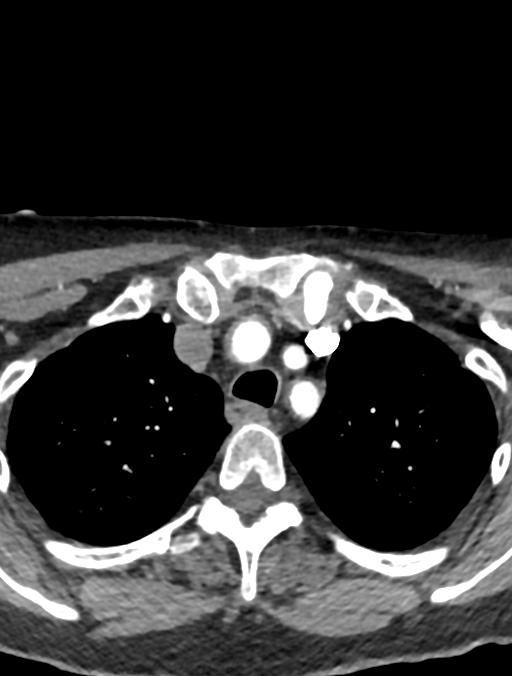
[im 100/348  bone]
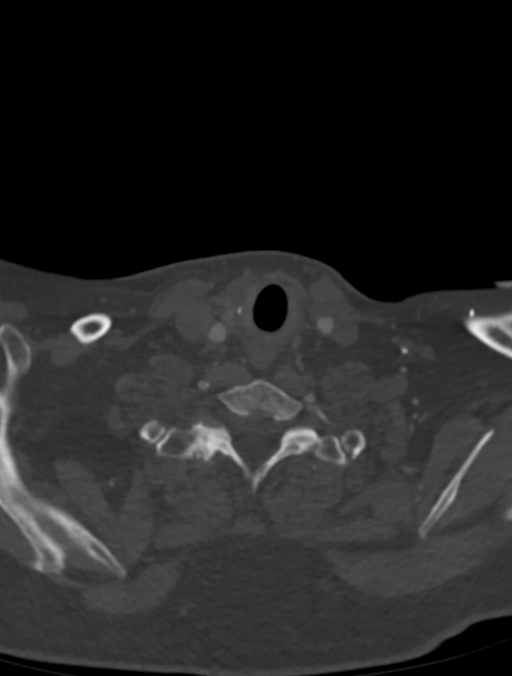
[im 149/348  soft-tissue]
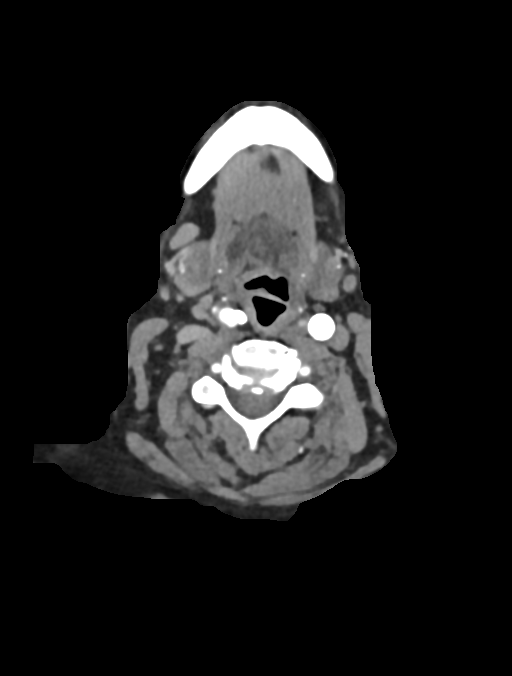
[im 199/348  bone]
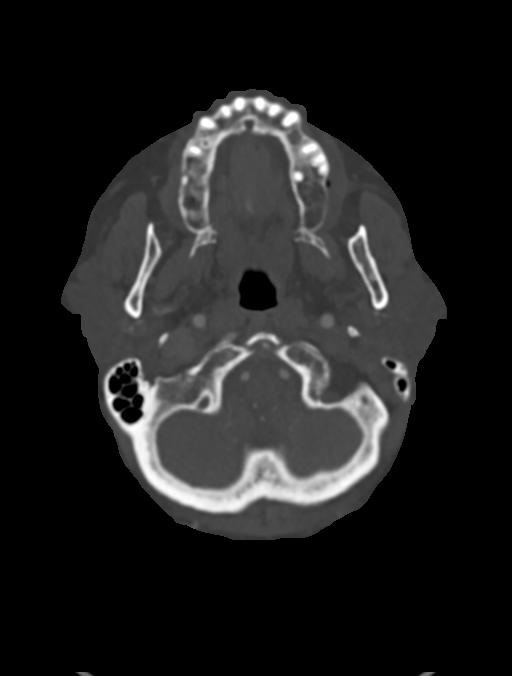
[im 248/348  soft-tissue]
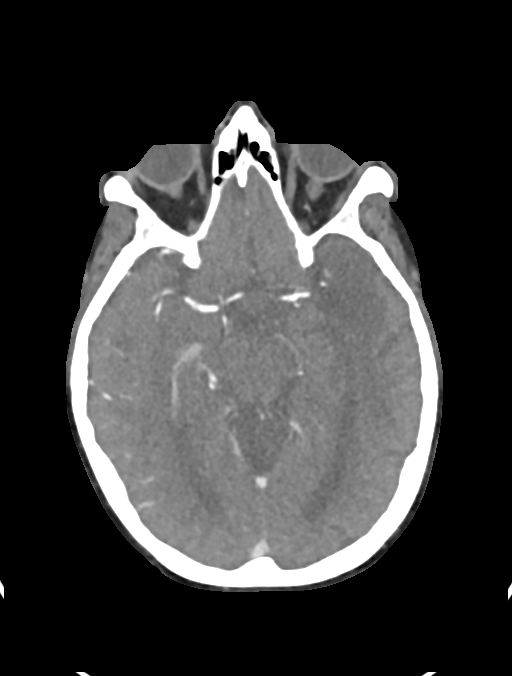
[im 298/348  bone]
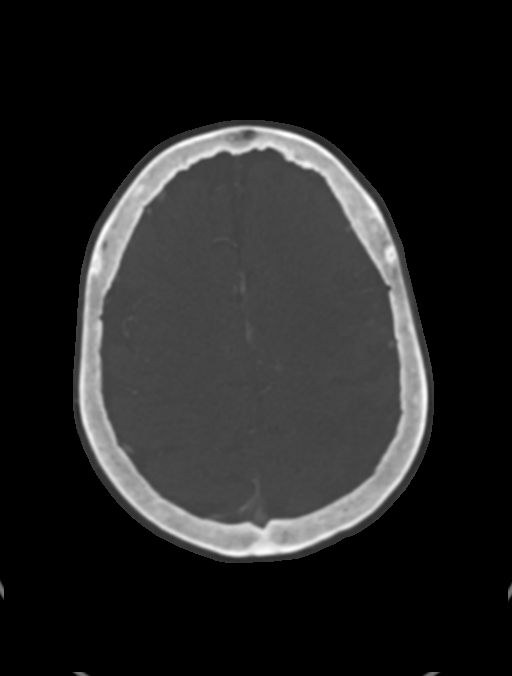

[8 of 33 positions shown; findings below may reference images not displayed]

FINDINGS: CTA NECK FINDINGS

Aortic arch: Visualized aortic arch of normal caliber with normal 3
vessel morphology. Mild atheromatous change about the arch and
origin of the great vessels without hemodynamically significant
stenosis.

Right carotid system: Right CCA regular but patent to the
bifurcation without flow-limiting stenosis. Bulky calcified plaque
about the right carotid bulb/proximal right ICA with associated
stenosis of up to approximate 50% by NASCET criteria. Right ICA
patent distally to the skull base without stenosis, dissection or
occlusion.

Left carotid system: Left CCA patent from its origin to the
bifurcation. Vascular stent traverses the left carotid bifurcation
and extends into the proximal left ICA. Widely patent flow through
the stent. Left ICA widely patent distally without stenosis,
evidence for dissection or occlusion.

Vertebral arteries: Both vertebral arteries arise from the
subclavian arteries. Vertebral arteries patent within the neck
without stenosis, dissection or occlusion.

Skeleton: No acute osseous finding. No discrete or worrisome osseous
lesions. Congenital fusion of the C6 and C7 vertebral bodies noted.
Moderate spondylosis noted elsewhere within the cervical spine
without high-grade stenosis.

Other neck: No other acute soft tissue abnormality within the neck.
No mass or adenopathy.

Upper chest: Visualized upper chest demonstrates no acute finding.

Review of the MIP images confirms the above findings

CTA HEAD FINDINGS

Anterior circulation: Petrous segments patent bilaterally. Scattered
atheromatous plaque throughout the carotid siphons with associated
mild to moderate multifocal narrowing. A1 segments patent
bilaterally. Normal anterior communicating artery complex. Anterior
cerebral arteries patent to their distal aspects without high-grade
stenosis. No M1 stenosis or occlusion. Negative MCA bifurcations. No
visible proximal MCA branch occlusion. Distal left MCA branches
somewhat small and atrophic in appearance related to the chronic
left MCA distribution infarct. Diffuse small vessel atheromatous
irregularity.

Posterior circulation: Both V4 segments patent to the
vertebrobasilar junction without high-grade stenosis. Both PICA
origins patent and normal. Basilar irregular but patent to its
distal aspect without significant stenosis. Superior cerebellar
arteries patent bilaterally. Right PCA supplied via the basilar.
Predominant fetal type origin of the left PCA. Both PCAs remain
patent to their distal aspects without stenosis.

Venous sinuses: Grossly patent allowing for timing the contrast
bolus.

Anatomic variants: Predominant fetal type origin of the left PCA. No
aneurysm.

Review of the MIP images confirms the above findings
IMPRESSION: 1. Negative CTA for emergent large vessel occlusion.
2. 50% atheromatous stenosis at the origin of the right ICA.
3. Widely patent left carotid stent.
4. Moderate intracranial atherosclerotic disease without proximal
high-grade or correctable stenosis.

Critical Value/emergent results were called by telephone at the time
of interpretation on [DATE] at [DATE] to provider BERTAIN
BERTAIN , who verbally acknowledged these results.

## 2020-04-28 IMAGING — CT CT HEAD CODE STROKE
3 series · 15 of 47 positions shown, 18 images · non-contrast
Comparison: Prior CT and MRI from [DATE].

CLINICAL DATA: Code stroke. Initial evaluation for acute
right-sided weakness with facial droop. Stroke 6

EXAM:
CT HEAD WITHOUT CONTRAST
TECHNIQUE: Contiguous axial images were obtained from the base of the skull
through the vertex without intravenous contrast.

[Series 4: head 5.0 st · axial · 0.42mm/px · z∈[-582,-448]mm · 9 of 33 slices shown, 12 images]
[im 3/33  brain]
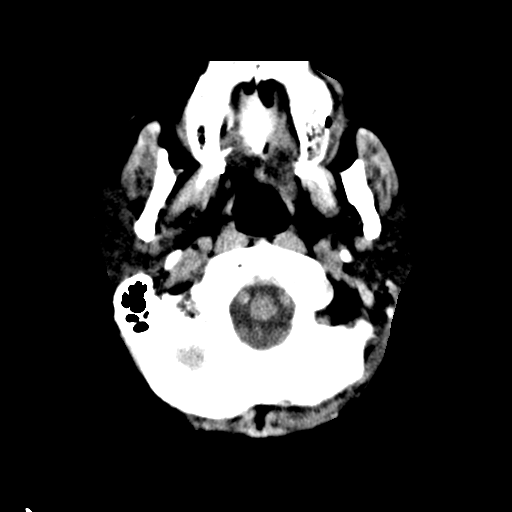
[im 3/33  bone]
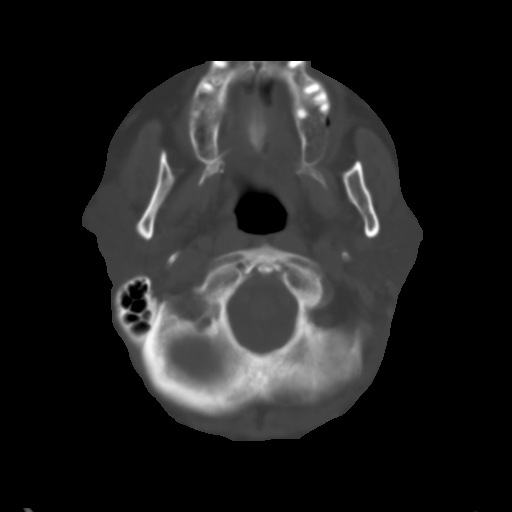
[im 6/33  brain]
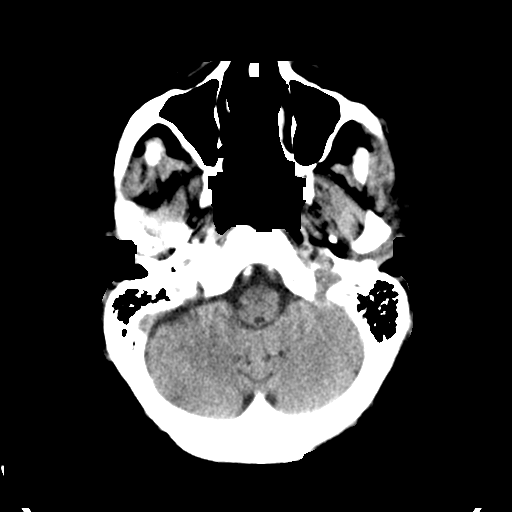
[im 9/33  brain]
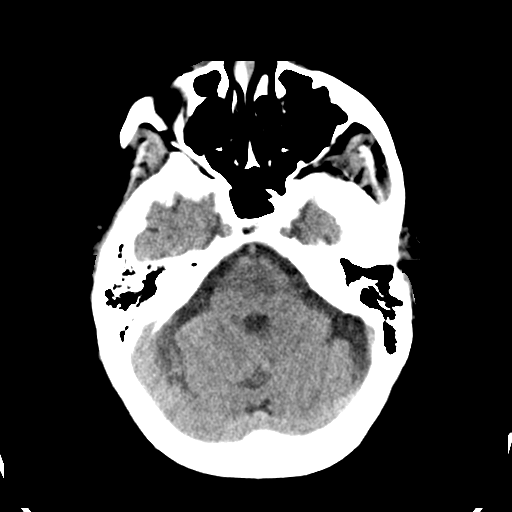
[im 13/33  brain]
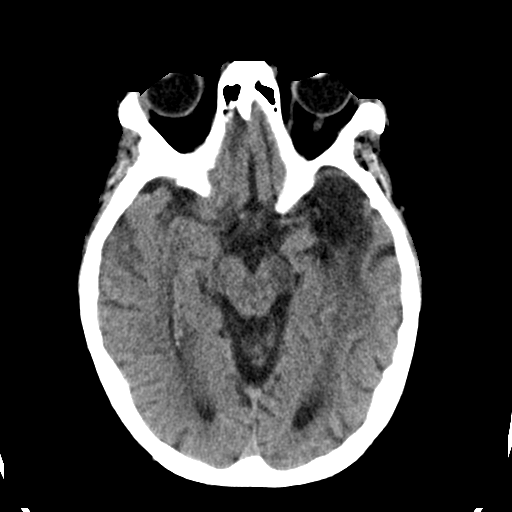
[im 17/33  brain]
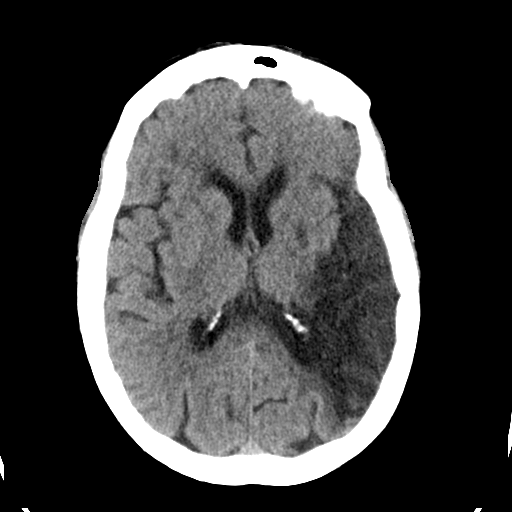
[im 17/33  bone]
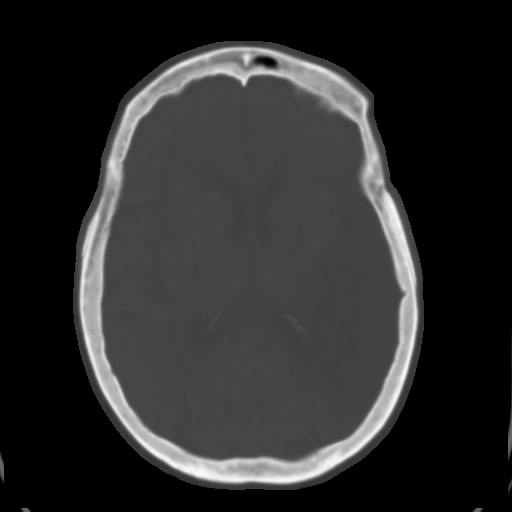
[im 20/33  brain]
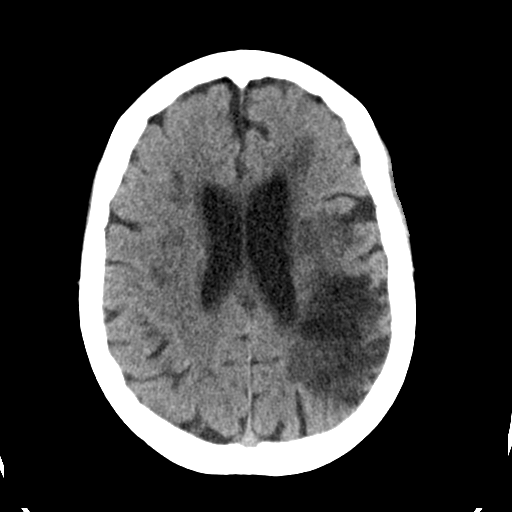
[im 24/33  brain]
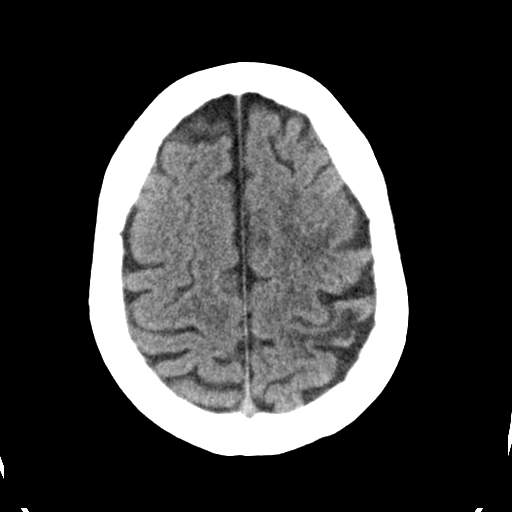
[im 27/33  brain]
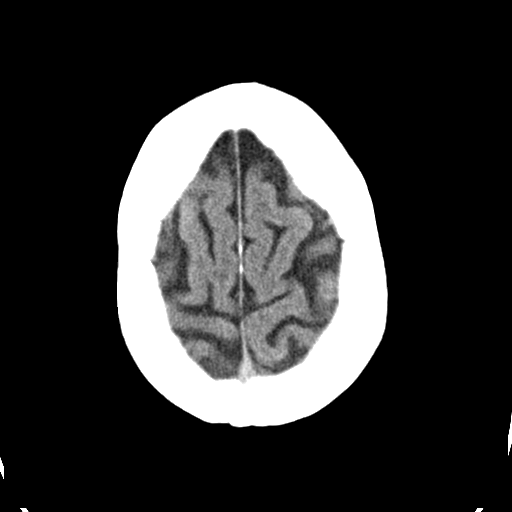
[im 30/33  brain]
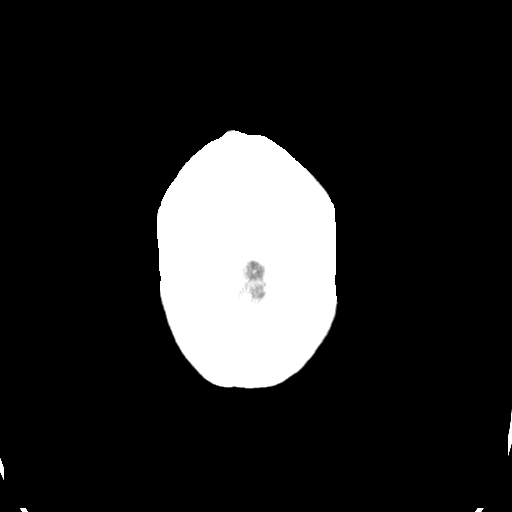
[im 30/33  bone]
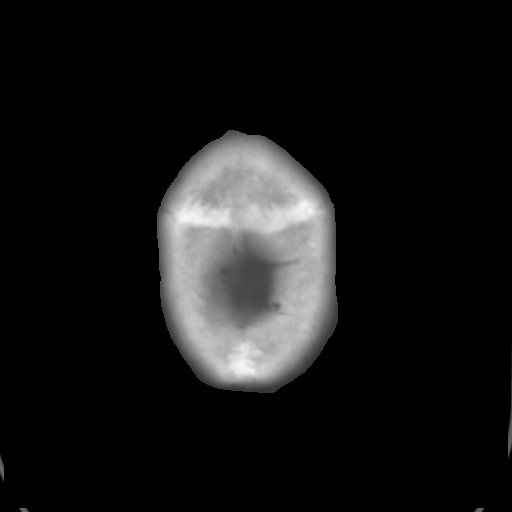

[Series 6: head 3.0 cor st · coronal · 0.32mm/px · 3 of 70 slices shown]
[im 24/70  brain]
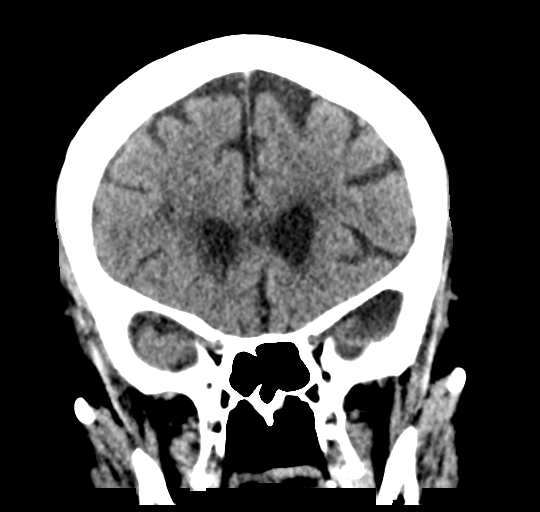
[im 31/70  brain]
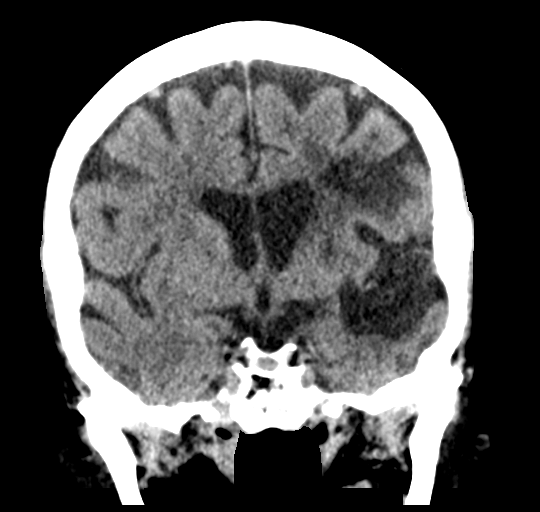
[im 39/70  brain]
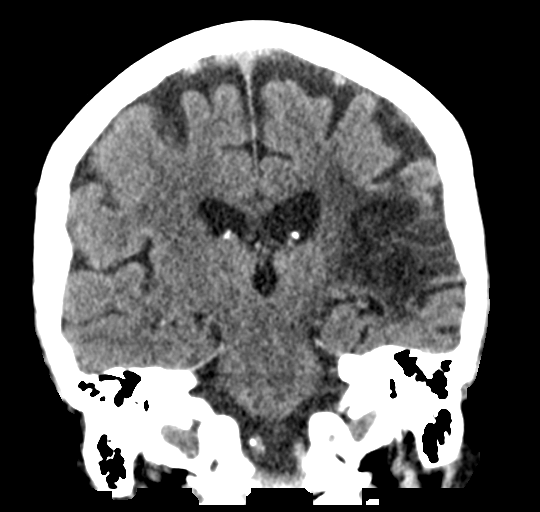

[Series 7: head 3.0 sag st · sagittal · 0.32mm/px · 3 of 55 slices shown]
[im 19/55  brain]
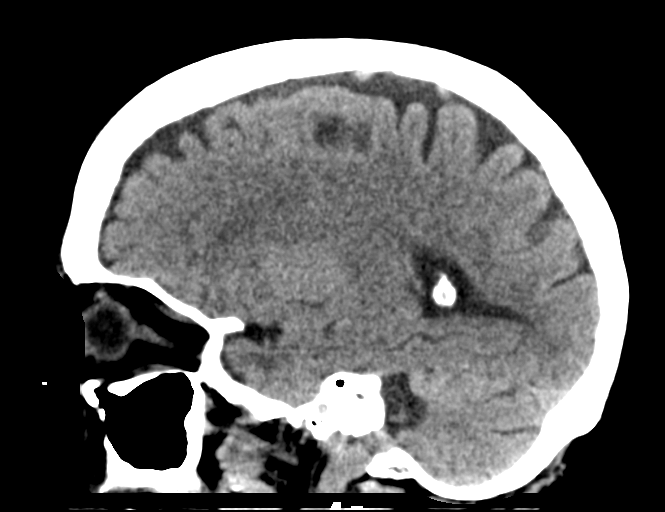
[im 28/55  brain]
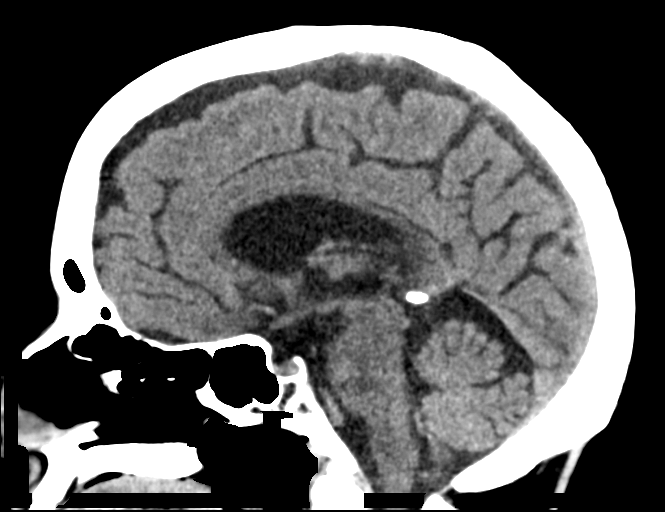
[im 37/55  brain]
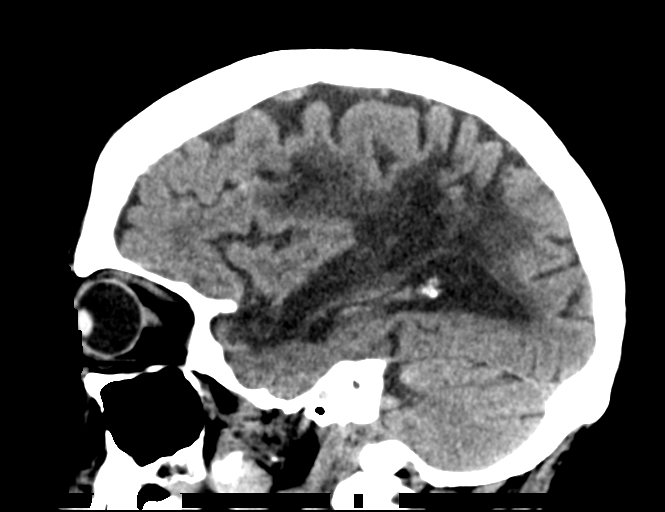

[15 of 47 positions shown; findings below may reference images not displayed]

FINDINGS: Brain: Generalized age-related cerebral atrophy with chronic small
vessel ischemic disease. Extensive encephalomalacia throughout the
left cerebral hemisphere, consistent with a chronic left MCA
distribution infarct. Small focus of mild hyperdensity at the
anterior margin of the chronic ischemic infarct favored to be most
consistent with a small amount of residual laminar necrosis (series
4, image 22). Wallerian degeneration noted at the left cerebral
peduncle and midbrain.

No other evidence for acute intracranial hemorrhage. No other acute
large vessel territory infarct. No mass lesion, midline shift or
mass effect. No hydrocephalus or extra-axial fluid collection.

Vascular: No hyperdense vessel. Calcified atherosclerosis at the
skull base.

Skull: Scalp soft tissues and calvarium within normal limits.

Sinuses/Orbits: Right gaze noted. Globes and orbital soft tissues
demonstrate no other acute finding. Paranasal sinuses are clear. No
mastoid effusion.

Other: None.

ASPECTS (Alberta Stroke Program Early CT Score)

- Ganglionic level infarction (caudate, lentiform nuclei, internal
capsule, insula, M1-M3 cortex): 7

- Supraganglionic infarction (M4-M6 cortex): 3

Total score (0-10 with 10 being normal): 10
IMPRESSION: 1. No acute intracranial infarct or other abnormality.
2. ASPECTS is 10.
3. Large remote left MCA distribution infarct. Small focus of
hyperdensity along the anterior margin of the chronic ischemic
infarct favored to reflect a small amount of residual laminar
necrosis.

These results were communicated to Dr. RIANO at [DATE] pmon
[DATE]by text page via the [employer] messaging system.

## 2020-04-28 IMAGING — CT CT ANGIO HEAD-NECK (W OR W/O PERF)
2 of 7 series · 8 of 33 positions shown · IV contrast (omnipaque)
Comparison: Prior head CT from earlier the same day.

CLINICAL DATA: Initial evaluation for acute stroke, right-sided leg
weakness.

EXAM:
CT ANGIOGRAPHY HEAD AND NECK
TECHNIQUE: Multidetector CT imaging of the head and neck was performed using
the standard protocol during bolus administration of intravenous
contrast. Multiplanar CT image reconstructions and MIPs were
obtained to evaluate the vascular anatomy. Carotid stenosis
measurements (when applicable) are obtained utilizing NASCET
criteria, using the distal internal carotid diameter as the
denominator.
CONTRAST:  75mL OMNIPAQUE IOHEXOL 350 MG/ML SOLN

[Series 5: cta neck (person_name) · axial · 0.54mm/px · z∈[-669,-551]mm · 2 of 178 slices shown]
[im 60/178  soft-tissue]
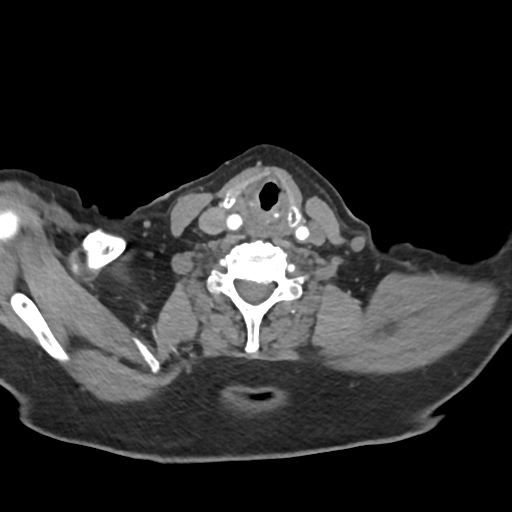
[im 119/178  soft-tissue]
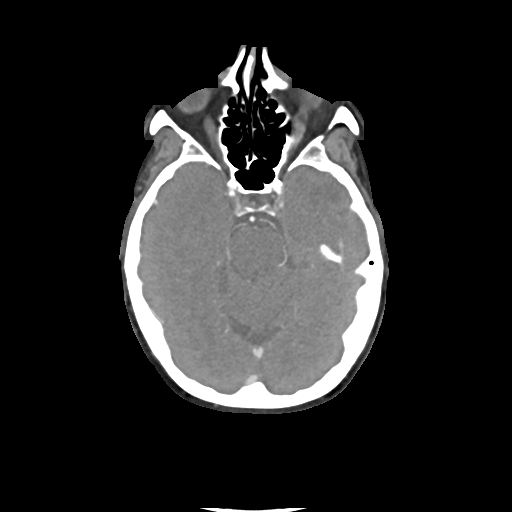

[Series 7: cta neck axial · axial · 0.39mm/px · z∈[-737,-489]mm · 6 of 348 slices shown]
[im 50/348  soft-tissue]
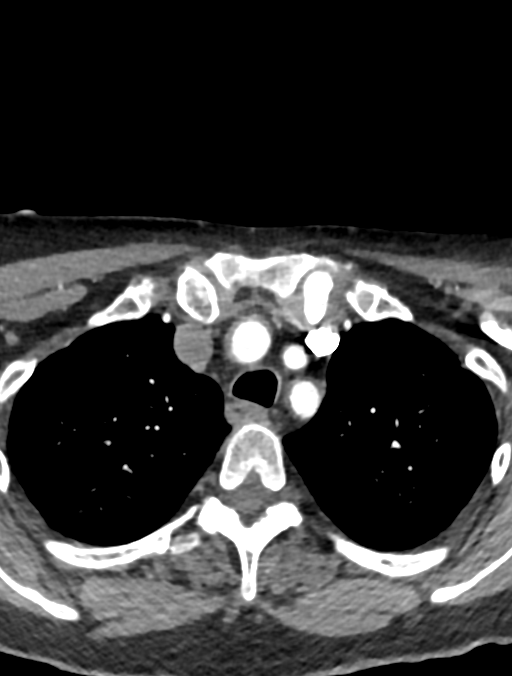
[im 100/348  bone]
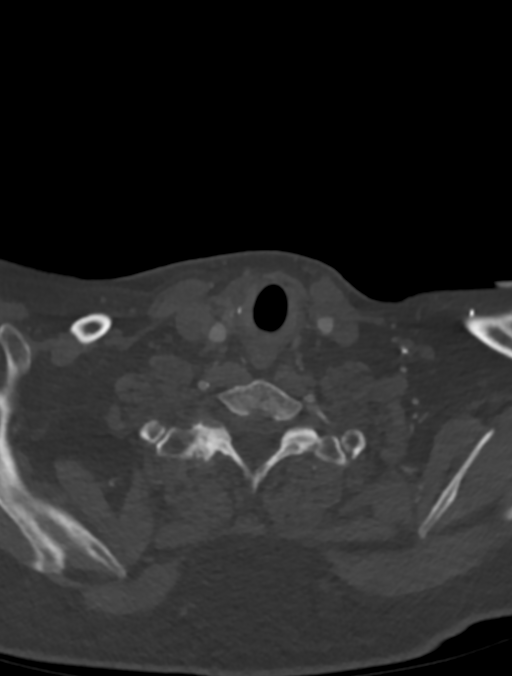
[im 149/348  soft-tissue]
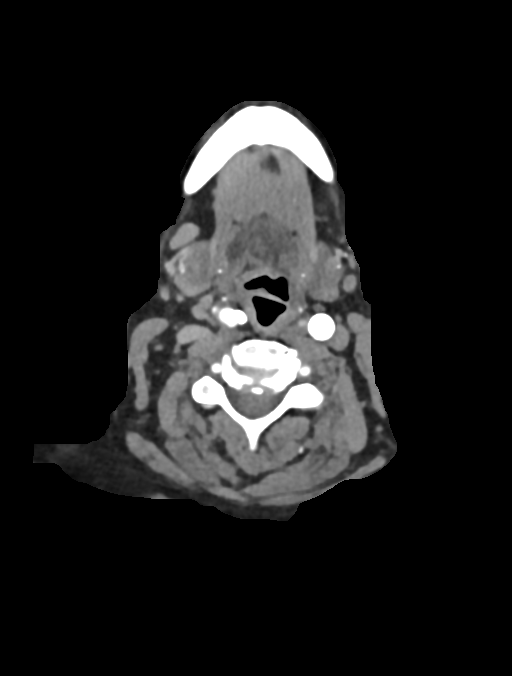
[im 199/348  bone]
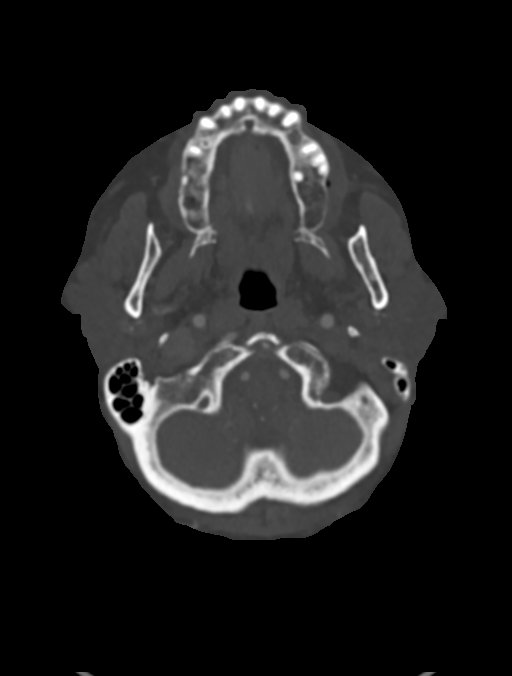
[im 248/348  soft-tissue]
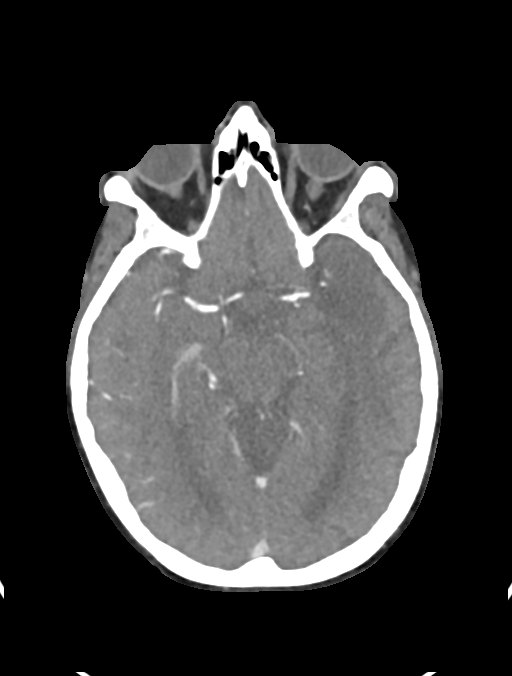
[im 298/348  bone]
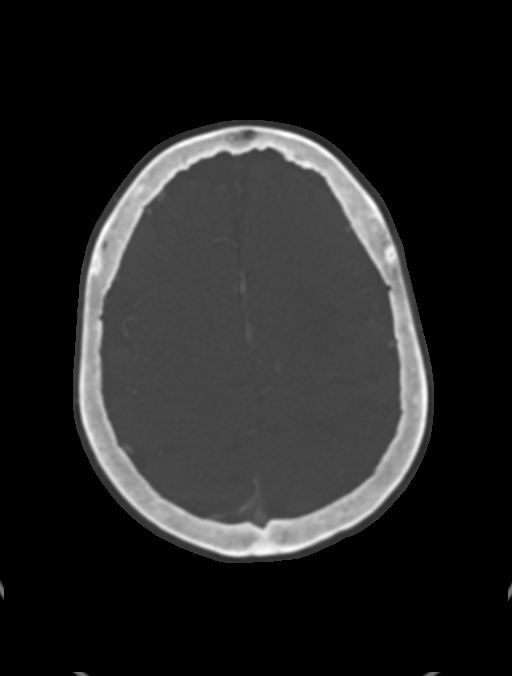

[8 of 33 positions shown; findings below may reference images not displayed]

FINDINGS: CTA NECK FINDINGS

Aortic arch: Visualized aortic arch of normal caliber with normal 3
vessel morphology. Mild atheromatous change about the arch and
origin of the great vessels without hemodynamically significant
stenosis.

Right carotid system: Right CCA regular but patent to the
bifurcation without flow-limiting stenosis. Bulky calcified plaque
about the right carotid bulb/proximal right ICA with associated
stenosis of up to approximate 50% by NASCET criteria. Right ICA
patent distally to the skull base without stenosis, dissection or
occlusion.

Left carotid system: Left CCA patent from its origin to the
bifurcation. Vascular stent traverses the left carotid bifurcation
and extends into the proximal left ICA. Widely patent flow through
the stent. Left ICA widely patent distally without stenosis,
evidence for dissection or occlusion.

Vertebral arteries: Both vertebral arteries arise from the
subclavian arteries. Vertebral arteries patent within the neck
without stenosis, dissection or occlusion.

Skeleton: No acute osseous finding. No discrete or worrisome osseous
lesions. Congenital fusion of the C6 and C7 vertebral bodies noted.
Moderate spondylosis noted elsewhere within the cervical spine
without high-grade stenosis.

Other neck: No other acute soft tissue abnormality within the neck.
No mass or adenopathy.

Upper chest: Visualized upper chest demonstrates no acute finding.

Review of the MIP images confirms the above findings

CTA HEAD FINDINGS

Anterior circulation: Petrous segments patent bilaterally. Scattered
atheromatous plaque throughout the carotid siphons with associated
mild to moderate multifocal narrowing. A1 segments patent
bilaterally. Normal anterior communicating artery complex. Anterior
cerebral arteries patent to their distal aspects without high-grade
stenosis. No M1 stenosis or occlusion. Negative MCA bifurcations. No
visible proximal MCA branch occlusion. Distal left MCA branches
somewhat small and atrophic in appearance related to the chronic
left MCA distribution infarct. Diffuse small vessel atheromatous
irregularity.

Posterior circulation: Both V4 segments patent to the
vertebrobasilar junction without high-grade stenosis. Both PICA
origins patent and normal. Basilar irregular but patent to its
distal aspect without significant stenosis. Superior cerebellar
arteries patent bilaterally. Right PCA supplied via the basilar.
Predominant fetal type origin of the left PCA. Both PCAs remain
patent to their distal aspects without stenosis.

Venous sinuses: Grossly patent allowing for timing the contrast
bolus.

Anatomic variants: Predominant fetal type origin of the left PCA. No
aneurysm.

Review of the MIP images confirms the above findings
IMPRESSION: 1. Negative CTA for emergent large vessel occlusion.
2. 50% atheromatous stenosis at the origin of the right ICA.
3. Widely patent left carotid stent.
4. Moderate intracranial atherosclerotic disease without proximal
high-grade or correctable stenosis.

Critical Value/emergent results were called by telephone at the time
of interpretation on [DATE] at [DATE] to provider BERTAIN
BERTAIN , who verbally acknowledged these results.

## 2020-04-28 MED ORDER — SODIUM CHLORIDE 0.9 % IV SOLN: INTRAVENOUS | Status: DC

## 2020-04-28 MED ORDER — LEVETIRACETAM IN NACL 1500 MG/100ML IV SOLN
1500.0000 mg | Freq: Once | INTRAVENOUS | Status: AC
  Administered 2020-04-28: 1500 mg via INTRAVENOUS
  Filled 2020-04-28: qty 100

## 2020-04-28 MED ORDER — ACETAMINOPHEN 160 MG/5ML PO SOLN: 650.0000 mg | ORAL | Status: DC | PRN

## 2020-04-28 MED ORDER — ACETAMINOPHEN 650 MG RE SUPP: 650.0000 mg | RECTAL | Status: DC | PRN

## 2020-04-28 MED ORDER — SODIUM CHLORIDE 0.9 % IV SOLN
50.0000 mL | Freq: Once | INTRAVENOUS | Status: AC
  Administered 2020-04-28: 50 mL via INTRAVENOUS

## 2020-04-28 MED ORDER — PANTOPRAZOLE SODIUM 40 MG IV SOLR
40.0000 mg | Freq: Every day | INTRAVENOUS | Status: DC
  Administered 2020-04-28 – 2020-04-29 (×2): 40 mg via INTRAVENOUS
  Filled 2020-04-28 (×2): qty 40

## 2020-04-28 MED ORDER — ATORVASTATIN CALCIUM 10 MG PO TABS
20.0000 mg | ORAL_TABLET | Freq: Every day | ORAL | Status: DC
  Administered 2020-04-29 – 2020-05-02 (×4): 20 mg via ORAL
  Filled 2020-04-28 (×5): qty 2

## 2020-04-28 MED ORDER — ALTEPLASE (STROKE) FULL DOSE INFUSION
0.9000 mg/kg | Freq: Once | INTRAVENOUS | Status: AC
  Administered 2020-04-28: 67.1 mg via INTRAVENOUS
  Filled 2020-04-28: qty 100

## 2020-04-28 MED ORDER — INSULIN ASPART 100 UNIT/ML ~~LOC~~ SOLN
0.0000 [IU] | Freq: Three times a day (TID) | SUBCUTANEOUS | Status: DC
  Administered 2020-04-29: 2 [IU] via SUBCUTANEOUS
  Administered 2020-04-30: 1 [IU] via SUBCUTANEOUS
  Administered 2020-04-30: 3 [IU] via SUBCUTANEOUS
  Administered 2020-05-01: 1 [IU] via SUBCUTANEOUS
  Administered 2020-05-01: 2 [IU] via SUBCUTANEOUS

## 2020-04-28 MED ORDER — SENNOSIDES-DOCUSATE SODIUM 8.6-50 MG PO TABS: 1.0000 | ORAL_TABLET | Freq: Every evening | ORAL | Status: DC | PRN

## 2020-04-28 MED ORDER — IOHEXOL 350 MG/ML SOLN
75.0000 mL | Freq: Once | INTRAVENOUS | Status: AC | PRN
  Administered 2020-04-28: 75 mL via INTRAVENOUS

## 2020-04-28 MED ORDER — STROKE: EARLY STAGES OF RECOVERY BOOK
Freq: Once | Status: AC
  Filled 2020-04-28: qty 1

## 2020-04-28 MED ORDER — ACETAMINOPHEN 325 MG PO TABS: 650.0000 mg | ORAL_TABLET | ORAL | Status: DC | PRN

## 2020-04-28 NOTE — H&P (Addendum)
NEUROLOGY CONSULTATION NOTE   Date of service: April 28, 2020 Patient Name: Lisa [name] MRN:  [id number] DOB:  [birth year] Reason for consult: "Stroke code" _ _ _   _ __   _ __ _ _  __ __   _ __   __ _  History of Present Illness  Lisa [name] is a 70 y.o. 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  She and her husband just finished dinner and she walked to the bedroom to change. Was walking to the bathroom, when husband heard a thud. Found her on the floor and she was out for 10 secs. Noticed that her facial droop was worse, she could not walk and speech was very off. Called EMS and she was brought in as a stroke code. Symptoms were improving and she was talking more and more and R Leg weakness was getting better but was still weak and could not coordinate her R leg and had mild aphasia. She was seen by Dr. Leonie Man in clinic and per exam was walking with no gait abnormality.  NIHSS components Score: Comment  1a Level of Conscious 0[x]  1[]  2[]  3[]      1b LOC Questions 0[]  1[]  2[x]       1c LOC Commands 0[x]  1[]  2[]       2 Best Gaze 0[x]  1[]  2[]       3 Visual 0[x]  1[]  2[]  3[]      4 Facial Palsy 0[]  1[]  2[x]  3[]      5a Motor Arm - left 0[x]  1[]  2[]  3[]  4[]  UN[]    5b Motor Arm - Right 0[x]  1[]  2[]  3[]  4[]  UN[]    6a Motor Leg - Left 0[x]  1[]  2[]  3[]  4[]  UN[]    6b Motor Leg - Right 0[]  1[x]  2[]  3[]  4[]  UN[]    7 Limb Ataxia 0[]  1[]  2[x]  3[]  UN[]     8 Sensory 0[x]  1[]  2[]  UN[]      9 Best Language 0[]  1[x]  2[]  3[]      10 Dysarthria 0[]  1[x]  2[]  UN[]      11 Extinct. and Inattention 0[x]  1[]  2[]       TOTAL: 9   MRS:1 TPA: discussed 30% chance of improvement in symptoms and 6% chance of  ICH. If she has bleeding due to her history of bladder cancer, can always do supportive blood transfusion until tPA wears off. Husband reports that patient would have wanted everything for herself and agreed to tPA. tPA given at 2051 Thrombectomy: No LVO.   ROS  Unable to obtain due to aphasia. Past History   Past Medical History:  Diagnosis Date  . Anemia   . Cancer ([place])   . Carotid arterial disease ([place]) 07/2019  . Diabetes ([place])   . HTN (hypertension)   . Hyperlipidemia   . Stroke [facility])    acute/subacute left MCA infarct 08/12/19   Past Surgical History:  Procedure Laterality Date  . CYSTOSCOPY WITH FULGERATION N/A 09/01/2019   Procedure: CYSTOSCOPY CLOT EVACUATION WITH POSSIBLE FULGERATION;  Surgeon: Robley Fries, MD;  Location: [facility];  Service: Urology;  Laterality: N/A;  . CYSTOSCOPY WITH FULGERATION N/A 09/28/2019   Procedure: [place] AND CLOT EVACUATION;  Surgeon:  Robley Fries, MD;  Location: [facility];  Service: Urology;  Laterality: N/A;  . IR IVC FILTER PLMT / S&I [facility] GUID/MOD SED  10/07/2019  . IR RADIOLOGIST EVAL & MGMT  03/22/2020  . TONSILLECTOMY    . TRANSCAROTID ARTERY REVASCULARIZATION Left 08/20/2019   Procedure: LEFT TRANSCAROTID ARTERY REVASCULARIZATION;  Surgeon: Waynetta Sandy, MD;  Location: [facility];  Service: Vascular;  Laterality: Left;  . TRANSURETHRAL RESECTION OF BLADDER TUMOR WITH MITOMYCIN-C N/A 09/22/2019   Procedure: TRANSURETHRAL RESECTION OF BLADDER TUMOR WITH INTRAVESICAL GEMCITABINE;  Surgeon: Robley Fries, MD;  Location: [facility];  Service: Urology;  Laterality: N/A;  53 MINS   Family History  Problem Relation Age of Onset  . Hypertension Mother   . Lung cancer Father    Social History   Socioeconomic History  . Marital status: Married    Spouse name: Not on file  . Number of children: Not on file  . Years of education: Not on file  . Highest education level: Not on file  Occupational History  . Not  on file  Tobacco Use  . Smoking status: Former Smoker    Packs/day: 0.50    Types: Cigarettes    Quit date: 07/28/2019    Years since quitting: 0.7  . Smokeless tobacco: Never Used  Vaping Use  . Vaping Use: Never used  Substance and Sexual Activity  . Alcohol use: Not Currently  . Drug use: Never  . Sexual activity: Yes  Other Topics Concern  . Not on file  Social History Narrative   Lives Independent Living with husband, Demetrius Charity in [place].    Left Handed   Drinks no caffeine   Social Determinants of Health   Financial Resource Strain: Not on file  Food Insecurity: Not on file  Transportation Needs: Not on file  Physical Activity: Not on file  Stress: Not on file  Social Connections: Not on file   No Known Allergies  Medications  (Not in a hospital admission)    Vitals   Vitals:   04/28/20 2000  Weight: 74.6 kg     Body mass index is 26.55 kg/m.  Physical Exam   General: Laying comfortably in bed; in no acute distress. HENT: Normal oropharynx and mucosa. Normal external appearance of ears and nose. Neck: Supple, no pain or tenderness CV: No JVD. No peripheral edema. Pulmonary: Symmetric Chest rise. Normal respiratory effort. Abdomen: Soft to touch, non-tender. Ext: No cyanosis, edema, or deformity Skin: No rash. Normal palpation of skin.  Musculoskeletal: Normal digits and nails by inspection. No clubbing.  Neurologic Examination  Mental status/Cognition: Alert, oriented to self, but not to place, month and year. Speech/language: Non fluent, dysarthric speech. Getting stuck on several words, comprehension intact to simple commands, only able to name a couple objects. Unable to repeat. Cranial nerves:   CN II Pupils equal and reactive to light, no VF deficits   CN III,IV,VI EOM intact, no gaze preference or deviation, no nystagmus   CN V normal sensation in V1, V2, and V3 segments bilaterally   CN VII R facial droop.   CN VIII normal hearing to  speech   CN IX & X normal palatal elevation, no uvular deviation   CN XI    CN XII midline tongue protrusion   Motor:  Muscle bulk: poor, tone normal, pronator drift none tremor none Mvmt Root Nerve  Muscle Right Left Comments  SA C5/6 Ax Deltoid     EF C5/6 Mc Biceps  5 5   EE C6/7/8 Rad Triceps 5 5   WF C6/7 Med FCR     WE C7/8 PIN ECU     F Ab C8/T1 U ADM/FDI 4+ 5 Poor coordination with RUE  HF L1/2/3 Fem Illopsoas 5 5 R Lower extremity poorly coordinated with significant ataxia.  KE L2/3/4 Fem Quad 5 5   DF L4/5 D Peron Tib Ant 5 5   PF S1/2 Tibial Grc/Sol 5 5    Reflexes:  Right Left Comments  Pectoralis      Biceps (C5/6) 2 2   Brachioradialis (C5/6) 2 2    Triceps (C6/7) 2 2    Patellar (L3/4) 2 2    Achilles (S1)      Hoffman      Plantar     Jaw jerk    Sensation:  Light touch Intact throughout   Pin prick    Temperature    Vibration   Proprioception    Coordination/Complex Motor:  - Finger to Nose with RUE ataxia. - Heel to shin with RLE ataxia. - Gait: Deferred.  Labs   CBC:  Recent Labs  Lab 04/28/20 2033 04/28/20 2038  WBC 9.8  --   NEUTROABS 5.5  --   HGB 12.2 12.9  HCT 39.7 38.0  MCV 88.8  --   PLT 236  --     Basic Metabolic Panel:  Lab Results  Component Value Date   NA 138 04/28/2020   K 3.8 04/28/2020   CO2 27 01/06/2020   GLUCOSE 129 (H) 04/28/2020   BUN 17 04/28/2020   CREATININE 0.90 04/28/2020   CALCIUM 9.6 01/06/2020   GFRNONAA >60 10/18/2019   GFRAA >60 10/18/2019   Lipid Panel:  Lab Results  Component Value Date   LDLCALC 63 01/06/2020   HgbA1c:  Lab Results  Component Value Date   HGBA1C 5.9 01/06/2020   Urine Drug Screen:     Component Value Date/Time   LABOPIA NONE DETECTED 08/13/2019 1822   COCAINSCRNUR NONE DETECTED 08/13/2019 1822   LABBENZ NONE DETECTED 08/13/2019 1822   AMPHETMU NONE DETECTED 08/13/2019 1822   THCU POSITIVE (A) 08/13/2019 1822   LABBARB NONE DETECTED 08/13/2019 1822    Alcohol  Level No results found for: [place]  CT Head without contrast: CTH notable for old L MCA stroke. No new area of hypodensity concerning for a large territory infarct. ASPECTS of 10.  CT angio Head and Neck with contrast: No LVO. L ICA Stent appears patent on my read.  MRI Brain pending  rEEG: pending.  Impression   Lisa [name] is a 70 y.o. 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 Given significant R leg incoordination, tPA was offered after discussion of risks and benefits with husband. Not a candidate for thrombectomy. Will also get a routine EEG and load with Keppra given she had a ?episode of unresponsiveness for 10 secs. Was not post ictal on arrival to the ED thou.  Given her hx of bladder cancer and prior hx of bleeding in July 2021, will admit to ICU and monitor H and H Q6 hours.  Recommendations   Stroke vs seizure: - Frequent NeuroChecks for post tPA care per stroke unit protocol: - Initial CTH demonstrated no acute hemorrhage or  mass - MRI Brain - pending - CTA - no LVO - TTE - pending - Lipid Panel: LDL - pending  - Statin: continue home statin for now. - HbA1c: pending - Antithrombotic: Start ASA 81 mg daily if 24 h CTH does not show acute hemorrhage - DVT prophylaxis: SCDs. Pharmacologic prophylaxis if 24 h CTH does not demonstrate acute hemorrhage - Systolic Blood Pressure goal: < 180 mm Hg with DBP < 105. - Telemetry monitoring for arrhythmia: 72 hours - Swallow screen - ordered - PT/OT/SLP consults - rEEG - Keppra 1500mg  IV load once given.   DM2: - SSI  HTN:  PRN and blood pressure goal as above.  HLD: - continue home atorvastatin 20mg  daily.  Hx of bladder cancer and bleeding in the past. - Monitor H and H every 6 hours for 24  hours.  ______________________________________________________________________  This patient is critically ill and at significant risk of neurological worsening, death and care requires constant monitoring of vital signs, hemodynamics,respiratory and cardiac monitoring, neurological assessment, discussion with family, other specialists and medical decision making of high complexity. I spent 40 minutes of neurocritical care time  in the care of  this patient. This was time spent independent of any time provided by nurse practitioner or PA.  Donnetta Simpers [facility] Pager Number [phone] 04/28/2020  9:58 PM   Thank you for the opportunity to take part in the care of this patient. If you have any further questions, please contact the neurology consultation attending.  Signed,  [place] Pager Number [phone] _ _ _   _ __   _ __ _ _  __ __   _ __   __ _

## 2020-04-28 NOTE — Progress Notes (Signed)
Pharmacist Code Stroke Response  Notified to mix tPA at 2041 by Dr. Janeann Forehand Delivered tPA to RN at 2050  tPA dose = 6.7mg  bolus over 1 minute followed by 60.4mg  for a total dose of 67.1mg  over 1 hour  Issues/delays encountered (if applicable): Obtaining patient weight as initial stroke bed was dysfunctional   Albertina Parr, PharmD., BCPS, BCCCP Clinical Pharmacist Please refer to [facility] for unit-specific pharmacist

## 2020-04-28 NOTE — Code Documentation (Signed)
Stroke Response Nurse Documentation Code Documentation  Lisa [name] is a 70 y.o. female arriving to [facility] ED via [facility] on 3/17 with past medical hx of Left MCA CVA, Left ICA stent, HLD, HTN. Code stroke was activated by EMS. Patient from home where she was LKW at [place] and now complaining of aphasia and worsened right weakness . On aspirin 81 mg daily. Stroke team at the bedside on patient arrival. Labs drawn and patient cleared for CT by Dr. Sherry Ruffing. Patient to CT with team. NIHSS 9, see documentation for details and code stroke times. Patient with disoriented, right facial droop, right leg weakness, right limb ataxia, Global aphasia  and dysarthria  on exam. The following imaging was completed:  CTA head and neck. Patient is a candidate for tPA due to fixed deficit. Bedside handoff with ED RN Autumn.    Madelynn Done  Rapid Response RN

## 2020-04-28 NOTE — ED Notes (Signed)
Report called to [facility]N

## 2020-04-28 NOTE — ED Triage Notes (Addendum)
Pt arrived via [facility] from home, per husband he heard a thud from bedroom @ 1930 he reports pt was unresponsive with eyes closed for several seconds. Slowly became conscious, resistive to care by husband, speech slurred, R sided facial droop. EMS reports no movement to R side, pt not following directions.  #20 L FA est by EMS.

## 2020-04-28 NOTE — ED Notes (Signed)
TPA  Completed, post infusion saline started

## 2020-04-28 NOTE — ED Notes (Signed)
uanble to call report, unsure if pt will be going to 4N or another unit.

## 2020-04-28 NOTE — ED Provider Notes (Signed)
[facility] EMERGENCY DEPARTMENT Provider Note   CSN: [id number] Arrival date & time: 04/28/20  2026     History Chief Complaint  Patient presents with  . Code Stroke    Lisa [name] is a 70 y.o. female.  The history is provided by the patient and medical records. No language interpreter was used.  Cerebrovascular Accident This is a new problem. The problem occurs constantly. The problem has been gradually improving. Pertinent negatives include no chest pain, no abdominal pain, no headaches and no shortness of breath. Nothing aggravates the symptoms. Nothing relieves the symptoms. She has tried nothing for the symptoms. The treatment provided no relief.       Past Medical History:  Diagnosis Date  . Anemia   . Cancer ([place])   . Carotid arterial disease ([place]) 07/2019  . Diabetes ([place])   . HTN (hypertension)   . Hyperlipidemia   . Stroke [facility])    acute/subacute left MCA infarct 08/12/19    Patient Active Problem List   Diagnosis Date Noted  . Stroke (cerebrum) ([place]) 04/28/2020  . Lower leg DVT (deep venous thromboembolism), acute, bilateral ([place])   . Acute deep vein thrombosis (DVT) of proximal vein of both lower extremities (HCC)   . Symptomatic anemia   . Weakness generalized   . Aphasia   . Palliative care by specialist   . DNR (do not resuscitate) discussion   . 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 Lacunar infarct, acute ([place]) 09/17/2019  . Acute blood loss anemia 09/17/2019  . Pressure injury of skin 09/07/2019  . Gross hematuria 09/01/2019  . Hematuria 08/31/2019  . 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 Stenosis of carotid artery   . Abnormality of aortic valve   . DM (diabetes mellitus), type 2, uncontrolled w/neurologic complication ([place])   . Hypercholesteremia   . CVA (cerebral vascular accident) ([place]) 08/12/2019  . Acute ischemic stroke ([place])   . Hypertension      Past Surgical History:  Procedure Laterality Date  . CYSTOSCOPY WITH FULGERATION N/A 09/01/2019   Procedure: CYSTOSCOPY CLOT EVACUATION WITH POSSIBLE FULGERATION;  Surgeon: Robley Fries, MD;  Location: [place];  Service: Urology;  Laterality: N/A;  . CYSTOSCOPY WITH FULGERATION N/A 09/28/2019   Procedure: [place] AND CLOT EVACUATION;  Surgeon: Robley Fries, MD;  Location: [place];  Service: Urology;  Laterality: N/A;  . IR IVC FILTER PLMT / S&I [place] Keels GUID/MOD SED  10/07/2019  . IR RADIOLOGIST EVAL & MGMT  03/22/2020  . TONSILLECTOMY    . TRANSCAROTID ARTERY REVASCULARIZATION Left 08/20/2019   Procedure: LEFT TRANSCAROTID ARTERY REVASCULARIZATION;  Surgeon: Waynetta Sandy, MD;  Location: [place];  Service: Vascular;  Laterality: Left;  . TRANSURETHRAL RESECTION OF BLADDER TUMOR WITH MITOMYCIN-C N/A 09/22/2019   Procedure: TRANSURETHRAL RESECTION OF BLADDER TUMOR WITH INTRAVESICAL GEMCITABINE;  Surgeon: Robley Fries, MD;  Location: [facility];  Service: Urology;  Laterality: N/A;  83 MINS     OB History   No obstetric history on file.     Family History  Problem Relation Age of Onset  . Hypertension Mother   . Lung cancer Father     Social History   Tobacco Use  . Smoking status: Former Smoker    Packs/day: 0.50    Types: Cigarettes    Quit date: 07/28/2019    Years since quitting: 0.7  . Smokeless tobacco: Never Used  Vaping  Use  . Vaping Use: Never used  Substance Use Topics  . Alcohol use: Not Currently  . Drug use: Never    Home Medications Prior to Admission medications   Medication Sig Start Date End Date Taking? Authorizing Provider  amLODipine (NORVASC) 10 MG tablet TAKE 1 TABLET ONCE DAILY. Patient taking differently: Take 10 mg by mouth daily. 04/01/20   Dutch Quint B, FNP  aspirin 81 MG EC tablet Take 1 tablet (81 mg total) by mouth daily. Swallow whole. 03/01/20   Brunetta Jeans, PA-C  atorvastatin (LIPITOR) 20 MG tablet  Take 1 tablet (20 mg total) by mouth daily. 03/01/20   Brunetta Jeans, PA-C  docusate sodium (COLACE) 100 MG capsule Take 1 capsule (100 mg total) by mouth daily. 03/01/20   Brunetta Jeans, PA-C  losartan (COZAAR) 25 MG tablet Take 1 tablet (25 mg total) by mouth in the morning. 03/01/20   Brunetta Jeans, PA-C  Melatonin 10 MG TABS Take 10 mg by mouth at bedtime. Patient not taking: Reported on 04/27/2020 03/01/20   Brunetta Jeans, PA-C  metFORMIN (GLUCOPHAGE XR) 500 MG 24 hr tablet Take 1 tablet (500 mg total) by mouth daily with breakfast. 03/01/20   Brunetta Jeans, PA-C  Multiple Vitamin tablet Take 1 tablet by mouth daily.     [provider]  polyethylene glycol (MIRALAX / GLYCOLAX) 17 g packet Take 17 g by mouth daily as needed for mild constipation or moderate constipation. Patient not taking: Reported on 04/27/2020 03/01/20   Brunetta Jeans, PA-C  traZODone (DESYREL) 50 MG tablet Take 1 tablet (50 mg total) by mouth at bedtime. 03/01/20   Brunetta Jeans, PA-C    Allergies    Patient has no known allergies.  Review of Systems   Review of Systems  Unable to perform ROS: Other (acute code stroke and aphasia)  Constitutional: Negative for chills, fatigue and fever.  Eyes: Negative for visual disturbance.  Respiratory: Negative for shortness of breath.   Cardiovascular: Negative for chest pain.  Gastrointestinal: Negative for abdominal pain.  Genitourinary: Negative for flank pain.  Neurological: Positive for facial asymmetry, speech difficulty and weakness. Negative for dizziness, light-headedness, numbness and headaches.  Psychiatric/Behavioral: Negative for agitation.    Physical Exam Updated Vital Signs BP (!) 148/76 (BP Location: Right Arm)   Pulse (!) 57   Temp 97.9 F (36.6 C) (Oral)   Resp 18   Wt 74.6 kg   SpO2 99%   BMI 26.55 kg/m   Physical Exam Vitals reviewed.  Constitutional:      General: She is not in acute distress.    Appearance:  Normal appearance. She is not ill-appearing, toxic-appearing or diaphoretic.  HENT:     Head: Normocephalic and atraumatic.     Nose: No congestion or rhinorrhea.     Mouth/Throat:     Mouth: Mucous membranes are moist.     Pharynx: No oropharyngeal exudate or posterior oropharyngeal erythema.  Eyes:     Extraocular Movements: Extraocular movements intact.     Pupils: Pupils are equal, round, and reactive to light.  Cardiovascular:     Rate and Rhythm: Normal rate.     Pulses: Normal pulses.     Heart sounds: No murmur heard.   Pulmonary:     Effort: Pulmonary effort is normal.     Breath sounds: No wheezing, rhonchi or rales.  Chest:     Chest wall: No tenderness.  Abdominal:  General: Abdomen is flat.     Tenderness: There is no abdominal tenderness.  Musculoskeletal:        General: No tenderness.     Cervical back: No tenderness.  Skin:    Capillary Refill: Capillary refill takes less than 2 seconds.     Findings: No erythema.  Neurological:     Mental Status: She is alert.     Cranial Nerves: Dysarthria and facial asymmetry present.     Sensory: No sensory deficit.     Motor: Weakness present.     Coordination: Coordination normal.     Comments: Aphasia.   Patient has normal sensation throughout.  Patient is very difficult time answering questions and focusing and speaking.  She repeats things.  Symmetric smile on reassessment.  Pupil symmetric and reactive normal extraocular movements.  Psychiatric:        Mood and Affect: Mood normal.     ED Results / Procedures / Treatments   Labs (all labs ordered are listed, but only abnormal results are displayed) Labs Reviewed  ETHANOL - Abnormal; Notable for the following components:      Result Value   Alcohol, Ethyl (B) 147 (*)    All other components within normal limits  COMPREHENSIVE METABOLIC PANEL - Abnormal; Notable for the following components:   Glucose, Bld 131 (*)    All other components within normal  limits  I-STAT CHEM 8, ED - Abnormal; Notable for the following components:   Glucose, Bld 129 (*)    All other components within normal limits  CBG MONITORING, ED - Abnormal; Notable for the following components:   Glucose-Capillary 127 (*)    All other components within normal limits  RESP PANEL BY RT-PCR (FLU A&B, COVID) ARPGX2  PROTIME-INR  APTT  CBC  DIFFERENTIAL  RAPID URINE DRUG SCREEN, HOSP PERFORMED  URINALYSIS, ROUTINE W REFLEX MICROSCOPIC  HEMOGLOBIN A1C  LIPID PANEL  HEMOGLOBIN AND HEMATOCRIT, BLOOD  HEMOGLOBIN AND HEMATOCRIT, BLOOD  HEMOGLOBIN AND HEMATOCRIT, BLOOD    EKG EKG Interpretation  Date/Time:  Thursday April 28 2020 21:12:03 EDT Ventricular Rate:  81 PR Interval:    QRS Duration: 93 QT Interval:  386 QTC Calculation: 448 R Axis:   -36 Text Interpretation: Sinus rhythm Left axis deviation Anterior infarct, old When compared to prior, more irregularity. No STEMI Confirmed by Antony Blackbird [phone]) on 04/28/2020 9:26:28 PM   Radiology CT HEAD CODE STROKE WO CONTRAST  Result Date: 04/28/2020 CLINICAL DATA:  Code stroke. Initial evaluation for acute right-sided weakness with facial droop. Stroke 6 EXAM: CT HEAD WITHOUT CONTRAST TECHNIQUE: Contiguous axial images were obtained from the base of the skull through the vertex without intravenous contrast. COMPARISON:  Prior CT and MRI from 09/17/2019. FINDINGS: Brain: Generalized age-related cerebral atrophy with chronic small vessel ischemic disease. Extensive encephalomalacia throughout the left cerebral hemisphere, consistent with a chronic left MCA distribution infarct. Small focus of mild hyperdensity at the anterior margin of the chronic ischemic infarct favored to be most consistent with a small amount of residual laminar necrosis (series 4, image 22). Wallerian degeneration noted at the left cerebral peduncle and midbrain. No other evidence for acute intracranial hemorrhage. No other acute large vessel  territory infarct. No mass lesion, midline shift or mass effect. No hydrocephalus or extra-axial fluid collection. Vascular: No hyperdense vessel. Calcified atherosclerosis at the skull base. Skull: Scalp soft tissues and calvarium within normal limits. Sinuses/Orbits: Right gaze noted. Globes and orbital soft tissues demonstrate no other acute finding. Paranasal  sinuses are clear. No mastoid effusion. Other: None. ASPECTS [facility] Stroke Program Early CT Score) - Ganglionic level infarction (caudate, lentiform nuclei, internal capsule, insula, M1-M3 cortex): 7 - Supraganglionic infarction (M4-M6 cortex): 3 Total score (0-10 with 10 being normal): 10 IMPRESSION: 1. No acute intracranial infarct or other abnormality. 2. ASPECTS is 10. 3. Large remote left MCA distribution infarct. Small focus of hyperdensity along the anterior margin of the chronic ischemic infarct favored to reflect a small amount of residual laminar necrosis. These results were communicated to Dr. Lorrin Goodell at 8:48 pmon 3/17/2022by text page via the [facility] messaging system. Electronically Signed   By: Jeannine Boga M.D.   On: 04/28/2020 20:51   CT ANGIO HEAD CODE STROKE  Result Date: 04/28/2020 CLINICAL DATA:  Initial evaluation for acute stroke, right-sided leg weakness. EXAM: CT ANGIOGRAPHY HEAD AND NECK TECHNIQUE: Multidetector CT imaging of the head and neck was performed using the standard protocol during bolus administration of intravenous contrast. Multiplanar CT image reconstructions and MIPs were obtained to evaluate the vascular anatomy. Carotid stenosis measurements (when applicable) are obtained utilizing NASCET criteria, using the distal internal carotid diameter as the denominator. CONTRAST:  20mL OMNIPAQUE IOHEXOL 350 MG/ML SOLN COMPARISON:  Prior head CT from earlier the same day. FINDINGS: CTA NECK FINDINGS Aortic arch: Visualized aortic arch of normal caliber with normal 3 vessel morphology. Mild atheromatous change  about the arch and origin of the great vessels without hemodynamically significant stenosis. Right carotid system: Right CCA regular but patent to the bifurcation without flow-limiting stenosis. Bulky calcified plaque about the right carotid bulb/proximal right ICA with associated stenosis of up to approximate 50% by NASCET criteria. Right ICA patent distally to the skull base without stenosis, dissection or occlusion. Left carotid system: Left CCA patent from its origin to the bifurcation. Vascular stent traverses the left carotid bifurcation and extends into the proximal left ICA. Widely patent flow through the stent. Left ICA widely patent distally without stenosis, evidence for dissection or occlusion. Vertebral arteries: Both vertebral arteries arise from the subclavian arteries. Vertebral arteries patent within the neck without stenosis, dissection or occlusion. Skeleton: No acute osseous finding. No discrete or worrisome osseous lesions. 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 Moderate spondylosis noted elsewhere within the cervical spine without high-grade stenosis. Other neck: No other acute soft tissue abnormality within the neck. No mass or adenopathy. Upper chest: Visualized upper chest demonstrates no acute finding. Review of the MIP images confirms the above findings CTA HEAD FINDINGS Anterior circulation: Petrous segments patent bilaterally. Scattered atheromatous plaque throughout the carotid siphons with associated mild to moderate multifocal narrowing. A1 segments patent bilaterally. Normal anterior communicating artery complex. Anterior cerebral arteries patent to their distal aspects without high-grade stenosis. No M1 stenosis or occlusion. Negative MCA bifurcations. No visible proximal MCA branch occlusion. Distal left MCA branches somewhat small and atrophic in appearance related to the chronic left MCA distribution infarct. Diffuse small vessel atheromatous irregularity.  Posterior circulation: Both V4 segments patent to the vertebrobasilar junction without high-grade stenosis. Both PICA origins patent and normal. Basilar irregular but patent to its distal aspect without significant stenosis. Superior cerebellar arteries patent bilaterally. Right PCA supplied via the basilar. Predominant fetal type origin of the left PCA. Both PCAs remain patent to their distal aspects without stenosis. Venous sinuses: Grossly patent allowing for timing the contrast bolus. Anatomic variants: Predominant fetal type origin of the left PCA. No aneurysm. Review of the MIP images confirms the above findings IMPRESSION:  1. Negative CTA for emergent large vessel occlusion. 2. 50% atheromatous stenosis at the origin of the right ICA. 3. Widely patent left carotid stent. 4. Moderate intracranial atherosclerotic disease without proximal high-grade or correctable stenosis. Critical Value/emergent results were called by telephone at the time of interpretation on 04/28/2020 at 8:58 pm to provider [facility] , who verbally acknowledged these results. Electronically Signed   By: Jeannine Boga M.D.   On: 04/28/2020 21:18   CT ANGIO NECK CODE STROKE  Result Date: 04/28/2020 CLINICAL DATA:  Initial evaluation for acute stroke, right-sided leg weakness. EXAM: CT ANGIOGRAPHY HEAD AND NECK TECHNIQUE: Multidetector CT imaging of the head and neck was performed using the standard protocol during bolus administration of intravenous contrast. Multiplanar CT image reconstructions and MIPs were obtained to evaluate the vascular anatomy. Carotid stenosis measurements (when applicable) are obtained utilizing NASCET criteria, using the distal internal carotid diameter as the denominator. CONTRAST:  36mL OMNIPAQUE IOHEXOL 350 MG/ML SOLN COMPARISON:  Prior head CT from earlier the same day. FINDINGS: CTA NECK FINDINGS Aortic arch: Visualized aortic arch of normal caliber with normal 3 vessel morphology. Mild  atheromatous change about the arch and origin of the great vessels without hemodynamically significant stenosis. Right carotid system: Right CCA regular but patent to the bifurcation without flow-limiting stenosis. Bulky calcified plaque about the right carotid bulb/proximal right ICA with associated stenosis of up to approximate 50% by NASCET criteria. Right ICA patent distally to the skull base without stenosis, dissection or occlusion. Left carotid system: Left CCA patent from its origin to the bifurcation. Vascular stent traverses the left carotid bifurcation and extends into the proximal left ICA. Widely patent flow through the stent. Left ICA widely patent distally without stenosis, evidence for dissection or occlusion. Vertebral arteries: Both vertebral arteries arise from the subclavian arteries. Vertebral arteries patent within the neck without stenosis, dissection or occlusion. Skeleton: No acute osseous finding. No discrete or worrisome osseous lesions. 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 Moderate spondylosis noted elsewhere within the cervical spine without high-grade stenosis. Other neck: No other acute soft tissue abnormality within the neck. No mass or adenopathy. Upper chest: Visualized upper chest demonstrates no acute finding. Review of the MIP images confirms the above findings CTA HEAD FINDINGS Anterior circulation: Petrous segments patent bilaterally. Scattered atheromatous plaque throughout the carotid siphons with associated mild to moderate multifocal narrowing. A1 segments patent bilaterally. Normal anterior communicating artery complex. Anterior cerebral arteries patent to their distal aspects without high-grade stenosis. No M1 stenosis or occlusion. Negative MCA bifurcations. No visible proximal MCA branch occlusion. Distal left MCA branches somewhat small and atrophic in appearance related to the chronic left MCA distribution infarct. Diffuse small vessel  atheromatous irregularity. Posterior circulation: Both V4 segments patent to the vertebrobasilar junction without high-grade stenosis. Both PICA origins patent and normal. Basilar irregular but patent to its distal aspect without significant stenosis. Superior cerebellar arteries patent bilaterally. Right PCA supplied via the basilar. Predominant fetal type origin of the left PCA. Both PCAs remain patent to their distal aspects without stenosis. Venous sinuses: Grossly patent allowing for timing the contrast bolus. Anatomic variants: Predominant fetal type origin of the left PCA. No aneurysm. Review of the MIP images confirms the above findings IMPRESSION: 1. Negative CTA for emergent large vessel occlusion. 2. 50% atheromatous stenosis at the origin of the right ICA. 3. Widely patent left carotid stent. 4. Moderate intracranial atherosclerotic disease without proximal high-grade or correctable stenosis. Critical Value/emergent results were  called by telephone at the time of interpretation on 04/28/2020 at 8:58 pm to provider [facility] , who verbally acknowledged these results. Electronically Signed   By: Jeannine Boga M.D.   On: 04/28/2020 21:18    Procedures Procedures   Medications Ordered in ED Medications  levETIRAcetam (KEPPRA) IVPB 1500 mg/ 100 mL premix (1,500 mg Intravenous New Bag/Given 04/28/20 2120)  alteplase (ACTIVASE) 1 mg/mL infusion 67.1 mg (67.1 mg Intravenous New Bag/Given 04/28/20 2051)    Followed by  0.9 %  sodium chloride infusion (has no administration in time range)   stroke: mapping our early stages of recovery book (has no administration in time range)  0.9 %  sodium chloride infusion (has no administration in time range)  acetaminophen (TYLENOL) tablet 650 mg (has no administration in time range)    Or  acetaminophen (TYLENOL) 160 MG/5ML solution 650 mg (has no administration in time range)    Or  acetaminophen (TYLENOL) suppository 650 mg (has no  administration in time range)  senna-docusate (Senokot-S) tablet 1 tablet (has no administration in time range)  pantoprazole (PROTONIX) injection 40 mg (has no administration in time range)  iohexol (OMNIPAQUE) 350 MG/ML injection 75 mL (75 mLs Intravenous Contrast Given 04/28/20 2047)    ED Course  I have reviewed the triage vital signs and the nursing notes.  Pertinent labs & imaging results that were available during my care of the patient were reviewed by me and considered in my medical decision making (see chart for details).    MDM Rules/Calculators/A&P                          KALEIA LONGHI is a 70 y.o. female with a past medical history significant for prior stroke, hypertension, diabetes, prior DVT, who presents as a code stroke.  According to EMS, patient was last normal at [place] and husband heard a thud falling on the ground.  Patient was on the carpet and there is no evidence of acute trauma but patient was having right-sided weakness in the right face, arm, and leg and difficulty with speaking.  Patient was activated as a code stroke during transport.  On arrival, patient airway appeared to be intact.  She was having aphasia but was able to answer some questions appropriately.  She quickly was taken to CT scanner for imaging.  On my further exam, lungs were clear and chest was nontender.  Abdomen was nontender.  She did have some weakness in right leg but right arm was similar.  Pupils are symmetric and reactive normal extraocular movements.  Speech was slightly aphasic.  CT scan shows old left-sided MCA infarct but did not see any acute bleeding.  Neurology made decision to give patient TPA and she will be admitted for further management of stroke.  Patient will be admitted to ICU for further management of stroke status post TPA.   Final Clinical Impression(s) / ED Diagnoses Final diagnoses:  Aphasia  Cerebrovascular accident (CVA), unspecified mechanism ([place])     Clinical Impression: 1. Aphasia   2. Cerebrovascular accident (CVA), unspecified mechanism ([place])     Disposition: Admit  This note was prepared with assistance of Dragon voice recognition software. Occasional wrong-word or sound-a-like substitutions may have occurred due to the inherent limitations of voice recognition software.       Anhelica Fowers, Gwenyth Allegra, MD 04/28/20 2211

## 2020-04-29 ENCOUNTER — Inpatient Hospital Stay (HOSPITAL_COMMUNITY): Payer: Medicare Other

## 2020-04-29 ENCOUNTER — Ambulatory Visit (HOSPITAL_COMMUNITY)
Admission: RE | Admit: 2020-04-29 | Discharge: 2020-04-29 | Disposition: A | Discharge status: Home / Self Care | Payer: Medicare Other | Source: Ambulatory Visit | Attending: Interventional Radiology | Admitting: Interventional Radiology

## 2020-04-29 DIAGNOSIS — I6389 Other cerebral infarction: Secondary | ICD-10-CM | POA: Diagnosis not present

## 2020-04-29 DIAGNOSIS — I6300 Cerebral infarction due to thrombosis of unspecified precerebral artery: Secondary | ICD-10-CM

## 2020-04-29 LAB — HEMOGLOBIN A1C
Hgb A1c MFr Bld: 6 % — ABNORMAL HIGH (ref 4.8–5.6)
Mean Plasma Glucose: 125.5 mg/dL

## 2020-04-29 LAB — URINALYSIS, ROUTINE W REFLEX MICROSCOPIC
Bilirubin Urine: NEGATIVE
Glucose, UA: NEGATIVE mg/dL
Hgb urine dipstick: NEGATIVE
Ketones, ur: NEGATIVE mg/dL
Leukocytes,Ua: NEGATIVE
Nitrite: NEGATIVE
Protein, ur: NEGATIVE mg/dL
Specific Gravity, Urine: 1.014 (ref 1.005–1.030)
pH: 6 (ref 5.0–8.0)

## 2020-04-29 LAB — LIPID PANEL
Cholesterol: 127 mg/dL (ref 0–200)
HDL: 70 mg/dL (ref >40)
LDL Cholesterol: 42 mg/dL (ref 0–99)
Total CHOL/HDL Ratio: 1.8 RATIO
Triglycerides: 77 mg/dL (ref <150)
VLDL: 15 mg/dL (ref 0–40)

## 2020-04-29 LAB — HEMOGLOBIN AND HEMATOCRIT, BLOOD
HCT: 34.2 % — ABNORMAL LOW (ref 36.0–46.0)
HCT: 34.7 % — ABNORMAL LOW (ref 36.0–46.0)
HCT: 35.9 % — ABNORMAL LOW (ref 36.0–46.0)
Hemoglobin: 10.8 g/dL — ABNORMAL LOW (ref 12.0–15.0)
Hemoglobin: 10.9 g/dL — ABNORMAL LOW (ref 12.0–15.0)
Hemoglobin: 11.5 g/dL — ABNORMAL LOW (ref 12.0–15.0)

## 2020-04-29 LAB — ECHOCARDIOGRAM COMPLETE
Height: 66 in
S' Lateral: 3.3 cm
Weight: 2631.41 oz

## 2020-04-29 LAB — RAPID URINE DRUG SCREEN, HOSP PERFORMED
Amphetamines: NOT DETECTED
Barbiturates: NOT DETECTED
Benzodiazepines: NOT DETECTED
Cocaine: NOT DETECTED
Opiates: NOT DETECTED
Tetrahydrocannabinol: NOT DETECTED

## 2020-04-29 LAB — GLUCOSE, CAPILLARY
Glucose-Capillary: 111 mg/dL — ABNORMAL HIGH (ref 70–99)
Glucose-Capillary: 118 mg/dL — ABNORMAL HIGH (ref 70–99)
Glucose-Capillary: 162 mg/dL — ABNORMAL HIGH (ref 70–99)
Glucose-Capillary: 106 mg/dL — ABNORMAL HIGH (ref 70–99)

## 2020-04-29 LAB — BASIC METABOLIC PANEL
Anion gap: 9 (ref 5–15)
BUN: 13 mg/dL (ref 8–23)
CO2: 22 mmol/L (ref 22–32)
Calcium: 9.2 mg/dL (ref 8.9–10.3)
Chloride: 106 mmol/L (ref 98–111)
Creatinine, Ser: 0.59 mg/dL (ref 0.44–1.00)
GFR, Estimated: >60 mL/min (ref >60)
Glucose, Bld: 131 mg/dL — ABNORMAL HIGH (ref 70–99)
Potassium: 3.8 mmol/L (ref 3.5–5.1)
Sodium: 137 mmol/L (ref 135–145)

## 2020-04-29 LAB — MRSA PCR SCREENING: MRSA by PCR: NEGATIVE

## 2020-04-29 IMAGING — MR MR HEAD W/O CM
9 of 10 series · 34 of 48 positions shown · non-contrast
Comparison: CT studies yesterday.  MRI [DATE].

CLINICAL DATA: Right-sided leg weakness.

EXAM:
MRI HEAD WITHOUT CONTRAST
TECHNIQUE: Multiplanar, multiecho pulse sequences of the brain and surrounding
structures were obtained without intravenous contrast.

[Series 2: DWI · axial · 3.0mm · 0.94mm/px · z∈[-82,+64]mm · 8 of 104 slices shown (1 of 2)]
[im 1/104]
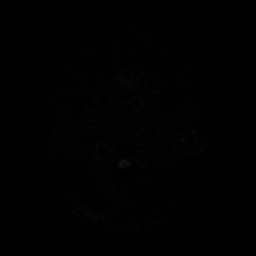
[im 12/104]
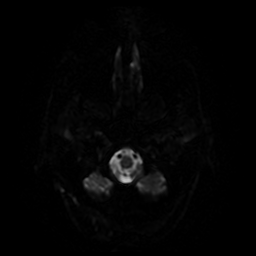
[im 35/104]
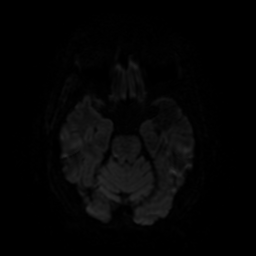
[im 46/104]
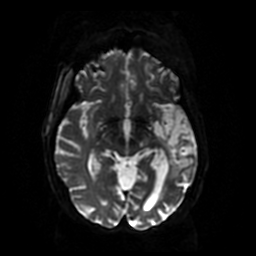
[im 58/104]
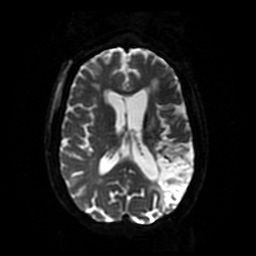
[im 69/104]
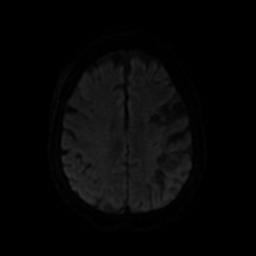
[im 92/104]
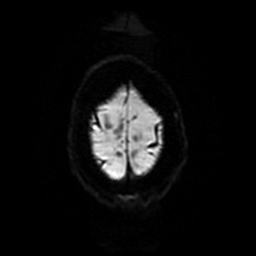
[im 104/104]
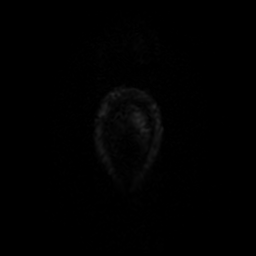

[Series 3: DWI · coronal · 4.0mm · 0.94mm/px · 7 of 74 slices shown (2 of 2)]
[im 1/74]
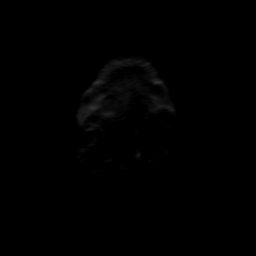
[im 13/74]
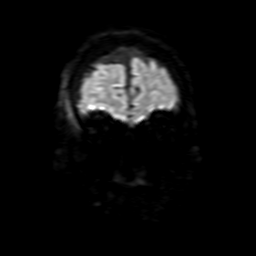
[im 25/74]
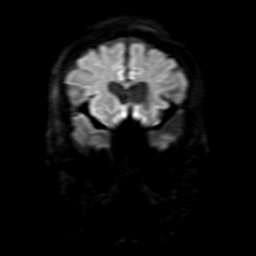
[im 37/74]
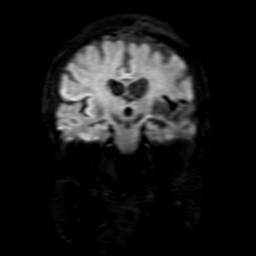
[im 49/74]
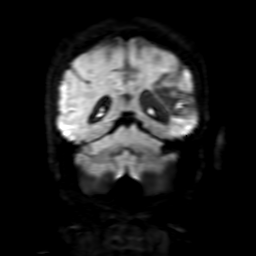
[im 61/74]
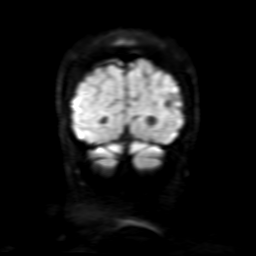
[im 74/74]
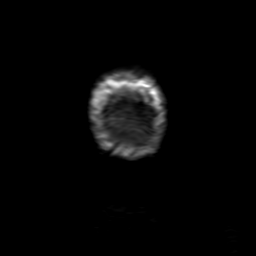

[Series 4: FLAIR · sagittal · 5.0mm · 0.47mm/px · 2 of 25 slices shown (1 of 2)]
[im 1/25]
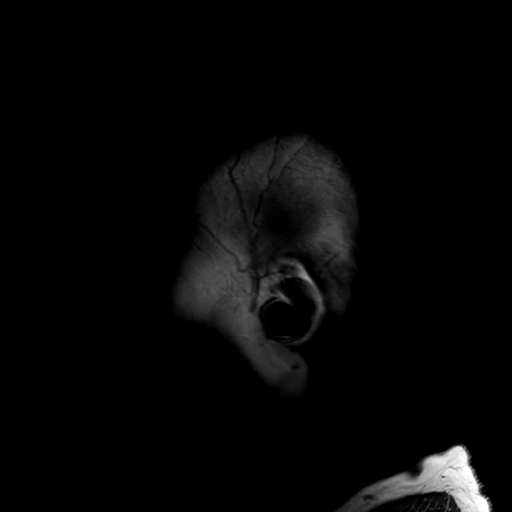
[im 25/25]
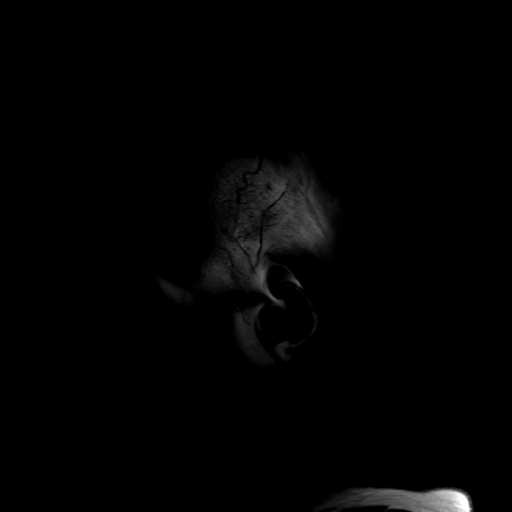

[Series 5: T2 · axial · 5.0mm · 0.47mm/px · z∈[-79,+64]mm · 2 of 26 slices shown (1 of 2)]
[im 1/26]
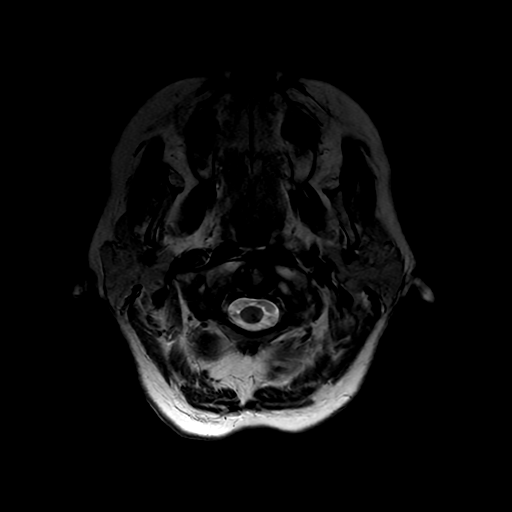
[im 26/26]
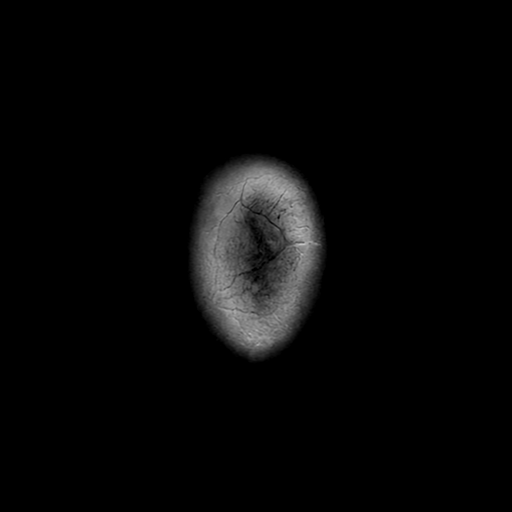

[Series 7: FLAIR · axial · 3.0mm · 0.45mm/px · z∈[-78,+65]mm · 2 of 26 slices shown (2 of 2)]
[im 1/26]
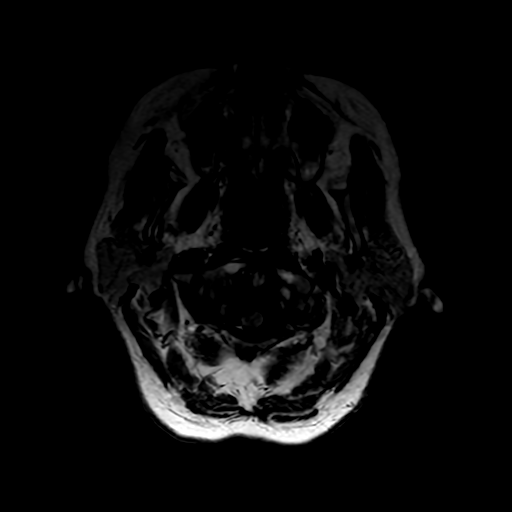
[im 26/26]
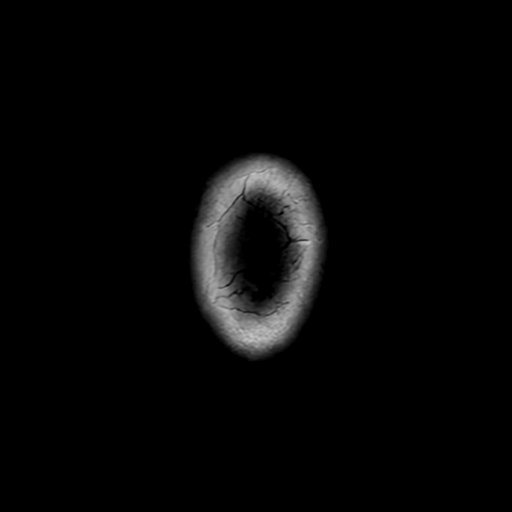

[Series 8: (person_name) · axial · 3.0mm · 0.47mm/px · z∈[-81,-64]mm · 2 of 104 slices shown]
[im 1/104]
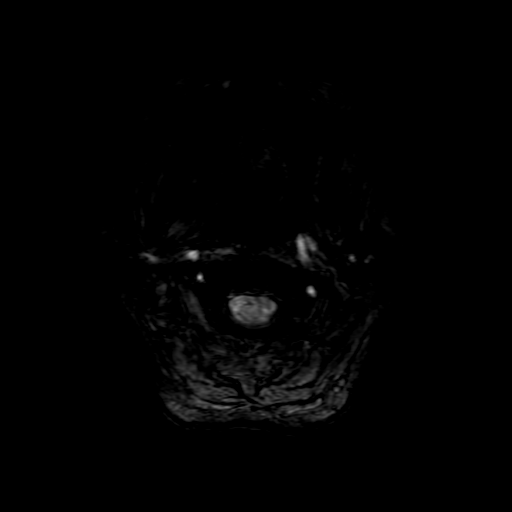
[im 13/104]
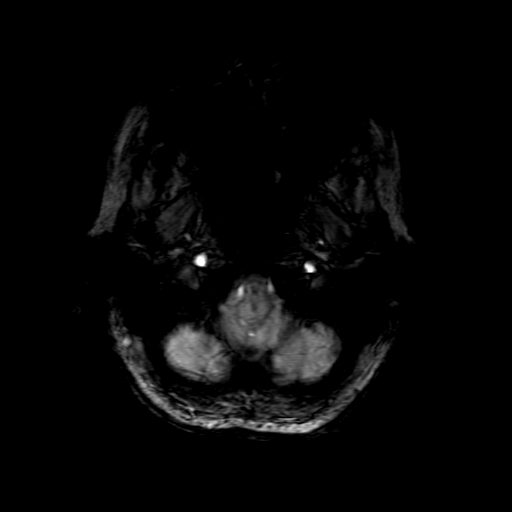

[Series 10: T2 · coronal · 5.0mm · 0.39mm/px · 3 of 31 slices shown (2 of 2)]
[im 1/31]
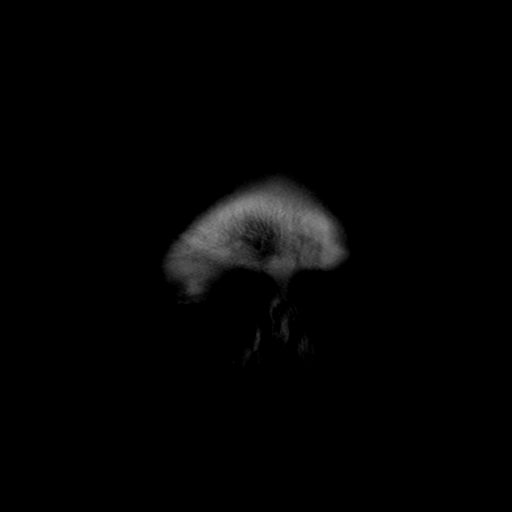
[im 16/31]
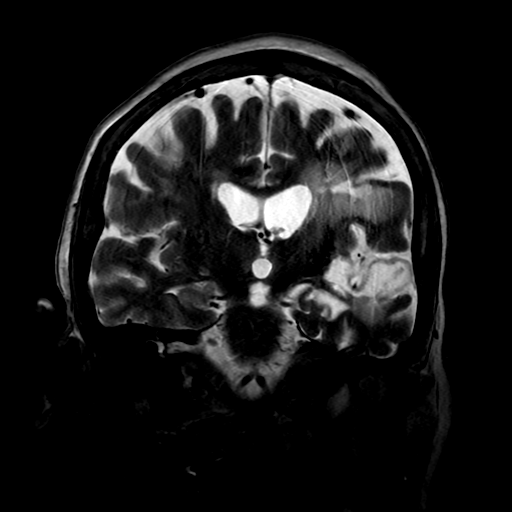
[im 31/31]
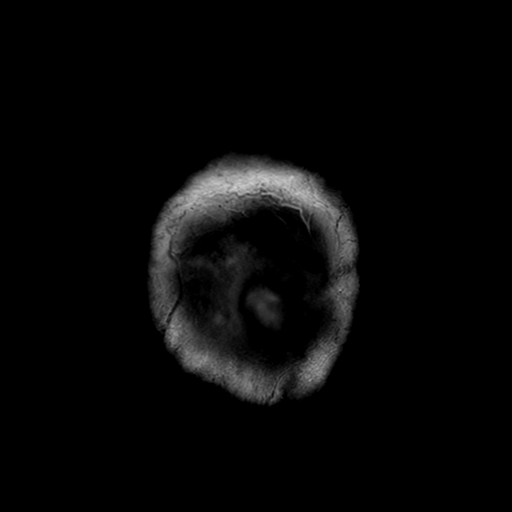

[Series 250: ADC · axial · 3.0mm · 0.94mm/px · z∈[-82,+64]mm · 5 of 52 slices shown (1 of 2)]
[im 1/52]
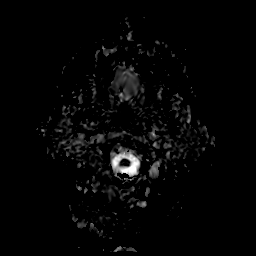
[im 13/52]
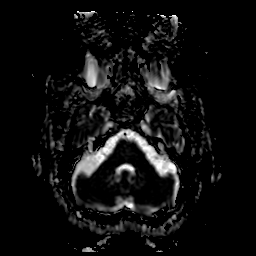
[im 26/52]
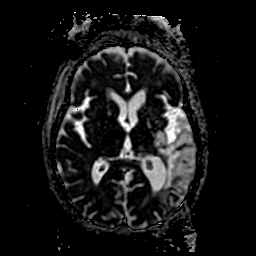
[im 39/52]
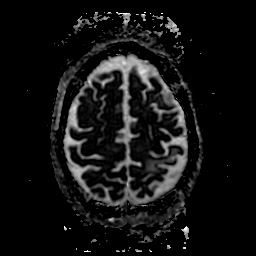
[im 52/52]
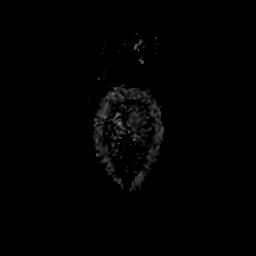

[Series 350: ADC · coronal · 4.0mm · 0.94mm/px · 3 of 37 slices shown (2 of 2)]
[im 1/37]
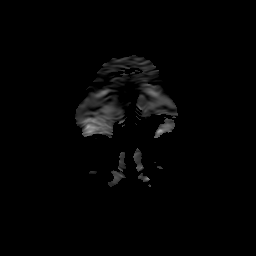
[im 19/37]
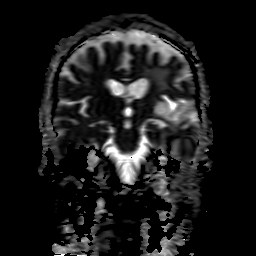
[im 37/37]
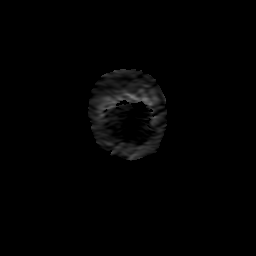

[34 of 48 positions shown; findings below may reference images not displayed]

FINDINGS: Brain: Chronic small-vessel ischemic changes affect pons. Changes of
Wallerian degeneration on the left. No focal cerebellar finding.

Right cerebral hemisphere shows an old small vessel infarction in
the thalamus and moderate chronic small-vessel changes of the white
matter.

Left cerebral hemisphere shows old infarction in the left middle
cerebral artery territory with atrophy, encephalomalacia and gliosis
affecting temporal lobe and parietal lobe. Some posterior frontal
involvement as well. There are areas of hemosiderin deposition, most
prominent at the posterior temporal lobe. I favor that this is
related to the old infarction, though there is some potential there
could be a more recent hemorrhage in that location, with mild
surrounding edema.

No obstructive hydrocephalus. Some ex vacuo enlargement of the left
lateral ventricle. No extra-axial collection.

Vascular: Major vessels at the base of the brain show flow.

Skull and upper cervical spine: Negative

Sinuses/Orbits: Clear/normal

Other: None
IMPRESSION: 1. Old infarction in the left middle cerebral artery territory.
Atrophy, encephalomalacia and gliosis. Hemosiderin deposition within
the region of old infarction. There is a small focus of more
prominent hemosiderin deposition at the posterior temporal lobe
which could possibly be a more recent hemorrhage/hemorrhagic
infarction, surrounded by a small bit of edema. This is located a
little bit below and posterior to the old infarction. Because of the
susceptibility artifact, I cannot accurately evaluate for restricted
diffusion. No gross blood is seen in this location on the CT scan.
2. Chronic small-vessel ischemic changes elsewhere throughout the
brain as outlined above.

## 2020-04-29 IMAGING — DX DG CHEST 1V PORT
1 series · 1 of 1 positions shown · non-contrast
Comparison: Radiograph [DATE]

CLINICAL DATA: Syncope earlier today.

EXAM:
PORTABLE CHEST 1 VIEW

[chest ap]
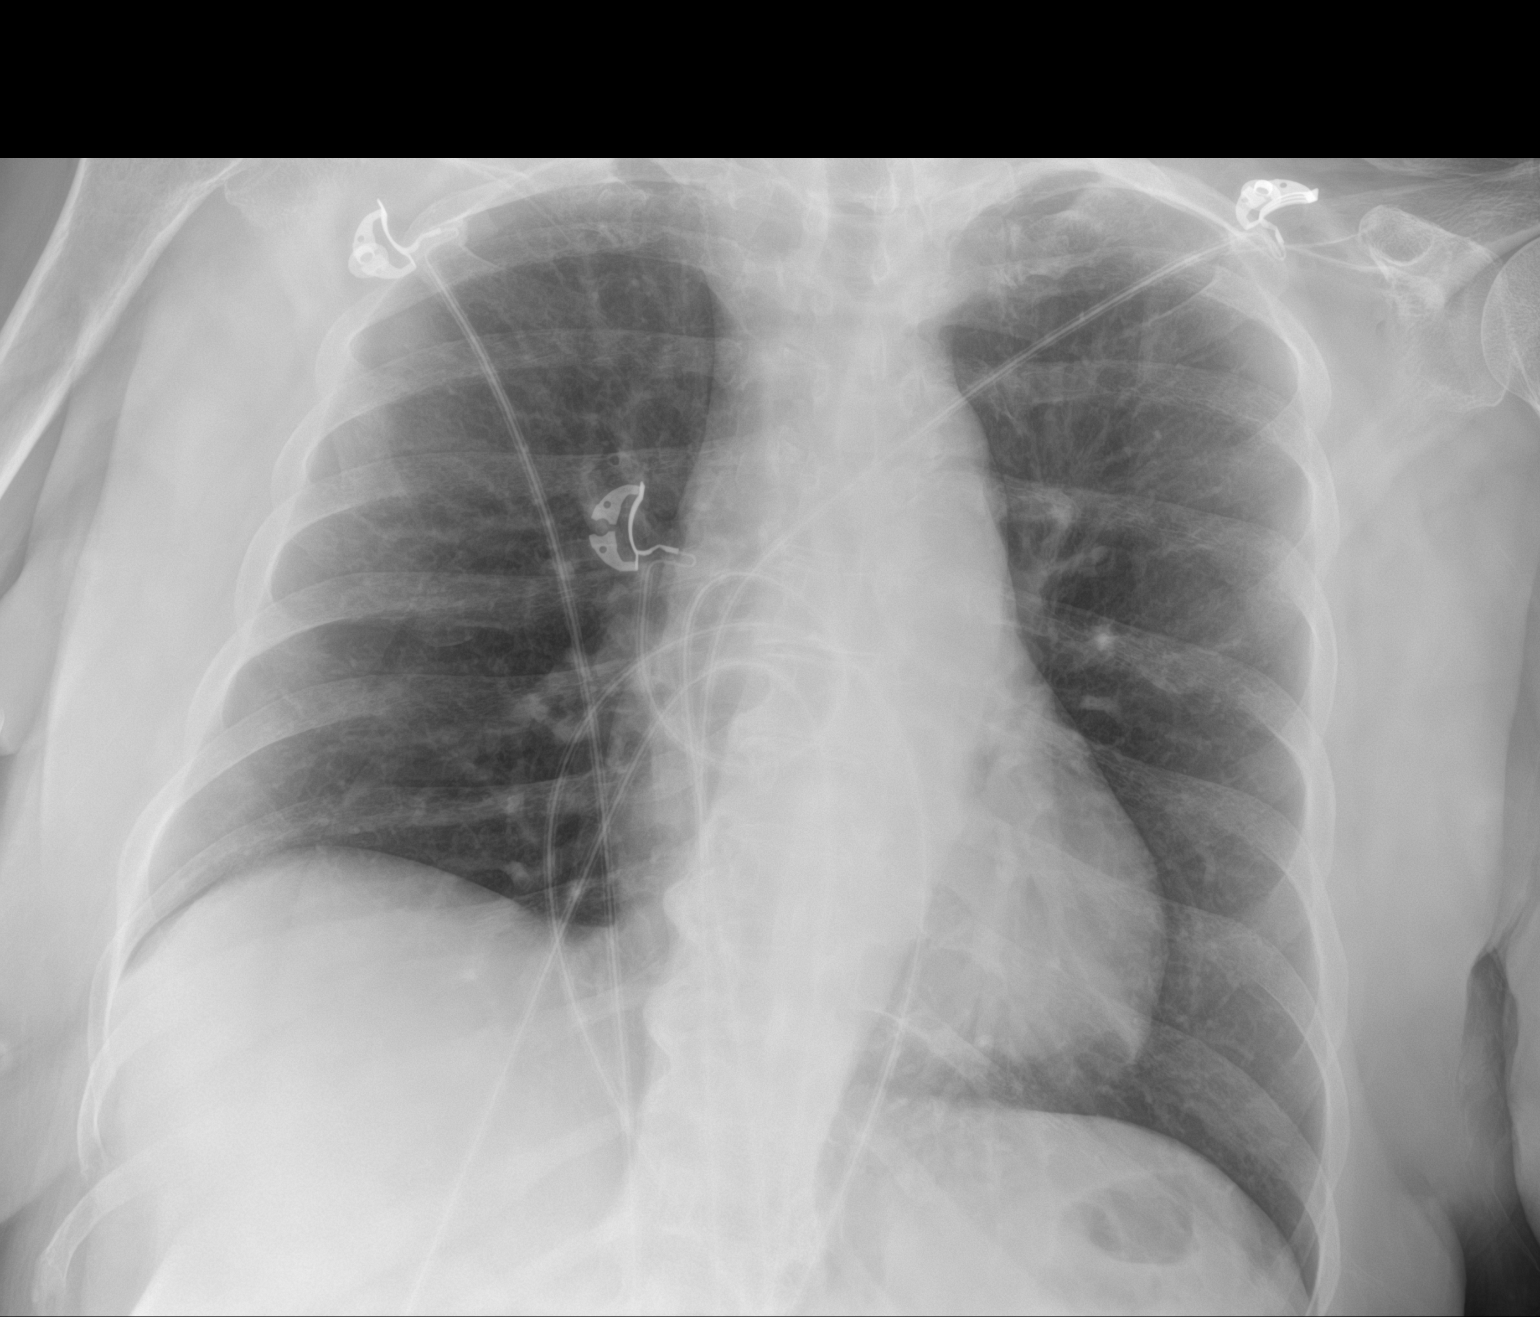

[1 of 1 positions shown; findings below may reference images not displayed]

FINDINGS: The cardiomediastinal contours are normal. The lungs are clear.
Pulmonary vasculature is normal. No consolidation, pleural effusion,
or pneumothorax. Thoracic spine degenerative change. No acute
osseous abnormalities are seen.
IMPRESSION: No acute chest findings.

## 2020-04-29 IMAGING — CT CT HEAD W/O CM
4 series · 15 of 47 positions shown, 17 images · non-contrast
Comparison: Prior MRI from earlier the same day.

CLINICAL DATA: Follow-up examination for acute stroke, cerebral
hemorrhage suspected.

EXAM:
CT HEAD WITHOUT CONTRAST
TECHNIQUE: Contiguous axial images were obtained from the base of the skull
through the vertex without intravenous contrast.

[Series 3: head without · axial · non-contrast · 0.43mm/px · z∈[+1248,+1363]mm · 7 of 31 slices shown, 9 images]
[im 4/31  brain]
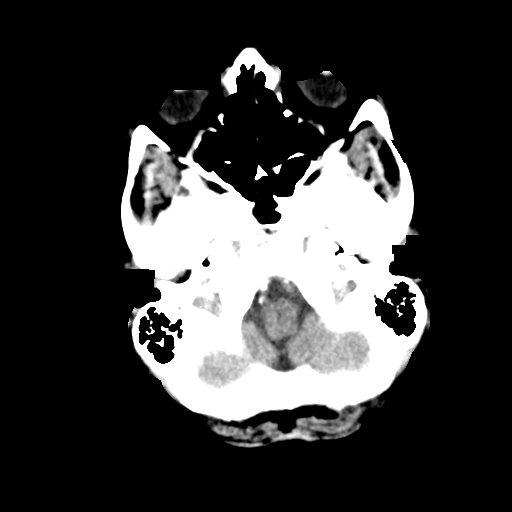
[im 4/31  bone]
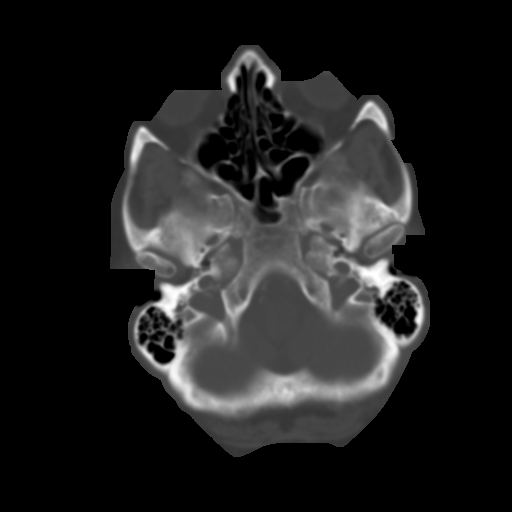
[im 8/31  brain]
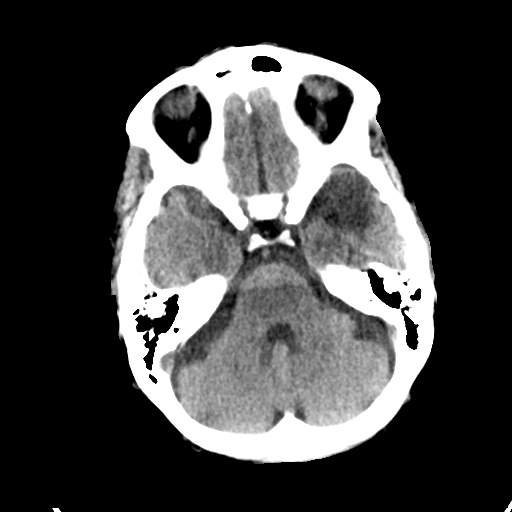
[im 12/31  brain]
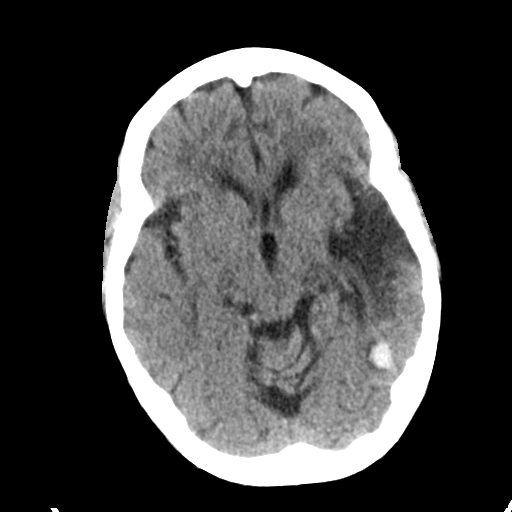
[im 16/31  brain]
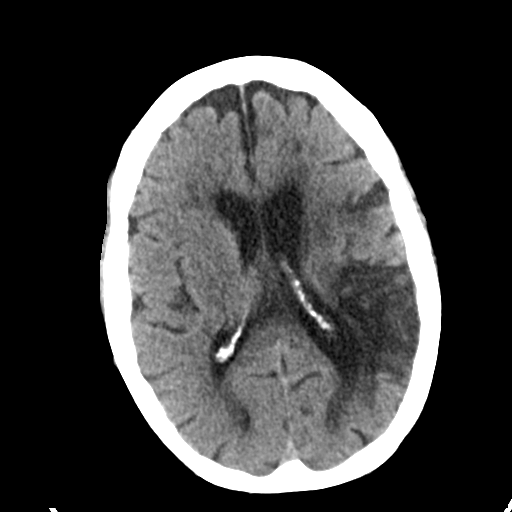
[im 19/31  brain]
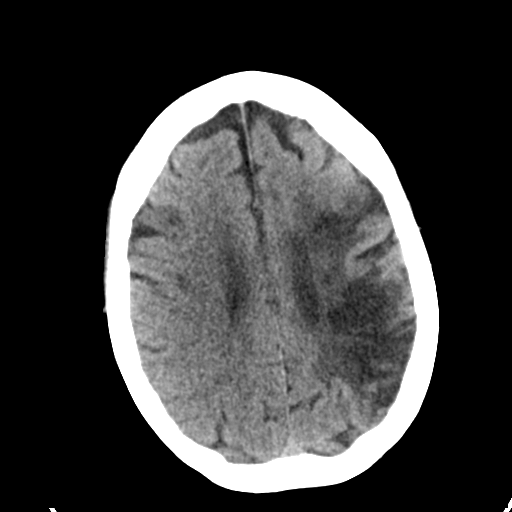
[im 19/31  bone]
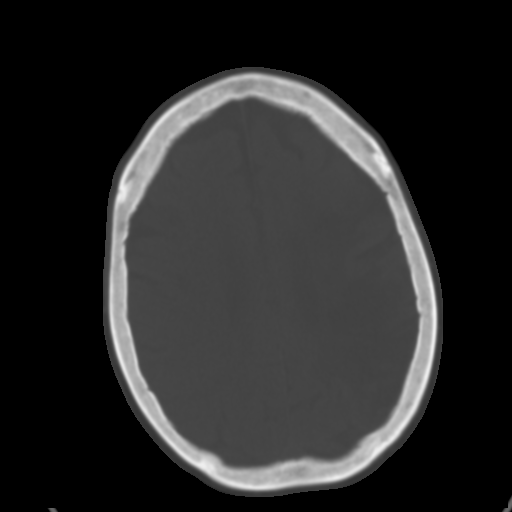
[im 23/31  brain]
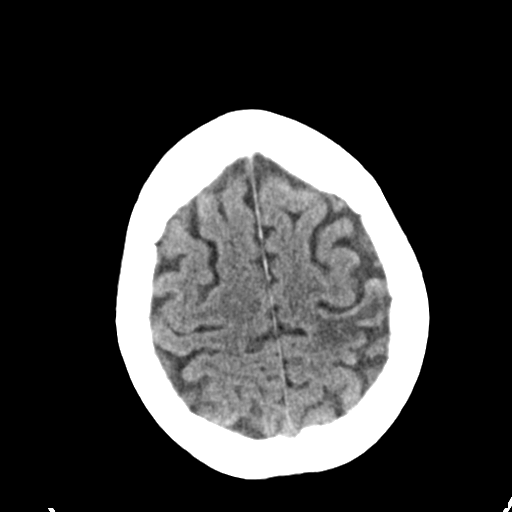
[im 27/31  brain]
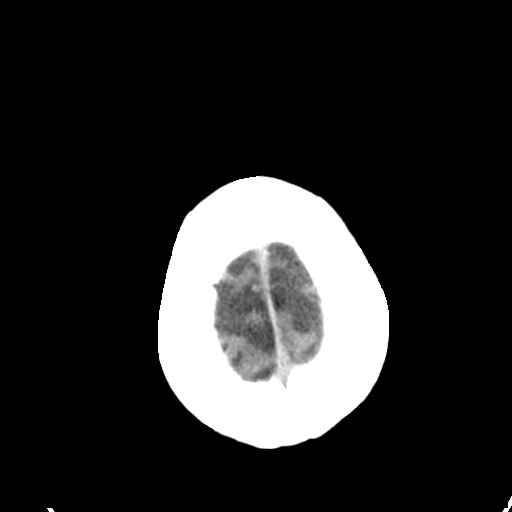

[Series 4: head bone · axial · 0.43mm/px · z∈[+1247,+1263]mm · 2 of 77 slices shown]
[im 8/77  bone]
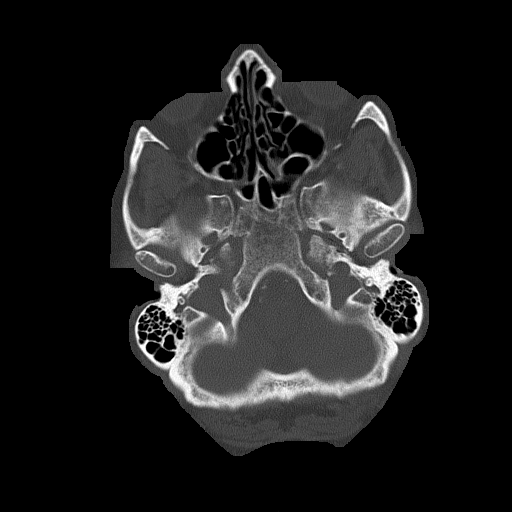
[im 16/77  bone]
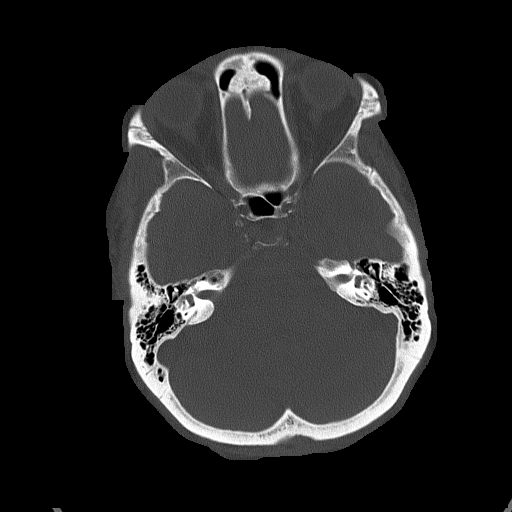

[Series 5: head without cor · coronal · non-contrast · 0.31mm/px · 3 of 68 slices shown]
[im 23/68  brain]
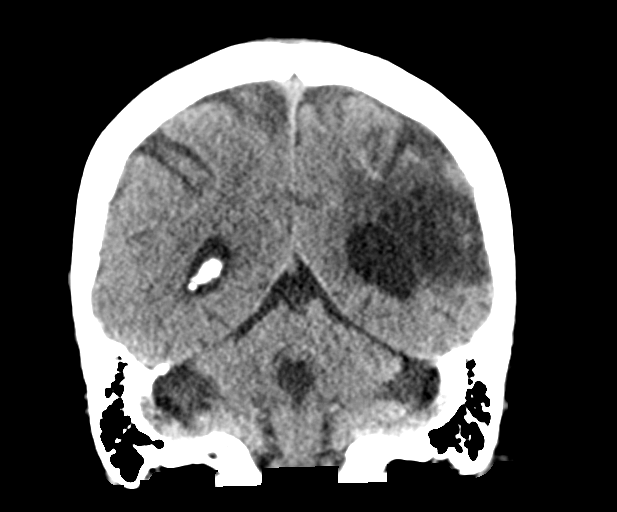
[im 30/68  brain]
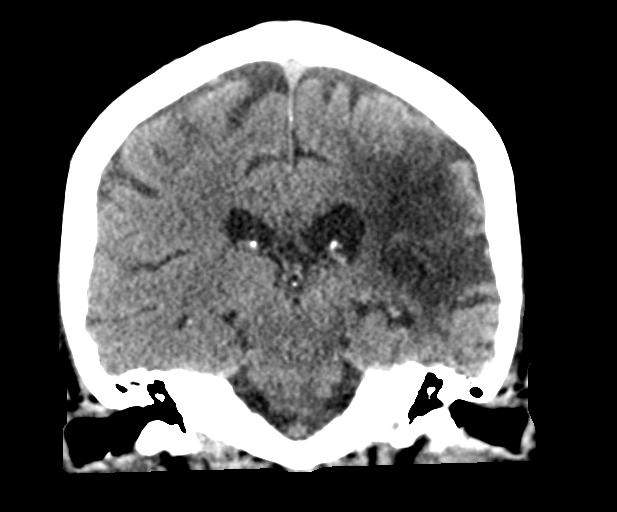
[im 38/68  brain]
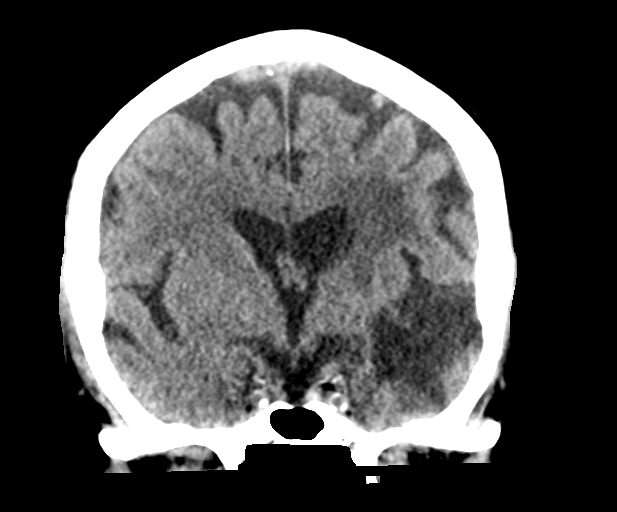

[Series 6: head without sag · sagittal · non-contrast · 0.33mm/px · 3 of 52 slices shown]
[im 18/52  brain]
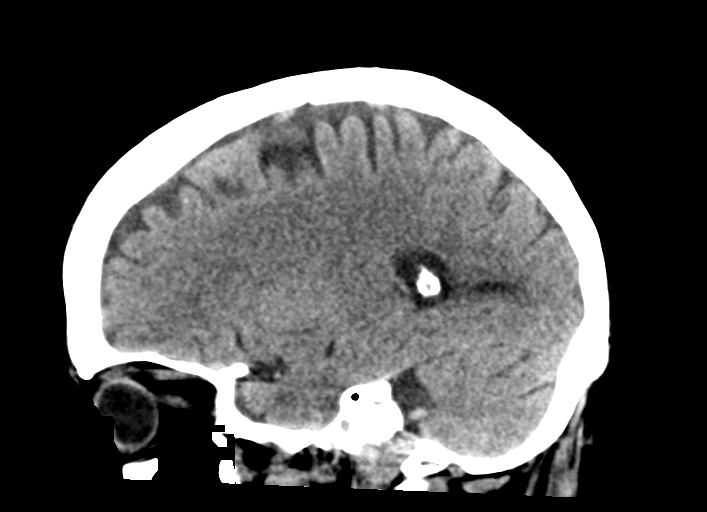
[im 26/52  brain]
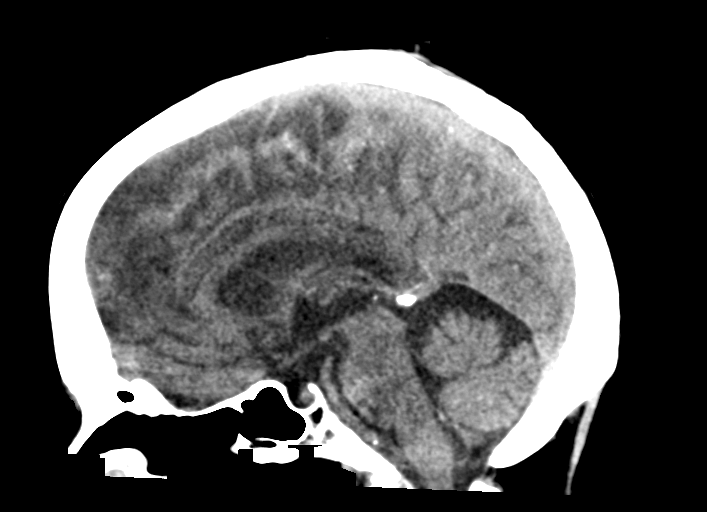
[im 35/52  brain]
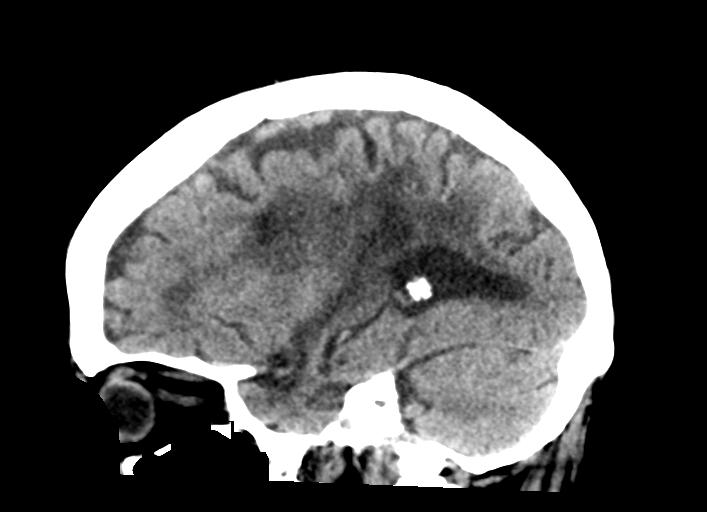

[15 of 47 positions shown; findings below may reference images not displayed]

FINDINGS: Brain: Atrophy with chronic small vessel ischemic disease again
noted. Chronic encephalomalacia related to a remote left MCA
territory infarct again noted. There is a new 1.5 cm
intraparenchymal hemorrhage positioned slightly posteriorly and
inferiorly to the chronic left MCA distribution infarct at the
posterior left temporal occipital region (series 3, image 12).
Minimal surrounding vasogenic edema without significant regional
mass effect. Additional 8 mm hemorrhage present at the right
perimesencephalic cistern (series 11, image 11). No associated
hemorrhage. Findings presumably related to prior tPA administration.
No other visible hemorrhage. Probable trace laminar necrosis at the
anterior margin of the chronic left MCA distribution infarct again
noted.

No visible acute or vaulting large vessel territory infarct. No mass
lesion, mass effect, or midline shift. No hydrocephalus or
extra-axial fluid collection.

Vascular: No hyperdense vessel.

Skull: Scalp soft tissues and calvarium within normal limits.

Sinuses/Orbits: Globes and orbital soft tissues demonstrate no acute
finding. Paranasal sinuses and mastoid air cells remain clear.

Other: 9.
IMPRESSION: 1. New 1.5 cm intraparenchymal hemorrhage positioned slightly
posteriorly and inferiorly to the chronic left MCA distribution
infarct. Minimal surrounding vasogenic edema without significant
regional mass effect.
2. Additional 8 mm hemorrhage at the right perimesencephalic
cistern.
3. No other new acute intracranial abnormality.
4. Underlying atrophy with chronic small vessel ischemic disease
with chronic left MCA territory infarct.

Critical Value/emergent results were called by telephone at the time
of interpretation on [DATE] at [DATE] to provider TRELLES
TRELLES , who verbally acknowledged these results.

## 2020-04-29 MED ORDER — LABETALOL HCL 5 MG/ML IV SOLN
10.0000 mg | INTRAVENOUS | Status: DC | PRN
  Administered 2020-04-30: 10 mg via INTRAVENOUS
  Filled 2020-04-29 (×2): qty 4

## 2020-04-29 MED ORDER — AMLODIPINE BESYLATE 10 MG PO TABS
10.0000 mg | ORAL_TABLET | Freq: Every day | ORAL | Status: DC
  Administered 2020-04-30 – 2020-05-02 (×3): 10 mg via ORAL
  Filled 2020-04-29 (×3): qty 1

## 2020-04-29 MED ORDER — LABETALOL HCL 5 MG/ML IV SOLN: 10.0000 mg | INTRAVENOUS | Status: DC | PRN

## 2020-04-29 MED ORDER — CHLORHEXIDINE GLUCONATE CLOTH 2 % EX PADS
6.0000 | MEDICATED_PAD | Freq: Every day | CUTANEOUS | Status: DC
  Administered 2020-04-29 – 2020-05-02 (×3): 6 via TOPICAL

## 2020-04-29 NOTE — Evaluation (Signed)
Physical Therapy Evaluation Patient Details Name: Lisa [name] MRN: [id number] DOB: [birth year] Today's Date: 04/29/2020   History of Present Illness  Pt is a 71 y.o. female who presents 04/28/20 secondary to a fall in which she lost consciousness and had increased facial droop, aphasia, and R-sided weakness. TPA was administered. Initial imaging negative. Pt being worked up for possible stroke. EEG showing cortical dysfunction arising from left frontotemporal region, nonspecific etiology, likely secondary to underlying encephalomalacia. No seizures or epileptiform discharges were seen throughout the recording. 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  Clinical Impression  Pt presents with condition above and deficits mentioned below, see PT Problem List. Pt has a hx of R-sided weakness from prior CVA. PTA, she was living with her husband and ambulating without an AD or external assistance. Currently, pt is disoriented to herself and demonstrating poor awareness into her deficits or safety concerns, including decreased R leg strength and coordination, imbalance, and inattention to R side. This places her at high risk for further falls or injury as she bumps into obstacles on her R and needs intermittent minA when she has bouts of LOB when ambulating without an AD. Will continue to follow acutely. Recommending follow-up with CIR for intensive therapy to maximize pt safety and independence with all functional mobility.    Follow Up Recommendations CIR;Supervision/Assistance - 24 hour    Equipment Recommendations  None recommended by PT    Recommendations for Other Services Rehab consult     Precautions / Restrictions Precautions Precautions: Fall;Other (comment) (cognition; poor safety) Restrictions Weight Bearing Restrictions: No      Mobility  Bed Mobility Overal  bed mobility: Needs Assistance Bed Mobility: Supine to Sit     Supine to sit: Min guard     General bed mobility comments: Pt sitting up in recliner upon arrival.    Transfers Overall transfer level: Needs assistance Equipment used: None Transfers: Sit to/from Stand Sit to Stand: Min guard;Min assist         General transfer comment: Min Guard-Min A for balance and safety, pt impulsive in coming to stand.  Ambulation/Gait Ambulation/Gait assistance: Min assist Gait Distance (Feet): 100 Feet Assistive device: None Gait Pattern/deviations: Step-through pattern;Decreased stance time - right;Decreased weight shift to right Gait velocity: reduced Gait velocity interpretation: <1.8 ft/sec, indicate of risk for recurrent falls General Gait Details: Pt ambulates with R UE pulled up into flexion with tendency to not be aware of body position, bumping into objects on the R on occasion despite cues to attend to it. Pt with intermittent staggering/LOB, needing min guard-A for stability throughout.  Stairs            Wheelchair Mobility    Modified Rankin (Stroke Patients Only) Modified Rankin (Stroke Patients Only) Pre-Morbid Rankin Score: Moderate disability Modified Rankin: Moderately severe disability     Balance Overall balance assessment: Needs assistance Sitting-balance support: No upper extremity supported Sitting balance-Leahy Scale: Good     Standing balance support: No upper extremity supported;During functional activity Standing balance-Leahy Scale: Fair Standing balance comment: Pt able to be mobile without UE support, but needing intermittent external assist due to bouts of LOB.                             Pertinent Vitals/Pain Pain Assessment: 0-10 Pain  Score: 0-No pain Faces Pain Scale: No hurt Pain Intervention(s): Monitored during session    Home Living Family/patient expects to be discharged to:: Private residence Living Arrangements:  Spouse/significant other Available Help at Discharge: Family;Available 24 hours/day Type of Home: Apartment Home Access: Stairs to enter;Elevator Entrance Stairs-Rails: Right;Left Entrance Stairs-Number of Steps: flight Home Layout: One level (second floor) Home Equipment: Shower seat;Grab bars - tub/shower;Hand held shower head      Prior Function Level of Independence: Needs assistance   Gait / Transfers Assistance Needed: Pt reports she ambulates without AD or assistance from husband.     Comments: Pt reporting she performed ADLs. Husband assist with IADLs. Receives [facility] SLP at this time.     Hand Dominance   Dominant Hand: Left (Different than prior admission)    Extremity/Trunk Assessment   Upper Extremity Assessment Upper Extremity Assessment: Defer to OT evaluation RUE Deficits / Details: Tendency for indwelling fist against chest with internal rotation and flexion at elbow. Poor AROM only perform forward flexion of shoulder to 80* with compensatory hiking. Pt with poor grasp strength and pinch strength. Decreased FM coorindation RUE Coordination: decreased fine motor;decreased gross motor    Lower Extremity Assessment Lower Extremity Assessment: RLE deficits/detail RLE Deficits / Details: MMT scores of 4- to 4 grossly compared to 4+ to 5 on L RLE Sensation:  (denies numbness/tingling) RLE Coordination: decreased fine motor;decreased gross motor (no dysmetria noted, but slowed and purposeful tapping of foot compared to L)       Communication   Communication: Expressive difficulties  Cognition Arousal/Alertness: Awake/alert Behavior During Therapy: Impulsive Overall Cognitive Status: Impaired/Different from baseline Area of Impairment: Attention;Memory;Following commands;Safety/judgement;Awareness;Problem solving;Orientation                 Orientation Level: Disoriented to;Person;Time (unable to remember year born, stated "the month before September" for her  birth month, stated April for current month; did not ask about location or situation) Current Attention Level: Sustained Memory: Decreased short-term memory;Decreased recall of precautions Following Commands: Follows one step commands inconsistently;Follows one step commands with increased time Safety/Judgement: Decreased awareness of safety;Decreased awareness of deficits Awareness: Intellectual Problem Solving: Slow processing;Difficulty sequencing;Requires verbal cues;Requires tactile cues General Comments: Poor awareness of deficits and safety concners in that she tends to be unaware of her R side, bumping it into objects with mobility. Pt impulsive with coming to stand and turning to opposite direction than cued on occasion. Disoriented to her own birthday and current date.      General Comments General comments (skin integrity, edema, etc.): HR elevating to 120s during oral care    Exercises     Assessment/Plan    PT Assessment Patient needs continued PT services  PT Problem List Decreased strength;Decreased range of motion;Decreased activity tolerance;Decreased balance;Decreased coordination;Decreased mobility;Decreased cognition;Decreased knowledge of use of DME;Decreased safety awareness;Decreased knowledge of precautions       PT Treatment Interventions DME instruction;Gait training;Stair training;Functional mobility training;Therapeutic activities;Therapeutic exercise;Balance training;Neuromuscular re-education;Cognitive remediation;Patient/family education    PT Goals (Current goals can be found in the Care Plan section)  Acute Rehab PT Goals Patient Stated Goal: Go home PT Goal Formulation: With patient Time For Goal Achievement: 05/13/20 Potential to Achieve Goals: Good    Frequency Min 4X/week   Barriers to discharge        Co-evaluation               AM-PAC PT "6 Clicks" Mobility  Outcome Measure Help needed turning from your back to your side  while in  a flat bed without using bedrails?: A Little Help needed moving from lying on your back to sitting on the side of a flat bed without using bedrails?: A Little Help needed moving to and from a bed to a chair (including a wheelchair)?: A Little Help needed standing up from a chair using your arms (e.g., wheelchair or bedside chair)?: A Little Help needed to walk in hospital room?: A Little Help needed climbing 3-5 steps with a railing? : A Lot 6 Click Score: 17    End of Session Equipment Utilized During Treatment: Gait belt Activity Tolerance: Patient tolerated treatment well Patient left: in chair;with call bell/phone within reach;with chair alarm set Nurse Communication: Mobility status PT Visit Diagnosis: Unsteadiness on feet (R26.81);Other abnormalities of gait and mobility (R26.89);Muscle weakness (generalized) (M62.81);History of falling (Z91.81);Difficulty in walking, not elsewhere classified (R26.2);Other symptoms and signs involving the nervous system (R29.898);Hemiplegia and hemiparesis Hemiplegia - Right/Left: Right Hemiplegia - dominant/non-dominant: Non-dominant Hemiplegia - caused by: Unspecified (prior CVA and unconfirmed CVA this admission)    Time: 7253-6644 PT Time Calculation (min) (ACUTE ONLY): 16 min   Charges:   PT Evaluation $PT Eval Moderate Complexity: 1 Mod          Moishe Spice, PT, DPT Acute Rehabilitation Services  Pager: [phone] Office: [place] 04/29/2020, 4:08 PM

## 2020-04-29 NOTE — Progress Notes (Signed)
Rehab Admissions Coordinator Note:  Patient was screened by Cleatrice Burke for appropriateness for an Inpatient Acute Rehab Consult per therapy recs. Noted initial imaging negative. I will await her progress with therapy and confirmation of diagnosis before proceeding to assist with dispo. She may progress to be able to d/c directly home.  Cleatrice Burke RN MSN 04/29/2020, 4:29 PM  I can be reached at [phone].

## 2020-04-29 NOTE — Procedures (Signed)
Patient Name: Lisa [name]  MRN: [id number]  Epilepsy Attending: Lora Havens  Referring Physician/Provider: Dr Donnetta Simpers Date: 04/28/2020 Duration: 23.44 mins  Patient history: 70 y.o. 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 EEG to evaluate for seizure  Level of alertness: Awake, asleep  AEDs during EEG study: LEV  Technical aspects: This EEG study was done with scalp electrodes positioned according to the 10-20 International system of electrode placement. Electrical activity was acquired at a sampling rate of 500Hz  and reviewed with a high frequency filter of 70Hz  and a low frequency filter of 1Hz . EEG data were recorded continuously and digitally stored.   Description: The posterior dominant rhythm consists of 9-10 Hz activity of moderate voltage (25-35 uV) seen predominantly in posterior head regions, symmetric and reactive to eye opening and eye closing. Sleep was characterized by vertex waves, sleep spindles (12 to 14 Hz), maximal frontocentral region. EEG showed continuous 3 to 6 Hz theta-delta slowing in left frontotemporal region. Hyperventilation and photic stimulation were not performed.     ABNORMALITY - Continuous slow, left frontotemporal region  IMPRESSION: This study is suggestive of cortical dysfunction arising from left frontotemporal region, nonspecific etiology, likely secondary to underlying encephalomalacia.  No seizures or epileptiform discharges were seen throughout the recording.  Lisa [name]

## 2020-04-29 NOTE — Evaluation (Addendum)
Occupational Therapy Evaluation Patient Details Name: Lisa [name] MRN: [id number] DOB: [birth year] Today's Date: 04/29/2020    History of Present Illness Pt is a 70 y.o. female who presents 04/28/20 secondary to a fall in which she lost consciousness and had increased facial droop, aphasia, and R-sided weakness. TPA was administered. Initial imaging negative. Pt being worked up for possible stroke. EEG showing cortical dysfunction arising from left frontotemporal region, nonspecific etiology, likely secondary to underlying encephalomalacia. No seizures or epileptiform discharges were seen throughout the recording. 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   Clinical Impression   PTA, pt was living with her husband and was performing BADLs. Pt currently requiring Min Guard-Min A for ADLs and functional mobility. Pt presenting poor balance, attention to R, functional use of RUE, and cognition impacting her safety and performance of ADLs. Pt presenting with decreased awareness of deficits and requiring Max cues to recognize errors in ADLs. Pt reporting her husband is present at home and supportive. Pt will require further acute OT to facilitate safe dc. Recommend dc to CIR for intensive OT to optimize safety, independence with ADLs, and return to PLOF.     Follow Up Recommendations  CIR;Supervision/Assistance - 24 hour    Equipment Recommendations  Other (comment) (Defer to next venue)    Recommendations for Other Services PT consult     Precautions / Restrictions Precautions Precautions: Fall;Other (comment) (cognition; poor safety)      Mobility Bed Mobility Overal bed mobility: Needs Assistance Bed Mobility: Supine to Sit     Supine to sit: Min guard     General bed mobility comments: MIn Guard A for safety    Transfers Overall transfer level: Needs  assistance Equipment used: None Transfers: Sit to/from Stand Sit to Stand: Min guard;Min assist         General transfer comment: Min Guard-Min A for balance and safety    Balance Overall balance assessment: Needs assistance Sitting-balance support: No upper extremity supported Sitting balance-Leahy Scale: Good     Standing balance support: No upper extremity supported;During functional activity Standing balance-Leahy Scale: Fair Standing balance comment: able to perform static standing at sink                           ADL either performed or assessed with clinical judgement   ADL Overall ADL's : Needs assistance/impaired Eating/Feeding: Set up;Sitting   Grooming: Min guard;Oral care;Cueing for sequencing;Standing Grooming Details (indicate cue type and reason): Cues for locating grooming items in R visual field. Min Guard A for safety in standing. Upper Body Bathing: Minimal assistance;Sitting   Lower Body Bathing: Minimal assistance;Sit to/from stand   Upper Body Dressing : Minimal assistance;Sitting   Lower Body Dressing: Minimal assistance;Sit to/from stand Lower Body Dressing Details (indicate cue type and reason): Donned socks using figure four method. Poor grasp and pinch strength with RUE as seen by pt dropping or loosing grasp on sock to pull onto foot. Toilet Transfer: Min guard;Minimal assistance;Ambulation (simulated to recliner)           Functional mobility during ADLs: Minimal assistance General ADL Comments: Pt presenting with poor attention and balance impacting her safety and performance of ADLs.     Vision Baseline Vision/History: Wears glasses Wears Glasses: At all times Patient Visual Report: No change from baseline  Vision Assessment?: Yes Eye Alignment: Within Functional Limits Ocular Range of Motion: Within Functional Limits Alignment/Gaze Preference: Within Defined Limits Tracking/Visual Pursuits: Decreased smoothness of  horizontal tracking;Decreased smoothness of vertical tracking;Unable to hold eye position out of midline;Requires cues, head turns, or add eye shifts to track (Visual fatigue and poor visual attention) Convergence: Impaired (comment) Additional Comments: Pt with poor smooth tracking to all visual fields. Unable to hold gaze outside of midline. Requiring head turns.     Perception     Praxis      Pertinent Vitals/Pain Pain Assessment: Faces Faces Pain Scale: No hurt Pain Intervention(s): Monitored during session;Repositioned     Hand Dominance Left (Different than prior admission)   Extremity/Trunk Assessment Upper Extremity Assessment Upper Extremity Assessment: RUE deficits/detail RUE Deficits / Details: Tendency for indwelling fist against chest with internal rotation and flexion at elbow. Poor AROM only perform forward flexion of shoulder to 80* with compensatory hiking. Pt with poor grasp strength and pinch strength. Decreased FM coorindation RUE Coordination: decreased fine motor;decreased gross motor   Lower Extremity Assessment Lower Extremity Assessment: Defer to PT evaluation       Communication Communication Communication: Expressive difficulties   Cognition Arousal/Alertness: Awake/alert Behavior During Therapy: Impulsive Overall Cognitive Status: Impaired/Different from baseline Area of Impairment: Attention;Memory;Following commands;Safety/judgement;Awareness;Problem solving                   Current Attention Level: Sustained Memory: Decreased short-term memory;Decreased recall of precautions Following Commands: Follows one step commands inconsistently;Follows one step commands with increased time Safety/Judgement: Decreased awareness of safety;Decreased awareness of deficits Awareness: Intellectual Problem Solving: Slow processing;Difficulty sequencing;Requires verbal cues;Requires tactile cues General Comments: Poor awareness of deficits. Requiring Max  cues to recognize errors during ADLs and questions. When asking pt to name three animals that start with "F" pt stating "beaver!" When providing cues that beaver does not start with F, pt persistent that it does start with F. When asking pt why donning socks was difficult, she stated "because the floor was hard". Pt with no awarnesss of urinary incontience in the bed   General Comments  HR elevating to 120s during oral care    Exercises     Shoulder Instructions      Home Living Family/patient expects to be discharged to:: Private residence Living Arrangements: Spouse/significant other Available Help at Discharge: Family;Available 24 hours/day Type of Home: Apartment Home Access: Stairs to enter;Elevator Entrance Stairs-Number of Steps: flight Entrance Stairs-Rails: Right;Left Home Layout: One level (second floor)     Bathroom Shower/Tub: Teacher, early years/pre: Standard     Home Equipment: Shower seat;Grab bars - tub/shower;Hand held shower head          Prior Functioning/Environment Level of Independence: Needs assistance        Comments: Pt reporting she performed ADLs. Husband assist with IADLs. Receives [facility] SLP at this time.        OT Problem List:        OT Treatment/Interventions: Self-care/ADL training;Therapeutic exercise;Energy conservation;DME and/or AE instruction;Therapeutic activities;Patient/family education    OT Goals(Current goals can be found in the care plan section) Acute Rehab OT Goals Patient Stated Goal: Go home OT Goal Formulation: With patient Time For Goal Achievement: 05/13/20 Potential to Achieve Goals: Good  OT Frequency: Min 2X/week   Barriers to D/C:            Co-evaluation              AM-PAC OT "6 Clicks"  Daily Activity     Outcome Measure Help from another person eating meals?: None Help from another person taking care of personal grooming?: A Little Help from another person toileting, which includes  using toliet, bedpan, or urinal?: A Little Help from another person bathing (including washing, rinsing, drying)?: A Little Help from another person to put on and taking off regular upper body clothing?: A Little Help from another person to put on and taking off regular lower body clothing?: A Little 6 Click Score: 19   End of Session Equipment Utilized During Treatment: Gait belt Nurse Communication: Mobility status  Activity Tolerance: Patient tolerated treatment well Patient left: in chair;with call bell/phone within reach;with chair alarm set  OT Visit Diagnosis: Unsteadiness on feet (R26.81);Other abnormalities of gait and mobility (R26.89);Muscle weakness (generalized) (M62.81);Hemiplegia and hemiparesis Hemiplegia - Right/Left: Right Hemiplegia - dominant/non-dominant:  (?) Hemiplegia - caused by: Cerebral infarction                Time: 1224-4975 OT Time Calculation (min): 26 min Charges:  OT General Charges $OT Visit: 1 Visit OT Evaluation $OT Eval Moderate Complexity: 1 Mod OT Treatments $Self Care/Home Management : 8-22 mins  Charis Capehart MSOT, OTR/L Acute Rehab Pager: [phone] Office: [place] 04/29/2020, 3:18 PM

## 2020-04-29 NOTE — Progress Notes (Signed)
PT Cancellation Note  Patient Details Name: KAMEA DACOSTA MRN: [id number] DOB: Oct 30, 1950   Cancelled Treatment:    Reason Eval/Treat Not Completed: Active bedrest order. Will follow-up as time allows.  Moishe Spice, PT, DPT Acute Rehabilitation Services  Pager: [phone] Office: [place] 04/29/2020, 7:32 AM

## 2020-04-29 NOTE — Progress Notes (Signed)
OT Cancellation Note  Patient Details Name: SYDNEE LAMOUR MRN: [id number] DOB: 1950-10-16   Cancelled Treatment:    Reason Eval/Treat Not Completed: Active bedrest order (TPA given at 2050 on 3/17) Will return as schedule allows.   [place], OTR/L Acute Rehab Pager: [phone] Office: [phone] 04/29/2020, 8:18 AM

## 2020-04-29 NOTE — Progress Notes (Signed)
STROKE TEAM PROGRESS NOTE    Brief HPI:  She had a brief episode of loss of consciousness yesterday with LOC for several seconds, husband heard thud and found her on floor. Once she became responsive again she was confused with impaired speech. She was brought to the ED by EMS. S/p tPA 2050. Initial imaging negative.   INTERVAL HISTORY She was treated with IV TPA is doing very well. Neurologically stable . Adequate blood pressure control. Tmax 99.   Husband at bedside. Stroke work up and ongoing plan of care discussed with patient and husband at bedside.   Vitals:   04/29/20 0630 04/29/20 0700 04/29/20 0800 04/29/20 0900  BP: (!) 147/79 138/84 (!) 148/74   Pulse: 78 81 84   Resp: 19 19 (!) 21 20  Temp:   99.1 F (37.3 C)   TempSrc:   Oral   SpO2: 96% 96% 95%   Weight:      Height:       CBC:  Recent Labs  Lab 04/28/20 2033 04/28/20 2038 04/28/20 2249 04/29/20 0240  WBC 9.8  --   --   --   NEUTROABS 5.5  --   --   --   HGB 12.2   < > 11.5* 10.8*  HCT 39.7   < > 35.8* 34.2*  MCV 88.8  --   --   --   PLT 236  --   --   --    < > = values in this interval not displayed.   Basic Metabolic Panel:  Recent Labs  Lab 04/28/20 2033 04/28/20 2038 04/29/20 0240  NA 136 138 137  K 3.7 3.8 3.8  CL 102 101 106  CO2 24  --  22  GLUCOSE 131* 129* 131*  BUN 15 17 13   CREATININE 0.64 0.90 0.59  CALCIUM 9.6  --  9.2   Lipid Panel:  Recent Labs  Lab 04/29/20 0240  CHOL 127  TRIG 77  HDL 70  CHOLHDL 1.8  VLDL 15  LDLCALC 42   HgbA1c:  Recent Labs  Lab 04/29/20 0240  HGBA1C 6.0*   Urine Drug Screen: No results for input(s): LABOPIA, COCAINSCRNUR, LABBENZ, AMPHETMU, THCU, LABBARB in the last 168 hours.  Alcohol Level  Recent Labs  Lab 04/28/20 2033  ETH 147*    IMAGING past 24 hours EEG adult  Result Date: 04/29/2020 Lora Havens, MD     04/29/2020  8:17 AM Patient Name: Lisa [name] MRN: [id number] Epilepsy Attending: Lora Havens Referring  Physician/Provider: Dr Donnetta Simpers Date: 04/28/2020 Duration: 23.44 mins Patient history: 70 y.o. 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 EEG to evaluate for seizure Level of alertness: Awake, asleep AEDs during EEG study: LEV Technical aspects: This EEG study was done with scalp electrodes positioned according to the 10-20 International system of electrode placement. Electrical activity was acquired at a sampling rate of 500Hz  and reviewed with a high frequency filter of 70Hz  and a low frequency filter of 1Hz . EEG data were recorded continuously and digitally stored. Description: The posterior dominant rhythm consists of 9-10 Hz activity of moderate voltage (25-35 uV) seen predominantly in posterior head regions, symmetric and reactive to eye opening and eye closing. Sleep was characterized by vertex waves, sleep  spindles (12 to 14 Hz), maximal frontocentral region. EEG showed continuous 3 to 6 Hz theta-delta slowing in left frontotemporal region. Hyperventilation and photic stimulation were not performed.   ABNORMALITY - Continuous slow, left frontotemporal region IMPRESSION: This study is suggestive of cortical dysfunction arising from left frontotemporal region, nonspecific etiology, likely secondary to underlying encephalomalacia. No seizures or epileptiform discharges were seen throughout the recording. Lora Havens   CT HEAD CODE STROKE WO CONTRAST  Result Date: 04/28/2020 CLINICAL DATA:  Code stroke. Initial evaluation for acute right-sided weakness with facial droop. Stroke 6 EXAM: CT HEAD WITHOUT CONTRAST TECHNIQUE: Contiguous axial images were obtained from the base of the skull through the vertex without intravenous contrast. COMPARISON:  Prior CT and MRI from 09/17/2019. FINDINGS: Brain:  Generalized age-related cerebral atrophy with chronic small vessel ischemic disease. Extensive encephalomalacia throughout the left cerebral hemisphere, consistent with a chronic left MCA distribution infarct. Small focus of mild hyperdensity at the anterior margin of the chronic ischemic infarct favored to be most consistent with a small amount of residual laminar necrosis (series 4, image 22). Wallerian degeneration noted at the left cerebral peduncle and midbrain. No other evidence for acute intracranial hemorrhage. No other acute large vessel territory infarct. No mass lesion, midline shift or mass effect. No hydrocephalus or extra-axial fluid collection. Vascular: No hyperdense vessel. Calcified atherosclerosis at the skull base. Skull: Scalp soft tissues and calvarium within normal limits. Sinuses/Orbits: Right gaze noted. Globes and orbital soft tissues demonstrate no other acute finding. Paranasal sinuses are clear. No mastoid effusion. Other: None. ASPECTS [facility] Stroke Program Early CT Score) - Ganglionic level infarction (caudate, lentiform nuclei, internal capsule, insula, M1-M3 cortex): 7 - Supraganglionic infarction (M4-M6 cortex): 3 Total score (0-10 with 10 being normal): 10 IMPRESSION: 1. No acute intracranial infarct or other abnormality. 2. ASPECTS is 10. 3. Large remote left MCA distribution infarct. Small focus of hyperdensity along the anterior margin of the chronic ischemic infarct favored to reflect a small amount of residual laminar necrosis. These results were communicated to Dr. Lorrin Goodell at 8:48 pmon 3/17/2022by text page via the [facility] messaging system. Electronically Signed   By: Jeannine Boga M.D.   On: 04/28/2020 20:51   CT ANGIO HEAD CODE STROKE  Result Date: 04/28/2020 CLINICAL DATA:  Initial evaluation for acute stroke, right-sided leg weakness. EXAM: CT ANGIOGRAPHY HEAD AND NECK TECHNIQUE: Multidetector CT imaging of the head and neck was performed using the standard  protocol during bolus administration of intravenous contrast. Multiplanar CT image reconstructions and MIPs were obtained to evaluate the vascular anatomy. Carotid stenosis measurements (when applicable) are obtained utilizing NASCET criteria, using the distal internal carotid diameter as the denominator. CONTRAST:  58mL OMNIPAQUE IOHEXOL 350 MG/ML SOLN COMPARISON:  Prior head CT from earlier the same day. FINDINGS: CTA NECK FINDINGS Aortic arch: Visualized aortic arch of normal caliber with normal 3 vessel morphology. Mild atheromatous change about the arch and origin of the great vessels without hemodynamically significant stenosis. Right carotid system: Right CCA regular but patent to the bifurcation without flow-limiting stenosis. Bulky calcified plaque about the right carotid bulb/proximal right ICA with associated stenosis of up to approximate 50% by NASCET criteria. Right ICA patent distally to the skull base without stenosis, dissection or occlusion. Left carotid system: Left CCA patent from its origin to the bifurcation. Vascular stent traverses the left carotid bifurcation and extends into the proximal left ICA. Widely patent flow through the stent. Left ICA widely patent distally without stenosis, evidence for dissection  or occlusion. Vertebral arteries: Both vertebral arteries arise from the subclavian arteries. Vertebral arteries patent within the neck without stenosis, dissection or occlusion. Skeleton: No acute osseous finding. No discrete or worrisome osseous lesions. 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 Moderate spondylosis noted elsewhere within the cervical spine without high-grade stenosis. Other neck: No other acute soft tissue abnormality within the neck. No mass or adenopathy. Upper chest: Visualized upper chest demonstrates no acute finding. Review of the MIP images confirms the above findings CTA HEAD FINDINGS Anterior circulation: Petrous segments patent bilaterally.  Scattered atheromatous plaque throughout the carotid siphons with associated mild to moderate multifocal narrowing. A1 segments patent bilaterally. Normal anterior communicating artery complex. Anterior cerebral arteries patent to their distal aspects without high-grade stenosis. No M1 stenosis or occlusion. Negative MCA bifurcations. No visible proximal MCA branch occlusion. Distal left MCA branches somewhat small and atrophic in appearance related to the chronic left MCA distribution infarct. Diffuse small vessel atheromatous irregularity. Posterior circulation: Both V4 segments patent to the vertebrobasilar junction without high-grade stenosis. Both PICA origins patent and normal. Basilar irregular but patent to its distal aspect without significant stenosis. Superior cerebellar arteries patent bilaterally. Right PCA supplied via the basilar. Predominant fetal type origin of the left PCA. Both PCAs remain patent to their distal aspects without stenosis. Venous sinuses: Grossly patent allowing for timing the contrast bolus. Anatomic variants: Predominant fetal type origin of the left PCA. No aneurysm. Review of the MIP images confirms the above findings IMPRESSION: 1. Negative CTA for emergent large vessel occlusion. 2. 50% atheromatous stenosis at the origin of the right ICA. 3. Widely patent left carotid stent. 4. Moderate intracranial atherosclerotic disease without proximal high-grade or correctable stenosis. Critical Value/emergent results were called by telephone at the time of interpretation on 04/28/2020 at 8:58 pm to provider [facility] , who verbally acknowledged these results. Electronically Signed   By: Jeannine Boga M.D.   On: 04/28/2020 21:18   CT ANGIO NECK CODE STROKE  Result Date: 04/28/2020 CLINICAL DATA:  Initial evaluation for acute stroke, right-sided leg weakness. EXAM: CT ANGIOGRAPHY HEAD AND NECK TECHNIQUE: Multidetector CT imaging of the head and neck was performed using  the standard protocol during bolus administration of intravenous contrast. Multiplanar CT image reconstructions and MIPs were obtained to evaluate the vascular anatomy. Carotid stenosis measurements (when applicable) are obtained utilizing NASCET criteria, using the distal internal carotid diameter as the denominator. CONTRAST:  65mL OMNIPAQUE IOHEXOL 350 MG/ML SOLN COMPARISON:  Prior head CT from earlier the same day. FINDINGS: CTA NECK FINDINGS Aortic arch: Visualized aortic arch of normal caliber with normal 3 vessel morphology. Mild atheromatous change about the arch and origin of the great vessels without hemodynamically significant stenosis. Right carotid system: Right CCA regular but patent to the bifurcation without flow-limiting stenosis. Bulky calcified plaque about the right carotid bulb/proximal right ICA with associated stenosis of up to approximate 50% by NASCET criteria. Right ICA patent distally to the skull base without stenosis, dissection or occlusion. Left carotid system: Left CCA patent from its origin to the bifurcation. Vascular stent traverses the left carotid bifurcation and extends into the proximal left ICA. Widely patent flow through the stent. Left ICA widely patent distally without stenosis, evidence for dissection or occlusion. Vertebral arteries: Both vertebral arteries arise from the subclavian arteries. Vertebral arteries patent within the neck without stenosis, dissection or occlusion. Skeleton: No acute osseous finding. No discrete or worrisome osseous lesions. Congenital fusion of the C6 and  C7 vertebral bodies noted. Moderate spondylosis noted elsewhere within the cervical spine without high-grade stenosis. Other neck: No other acute soft tissue abnormality within the neck. No mass or adenopathy. Upper chest: Visualized upper chest demonstrates no acute finding. Review of the MIP images confirms the above findings CTA HEAD FINDINGS Anterior circulation: Petrous segments patent  bilaterally. Scattered atheromatous plaque throughout the carotid siphons with associated mild to moderate multifocal narrowing. A1 segments patent bilaterally. Normal anterior communicating artery complex. Anterior cerebral arteries patent to their distal aspects without high-grade stenosis. No M1 stenosis or occlusion. Negative MCA bifurcations. No visible proximal MCA branch occlusion. Distal left MCA branches somewhat small and atrophic in appearance related to the chronic left MCA distribution infarct. Diffuse small vessel atheromatous irregularity. Posterior circulation: Both V4 segments patent to the vertebrobasilar junction without high-grade stenosis. Both PICA origins patent and normal. Basilar irregular but patent to its distal aspect without significant stenosis. Superior cerebellar arteries patent bilaterally. Right PCA supplied via the basilar. Predominant fetal type origin of the left PCA. Both PCAs remain patent to their distal aspects without stenosis. Venous sinuses: Grossly patent allowing for timing the contrast bolus. Anatomic variants: Predominant fetal type origin of the left PCA. No aneurysm. Review of the MIP images confirms the above findings IMPRESSION: 1. Negative CTA for emergent large vessel occlusion. 2. 50% atheromatous stenosis at the origin of the right ICA. 3. Widely patent left carotid stent. 4. Moderate intracranial atherosclerotic disease without proximal high-grade or correctable stenosis. Critical Value/emergent results were called by telephone at the time of interpretation on 04/28/2020 at 8:58 pm to provider [facility] , who verbally acknowledged these results. Electronically Signed   By: Jeannine Boga M.D.   On: 04/28/2020 21:18    PHYSICAL EXAM   General: Laying comfortably in bed; in no acute distress. HENT: Normal oropharynx and mucosa. Normal external appearance of ears and nose. Neck: Supple, no pain or tenderness CV: No JVD. No peripheral  edema. Pulmonary: Symmetric Chest rise. Normal respiratory effort. Abdomen: Soft to touch, non-tender. Ext: No cyanosis, edema, or deformity Skin: No rash. Normal palpation of skin.  Musculoskeletal: Normal digits and nails by inspection. No clubbing.  Neurologic Examination  Mental status/Cognition: Alert, oriented to self, but not to place, month and year. Speech/language: Non fluent, dysarthric speech. Getting stuck on several words, comprehension intact to simple commands, only able to name a couple objects. Unable to repeat. Cranial nerves:   CN II Pupils equal and reactive to light, no VF deficits   CN III,IV,VI EOM intact, no gaze preference or deviation, no nystagmus   CN V normal sensation in V1, V2, and V3 segments bilaterally   CN VII R facial droop.   CN VIII normal hearing to speech   CN IX & X normal palatal elevation, no uvular deviation   CN XI    CN XII midline tongue protrusion   Motor:  Muscle bulk: poor, tone  slightly increased on the right., pronator drift none tremor none mild right hand and grip weakness.  Diminished fine finger movements on the right.  Orbits left over right upper extremity.  Mild weakness of right hip flexors and ankle dorsiflexors.  Sensation intact to light touch  Coordination/Complex Motor:  - Finger to Nose with RUE ataxia. - Heel to shin with RLE ataxia. - Gait: Deferred. Stroke History  Stroke history per Dr. Clydene Fake clinic note 04/18/2020 "She presented initially on 08/12/2019 as a code stroke with confusion for 5 days prior to admission.  MRI scan showed left MCA subacute infarct and carotid ultrasound showed 80 to 99% left ICA stenosis. CT angiogram showed near occlusion of the left carotid at the origin of the internal carotid.  2D echo showed linear mobile density on the aortic valve and TEE was suggested but was recommended to be done as an outpatient.  LDL cholesterol 158 mg percent.  Hemoglobin A1c was 8.2.  Urine drug screen  was positive for cannabis.  She was started on aspirin and Plavix for 3 months given his intracranial stenosis with CT angiogram showing slow flow in the left M2, distal left MCA vessels and left M2 branch occlusion and left M1 moderate stenosis.    She underwent left carotid TCAR procedure with stenting on 08/20/2019 by vascular surgery.  On 09/18/2019 with sudden onset of right facial droop and mild expressive aphasia and MRI scan showed small acute infarct in the posterior medial left thalamus.  NIH stroke scale was 5 but most of the deficit appeared to be old from previous stroke.  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  Lower extremity venous Doppler showed bilateral DVTs.  Patient was initially started on IV heparin but was stopped for the bladder cancer surgery and plan was to switch to Eliquis on discharge.  Patient however had IVC filter placed.  She now states that she has plans to have the filter removed and in a few weeks."  ASSESSMENT/PLAN Lisa [name] is a 70 y.o. 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 Given significant R leg incoordination, tPA was given. Not a candidate for thrombectomy. EEG also ordered and loaded with Keppra given she had a ?episode of unresponsiveness for 10 secs. Was not post ictal on arrival to the ED though. NIHSS 9  tPA given at 2051.   Stroke like episode with worsening of old deficits versus new stroke with extension.  Status post treatment with IV TPA. Initial Code stroke CTH -  No acute findings  CTA head & neck -No LVO, patent left carotid stent. Moderate intracranial  atherosclerotic disease without proximal high-grade stenosis -MRI   Pending  -2D Echo -EF 60 to 65%, No thrombus, wall motion abnormality or shunt found. Grade I diastolic dysfunction was noted.  EEG -cortical dysfunction arising from left frontotemporal region, nonspecific etiology, likely secondary to underlying encephalomalacia.  No seizures or epileptiform discharges   LDL 42  HgbA1c 6.0  VTE prophylaxis - consider 3/19 if hgb remains stable      Diet   Diet Carb Modified Fluid consistency: Thin; Room service appropriate? Yes with Assist   s/p tPA monitoring of bleeding risk:  Hx of bladder cancer w/associated hematuria in July 2021. Monitoring hgb q 6 hours. 11.5->10.8->10.9  Therapy recommendations:  TBD  Disposition:  TBD  Hypertension  Home meds:  Norvasc 10mg , Cozaar 25mg  daily,   Stable . Permissive hypertension (OK if < 220/120) but gradually normalize in 5-7 days . Long-term BP goal normotensive  Hyperlipidemia  Home meds:  Lipitor 20mg , resumed in hospital  LDL 42, goal < 70  High intensity statin not required as  patient is at goal  Continue statin at discharge  Diabetes type II controlled  Home meds:  Metformin  HgbA1c 6.0, goal < 7.0  CBGs Recent Labs    04/28/20 2030 04/28/20 2339 04/29/20 0717  GLUCAP 127* 137* 111*      SSI  Will obtain CXR, UA to evaluate for any acute infection which may have re-activated stroke symptoms    Other Stroke Risk Factors  Advanced Age >/= 75   Former Cigarette smoker, quit 2021  Hx stroke/TIA  Coronary artery disease  Other Active Problems    Hospital day # 1  I have personally obtained history,examined this patient, reviewed notes, independently viewed imaging studies, participated in medical decision making and plan of care.ROS completed by me personally and pertinent positives fully documented  I have made any additions or clarifications directly to the above note. Agree with note above.   She presented with transient worsening of her old symptoms preceded by a brief episode of loss of consciousness and fall.  Unclear if this represents a new  stroke or unwitnessed seizure with postictal state.  Recommend check MRI scan of the brain, EEG.  Continue ongoing stroke work-up.  Strict blood pressure control and close neurological monitoring as per post TPA protocol.  On discussion with patient and husband and answered questions. This patient is critically ill and at significant risk of neurological worsening, death and care requires constant monitoring of vital signs, hemodynamics,respiratory and cardiac monitoring, extensive review of multiple databases, frequent neurological assessment, discussion with family, other specialists and medical decision making of high complexity.I have made any additions or clarifications directly to the above note.This critical care time does not reflect procedure time, or teaching time or supervisory time of PA/NP/Med Resident etc but could involve care discussion time.  I spent 30 minutes of neurocritical care time  in the care of  this patient.     Antony Contras, MD Medical Director [place] Pager: [phone] 04/29/2020 4:24 PM   To contact Stroke Continuity provider, please refer to [email or web address] After hours, contact General Neurology

## 2020-04-29 NOTE — Progress Notes (Signed)
EEG complete - results pending 

## 2020-04-29 NOTE — Progress Notes (Signed)
  Echocardiogram 2D Echocardiogram has been performed.  Johny Chess 04/29/2020, 10:46 AM

## 2020-04-30 ENCOUNTER — Inpatient Hospital Stay (HOSPITAL_COMMUNITY): Payer: Medicare Other

## 2020-04-30 DIAGNOSIS — Z86718 Personal history of other venous thrombosis and embolism: Secondary | ICD-10-CM

## 2020-04-30 DIAGNOSIS — Z8673 Personal history of transient ischemic attack (TIA), and cerebral infarction without residual deficits: Secondary | ICD-10-CM

## 2020-04-30 DIAGNOSIS — I610 Nontraumatic intracerebral hemorrhage in hemisphere, subcortical: Secondary | ICD-10-CM

## 2020-04-30 DIAGNOSIS — R55 Syncope and collapse: Secondary | ICD-10-CM

## 2020-04-30 DIAGNOSIS — I609 Nontraumatic subarachnoid hemorrhage, unspecified: Secondary | ICD-10-CM

## 2020-04-30 LAB — CBC
HCT: 32.5 % — ABNORMAL LOW (ref 36.0–46.0)
Hemoglobin: 10.3 g/dL — ABNORMAL LOW (ref 12.0–15.0)
MCH: 27.3 pg (ref 26.0–34.0)
MCHC: 31.7 g/dL (ref 30.0–36.0)
MCV: 86.2 fL (ref 80.0–100.0)
Platelets: 183 10*3/uL (ref 150–400)
RBC: 3.77 MIL/uL — ABNORMAL LOW (ref 3.87–5.11)
RDW: 15.5 % (ref 11.5–15.5)
WBC: 6.6 10*3/uL (ref 4.0–10.5)
nRBC: 0 % (ref 0.0–0.2)

## 2020-04-30 LAB — BASIC METABOLIC PANEL
Anion gap: 7 (ref 5–15)
BUN: 8 mg/dL (ref 8–23)
CO2: 25 mmol/L (ref 22–32)
Calcium: 9.4 mg/dL (ref 8.9–10.3)
Chloride: 103 mmol/L (ref 98–111)
Creatinine, Ser: 0.54 mg/dL (ref 0.44–1.00)
GFR, Estimated: >60 mL/min (ref >60)
Glucose, Bld: 115 mg/dL — ABNORMAL HIGH (ref 70–99)
Potassium: 3.6 mmol/L (ref 3.5–5.1)
Sodium: 135 mmol/L (ref 135–145)

## 2020-04-30 LAB — GLUCOSE, CAPILLARY
Glucose-Capillary: 120 mg/dL — ABNORMAL HIGH (ref 70–99)
Glucose-Capillary: 203 mg/dL — ABNORMAL HIGH (ref 70–99)
Glucose-Capillary: 122 mg/dL — ABNORMAL HIGH (ref 70–99)
Glucose-Capillary: 135 mg/dL — ABNORMAL HIGH (ref 70–99)

## 2020-04-30 IMAGING — CT CT HEAD W/O CM
4 series · 16 of 47 positions shown, 18 images · non-contrast
Comparison: MRI and CT yesterday.

CLINICAL DATA: Follow-up intracranial hemorrhage

EXAM:
CT HEAD WITHOUT CONTRAST
TECHNIQUE: Contiguous axial images were obtained from the base of the skull
through the vertex without intravenous contrast.

[Series 3: head wo · axial · 0.44mm/px · z∈[-166,-46]mm · 7 of 34 slices shown, 9 images]
[im 5/34  brain]
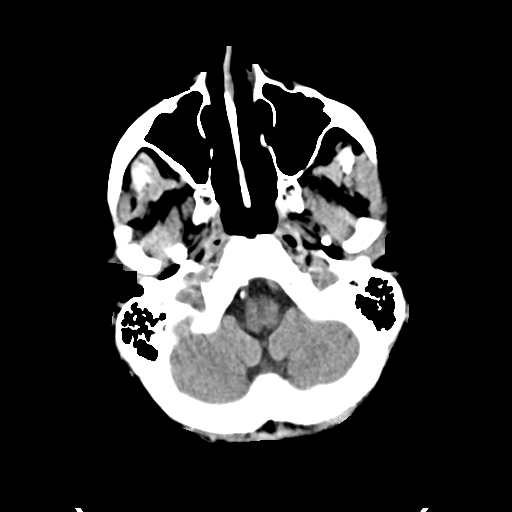
[im 5/34  bone]
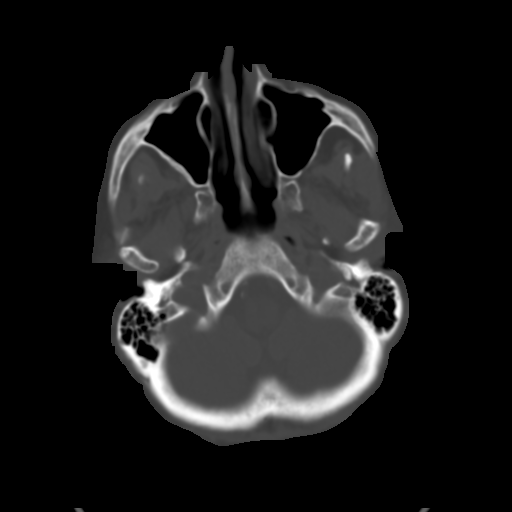
[im 9/34  brain]
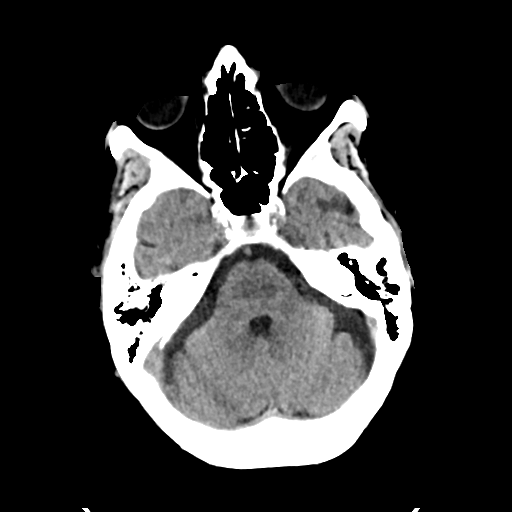
[im 13/34  brain]
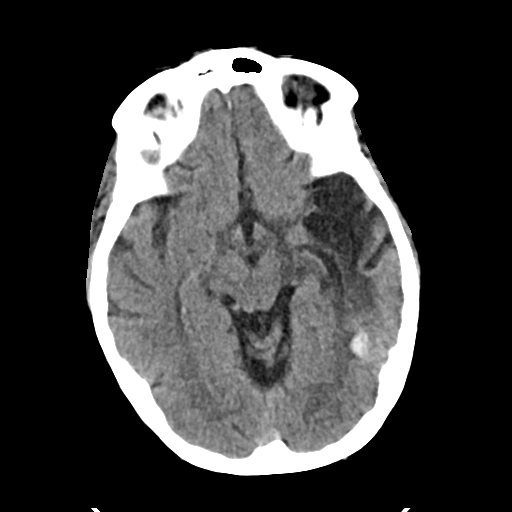
[im 17/34  brain]
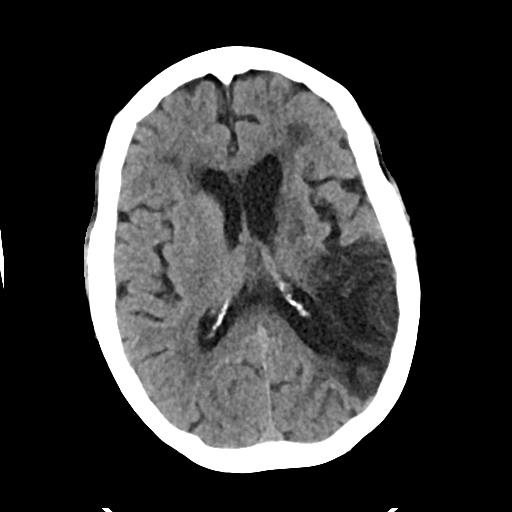
[im 21/34  brain]
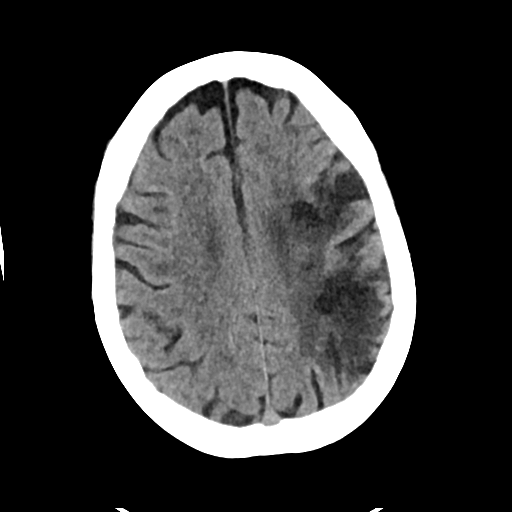
[im 21/34  bone]
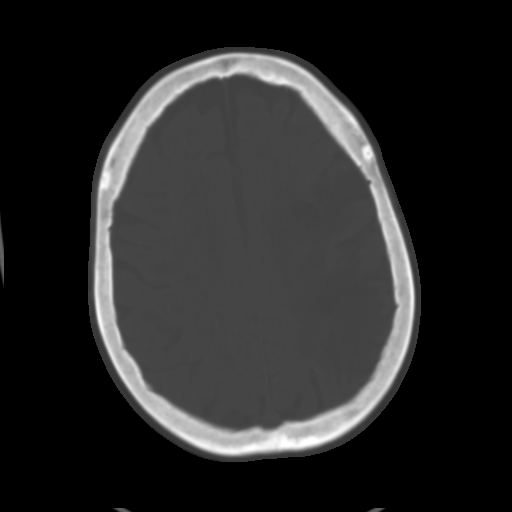
[im 25/34  brain]
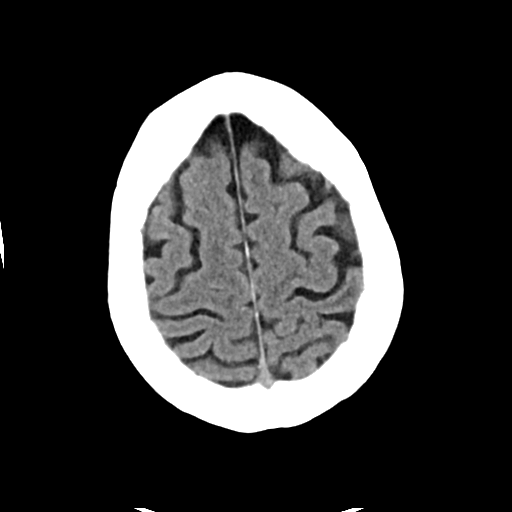
[im 29/34  brain]
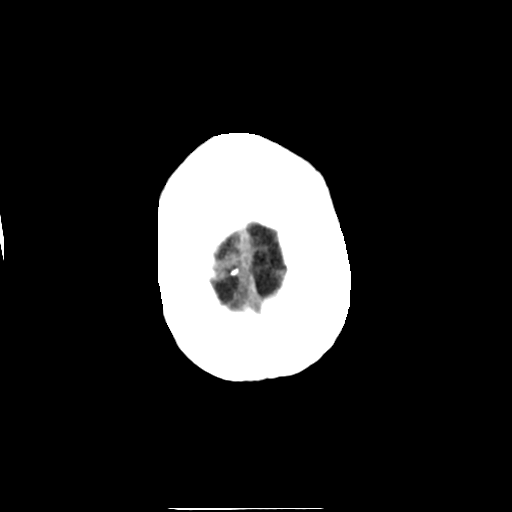

[Series 4: head bone · axial · 0.44mm/px · z∈[-170,-138]mm · 3 of 83 slices shown]
[im 9/83  bone]
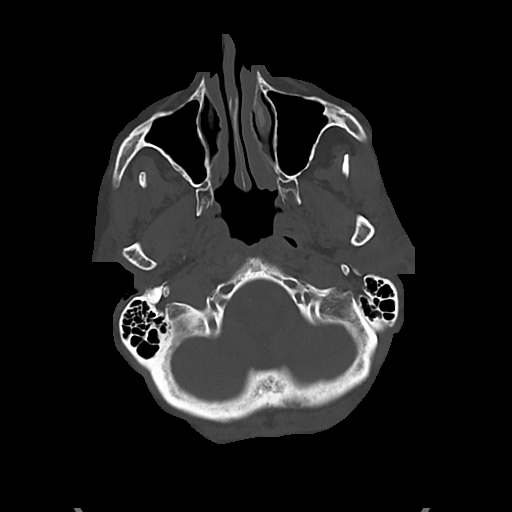
[im 17/83  bone]
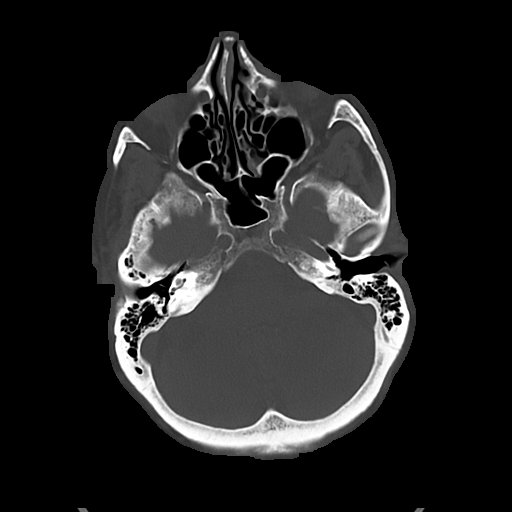
[im 25/83  bone]
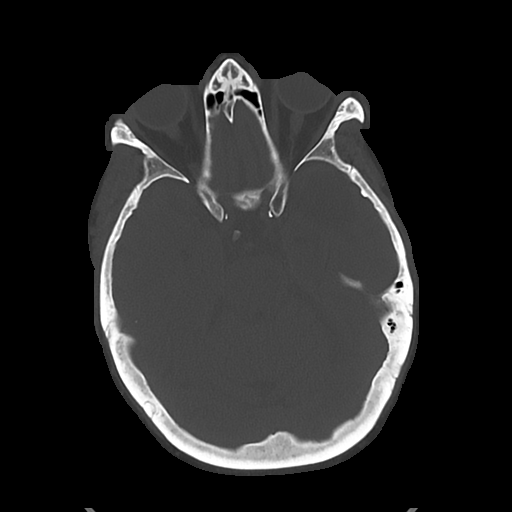

[Series 5: cor soft · coronal · 0.32mm/px · 3 of 70 slices shown]
[im 24/70  brain]
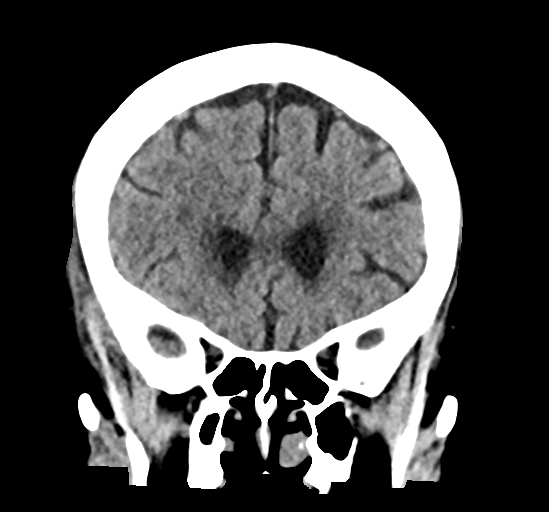
[im 31/70  brain]
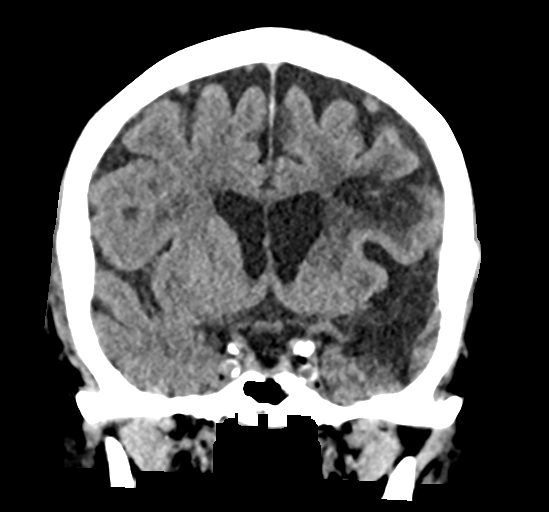
[im 39/70  brain]
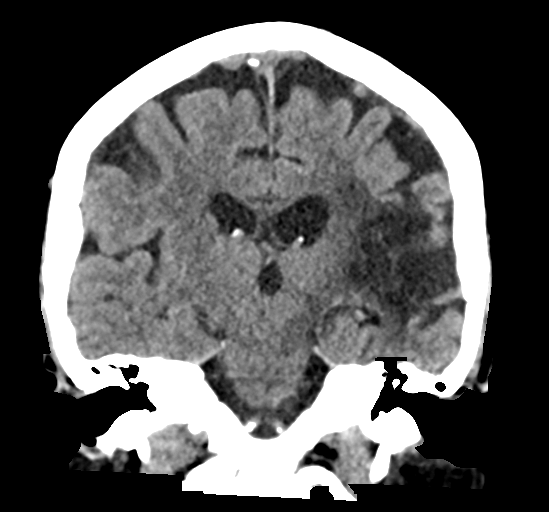

[Series 6: sag soft · sagittal · 0.32mm/px · 3 of 60 slices shown]
[im 20/60  brain]
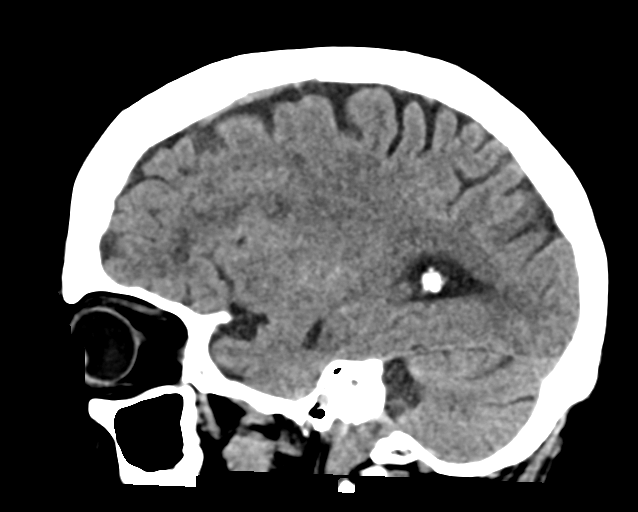
[im 30/60  brain]
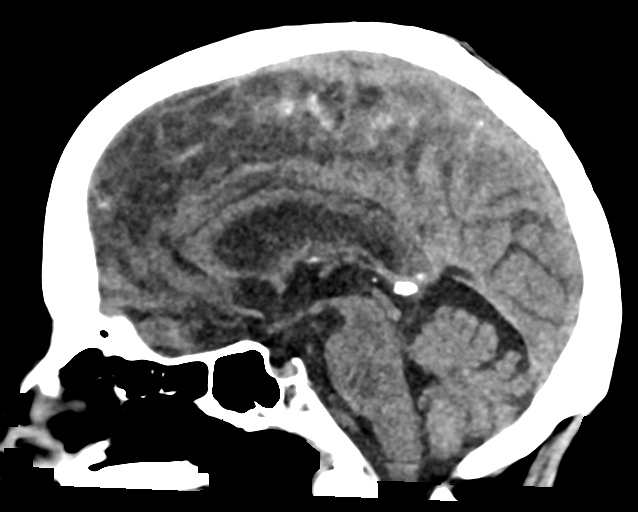
[im 40/60  brain]
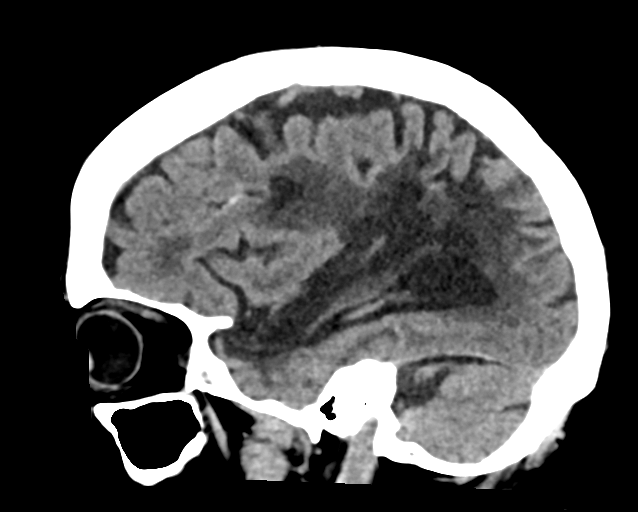

[16 of 47 positions shown; findings below may reference images not displayed]

FINDINGS: Brain: No worsening or new finding since yesterday. Hemorrhagic
stroke or contusion in the left posterior temporal low again
demonstrated, maximal dimension of the blood 11 mm today. Small
amount of surrounding edema. Small amount of hemorrhage in the
perimesencephalic cistern on the right also measures a mm or 2
smaller. The presence of these 2 abnormalities suggests the
possibility head trauma.

Old infarction in the left MCA territory is unchanged. Old small
vessel infarctions in the left basal ganglia and in bilateral
cerebral hemispheric white matter appear unchanged. Ventricular size
is stable. No subdural collection.

Vascular: There is atherosclerotic calcification of the major
vessels at the base of the brain.

Skull: Negative

Sinuses/Orbits: Clear/normal

Other: None
IMPRESSION: 1. No worsening or new finding since yesterday. Hemorrhagic stroke
or contusion in the left posterior temporal lobe, maximal dimension
of 11 mm today. Small amount of surrounding edema. Small amount of
hemorrhage in the perimesencephalic cistern on the right also
measures a mm or 2 smaller. The presence of these 2 abnormalities
suggests the possibility of head trauma.
2. Old left MCA territory infarction. Old small vessel infarctions
in the left basal ganglia and bilateral cerebral hemispheric white
matter.

## 2020-04-30 MED ORDER — ENOXAPARIN SODIUM 40 MG/0.4ML ~~LOC~~ SOLN
40.0000 mg | SUBCUTANEOUS | Status: DC
  Administered 2020-04-30: 40 mg via SUBCUTANEOUS
  Filled 2020-04-30: qty 0.4

## 2020-04-30 MED ORDER — LOSARTAN POTASSIUM 25 MG PO TABS
25.0000 mg | ORAL_TABLET | Freq: Every day | ORAL | Status: DC
  Administered 2020-04-30 – 2020-05-02 (×3): 25 mg via ORAL
  Filled 2020-04-30 (×3): qty 1

## 2020-04-30 MED ORDER — PANTOPRAZOLE SODIUM 40 MG PO TBEC
40.0000 mg | DELAYED_RELEASE_TABLET | Freq: Every day | ORAL | Status: DC
  Administered 2020-04-30: 40 mg via ORAL
  Filled 2020-04-30: qty 1

## 2020-04-30 MED ORDER — LABETALOL HCL 5 MG/ML IV SOLN: 5.0000 mg | INTRAVENOUS | Status: DC | PRN

## 2020-04-30 NOTE — Progress Notes (Signed)
Patient frequently refusing BP despite education and encouragement from nursing staff. BP this AM 166/79. Goal SBP <140. Patient educated about importance of blood pressure monitoring and Labetalol medication with good understanding verbalized. Labetalol given with repeat pressure 130/75 HR 70

## 2020-04-30 NOTE — Progress Notes (Signed)
Brief neuro update:  Reviewed post tPA CTH w/o contrast with new 1.10mc IPH and an additional 17mm hemorrhage at the R perimesencephalic cistern. Will hold off on Aspirin and DVT prophylaxis right now.  Recs: - repeat CTH ordered - SBP < 140, labetalol PRN for SBP > 140. - Hold off on Asa and DVT prophylaxis for now.  [place] Pager Number [phone]

## 2020-04-30 NOTE — Progress Notes (Addendum)
STROKE TEAM PROGRESS NOTE    Brief HPI:  She had a brief episode of loss of consciousness 3/17 with LOC for several seconds, husband heard thud and found her on floor. Once she became responsive again she was confused with impaired speech and worsening of her existing old right sided weakness. She was brought to the ED by EMS. S/p tPA 2050 3/17. Initial imaging negative.   INTERVAL HISTORY Neurologically stable . Still with adequate blood pressure control. Tmax 99.  Denying and new problems or concerns. She would like to go home as soon as possible.  No visitors at bedside. Stroke work up and ongoing plan of care discussed with patient and husband at bedside.   Vitals:   04/30/20 0700 04/30/20 0755 04/30/20 0800 04/30/20 0811  BP: (!) 148/91 (!) 166/79 (!) 166/79   Pulse:  73 82 65  Resp:  (!) 27 19 19   Temp:      TempSrc:      SpO2:  96% 98% 94%  Weight:      Height:       CBC:  Recent Labs  Lab 04/28/20 2033 04/28/20 2038 04/29/20 1556 04/30/20 0236  WBC 9.8  --   --  6.6  NEUTROABS 5.5  --   --   --   HGB 12.2   < > 11.5* 10.3*  HCT 39.7   < > 35.9* 32.5*  MCV 88.8  --   --  86.2  PLT 236  --   --  183   < > = values in this interval not displayed.   Basic Metabolic Panel:  Recent Labs  Lab 04/29/20 0240 04/30/20 0236  NA 137 135  K 3.8 3.6  CL 106 103  CO2 22 25  GLUCOSE 131* 115*  BUN 13 8  CREATININE 0.59 0.54  CALCIUM 9.2 9.4   Lipid Panel:  Recent Labs  Lab 04/29/20 0240  CHOL 127  TRIG 77  HDL 70  CHOLHDL 1.8  VLDL 15  LDLCALC 42   HgbA1c:  Recent Labs  Lab 04/29/20 0240  HGBA1C 6.0*   Urine Drug Screen:  Recent Labs  Lab 04/29/20 1407  LABOPIA NONE DETECTED  COCAINSCRNUR NONE DETECTED  LABBENZ NONE DETECTED  AMPHETMU NONE DETECTED  THCU NONE DETECTED  LABBARB NONE DETECTED    Alcohol Level  Recent Labs  Lab 04/28/20 2033  ETH 147*    IMAGING past 24 hours CT HEAD WO CONTRAST  Result Date: 04/29/2020 CLINICAL DATA:   Follow-up examination for acute stroke, cerebral hemorrhage suspected. EXAM: CT HEAD WITHOUT CONTRAST TECHNIQUE: Contiguous axial images were obtained from the base of the skull through the vertex without intravenous contrast. COMPARISON:  Prior MRI from earlier the same day. FINDINGS: Brain: Atrophy with chronic small vessel ischemic disease again noted. Chronic encephalomalacia related to a remote left MCA territory infarct again noted. There is a new 1.5 cm intraparenchymal hemorrhage positioned slightly posteriorly and inferiorly to the chronic left MCA distribution infarct at the posterior left temporal occipital region (series 3, image 12). Minimal surrounding vasogenic edema without significant regional mass effect. Additional 8 mm hemorrhage present at the right perimesencephalic cistern (series 11, image 11). No associated hemorrhage. Findings presumably related to prior tPA administration. No other visible hemorrhage. Probable trace laminar necrosis at the anterior margin of the chronic left MCA distribution infarct again noted. No visible acute or vaulting large vessel territory infarct. No mass lesion, mass effect, or midline shift. No hydrocephalus or extra-axial fluid  collection. Vascular: No hyperdense vessel. Skull: Scalp soft tissues and calvarium within normal limits. Sinuses/Orbits: Globes and orbital soft tissues demonstrate no acute finding. Paranasal sinuses and mastoid air cells remain clear. Other: 9. IMPRESSION: 1. New 1.5 cm intraparenchymal hemorrhage positioned slightly posteriorly and inferiorly to the chronic left MCA distribution infarct. Minimal surrounding vasogenic edema without significant regional mass effect. 2. Additional 8 mm hemorrhage at the right perimesencephalic cistern. 3. No other new acute intracranial abnormality. 4. Underlying atrophy with chronic small vessel ischemic disease with chronic left MCA territory infarct. Critical Value/emergent results were called by  telephone at the time of interpretation on 04/29/2020 at 10:07 pm to provider [facility] , who verbally acknowledged these results. Electronically Signed   By: Jeannine Boga M.D.   On: 04/29/2020 22:08   MR BRAIN WO CONTRAST  Result Date: 04/29/2020 CLINICAL DATA:  Right-sided leg weakness. EXAM: MRI HEAD WITHOUT CONTRAST TECHNIQUE: Multiplanar, multiecho pulse sequences of the brain and surrounding structures were obtained without intravenous contrast. COMPARISON:  CT studies yesterday.  MRI 08/12/2019. FINDINGS: Brain: Chronic small-vessel ischemic changes affect pons. Changes of Wallerian degeneration on the left. No focal cerebellar finding. Right cerebral hemisphere shows an old small vessel infarction in the thalamus and moderate chronic small-vessel changes of the white matter. Left cerebral hemisphere shows old infarction in the left middle cerebral artery territory with atrophy, encephalomalacia and gliosis affecting temporal lobe and parietal lobe. Some posterior frontal involvement as well. There are areas of hemosiderin deposition, most prominent at the posterior temporal lobe. I favor that this is related to the old infarction, though there is some potential there could be a more recent hemorrhage in that location, with mild surrounding edema. No obstructive hydrocephalus. Some ex vacuo enlargement of the left lateral ventricle. No extra-axial collection. Vascular: Major vessels at the base of the brain show flow. Skull and upper cervical spine: Negative Sinuses/Orbits: Clear/normal Other: None IMPRESSION: 1. Old infarction in the left middle cerebral artery territory. Atrophy, encephalomalacia and gliosis. Hemosiderin deposition within the region of old infarction. There is a small focus of more prominent hemosiderin deposition at the posterior temporal lobe which could possibly be a more recent hemorrhage/hemorrhagic infarction, surrounded by a small bit of edema. This is located a  little bit below and posterior to the old infarction. Because of the susceptibility artifact, I cannot accurately evaluate for restricted diffusion. No gross blood is seen in this location on the CT scan. 2. Chronic small-vessel ischemic changes elsewhere throughout the brain as outlined above. Electronically Signed   By: Nelson Chimes M.D.   On: 04/29/2020 18:10   DG CHEST PORT 1 VIEW  Result Date: 04/29/2020 CLINICAL DATA:  Syncope earlier today. EXAM: PORTABLE CHEST 1 VIEW COMPARISON:  Radiograph 10/05/2019 FINDINGS: The cardiomediastinal contours are normal. The lungs are clear. Pulmonary vasculature is normal. No consolidation, pleural effusion, or pneumothorax. Thoracic spine degenerative change. No acute osseous abnormalities are seen. IMPRESSION: No acute chest findings. Electronically Signed   By: Keith Rake M.D.   On: 04/29/2020 18:21   ECHOCARDIOGRAM COMPLETE  Result Date: 04/29/2020    ECHOCARDIOGRAM REPORT   Patient Name:   Lisa [name] [facility] Date of Exam: 04/29/2020 Medical Rec #:  [id number]         Height:       66.0 in Accession #:    [id number]        Weight:       164.5 lb Date of Birth:  Oct 17, 1950  BSA:          1.840 m Patient Age:    38 years          BP:           141/71 mmHg Patient Gender: F                 HR:           88 bpm. Exam Location:  Inpatient Procedure: 2D Echo Indications:    stroke  History:        Patient has prior history of Echocardiogram examinations, most                 recent 09/19/2019. Cancer; Risk Factors:Hypertension and                 Dyslipidemia.  Sonographer:    Johny Chess Referring Phys: [id number] [place]  1. Left ventricular ejection fraction, by estimation, is 60 to 65%. The left ventricle has normal function. The left ventricle has no regional wall motion abnormalities. Left ventricular diastolic parameters are consistent with Grade I diastolic dysfunction (impaired relaxation).  2. Right ventricular systolic  function is normal. The right ventricular size is mildly enlarged. There is normal pulmonary artery systolic pressure. The estimated right ventricular systolic pressure is 15.4 mmHg.  3. The mitral valve is normal in structure. Trivial mitral valve regurgitation. No evidence of mitral stenosis.  4. The aortic valve is grossly normal. There is mild calcification of the aortic valve. Aortic valve regurgitation is not visualized. No aortic stenosis is present.  5. The inferior vena cava is normal in size with greater than 50% respiratory variability, suggesting right atrial pressure of 3 mmHg. Conclusion(s)/Recommendation(s): No intracardiac source of embolism detected on this transthoracic study. A transesophageal echocardiogram is recommended to exclude cardiac source of embolism if clinically indicated. FINDINGS  Left Ventricle: Left ventricular ejection fraction, by estimation, is 60 to 65%. The left ventricle has normal function. The left ventricle has no regional wall motion abnormalities. The left ventricular internal cavity size was normal in size. There is  no left ventricular hypertrophy. Left ventricular diastolic parameters are consistent with Grade I diastolic dysfunction (impaired relaxation). Right Ventricle: The right ventricular size is mildly enlarged. No increase in right ventricular wall thickness. Right ventricular systolic function is normal. There is normal pulmonary artery systolic pressure. The tricuspid regurgitant velocity is 2.39  m/s, and with an assumed right atrial pressure of 3 mmHg, the estimated right ventricular systolic pressure is 00.8 mmHg. Left Atrium: Left atrial size was normal in size. Right Atrium: Right atrial size was normal in size. Pericardium: There is no evidence of pericardial effusion. Mitral Valve: The mitral valve is normal in structure. Trivial mitral valve regurgitation. No evidence of mitral valve stenosis. Tricuspid Valve: The tricuspid valve is normal in  structure. Tricuspid valve regurgitation is trivial. No evidence of tricuspid stenosis. Aortic Valve: Nodular, nonmobile calcification on non coronary cusp (probable calcified node of Arantius). The aortic valve is grossly normal. There is mild calcification of the aortic valve. Aortic valve regurgitation is not visualized. No aortic stenosis is present. Pulmonic Valve: The pulmonic valve was grossly normal. Pulmonic valve regurgitation is trivial. No evidence of pulmonic stenosis. Aorta: The aortic root is normal in size and structure. Venous: The inferior vena cava is normal in size with greater than 50% respiratory variability, suggesting right atrial pressure of 3 mmHg. IAS/Shunts: No atrial level shunt detected by color flow Doppler.  LEFT  VENTRICLE PLAX 2D LVIDd:         4.60 cm  Diastology LVIDs:         3.30 cm  LV e' medial:  6.74 cm/s LV PW:         1.00 cm  LV e' lateral: 9.03 cm/s LV IVS:        0.80 cm LVOT diam:     2.00 cm LV SV:         79 LV SV Index:   43 LVOT Area:     3.14 cm  RIGHT VENTRICLE             IVC RV S prime:     20.90 cm/s  IVC diam: 1.40 cm TAPSE (M-mode): 2.8 cm LEFT ATRIUM             Index       RIGHT ATRIUM           Index LA diam:        3.00 cm 1.63 cm/m  RA Area:     12.00 cm LA Vol (A2C):   49.0 ml 26.63 ml/m RA Volume:   25.30 ml  13.75 ml/m LA Vol (A4C):   37.3 ml 20.27 ml/m LA Biplane Vol: 44.0 ml 23.91 ml/m  AORTIC VALVE LVOT Vmax:   113.00 cm/s LVOT Vmean:  86.600 cm/s LVOT VTI:    0.253 m  AORTA Ao Root diam: 3.00 cm Ao Asc diam:  3.40 cm TRICUSPID VALVE TR Peak grad:   22.8 mmHg TR Vmax:        239.00 cm/s  SHUNTS Systemic VTI:  0.25 m Systemic Diam: 2.00 cm Cherlynn Kaiser MD Electronically signed by Cherlynn Kaiser MD Signature Date/Time: 04/29/2020/2:07:20 PM    Final     PHYSICAL EXAM  General: Elderly female lying in bed in NAD.  HENT: Normal oropharynx and mucosa. Normal external appearance of ears and nose. CV: regular rhythm on  telemetry Pulmonary: Symmetric Chest rise. Normal respiratory effort. Abdomen: Soft to touch, non-tender. Skin: No rash, warm and dry   Neurologic Examination  Mental status/Cognition: Alert, oriented x4 Speech/language: Clear, somewhat hesitant at times. Able to name 2/2 objects. Able to repeat 3 word phrase.  Cranial nerves:   CN II Pupils equal and reactive to light, no VF deficits   CN III,IV,VI EOM intact, no gaze preference or deviation, no nystagmus   CN V normal sensation in V1, V2, and V3 segments bilaterally   CN VII R facial droop.   CN VIII normal hearing to speech   CN IX & X normal palatal elevation,    CN XI    CN XII midline tongue protrusion   Motor:  Muscle bulk: poor, tone  slightly increased on the right., pronator drift none tremor none mild right hand and grip weakness.  Diminished fine finger movements on the right.  Orbits left over right upper extremity.  Mild weakness of right hip flexors and ankle dorsiflexors.  Sensation intact to light touch x 4 extremities  Coordination/Complex Motor:  - Finger to Nose with RUE ataxia. - Heel to shin with RLE ataxia. - Gait: Deferred.  Stroke history per Dr. Clydene Fake clinic note 04/18/2020 "She presented initially on 08/12/2019 as a code stroke with confusion for 5 days prior to admission.  MRI scan showed left MCA subacute infarct and carotid ultrasound showed 80 to 99% left ICA stenosis. CT angiogram showed near occlusion of the left carotid at the origin of the internal carotid.  2D echo showed linear mobile density on the aortic valve and TEE was suggested but was recommended to be done as an outpatient.  LDL cholesterol 158 mg percent.  Hemoglobin A1c was 8.2.  Urine drug screen was positive for cannabis.  She was started on aspirin and Plavix for 3 months given his intracranial stenosis with CT angiogram showing slow flow in the left M2, distal left MCA vessels and left M2 branch occlusion and left M1 moderate stenosis.     She underwent left carotid TCAR procedure with stenting on 08/20/2019 by vascular surgery.  On 09/18/2019 with sudden onset of right facial droop and mild expressive aphasia and MRI scan showed small acute infarct in the posterior medial left thalamus.  NIH stroke scale was 5 but most of the deficit appeared to be old from previous stroke.  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  Lower extremity venous Doppler showed bilateral DVTs.  Patient was initially started on IV heparin but was stopped for the bladder cancer surgery and plan was to switch to Eliquis on discharge.  Patient however had IVC filter placed.  She now states that she has plans to have the filter removed and in a few weeks."  ASSESSMENT/PLAN Lisa [name] is a 70 y.o. 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 Given significant R leg incoordination, tPA was given. Not a candidate for thrombectomy. EEG also ordered and loaded with Keppra given she had a ?episode of unresponsiveness for 10 secs. Was not post ictal on arrival to the ED though. NIHSS 9  tPA given at 2051.   New 1.13mc IPH and an additional 60mm hemorrhage at the R perimesencephalic cistern s/p tPA.   Initial Code stroke CTH -  No acute findings   Repeat [facility] 3/18 2200 -New 1.66mc IPH and an additional 78mm hemorrhage at the R perimesencephalic cistern.  CTA head & neck -No LVO, patent left carotid stent. Moderate intracranial atherosclerotic disease without proximal high-grade stenosis  -MR Brain   3/18 1810 Old infarction in the left middle cerebral artery territory. Atrophy,  encephalomalacia and gliosis. Hemosiderin deposition within the region of old infarction. There is a small focus of more prominent hemosiderin deposition at the posterior temporal lobe possible recenthemorrhage/hemorrhagic infarction, surrounded by a small bit of edema. This is located a little bit below and posterior to the old infarction. Because of the susceptibility artifact, I cannot accurately evaluate for restricted diffusion. No gross blood is seen in this location on the CT scan.Chronic SVD.  -2D Echo -EF 60 to 65%, No thrombus, wall motion abnormality or shunt found. Grade I diastolic dysfunction was noted.  EEG -cortical dysfunction arising from left frontotemporal region, nonspecific etiology, likely secondary to underlying encephalomalacia.  No seizures or epileptiform discharges   LDL 42  HgbA1c 6.0  VTE prophylaxis - consider 3/19 if hgb remains stable      Diet   Diet Carb Modified Fluid consistency: Thin; Room service appropriate? Yes with Assist   s/p tPA monitoring of bleeding risk:  Hx of bladder cancer w/associated hematuria in July 2021. Monitoring hgb  q 6 hours. 11.5->10.8->10.9->10.3  Therapy recommendations:  CIR  Disposition:  CIR  Transfer to floor when bed available   Hypertension  Home meds:  Norvasc 10mg , Cozaar 25mg  daily,   Stable, on home medications  SBP goal< 140  Hyperlipidemia  Home meds:  Lipitor 20mg , resumed in hospital  LDL 42, goal < 70  High intensity statin not required as patient is at goal  Continue statin at discharge  Diabetes type II controlled  Home meds:  Metformin  HgbA1c 6.0, goal < 7.0  CBGs Recent Labs    04/29/20 1618 04/29/20 2207 04/30/20 0710  GLUCAP 162* 106* 120*      SSI  Concern for seizure  EEG without seizure  No further seizure like activity  No indication for AED at present   Other Stroke Risk Factors  Advanced Age >/= 61   Former Cigarette smoker, quit 2021  Hx  stroke/TIA  Coronary artery disease  Other Active Problems  CXR and UA negative for acute findings This plan of care was directed by Dr. Stefanie Libel, NP-C  Hospital day # 2   ATTENDING NOTE: I reviewed above note and agree with the assessment and plan. Pt was seen and examined.   70 year old female with history of hypertension, diabetes, hyperlipidemia, smoker, bladder cancer status post surgery in 09/2019, left ICA high-grade stenosis status post TCAR in 08/2019, history of stroke admitted for episode of LOC with fall and possible worsening of right-sided weakness on awakening, status post TPA.  Patient has stroke 08/2019 with left MCA infarct.  CT head and neck showed left ICA bulb near occlusion, left M2 occlusion and M1 stenosis.  Left P2/P3 stenosis.  Carotid Doppler showed 80 to 99% stenosis left ICA.  EF 60 to 65% but questionable AV mobile density.  LDL 158, A1c 8.2.  UDS positive for THC.  Patient discharged on DAPT and statin.  She had left TCAR procedure on 08/20/2019.  Continued on DAPT after.  In 09/2019, patient admitted for hematuria due to bladder cancer status post surgery during hospitalization.  Also had worsening right-sided weakness, found to have left thalamic infarct as well as subacute left frontal and left BG small infarcts.  MRA showed multifocal left M1/M2, bilateral P2/P3 stenosis.  Carotid Doppler left ICA stent patent.  EF 60 to 65%.  Patient declined TEE to evaluate AV at time.  Bilateral lower extremity venous Doppler showed bilateral DVT, put on heparin IV, status post IVC filter.  However patient was discharged only on aspirin 81 without anticoagulation due to hematuria.  LDL 46 and A1c 6.5.  Patient was also discharged with Lipitor 20.  Patient had no more hematuria since discharge.  03/2020 DVT repeat negative.  IVC filter planned to remove yesterday however, that has to be rescheduled due to current admission.  On this admission, per husband,  patient finished dinner, got up and went to bedroom changing cloths, however she fell in the bedroom facing.  Husband rushed to her found she was unconscious, no shaking jerking.  EMS called.  Before EMS arrival, patient gradually regained consciousness. More consistent with syncope.  On ER arrival, patient was found to have worsening right-sided weakness and right leg ataxia.  CT no acute abnormality, no bleeding.  CT head neck no LVO but right ICA 50% stenosis.  She was given TPA.  EEG negative but she was put on Keppra with suspicious for seizure activity.  EF 60 to 65%, aortic valve normal appearance.  LDL  42, A1c 6.0.  MRI negative for acute infarct, however seems to have a new left posterior temporal lobe ICH and right perimesencephalic cistern SAH.  CT head repeat CT confirmed these stable hemorrhages, likely due to TPA.   Husband at bedside.  On exam, awake, alert, eyes open, orientated to age, place, year and people, but not to month. Fluent language, however, frequent paraphasic errors and limited content, anomia but able to repeat, following all simple commands. No gaze palsy, tracking bilaterally, visual field full, PERRL. Right facial droop. Tongue midline. LUE 5/5, and right UE 4/5 with pronator drift and decreased finger grip. LLE 5/5, RLE proximal 5-/5 and distal ankle DF/PF 4-/5. Sensation symmetrical bilaterally, b/l FTN intact, gait not tested.   Given stable hemorrhage on CT post TPA, BP goal < 160. Hold off ASA 81 for now. Will let her follow up with Dr. Leonie Man via telephone in one week to consider resumption of ASA 81 at that time.  Continue Lipitor 20.  Current episode likely due to syncope, not seizure, EEG negative, will DC Keppra. PT/OT recommend CIR so far.   Rosalin Hawking, MD PhD Stroke Neurology 04/30/2020 7:07 PM  This patient is critically ill due to strokelike symptoms status post TPA, hemorrhagic conversion, history of multiple infarcts, and at significant risk of neurological  worsening, death form recurrent stroke, hematoma expansion, cerebral edema. This patient's care requires constant monitoring of vital signs, hemodynamics, respiratory and cardiac monitoring, review of multiple databases, neurological assessment, discussion with family, other specialists and medical decision making of high complexity. I spent 40 minutes of neurocritical care time in the care of this patient. I had long discussion with husband and patient at bedside, updated pt current condition, treatment plan and potential prognosis, and answered all the questions.  They expressed understanding and appreciation.        To contact Stroke Continuity provider, please refer to [email or web address] After hours, contact General Neurology

## 2020-04-30 NOTE — Progress Notes (Signed)
  Speech Language Pathology  Patient Details Name: Lisa [name] MRN: [id number] DOB: 12-06-1950 Today's Date: 04/30/2020 Time:  -              Spoke with Dr Erlinda Hong as he emerged from pt's room. She has not had new stroke. He suspects bleed may be due to TPA. History of stroke and seen by ST for significant Broca's aphasia 08/2019, again 09/2019. Per MD, pt and wife report language has improved after receiving therapy. This SLP agrees from documentation review from [place] notes 2021 and current chart notes from PT and OT. Dr. Erlinda Hong feels that full assessment is not needed at this time.    Houston Siren 04/30/2020, 11:58 AM Orbie Pyo Colvin Caroli.Ed Risk analyst [phone] Office [phone]

## 2020-05-01 DIAGNOSIS — I951 Orthostatic hypotension: Secondary | ICD-10-CM

## 2020-05-01 DIAGNOSIS — I611 Nontraumatic intracerebral hemorrhage in hemisphere, cortical: Secondary | ICD-10-CM

## 2020-05-01 LAB — CBC
HCT: 32.8 % — ABNORMAL LOW (ref 36.0–46.0)
Hemoglobin: 10.8 g/dL — ABNORMAL LOW (ref 12.0–15.0)
MCH: 28.3 pg (ref 26.0–34.0)
MCHC: 32.9 g/dL (ref 30.0–36.0)
MCV: 85.9 fL (ref 80.0–100.0)
Platelets: 214 10*3/uL (ref 150–400)
RBC: 3.82 MIL/uL — ABNORMAL LOW (ref 3.87–5.11)
RDW: 15.5 % (ref 11.5–15.5)
WBC: 7.3 10*3/uL (ref 4.0–10.5)
nRBC: 0 % (ref 0.0–0.2)

## 2020-05-01 LAB — BASIC METABOLIC PANEL
Anion gap: 8 (ref 5–15)
BUN: 10 mg/dL (ref 8–23)
CO2: 25 mmol/L (ref 22–32)
Calcium: 9.4 mg/dL (ref 8.9–10.3)
Chloride: 101 mmol/L (ref 98–111)
Creatinine, Ser: 0.55 mg/dL (ref 0.44–1.00)
GFR, Estimated: >60 mL/min (ref >60)
Glucose, Bld: 128 mg/dL — ABNORMAL HIGH (ref 70–99)
Potassium: 3.8 mmol/L (ref 3.5–5.1)
Sodium: 134 mmol/L — ABNORMAL LOW (ref 135–145)

## 2020-05-01 LAB — GLUCOSE, CAPILLARY
Glucose-Capillary: 133 mg/dL — ABNORMAL HIGH (ref 70–99)
Glucose-Capillary: 156 mg/dL — ABNORMAL HIGH (ref 70–99)
Glucose-Capillary: 151 mg/dL — ABNORMAL HIGH (ref 70–99)

## 2020-05-01 NOTE — Progress Notes (Signed)
Pt refusing telemetry monitoring. Alert and oriented X4.

## 2020-05-01 NOTE — Progress Notes (Addendum)
STROKE TEAM PROGRESS NOTE    Brief HPI:  She had a brief episode of loss of consciousness 3/17 with LOC for several seconds, husband heard thud and found her on floor. Once she became responsive again she was confused with impaired speech and worsening of her existing old right sided weakness. She was brought to the ED by EMS. S/p tPA 2050 3/17. Initial imaging negative.   INTERVAL HISTORY Neurologically stable . Orthostatic hypotension noted today. Tmax 99.  Denying and new problems or concerns.  Husband at bedside. Stroke work up and ongoing plan of care discussed with patient and husband at bedside.   Vitals:   05/01/20 1200 05/01/20 1356 05/01/20 1400 05/01/20 1600  BP: 131/66 (!) 142/72  128/68  Pulse: 86 71 80 85  Resp: (!) 22 (!) 24 (!) 24 17  Temp: 98.7 F (37.1 C)   98.6 F (37 C)  TempSrc: Oral     SpO2: 99% 98% 98% 99%  Weight:      Height:       CBC:  Recent Labs  Lab 04/28/20 2033 04/28/20 2038 04/30/20 0236 05/01/20 0139  WBC 9.8  --  6.6 7.3  NEUTROABS 5.5  --   --   --   HGB 12.2   < > 10.3* 10.8*  HCT 39.7   < > 32.5* 32.8*  MCV 88.8  --  86.2 85.9  PLT 236  --  183 214   < > = values in this interval not displayed.   Basic Metabolic Panel:  Recent Labs  Lab 04/30/20 0236 05/01/20 0139  NA 135 134*  K 3.6 3.8  CL 103 101  CO2 25 25  GLUCOSE 115* 128*  BUN 8 10  CREATININE 0.54 0.55  CALCIUM 9.4 9.4   Lipid Panel:  Recent Labs  Lab 04/29/20 0240  CHOL 127  TRIG 77  HDL 70  CHOLHDL 1.8  VLDL 15  LDLCALC 42   HgbA1c:  Recent Labs  Lab 04/29/20 0240  HGBA1C 6.0*   Urine Drug Screen:  Recent Labs  Lab 04/29/20 1407  LABOPIA NONE DETECTED  COCAINSCRNUR NONE DETECTED  LABBENZ NONE DETECTED  AMPHETMU NONE DETECTED  THCU NONE DETECTED  LABBARB NONE DETECTED    Alcohol Level  Recent Labs  Lab 04/28/20 2033  ETH 147*    IMAGING past 24 hours No results found.  PHYSICAL EXAM   General:Elderly female lying in bed in  NAD. HENT: Normal oropharynx and mucosa. Normal external appearance of ears and nose. ST:[id number] rhythm on telemetry Pulmonary:Symmetric Chest rise. Normal respiratory effort. Abdomen: Soft to touch, non-tender. Skin:No rash, warm and dry  Neuro - awake, alert, eyes open, orientated to age, place,yearand people, but not to month.Fluent language, however, frequent paraphasic errors and limited content, anomia but able to repeat, following all simple commands. No gaze palsy, tracking bilaterally, visual field full, PERRL.Rightfacial droop. Tongue midline.LUE 5/5,and right UE 4/5 with pronatordriftand decreased finger grip.LLE 5/5,RLE proximal 5-/5 and distal ankle DF/PF 4-/5. Sensation symmetrical bilaterally, b/l FTN intact, gait not tested.   ASSESSMENT/PLAN Lisa [name] is a 70 y.o. 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 Given significant R leg incoordination, tPA was given.  Not a candidate for thrombectomy. EEG also ordered and loaded with Keppra given she had a ?episode of unresponsiveness for 10 secs. Was not post ictal on arrival to the ED though. NIHSS 9  tPA given at 2051.   Syncope due to orthostatic hypotension  Pt presented with fall with LOC after getting up from chair after dinner going to bedroom  No shaking jerking  EEG negative but she was put on Keppra with suspicious for seizure activity, now off.  Orthostatic hypotension management see blow  Orthostatic Hypotension  3/19 BP lying 153/72, sitting 149/86, standing 98/87  3/20 BP lying 131/66, sitting 144/69, standing 124/80, standing for 3 minutes 89/55  Hospitalist consulted: appreciate recs  TED hose: thigh high  Maintain adequate  hydration  Check BP twice per day.   Left posterior temporal lobe ICH and right perimesencephalic cistern SAH s/p tPA  Code stroke head CT:  No acute findings   CTA head & neck: No LVO, patent left carotid stent. Moderate intracranial atherosclerotic disease without proximal high-grade stenosis  MR Brain (3/18) Old infarction in the left middle cerebral artery territory. There is a small focus of more prominent hemosiderin deposition at the posterior temporal lobe possible recent hemorrhage/hemorrhagic infarction, surrounded by a small bit of edema.   Repeat [facility] 3/18 2200 - New 1.73mc IPH and an additional 63mm hemorrhage at the R perimesencephalic cistern.  2D Echo: EF 60 to 65%   EEG cortical dysfunction arising from left frontotemporal region, nonspecific etiology. No seizures or epileptiform discharges   LDL 42  HgbA1c 6.0  VTE prophylaxis - consider 3/19 if hgb remains stable    On ASA 81 PTA, Hold off ASA 81 for now given ICH. Will let her follow up with Dr. Leonie Man via telephone in one week to consider resumption of ASA 81 at that time.  Therapy recommendations:  OT Home health  Disposition:  Home vs CIR  Hx of stroke  08/2019 with left MCA infarct.  CT head and neck showed left ICA bulb near occlusion, left M2 occlusion and M1 stenosis.  Left P2/P3 stenosis.  Carotid Doppler showed 80 to 99% stenosis left ICA.  EF 60 to 65% but questionable AV mobile density.  LDL 158, A1c 8.2.  UDS positive for THC.  Patient discharged on DAPT and statin.  She had left TCAR procedure on 08/20/2019.  Continued on DAPT after.  In 09/2019, patient admitted for hematuria due to bladder cancer status post surgery during hospitalization.  Also had worsening right-sided weakness, found to have left thalamic infarct as well as subacute left frontal and left BG small infarcts.  MRA showed multifocal left M1/M2, bilateral P2/P3 stenosis.  Carotid Doppler left ICA stent patent.  EF 60 to 65%.  Patient  declined TEE to evaluate AV at time.  Bilateral lower extremity venous Doppler showed bilateral DVT, put on heparin IV, status post IVC filter.  However patient was discharged only on aspirin 81 without anticoagulation due to hematuria.  LDL 46 and A1c 6.5.  Patient was also discharged with Lipitor 20.  Patient had no more hematuria since discharge.  03/2020 DVT repeat negative.  IVC filter planned to remove yesterday however, that has to be rescheduled due to current admission.  Hyperlipidemia  Home meds:  Lipitor 20mg , resumed in hospital  LDL 42, goal < 70  High intensity statin not required as patient is at goal  Continue statin at discharge  Diabetes type II controlled  Home meds:  Metformin  HgbA1c  6.0, goal < 7.0  CBGs  SSI  Close PCP follow up  Other Stroke Risk Factors  Advanced Age >/= 21   Former Cigarette smoker, quit 2021  Coronary artery disease  Other Active Problems  Hx of bladder cancer w/associated hematuria in July 2021. Patient had no more hematuria since then.   Hospital day # 3  Lissy Olivencia-Simmons, ACNP-BC Stroke NP 05/01/2020    ATTENDING NOTE: I reviewed above note and agree with the assessment and plan. Pt was seen and examined.   Husband at bedside, patient lying in bed, awake alert, no acute event overnight, no neuro changes.  She is eager to go home today.  However, yesterday orthostatic vitals showed orthostatic hypotension.  Today she worked with OT and also showed orthostatic vitals on standing up.  Put on thigh-high TED hose, encourage p.o. intake.  Her lying and sitting BP have been in good range.  Consulted internal medicine for further management.  Patient LOC and fall on presentation likely due to syncope in the setting of orthostatic hypotension.  Pending PT.  For detailed assessment and plan, please refer to above as I have made changes wherever appropriate.   Rosalin Hawking, MD PhD Stroke Neurology 05/01/2020 7:23  PM  Patient continues to have orthostatic hypotension on repeated BP checking, and I have consulted nuclear medicine. I spent  35 minutes in total face-to-face time with the patient, more than 50% of which was spent in counseling and coordination of care, reviewing test results, images and medication, and discussing the diagnosis, treatment plan and potential prognosis. This patient's care requiresreview of multiple databases, neurological assessment, discussion with family, other specialists and medical decision making of high complexity.   To contact Stroke Continuity provider, please refer to [email or web address] After hours, contact General Neurology

## 2020-05-01 NOTE — Discharge Summary (Addendum)
Stroke Discharge Summary  Patient ID: Lisa [name]   MRN: [id number]      DOB: 02-29-1950  Date of Admission: 04/28/2020 Date of Discharge: 05/02/2020  Attending Physician:  Rosalin Hawking, MD, Stroke MD Consultant(s):    Hospitalist for orthostatic hypotension Patient's PCP:  Orma Flaming, MD  DISCHARGE DIAGNOSIS:  Principal Problem:   Intracranial hemorrhage ([place]) post tPA Active Problems:   Syncope   Orthostatic hypotension   Intracerebral hemorrhage   Allergies as of 05/02/2020   No Known Allergies     Medication List    STOP taking these medications   aspirin 81 MG EC tablet     TAKE these medications   amLODipine 10 MG tablet Commonly known as: NORVASC Take 0.5 tablets (5 mg total) by mouth daily. What changed: how much to take   atorvastatin 20 MG tablet Commonly known as: LIPITOR Take 1 tablet (20 mg total) by mouth daily.   docusate sodium 100 MG capsule Commonly known as: COLACE Take 1 capsule (100 mg total) by mouth daily. What changed:   when to take this  reasons to take this   losartan 50 MG tablet Commonly known as: COZAAR Take 25 mg by mouth every morning. What changed: Another medication with the same name was removed. Continue taking this medication, and follow the directions you see here.   Melatonin 10 MG Caps Take 10 mg by mouth at bedtime as needed (sleep).   metFORMIN 500 MG 24 hr tablet Commonly known as: Glucophage XR Take 1 tablet (500 mg total) by mouth daily with breakfast.   polyethylene glycol 17 g packet Commonly known as: MIRALAX / GLYCOLAX Take 17 g by mouth daily as needed for mild constipation or moderate constipation.   traZODone 50 MG tablet Commonly known as: DESYREL Take 1 tablet (50 mg total) by mouth at bedtime. What changed:   when to take this  reasons to take this       LABORATORY STUDIES CBC    Component Value Date/Time   WBC 6.3 05/02/2020 0455   RBC 4.14 05/02/2020 0455   HGB 11.3  (L) 05/02/2020 0455   HCT 36.0 05/02/2020 0455   PLT 189 05/02/2020 0455   MCV 87.0 05/02/2020 0455   MCH 27.3 05/02/2020 0455   MCHC 31.4 05/02/2020 0455   RDW 15.2 05/02/2020 0455   LYMPHSABS 3.3 04/28/2020 2033   MONOABS 0.8 04/28/2020 2033   EOSABS 0.2 04/28/2020 2033   BASOSABS 0.0 04/28/2020 2033   CMP    Component Value Date/Time   NA 134 (L) 05/02/2020 0455   K 3.7 05/02/2020 0455   CL 101 05/02/2020 0455   CO2 26 05/02/2020 0455   GLUCOSE 125 (H) 05/02/2020 0455   BUN 9 05/02/2020 0455   CREATININE 0.57 05/02/2020 0455   CALCIUM 9.5 05/02/2020 0455   PROT 6.5 04/28/2020 2033   ALBUMIN 3.8 04/28/2020 2033   AST 20 04/28/2020 2033   ALT 14 04/28/2020 2033   ALKPHOS 83 04/28/2020 2033   BILITOT 0.8 04/28/2020 2033   GFRNONAA >60 05/02/2020 0455   GFRAA >60 10/18/2019 0215   COAGS Lab Results  Component Value Date   INR 1.0 04/28/2020   INR 1.8 (H) 10/06/2019   INR 1.9 (H) 10/05/2019   Lipid Panel    Component Value Date/Time   CHOL 127 04/29/2020 0240   TRIG 77 04/29/2020 0240   HDL 70 04/29/2020 0240   CHOLHDL 1.8 04/29/2020 0240   VLDL  15 04/29/2020 0240   LDLCALC 42 04/29/2020 0240   HgbA1C  Lab Results  Component Value Date   HGBA1C 6.0 (H) 04/29/2020   Urinalysis    Component Value Date/Time   COLORURINE STRAW (A) 04/29/2020 1408   APPEARANCEUR CLEAR 04/29/2020 1408   LABSPEC 1.014 04/29/2020 1408   PHURINE 6.0 04/29/2020 1408   GLUCOSEU NEGATIVE 04/29/2020 1408   HGBUR NEGATIVE 04/29/2020 1408   BILIRUBINUR NEGATIVE 04/29/2020 1408   KETONESUR NEGATIVE 04/29/2020 1408   PROTEINUR NEGATIVE 04/29/2020 1408   NITRITE NEGATIVE 04/29/2020 1408   LEUKOCYTESUR NEGATIVE 04/29/2020 1408   Urine Drug Screen     Component Value Date/Time   LABOPIA NONE DETECTED 04/29/2020 1407   COCAINSCRNUR NONE DETECTED 04/29/2020 1407   LABBENZ NONE DETECTED 04/29/2020 1407   AMPHETMU NONE DETECTED 04/29/2020 1407   THCU NONE DETECTED 04/29/2020 1407    LABBARB NONE DETECTED 04/29/2020 1407    Alcohol Level    Component Value Date/Time   ETH 147 (H) 04/28/2020 2033     SIGNIFICANT DIAGNOSTIC STUDIES CT HEAD WO CONTRAST  Result Date: 04/30/2020 IMPRESSION:  1. No worsening or new finding since yesterday. Hemorrhagic stroke or contusion in the left posterior temporal lobe, maximal dimension of 11 mm today. Small amount of surrounding edema. Small amount of hemorrhage in the perimesencephalic cistern on the right also measures a mm or 2 smaller. The presence of these 2 abnormalities suggests the possibility of head trauma.  2. Old left MCA territory infarction. Old small vessel infarctions in the left basal ganglia and bilateral cerebral hemispheric white matter.   CT HEAD WO CONTRAST Result Date: 04/29/2020 IMPRESSION:  1. New 1.5 cm intraparenchymal hemorrhage positioned slightly posteriorly and inferiorly to the chronic left MCA distribution infarct. Minimal surrounding vasogenic edema without significant regional mass effect.  2. Additional 8 mm hemorrhage at the right perimesencephalic cistern. 3. No other new acute intracranial abnormality.  4. Underlying atrophy with chronic small vessel ischemic disease with chronic left MCA territory infarct.   MR BRAIN WO CONTRAST Result Date: 04/29/2020 IMPRESSION:  1. Old infarction in the left middle cerebral artery territory. Atrophy, encephalomalacia and gliosis. Hemosiderin deposition within the region of old infarction. There is a small focus of more prominent hemosiderin deposition at the posterior temporal lobe which could possibly be a more recent hemorrhage/hemorrhagic infarction, surrounded by a small bit of edema. This is located a little bit below and posterior to the old infarction.  2. Chronic small-vessel ischemic changes elsewhere throughout the brain as outlined above.   DG CHEST PORT 1 VIEW Result Date: 04/29/2020 IMPRESSION: No acute chest findings.   EEG adult Result Date:  04/29/2020 IMPRESSION: This study is suggestive of cortical dysfunction arising from left frontotemporal region, nonspecific etiology, likely secondary to underlying encephalomalacia. No seizures or epileptiform discharges were seen throughout the recording.   ECHOCARDIOGRAM  Result Date: 04/29/2020 IMPRESSIONS 1. Left ventricular ejection fraction, by estimation, is 60 to 65%. The left ventricle has normal function. The left ventricle has no regional wall motion abnormalities. Left ventricular diastolic parameters are consistent with Grade I diastolic dysfunction (impaired relaxation).   2. Right ventricular systolic function is normal. The right ventricular size is mildly enlarged. There is normal pulmonary artery systolic pressure. The estimated right ventricular systolic pressure is 27.7 mmHg.   3. The mitral valve is normal in structure. Trivial mitral valve regurgitation. No evidence of mitral stenosis.   4. The aortic valve is grossly normal. There is mild calcification of the  aortic valve. Aortic valve regurgitation is not visualized. No aortic stenosis is present.   5. The inferior vena cava is normal in size with greater than 50% respiratory variability, suggesting right atrial pressure of 3 mmHg.   CT HEAD CODE STROKE WO CONTRAST Result Date: 04/28/2020  IMPRESSION:  1. No acute intracranial infarct or other abnormality.  2. ASPECTS is 10. 3. Large remote left MCA distribution infarct. Small focus of hyperdensity along the anterior margin of the chronic ischemic infarct favored to reflect a small amount of residual laminar necrosis.   CT ANGIO HEAD/Neck CODE STROKE Result Date: 04/28/2020  IMPRESSION:  1. Negative CTA for emergent large vessel occlusion.  2. 50% atheromatous stenosis at the origin of the right ICA.  3. Widely patent left carotid stent.  4. Moderate intracranial atherosclerotic disease without proximal high-grade or correctable stenosis.     HISTORY OF PRESENT  ILLNESS On this admission, per husband, patient finished dinner, got up and went to bedroom changing cloths, however she fell in the bedroom facing down.  Husband rushed to her found she was unconscious, no shaking jerking.  EMS called.  Before EMS arrival, patient gradually regained consciousness. More consistent with syncope.    HOSPITAL COURSE On ER arrival, patient was found to have worsening right-sided weakness and right leg ataxia.  CT no acute abnormality, no bleeding.  CT head/neck no LVO but right ICA 50% stenosis.  She was given TPA.  EEG negative but she was put on Keppra with suspicious for seizure activity.  EF 60 to 65%, aortic valve normal appearance.  LDL 42, A1c 6.0.  MRI negative for acute infarct, however seems to have a new left posterior temporal lobe ICH and right perimesencephalic cistern SAH.  Repeat CT head confirmed these stable hemorrhages, likely due to TPA.    Syncope due to orthostatic hypotension  Pt presented with fall with LOC after getting up from chair after dinner going to bedroom  No shaking jerking  EEG negative but she was put on Keppra with suspicious for seizure activity, now off.  Orthostatic hypotension management see below  Orthostatic Hypotension  3/19 BP lying 153/72, sitting 149/86, standing 98/87  3/20 BP lying 131/66, sitting 144/69, standing 124/80, standing for 3 minutes 89/55  Hospitalist consulted:recommend conservative measures with nonpharmacologic treatment.  Stay very well-hydrated especially when they go on there very long walks.  Avoid dehydration. Wear abdominal binder when active and not supine.  TED hose: thigh high  Maintain adequate hydration  Check BP twice per day.   Left posterior temporal lobe ICH and right perimesencephalic cistern SAH s/p tPA  Code stroke head CT:  No acute findings   CTA head & neck: No LVO, patent left carotid stent. Moderate intracranial atherosclerotic disease without proximal high-grade  stenosis  MR Brain (3/18) Old infarction in the left middle cerebral artery territory. There is a small focus of more prominent hemosiderin deposition at the posterior temporal lobe possible recent hemorrhage/hemorrhagic infarction, surrounded by a small bit of edema.   Repeat [facility] 3/18 2200 - New 1.7mc IPH and an additional 6mm hemorrhage at the R perimesencephalic cistern.  2D Echo: EF 60 to 65%, No thrombus, wall motion abnormality or shunt found. Grade I diastolic dysfunction was noted.   LDL 42  HgbA1c 6.0  Hold off ASA 81 for now given hemorrhage s/p tPA. Will let her follow up with Dr. Leonie Man via telephone in one week to consider resumption of ASA 81 at that time.  Therapy  recommendations: home health OT/PT  Hypertension  Home meds:  Norvasc 10mg , Cozaar 25mg  daily   Stable, decrease Norvasc to 5mg  daily.   Monitor BP 2 times per day and write down results.  Provide this information to your PCP and cardiologist at your next visit.  SBP goal< 160  Hyperlipidemia  Home meds:  Lipitor 20mg , resumed in hospital  LDL 42, goal < 70  Continue statin at discharge  Diabetes type II controlled  Home meds:  Metformin  HgbA1c 6.0, goal < 7.0  Close PCP follow-up  Concern for seizure  EEG without seizure  No further seizure like activity  No indication for AED at present   Other Stroke Risk Factors  Advanced Age >/= 38   Former Cigarette smoker, quit 2021  Hx stroke/TIA  Coronary artery disease  RN Pressure Injury Documentation: Pressure Injury 09/06/19 Toe (Comment  which one) Anterior;Right Stage 2 -  Partial thickness loss of dermis presenting as a shallow open injury with a red, pink wound bed without slough. skin tag partially off on second toe (Active)  09/06/19 2143  Location: Toe (Comment  which one)  Location Orientation: Anterior;Right  Staging: Stage 2 -  Partial thickness loss of dermis presenting as a shallow open injury with a red, pink wound  bed without slough.  Wound Description (Comments): skin tag partially off on second toe  Present on Admission: Yes     DISCHARGE EXAM Blood pressure (!) 154/74, pulse 79, temperature 97.6 F (36.4 C), temperature source Oral, resp. rate 18, height 5\' 6"  (1.676 m), weight 74.6 kg, SpO2 100 %.   General:Elderly female lying in bed in NAD.  HENT: Normal oropharynx and mucosa. Normal external appearance of ears and nose. UJ:[id number] rhythm on telemetry Pulmonary:Symmetric Chest rise. Normal respiratory effort. Abdomen: Soft to touch, non-tender. Skin:No rash, warm and dry  Mental status/Cognition: Alert, oriented to name and place not to year Speech/language:Clear, somewhat hesitant at times. Able to name 2/2 objects. Able to repeat 3 word phrase.   Neuro - awake, alert, eyes open, orientated to age, place,yearand people, but not to month.Fluent language, however, frequent paraphasic errors and limited content, anomia but able to repeat, following all simple commands. No gaze palsy, tracking bilaterally, visual field full, PERRL.Rightfacial droop. Tongue midline.LUE 5/5,and right UE 4/5 with pronatordriftand decreased finger grip.LLE 5/5,RLE proximal 5-/5 and distal ankle DF/PF 4-/5. Sensation symmetrical bilaterally, b/l FTN intact, gait not tested.  Discharge Diet       Diet   Diet Carb Modified Fluid consistency: Thin; Room service appropriate? Yes with Assist   liquids  DISCHARGE PLAN  Disposition:  Home with home health OT  Hold off ASA 81 for now. Will let her follow up with Dr. Leonie Man via telephone in one week to consider resumption of ASA 81 at that time.  Ongoing stroke risk factor control by Primary Care Physician at time of discharge  Follow-up PCP Orma Flaming, MD in 2 weeks.  Follow-up in [facility] in 4 weeks, office to schedule an appointment.   Follow-up with cardiologist within 1 week of discharge for further  evaluation of orthostatic hypotension and medication adjustment.  Reminders: Get out of bed slowly.  Stay well hydrated especially with long walks.  Wear abdominal binder anytime you are being active and not supine.   45 minutes were spent preparing discharge.  Rosalin Hawking, MD PhD Stroke Neurology 05/02/2020 6:44 PM

## 2020-05-01 NOTE — Consult Note (Signed)
Hospitalist Service Medical Consultation   Lisa [name]  XAJ:[id number]  DOB: [birth year]  DOA: 04/28/2020  PCP: Orma Flaming, MD   Requesting physician: Dr. Erlinda Hong  Reason for consultation: Assistance with orthostatic hypotension.  History is per patient and her husband.  History of Present Illness: Lisa [name] is an 70 y.o. 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  Husband states that patient had just finished dinner when he heard a thud.  He found her on the floor with loss of consciousness but when she came to she had worsened dysarthria, could not walk and had worsened facial droop.  Patient was evaluated in the ED and admitted to neurology service to rule out new stroke.  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  Aspirin was held.  She was also noted to have significant orthostatic hypotension which persisted even after hydration and fluid resuscitation.  Tried hospitalist are consulted to assist with management of orthostatic hypotension.  Patient had orthostatics done earlier today by occupational therapy.  It was noted that patient had significant drop in systolic blood pressure 3 minutes after standing.  Patient however was asymptomatic during this time.  After discussion with patient and her husband, it was notable that patient had just gone for a long walk with her husband and admitted to some diaphoresis and not drinking very much water that day prior to her episode of syncope.  Patient denies any previous episodes of syncope.  She denies feeling faint or like she is going to pass out when she gets out of bed in the morning.  She has never been told that she has orthostatic symptoms.  At present, patient is very eager to go home and feels that she is back to  baseline.   Review of Systems:  As per HPI otherwise 10 point review of systems negative.   Past Medical History: Past Medical History:  Diagnosis Date  . Anemia   . Cancer ([place])   . Carotid arterial disease ([place]) 07/2019  . Diabetes ([place])   . HTN (hypertension)   . Hyperlipidemia   . Stroke [facility])    acute/subacute left MCA infarct 08/12/19    Past Surgical History: Past Surgical History:  Procedure Laterality Date  . CYSTOSCOPY WITH FULGERATION N/A 09/01/2019   Procedure: CYSTOSCOPY CLOT EVACUATION WITH POSSIBLE FULGERATION;  Surgeon: Robley Fries, MD;  Location: [place];  Service: Urology;  Laterality: N/A;  . CYSTOSCOPY WITH FULGERATION N/A 09/28/2019   Procedure: [place] AND CLOT EVACUATION;  Surgeon: Robley Fries, MD;  Location: [place];  Service: Urology;  Laterality: N/A;  . IR IVC FILTER PLMT / S&I [facility] Keels GUID/MOD SED  10/07/2019  . IR RADIOLOGIST EVAL & MGMT  03/22/2020  . TONSILLECTOMY    . TRANSCAROTID ARTERY REVASCULARIZATION Left 08/20/2019   Procedure: LEFT TRANSCAROTID ARTERY REVASCULARIZATION;  Surgeon: Waynetta Sandy, MD;  Location: [place];  Service: Vascular;  Laterality: Left;  . TRANSURETHRAL RESECTION OF BLADDER TUMOR WITH MITOMYCIN-C N/A 09/22/2019   Procedure: TRANSURETHRAL RESECTION OF BLADDER TUMOR WITH INTRAVESICAL GEMCITABINE;  Surgeon: Robley Fries, MD;  Location: [facility];  Service: Urology;  Laterality: N/A;  90 MINS     Allergies:  No Known Allergies  Social History:  reports that she quit smoking about 9 months ago. Her smoking use included cigarettes. She smoked 0.50 packs per day. She has never used smokeless tobacco. She reports current alcohol use. She reports that she does not use drugs.   Family History: Family History  Problem Relation Age of Onset  . Hypertension Mother   . Lung cancer Father      Physical Exam: Vitals:   05/01/20 0800 05/01/20 1123 05/01/20 1200 05/01/20 1356  BP: (!) 163/84  (!) 143/79 131/66 (!) 142/72  Pulse: 77 80 86 71  Resp: 18 13 (!) 22 (!) 24  Temp: 97.8 F (36.6 C)  98.7 F (37.1 C)   TempSrc: Oral  Oral   SpO2: 100% 98% 99% 98%  Weight:      Height:        Constitutional: Alert and awake, oriented x3, not in any acute distress. Eyes: sclera anicteric, conjuctiva clear, no lid lag, no exophthalmos, EOMI CVS: S1-S2 clear, no murmur rubs or gallops, no LE edema, normal pedal pulses  Respiratory:  normal effort, symmetrical excursion, CTA without adventitious sounds.  GI: NABS, soft, NT, ND, no palpable masses.  LE: No edema. No cyanosis Neuro: Patient has mild dysarthria and minimal expressive aphasia.Skin: no rashes or lesions or ulcers,    Data reviewed:  I have personally reviewed following labs and imaging studies Labs:  CBC: Recent Labs  Lab 04/28/20 2033 04/28/20 2038 04/29/20 0240 04/29/20 0924 04/29/20 1556 04/30/20 0236 05/01/20 0139  WBC 9.8  --   --   --   --  6.6 7.3  NEUTROABS 5.5  --   --   --   --   --   --   HGB 12.2   < > 10.8* 10.9* 11.5* 10.3* 10.8*  HCT 39.7   < > 34.2* 34.7* 35.9* 32.5* 32.8*  MCV 88.8  --   --   --   --  86.2 85.9  PLT 236  --   --   --   --  183 214   < > = values in this interval not displayed.    Basic Metabolic Panel: Recent Labs  Lab 04/28/20 2033 04/28/20 2038 04/29/20 0240 04/30/20 0236 05/01/20 0139  NA 136 138 137 135 134*  K 3.7 3.8 3.8 3.6 3.8  CL 102 101 106 103 101  CO2 24  --  22 25 25   GLUCOSE 131* 129* 131* 115* 128*  BUN 15 17 13 8 10   CREATININE 0.64 0.90 0.59 0.54 0.55  CALCIUM 9.6  --  9.2 9.4 9.4   GFR Estimated Creatinine Clearance: 70.5 mL/min (by C-G formula based on SCr of 0.55 mg/dL). Liver Function Tests: Recent Labs  Lab 04/28/20 2033  AST 20  ALT 14  ALKPHOS 83  BILITOT 0.8  PROT 6.5  ALBUMIN 3.8   No results for input(s): LIPASE, AMYLASE in the last 168 hours. No results for input(s): AMMONIA in the last 168 hours. Coagulation  profile Recent Labs  Lab 04/28/20 2033  INR 1.0    Cardiac Enzymes: No results for input(s): CKTOTAL, CKMB, CKMBINDEX, TROPONINI in the last 168 hours. BNP: Invalid input(s): POCBNP CBG: Recent Labs  Lab 04/30/20 1111 04/30/20 1634 04/30/20 2143 05/01/20 0752 05/01/20 1134  GLUCAP 203* 122* 135* 133* 156*   D-Dimer No results for input(s): DDIMER in the last 72 hours. Hgb A1c Recent Labs    04/29/20 0240  HGBA1C 6.0*   Lipid Profile Recent Labs  04/29/20 0240  CHOL 127  HDL 70  LDLCALC 42  TRIG 77  CHOLHDL 1.8   Thyroid function studies No results for input(s): TSH, T4TOTAL, T3FREE, THYROIDAB in the last 72 hours.  Invalid input(s): FREET3 Anemia work up No results for input(s): VITAMINB12, FOLATE, FERRITIN, TIBC, IRON, RETICCTPCT in the last 72 hours. Urinalysis    Component Value Date/Time   COLORURINE STRAW (A) 04/29/2020 1408   APPEARANCEUR CLEAR 04/29/2020 1408   LABSPEC 1.014 04/29/2020 1408   PHURINE 6.0 04/29/2020 1408   GLUCOSEU NEGATIVE 04/29/2020 1408   HGBUR NEGATIVE 04/29/2020 1408   BILIRUBINUR NEGATIVE 04/29/2020 1408   KETONESUR NEGATIVE 04/29/2020 1408   PROTEINUR NEGATIVE 04/29/2020 1408   NITRITE NEGATIVE 04/29/2020 1408   LEUKOCYTESUR NEGATIVE 04/29/2020 1408     Sepsis Labs Invalid input(s): PROCALCITONIN,  WBC,  LACTICIDVEN Microbiology Recent Results (from the past 240 hour(s))  Resp Panel by RT-PCR (Flu A&B, Covid) Nasopharyngeal Swab     Status: None   Collection Time: 04/28/20  9:00 PM   Specimen: Nasopharyngeal Swab; Nasopharyngeal(NP) swabs in vial transport medium  Result Value Ref Range Status   SARS Coronavirus 2 by RT PCR NEGATIVE NEGATIVE Final    Comment: (NOTE) SARS-CoV-2 target nucleic acids are NOT DETECTED.  The SARS-CoV-2 RNA is generally detectable in upper respiratory specimens during the acute phase of infection. The lowest concentration of SARS-CoV-2 viral copies this assay can detect is 138  copies/mL. A negative result does not preclude SARS-Cov-2 infection and should not be used as the sole basis for treatment or other patient management decisions. A negative result may occur with  improper specimen collection/handling, submission of specimen other than nasopharyngeal swab, presence of viral mutation(s) within the areas targeted by this assay, and inadequate number of viral copies(<138 copies/mL). A negative result must be combined with clinical observations, patient history, and epidemiological information. The expected result is Negative.  Fact Sheet for Patients:  EntrepreneurPulse.com.au  Fact Sheet for Healthcare Providers:  IncredibleEmployment.be  This test is no t yet approved or cleared by the [place] FDA and  has been authorized for detection and/or diagnosis of SARS-CoV-2 by FDA under an Emergency Use Authorization (EUA). This EUA will remain  in effect (meaning this test can be used) for the duration of the COVID-19 declaration under Section 564(b)(1) of the Act, 21 U.S.C.section 360bbb-3(b)(1), unless the authorization is terminated  or revoked sooner.       Influenza A by PCR NEGATIVE NEGATIVE Final   Influenza B by PCR NEGATIVE NEGATIVE Final    Comment: (NOTE) The Xpert Xpress SARS-CoV-2/FLU/RSV plus assay is intended as an aid in the diagnosis of influenza from Nasopharyngeal swab specimens and should not be used as a sole basis for treatment. Nasal washings and aspirates are unacceptable for Xpert Xpress SARS-CoV-2/FLU/RSV testing.  Fact Sheet for Patients: EntrepreneurPulse.com.au  Fact Sheet for Healthcare Providers: IncredibleEmployment.be  This test is not yet approved or cleared by the [place] FDA and has been authorized for detection and/or diagnosis of SARS-CoV-2 by FDA under an Emergency Use Authorization (EUA). This EUA will remain in effect (meaning  this test can be used) for the duration of the COVID-19 declaration under Section 564(b)(1) of the Act, 21 U.S.C. section 360bbb-3(b)(1), unless the authorization is terminated or revoked.  Performed at [facility], [place] [address], [place], [place] [address]   MRSA PCR Screening     Status: None   Collection Time: 04/28/20 10:45 PM   Specimen: Nasal  Mucosa; Nasopharyngeal  Result Value Ref Range Status   MRSA by PCR NEGATIVE NEGATIVE Final    Comment:        The GeneXpert MRSA Assay (FDA approved for NASAL specimens only), is one component of a comprehensive MRSA colonization surveillance program. It is not intended to diagnose MRSA infection nor to guide or monitor treatment for MRSA infections. Performed at [facility], [place] [address] [place], [place] [address]        Inpatient Medications:   Scheduled Meds: . amLODipine  10 mg Oral Daily  . atorvastatin  20 mg Oral Daily  . Chlorhexidine Gluconate Cloth  6 each Topical Daily  . enoxaparin (LOVENOX) injection  40 mg Subcutaneous Q24H  . insulin aspart  0-9 Units Subcutaneous TID WC  . losartan  25 mg Oral Daily  . pantoprazole  40 mg Oral Q supper   Continuous Infusions:   Radiological Exams on Admission: CT HEAD WO CONTRAST  Result Date: 04/30/2020 CLINICAL DATA:  Follow-up intracranial hemorrhage EXAM: CT HEAD WITHOUT CONTRAST TECHNIQUE: Contiguous axial images were obtained from the base of the skull through the vertex without intravenous contrast. COMPARISON:  MRI and CT yesterday. FINDINGS: Brain: No worsening or new finding since yesterday. Hemorrhagic stroke or contusion in the left posterior temporal low again demonstrated, maximal dimension of the blood 11 mm today. Small amount of surrounding edema. Small amount of hemorrhage in the perimesencephalic cistern on the right also measures a mm or 2 smaller. The presence of these 2 abnormalities suggests the possibility head trauma. Old infarction  in the left MCA territory is unchanged. Old small vessel infarctions in the left basal ganglia and in bilateral cerebral hemispheric white matter appear unchanged. Ventricular size is stable. No subdural collection. Vascular: There is atherosclerotic calcification of the major vessels at the base of the brain. Skull: Negative Sinuses/Orbits: Clear/normal Other: None IMPRESSION: 1. No worsening or new finding since yesterday. Hemorrhagic stroke or contusion in the left posterior temporal lobe, maximal dimension of 11 mm today. Small amount of surrounding edema. Small amount of hemorrhage in the perimesencephalic cistern on the right also measures a mm or 2 smaller. The presence of these 2 abnormalities suggests the possibility of head trauma. 2. Old left MCA territory infarction. Old small vessel infarctions in the left basal ganglia and bilateral cerebral hemispheric white matter. Electronically Signed   By: Nelson Chimes M.D.   On: 04/30/2020 09:46   CT HEAD WO CONTRAST  Result Date: 04/29/2020 CLINICAL DATA:  Follow-up examination for acute stroke, cerebral hemorrhage suspected. EXAM: CT HEAD WITHOUT CONTRAST TECHNIQUE: Contiguous axial images were obtained from the base of the skull through the vertex without intravenous contrast. COMPARISON:  Prior MRI from earlier the same day. FINDINGS: Brain: Atrophy with chronic small vessel ischemic disease again noted. Chronic encephalomalacia related to a remote left MCA territory infarct again noted. There is a new 1.5 cm intraparenchymal hemorrhage positioned slightly posteriorly and inferiorly to the chronic left MCA distribution infarct at the posterior left temporal occipital region (series 3, image 12). Minimal surrounding vasogenic edema without significant regional mass effect. Additional 8 mm hemorrhage present at the right perimesencephalic cistern (series 11, image 11). No associated hemorrhage. Findings presumably related to prior tPA administration. No  other visible hemorrhage. Probable trace laminar necrosis at the anterior margin of the chronic left MCA distribution infarct again noted. No visible acute or vaulting large vessel territory infarct. No mass lesion, mass effect, or midline shift. No hydrocephalus  or extra-axial fluid collection. Vascular: No hyperdense vessel. Skull: Scalp soft tissues and calvarium within normal limits. Sinuses/Orbits: Globes and orbital soft tissues demonstrate no acute finding. Paranasal sinuses and mastoid air cells remain clear. Other: 9. IMPRESSION: 1. New 1.5 cm intraparenchymal hemorrhage positioned slightly posteriorly and inferiorly to the chronic left MCA distribution infarct. Minimal surrounding vasogenic edema without significant regional mass effect. 2. Additional 8 mm hemorrhage at the right perimesencephalic cistern. 3. No other new acute intracranial abnormality. 4. Underlying atrophy with chronic small vessel ischemic disease with chronic left MCA territory infarct. Critical Value/emergent results were called by telephone at the time of interpretation on 04/29/2020 at 10:07 pm to provider [facility] , who verbally acknowledged these results. Electronically Signed   By: Jeannine Boga M.D.   On: 04/29/2020 22:08   MR BRAIN WO CONTRAST  Result Date: 04/29/2020 CLINICAL DATA:  Right-sided leg weakness. EXAM: MRI HEAD WITHOUT CONTRAST TECHNIQUE: Multiplanar, multiecho pulse sequences of the brain and surrounding structures were obtained without intravenous contrast. COMPARISON:  CT studies yesterday.  MRI 08/12/2019. FINDINGS: Brain: Chronic small-vessel ischemic changes affect pons. Changes of Wallerian degeneration on the left. No focal cerebellar finding. Right cerebral hemisphere shows an old small vessel infarction in the thalamus and moderate chronic small-vessel changes of the white matter. Left cerebral hemisphere shows old infarction in the left middle cerebral artery territory with atrophy,  encephalomalacia and gliosis affecting temporal lobe and parietal lobe. Some posterior frontal involvement as well. There are areas of hemosiderin deposition, most prominent at the posterior temporal lobe. I favor that this is related to the old infarction, though there is some potential there could be a more recent hemorrhage in that location, with mild surrounding edema. No obstructive hydrocephalus. Some ex vacuo enlargement of the left lateral ventricle. No extra-axial collection. Vascular: Major vessels at the base of the brain show flow. Skull and upper cervical spine: Negative Sinuses/Orbits: Clear/normal Other: None IMPRESSION: 1. Old infarction in the left middle cerebral artery territory. Atrophy, encephalomalacia and gliosis. Hemosiderin deposition within the region of old infarction. There is a small focus of more prominent hemosiderin deposition at the posterior temporal lobe which could possibly be a more recent hemorrhage/hemorrhagic infarction, surrounded by a small bit of edema. This is located a little bit below and posterior to the old infarction. Because of the susceptibility artifact, I cannot accurately evaluate for restricted diffusion. No gross blood is seen in this location on the CT scan. 2. Chronic small-vessel ischemic changes elsewhere throughout the brain as outlined above. Electronically Signed   By: Nelson Chimes M.D.   On: 04/29/2020 18:10    Impression/Recommendations Active Problems:   Stroke (cerebrum) (HCC)  Orthostatic hypotension Patient with recent CVA and intraparenchymal hemorrhage has orthostatic hypotension which may be intermittently symptomatic and may have caused her earlier loss of consciousness prior to admission. This is a very difficult situation for this patient as we do not want patient to become too hypertensive given history of stroke and intraparenchymal hemorrhage however we also do not want patient to have loss of consciousness due to orthostatic  hypotension.  As a first step, would recommend conservative measures with nonpharmacologic treatment of orthostatic hypotension. Had a long discussion with patient and her husband about the need to get up very slowly when she gets out of bed.  Specifically we discussed the need to stay very well-hydrated especially when they go on there very long walks.  It is essential that patient not get dehydrated. I  also recommend an abdominal binder which the patient should wear anytime that she is being active and is not supine.  I have ordered the abdominal binder. It is notable that patient was not symptomatic during clear episode of blood pressure drop earlier when checked by OT.  If these measures are not effective, would need to revisit outpatient antihypertensives.   Amlodipine is known to cause peripheral vasodilation specifically in the lower extremities. Would clearly want to avoid any diuretics. Would want to avoid any beta-blockers  Would also want to avoid any alpha blockers. Thus management options for hypertension for this patient are quite limited. I do believe that at this point perhaps amlodipine and losartan are her best option so will keep her on these at present.   It may make sense to increase dose of losartan and decreased dose of amlodipine as tolerated so as to minimize the dihydropyridine is much as possible.   This will need to be done slowly as an outpatient.  Lastly, I would not recommend starting Florinef or midodrine in this patient as we would really like to avoid hypertension given history of stroke and intraparenchymal hemorrhage.  Causes of her orthostatic hypotension are not clear but diabetes may be contributory.  She may also be developing have a primary autonomic dysfunction disorder like Shy-Drager syndrome.  Will need close follow-up with neurology as an outpatient.   Thank you for this consultation.  Our [facility] hospitalist team will follow the patient with  you.     Vashti Hey M.D. [facility] 05/01/2020, 5:52 PM Please page [facility] [email or web address] for questions

## 2020-05-01 NOTE — Progress Notes (Signed)
Occupational Therapy Treatment Patient Details Name: Lisa [name] MRN: [id number] DOB: Mar 23, 1950 Today's Date: 05/01/2020    History of present illness Pt is a 70 y.o. female who presents 04/28/20 secondary to a fall in which she lost consciousness and had increased facial droop, aphasia, and R-sided weakness. TPA was administered. Initial imaging negative. Pt being worked up for possible stroke. EEG showing cortical dysfunction arising from left frontotemporal region, nonspecific etiology, likely secondary to underlying encephalomalacia. No seizures or epileptiform discharges were seen throughout the recording. 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   OT comments    05/01/20 1123  Orthostatic Lying   BP- Lying 131/66  Pulse- Lying 88  Orthostatic Sitting  BP- Sitting 144/69  Pulse- Sitting 96  Orthostatic Standing at 0 minutes  BP- Standing at 0 minutes 124/80  Pulse- Standing at 0 minutes 107  Orthostatic Standing at 3 minutes  BP- Standing at 3 minutes (!) 89/55  Pulse- Standing at 3 minutes 112   Pt very orthostatic and lacks symptoms. Educated on static standing and bladder program to decrease falls with urgency to reach the bathroom. Pt adamant about dc home today. Pt reason is that she wants to sleep for 12 hours in her own bed.    Follow Up Recommendations  Home health OT    Equipment Recommendations  None recommended by OT    Recommendations for Other Services PT consult    Precautions / Restrictions Precautions Precautions: Fall Precaution Comments: orthostatic       Mobility Bed Mobility Overal bed mobility: Modified Independent                  Transfers Overall transfer level: Needs assistance   Transfers: Sit to/from Stand Sit to Stand: Min guard              Balance   Sitting-balance support: Bilateral upper  extremity supported;Feet supported Sitting balance-Leahy Scale: Good     Standing balance support: No upper extremity supported Standing balance-Leahy Scale: Fair                             ADL either performed or assessed with clinical judgement   ADL Overall ADL's : Needs assistance/impaired                                       General ADL Comments: see vitals. pt ambulated to door of room min (A) and back with assist to manage lines and leads     Vision       Perception     Praxis      Cognition Arousal/Alertness: Awake/alert Behavior During Therapy: Impulsive Overall Cognitive Status: Impaired/Different from baseline                                 General Comments: pt fixated to d/c home today        Exercises     Shoulder Instructions       General Comments orthostatic with no symptoms    Pertinent Vitals/ Pain       Pain Assessment: No/denies pain  Home Living  Prior Functioning/Environment              Frequency  Min 2X/week        Progress Toward Goals  OT Goals(current goals can now be found in the care plan section)  Progress towards OT goals: Progressing toward goals  Acute Rehab OT Goals Patient Stated Goal: Go home OT Goal Formulation: With patient Time For Goal Achievement: 05/13/20 Potential to Achieve Goals: Good ADL Goals Pt Will Perform Grooming: with modified independence;standing Pt Will Perform Lower Body Dressing: with modified independence;sit to/from stand Pt Will Transfer to Toilet: with modified independence;regular height toilet;ambulating Pt Will Perform Toileting - Clothing Manipulation and hygiene: with modified independence;sitting/lateral leans;sit to/from stand Additional ADL Goal #1: Pt will locate 4/5 grooming items in R visual field with 2-3 cues Additional ADL Goal #2: Pt will demonstrate emergent  awareness during ADLs with Min cues Additional ADL Goal #3: Pt will incorporate RUE 75% of time during ADLs with Min cues  Plan Discharge plan needs to be updated    Co-evaluation                 AM-PAC OT "6 Clicks" Daily Activity     Outcome Measure   Help from another person eating meals?: None Help from another person taking care of personal grooming?: A Little Help from another person toileting, which includes using toliet, bedpan, or urinal?: A Little Help from another person bathing (including washing, rinsing, drying)?: A Little Help from another person to put on and taking off regular upper body clothing?: A Little Help from another person to put on and taking off regular lower body clothing?: A Little 6 Click Score: 19    End of Session Equipment Utilized During Treatment: Gait belt  OT Visit Diagnosis: Unsteadiness on feet (R26.81);Other abnormalities of gait and mobility (R26.89);Muscle weakness (generalized) (M62.81);Hemiplegia and hemiparesis Hemiplegia - Right/Left: Right Hemiplegia - caused by: Cerebral infarction   Activity Tolerance Patient tolerated treatment well   Patient Left with call bell/phone within reach;in bed;with bed alarm set;with family/visitor present   Nurse Communication Mobility status        Time: 0177-9390 OT Time Calculation (min): 32 min  Charges: OT General Charges $OT Visit: 1 Visit OT Treatments $Self Care/Home Management : 23-37 mins   Lisa [name], OTR/L  Acute Rehabilitation Services Pager: [phone] Office: [phone] .    Lisa [name] 05/01/2020, 3:42 PM

## 2020-05-01 NOTE — Progress Notes (Signed)
Pt refusing telemetry monitoring. Pt and husband educated on purpose and reason for the tele order. RN paged night Neuro MD. Will continue to offer and educate, and monitor.

## 2020-05-01 NOTE — Progress Notes (Signed)
Report given to [facility] on 3W. Patient transferred at this time. All belongings taken. Accompanied by husband.  Patient still waiting to see hospitalist. RN aware.

## 2020-05-02 DIAGNOSIS — I629 Nontraumatic intracranial hemorrhage, unspecified: Secondary | ICD-10-CM

## 2020-05-02 DIAGNOSIS — R55 Syncope and collapse: Secondary | ICD-10-CM

## 2020-05-02 DIAGNOSIS — I951 Orthostatic hypotension: Secondary | ICD-10-CM

## 2020-05-02 DIAGNOSIS — I619 Nontraumatic intracerebral hemorrhage, unspecified: Secondary | ICD-10-CM | POA: Insufficient documentation

## 2020-05-02 LAB — BASIC METABOLIC PANEL
Anion gap: 7 (ref 5–15)
BUN: 9 mg/dL (ref 8–23)
CO2: 26 mmol/L (ref 22–32)
Calcium: 9.5 mg/dL (ref 8.9–10.3)
Chloride: 101 mmol/L (ref 98–111)
Creatinine, Ser: 0.57 mg/dL (ref 0.44–1.00)
GFR, Estimated: >60 mL/min (ref >60)
Glucose, Bld: 125 mg/dL — ABNORMAL HIGH (ref 70–99)
Potassium: 3.7 mmol/L (ref 3.5–5.1)
Sodium: 134 mmol/L — ABNORMAL LOW (ref 135–145)

## 2020-05-02 LAB — CBC
HCT: 36 % (ref 36.0–46.0)
Hemoglobin: 11.3 g/dL — ABNORMAL LOW (ref 12.0–15.0)
MCH: 27.3 pg (ref 26.0–34.0)
MCHC: 31.4 g/dL (ref 30.0–36.0)
MCV: 87 fL (ref 80.0–100.0)
Platelets: 189 10*3/uL (ref 150–400)
RBC: 4.14 MIL/uL (ref 3.87–5.11)
RDW: 15.2 % (ref 11.5–15.5)
WBC: 6.3 10*3/uL (ref 4.0–10.5)
nRBC: 0 % (ref 0.0–0.2)

## 2020-05-02 LAB — GLUCOSE, CAPILLARY
Glucose-Capillary: 115 mg/dL — ABNORMAL HIGH (ref 70–99)
Glucose-Capillary: 131 mg/dL — ABNORMAL HIGH (ref 70–99)

## 2020-05-02 MED ORDER — AMLODIPINE BESYLATE 10 MG PO TABS: 5.0000 mg | ORAL_TABLET | Freq: Every day | ORAL | 0 refills | Status: DC

## 2020-05-02 MED ORDER — AMLODIPINE BESYLATE 5 MG PO TABS: 5.0000 mg | ORAL_TABLET | Freq: Every day | ORAL | Status: DC

## 2020-05-02 NOTE — Progress Notes (Addendum)
PT Cancellation Note  Patient Details Name: Lisa [name] MRN: [id number] DOB: [birth year]   Cancelled Treatment:    Reason Eval/Treat Not Completed: Other (comment). No abdominal binder in room at this time. Holding PT session until abdominal binder is present to ensure safe and accurate functional status during session as pt may d/c home later today. Coordinated with nursing staff in regards to obtaining an abdominal binder ASAP and to notify PT once present so PT session can be completed. Will await notification from RN. Will follow-up later today.   Moishe Spice, PT, DPT Acute Rehabilitation Services  Pager: [phone] Office: [place] 05/02/2020, 8:51 AM

## 2020-05-02 NOTE — Progress Notes (Signed)
Physical Therapy Treatment Patient Details Name: Lisa [name] MRN: [id number] DOB: 02/26/1950 Today's Date: 05/02/2020    History of Present Illness Pt is a 70 y.o. female who presents 04/28/20 secondary to a fall in which she lost consciousness and had increased facial droop, aphasia, and R-sided weakness. TPA was administered. Initial imaging negative. Pt being worked up for possible stroke. EEG showing cortical dysfunction arising from left frontotemporal region, nonspecific etiology, likely secondary to underlying encephalomalacia. No seizures or epileptiform discharges were seen throughout the recording. 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    PT Comments    Pt with no signs/symptoms of orthostatic hypotension this date with abdominal binder donned, but her BP did decline with standing (RN and MD notified), see General Comments below. Pt very eager to go home and reports she will likely not be compliant with utilizing her abdominal binder, even after being educated on its importance and her risks. Pt also educated on having someone be near her with mobility at home for safety as she is at risk for falls, which is supported by her DGI score of 17 this date. Pt verbalized understanding but desire to be independent. Pt able to ambulate and navigate stairs with min guard for safety, displaying moments of staggering but ability to recover balance without physical assistance. Changed d/c recs to [facility] PT as long as husband can provide level of care necessary. Will continue to follow acutely.    Follow Up Recommendations  Home health PT;Supervision for mobility/OOB (HHPT if husband can provide necessary level of care)     Equipment Recommendations  None recommended by PT    Recommendations for  Other Services       Precautions / Restrictions Precautions Precautions: Fall;Other (comment) (cognition; poor safety) Precaution Comments: orthostatic Restrictions Weight Bearing Restrictions: No    Mobility  Bed Mobility Overal bed mobility: Modified Independent Bed Mobility: Supine to Sit           General bed mobility comments: Pt able to come to sit safely.    Transfers Overall transfer level: Needs assistance Equipment used: None Transfers: Sit to/from Stand Sit to Stand: Supervision         General transfer comment: Pt able to come to stand with supervision for safety due to mild trunk sway but no overt LOB noted.  Ambulation/Gait Ambulation/Gait assistance: Min guard Gait Distance (Feet): 250 Feet Assistive device: None Gait Pattern/deviations: Step-through pattern;Decreased stride length;Staggering left;Staggering right Gait velocity: reduced Gait velocity interpretation: 1.31 - 2.62 ft/sec, indicative of limited community ambulator General Gait Details: Pt with intermittent extra steps to regain balance during DGI, but no physical assistance needed to regain balance. Min guard for safety.   Stairs Stairs: Yes Stairs assistance: Min guard Stair Management: Two rails;Alternating pattern Number of Stairs: 2 General stair comments: Bil UEs on rails and reciprocal pattern, no overt LOB but mild unsteadiness noted, min guard for safety.   Wheelchair Mobility    Modified Rankin (Stroke Patients Only) Modified Rankin (Stroke Patients Only) Pre-Morbid Rankin Score: Moderate disability Modified Rankin: Moderately severe disability     Balance Overall balance assessment: Needs assistance Sitting-balance support: No upper extremity supported;Feet supported Sitting balance-Leahy Scale: Good Sitting balance - Comments: Pt donns socks EOB with supervision.   Standing balance  support: No upper extremity supported;During functional activity Standing  balance-Leahy Scale: Fair Standing balance comment: Pt able to be mobile without UE support, but needing min guard for safety due to unseatiness noted.                 Standardized Balance Assessment Standardized Balance Assessment : Dynamic Gait Index   Dynamic Gait Index Level Surface: Mild Impairment Change in Gait Speed: Mild Impairment Gait with Horizontal Head Turns: Mild Impairment Gait with Vertical Head Turns: Mild Impairment Gait and Pivot Turn: Normal Step Over Obstacle: Mild Impairment Step Around Obstacles: Mild Impairment Steps: Mild Impairment Total Score: 17      Cognition Arousal/Alertness: Awake/alert Behavior During Therapy: Agitated Overall Cognitive Status: No family/caregiver present to determine baseline cognitive functioning Area of Impairment: Safety/judgement;Awareness;Problem solving                         Safety/Judgement: Decreased awareness of safety;Decreased awareness of deficits Awareness: Anticipatory Problem Solving: Slow processing;Requires verbal cues General Comments: Poor awareness of deficits and safety concerns in that she reports she will not be compliant with wearing the abdominal binder at home, even after being educated on its importance and her risks. Pt unaware of her balance deficits and not remembering whether this is her baseline or not.      Exercises      General Comments General comments (skin integrity, edema, etc.): BP: 157/79 supine; 153/84 sitting; 122/86 standing; 103/50s after standing > 3 min ; 119/78 standing after walking ~250 ft and doing stairs; 144/78 supine end of session      Pertinent Vitals/Pain Pain Assessment: Faces Faces Pain Scale: No hurt Pain Intervention(s): Monitored during session    Home Living                      Prior Function            PT Goals (current goals can now be found in the care plan section) Acute Rehab PT Goals Patient Stated Goal: Go home PT  Goal Formulation: With patient Time For Goal Achievement: 05/13/20 Potential to Achieve Goals: Good Progress towards PT goals: Progressing toward goals    Frequency    Min 4X/week      PT Plan Discharge plan needs to be updated    Co-evaluation              AM-PAC PT "6 Clicks" Mobility   Outcome Measure  Help needed turning from your back to your side while in a flat bed without using bedrails?: A Little Help needed moving from lying on your back to sitting on the side of a flat bed without using bedrails?: A Little Help needed moving to and from a bed to a chair (including a wheelchair)?: A Little Help needed standing up from a chair using your arms (e.g., wheelchair or bedside chair)?: A Little Help needed to walk in hospital room?: A Little Help needed climbing 3-5 steps with a railing? : A Lot 6 Click Score: 17    End of Session Equipment Utilized During Treatment: Gait belt Activity Tolerance: Patient tolerated treatment well Patient left: with call bell/phone within reach;in bed;with bed alarm set Nurse Communication: Mobility status;Other (comment) (BP) PT Visit Diagnosis: Unsteadiness on feet (R26.81);Other abnormalities of gait and mobility (R26.89);Muscle weakness (generalized) (M62.81);History of falling (Z91.81);Difficulty in walking, not elsewhere classified (R26.2);Other symptoms and signs involving the nervous system (R29.898);Hemiplegia and hemiparesis Hemiplegia - Right/Left: Right  Hemiplegia - dominant/non-dominant: Non-dominant Hemiplegia - caused by: Unspecified (prior CVA and unconfirmed CVA this admission)     Time: 8257-4935 PT Time Calculation (min) (ACUTE ONLY): 29 min  Charges:  $Gait Training: 8-22 mins $Therapeutic Activity: 8-22 mins                     Moishe Spice, PT, DPT Acute Rehabilitation Services  Pager: [phone] Office: [place] 05/02/2020, 1:15 PM

## 2020-05-02 NOTE — Progress Notes (Signed)
PROGRESS NOTE  Lisa [name]:[id number] DOB: 15-Jun-1950 DOA: 04/28/2020 PCP: Orma Flaming, MD   LOS: 4 days   Brief Narrative / Interim history: 70 year old female with history of prior left MCA stroke with residual right-sided arm weakness, mild aphasia, DM 2, HTN, bladder cancer came into the hospital with worsening dysarthria, worsening facial droop and inability to walk.  Apparently she fell to the floor, lost consciousness and upon waking up exhibited strokelike symptoms.  She was admitted to the neurology service, work-up in the ED showed a new 1.5 cm intraparenchymal hemorrhage.  Her aspirin has been held.  Patient has been significantly orthostatic for which we were consulted  Subjective / 24h Interval events: She feels well, feels at baseline.  Wants to go home  Assessment & Plan: Principal Problem Orthostatic hypotension-may have been slightly dehydrated as per report she has been eating and drinking that well.  Even though she was orthostatic yesterday she was asymptomatic.  Her blood pressure needs to be tightly controlled given IPH thus would like to avoid pharmacologic agent such as midodrine or Florinef which can increased blood pressure especially when laying supine.  Recommend abdominal binder, SCDs.  Discussed with patient extensively at bedside that she needs to take her time every time she stands up and not rush into start walking right away.  Also discussed about adequate hydration.  She has a blood pressure cuff at home.  She is on 10 of Norvasc as well as 25 of losartan.  Potentially decrease the Norvasc to 5 mg on discharge and have her monitor blood pressure at home and go back to her home dose of her blood pressure goes above 160.  In any way even if she is still orthostatic, as long as she is asymptomatic okay to go home  Active Problems IPH-Per primary Hyperlipidemia-continue statin Essential hypertension-see discussion above History of DVT-noted DM2 -A1c  6.0  Scheduled Meds: . amLODipine  10 mg Oral Daily  . atorvastatin  20 mg Oral Daily  . Chlorhexidine Gluconate Cloth  6 each Topical Daily  . enoxaparin (LOVENOX) injection  40 mg Subcutaneous Q24H  . insulin aspart  0-9 Units Subcutaneous TID WC  . losartan  25 mg Oral Daily  . pantoprazole  40 mg Oral Q supper   Continuous Infusions: PRN Meds:.acetaminophen **OR** acetaminophen (TYLENOL) oral liquid 160 mg/5 mL **OR** acetaminophen, labetalol, senna-docusate  Diet Orders (From admission, onward)    Start     Ordered   04/29/20 0825  Diet Carb Modified Fluid consistency: Thin; Room service appropriate? Yes with Assist  Diet effective now       Question Answer Comment  Diet-HS Snack? Nothing   Calorie Level Medium 1600-2000   Fluid consistency: Thin   Room service appropriate? Yes with Assist      04/29/20 0825          DVT prophylaxis: Place TED hose Start: 05/01/20 0946 enoxaparin (LOVENOX) injection 40 mg Start: 04/30/20 1700 SCD's Start: 04/28/20 2114     Code Status: Full Code  Family Communication: No family at bedside  Procedures:  none  Microbiology  none  Antimicrobials: none    Objective: Vitals:   05/01/20 2054 05/01/20 2327 05/02/20 0400 05/02/20 0851  BP: (!) 142/71 139/67 (!) 145/75 (!) 162/82  Pulse: 75 71    Resp: 18 16 18 18   Temp: 97.7 F (36.5 C) 97.6 F (36.4 C) 98.2 F (36.8 C) 98.4 F (36.9 C)  TempSrc: Oral Oral Oral Oral  SpO2: 99% 98%  98%  Weight:      Height:        Intake/Output Summary (Last 24 hours) at 05/02/2020 1042 Last data filed at 05/02/2020 1005 Gross per 24 hour  Intake 240 ml  Output -  Net 240 ml   Filed Weights   04/28/20 2000 04/28/20 2023  Weight: 74.6 kg 74.6 kg    Examination:  Constitutional: NAD Eyes: no scleral icterus ENMT: Mucous membranes are moist.  Neck: normal, supple Respiratory: clear to auscultation bilaterally, no wheezing, no crackles. Normal respiratory effort. No accessory  muscle use.  Cardiovascular: Regular rate and rhythm, no murmurs / rubs / gallops. No LE edema. Good peripheral pulses Abdomen: non distended, no tenderness. Bowel sounds positive.  Musculoskeletal: no clubbing / cyanosis.  Skin: no rashes Neurologic: No focal deficits  Data Reviewed: I have independently reviewed following labs and imaging studies   CBC: Recent Labs  Lab 04/28/20 2033 04/28/20 2038 04/29/20 0924 04/29/20 1556 04/30/20 0236 05/01/20 0139 05/02/20 0455  WBC 9.8  --   --   --  6.6 7.3 6.3  NEUTROABS 5.5  --   --   --   --   --   --   HGB 12.2   < > 10.9* 11.5* 10.3* 10.8* 11.3*  HCT 39.7   < > 34.7* 35.9* 32.5* 32.8* 36.0  MCV 88.8  --   --   --  86.2 85.9 87.0  PLT 236  --   --   --  183 214 189   < > = values in this interval not displayed.   Basic Metabolic Panel: Recent Labs  Lab 04/28/20 2033 04/28/20 2038 04/29/20 0240 04/30/20 0236 05/01/20 0139 05/02/20 0455  NA 136 138 137 135 134* 134*  K 3.7 3.8 3.8 3.6 3.8 3.7  CL 102 101 106 103 101 101  CO2 24  --  22 25 25 26   GLUCOSE 131* 129* 131* 115* 128* 125*  BUN 15 17 13 8 10 9   CREATININE 0.64 0.90 0.59 0.54 0.55 0.57  CALCIUM 9.6  --  9.2 9.4 9.4 9.5   Liver Function Tests: Recent Labs  Lab 04/28/20 2033  AST 20  ALT 14  ALKPHOS 83  BILITOT 0.8  PROT 6.5  ALBUMIN 3.8   Coagulation Profile: Recent Labs  Lab 04/28/20 2033  INR 1.0   HbA1C: No results for input(s): HGBA1C in the last 72 hours. CBG: Recent Labs  Lab 04/30/20 2143 05/01/20 0752 05/01/20 1134 05/01/20 2107 05/02/20 0611  GLUCAP 135* 133* 156* 151* 115*    Recent Results (from the past 240 hour(s))  Resp Panel by RT-PCR (Flu A&B, Covid) Nasopharyngeal Swab     Status: None   Collection Time: 04/28/20  9:00 PM   Specimen: Nasopharyngeal Swab; Nasopharyngeal(NP) swabs in vial transport medium  Result Value Ref Range Status   SARS Coronavirus 2 by RT PCR NEGATIVE NEGATIVE Final    Comment: (NOTE) SARS-CoV-2  target nucleic acids are NOT DETECTED.  The SARS-CoV-2 RNA is generally detectable in upper respiratory specimens during the acute phase of infection. The lowest concentration of SARS-CoV-2 viral copies this assay can detect is 138 copies/mL. A negative result does not preclude SARS-Cov-2 infection and should not be used as the sole basis for treatment or other patient management decisions. A negative result may occur with  improper specimen collection/handling, submission of specimen other than nasopharyngeal swab, presence of viral mutation(s) within the areas targeted by this assay,  and inadequate number of viral copies(<138 copies/mL). A negative result must be combined with clinical observations, patient history, and epidemiological information. The expected result is Negative.  Fact Sheet for Patients:  EntrepreneurPulse.com.au  Fact Sheet for Healthcare Providers:  IncredibleEmployment.be  This test is no t yet approved or cleared by the [place] FDA and  has been authorized for detection and/or diagnosis of SARS-CoV-2 by FDA under an Emergency Use Authorization (EUA). This EUA will remain  in effect (meaning this test can be used) for the duration of the COVID-19 declaration under Section 564(b)(1) of the Act, 21 U.S.C.section 360bbb-3(b)(1), unless the authorization is terminated  or revoked sooner.       Influenza A by PCR NEGATIVE NEGATIVE Final   Influenza B by PCR NEGATIVE NEGATIVE Final    Comment: (NOTE) The Xpert Xpress SARS-CoV-2/FLU/RSV plus assay is intended as an aid in the diagnosis of influenza from Nasopharyngeal swab specimens and should not be used as a sole basis for treatment. Nasal washings and aspirates are unacceptable for Xpert Xpress SARS-CoV-2/FLU/RSV testing.  Fact Sheet for Patients: EntrepreneurPulse.com.au  Fact Sheet for Healthcare  Providers: IncredibleEmployment.be  This test is not yet approved or cleared by the [place] FDA and has been authorized for detection and/or diagnosis of SARS-CoV-2 by FDA under an Emergency Use Authorization (EUA). This EUA will remain in effect (meaning this test can be used) for the duration of the COVID-19 declaration under Section 564(b)(1) of the Act, 21 U.S.C. section 360bbb-3(b)(1), unless the authorization is terminated or revoked.  Performed at [facility], [place] [address], [place], [place] [address]   MRSA PCR Screening     Status: None   Collection Time: 04/28/20 10:45 PM   Specimen: Nasal Mucosa; Nasopharyngeal  Result Value Ref Range Status   MRSA by PCR NEGATIVE NEGATIVE Final    Comment:        The GeneXpert MRSA Assay (FDA approved for NASAL specimens only), is one component of a comprehensive MRSA colonization surveillance program. It is not intended to diagnose MRSA infection nor to guide or monitor treatment for MRSA infections. Performed at [facility], [place] [address], [place], [place] [address]      Radiology Studies: No results found.   Time spent: 35 minutes, more than 50% at bedside discussing with the patient   Marzetta Board, MD, PhD [facility]  Between 7 am - 7 pm I am available, please contact me via Amion or Securechat  Between 7 pm - 7 am I am not available, please contact night coverage MD/APP via Amion

## 2020-05-02 NOTE — Progress Notes (Signed)
Patient being discharged home.  Patient removed her own IV's.  Discharge information and prescription information given to the patient who verbalized understanding.  Patient to be transported by her husband.

## 2020-05-02 NOTE — Care Management Important Message (Signed)
Important Message  Patient Details  Name: Lisa [name] MRN: [id number] Date of Birth: [birth year]   Medicare Important Message Given:  Yes     [name] [address] 05/02/2020, 3:41 PM

## 2020-05-02 NOTE — Plan of Care (Signed)
  Problem: Education: Goal: Knowledge of disease or condition will improve Outcome: Progressing   Problem: Health Behavior/Discharge Planning: Goal: Ability to manage health-related needs will improve Outcome: Progressing   Problem: Self-Care: Goal: Ability to participate in self-care as condition permits will improve Outcome: Progressing Goal: Verbalization of feelings and concerns over difficulty with self-care will improve Outcome: Progressing Goal: Ability to communicate needs accurately will improve Outcome: Progressing   Problem: Skin Integrity: Goal: Risk for impaired skin integrity will decrease Outcome: Progressing

## 2020-05-02 NOTE — TOC Transition Note (Signed)
Transition of Care [facility]) - CM/SW Discharge Note   Patient Details  Name: Lisa [name] MRN: [id number] Date of Birth: 1950-04-19  Transition of Care [facility]) CM/SW Contact:  [name], RN Phone Number: 05/02/2020, 1:12 PM   Clinical Narrative:    Pt doing better with therapies and plan is to d/c home with resumption of [place] services through [facility]. [place] with [place] updated on admission and d/c with resumption orders.  No new DME needs.  Patient has 24 hour care in the home between her spouse and a caregiver 9-3p daily. Pt has transportation home.   Final next level of care: Home w Home Health Services Barriers to Discharge: No Barriers Identified   Patient Goals and CMS Choice   CMS Medicare.gov Compare Post Acute Care list provided to:: Patient Represenative (must comment) Choice offered to / list presented to : Spouse  Discharge Placement                       Discharge Plan and Services                          HH Arranged: PT,OT,Speech Therapy HH Agency: [facility] ([place]) Date [place]: 05/02/20   Representative spoke with at [place]: [place]  Social Determinants of Health ([place]) Interventions     Readmission Risk Interventions No flowsheet data found.

## 2020-05-02 NOTE — TOC CAGE-AID Note (Signed)
Transition of Care [facility]) - CAGE-AID Screening   Patient Details  Name: Lisa [name] MRN: [id number] Date of Birth: 1950/07/24  [facility]) CM/SW Contact:    [name], RN Phone Number: 05/02/2020, 1:16 PM   Clinical Narrative: Patient states she only drinks alcohol occasionally and she doesn't feel she needs any counseling resources.    CAGE-AID Screening:    Have You Ever Felt You Ought to Cut Down on Your Drinking or Drug Use?: No Have People Annoyed You By Critizing Your Drinking Or Drug Use?: No Have You Felt Bad Or Guilty About Your Drinking Or Drug Use?: No Have You Ever Had a Drink or Used Drugs First Thing In The Morning to Steady Your Nerves or to Get Rid of a Hangover?: No CAGE-AID Score: 0  Substance Abuse Education Offered: Yes (pt refused)

## 2020-05-02 NOTE — Plan of Care (Signed)
  Problem: Education: Goal: Knowledge of disease or condition will improve 05/02/2020 1316 by Caroll Rancher, RN Outcome: Adequate for Discharge 05/02/2020 1227 by Caroll Rancher, RN Outcome: Progressing Goal: Knowledge of secondary prevention will improve Outcome: Adequate for Discharge Goal: Knowledge of patient specific risk factors addressed and post discharge goals established will improve Outcome: Adequate for Discharge Goal: Individualized Educational Video(s) Outcome: Adequate for Discharge   Problem: Coping: Goal: Will verbalize positive feelings about self Outcome: Adequate for Discharge Goal: Will identify appropriate support needs Outcome: Adequate for Discharge   Problem: Health Behavior/Discharge Planning: Goal: Ability to manage health-related needs will improve 05/02/2020 1316 by Caroll Rancher, RN Outcome: Adequate for Discharge 05/02/2020 1227 by Caroll Rancher, RN Outcome: Progressing   Problem: Nutrition: Goal: Risk of aspiration will decrease Outcome: Adequate for Discharge Goal: Dietary intake will improve Outcome: Adequate for Discharge   Problem: Education: Goal: Knowledge of General Education information will improve Description: Including pain rating scale, medication(s)/side effects and non-pharmacologic comfort measures Outcome: Adequate for Discharge

## 2020-05-03 ENCOUNTER — Telehealth (HOSPITAL_COMMUNITY): Payer: Self-pay

## 2020-05-03 NOTE — Telephone Encounter (Signed)
Lisa [name] called and left a vm stating that patient was in the hospital and ivc filter would be canceled. They will call me back to reschedule once she is better. AW

## 2020-05-04 ENCOUNTER — Telehealth: Payer: Self-pay

## 2020-05-04 NOTE — Telephone Encounter (Cosign Needed)
Transition Care Management Unsuccessful Follow-up Telephone Call  Date of discharge and from where:  05/02/20  Attempts:  1st Attempt  Reason for unsuccessful TCM follow-up call:  Left voice message

## 2020-05-06 ENCOUNTER — Telehealth: Payer: Self-pay | Admitting: Physician Assistant

## 2020-05-06 ENCOUNTER — Ambulatory Visit: Payer: Medicare Other | Admitting: Cardiology

## 2020-05-06 NOTE — Telephone Encounter (Signed)
Pt missed an appt. This this due, Lisa [name] was wanting to get a verbal order to see pt next week. Please advise.  Ok to LM on VM if no answer.   Pt was a pt of [facility] and has a TOC scheduled at [facility] at the end of the month.  Please advise

## 2020-05-06 NOTE — Telephone Encounter (Signed)
Called HH and gave verbal order per provider.

## 2020-05-06 NOTE — Telephone Encounter (Signed)
HH needs verbal order to see patient next week. Was a patient of E[facility] Pheasant and has a transfer of care at end of month.

## 2020-05-06 NOTE — Telephone Encounter (Signed)
Ok for verbal order  °

## 2020-05-10 ENCOUNTER — Telehealth: Payer: Self-pay | Admitting: Neurology

## 2020-05-10 NOTE — Telephone Encounter (Signed)
Ok to start aspirin 81 mg daily and schedule appt with me

## 2020-05-10 NOTE — Telephone Encounter (Signed)
This patients husband [name] called in to schedule her hospital follow up appointment today. While on the phone he mentioned that Dr. Erlinda Hong advised him to follow up with Dr. Leonie Man before her appointment to ask about the 81 mg Aspirin and if she should start it back up again? I let him know someone would reach back out to him to help answer this.

## 2020-05-11 NOTE — Telephone Encounter (Signed)
Called patient, advised her per Dr Leonie Man to resume taking Asprin 81 mg daily. Advised her of her FU with him in June. She  verbalized understanding, appreciation.

## 2020-05-12 ENCOUNTER — Encounter: Payer: Medicare Other | Admitting: Family Medicine

## 2020-05-12 ENCOUNTER — Telehealth: Payer: Self-pay

## 2020-05-12 DIAGNOSIS — R269 Unspecified abnormalities of gait and mobility: Secondary | ICD-10-CM | POA: Diagnosis not present

## 2020-05-12 DIAGNOSIS — R531 Weakness: Secondary | ICD-10-CM | POA: Diagnosis not present

## 2020-05-12 NOTE — Telephone Encounter (Signed)
Received call from [facility] with [facility] providing a change in plan of care. Pt is requesting her start of care date be on 4/4 instead of today.

## 2020-05-12 NOTE — Telephone Encounter (Signed)
Please reschedule with another Provider.  Thank You

## 2020-05-16 ENCOUNTER — Encounter: Payer: Self-pay | Admitting: Cardiology

## 2020-05-16 ENCOUNTER — Other Ambulatory Visit: Payer: Self-pay | Admitting: Family Medicine

## 2020-05-17 ENCOUNTER — Other Ambulatory Visit: Payer: Self-pay

## 2020-05-17 DIAGNOSIS — G4700 Insomnia, unspecified: Secondary | ICD-10-CM

## 2020-05-17 DIAGNOSIS — E1165 Type 2 diabetes mellitus with hyperglycemia: Secondary | ICD-10-CM

## 2020-05-17 DIAGNOSIS — IMO0002 Reserved for concepts with insufficient information to code with codable children: Secondary | ICD-10-CM

## 2020-05-17 MED ORDER — TRAZODONE HCL 50 MG PO TABS: 50.0000 mg | ORAL_TABLET | Freq: Every day | ORAL | 0 refills | Status: DC

## 2020-05-17 MED ORDER — METFORMIN HCL ER 500 MG PO TB24: 500.0000 mg | ORAL_TABLET | Freq: Every day | ORAL | 0 refills | Status: DC

## 2020-05-19 ENCOUNTER — Ambulatory Visit (INDEPENDENT_AMBULATORY_CARE_PROVIDER_SITE_OTHER): Payer: Medicare Other | Admitting: Family Medicine

## 2020-05-19 ENCOUNTER — Encounter: Payer: Self-pay | Admitting: Family Medicine

## 2020-05-19 ENCOUNTER — Other Ambulatory Visit: Payer: Self-pay

## 2020-05-19 VITALS — BP 125/69 | HR 83 | Temp 97.8°F | Ht 66.0 in | Wt 161.6 lb

## 2020-05-19 DIAGNOSIS — I779 Disorder of arteries and arterioles, unspecified: Secondary | ICD-10-CM | POA: Diagnosis not present

## 2020-05-19 DIAGNOSIS — Z95828 Presence of other vascular implants and grafts: Secondary | ICD-10-CM | POA: Diagnosis not present

## 2020-05-19 DIAGNOSIS — E1149 Type 2 diabetes mellitus with other diabetic neurological complication: Secondary | ICD-10-CM

## 2020-05-19 DIAGNOSIS — E1165 Type 2 diabetes mellitus with hyperglycemia: Secondary | ICD-10-CM | POA: Diagnosis not present

## 2020-05-19 DIAGNOSIS — Z8673 Personal history of transient ischemic attack (TIA), and cerebral infarction without residual deficits: Secondary | ICD-10-CM | POA: Diagnosis not present

## 2020-05-19 DIAGNOSIS — I619 Nontraumatic intracerebral hemorrhage, unspecified: Secondary | ICD-10-CM | POA: Diagnosis not present

## 2020-05-19 DIAGNOSIS — I951 Orthostatic hypotension: Secondary | ICD-10-CM

## 2020-05-19 DIAGNOSIS — C679 Malignant neoplasm of bladder, unspecified: Secondary | ICD-10-CM

## 2020-05-19 DIAGNOSIS — I10 Essential (primary) hypertension: Secondary | ICD-10-CM

## 2020-05-19 DIAGNOSIS — IMO0002 Reserved for concepts with insufficient information to code with codable children: Secondary | ICD-10-CM

## 2020-05-19 NOTE — Assessment & Plan Note (Signed)
At goal on amlodipine 5 mg daily and losartan 25 mg daily

## 2020-05-19 NOTE — Assessment & Plan Note (Signed)
Has had full excision by urology.  Will be following up with him soon.  No signs of recurrence.

## 2020-05-19 NOTE — Assessment & Plan Note (Signed)
No residual effects.  Will be following up with neurology next week.

## 2020-05-19 NOTE — Patient Instructions (Signed)
It was very nice to see you today!  I am glad that you are doing better.  It is okay for you to stop your Metformin.  I will see you back in 3 to 6 months.  Please come back to see me sooner if needed.  Take care, Dr Jerline Pain  PLEASE NOTE:  If you had any lab tests please let us know if you have not heard back within a few days. You may see your results on mychart before we have a chance to review them but we will give you a call once they are reviewed by [place]. If we ordered any referrals today, please let us know if you have not heard from their office within the next week.   Please try these tips to maintain a healthy lifestyle:   Eat at least 3 REAL meals and 1-2 snacks per day.  Aim for no more than 5 hours between eating.  If you eat breakfast, please do so within one hour of getting up.    Each meal should contain half fruits/vegetables, one quarter protein, and one quarter carbs (no bigger than a computer mouse)   Cut down on sweet beverages. This includes juice, soda, and sweet tea.     Drink at least 1 glass of water with each meal and aim for at least 8 glasses per day   Exercise at least 150 minutes every week.

## 2020-05-19 NOTE — Assessment & Plan Note (Signed)
Will be following up with vascular surgery soon to discuss removal

## 2020-05-19 NOTE — Assessment & Plan Note (Signed)
On aspirin and statin.  Follows with vascular surgery.

## 2020-05-19 NOTE — Assessment & Plan Note (Addendum)
Has done well with medication changes.  We will continue Norvasc 5 mg daily and losartan 25 mg daily.

## 2020-05-19 NOTE — Progress Notes (Signed)
Lisa [name] is a 70 y.o. female who presents today for an office visit.  Assessment/Plan:  Chronic Problems Addressed Today: History of stroke x2 She is on aspirin and statin.  She is following with neurology.  Intracerebral hemorrhage No residual effects.  Will be following up with neurology next week.  Orthostatic hypotension Has done well with medication changes.  We will continue Norvasc 5 mg daily and losartan 25 mg daily.  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  Will be following up with him soon.  No signs of recurrence.  Carotid artery disease ([place]) s/p stent 2021 On aspirin and statin.  Follows with vascular surgery.  DM (diabetes mellitus), type 2, uncontrolled w/neurologic complication (HCC) Last H0Q 6.0.  She would like to stop Metformin today.  I think this is reasonable.  We will recheck in 3 to 6 months.  Hypertension At goal on amlodipine 5 mg daily and losartan 25 mg daily     Subjective:  HPI:  Patient here for hospital follow-up in addition to transfer care.  She was admitted to the ED on 04/28/2020 for code stroke.  She was in her usual state of health when her husband found her in the bedroom facing down.  She was unconscious.  EMS was called.  On arrival to ED she was found to have right leg weakness and ataxia.  She was given TPA.  MRI was negative for acute infarct.  Unfortunately she developed a new left posterior temporal lobe intracranial hemorrhage and a perimesencephalic cistern subarachnoid hemorrhage her symptoms improved and she was discharged home on 05/02/2020 to follow-up with neurology.  During admission her aspirin was held due to her intracranial hemorrhage.  She was noted to have some orthostatic hypotension and her blood pressure medications were changed.  She was switched from Norvasc 10 mg to 5 mg daily.   She was continued on losartan 25 mg daily.  Since being discharged, she has done well.  She has had several medical issues arise over the last 9 months or so.  Initially had a stroke about 9 months ago.  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  She was started on anticoagulation however developed hematuria.  She was found to have bladder cancer which has since been excised by urology.  She had an IVC filter placed and will be following up with vascular surgery soon to discuss removal.       Objective:  Physical Exam: BP 125/69   Pulse 83   Temp 97.8 F (36.6 C) (Temporal)   Ht 5\' 6"  (1.676 m)   Wt 161 lb 9.6 oz (73.3 kg)   SpO2 98%   BMI 26.08 kg/m   Gen: No acute distress, resting comfortably CV: Regular rate and rhythm with no murmurs appreciated Pulm: Normal work of breathing, clear to auscultation bilaterally with no crackles, wheezes, or rhonchi Neuro: Grossly normal, moves all extremities Psych: Normal affect and thought content  Time Spent: 50 minutes of total time was spent on the date of the encounter performing the following actions: chart review prior to seeing the patient including her recent hospitalizations and previous PCP visits, obtaining history, performing a medically necessary exam, counseling on the treatment plan, placing orders, and documenting in our EHR.        Algis Greenhouse. Jerline Pain, MD 05/19/2020 11:41  AM  

## 2020-05-19 NOTE — Assessment & Plan Note (Signed)
Last A1c 6.0.  She would like to stop Metformin today.  I think this is reasonable.  We will recheck in 3 to 6 months.

## 2020-05-19 NOTE — Assessment & Plan Note (Signed)
She is on aspirin and statin.  She is following with neurology.

## 2020-05-23 NOTE — Telephone Encounter (Signed)
Patients hsuband called stating they have no heard anything regarding home health and patient is needing OT PT And speech therapy.   Please advise

## 2020-05-24 ENCOUNTER — Telehealth: Payer: Self-pay

## 2020-05-24 ENCOUNTER — Other Ambulatory Visit: Payer: Self-pay | Admitting: Physician Assistant

## 2020-05-24 NOTE — Telephone Encounter (Signed)
error 

## 2020-05-24 NOTE — Telephone Encounter (Signed)
Verbal OK orders given to [place] at [facility]. Orders will be faxed over to sign.

## 2020-05-24 NOTE — Telephone Encounter (Signed)
Pt states they still have not heard of someone that can do her therapy.

## 2020-05-26 DIAGNOSIS — H25013 Cortical age-related cataract, bilateral: Secondary | ICD-10-CM | POA: Diagnosis not present

## 2020-05-26 DIAGNOSIS — H18413 Arcus senilis, bilateral: Secondary | ICD-10-CM | POA: Diagnosis not present

## 2020-05-26 DIAGNOSIS — H2513 Age-related nuclear cataract, bilateral: Secondary | ICD-10-CM | POA: Diagnosis not present

## 2020-05-26 DIAGNOSIS — H2511 Age-related nuclear cataract, right eye: Secondary | ICD-10-CM | POA: Diagnosis not present

## 2020-05-26 NOTE — Progress Notes (Signed)
Monitor was never returned and marked "lost" in the Rew system. Order will be canceled.

## 2020-05-31 NOTE — Telephone Encounter (Signed)
Patient notified orders placed and someone will contact her soon

## 2020-05-31 NOTE — Telephone Encounter (Signed)
Patient states she has still not heard anything regarding her therapy

## 2020-06-02 DIAGNOSIS — C678 Malignant neoplasm of overlapping sites of bladder: Secondary | ICD-10-CM | POA: Diagnosis not present

## 2020-06-03 ENCOUNTER — Telehealth: Payer: Self-pay

## 2020-06-03 ENCOUNTER — Other Ambulatory Visit: Payer: Self-pay

## 2020-06-03 DIAGNOSIS — Z8673 Personal history of transient ischemic attack (TIA), and cerebral infarction without residual deficits: Secondary | ICD-10-CM

## 2020-06-03 NOTE — Telephone Encounter (Signed)
Ok with me. Please place any necessary orders. 

## 2020-06-03 NOTE — Telephone Encounter (Signed)
Referral has been sent in for new home health.

## 2020-06-03 NOTE — Telephone Encounter (Signed)
Patient called in and stated that she had not heard anything from Home health so I called the [facility] and they said the speech therapist was out of the office for a period of time, also back out of PT. Patient would like a new Home health referral sent in for speech therapy and PT. Please advise.

## 2020-06-03 NOTE — Telephone Encounter (Signed)
Please advise 

## 2020-06-08 ENCOUNTER — Other Ambulatory Visit: Payer: Self-pay | Admitting: *Deleted

## 2020-06-08 NOTE — Addendum Note (Signed)
Addended by: Cranston Neighbor on: 06/08/2020 03:34 PM   Modules accepted: Orders

## 2020-06-09 ENCOUNTER — Other Ambulatory Visit: Payer: Self-pay

## 2020-06-09 DIAGNOSIS — E119 Type 2 diabetes mellitus without complications: Secondary | ICD-10-CM | POA: Diagnosis not present

## 2020-06-09 DIAGNOSIS — I10 Essential (primary) hypertension: Secondary | ICD-10-CM | POA: Diagnosis not present

## 2020-06-09 DIAGNOSIS — E785 Hyperlipidemia, unspecified: Secondary | ICD-10-CM | POA: Diagnosis not present

## 2020-06-09 DIAGNOSIS — Z8673 Personal history of transient ischemic attack (TIA), and cerebral infarction without residual deficits: Secondary | ICD-10-CM

## 2020-06-11 DIAGNOSIS — R531 Weakness: Secondary | ICD-10-CM | POA: Diagnosis not present

## 2020-06-11 DIAGNOSIS — R269 Unspecified abnormalities of gait and mobility: Secondary | ICD-10-CM | POA: Diagnosis not present

## 2020-06-14 DIAGNOSIS — I951 Orthostatic hypotension: Secondary | ICD-10-CM | POA: Diagnosis not present

## 2020-06-14 DIAGNOSIS — R471 Dysarthria and anarthria: Secondary | ICD-10-CM | POA: Diagnosis not present

## 2020-06-14 DIAGNOSIS — I10 Essential (primary) hypertension: Secondary | ICD-10-CM | POA: Diagnosis not present

## 2020-06-14 DIAGNOSIS — E1149 Type 2 diabetes mellitus with other diabetic neurological complication: Secondary | ICD-10-CM | POA: Diagnosis not present

## 2020-06-14 DIAGNOSIS — R4701 Aphasia: Secondary | ICD-10-CM | POA: Diagnosis not present

## 2020-06-14 DIAGNOSIS — I82403 Acute embolism and thrombosis of unspecified deep veins of lower extremity, bilateral: Secondary | ICD-10-CM | POA: Diagnosis not present

## 2020-06-16 ENCOUNTER — Telehealth: Payer: Self-pay

## 2020-06-16 DIAGNOSIS — I82403 Acute embolism and thrombosis of unspecified deep veins of lower extremity, bilateral: Secondary | ICD-10-CM | POA: Diagnosis not present

## 2020-06-16 DIAGNOSIS — R4701 Aphasia: Secondary | ICD-10-CM | POA: Diagnosis not present

## 2020-06-16 DIAGNOSIS — R471 Dysarthria and anarthria: Secondary | ICD-10-CM | POA: Diagnosis not present

## 2020-06-16 DIAGNOSIS — E1149 Type 2 diabetes mellitus with other diabetic neurological complication: Secondary | ICD-10-CM | POA: Diagnosis not present

## 2020-06-16 DIAGNOSIS — I10 Essential (primary) hypertension: Secondary | ICD-10-CM | POA: Diagnosis not present

## 2020-06-16 DIAGNOSIS — I951 Orthostatic hypotension: Secondary | ICD-10-CM | POA: Diagnosis not present

## 2020-06-16 NOTE — Telephone Encounter (Signed)
Earnest Bailey, Occupational Therapy with [facility] called requesting verbal orders for pt. Wanting verbal orders for strengthening and fine motor skills, 1x1 week, 3x2 weeks, 1x1 week. Please advise.

## 2020-06-16 NOTE — Telephone Encounter (Signed)
Gave ok for verbal orders.  

## 2020-06-17 NOTE — Telephone Encounter (Signed)
VO given to [place]

## 2020-06-17 NOTE — Telephone Encounter (Signed)
Ok with me. Please place any necessary orders. 

## 2020-06-17 NOTE — Telephone Encounter (Signed)
Lisa [name] from [facility] [phone] Requesting VO for  PT 1 week one, 2 weeks 2, 1 week 1 for x 2 weeks  Stated Pt do not need OT or nursing, it was cancelled   Requesting evaluation for Speech therapy, patient has difficulty speaking

## 2020-06-21 ENCOUNTER — Other Ambulatory Visit: Payer: Self-pay | Admitting: Student

## 2020-06-21 ENCOUNTER — Telehealth: Payer: Self-pay

## 2020-06-21 DIAGNOSIS — I10 Essential (primary) hypertension: Secondary | ICD-10-CM | POA: Diagnosis not present

## 2020-06-21 DIAGNOSIS — R4701 Aphasia: Secondary | ICD-10-CM | POA: Diagnosis not present

## 2020-06-21 DIAGNOSIS — E1149 Type 2 diabetes mellitus with other diabetic neurological complication: Secondary | ICD-10-CM | POA: Diagnosis not present

## 2020-06-21 DIAGNOSIS — R471 Dysarthria and anarthria: Secondary | ICD-10-CM | POA: Diagnosis not present

## 2020-06-21 DIAGNOSIS — I951 Orthostatic hypotension: Secondary | ICD-10-CM | POA: Diagnosis not present

## 2020-06-21 DIAGNOSIS — I82403 Acute embolism and thrombosis of unspecified deep veins of lower extremity, bilateral: Secondary | ICD-10-CM | POA: Diagnosis not present

## 2020-06-21 NOTE — Telephone Encounter (Signed)
Home Health Verbal Orders  Agency:  [facility]   Requesting Speech   Reason for Request:  Martin Majestic for an evaluation and has a lot of drooping in the right side and having trouble getting her words out.   Frequency:  Twice a week 4 weeks

## 2020-06-21 NOTE — Telephone Encounter (Signed)
Ok to give VO? 

## 2020-06-21 NOTE — Telephone Encounter (Signed)
Jerene Bears number: [phone]    Patient cancelled 2 PT visits this week

## 2020-06-22 ENCOUNTER — Telehealth (HOSPITAL_COMMUNITY): Payer: Self-pay

## 2020-06-22 ENCOUNTER — Ambulatory Visit (HOSPITAL_COMMUNITY): Payer: Medicare Other

## 2020-06-22 ENCOUNTER — Ambulatory Visit (HOSPITAL_COMMUNITY): Admission: RE | Admit: 2020-06-22 | Payer: Medicare Other | Source: Ambulatory Visit

## 2020-06-22 DIAGNOSIS — R471 Dysarthria and anarthria: Secondary | ICD-10-CM | POA: Diagnosis not present

## 2020-06-22 DIAGNOSIS — I951 Orthostatic hypotension: Secondary | ICD-10-CM | POA: Diagnosis not present

## 2020-06-22 DIAGNOSIS — I10 Essential (primary) hypertension: Secondary | ICD-10-CM | POA: Diagnosis not present

## 2020-06-22 DIAGNOSIS — I82403 Acute embolism and thrombosis of unspecified deep veins of lower extremity, bilateral: Secondary | ICD-10-CM | POA: Diagnosis not present

## 2020-06-22 DIAGNOSIS — R4701 Aphasia: Secondary | ICD-10-CM | POA: Diagnosis not present

## 2020-06-22 DIAGNOSIS — E1149 Type 2 diabetes mellitus with other diabetic neurological complication: Secondary | ICD-10-CM | POA: Diagnosis not present

## 2020-06-22 NOTE — Telephone Encounter (Signed)
Returned pt's call to reschedule ivc filter removal, no answer, left vm. AW

## 2020-06-22 NOTE — Telephone Encounter (Signed)
Ok with me. Please place any necessary orders. 

## 2020-06-22 NOTE — Telephone Encounter (Signed)
FYI

## 2020-06-22 NOTE — Telephone Encounter (Signed)
VO given.

## 2020-06-24 DIAGNOSIS — R4701 Aphasia: Secondary | ICD-10-CM | POA: Diagnosis not present

## 2020-06-24 DIAGNOSIS — I10 Essential (primary) hypertension: Secondary | ICD-10-CM | POA: Diagnosis not present

## 2020-06-24 DIAGNOSIS — I82403 Acute embolism and thrombosis of unspecified deep veins of lower extremity, bilateral: Secondary | ICD-10-CM | POA: Diagnosis not present

## 2020-06-24 DIAGNOSIS — I951 Orthostatic hypotension: Secondary | ICD-10-CM | POA: Diagnosis not present

## 2020-06-24 DIAGNOSIS — R471 Dysarthria and anarthria: Secondary | ICD-10-CM | POA: Diagnosis not present

## 2020-06-24 DIAGNOSIS — E1149 Type 2 diabetes mellitus with other diabetic neurological complication: Secondary | ICD-10-CM | POA: Diagnosis not present

## 2020-06-28 DIAGNOSIS — I951 Orthostatic hypotension: Secondary | ICD-10-CM | POA: Diagnosis not present

## 2020-06-28 DIAGNOSIS — R471 Dysarthria and anarthria: Secondary | ICD-10-CM | POA: Diagnosis not present

## 2020-06-28 DIAGNOSIS — R4701 Aphasia: Secondary | ICD-10-CM | POA: Diagnosis not present

## 2020-06-28 DIAGNOSIS — I82403 Acute embolism and thrombosis of unspecified deep veins of lower extremity, bilateral: Secondary | ICD-10-CM | POA: Diagnosis not present

## 2020-06-28 DIAGNOSIS — I10 Essential (primary) hypertension: Secondary | ICD-10-CM | POA: Diagnosis not present

## 2020-06-28 DIAGNOSIS — E1149 Type 2 diabetes mellitus with other diabetic neurological complication: Secondary | ICD-10-CM | POA: Diagnosis not present

## 2020-07-01 DIAGNOSIS — E1149 Type 2 diabetes mellitus with other diabetic neurological complication: Secondary | ICD-10-CM | POA: Diagnosis not present

## 2020-07-01 DIAGNOSIS — I10 Essential (primary) hypertension: Secondary | ICD-10-CM | POA: Diagnosis not present

## 2020-07-01 DIAGNOSIS — R4701 Aphasia: Secondary | ICD-10-CM | POA: Diagnosis not present

## 2020-07-01 DIAGNOSIS — I82403 Acute embolism and thrombosis of unspecified deep veins of lower extremity, bilateral: Secondary | ICD-10-CM | POA: Diagnosis not present

## 2020-07-01 DIAGNOSIS — I951 Orthostatic hypotension: Secondary | ICD-10-CM | POA: Diagnosis not present

## 2020-07-01 DIAGNOSIS — R471 Dysarthria and anarthria: Secondary | ICD-10-CM | POA: Diagnosis not present

## 2020-07-04 DIAGNOSIS — I951 Orthostatic hypotension: Secondary | ICD-10-CM | POA: Diagnosis not present

## 2020-07-04 DIAGNOSIS — I10 Essential (primary) hypertension: Secondary | ICD-10-CM | POA: Diagnosis not present

## 2020-07-04 DIAGNOSIS — R4701 Aphasia: Secondary | ICD-10-CM | POA: Diagnosis not present

## 2020-07-04 DIAGNOSIS — I82403 Acute embolism and thrombosis of unspecified deep veins of lower extremity, bilateral: Secondary | ICD-10-CM | POA: Diagnosis not present

## 2020-07-04 DIAGNOSIS — R471 Dysarthria and anarthria: Secondary | ICD-10-CM | POA: Diagnosis not present

## 2020-07-04 DIAGNOSIS — E1149 Type 2 diabetes mellitus with other diabetic neurological complication: Secondary | ICD-10-CM | POA: Diagnosis not present

## 2020-07-06 DIAGNOSIS — H2511 Age-related nuclear cataract, right eye: Secondary | ICD-10-CM | POA: Diagnosis not present

## 2020-07-06 DIAGNOSIS — H2513 Age-related nuclear cataract, bilateral: Secondary | ICD-10-CM | POA: Diagnosis not present

## 2020-07-07 ENCOUNTER — Telehealth: Payer: Self-pay

## 2020-07-07 DIAGNOSIS — E785 Hyperlipidemia, unspecified: Secondary | ICD-10-CM | POA: Diagnosis not present

## 2020-07-07 DIAGNOSIS — E119 Type 2 diabetes mellitus without complications: Secondary | ICD-10-CM | POA: Diagnosis not present

## 2020-07-07 DIAGNOSIS — I10 Essential (primary) hypertension: Secondary | ICD-10-CM | POA: Diagnosis not present

## 2020-07-07 DIAGNOSIS — H2512 Age-related nuclear cataract, left eye: Secondary | ICD-10-CM | POA: Diagnosis not present

## 2020-07-07 NOTE — Telephone Encounter (Signed)
Lisa [name], physical therapist with [facility] called stating pt is requesting to be discharged from home health PT. Pt is still wanting to work with speech though.

## 2020-07-07 NOTE — Telephone Encounter (Signed)
See note

## 2020-07-08 DIAGNOSIS — I10 Essential (primary) hypertension: Secondary | ICD-10-CM | POA: Diagnosis not present

## 2020-07-08 DIAGNOSIS — E1149 Type 2 diabetes mellitus with other diabetic neurological complication: Secondary | ICD-10-CM | POA: Diagnosis not present

## 2020-07-08 DIAGNOSIS — I82403 Acute embolism and thrombosis of unspecified deep veins of lower extremity, bilateral: Secondary | ICD-10-CM | POA: Diagnosis not present

## 2020-07-08 DIAGNOSIS — R4701 Aphasia: Secondary | ICD-10-CM | POA: Diagnosis not present

## 2020-07-08 DIAGNOSIS — R471 Dysarthria and anarthria: Secondary | ICD-10-CM | POA: Diagnosis not present

## 2020-07-08 DIAGNOSIS — I951 Orthostatic hypotension: Secondary | ICD-10-CM | POA: Diagnosis not present

## 2020-07-08 NOTE — Telephone Encounter (Signed)
Ok with me. Please place any necessary orders. 

## 2020-07-12 DIAGNOSIS — R531 Weakness: Secondary | ICD-10-CM | POA: Diagnosis not present

## 2020-07-12 DIAGNOSIS — R471 Dysarthria and anarthria: Secondary | ICD-10-CM | POA: Diagnosis not present

## 2020-07-12 DIAGNOSIS — E1149 Type 2 diabetes mellitus with other diabetic neurological complication: Secondary | ICD-10-CM | POA: Diagnosis not present

## 2020-07-12 DIAGNOSIS — I951 Orthostatic hypotension: Secondary | ICD-10-CM | POA: Diagnosis not present

## 2020-07-12 DIAGNOSIS — R269 Unspecified abnormalities of gait and mobility: Secondary | ICD-10-CM | POA: Diagnosis not present

## 2020-07-12 DIAGNOSIS — I82403 Acute embolism and thrombosis of unspecified deep veins of lower extremity, bilateral: Secondary | ICD-10-CM | POA: Diagnosis not present

## 2020-07-12 DIAGNOSIS — I10 Essential (primary) hypertension: Secondary | ICD-10-CM | POA: Diagnosis not present

## 2020-07-12 DIAGNOSIS — R4701 Aphasia: Secondary | ICD-10-CM | POA: Diagnosis not present

## 2020-07-12 NOTE — Telephone Encounter (Signed)
VO given, Ok to discharge from Home health. Continue with PT

## 2020-07-13 ENCOUNTER — Telehealth: Payer: Self-pay | Admitting: *Deleted

## 2020-07-13 NOTE — Telephone Encounter (Signed)
Sonal from [facility] requesting VO for Speech therapy 2 time per week for 2 weeks  1 time for 2 weeks   VO given

## 2020-07-14 ENCOUNTER — Telehealth: Payer: Self-pay

## 2020-07-14 ENCOUNTER — Other Ambulatory Visit: Payer: Self-pay | Admitting: Student

## 2020-07-14 ENCOUNTER — Inpatient Hospital Stay: Payer: Medicare Other | Admitting: Neurology

## 2020-07-14 ENCOUNTER — Telehealth (HOSPITAL_COMMUNITY): Payer: Self-pay

## 2020-07-14 DIAGNOSIS — R471 Dysarthria and anarthria: Secondary | ICD-10-CM | POA: Diagnosis not present

## 2020-07-14 DIAGNOSIS — E1149 Type 2 diabetes mellitus with other diabetic neurological complication: Secondary | ICD-10-CM | POA: Diagnosis not present

## 2020-07-14 DIAGNOSIS — I82403 Acute embolism and thrombosis of unspecified deep veins of lower extremity, bilateral: Secondary | ICD-10-CM | POA: Diagnosis not present

## 2020-07-14 DIAGNOSIS — R4701 Aphasia: Secondary | ICD-10-CM | POA: Diagnosis not present

## 2020-07-14 DIAGNOSIS — I951 Orthostatic hypotension: Secondary | ICD-10-CM | POA: Diagnosis not present

## 2020-07-14 DIAGNOSIS — I10 Essential (primary) hypertension: Secondary | ICD-10-CM | POA: Diagnosis not present

## 2020-07-14 NOTE — Telephone Encounter (Signed)
Returned husbands call, no answer, left vm. AW

## 2020-07-14 NOTE — Telephone Encounter (Signed)
Ok with me. Please place any necessary orders. 

## 2020-07-14 NOTE — Telephone Encounter (Signed)
Home Health Verbal Orders  Agency:  [facility] [phone]    Requesting OT/ PT/ Skilled nursing/ Social Work/ Speech:  OT   Frequency:  1 time a week Starting 6/6

## 2020-07-14 NOTE — Telephone Encounter (Signed)
Left voice message to Lisa [name]  ok for verbal orders

## 2020-07-15 ENCOUNTER — Ambulatory Visit (HOSPITAL_COMMUNITY): Admission: RE | Admit: 2020-07-15 | Payer: Medicare Other | Source: Ambulatory Visit

## 2020-07-18 ENCOUNTER — Other Ambulatory Visit: Payer: Self-pay | Admitting: *Deleted

## 2020-07-18 DIAGNOSIS — E1149 Type 2 diabetes mellitus with other diabetic neurological complication: Secondary | ICD-10-CM | POA: Diagnosis not present

## 2020-07-18 DIAGNOSIS — I82403 Acute embolism and thrombosis of unspecified deep veins of lower extremity, bilateral: Secondary | ICD-10-CM | POA: Diagnosis not present

## 2020-07-18 DIAGNOSIS — I951 Orthostatic hypotension: Secondary | ICD-10-CM | POA: Diagnosis not present

## 2020-07-18 DIAGNOSIS — R4701 Aphasia: Secondary | ICD-10-CM | POA: Diagnosis not present

## 2020-07-18 DIAGNOSIS — R471 Dysarthria and anarthria: Secondary | ICD-10-CM | POA: Diagnosis not present

## 2020-07-18 DIAGNOSIS — I10 Essential (primary) hypertension: Secondary | ICD-10-CM | POA: Diagnosis not present

## 2020-07-18 NOTE — Telephone Encounter (Signed)
Verbal orders have been given per message below.

## 2020-07-18 NOTE — Telephone Encounter (Signed)
Lisa [name] requesting refill on Rx Amlodipine 5 mg Last prescribed by historical provider

## 2020-07-19 ENCOUNTER — Other Ambulatory Visit: Payer: Self-pay | Admitting: *Deleted

## 2020-07-19 MED ORDER — AMLODIPINE BESYLATE 10 MG PO TABS: 5.0000 mg | ORAL_TABLET | Freq: Every day | ORAL | 0 refills | Status: DC

## 2020-07-19 NOTE — Telephone Encounter (Signed)
Rx send to [facility]

## 2020-07-19 NOTE — Telephone Encounter (Signed)
Ok with me. Please place any necessary orders. 

## 2020-07-20 DIAGNOSIS — I951 Orthostatic hypotension: Secondary | ICD-10-CM | POA: Diagnosis not present

## 2020-07-20 DIAGNOSIS — I10 Essential (primary) hypertension: Secondary | ICD-10-CM | POA: Diagnosis not present

## 2020-07-20 DIAGNOSIS — R471 Dysarthria and anarthria: Secondary | ICD-10-CM | POA: Diagnosis not present

## 2020-07-20 DIAGNOSIS — I82403 Acute embolism and thrombosis of unspecified deep veins of lower extremity, bilateral: Secondary | ICD-10-CM | POA: Diagnosis not present

## 2020-07-20 DIAGNOSIS — R4701 Aphasia: Secondary | ICD-10-CM | POA: Diagnosis not present

## 2020-07-20 DIAGNOSIS — E1149 Type 2 diabetes mellitus with other diabetic neurological complication: Secondary | ICD-10-CM | POA: Diagnosis not present

## 2020-07-20 NOTE — Telephone Encounter (Signed)
Requesting Rx Losartan  Last refill by historical provider

## 2020-07-21 DIAGNOSIS — I951 Orthostatic hypotension: Secondary | ICD-10-CM | POA: Diagnosis not present

## 2020-07-21 DIAGNOSIS — R4701 Aphasia: Secondary | ICD-10-CM | POA: Diagnosis not present

## 2020-07-21 DIAGNOSIS — I82403 Acute embolism and thrombosis of unspecified deep veins of lower extremity, bilateral: Secondary | ICD-10-CM | POA: Diagnosis not present

## 2020-07-21 DIAGNOSIS — E1149 Type 2 diabetes mellitus with other diabetic neurological complication: Secondary | ICD-10-CM | POA: Diagnosis not present

## 2020-07-21 DIAGNOSIS — I10 Essential (primary) hypertension: Secondary | ICD-10-CM | POA: Diagnosis not present

## 2020-07-21 DIAGNOSIS — R471 Dysarthria and anarthria: Secondary | ICD-10-CM | POA: Diagnosis not present

## 2020-07-21 MED ORDER — LOSARTAN POTASSIUM 50 MG PO TABS: 25.0000 mg | ORAL_TABLET | Freq: Every morning | ORAL | 3 refills | Status: DC

## 2020-07-21 NOTE — Telephone Encounter (Signed)
Medication sent to pharmacy by Dr. Jerline Pain.

## 2020-07-21 NOTE — Telephone Encounter (Signed)
Ok with me. Please place any necessary orders. 

## 2020-07-26 ENCOUNTER — Telehealth: Payer: Self-pay

## 2020-07-26 DIAGNOSIS — I82403 Acute embolism and thrombosis of unspecified deep veins of lower extremity, bilateral: Secondary | ICD-10-CM | POA: Diagnosis not present

## 2020-07-26 DIAGNOSIS — R4701 Aphasia: Secondary | ICD-10-CM | POA: Diagnosis not present

## 2020-07-26 DIAGNOSIS — E1149 Type 2 diabetes mellitus with other diabetic neurological complication: Secondary | ICD-10-CM | POA: Diagnosis not present

## 2020-07-26 DIAGNOSIS — R471 Dysarthria and anarthria: Secondary | ICD-10-CM | POA: Diagnosis not present

## 2020-07-26 DIAGNOSIS — I10 Essential (primary) hypertension: Secondary | ICD-10-CM | POA: Diagnosis not present

## 2020-07-26 DIAGNOSIS — I951 Orthostatic hypotension: Secondary | ICD-10-CM | POA: Diagnosis not present

## 2020-07-26 NOTE — Telephone Encounter (Signed)
Spoke with [place] Silos okay  for verbal orders

## 2020-07-26 NOTE — Telephone Encounter (Signed)
.  Home Health verbal orders-[facility]er[facility]Agency[facility]   Callback number: [phone]  Requesting OT/PT/Skilled nursing/Social Work/Speech: PT   Reason:Resuming PT services   Frequency:

## 2020-07-27 ENCOUNTER — Ambulatory Visit: Payer: Medicare Other | Admitting: Neurology

## 2020-07-27 ENCOUNTER — Encounter: Payer: Self-pay | Admitting: Neurology

## 2020-07-27 VITALS — BP 134/73 | HR 79 | Ht 66.0 in | Wt 161.6 lb

## 2020-07-27 DIAGNOSIS — E119 Type 2 diabetes mellitus without complications: Secondary | ICD-10-CM | POA: Diagnosis not present

## 2020-07-27 DIAGNOSIS — I612 Nontraumatic intracerebral hemorrhage in hemisphere, unspecified: Secondary | ICD-10-CM

## 2020-07-27 DIAGNOSIS — R55 Syncope and collapse: Secondary | ICD-10-CM | POA: Diagnosis not present

## 2020-07-27 DIAGNOSIS — Z8673 Personal history of transient ischemic attack (TIA), and cerebral infarction without residual deficits: Secondary | ICD-10-CM

## 2020-07-27 DIAGNOSIS — E785 Hyperlipidemia, unspecified: Secondary | ICD-10-CM | POA: Diagnosis not present

## 2020-07-27 DIAGNOSIS — I10 Essential (primary) hypertension: Secondary | ICD-10-CM | POA: Diagnosis not present

## 2020-07-27 NOTE — Progress Notes (Signed)
[facility] [address] [place]. [place] [address] [phone]       OFFICE FOLLOW UPVISIT NOTE  [name] Date of Birth:  1950-11-30 Medical Record Number:  [id number]   Referring MD: Rosalin Hawking  Reason for Referral: Stroke HPI: Initial visit 04/18/2020 :[name] is a pleasant 70 year Caucasian lady seen today for initial office consultation for stroke.  She is accompanied by her husband.  History is obtained from them and review of electronic medical records and I personally reviewed pertinent imaging films in PACS.  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  She presented initially on 08/12/2019 as a code stroke with confusion for 5 days prior to admission.  MRI scan showed left MCA subacute infarct and carotid ultrasound showed 80 to 99% left ICA stenosis.  CT angiogram showed near occlusion of the left carotid at the origin of the internal carotid.  2D echo showed linear mobile density on the aortic valve and TEE was suggested but was recommended to be done as an outpatient.  LDL cholesterol 158 mg percent.  Hemoglobin A1c was 8.2.  Urine drug screen was positive for cannabis.  She was started on aspirin and Plavix for 3 months given his intracranial stenosis with CT angiogram showing slow flow in the left M2, distal left MCA vessels and left M2 branch occlusion and left M1 moderate stenosis.  She underwent left carotid TCAR procedure with stenting on 08/20/2019 by vascular surgery.  On 09/18/2019 with sudden onset of right facial droop and mild expressive aphasia and MRI scan showed small acute infarct in the posterior medial left thalamus.  NIH stroke scale was 5 but most of the deficit appeared to be old from previous stroke.  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  Lower extremity venous Doppler showed bilateral DVTs.  Patient was initially started on IV heparin but was stopped for the bladder cancer surgery and plan was to switch to Eliquis on discharge.  Patient however had IVC filter placed.  She now states that she has plans to have the filter removed and in a few weeks.  She is currently on aspirin alone.  She has had no further hematuria.  She has had no recurrent stroke or TIA symptoms.  Her blood pressure is well controlled today it is 138/82.  She is tolerating Lipitor well without any side effects.  She still has some intermittent speech difficulties and some numbness in the right hand off and on.  She is finished home therapies.  Her sister had noted that she has some cognitive issues with decreased judgment and depth perception but the husband feels that these are improving.  She does have easy bruising. Update 07/27/2020: She returns for follow-up after last visit with me 3 months ago.  On 04/28/2020 she was hospitalized to [facility] with episode of brief loss of consciousness.  She was found fallen in the bedroom facedown and unconscious.  There is no tonic-clonic activity incontinence or tongue bite noted.  When she arrived in the ER she was noted to have some right-sided weakness and thought to have a stroke and was given IV tPA.  CT head is negative for LVO and CT angiogram showed 50% right ICA stenosis.  EEG showed EF of  6065% LDL cholesterol 42 mg percent hemoglobin A1c 6.0.  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  There is a tiny amount of perimesencephalic cistern subarachnoid hemorrhage as well.  There is old left MCA infarct.  Aspirin was discontinued.  Patient was found to have orthostatic hypotension and in retrospect episode of loss of consciousness was felt to be a syncopal event.   Patient was advised to do orthostatic tolerance exercises, wear abdominal binder and stay well-hydrated.  She states she is done well since discharge.  Blood pressure has been under good control today it is 134/73.  She has restarted aspirin.  She did follow-up with vascular surgeon Dr. Donzetta Matters who did a follow-up carotid ultrasound and seemed satisfied without significant restenosis in the stent.  She plans to have IVC filter removed next week.  She states she is back to baseline and has no complaints today. ROS:   14 system review of systems is positive for numbness, weakness, speech and word finding difficulty, word hesitancy all other systems negative  PMH:  Past Medical History:  Diagnosis Date   Anemia    Cancer ([place])    Carotid arterial disease ([place]) 07/2019   Diabetes ([place])    HTN (hypertension)    Hyperlipidemia    Stroke ([facility])    acute/subacute left MCA infarct 08/12/19    Social History:  Social History   Socioeconomic History   Marital status: Married    Spouse name: Not on file   Number of children: Not on file   Years of education: Not on file   Highest education level: Not on file  Occupational History   Not on file  Tobacco Use   Smoking status: Former    Packs/day: 0.50    Pack years: 0.00    Types: Cigarettes    Quit date: 07/28/2019    Years since quitting: 1.0   Smokeless tobacco: Never  Vaping Use   Vaping Use: Never used  Substance and Sexual Activity   Alcohol use: Yes    Comment: occ   Drug use: Never   Sexual activity: Yes  Other Topics Concern   Not on file  Social History Narrative   Lives Independent Living with husband, [place] in [place].    Left Handed   Drinks no caffeine   Social Determinants of Radio broadcast assistant Strain: Not on file  Food Insecurity: Not on file  Transportation Needs: Not on file  Physical Activity: Not on file  Stress: Not on file  Social Connections: Not on file  Intimate Partner Violence: Not on  file    Medications:   Current Outpatient Medications on File Prior to Visit  Medication Sig Dispense Refill   amLODipine (NORVASC) 10 MG tablet Take 0.5 tablets (5 mg total) by mouth daily. 30 tablet 0   aspirin EC 81 MG tablet Take 81 mg by mouth daily. Swallow whole.     atorvastatin (LIPITOR) 20 MG tablet Take 1 tablet (20 mg total) by mouth daily. 90 tablet 1   docusate sodium (COLACE) 100 MG capsule Take 1 capsule (100 mg total) by mouth daily. (Patient taking differently: Take 100 mg by mouth daily as needed for mild constipation.) 90 capsule 1   losartan (COZAAR) 50 MG tablet Take 0.5 tablets (25 mg total) by mouth every morning. 90 tablet 3   No current facility-administered medications on file  prior to visit.    Allergies:  No Known Allergies  Physical Exam General: Mildly obese middle-aged Caucasian lady, seated, in no evident distress Head: head normocephalic and atraumatic.   Neck: supple with no carotid or supraclavicular bruits Cardiovascular: regular rate and rhythm, no murmurs Musculoskeletal: no deformity Skin:  no rash/petichiae Vascular:  Normal pulses all extremities  Neurologic Exam Mental Status: Awake and fully alert. Oriented to place and time. Recent and remote memory intact. Attention span, concentration and fund of knowledge appropriate. Mood and affect appropriate.  Speech mostly fluent with occasional hesitancy and word finding difficulty.  No paraphasic errors.  Good comprehension. Cranial Nerves: Fundoscopic exam reveals sharp disc margins. Pupils equal, briskly reactive to light. Extraocular movements full without nystagmus. Visual fields show partial right homonymous hemianopsia  to confrontation. Hearing intact. Facial sensation intact.  Mild right nasolabial fold asymmetry., tongue, palate moves normally and symmetrically.  Motor: Normal bulk and tone. Normal strength in all tested extremity muscles except mild weakness of right grip and intrinsic hand  muscles.  Diminished fine finger movements on the right.  Orbits left over right upper extremity.. Sensory.: intact to touch , pinprick , position and vibratory sensation.  Subjective paresthesias in the right hand but no objective sensory loss. Coordination: Rapid alternating movements normal in all extremities. Finger-to-nose and heel-to-shin performed accurately bilaterally. Gait and Station: Arises from chair without difficulty. Stance is normal. Gait demonstrates normal stride length and balance . Able to heel, toe and tandem walk without difficulty.  Reflexes: 1+ and symmetric. Toes downgoing.   NIHSS 2 Modified Rankin  2  ASSESSMENT: 70 year old Caucasian lady with symptomatic left hemispheric MCA branch infarct in June 2021 from severe proximal carotid stenosis as well as recurrent small stroke in August 2021 with vascular risk factors of carotid stenosis, hypertension, hyperlipidemia, diabetes and mild obesity.  She may have mild cognitive impairment post stroke.  Recent episode of brief loss of consciousness on 04/28/2020 likely a syncopal event due to orthostatic hypotension followed by transient right hemiparesis which is likely exacerbation of old deficits.  She was given tPA but MRI was negative for stroke and there is small 1 cm left temporal lobe hemorrhage is post tPA complication from which she is doing well     PLAN: I had a long d/w patient and her husband about her recent hospitalization for likely syncopal event, post TPA brain hemorrhage, remote stroke, risk for recurrent stroke/TIAs, personally independently reviewed imaging studies and stroke evaluation results and answered questions.Continue aspirin 81 mg daily  for secondary stroke prevention and maintain strict control of hypertension with blood pressure goal below 130/90, diabetes with hemoglobin A1c goal below 6.5% and lipids with LDL cholesterol goal below 70 mg/dL. I also advised the patient to eat a healthy diet with  plenty of whole grains, cereals, fruits and vegetables, exercise regularly and maintain ideal body weight .I advised her to do orthostatic tolerance exercises if she has been sitting or lying down for more than 20 minutes prior to getting up.  I also asked her to maintain adequate hydration and drink at least 6 to 8 glasses of liquids a day.  Followup in the future with me in 6 months or call earlier if necessary. Greater than 50% time during this 45-minute   visit was spent on counseling and coordination of care about her strokes and symptomatic carotid stenosis and discussion about stroke risk and prevention and treatment and answering questions. Antony Contras, [facility] [phone]  [place] [place] [address] [place], [place] [address]  Phone [phone] Fax [phone] Note: This document was prepared with digital dictation and possible smart phrase technology. Any transcriptional errors that result from this process are unintentional.

## 2020-07-27 NOTE — Patient Instructions (Signed)
I had a long d/w patient and her husband about her recent hospitalization for likely syncopal event, post TPA brain hemorrhage, remote stroke, risk for recurrent stroke/TIAs, personally independently reviewed imaging studies and stroke evaluation results and answered questions.Continue aspirin 81 mg daily  for secondary stroke prevention and maintain strict control of hypertension with blood pressure goal below 130/90, diabetes with hemoglobin A1c goal below 6.5% and lipids with LDL cholesterol goal below 70 mg/dL. I also advised the patient to eat a healthy diet with plenty of whole grains, cereals, fruits and vegetables, exercise regularly and maintain ideal body weight .I advised her to do orthostatic tolerance exercises if she has been sitting or lying down for more than 20 minutes prior to getting up.  I also asked her to maintain adequate hydration and drink at least 6 to 8 glasses of liquids a day.  Followup in the future with me in 6 months or call earlier if necessary.

## 2020-07-28 ENCOUNTER — Other Ambulatory Visit: Payer: Self-pay | Admitting: Radiology

## 2020-07-28 DIAGNOSIS — R471 Dysarthria and anarthria: Secondary | ICD-10-CM | POA: Diagnosis not present

## 2020-07-28 DIAGNOSIS — I82403 Acute embolism and thrombosis of unspecified deep veins of lower extremity, bilateral: Secondary | ICD-10-CM | POA: Diagnosis not present

## 2020-07-28 DIAGNOSIS — R4701 Aphasia: Secondary | ICD-10-CM | POA: Diagnosis not present

## 2020-07-28 DIAGNOSIS — I951 Orthostatic hypotension: Secondary | ICD-10-CM | POA: Diagnosis not present

## 2020-07-28 DIAGNOSIS — E1149 Type 2 diabetes mellitus with other diabetic neurological complication: Secondary | ICD-10-CM | POA: Diagnosis not present

## 2020-07-28 DIAGNOSIS — I10 Essential (primary) hypertension: Secondary | ICD-10-CM | POA: Diagnosis not present

## 2020-07-29 ENCOUNTER — Other Ambulatory Visit: Payer: Self-pay | Admitting: Student

## 2020-07-29 ENCOUNTER — Telehealth: Payer: Self-pay

## 2020-07-29 NOTE — Telephone Encounter (Signed)
.  Home Health verbal orders-caller/Agency: [facility]   Callback number: [phone]  Requesting OT/PT/Skilled nursing/Social Work/Speech:Speech    Reason: Had stroke   Frequency: 2 x week for 2 weeks

## 2020-07-29 NOTE — Telephone Encounter (Signed)
Spoke with Lisa [name] given ok for verbal

## 2020-08-01 ENCOUNTER — Other Ambulatory Visit: Payer: Self-pay

## 2020-08-01 ENCOUNTER — Ambulatory Visit (HOSPITAL_COMMUNITY)
Admission: RE | Admit: 2020-08-01 | Discharge: 2020-08-01 | Disposition: A | Discharge status: Home / Self Care | Payer: Medicare Other | Source: Ambulatory Visit | Attending: Interventional Radiology | Admitting: Interventional Radiology

## 2020-08-01 ENCOUNTER — Encounter (HOSPITAL_COMMUNITY): Payer: Self-pay

## 2020-08-01 DIAGNOSIS — Z4589 Encounter for adjustment and management of other implanted devices: Secondary | ICD-10-CM | POA: Insufficient documentation

## 2020-08-01 DIAGNOSIS — Z87891 Personal history of nicotine dependence: Secondary | ICD-10-CM | POA: Insufficient documentation

## 2020-08-01 DIAGNOSIS — Z7982 Long term (current) use of aspirin: Secondary | ICD-10-CM | POA: Insufficient documentation

## 2020-08-01 DIAGNOSIS — Z79899 Other long term (current) drug therapy: Secondary | ICD-10-CM | POA: Diagnosis not present

## 2020-08-01 DIAGNOSIS — C688 Malignant neoplasm of overlapping sites of urinary organs: Secondary | ICD-10-CM | POA: Diagnosis not present

## 2020-08-01 DIAGNOSIS — Z8673 Personal history of transient ischemic attack (TIA), and cerebral infarction without residual deficits: Secondary | ICD-10-CM | POA: Diagnosis not present

## 2020-08-01 DIAGNOSIS — Z452 Encounter for adjustment and management of vascular access device: Secondary | ICD-10-CM | POA: Diagnosis not present

## 2020-08-01 DIAGNOSIS — Z95828 Presence of other vascular implants and grafts: Secondary | ICD-10-CM

## 2020-08-01 HISTORY — PX: IR IVC FILTER RETRIEVAL / S&I /IMG GUID/MOD SED: IMG5308

## 2020-08-01 LAB — GLUCOSE, CAPILLARY: Glucose-Capillary: 126 mg/dL — ABNORMAL HIGH (ref 70–99)

## 2020-08-01 IMAGING — US IR IVC FILTER RETRIEVAL / S&I /IMG GUID/MOD SED
4 series · 12 of 12 positions shown · non-contrast
Comparison: none

INDICATION: 69-year-old female presents for IVC filter retrieval

[Series 1: ir ivc filter retrieval / s&i /img guid/mod sed · 1 of 1 slices shown]
[im 1/1]
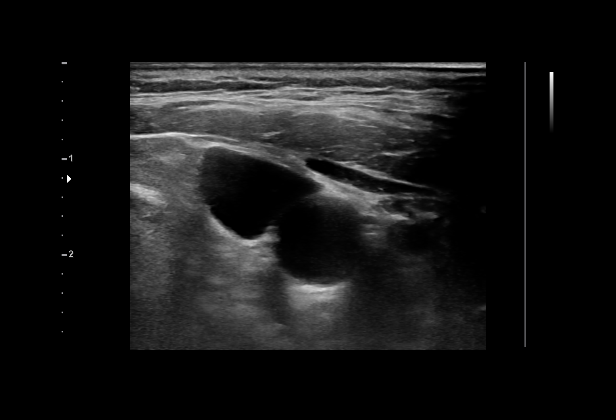

[Series 1: body 4 care · 2 acquisitions, 5 frames shown (1 of 2)]
[im 1/2]
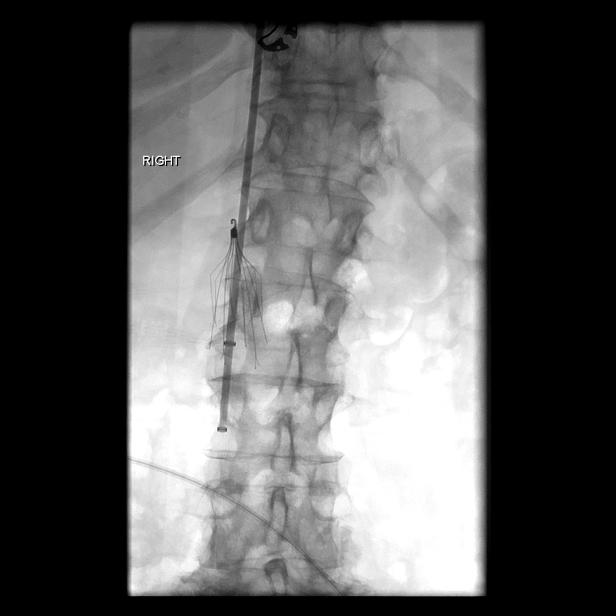
[im 1/2]
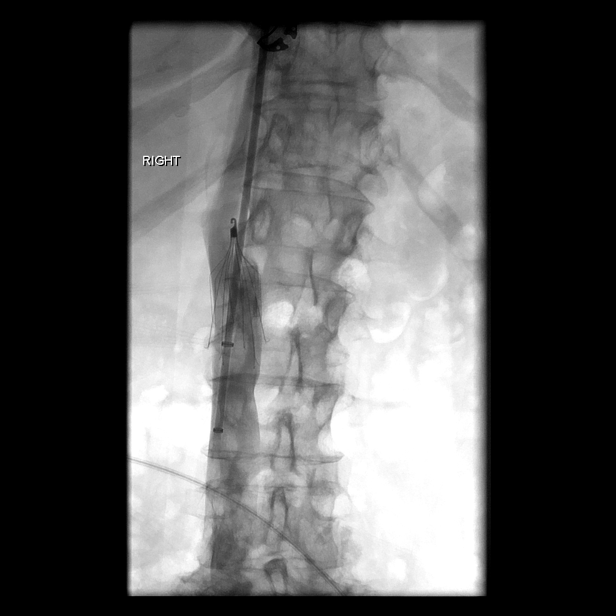
[im 1/2]
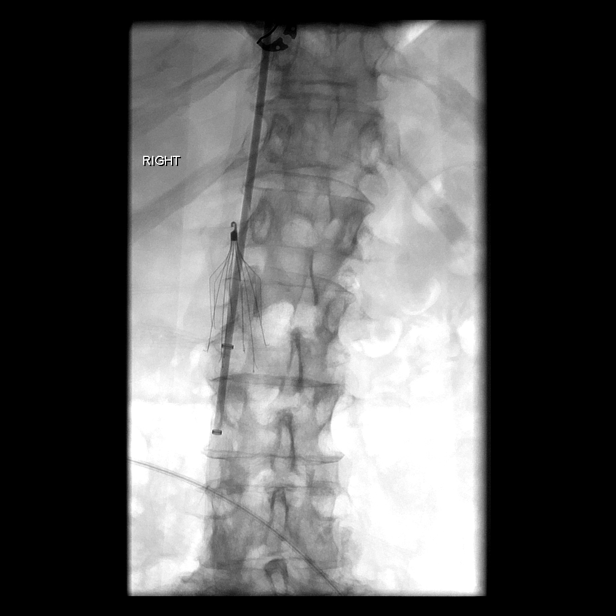
[im 1/2]
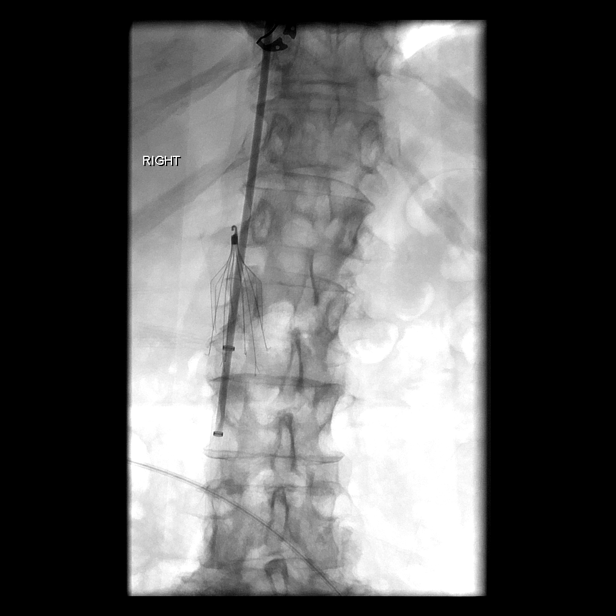
[im 2/2]
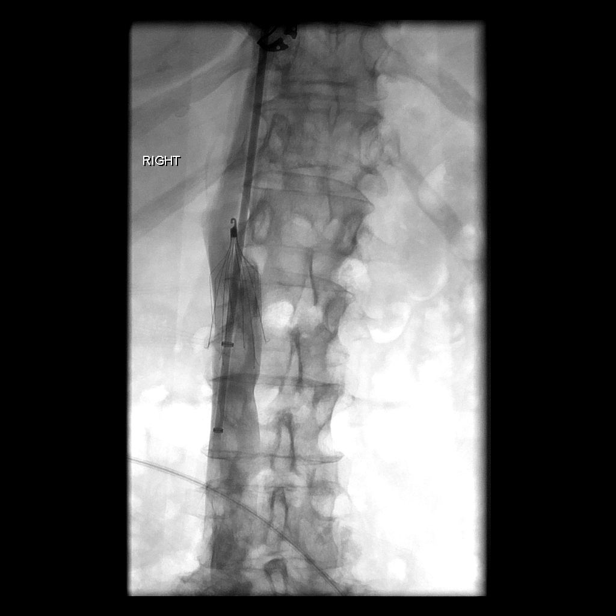

[Series 2: fl (-) angio · 1 of 1 slices shown]
[im 1/1]
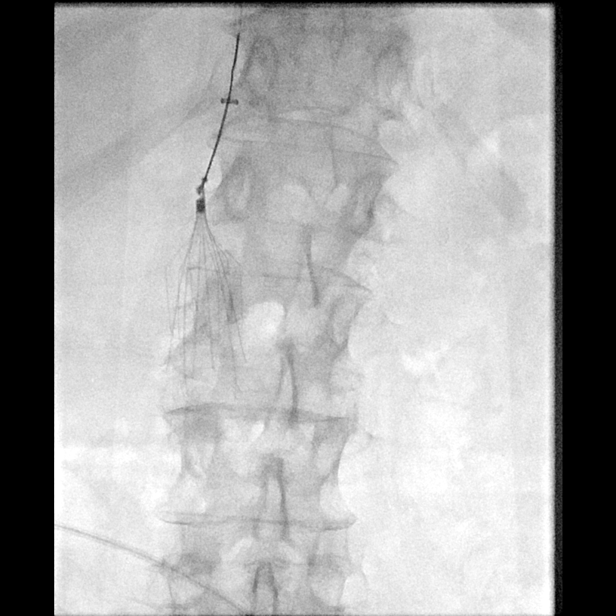

[Series 3: body 4 care · 2 acquisitions, 5 frames shown (2 of 2)]
[im 1/2]
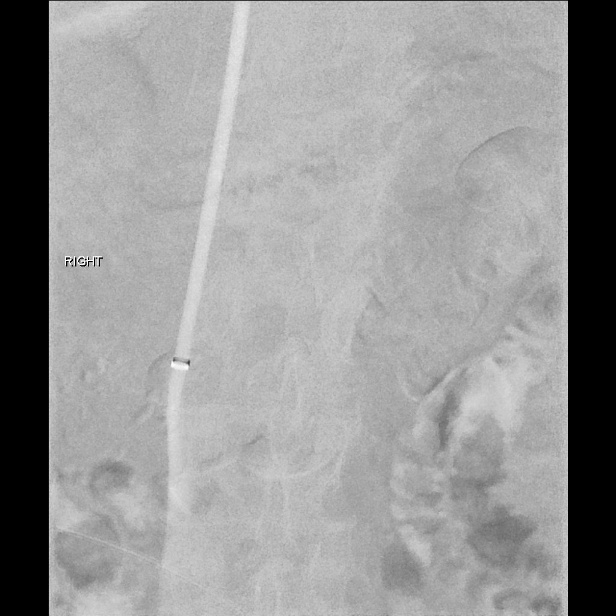
[im 1/2]
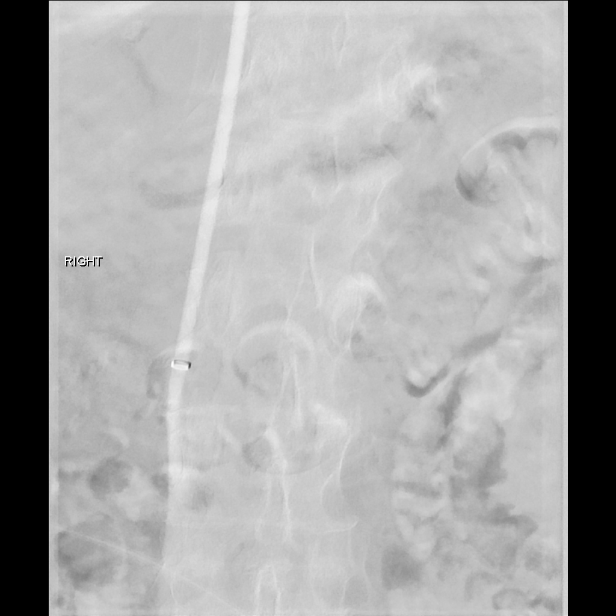
[im 1/2]
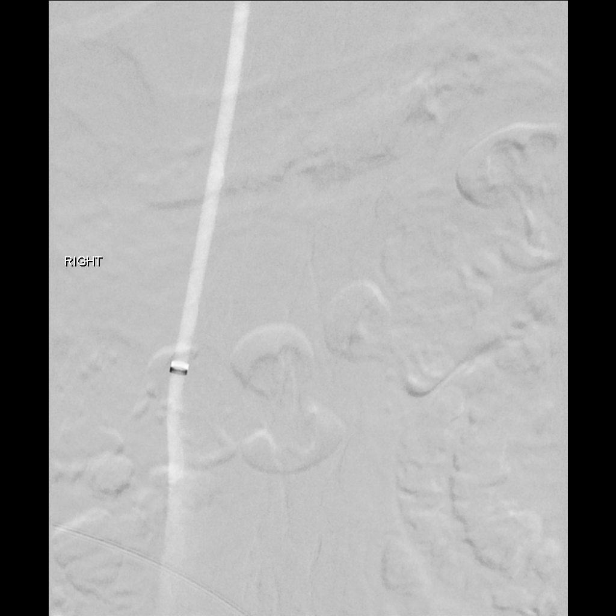
[im 1/2]
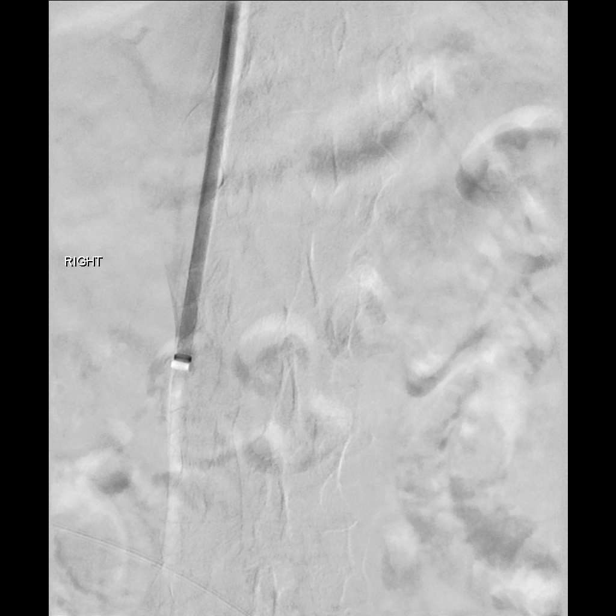
[im 2/2]
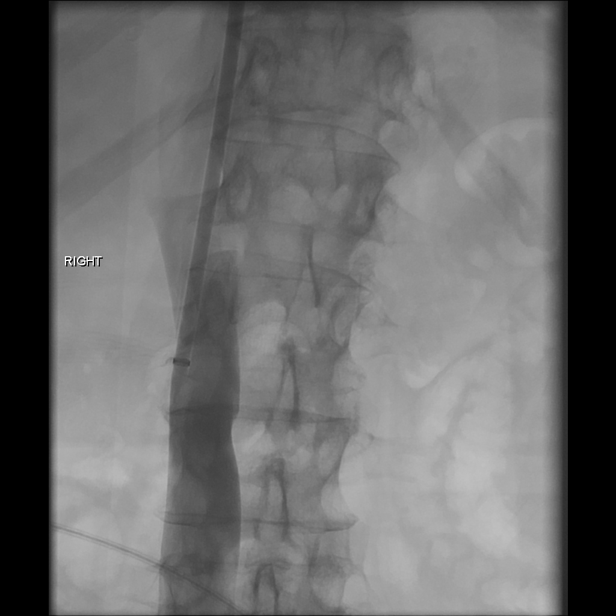

[12 of 12 positions shown; findings below may reference images not displayed]

EXAM:
ULTRASOUND-GUIDED ACCESS RIGHT INTERNAL JUGULAR VEIN

IMAGE GUIDED RETRIEVAL OF IVC FILTER

MEDICATIONS:
None.

ANESTHESIA/SEDATION:
1.0 mg IV Versed; 25 mcg IV Fentanyl

Moderate Sedation Time:  13 minutes

The patient was continuously monitored during the procedure by the
interventional radiology nurse under my direct supervision.

FLUOROSCOPY TIME:  Fluoroscopy Time: 1 minutes 30 seconds (48 mGy).

COMPLICATIONS:
None

PROCEDURE:
Informed written consent was obtained from the patient after a
thorough discussion of the procedural risks, benefits and
alternatives. All questions were addressed. Maximal Sterile Barrier
Technique was utilized including caps, mask, sterile gowns, sterile
gloves, sterile drape, hand hygiene and skin antiseptic. A timeout
was performed prior to the initiation of the procedure

The patient was prepped with Betadine in a sterile fashion, and a
sterile drape was applied covering the operative field. A sterile
gown and sterile gloves were used for the procedure. Local
anesthesia was provided with 1% Lidocaine.

Under direct ultrasound guidance, a 21 gauge needle was advanced
into the right internal jugular vein with ultrasound image
documentation performed. After securing access with a micropuncture
dilator, a guidewire was advanced to the IVC. Soft tissue dilation
was achieved.

JANSSEN IVC filter retrieval system was advanced to the IVC, below the
filter, with small contrast injection confirming location within the
IVC.

IVC cavagram was then performed.

The snare kit was then used to successfully snare and remove the
JANSSEN filter.

Final venogram was performed.

Visual inspection of the IVC filter demonstrated no fractured legs,
and was removed in its entirety.

Patient tolerated the procedure well and remained hemodynamically
stable throughout.

No complications were encountered and no significant blood loss was
encountered.
IMPRESSION: Status post ultrasound-guided access right internal jugular vein for
IVC filter retrieval.

PLAN:
Filter successfully retrieved

## 2020-08-01 MED ORDER — IOHEXOL 300 MG/ML  SOLN
50.0000 mL | Freq: Once | INTRAMUSCULAR | Status: AC | PRN
  Administered 2020-08-01: 20 mL

## 2020-08-01 MED ORDER — LIDOCAINE HCL 1 % IJ SOLN
INTRAMUSCULAR | Status: AC | PRN
  Administered 2020-08-01: 10 mL

## 2020-08-01 MED ORDER — LIDOCAINE HCL (PF) 1 % IJ SOLN
INTRAMUSCULAR | Status: AC
  Filled 2020-08-01: qty 30

## 2020-08-01 MED ORDER — MIDAZOLAM HCL 2 MG/2ML IJ SOLN
INTRAMUSCULAR | Status: AC | PRN
  Administered 2020-08-01: 1 mg via INTRAVENOUS

## 2020-08-01 MED ORDER — MIDAZOLAM HCL 2 MG/2ML IJ SOLN
INTRAMUSCULAR | Status: AC
  Filled 2020-08-01: qty 2

## 2020-08-01 MED ORDER — SODIUM CHLORIDE 0.9 % IV SOLN: INTRAVENOUS | Status: DC

## 2020-08-01 MED ORDER — FENTANYL CITRATE (PF) 100 MCG/2ML IJ SOLN
INTRAMUSCULAR | Status: AC
  Filled 2020-08-01: qty 2

## 2020-08-01 MED ORDER — FENTANYL CITRATE (PF) 100 MCG/2ML IJ SOLN
INTRAMUSCULAR | Status: AC | PRN
  Administered 2020-08-01: 25 ug via INTRAVENOUS

## 2020-08-01 NOTE — H&P (Signed)
Chief Complaint: Patient was seen in consultation today for inferior vena cava filter retrieval at the request of Dr Lisa [name]  Supervising Physician: Lisa [name]  Patient Status: [facility] - Out-pt  History of Present Illness: Lisa [name] is a 70 y.o. female    Pt suffered CVA 08/12/19- residual cognitive impairment-- mild but definitely apparent Follows with Dr Lisa [name]  B LE DVT 09/20/19- on Doppler Started on heparin  Known --high-grade T1 urothelial cell carcinoma - post surgical gross hematuria and clot retention Anticoagulation stopped Retrievable Inferior vena cava filter placed 10/07/19.  Was seen in consult with Dr Lisa [name] for removal 03/22/20   Lisa [name] was hospitalized last June of 2021 with acute stroke, ultimately being treated with intensive medical therapy and carotid stent for symptomatic ICA stenosis.  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  She was hospitalized for a short course at this time, then [facility] 09/07/19.  She returned to the ED with more hematuria, abdominal pain, 09/17/19. She was hospitalized again, and during this stay, duplex of the bilateral lower extremity was performed. This result: Right: DVT of FV, popliteal vein, PT and peroneal vv. Left: DVT of popliteal vein, PT vein, peroneal vv.   Anticoagulation was started, but she had further hospitalization for hematuria and acute blood loss anemia, hospitalized through the ED 10/05/19.  AC was withdrawn, and retrievable IVC filter was placed 10/07/19. [place]:  IMPRESSION: Sonographic survey of the bilateral lower extremities negative for DVT  Was intended to have retrieval asap-- but pt has had several medical issues per husband--- and have not been able to scheduled until now.  Here today for IVC filter retrieval  Past Medical History:  Diagnosis Date   Anemia    Cancer  ([place])    Carotid arterial disease ([place]) 07/2019   Diabetes ([place])    HTN (hypertension)    Hyperlipidemia    Stroke [facility])    acute/subacute left MCA infarct 08/12/19    Past Surgical History:  Procedure Laterality Date   CYSTOSCOPY WITH FULGERATION N/A 09/01/2019   Procedure: CYSTOSCOPY CLOT EVACUATION WITH POSSIBLE FULGERATION;  Surgeon: Robley Fries, MD;  Location: [facility];  Service: Urology;  Laterality: N/A;   CYSTOSCOPY WITH FULGERATION N/A 09/28/2019   Procedure: CYSTOSCOPY WITH FULGERATION AND CLOT EVACUATION;  Surgeon: Robley Fries, MD;  Location: [facility];  Service: Urology;  Laterality: N/A;   IR IVC FILTER PLMT / S&I /IMG GUID/MOD SED  10/07/2019   IR RADIOLOGIST EVAL & MGMT  03/22/2020   TONSILLECTOMY     TRANSCAROTID ARTERY REVASCULARIZATION  Left 08/20/2019   Procedure: LEFT TRANSCAROTID ARTERY REVASCULARIZATION;  Surgeon: Waynetta Sandy, MD;  Location: [facility];  Service: Vascular;  Laterality: Left;   TRANSURETHRAL RESECTION OF BLADDER TUMOR WITH MITOMYCIN-C N/A 09/22/2019   Procedure: TRANSURETHRAL RESECTION OF BLADDER TUMOR WITH INTRAVESICAL GEMCITABINE;  Surgeon: Robley Fries, MD;  Location: [facility];  Service: Urology;  Laterality: N/A;  90 MINS    Allergies: Patient has no known allergies.  Medications: Prior to Admission medications   Medication Sig Start Date End Date Taking? Authorizing Provider  amLODipine (NORVASC) 10 MG tablet Take 0.5 tablets (5 mg total) by mouth daily. 07/19/20  Yes Vivi Barrack, MD  aspirin EC 81 MG tablet Take 81 mg by mouth daily. Swallow whole.   Yes [provider]  atorvastatin (LIPITOR) 20  MG tablet Take 1 tablet (20 mg total) by mouth daily. 03/01/20  Yes Brunetta Jeans, PA-C  Biotin w/ Vitamins C & E (HAIR/SKIN/NAILS PO) Take 1 tablet by mouth daily.   Yes [provider]  docusate sodium (COLACE) 100 MG capsule Take 1 capsule (100 mg total) by mouth daily. 03/01/20  Yes Brunetta Jeans, PA-C   losartan (COZAAR) 50 MG tablet Take 0.5 tablets (25 mg total) by mouth every morning. 07/21/20  Yes Vivi Barrack, MD  PROLENSA 0.07 % SOLN Place 1 drop into the right eye at bedtime. 07/14/20  Yes [provider]     Family History  Problem Relation Age of Onset   Hypertension Mother    Lung cancer Father     Social History   Socioeconomic History   Marital status: Married    Spouse name: Not on file   Number of children: Not on file   Years of education: Not on file   Highest education level: Not on file  Occupational History   Not on file  Tobacco Use   Smoking status: Former    Packs/day: 0.50    Pack years: 0.00    Types: Cigarettes    Quit date: 07/28/2019    Years since quitting: 1.0   Smokeless tobacco: Never  Vaping Use   Vaping Use: Never used  Substance and Sexual Activity   Alcohol use: Yes    Comment: occ   Drug use: Never   Sexual activity: Yes  Other Topics Concern   Not on file  Social History Narrative   Lives Independent Living with husband, [place] in [place].    Left Handed   Drinks no caffeine   Social Determinants of Radio broadcast assistant Strain: Not on file  Food Insecurity: Not on file  Transportation Needs: Not on file  Physical Activity: Not on file  Stress: Not on file  Social Connections: Not on file     Review of Systems: A 12 point ROS discussed and pertinent positives are indicated in the HPI above.  All other systems are negative.    Vital Signs: BP (!) 189/87 (BP Location: Right Arm)   Pulse 80   Temp (!) 97.5 F (36.4 C) (Oral)   Resp 16   Ht 5\' 6"  (1.676 m)   Wt 164 lb (74.4 kg)   SpO2 99%   BMI 26.47 kg/m   Physical Exam HENT:     Mouth/Throat:     Mouth: Mucous membranes are moist.  Cardiovascular:     Rate and Rhythm: Normal rate and regular rhythm.     Heart sounds: Normal heart sounds.  Pulmonary:     Effort: Pulmonary effort is normal.     Breath sounds: Normal breath sounds.   Abdominal:     Palpations: Abdomen is soft.  Musculoskeletal:        General: Normal range of motion.  Skin:    General: Skin is warm.  Neurological:     Mental Status: She is alert.     Comments: Mild cognitive impairment since CVA 07/2019 Unable to give DOB; place Husband in room He consented for procedure  Psychiatric:        Behavior: Behavior normal.    Imaging: No results found.  Labs:  CBC: Recent Labs    04/28/20 2033 04/28/20 2038 04/29/20 1556 04/30/20 0236 05/01/20 0139 05/02/20 0455  WBC 9.8  --   --  6.6 7.3 6.3  HGB 12.2   < >  11.5* 10.3* 10.8* 11.3*  HCT 39.7   < > 35.9* 32.5* 32.8* 36.0  PLT 236  --   --  183 214 189   < > = values in this interval not displayed.    COAGS: Recent Labs    08/31/19 0855 10/05/19 1641 10/06/19 0048 10/06/19 0740 10/06/19 0914 10/06/19 2118 10/07/19 0421 04/28/20 2033  INR 1.1 1.9*  --  1.8*  --   --   --  1.0  APTT  --   --    < > >200* 198* 104* 136* 33   < > = values in this interval not displayed.    BMP: Recent Labs    10/09/19 0810 10/10/19 0417 10/11/19 0858 10/18/19 0215 01/06/20 1119 04/29/20 0240 04/30/20 0236 05/01/20 0139 05/02/20 0455  NA 139 137 139 134*   < > 137 135 134* 134*  K 4.0 3.4* 3.7 3.7   < > 3.8 3.6 3.8 3.7  CL 109 104 102 98   < > 106 103 101 101  CO2 22 23 26 25    < > 22 25 25 26   GLUCOSE 98 91 120* 120*   < > 131* 115* 128* 125*  BUN <5* <5* 7* 10   < > 13 8 10 9   CALCIUM 8.4* 8.7* 9.0 9.1   < > 9.2 9.4 9.4 9.5  CREATININE 0.37* 0.48 0.52 0.49   < > 0.59 0.54 0.55 0.57  GFRNONAA >60 >60 >60 >60   < > >60 >60 >60 >60  GFRAA >60 >60 >60 >60  --   --   --   --   --    < > = values in this interval not displayed.    LIVER FUNCTION TESTS: Recent Labs    09/22/19 0323 10/05/19 1641 01/06/20 1119 04/28/20 2033  BILITOT 0.8 0.6 0.4 0.8  AST 17 21 18 20   ALT 13 16 12 14   ALKPHOS 79 90 83 83  PROT 5.2* 5.8* 6.3 6.5  ALBUMIN 2.3* 2.5* 3.5 3.8    TUMOR  MARKERS: No results for input(s): AFPTM, CEA, CA199, CHROMGRNA in the last 8760 hours.  Assessment and Plan:  IVC filter placed in IR 10/07/19--- Scheduled now for retrieval Pt and husband are aware of procedure benefits and risks- including but not limited to: infection; bleeding; vessel damage Agreeable to proceed Consent signed and in chart (with husband)  Thank you for this interesting consult.  I greatly enjoyed meeting Lisa [name] and look forward to participating in their care.  A copy of this report was sent to the requesting provider on this date.  Electronically Signed: Lavonia Drafts, PA-C 08/01/2020, 11:26 AM   I spent a total of    25 Minutes in face to face in clinical consultation, greater than 50% of which was counseling/coordinating care for IVC filter retrieval

## 2020-08-01 NOTE — Sedation Documentation (Signed)
Attempted to call report to short stay, no one available for report.

## 2020-08-01 NOTE — Procedures (Signed)
Interventional Radiology Procedure Note  Procedure:    US guided right IJ puncture, for IVC filter retrieval.  All tines accounted for on bard delali. .  Complications: None Recommendations:  - Ok to shower tomorrow - Do not submerge for 7 days - Routine care - 1 hr dc home  Signed,  Dulcy Fanny. Earleen Newport, DO

## 2020-08-04 DIAGNOSIS — R4701 Aphasia: Secondary | ICD-10-CM | POA: Diagnosis not present

## 2020-08-04 DIAGNOSIS — I951 Orthostatic hypotension: Secondary | ICD-10-CM | POA: Diagnosis not present

## 2020-08-04 DIAGNOSIS — E1149 Type 2 diabetes mellitus with other diabetic neurological complication: Secondary | ICD-10-CM | POA: Diagnosis not present

## 2020-08-04 DIAGNOSIS — I82403 Acute embolism and thrombosis of unspecified deep veins of lower extremity, bilateral: Secondary | ICD-10-CM | POA: Diagnosis not present

## 2020-08-04 DIAGNOSIS — I10 Essential (primary) hypertension: Secondary | ICD-10-CM | POA: Diagnosis not present

## 2020-08-04 DIAGNOSIS — R471 Dysarthria and anarthria: Secondary | ICD-10-CM | POA: Diagnosis not present

## 2020-08-05 ENCOUNTER — Other Ambulatory Visit: Payer: Self-pay

## 2020-08-05 NOTE — Patient Outreach (Signed)
[facility]) Care Management  08/05/2020  Konstantina Nachreiner Panjwani [birth year] [id number]   No Telephone outreach to patient as Dr Leonie Man noted score on 07/27/20. MRS= 2   [place] Care Management Assistant

## 2020-08-09 ENCOUNTER — Other Ambulatory Visit: Payer: Self-pay | Admitting: Family Medicine

## 2020-08-09 DIAGNOSIS — I82403 Acute embolism and thrombosis of unspecified deep veins of lower extremity, bilateral: Secondary | ICD-10-CM | POA: Diagnosis not present

## 2020-08-09 DIAGNOSIS — I951 Orthostatic hypotension: Secondary | ICD-10-CM | POA: Diagnosis not present

## 2020-08-09 DIAGNOSIS — E1149 Type 2 diabetes mellitus with other diabetic neurological complication: Secondary | ICD-10-CM | POA: Diagnosis not present

## 2020-08-09 DIAGNOSIS — I10 Essential (primary) hypertension: Secondary | ICD-10-CM | POA: Diagnosis not present

## 2020-08-09 DIAGNOSIS — R471 Dysarthria and anarthria: Secondary | ICD-10-CM | POA: Diagnosis not present

## 2020-08-09 DIAGNOSIS — R4701 Aphasia: Secondary | ICD-10-CM | POA: Diagnosis not present

## 2020-08-10 ENCOUNTER — Encounter: Payer: Medicare Other | Admitting: Family Medicine

## 2020-08-11 DIAGNOSIS — I10 Essential (primary) hypertension: Secondary | ICD-10-CM | POA: Diagnosis not present

## 2020-08-11 DIAGNOSIS — R471 Dysarthria and anarthria: Secondary | ICD-10-CM | POA: Diagnosis not present

## 2020-08-11 DIAGNOSIS — I951 Orthostatic hypotension: Secondary | ICD-10-CM | POA: Diagnosis not present

## 2020-08-11 DIAGNOSIS — E1149 Type 2 diabetes mellitus with other diabetic neurological complication: Secondary | ICD-10-CM | POA: Diagnosis not present

## 2020-08-11 DIAGNOSIS — R4701 Aphasia: Secondary | ICD-10-CM | POA: Diagnosis not present

## 2020-08-11 DIAGNOSIS — I82403 Acute embolism and thrombosis of unspecified deep veins of lower extremity, bilateral: Secondary | ICD-10-CM | POA: Diagnosis not present

## 2020-08-12 ENCOUNTER — Telehealth: Payer: Self-pay

## 2020-08-12 NOTE — Telephone Encounter (Signed)
..  Home Health verbal orders-caller/Agency: [facility]/ Shelly Bombard number: [phone]  Requesting OT/PT/Skilled nursing/Social Work/Speech: SPEECH   Reason:continue care   Frequency: 1 x 2week  2x 4weeks

## 2020-08-12 NOTE — Telephone Encounter (Signed)
Verbal orders given  

## 2020-08-16 DIAGNOSIS — C679 Malignant neoplasm of bladder, unspecified: Secondary | ICD-10-CM | POA: Diagnosis not present

## 2020-08-16 DIAGNOSIS — I10 Essential (primary) hypertension: Secondary | ICD-10-CM | POA: Diagnosis not present

## 2020-08-16 DIAGNOSIS — I69320 Aphasia following cerebral infarction: Secondary | ICD-10-CM | POA: Diagnosis not present

## 2020-08-16 DIAGNOSIS — E1149 Type 2 diabetes mellitus with other diabetic neurological complication: Secondary | ICD-10-CM | POA: Diagnosis not present

## 2020-08-16 DIAGNOSIS — I69322 Dysarthria following cerebral infarction: Secondary | ICD-10-CM | POA: Diagnosis not present

## 2020-08-16 DIAGNOSIS — I82403 Acute embolism and thrombosis of unspecified deep veins of lower extremity, bilateral: Secondary | ICD-10-CM | POA: Diagnosis not present

## 2020-08-18 DIAGNOSIS — I82403 Acute embolism and thrombosis of unspecified deep veins of lower extremity, bilateral: Secondary | ICD-10-CM | POA: Diagnosis not present

## 2020-08-18 DIAGNOSIS — I10 Essential (primary) hypertension: Secondary | ICD-10-CM | POA: Diagnosis not present

## 2020-08-18 DIAGNOSIS — I69320 Aphasia following cerebral infarction: Secondary | ICD-10-CM | POA: Diagnosis not present

## 2020-08-18 DIAGNOSIS — E1149 Type 2 diabetes mellitus with other diabetic neurological complication: Secondary | ICD-10-CM | POA: Diagnosis not present

## 2020-08-18 DIAGNOSIS — I69322 Dysarthria following cerebral infarction: Secondary | ICD-10-CM | POA: Diagnosis not present

## 2020-08-18 DIAGNOSIS — C679 Malignant neoplasm of bladder, unspecified: Secondary | ICD-10-CM | POA: Diagnosis not present

## 2020-08-23 DIAGNOSIS — I10 Essential (primary) hypertension: Secondary | ICD-10-CM | POA: Diagnosis not present

## 2020-08-23 DIAGNOSIS — I82403 Acute embolism and thrombosis of unspecified deep veins of lower extremity, bilateral: Secondary | ICD-10-CM | POA: Diagnosis not present

## 2020-08-23 DIAGNOSIS — E1149 Type 2 diabetes mellitus with other diabetic neurological complication: Secondary | ICD-10-CM | POA: Diagnosis not present

## 2020-08-23 DIAGNOSIS — I69322 Dysarthria following cerebral infarction: Secondary | ICD-10-CM | POA: Diagnosis not present

## 2020-08-23 DIAGNOSIS — I69320 Aphasia following cerebral infarction: Secondary | ICD-10-CM | POA: Diagnosis not present

## 2020-08-23 DIAGNOSIS — C679 Malignant neoplasm of bladder, unspecified: Secondary | ICD-10-CM | POA: Diagnosis not present

## 2020-08-24 ENCOUNTER — Telehealth: Payer: Self-pay | Admitting: Family Medicine

## 2020-08-24 DIAGNOSIS — H2512 Age-related nuclear cataract, left eye: Secondary | ICD-10-CM | POA: Diagnosis not present

## 2020-08-24 DIAGNOSIS — H2513 Age-related nuclear cataract, bilateral: Secondary | ICD-10-CM | POA: Diagnosis not present

## 2020-08-24 NOTE — Telephone Encounter (Signed)
Attempted to schedule AWV. Unable to LVM.  Will try at later time.    Called for patient to schedule Annual Wellness Visit.  Please schedule with Nurse Health Advisor Charlott Rakes, RN at [facility].

## 2020-08-30 DIAGNOSIS — I10 Essential (primary) hypertension: Secondary | ICD-10-CM | POA: Diagnosis not present

## 2020-08-30 DIAGNOSIS — E1149 Type 2 diabetes mellitus with other diabetic neurological complication: Secondary | ICD-10-CM | POA: Diagnosis not present

## 2020-08-30 DIAGNOSIS — I82403 Acute embolism and thrombosis of unspecified deep veins of lower extremity, bilateral: Secondary | ICD-10-CM | POA: Diagnosis not present

## 2020-08-30 DIAGNOSIS — C679 Malignant neoplasm of bladder, unspecified: Secondary | ICD-10-CM | POA: Diagnosis not present

## 2020-08-30 DIAGNOSIS — I69320 Aphasia following cerebral infarction: Secondary | ICD-10-CM | POA: Diagnosis not present

## 2020-08-30 DIAGNOSIS — I69322 Dysarthria following cerebral infarction: Secondary | ICD-10-CM | POA: Diagnosis not present

## 2020-09-02 DIAGNOSIS — I82403 Acute embolism and thrombosis of unspecified deep veins of lower extremity, bilateral: Secondary | ICD-10-CM | POA: Diagnosis not present

## 2020-09-02 DIAGNOSIS — E1149 Type 2 diabetes mellitus with other diabetic neurological complication: Secondary | ICD-10-CM | POA: Diagnosis not present

## 2020-09-02 DIAGNOSIS — C679 Malignant neoplasm of bladder, unspecified: Secondary | ICD-10-CM | POA: Diagnosis not present

## 2020-09-02 DIAGNOSIS — I69322 Dysarthria following cerebral infarction: Secondary | ICD-10-CM | POA: Diagnosis not present

## 2020-09-02 DIAGNOSIS — I69320 Aphasia following cerebral infarction: Secondary | ICD-10-CM | POA: Diagnosis not present

## 2020-09-02 DIAGNOSIS — I10 Essential (primary) hypertension: Secondary | ICD-10-CM | POA: Diagnosis not present

## 2020-09-07 DIAGNOSIS — I69320 Aphasia following cerebral infarction: Secondary | ICD-10-CM | POA: Diagnosis not present

## 2020-09-07 DIAGNOSIS — I82403 Acute embolism and thrombosis of unspecified deep veins of lower extremity, bilateral: Secondary | ICD-10-CM | POA: Diagnosis not present

## 2020-09-07 DIAGNOSIS — I69322 Dysarthria following cerebral infarction: Secondary | ICD-10-CM | POA: Diagnosis not present

## 2020-09-07 DIAGNOSIS — E1149 Type 2 diabetes mellitus with other diabetic neurological complication: Secondary | ICD-10-CM | POA: Diagnosis not present

## 2020-09-07 DIAGNOSIS — I10 Essential (primary) hypertension: Secondary | ICD-10-CM | POA: Diagnosis not present

## 2020-09-07 DIAGNOSIS — C679 Malignant neoplasm of bladder, unspecified: Secondary | ICD-10-CM | POA: Diagnosis not present

## 2020-09-08 ENCOUNTER — Other Ambulatory Visit: Payer: Self-pay | Admitting: Family Medicine

## 2020-09-20 ENCOUNTER — Other Ambulatory Visit: Payer: Self-pay | Admitting: *Deleted

## 2020-09-20 ENCOUNTER — Telehealth: Payer: Self-pay

## 2020-09-20 MED ORDER — ATORVASTATIN CALCIUM 20 MG PO TABS: 20.0000 mg | ORAL_TABLET | Freq: Every day | ORAL | 1 refills | Status: DC

## 2020-09-20 MED ORDER — AMLODIPINE BESYLATE 5 MG PO TABS: 5.0000 mg | ORAL_TABLET | Freq: Every day | ORAL | 2 refills | Status: DC

## 2020-09-20 MED ORDER — LOSARTAN POTASSIUM 50 MG PO TABS: 25.0000 mg | ORAL_TABLET | Freq: Every morning | ORAL | 3 refills | Status: DC

## 2020-09-20 NOTE — Telephone Encounter (Signed)
New Rx send to [place]

## 2020-09-20 NOTE — Telephone Encounter (Signed)
Patient states that she is moving pharmacies ( has been Updated ) but would like to change all her Rxs to 90 day supply instead of 60 day supply in the future   . Encourage patient to contact the pharmacy for refills or they can request refills through [place]:  Please schedule appointment if longer than 1 year  NEXT APPOINTMENT DATE:  MEDICATION: amLODipine (NORVASC) 5 MG tablet  atorvastatin (LIPITOR) 20 MG tablet losartan (COZAAR) 50 MG tablet   Is the patient out of medication? Not yet   [place] [address], [place] - [id number]

## 2020-09-27 DIAGNOSIS — C678 Malignant neoplasm of overlapping sites of bladder: Secondary | ICD-10-CM | POA: Diagnosis not present

## 2020-10-06 ENCOUNTER — Telehealth: Payer: Self-pay

## 2020-10-06 NOTE — Telephone Encounter (Signed)
Spoke with patient husband. Informed Rx was send on 09/20/2020 Verbalized understanding

## 2020-10-06 NOTE — Telephone Encounter (Signed)
,   Encourage patient to contact the pharmacy for refills or they can request refills through [place]:  Please schedule appointment if longer than 1 year  NEXT APPOINTMENT DATE:  MEDICATION:amLODipine (NORVASC) 5 MG tablet  atorvastatin (LIPITOR) 20 MG tablet  losartan (COZAAR) 50 MG tablet  Is the patient out of medication?   [place], [place] [id number]  Let patient know to contact pharmacy at the end of the day to make sure medication is ready.  Please notify patient to allow 48-72 hours to process  CLINICAL FILLS OUT ALL BELOW:   LAST REFILL:  QTY:  REFILL DATE:    OTHER COMMENTS:    Okay for refill?  Please advise

## 2020-10-11 ENCOUNTER — Telehealth: Payer: Self-pay

## 2020-10-11 NOTE — Telephone Encounter (Signed)
Type of forms received:Note asking for list of medication   Routed to: [employer] received by :  Jinny Blossom   Individual made aware of 3-5 business day turn around (Y/N):YES would like it sooner    Form completed and patient made aware of charges(Y/N):   Faxed to : [place] [phone]  Form location:  Placed in parkers box

## 2020-10-11 NOTE — Telephone Encounter (Signed)
Noted  

## 2020-10-25 ENCOUNTER — Ambulatory Visit: Payer: Medicare Other | Admitting: Adult Health

## 2020-11-29 ENCOUNTER — Ambulatory Visit (INDEPENDENT_AMBULATORY_CARE_PROVIDER_SITE_OTHER): Payer: Medicare Other | Admitting: Family Medicine

## 2020-11-29 ENCOUNTER — Encounter: Payer: Self-pay | Admitting: Family Medicine

## 2020-11-29 ENCOUNTER — Other Ambulatory Visit: Payer: Self-pay

## 2020-11-29 VITALS — BP 126/72 | HR 84 | Temp 97.2°F | Ht 66.0 in | Wt 166.4 lb

## 2020-11-29 DIAGNOSIS — E119 Type 2 diabetes mellitus without complications: Secondary | ICD-10-CM | POA: Diagnosis not present

## 2020-11-29 DIAGNOSIS — E1159 Type 2 diabetes mellitus with other circulatory complications: Secondary | ICD-10-CM

## 2020-11-29 DIAGNOSIS — E1169 Type 2 diabetes mellitus with other specified complication: Secondary | ICD-10-CM

## 2020-11-29 DIAGNOSIS — I152 Hypertension secondary to endocrine disorders: Secondary | ICD-10-CM | POA: Diagnosis not present

## 2020-11-29 DIAGNOSIS — C679 Malignant neoplasm of bladder, unspecified: Secondary | ICD-10-CM | POA: Diagnosis not present

## 2020-11-29 DIAGNOSIS — E785 Hyperlipidemia, unspecified: Secondary | ICD-10-CM | POA: Diagnosis not present

## 2020-11-29 LAB — POCT GLYCOSYLATED HEMOGLOBIN (HGB A1C): Hemoglobin A1C: 6.2 % — AB (ref 4.0–5.6)

## 2020-11-29 MED ORDER — ATORVASTATIN CALCIUM 20 MG PO TABS: 20.0000 mg | ORAL_TABLET | Freq: Every day | ORAL | 3 refills | Status: DC

## 2020-11-29 MED ORDER — LOSARTAN POTASSIUM 50 MG PO TABS: 25.0000 mg | ORAL_TABLET | Freq: Every morning | ORAL | 3 refills | Status: DC

## 2020-11-29 MED ORDER — AMLODIPINE BESYLATE 5 MG PO TABS: 5.0000 mg | ORAL_TABLET | Freq: Every day | ORAL | 3 refills | Status: DC

## 2020-11-29 NOTE — Patient Instructions (Signed)
It was very nice to see you today!  Your A1c looks great!  No changes today.  Will come back in 6 months for your annual checkup with labs.  Please come back to see me sooner if needed.  Take care, Dr Jerline Pain  PLEASE NOTE:  If you had any lab tests please let us know if you have not heard back within a few days. You may see your results on mychart before we have a chance to review them but we will give you a call once they are reviewed by [place]. If we ordered any referrals today, please let us know if you have not heard from their office within the next week.   Please try these tips to maintain a healthy lifestyle:  Eat at least 3 REAL meals and 1-2 snacks per day.  Aim for no more than 5 hours between eating.  If you eat breakfast, please do so within one hour of getting up.   Each meal should contain half fruits/vegetables, one quarter protein, and one quarter carbs (no bigger than a computer mouse)  Cut down on sweet beverages. This includes juice, soda, and sweet tea.   Drink at least 1 glass of water with each meal and aim for at least 8 glasses per day  Exercise at least 150 minutes every week.

## 2020-11-29 NOTE — Progress Notes (Signed)
   Lisa [name] is a 70 y.o. female who presents today for an office visit.  Assessment/Plan:  Chronic Problems Addressed Today: Dyslipidemia associated with type 2 diabetes mellitus (HCC) Check lipids next blood draw.  Continue Lipitor 20 mg daily.  Diabetes mellitus without complication (HCC) W5Y 6.2 off medications. Check again in 6 months.   Hypertension associated with diabetes ([place]) At goal on amlodipine 5mg  daily and losartan 25mg  daily.   Primary papillary carcinoma of bladder [facility]) Recently had follow-up visit with urology.  No signs of recurrence.  Flu shot declined.     Subjective:  HPI:  She is here to follow up on diabetes She was last seen in the office on 05/19/2020.   She is accompanied by her husband. They recently moved.  Her A1c is 6.2 in the office today. She is not taking any medication for diabetes.  She is on Amlodipine 5 mg daily  and Losartan 25 mg daily for hypertension. She is tolerating her medication well with no side effects.  She have a hx of stroke. She is currently on Lipitor 20 mg.  She follows up with vascular surgeon Dr. Donzetta Matters for this.           Objective:  Physical Exam: BP 126/72   Pulse 84   Temp (!) 97.2 F (36.2 C)   Ht 5\' 6"  (1.676 m)   Wt 166 lb 6.1 oz (75.5 kg)   SpO2 98%   BMI 26.85 kg/m   Gen: No acute distress, resting comfortably CV: Regular rate and rhythm with no murmurs appreciated Pulm: Normal work of breathing, clear to auscultation bilaterally with no crackles, wheezes, or rhonchi Neuro: Grossly normal, moves all extremities Psych: Normal affect and thought content       I,Savera Zaman,acting as a scribe for Dimas Chyle, MD.,have documented all relevant documentation on the behalf of Dimas Chyle, MD,as directed by  Dimas Chyle, MD while in the presence of Dimas Chyle, MD.   I, Dimas Chyle, MD, have reviewed all documentation for this visit. The documentation on 11/29/20 for the exam, diagnosis,  procedures, and orders are all accurate and complete.  Algis Greenhouse. Jerline Pain, MD 11/29/2020 10:38 AM

## 2020-11-29 NOTE — Assessment & Plan Note (Signed)
At goal on amlodipine 5mg  daily and losartan 25mg  daily.

## 2020-11-29 NOTE — Assessment & Plan Note (Signed)
Recently had follow-up visit with urology.  No signs of recurrence.

## 2020-11-29 NOTE — Assessment & Plan Note (Signed)
Check lipids next blood draw.  Continue Lipitor 20 mg daily.

## 2020-11-29 NOTE — Assessment & Plan Note (Signed)
A1c 6.2 off medications. Check again in 6 months.

## 2020-12-29 DIAGNOSIS — C678 Malignant neoplasm of overlapping sites of bladder: Secondary | ICD-10-CM | POA: Diagnosis not present

## 2021-01-04 ENCOUNTER — Other Ambulatory Visit: Payer: Self-pay

## 2021-01-04 DIAGNOSIS — I6521 Occlusion and stenosis of right carotid artery: Secondary | ICD-10-CM

## 2021-01-04 DIAGNOSIS — I6522 Occlusion and stenosis of left carotid artery: Secondary | ICD-10-CM

## 2021-01-12 ENCOUNTER — Ambulatory Visit (HOSPITAL_COMMUNITY)
Admission: RE | Admit: 2021-01-12 | Discharge: 2021-01-12 | Disposition: A | Discharge status: Home / Self Care | Payer: Medicare Other | Source: Ambulatory Visit | Attending: Vascular Surgery | Admitting: Vascular Surgery

## 2021-01-12 ENCOUNTER — Ambulatory Visit: Payer: Medicare Other | Admitting: Physician Assistant

## 2021-01-12 ENCOUNTER — Other Ambulatory Visit: Payer: Self-pay

## 2021-01-12 VITALS — BP 149/86 | HR 71 | Temp 98.0°F | Resp 20 | Ht 66.0 in | Wt 167.8 lb

## 2021-01-12 DIAGNOSIS — I6522 Occlusion and stenosis of left carotid artery: Secondary | ICD-10-CM | POA: Diagnosis not present

## 2021-01-12 DIAGNOSIS — I6523 Occlusion and stenosis of bilateral carotid arteries: Secondary | ICD-10-CM | POA: Diagnosis not present

## 2021-01-12 DIAGNOSIS — I6521 Occlusion and stenosis of right carotid artery: Secondary | ICD-10-CM | POA: Diagnosis not present

## 2021-01-12 NOTE — Progress Notes (Signed)
History of Present Illness:  Patient is a 70 y.o. year old female who presents for evaluation of carotid stenosis.  She was initially seen at [facility] for symptomatic CVA with carotid stenosis on 08/20/19 by Dr. Donzetta Matters.  He performed a left TCAR.     The patient denies symptoms of TIA, amaurosis, or stroke.  She and her husband walk daily for exercise. She takes ASA and Statin daily for medical management.      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    Past Surgical History:  Procedure Laterality Date   CYSTOSCOPY WITH FULGERATION N/A 09/01/2019   Procedure: CYSTOSCOPY CLOT EVACUATION WITH POSSIBLE FULGERATION;  Surgeon: Robley Fries, MD;  Location: [place];  Service: Urology;  Laterality: N/A;   CYSTOSCOPY WITH FULGERATION N/A 09/28/2019   Procedure: CYSTOSCOPY WITH FULGERATION AND CLOT EVACUATION;  Surgeon: Robley Fries, MD;  Location: [place];  Service: Urology;  Laterality: N/A;   IR IVC FILTER PLMT / S&I /IMG GUID/MOD SED  10/07/2019   IR IVC FILTER RETRIEVAL / S&I /IMG GUID/MOD SED  08/01/2020   IR RADIOLOGIST EVAL & MGMT  03/22/2020   TONSILLECTOMY     TRANSCAROTID ARTERY REVASCULARIZATION  Left 08/20/2019   Procedure: LEFT TRANSCAROTID ARTERY REVASCULARIZATION;  Surgeon: Waynetta Sandy, MD;  Location: [place];  Service: Vascular;  Laterality: Left;   TRANSURETHRAL RESECTION OF BLADDER TUMOR WITH MITOMYCIN-C N/A 09/22/2019   Procedure: TRANSURETHRAL RESECTION OF BLADDER TUMOR WITH INTRAVESICAL GEMCITABINE;  Surgeon: Robley Fries, MD;  Location: [facility];  Service: Urology;  Laterality: N/A;  35 MINS     Social History Social History   Tobacco Use   Smoking status: Former    Packs/day: 0.50    Types: Cigarettes    Quit date: 07/28/2019    Years since quitting: 1.4   Smokeless  tobacco: Never  Vaping Use   Vaping Use: Never used  Substance Use Topics   Alcohol use: Yes    Comment: occ   Drug use: Never    Family History Family History  Problem Relation Age of Onset   Hypertension Mother    Lung cancer Father     Allergies  No Known Allergies   Current Outpatient Medications  Medication Sig Dispense Refill   amLODipine (NORVASC) 5 MG tablet Take 1 tablet (5 mg total) by mouth daily. 90 tablet 3   aspirin EC 81 MG tablet Take 81 mg by mouth daily. Swallow whole.     atorvastatin (LIPITOR) 20 MG tablet Take 1 tablet (20 mg total) by mouth daily. 90 tablet 3   Biotin w/ Vitamins C & E (HAIR/SKIN/NAILS PO) Take 1 tablet by mouth daily.     docusate sodium (COLACE) 100 MG capsule Take 1 capsule (100 mg total) by mouth daily. 90 capsule 1   losartan (COZAAR) 50 MG tablet Take 0.5 tablets (25 mg total) by mouth every morning. 90 tablet 3   PROLENSA 0.07 % SOLN Place 1 drop into the right eye at bedtime.     No current facility-administered medications for this visit.    ROS:   General:  No weight loss, Fever, chills  HEENT: No recent headaches, no nasal bleeding, no visual changes, no sore throat  Neurologic: No dizziness, blackouts, seizures. No recent symptoms of stroke or mini- stroke. No recent episodes of slurred speech, or temporary blindness.  Cardiac: No recent episodes of chest pain/pressure, no shortness of breath at rest.  No shortness of breath with exertion.  Denies history of atrial fibrillation or irregular heartbeat  Vascular: No history of rest pain in feet.  No history of claudication.  No history of non-healing ulcer, No history of DVT   Pulmonary: No home oxygen, no productive cough, no hemoptysis,  No asthma or wheezing  Musculoskeletal:  [ ]  Arthritis, [ ]  Low back pain,  [ ]  Joint pain  Hematologic:No history of hypercoagulable state.  No history of easy bleeding.  No history of anemia  Gastrointestinal: No hematochezia or  melena,  No gastroesophageal reflux, no trouble swallowing  Urinary: [ ]  chronic Kidney disease, [ ]  on HD - [ ]  MWF or [ ]  TTHS, [ ]  Burning with urination, [ ]  Frequent urination, [ ]  Difficulty urinating;   Skin: No rashes  Psychological: No history of anxiety,  No history of depression   Physical Examination  Vitals:   01/12/21 0901 01/12/21 0903  BP: (!) 151/78 (!) 149/86  Pulse: 71   Resp: 20   Temp: 98 F (36.7 C)   TempSrc: Temporal   SpO2: 100%   Weight: 167 lb 12.8 oz (76.1 kg)   Height: 5\' 6"  (1.676 m)     Body mass index is 27.08 kg/m.  General:  Alert and oriented, no acute distress HEENT: Normal Neck: No bruit or JVD Pulmonary: Clear to auscultation bilaterally Cardiac: Regular Rate and Rhythm without murmur Gastrointestinal: Soft, non-tender, non-distended, no mass, no scars Skin: No rash Extremity Pulses:  2+ radial, Musculoskeletal: No deformity or edema  Neurologic: Upper and lower extremity motor 5/5 and symmetric  DATA:      Right Carotid Findings:  +----------+--------+--------+--------+----------------------+--------+            PSV cm/sEDV cm/sStenosisPlaque Description    Comments  +----------+--------+--------+--------+----------------------+--------+  CCA Prox  76      11      <50%    diffuse                         +----------+--------+--------+--------+----------------------+--------+  CCA Mid   65      15                                              +----------+--------+--------+--------+----------------------+--------+  CCA Distal48      11                                              +----------+--------+--------+--------+----------------------+--------+  ICA Prox  69      19      1-39%   calcific and irregular          +----------+--------+--------+--------+----------------------+--------+  ICA Mid   67      15                                               +----------+--------+--------+--------+----------------------+--------+  ICA Distal67  14                                              +----------+--------+--------+--------+----------------------+--------+  ECA       159     21      >50%                                    +----------+--------+--------+--------+----------------------+--------+   +----------+--------+-------+----------------+-------------------+            PSV cm/sEDV cmsDescribe        Arm Pressure (mmHG)  +----------+--------+-------+----------------+-------------------+  [id number]     0      Multiphasic, WNL                     +----------+--------+-------+----------------+-------------------+   +---------+--------+--+--------+-+---------+  VertebralPSV cm/s25EDV cm/s6Antegrade  +---------+--------+--+--------+-+---------+       Left Carotid Findings:  +----------+--------+--------+--------+------------------+-------------+            PSV cm/sEDV cm/sStenosisPlaque DescriptionComments       +----------+--------+--------+--------+------------------+-------------+  CCA Prox  46      7                                                +----------+--------+--------+--------+------------------+-------------+  CCA Mid   49      13      <50%    smooth                           +----------+--------+--------+--------+------------------+-------------+  CCA Distal48      10                                stent          +----------+--------+--------+--------+------------------+-------------+  ICA Prox  45      11      1-39%                     stent          +----------+--------+--------+--------+------------------+-------------+  ICA Mid   76      17                                               +----------+--------+--------+--------+------------------+-------------+  ICA Distal70      16                                                +----------+--------+--------+--------+------------------+-------------+  ECA       439     49      >50%                      through stent  +----------+--------+--------+--------+------------------+-------------+   +----------+--------+--------+----------------+-------------------+            PSV cm/sEDV cm/sDescribe  Arm Pressure (mmHG)  +----------+--------+--------+----------------+-------------------+  Subclavian122     0       Multiphasic, WNL                     +----------+--------+--------+----------------+-------------------+   +---------+--------+--+--------+--+---------+  VertebralPSV cm/s68EDV cm/s10Antegrade  +---------+--------+--+--------+--+---------+       Left Stent(s):  +---------------------+--------+--------+--------+--------+--------+  Common carotid to ICAPSV cm/sEDV cm/sStenosisWaveformComments  +---------------------+--------+--------+--------+--------+--------+  Prox to Stent        42      10                                +---------------------+--------+--------+--------+--------+--------+  Proximal Stent       34      10                                +---------------------+--------+--------+--------+--------+--------+  Mid Stent            31      9                                 +---------------------+--------+--------+--------+--------+--------+  Distal Stent         47      12                                +---------------------+--------+--------+--------+--------+--------+  Distal to Stent      53      15                                +---------------------+--------+--------+--------+--------+--------+       ASSESSMENT:  S/P left TCAR for symptomatic carotid stenosis s/p left CVA She has < 39% stenosis B ICA and in stent.  She denise residual CVA symptoms and no new symptoms of CVA/TIA.  PLAN: Stay active, cont. ASA and Statin daily.  F/U in 1 year for repeat carotid  duplex.  Is she develops new symptoms of CVA/TIA she will call 911.   Roxy Horseman PA-C [facility] Office: [phone]  MD in office [place]

## 2021-01-16 ENCOUNTER — Telehealth: Payer: Self-pay | Admitting: Neurology

## 2021-01-16 NOTE — Telephone Encounter (Signed)
Office closing early 12/15, LVM and sent mychart message asking pt to call back and r/s.

## 2021-01-17 NOTE — Telephone Encounter (Signed)
Pt's husband called back to get wife rescheduled. Dr. Leonie Man is booked out into April. Are we blocking off days for the people we are having to cancel?

## 2021-01-26 ENCOUNTER — Ambulatory Visit: Payer: Medicare Other | Admitting: Neurology

## 2021-02-21 ENCOUNTER — Other Ambulatory Visit: Payer: Self-pay

## 2021-02-21 ENCOUNTER — Ambulatory Visit: Payer: Medicare Other | Admitting: Neurology

## 2021-02-21 ENCOUNTER — Encounter: Payer: Self-pay | Admitting: Neurology

## 2021-02-21 VITALS — BP 156/89 | HR 78 | Ht 66.0 in | Wt 170.4 lb

## 2021-02-21 DIAGNOSIS — I69359 Hemiplegia and hemiparesis following cerebral infarction affecting unspecified side: Secondary | ICD-10-CM

## 2021-02-21 DIAGNOSIS — Z95828 Presence of other vascular implants and grafts: Secondary | ICD-10-CM

## 2021-02-21 NOTE — Patient Instructions (Signed)
I had a long d/w patient and her husband about her remote stroke, carotid stent, risk for recurrent stroke/TIAs, personally independently reviewed imaging studies and stroke evaluation results and answered questions.Continue aspirin 81 mg daily  for secondary stroke prevention and maintain strict control of hypertension with blood pressure goal below 130/90, diabetes with hemoglobin A1c goal below 6.5% and lipids with LDL cholesterol goal below 70 mg/dL. I also advised the patient to eat a healthy diet with plenty of whole grains, cereals, fruits and vegetables, exercise regularly and maintain ideal body weight.  Continue follow-up with vascular surgery for carotid stent surveillance.  Followup in the future with me in 1 year or call earlier if necessary.

## 2021-02-21 NOTE — Progress Notes (Signed)
[facility] [address] [place]. [place] [address] [phone]       OFFICE FOLLOW UPVISIT NOTE  Lisa. Lisa [name] Date of Birth:  Mar 21, 1950 Medical Record Number:  [id number]   Referring MD: Rosalin Hawking  Reason for Referral: Stroke HPI: Initial visit 04/18/2020 :Lisa [name] is a pleasant 71 year Caucasian lady seen today for initial office consultation for stroke.  She is accompanied by her husband.  History is obtained from them and review of electronic medical records and I personally reviewed pertinent imaging films in PACS.  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  She presented initially on 08/12/2019 as a code stroke with confusion for 5 days prior to admission.  MRI scan showed left MCA subacute infarct and carotid ultrasound showed 80 to 99% left ICA stenosis.  CT angiogram showed near occlusion of the left carotid at the origin of the internal carotid.  2D echo showed linear mobile density on the aortic valve and TEE was suggested but was recommended to be done as an outpatient.  LDL cholesterol 158 mg percent.  Hemoglobin A1c was 8.2.  Urine drug screen was positive for cannabis.  She was started on aspirin and Plavix for 3 months given his intracranial stenosis with CT angiogram showing slow flow in the left M2, distal left MCA vessels and left M2 branch occlusion and left M1 moderate stenosis.  She underwent left carotid TCAR procedure with stenting on 08/20/2019 by vascular surgery.  On 09/18/2019 with sudden onset of right facial droop and mild expressive aphasia and MRI scan showed small acute infarct in the posterior medial left thalamus.  NIH stroke scale was 5 but most of the deficit appeared to be old from previous stroke.  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  Lower extremity venous Doppler showed bilateral DVTs.  Patient was initially started on IV heparin but was stopped for the bladder cancer surgery and plan was to switch to Eliquis on discharge.  Patient however had IVC filter placed.  She now states that she has plans to have the filter removed and in a few weeks.  She is currently on aspirin alone.  She has had no further hematuria.  She has had no recurrent stroke or TIA symptoms.  Her blood pressure is well controlled today it is 138/82.  She is tolerating Lipitor well without any side effects.  She still has some intermittent speech difficulties and some numbness in the right hand off and on.  She is finished home therapies.  Her sister had noted that she has some cognitive issues with decreased judgment and depth perception but the husband feels that these are improving.  She does have easy bruising. Update 07/27/2020: She returns for follow-up after last visit with me 3 months ago.  On 04/28/2020 she was hospitalized to [facility] with episode of brief loss of consciousness.  She was found fallen in the bedroom facedown and unconscious.  There is no tonic-clonic activity incontinence or tongue bite noted.  When she arrived in the ER she was noted to have some right-sided weakness and thought to have a stroke and was given IV tPA.  CT head is negative for LVO and CT angiogram showed 50% right ICA stenosis.  EEG showed EF of  6065% LDL cholesterol 42 mg percent hemoglobin A1c 6.0.  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  There is a tiny amount of perimesencephalic cistern subarachnoid hemorrhage as well.  There is old left MCA infarct.  Aspirin was discontinued.  Patient was found to have orthostatic hypotension and in retrospect episode of loss of consciousness was felt to be a syncopal event.   Patient was advised to do orthostatic tolerance exercises, wear abdominal binder and stay well-hydrated.  She states she is done well since discharge.  Blood pressure has been under good control today it is 134/73.  She has restarted aspirin.  She did follow-up with vascular surgeon Dr. Donzetta Matters who did a follow-up carotid ultrasound and seemed satisfied without significant restenosis in the stent.  She plans to have IVC filter removed next week.  She states she is back to baseline and has no complaints today. Update 02/21/2021: She returns for follow-up after last visit 6 months ago.  She continues to do well and has not had any recurrent stroke or TIA symptoms.  She remains on aspirin 81 mg she is tolerating well without bruising or bleeding or other side effects.  Her blood pressure is well controlled today it is slightly elevated in office at 156/89.  She remains on Lipitor which is tolerating well without muscle aches and pains.  Last lipid profile on 04/29/2020 was acceptable at 42 mg percent.  Hemoglobin A1c on 11/29/2020 was 6.2.  She did have follow-up carotid ultrasound done in Dr. Tod Persia office on 01/12/2021 which showed no significant bilateral carotid restenosis.  She had IVC filter removed on 08/01/2020.  She continues to have mild short-term memory difficulties with these unchanged and nonprogressive.  She does do a lot of puzzles every day.  She has no new complaints. ROS:   14 system review of systems is positive for numbness, memory difficulties, weakness, speech and word finding difficulty, word hesitancy all other systems negative  PMH:  Past Medical History:  Diagnosis Date   Anemia    Cancer ([place])    Carotid arterial disease ([place]) 07/2019   Diabetes ([place])    HTN (hypertension)    Hyperlipidemia    Stroke ([place])    acute/subacute left MCA infarct 08/12/19    Social History:  Social History   Socioeconomic History   Marital status: Married    Spouse name: Not on file   Number of  children: Not on file   Years of education: Not on file   Highest education level: Not on file  Occupational History   Not on file  Tobacco Use   Smoking status: Former    Packs/day: 0.50    Types: Cigarettes    Quit date: 07/28/2019    Years since quitting: 1.5   Smokeless tobacco: Never  Vaping Use   Vaping Use: Never used  Substance and Sexual Activity   Alcohol use: Yes    Comment: occ   Drug use: Never   Sexual activity: Yes  Other Topics Concern   Not on file  Social History Narrative   Lives Independent Living with husband, [place] in [place].    Left Handed   Drinks no caffeine   Social Determinants of Health   Financial Resource Strain: Not on file  Food Insecurity: Not on file  Transportation Needs: Not on file  Physical Activity: Not on file  Stress: Not  on file  Social Connections: Not on file  Intimate Partner Violence: Not on file    Medications:   Current Outpatient Medications on File Prior to Visit  Medication Sig Dispense Refill   amLODipine (NORVASC) 5 MG tablet Take 1 tablet (5 mg total) by mouth daily. 90 tablet 3   aspirin EC 81 MG tablet Take 81 mg by mouth daily. Swallow whole.     atorvastatin (LIPITOR) 20 MG tablet Take 1 tablet (20 mg total) by mouth daily. 90 tablet 3   losartan (COZAAR) 50 MG tablet Take 0.5 tablets (25 mg total) by mouth every morning. 90 tablet 3   No current facility-administered medications on file prior to visit.    Allergies:  No Known Allergies  Physical Exam General: Mildly obese middle-aged Caucasian lady, seated, in no evident distress Head: head normocephalic and atraumatic.   Neck: supple with no carotid or supraclavicular bruits Cardiovascular: regular rate and rhythm, no murmurs Musculoskeletal: no deformity Skin:  no rash/petichiae Vascular:  Normal pulses all extremities  Neurologic Exam Mental Status: Awake and fully alert. Oriented to place and time. Recent and remote memory intact. Attention  span, concentration and fund of knowledge appropriate. Mood and affect appropriate.  Speech mostly fluent with occasional hesitancy and word finding difficulty.  No paraphasic errors.  Good comprehension. Cranial Nerves: Fundoscopic exam not done. Pupils equal, briskly reactive to light. Extraocular movements full without nystagmus. Visual fields show partial right homonymous hemianopsia  to confrontation. Hearing intact. Facial sensation intact.  Mild right nasolabial fold asymmetry., tongue, palate moves normally and symmetrically.  Motor: Normal bulk and tone. Normal strength in all tested extremity muscles except mild weakness of right grip and intrinsic hand muscles.  Diminished fine finger movements on the right.  Orbits left over right upper extremity.. Sensory.: intact to touch , pinprick , position and vibratory sensation.  Subjective paresthesias in the right hand but no objective sensory loss. Coordination: Rapid alternating movements normal in all extremities. Finger-to-nose and heel-to-shin performed accurately bilaterally. Gait and Station: Arises from chair without difficulty. Stance is normal. Gait demonstrates normal stride length and balance . Able to heel, toe and tandem walk without difficulty.  Reflexes: 1+ and symmetric. Toes downgoing.   NIHSS 2 Modified Rankin  2  ASSESSMENT: 71 year old Caucasian lady with symptomatic left hemispheric MCA branch infarct in June 2021 from severe proximal carotid stenosis as well as recurrent small stroke in August 2021 with vascular risk factors of carotid stenosis, hypertension, hyperlipidemia, diabetes and mild obesity.  She may have mild cognitive impairment post stroke.  Recent episode of brief loss of consciousness on 04/28/2020 likely a syncopal event due to orthostatic hypotension followed by transient right hemiparesis which is likely exacerbation of old deficits.  She was given tPA but MRI was negative for stroke and there is small 1 cm  left temporal lobe hemorrhage is post tPA complication from which she is doing well     PLAN: I had a long d/w patient and her husband about her remote stroke, carotid stent, risk for recurrent stroke/TIAs, personally independently reviewed imaging studies and stroke evaluation results and answered questions.Continue aspirin 81 mg daily  for secondary stroke prevention and maintain strict control of hypertension with blood pressure goal below 130/90, diabetes with hemoglobin A1c goal below 6.5% and lipids with LDL cholesterol goal below 70 mg/dL. I also advised the patient to eat a healthy diet with plenty of whole grains, cereals, fruits and vegetables, exercise regularly and maintain ideal body  weight.  Continue follow-up with vascular surgery for carotid stent surveillance.  Followup in the future with me in 1 year or call earlier if necessary.Greater than 50% time during this 35-minute   visit was spent on counseling and coordination of care about her strokes and symptomatic carotid stenosis and discussion about stroke risk and prevention and treatment and answering questions. Antony Contras, MD  [facility] [address] [place], [place] [address]  Phone [phone] Fax [phone] Note: This document was prepared with digital dictation and possible smart phrase technology. Any transcriptional errors that result from this process are unintentional.

## 2021-03-08 ENCOUNTER — Telehealth: Payer: Self-pay | Admitting: Family Medicine

## 2021-03-08 NOTE — Telephone Encounter (Signed)
Copied from [place] [id number]. Topic: Medicare AWV >> Mar 08, 2021 10:34 AM Harris-Coley, Hannah Beat wrote: Reason for CRM: Left message for patient to schedule Annual Wellness Visit.  Please schedule with Nurse Health Advisor Charlott Rakes, RN at [facility].  Please call [phone] ask for [facility]

## 2021-03-31 DIAGNOSIS — C678 Malignant neoplasm of overlapping sites of bladder: Secondary | ICD-10-CM | POA: Diagnosis not present

## 2021-04-04 ENCOUNTER — Telehealth: Payer: Self-pay | Admitting: Family Medicine

## 2021-04-04 NOTE — Chronic Care Management (AMB) (Signed)
°  Chronic Care Management   Outreach Note  04/04/2021 Name: Lisa [name] MRN: [id number] DOB: January 22, 1951  Referred by: Vivi Barrack, MD Reason for referral : No chief complaint on file.   An unsuccessful telephone outreach was attempted today. The patient was referred to the pharmacist for assistance with care management and care coordination.   Follow Up Plan:   Tatjana Dellinger Upstream Scheduler

## 2021-04-10 ENCOUNTER — Telehealth: Payer: Self-pay | Admitting: Family Medicine

## 2021-04-10 NOTE — Chronic Care Management (AMB) (Signed)
°  Chronic Care Management   Outreach Note  04/10/2021 Name: Lisa [name] MRN: [id number] DOB: 1950/02/22  Referred by: Vivi Barrack, MD Reason for referral : No chief complaint on file.   A second unsuccessful telephone outreach was attempted today. The patient was referred to pharmacist for assistance with care management and care coordination.  Follow Up Plan:   Tatjana Dellinger Upstream Scheduler

## 2021-04-17 ENCOUNTER — Telehealth: Payer: Self-pay | Admitting: Family Medicine

## 2021-04-17 NOTE — Chronic Care Management (AMB) (Signed)
?  Chronic Care Management  ? ?Outreach Note ? ?04/17/2021 ?Name: Lisa [name] MRN: [id number] DOB: 15-Jul-1950 ? ?Referred by: Vivi Barrack, MD ?Reason for referral : No chief complaint on file. ? ? ?Third unsuccessful telephone outreach was attempted today. The patient was referred to the pharmacist for assistance with care management and care coordination.  ? ?Follow Up Plan:  ? ?Tatjana Dellinger ?Upstream Scheduler  ?

## 2021-05-03 ENCOUNTER — Telehealth: Payer: Self-pay | Admitting: Family Medicine

## 2021-05-03 NOTE — Progress Notes (Signed)
?  Chronic Care Management  ? ?Note ? ?05/03/2021 ?Name: TAVONNA WORTHINGTON MRN: [id number] DOB: 1950-03-20 ? ?JOELINE FREER is a 71 y.o. year old female who is a primary care patient of Vivi Barrack, MD. I reached out to TYNASIA MCCAUL by phone today in response to a referral sent by [name] Blanchet's PCP, Vivi Barrack, MD.  ? ?Ms. Wojnar was given information about Chronic Care Management services today including:  ?CCM service includes personalized support from designated clinical staff supervised by her physician, including individualized plan of care and coordination with other care providers ?24/7 contact phone numbers for assistance for urgent and routine care needs. ?Service will only be billed when office clinical staff spend 20 minutes or more in a month to coordinate care. ?Only one practitioner may furnish and bill the service in a calendar month. ?The patient may stop CCM services at any time (effective at the end of the month) by phone call to the office staff. ? ? ?RUSSELL Grandstaff/SPOUSE verbally agreed to assistance and services provided by embedded care coordination/care management team today. ? ?Follow up plan: ? ? ?Tatjana Dellinger ?Upstream Scheduler  ?

## 2021-05-24 ENCOUNTER — Telehealth: Payer: Self-pay | Admitting: Pharmacist

## 2021-05-24 NOTE — Progress Notes (Signed)
? ? ?  Chronic Care Management ?Pharmacy Assistant  ? ?Name: Lisa [name]  MRN: [id number] DOB: 10/29/1950 ? ? ?Reason for Encounter: Chart Review For Initial Visit With Clinical Pharmacist ?  ?Conditions to be addressed/monitored: ?HTN, CAD, DM II, Primary Papillary carcinoma of bladder ? ?Primary concerns for visit include: ?HTN, DM II  ? ?Recent office visits:  ?11/29/2020 [facility] (PCP) Vivi Barrack, MD; no medication changes indicated. ? ?Recent consult visits:  ?02/21/2021 [facility] (Neurology) Garvin Fila, MD; no medication changes indicated. ? ?01/12/2021 [facility] (Vascular Surgeon) Ulyses Amor, PA-C; no medication changes indicated. ? ?Hospital visits:  ?None in previous 6 months ? ?Medications: ?Outpatient Encounter Medications as of 05/24/2021  ?Medication Sig  ? amLODipine (NORVASC) 5 MG tablet Take 1 tablet (5 mg total) by mouth daily.  ? aspirin EC 81 MG tablet Take 81 mg by mouth daily. Swallow whole.  ? atorvastatin (LIPITOR) 20 MG tablet Take 1 tablet (20 mg total) by mouth daily.  ? losartan (COZAAR) 50 MG tablet Take 0.5 tablets (25 mg total) by mouth every morning.  ? ?No facility-administered encounter medications on file as of 05/24/2021.  ? ?Current Medications: ?Amlodipine 5 mg last filled 04/11/2021 90 DS ?Atorvastatin 20 mg last filled 04/11/2021 90 DS ?Losartan 50 mg last filled 04/11/2021 90 DS ?Asprin 81 mg ? ?Patient Questions: ?Any changes in your medications or health? ?Patient denies any new changes in her medications or health. ? ?Any side effects from any medications?  ?Patient denies having any side effects from any of her medications. ? ?Do you have any symptoms or problems not managed by your medications? ?Patient denies having any symptoms or problems that is not currently managed by her medications. ? ?Any concerns about your health right now? ?Patent denies having any concerns with her health at this time. ? ?Has your provider asked that you check blood pressure, blood sugar, or  follow special diet at home? ?Patient does not check her blood pressure or blood pressures at home. Patient does not follow any special diet. ? ?Do you get any type of exercise on a regular basis? ?Patient goes for a walk daily as long as the weather is nice. ? ?Can you think of a goal you would like to reach for your health? ?Patient does not have a goal at this time. ? ?Do you have any problems getting your medications? ?Patient denies having any problems getting her medications. ? ?Is there anything that you would like to discuss during the appointment?  ?Patient does not have anything she would like to discuss. ? ?Please bring medications and supplements to appointment ? ?Care Gaps: ?Medicare Annual Wellness: Overdue ?Ophthalmology Exam: Overdue since 04/20/2021 ?Hemoglobin A1C: 6.2% on 11/29/2020 ?Colonoscopy: Overdue - never done ?Dexa Scan: Overdue - never done ?Mammogram: Overdue - never done ? ?Future Appointments  ?Date Time Provider [place]  ?05/30/2021 10:20 AM Vivi Barrack, MD [facility]  ?05/30/2021 11:00 AM [facility] CCM PHARMACIST [facility]  ?02/21/2022  1:00 PM Garvin Fila, MD [facility] None  ? ?Star Rating Drugs: ?Atorvastatin 20 mg last filled 04/11/2021 90 DS ?Losartan 50 mg last filled 04/11/2021 90 DS ? ?April D Calhoun, [place] ?Clinical Pharmacist Assistant ?[phone] ?

## 2021-05-25 NOTE — Progress Notes (Signed)
? ?Chronic Care Management ?Pharmacy Note ? ?05/30/2021 ?Name:  Lisa [name] MRN:  [id number] DOB:  04/20/1950 ? ?Summary: ?Initial visit with pharmD.  Visit directly after PCP visit.  Elevated BP, patient to monitor and record at home. ? ?Recommendations/Changes made from today's visit: ?FU on BP could titrate both medications if need be. ? ?Plan: ?FU 1 year with PharmD. ?BP check in 60 days ? ? ?Subjective: ?Lisa [name] is an 71 y.o. year old female who is a primary patient of Jerline Pain, Algis Greenhouse, MD.  The CCM team was consulted for assistance with disease management and care coordination needs.   ? ?Engaged with patient face to face for initial visit in response to provider referral for pharmacy case management and/or care coordination services.  ? ?Consent to Services:  ?The patient was given the following information about Chronic Care Management services today, agreed to services, and gave verbal consent: 1. CCM service includes personalized support from designated clinical staff supervised by the primary care provider, including individualized plan of care and coordination with other care providers 2. 24/7 contact phone numbers for assistance for urgent and routine care needs. 3. Service will only be billed when office clinical staff spend 20 minutes or more in a month to coordinate care. 4. Only one practitioner may furnish and bill the service in a calendar month. 5.The patient may stop CCM services at any time (effective at the end of the month) by phone call to the office staff. 6. The patient will be responsible for cost sharing (co-pay) of up to 20% of the service fee (after annual deductible is met). Patient agreed to services and consent obtained. ? ?Patient Care Team: ?Vivi Barrack, MD as PCP - General (Family Medicine) ?Donato Heinz, MD as PCP - Cardiology (Cardiology) ?Edythe Clarity, [facility] as Pharmacist (Pharmacist) ? ?Recent office visits:  ?11/29/2020 OV (PCP) Vivi Barrack, MD; no medication changes indicated. ?  ?Recent consult visits:  ?02/21/2021 OV (Neurology) Garvin Fila, MD; no medication changes indicated. ?  ?01/12/2021 OV (Vascular Surgeon) Ulyses Amor, PA-C; no medication changes indicated. ?  ?Hospital visits:  ?None in previous 6 months ? ? ?Objective: ? ?Lab Results  ?Component Value Date  ? CREATININE 0.57 05/02/2020  ? BUN 9 05/02/2020  ? GFR 94.13 01/06/2020  ? GFRNONAA >60 05/02/2020  ? GFRAA >60 10/18/2019  ? NA 134 (L) 05/02/2020  ? K 3.7 05/02/2020  ? CALCIUM 9.5 05/02/2020  ? CO2 26 05/02/2020  ? GLUCOSE 125 (H) 05/02/2020  ? ? ?Lab Results  ?Component Value Date/Time  ? HGBA1C 6.2 (A) 11/29/2020 10:10 AM  ? HGBA1C 6.0 (H) 04/29/2020 02:40 AM  ? HGBA1C 5.9 01/06/2020 11:19 AM  ? GFR 94.13 01/06/2020 11:19 AM  ?  ?Last diabetic Eye exam:  ?Lab Results  ?Component Value Date/Time  ? HMDIABEYEEXA No Retinopathy 04/20/2020 12:00 AM  ?  ?Last diabetic Foot exam: No results found for: HMDIABFOOTEX  ? ?Lab Results  ?Component Value Date  ? CHOL 127 04/29/2020  ? HDL 70 04/29/2020  ? [place] 42 04/29/2020  ? TRIG 77 04/29/2020  ? CHOLHDL 1.8 04/29/2020  ? ? ? ?  Latest Ref Rng & Units 04/28/2020  ?  8:33 PM 01/06/2020  ? 11:19 AM 10/05/2019  ?  4:41 PM  ?Hepatic Function  ?Total Protein 6.5 - 8.1 g/dL 6.5   6.3   5.8    ?Albumin 3.5 - 5.0 g/dL 3.8   3.5  2.5    ?AST 15 - 41 U/L _0 ?ALT 0 - 44 U/L _1 ?Alk Phosphatase 38 - 126 U/L 83   83   90    ?Total Bilirubin 0.3 - 1.2 mg/dL 0.8   0.4   0.6    ? ? ?No results found for: TSH, FREET4 ? ? ?  Latest Ref Rng & Units 05/02/2020  ?  4:55 AM 05/01/2020  ?  1:39 AM 04/30/2020  ?  2:36 AM  ?CBC  ?WBC 4.0 - 10.5 K/uL 6.3   7.3   6.6    ?Hemoglobin 12.0 - 15.0 g/dL 11.3   10.8   10.3    ?Hematocrit 36.0 - 46.0 % 36.0   32.8   32.5    ?Platelets 150 - 400 K/uL 189   214   183    ? ? ?No results found for: VD25OH ? ?Clinical ASCVD: Yes  ?The ASCVD Risk score (Arnett DK, et al., 2019) failed to  calculate for the following reasons: ?  The patient has a prior MI or stroke diagnosis   ? ? ?  11/29/2020  ?  9:57 AM 01/06/2020  ? 10:42 AM  ?Depression screen PHQ 2/9  ?Decreased Interest 0 0  ?Down, Depressed, Hopeless 0 0  ?PHQ - 2 Score 0 0  ?Altered sleeping  1  ?Tired, decreased energy  0  ?Change in appetite  0  ?Feeling bad or failure about yourself   0  ?Trouble concentrating  0  ?Moving slowly or fidgety/restless  1  ?Suicidal thoughts  0  ?PHQ-9 Score  2  ?Difficult doing work/chores  Somewhat difficult  ?  ? ? ?Social History  ? ?Tobacco Use  ?Smoking Status Former  ? Packs/day: 0.50  ? Types: Cigarettes  ? Quit date: 07/28/2019  ? Years since quitting: 1.8  ?Smokeless Tobacco Never  ? ?BP Readings from Last 3 Encounters:  ?05/30/21 (!) 179/87  ?02/21/21 (!) 156/89  ?01/12/21 (!) 149/86  ? ?Pulse Readings from Last 3 Encounters:  ?05/30/21 72  ?02/21/21 78  ?01/12/21 71  ? ?Wt Readings from Last 3 Encounters:  ?05/30/21 174 lb 12.8 oz (79.3 kg)  ?02/21/21 170 lb 6.4 oz (77.3 kg)  ?01/12/21 167 lb 12.8 oz (76.1 kg)  ? ?BMI Readings from Last 3 Encounters:  ?05/30/21 28.21 kg/m?  ?02/21/21 27.50 kg/m?  ?01/12/21 27.08 kg/m?  ? ? ?Assessment/Interventions: Review of patient past medical history, allergies, medications, health status, including review of consultants reports, laboratory and other test data, was performed as part of comprehensive evaluation and provision of chronic care management services.  ? ?SDOH:  (Social Determinants of Health) assessments and interventions performed: Yes ? ?Financial Resource Strain: Not on file  ? ?Food Insecurity: Not on file  ? ? ?SDOH Screenings  ? ?Alcohol Screen: Not on file  ?Depression (PHQ2-9): Low Risk   ? PHQ-2 Score: 0  ?Financial Resource Strain: Not on file  ?Food Insecurity: Not on file  ?Housing: Not on file  ?Physical Activity: Not on file  ?Social Connections: Not on file  ?Stress: Not on file  ?Tobacco Use: Medium Risk  ? Smoking Tobacco Use: Former   ? Smokeless Tobacco Use: Never  ? Passive Exposure: Not on file  ?Transportation Needs: Not on file  ? ? ?[place] ? ?No Known Allergies ? ?Medications Reviewed Today   ? ? Reviewed  by Edythe Clarity, [place] (Pharmacist) on 05/30/21 at 1140  Med List Status: <None>  ? ?Medication Order Taking? Sig Documenting Provider Last Dose Status Informant  ?amLODipine (NORVASC) 5 MG tablet [id number] Yes Take 1 tablet (5 mg total) by mouth daily. Vivi Barrack, MD Taking Active   ?aspirin EC 81 MG tablet [id number] Yes Take 81 mg by mouth daily. Swallow whole. [provider] Taking Active Spouse/Significant Other  ?         ?Med Note Anabel Bene R   Thu Jul 28, 2020  4:01 PM)    ?atorvastatin (LIPITOR) 20 MG tablet [id number] Yes Take 1 tablet (20 mg total) by mouth daily. Vivi Barrack, MD Taking Active   ?losartan (COZAAR) 50 MG tablet [id number] Yes Take 0.5 tablets (25 mg total) by mouth every morning. Vivi Barrack, MD Taking Active   ? ?  ?  ? ?  ? ? ?Patient Active Problem List  ? Diagnosis Date Noted  ? Dyslipidemia associated with type 2 diabetes mellitus ([place]) 11/29/2020  ? History of stroke x2 05/19/2020  ? Orthostatic hypotension 05/02/2020  ? Presence of IVC filter   ? DNR (do not resuscitate) discussion   ? 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 Carotid artery disease ([place]) s/p stent 2021   ? Abnormality of aortic valve   ? Diabetes mellitus without complication ([place])   ? Hypertension associated with diabetes ([place])   ? ? ?There is no immunization history for the selected administration types on file for this patient. ? ?Conditions to be addressed/monitored:  ?Hypertension, Hyperlipidemia, and Diabetes ? ?Care Plan : General Pharmacy (Adult)  ?Updates made by Edythe Clarity, RPH since 05/30/2021 12:00 AM  ?  ? ?Problem: HTN, HLD, DM   ?Priority: High  ?Onset Date: 05/30/2021  ?  ? ?Long-Range Goal: Patient-Specific Goal   ?Start Date: 05/30/2021  ?Expected End Date:  11/29/2021  ?This Visit's Progress: On track  ?Priority: High  ?Note:   ?Current Barriers:  ?Unable to achieve control of BP  ? ?Pharmacist Clinical Goal(s):  ?Patient will achieve improvement in BP  as evidence

## 2021-05-30 ENCOUNTER — Ambulatory Visit (INDEPENDENT_AMBULATORY_CARE_PROVIDER_SITE_OTHER): Payer: Medicare Other | Admitting: Family Medicine

## 2021-05-30 ENCOUNTER — Encounter: Payer: Self-pay | Admitting: Family Medicine

## 2021-05-30 ENCOUNTER — Ambulatory Visit (INDEPENDENT_AMBULATORY_CARE_PROVIDER_SITE_OTHER): Payer: Medicare Other | Admitting: Pharmacist

## 2021-05-30 VITALS — BP 179/87 | HR 72 | Temp 97.0°F | Ht 66.0 in | Wt 174.8 lb

## 2021-05-30 DIAGNOSIS — E785 Hyperlipidemia, unspecified: Secondary | ICD-10-CM

## 2021-05-30 DIAGNOSIS — E1159 Type 2 diabetes mellitus with other circulatory complications: Secondary | ICD-10-CM | POA: Diagnosis not present

## 2021-05-30 DIAGNOSIS — E119 Type 2 diabetes mellitus without complications: Secondary | ICD-10-CM

## 2021-05-30 DIAGNOSIS — C679 Malignant neoplasm of bladder, unspecified: Secondary | ICD-10-CM | POA: Diagnosis not present

## 2021-05-30 DIAGNOSIS — Z0001 Encounter for general adult medical examination with abnormal findings: Secondary | ICD-10-CM | POA: Diagnosis not present

## 2021-05-30 DIAGNOSIS — E1169 Type 2 diabetes mellitus with other specified complication: Secondary | ICD-10-CM | POA: Diagnosis not present

## 2021-05-30 DIAGNOSIS — I152 Hypertension secondary to endocrine disorders: Secondary | ICD-10-CM

## 2021-05-30 DIAGNOSIS — I10 Essential (primary) hypertension: Secondary | ICD-10-CM

## 2021-05-30 DIAGNOSIS — Z1211 Encounter for screening for malignant neoplasm of colon: Secondary | ICD-10-CM

## 2021-05-30 DIAGNOSIS — I779 Disorder of arteries and arterioles, unspecified: Secondary | ICD-10-CM

## 2021-05-30 DIAGNOSIS — E118 Type 2 diabetes mellitus with unspecified complications: Secondary | ICD-10-CM

## 2021-05-30 LAB — COMPREHENSIVE METABOLIC PANEL
ALT: 19 U/L (ref 0–35)
AST: 22 U/L (ref 0–37)
Albumin: 4.6 g/dL (ref 3.5–5.2)
Alkaline Phosphatase: 89 U/L (ref 39–117)
BUN: 16 mg/dL (ref 6–23)
CO2: 27 mEq/L (ref 19–32)
Calcium: 10.1 mg/dL (ref 8.4–10.5)
Chloride: 100 mEq/L (ref 96–112)
Creatinine, Ser: 0.69 mg/dL (ref 0.40–1.20)
GFR: 89.02 mL/min (ref >60.00)
Glucose, Bld: 115 mg/dL — ABNORMAL HIGH (ref 70–99)
Potassium: 4.3 mEq/L (ref 3.5–5.1)
Sodium: 136 mEq/L (ref 135–145)
Total Bilirubin: 0.8 mg/dL (ref 0.2–1.2)
Total Protein: 7.2 g/dL (ref 6.0–8.3)

## 2021-05-30 LAB — CBC
HCT: 43.4 % (ref 36.0–46.0)
Hemoglobin: 14.6 g/dL (ref 12.0–15.0)
MCHC: 33.6 g/dL (ref 30.0–36.0)
MCV: 94 fl (ref 78.0–100.0)
Platelets: 178 10*3/uL (ref 150.0–400.0)
RBC: 4.62 Mil/uL (ref 3.87–5.11)
RDW: 14 % (ref 11.5–15.5)
WBC: 7.5 10*3/uL (ref 4.0–10.5)

## 2021-05-30 LAB — LIPID PANEL
Cholesterol: 179 mg/dL (ref 0–200)
HDL: 92.8 mg/dL (ref >39.00)
LDL Cholesterol: 70 mg/dL (ref 0–99)
NonHDL: 86.02
Total CHOL/HDL Ratio: 2
Triglycerides: 78 mg/dL (ref 0.0–149.0)
VLDL: 15.6 mg/dL (ref 0.0–40.0)

## 2021-05-30 LAB — TSH: TSH: 1.4 u[IU]/mL (ref 0.35–5.50)

## 2021-05-30 LAB — HEMOGLOBIN A1C: Hgb A1c MFr Bld: 6.4 % (ref 4.6–6.5)

## 2021-05-30 NOTE — Assessment & Plan Note (Signed)
She is on Lipitor 10 mg daily.  We will check lipids today. ?

## 2021-05-30 NOTE — Assessment & Plan Note (Signed)
Elevated today though typically well controlled.  Discussed home monitoring goal 140/90 or lower.  Continue losartan 25 mg daily and amlodipine 5 mg daily.  Check labs today. ?

## 2021-05-30 NOTE — Assessment & Plan Note (Signed)
Continue management per vascular surgery.  She is on aspirin and statin. ?

## 2021-05-30 NOTE — Patient Instructions (Addendum)
Visit Information ? ? Goals Addressed   ? ?  ?  ?  ?  ? This Visit's Progress  ?  Track and Manage My Blood Pressure-Hypertension     ?  Timeframe:  Long-Range Goal ?Priority:  High ?Start Date:  05/30/21                           ?Expected End Date:  11/29/21                    ? ?Follow Up Date 08/29/21  ?  ?- check blood pressure weekly ?- choose a place to take my blood pressure (home, clinic or office, retail store) ?- write blood pressure results in a log or diary  ?  ?Why is this important?   ?You won't feel high blood pressure, but it can still hurt your blood vessels.  ?High blood pressure can cause heart or kidney problems. It can also cause a stroke.  ?Making lifestyle changes like losing a little weight or eating less salt will help.  ?Checking your blood pressure at home and at different times of the day can help to control blood pressure.  ?If the doctor prescribes medicine remember to take it the way the doctor ordered.  ?Call the office if you cannot afford the medicine or if there are questions about it.   ?  ?Notes:  ?  ? ?  ? ?Patient Care Plan: General Pharmacy (Adult)  ?  ? ?Problem Identified: HTN, HLD, DM   ?Priority: High  ?Onset Date: 05/30/2021  ?  ? ?Long-Range Goal: Patient-Specific Goal   ?Start Date: 05/30/2021  ?Expected End Date: 11/29/2021  ?This Visit's Progress: On track  ?Priority: High  ?Note:   ?Current Barriers:  ?Unable to achieve control of BP  ? ?Pharmacist Clinical Goal(s):  ?Patient will achieve improvement in BP  as evidenced by monitoring through collaboration with PharmD and provider.  ? ?Interventions: ?1:1 collaboration with Vivi Barrack, MD regarding development and update of comprehensive plan of care as evidenced by provider attestation and co-signature ?Inter-disciplinary care team collaboration (see longitudinal plan of care) ?Comprehensive medication review performed; medication list updated in electronic medical record ? ?Hypertension (BP goal <140/90) ?-Not  ideally controlled ?-Current treatment: ?Amlodipine '5mg'$  Appropriate, Query effective, ,  ?Losartan '25mg'$  daily Appropriate, Query effective, ,  ?-Medications previously tried: metoprolol  ?-Current home readings: no logs discussed today ?-Denies hypotensive/hypertensive symptoms ?-Educated on BP goals and benefits of medications for prevention of heart attack, stroke and kidney damage; ?Importance of home blood pressure monitoring; ?Proper BP monitoring technique; ?-Counseled to monitor BP at home a few times per week, document, and provide log at future appointments ?-Recommended to continue current medication ?Will FU closely to make sure BP has come back to normal, will have CMA check in in a few months. ? ?Hyperlipidemia: (LDL goal < 70) ?-Controlled ?-Current treatment: ?Atorvastatin '40mg'$  Appropriate, Effective, Safe, Accessible ?-Medications previously tried: none noted  ?-Educated on Cholesterol goals;  ?Benefits of statin for ASCVD risk reduction; ?Importance of limiting foods high in cholesterol; ?-Recommended to continue current medication ?LDL excellent and at goal at last labs.  Update done today at physical with PCP. ? ?Diabetes (A1c goal <6.5%) ?-Controlled ?-Current medications: ?None ?-Medications previously tried: metformin ?-Denies hypoglycemic/hyperglycemic symptoms ?-Educated on A1c and blood sugar goals; ?-Counseled to check feet daily and get yearly eye exams ?-Recommended to continue current medication ?She is due for  foot and eye exam ? ?Patient Goals/Self-Care Activities ?Patient will:  ?- check blood pressure daily, document, and provide at future appointments ? ?Follow Up Plan: The care management team will reach out to the patient again over the next 60 days.  ? ?  ? ? ?Ms. Lighty was given information about Chronic Care Management services today including:  ?CCM service includes personalized support from designated clinical staff supervised by her physician, including individualized plan  of care and coordination with other care providers ?24/7 contact phone numbers for assistance for urgent and routine care needs. ?Standard insurance, coinsurance, copays and deductibles apply for chronic care management only during months in which we provide at least 20 minutes of these services. Most insurances cover these services at 100%, however patients may be responsible for any copay, coinsurance and/or deductible if applicable. This service may help you avoid the need for more expensive face-to-face services. ?Only one practitioner may furnish and bill the service in a calendar month. ?The patient may stop CCM services at any time (effective at the end of the month) by phone call to the office staff. ? ?Patient agreed to services and verbal consent obtained.  ? ?The patient verbalized understanding of instructions, educational materials, and care plan provided today and agreed to receive a mailed copy of patient instructions, educational materials, and care plan.  ?Telephone follow up appointment with pharmacy team member scheduled for: 1  year ? ?Edythe Clarity, [facility]  ?Beverly Milch, PharmD ?Clinical Pharmacist  ?[place] ?([phone] ? ?

## 2021-05-30 NOTE — Assessment & Plan Note (Signed)
Check A1c.  Not currently on any medications. 

## 2021-05-30 NOTE — Assessment & Plan Note (Signed)
Continue management per urology. 

## 2021-05-30 NOTE — Progress Notes (Signed)
? ?Chief Complaint:  ?Lisa [name] is a 71 y.o. female who presents today for her annual comprehensive physical exam.   ? ?Assessment/Plan:  ?Chronic Problems Addressed Today: ?Dyslipidemia associated with type 2 diabetes mellitus ([place]) ?She is on Lipitor 10 mg daily.  We will check lipids today. ? 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 ? ?Carotid artery disease ([place]) s/p stent 2021 ?Continue management per vascular surgery.  She is on aspirin and statin. ? ?Diabetes mellitus without complication ([place]) ?Check A1c.  Not currently on any medications. ? ?Hypertension associated with diabetes ([place]) ?Elevated today though typically well controlled.  Discussed home monitoring goal 140/90 or lower.  Continue losartan 25 mg daily and amlodipine 5 mg daily.  Check labs today. ? ?Preventative Healthcare: ?Check labs. Schedule for cologuard. UTD on other vaccine. Declined mammogram ? ?Patient Counseling(The following topics were reviewed and/or handout was given): ? -Nutrition: Stressed importance of moderation in sodium/caffeine intake, saturated fat and cholesterol, caloric balance, sufficient intake of fresh fruits, vegetables, and fiber. ? -Stressed the importance of regular exercise.  ? -Substance Abuse: Discussed cessation/primary prevention of tobacco, alcohol, or other drug use; driving or other dangerous activities under the influence; availability of treatment for abuse.  ? -Injury prevention: Discussed safety belts, safety helmets, smoke detector, smoking near bedding or upholstery.  ? -Sexuality: Discussed sexually transmitted diseases, partner selection, use of condoms, avoidance of unintended pregnancy and contraceptive alternatives.  ? -Dental health: Discussed importance of regular tooth brushing, flossing, and dental visits. ? -Health maintenance and immunizations reviewed. Please refer to Health maintenance section. ? ?Return to care in 1 year for next  preventative visit.  ? ?  ?Subjective:  ?HPI: ? ?She has no acute complaints today.  ? ?Lifestyle ?Diet: Balanced: plenty of fruits and vegetables.  ?Exercise: Likes to walk. Everyday 40 minutes.  ? ? ?  11/29/2020  ?  9:57 AM  ?Depression screen PHQ 2/9  ?Decreased Interest 0  ?Down, Depressed, Hopeless 0  ?PHQ - 2 Score 0  ? ? ?Health Maintenance Due  ?Topic Date Due  ? FOOT EXAM  Never done  ? Hepatitis C Screening  Never done  ? Fecal DNA (Cologuard)  Never done  ? OPHTHALMOLOGY EXAM  04/20/2021  ? HEMOGLOBIN A1C  05/30/2021  ?  ? ?ROS: Per HPI, otherwise a complete review of systems was negative.  ? ?PMH: ? ?The following were reviewed and entered/updated in epic: ?Past Medical History:  ?Diagnosis Date  ? Anemia   ? Cancer [facility])   ? Carotid arterial disease ([place]) 07/2019  ? Diabetes ([place])   ? HTN (hypertension)   ? Hyperlipidemia   ? Stroke [facility])   ? acute/subacute left MCA infarct 08/12/19  ? ?Patient Active Problem List  ? Diagnosis Date Noted  ? Dyslipidemia associated with type 2 diabetes mellitus ([place]) 11/29/2020  ? History of stroke x2 05/19/2020  ? Orthostatic hypotension 05/02/2020  ? Presence of IVC filter   ? DNR (do not resuscitate) discussion   ? 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 Carotid artery disease ([place]) s/p stent 2021   ? Abnormality of aortic valve   ? Diabetes mellitus without complication ([place])   ? Hypertension associated with diabetes ([place])   ? ?Past Surgical History:  ?Procedure Laterality Date  ? CYSTOSCOPY WITH FULGERATION N/A 09/01/2019  ? Procedure: CYSTOSCOPY CLOT EVACUATION WITH POSSIBLE FULGERATION;  Surgeon: Robley Fries, MD;  Location: [place];  Service: Urology;  Laterality:  N/A;  ? [place] N/A 09/28/2019  ? Procedure: CYSTOSCOPY WITH FULGERATION AND CLOT EVACUATION;  Surgeon: Robley Fries, MD;  Location: [place];  Service: Urology;  Laterality: N/A;  ? IR IVC FILTER PLMT / S&I /IMG GUID/MOD SED  10/07/2019  ? IR IVC FILTER  RETRIEVAL / S&I /IMG GUID/MOD SED  08/01/2020  ? IR RADIOLOGIST EVAL & MGMT  03/22/2020  ? TONSILLECTOMY    ? TRANSCAROTID ARTERY REVASCULARIZATION?  Left 08/20/2019  ? Procedure: LEFT TRANSCAROTID ARTERY REVASCULARIZATION;  Surgeon: Waynetta Sandy, MD;  Location: [place];  Service: Vascular;  Laterality: Left;  ? TRANSURETHRAL RESECTION OF BLADDER TUMOR WITH MITOMYCIN-C N/A 09/22/2019  ? Procedure: TRANSURETHRAL RESECTION OF BLADDER TUMOR WITH INTRAVESICAL GEMCITABINE;  Surgeon: Robley Fries, MD;  Location: [facility];  Service: Urology;  Laterality: N/A;  90 MINS  ? ? ?Family History  ?Problem Relation Age of Onset  ? Hypertension Mother   ? Lung cancer Father   ? ? ?Medications- reviewed and updated ?Current Outpatient Medications  ?Medication Sig Dispense Refill  ? amLODipine (NORVASC) 5 MG tablet Take 1 tablet (5 mg total) by mouth daily. 90 tablet 3  ? aspirin EC 81 MG tablet Take 81 mg by mouth daily. Swallow whole.    ? atorvastatin (LIPITOR) 20 MG tablet Take 1 tablet (20 mg total) by mouth daily. 90 tablet 3  ? losartan (COZAAR) 50 MG tablet Take 0.5 tablets (25 mg total) by mouth every morning. 90 tablet 3  ? ?No current facility-administered medications for this visit.  ? ? ?Allergies-reviewed and updated ?No Known Allergies ? ?Social History  ? ?Socioeconomic History  ? Marital status: Married  ?  Spouse name: Not on file  ? Number of children: Not on file  ? Years of education: Not on file  ? Highest education level: Not on file  ?Occupational History  ? Not on file  ?Tobacco Use  ? Smoking status: Former  ?  Packs/day: 0.50  ?  Types: Cigarettes  ?  Quit date: 07/28/2019  ?  Years since quitting: 1.8  ? Smokeless tobacco: Never  ?Vaping Use  ? Vaping Use: Never used  ?Substance and Sexual Activity  ? Alcohol use: Yes  ?  Comment: occ  ? Drug use: Never  ? Sexual activity: Yes  ?Other Topics Concern  ? Not on file  ?Social History Narrative  ? Lives Independent Living with husband, Demetrius Charity in  [place].   ? Left Handed  ? Drinks no caffeine  ? ?Social Determinants of Health  ? ?Financial Resource Strain: Not on file  ?Food Insecurity: Not on file  ?Transportation Needs: Not on file  ?Physical Activity: Not on file  ?Stress: Not on file  ?Social Connections: Not on file  ? ?   ?  ?Objective:  ?Physical Exam: ?BP (!) 170/90   Pulse 76   Temp (!) 97 ?F (36.1 ?C) (Temporal)   Ht '5\' 6"'$  (1.676 m)   Wt 174 lb 12.8 oz (79.3 kg)   SpO2 98%   BMI 28.21 kg/m?   ?Body mass index is 28.21 kg/m?. ?Wt Readings from Last 3 Encounters:  ?05/30/21 174 lb 12.8 oz (79.3 kg)  ?02/21/21 170 lb 6.4 oz (77.3 kg)  ?01/12/21 167 lb 12.8 oz (76.1 kg)  ? ?Gen: NAD, resting comfortably ?HEENT: TMs normal bilaterally. OP clear. No thyromegaly noted.  ?CV: RRR with no murmurs appreciated ?Pulm: NWOB, CTAB with no crackles, wheezes, or rhonchi ?GI: Normal bowel sounds  present. Soft, Nontender, Nondistended. ?MSK: no edema, cyanosis, or clubbing noted ?Skin: warm, dry ?Neuro: CN2-12 grossly intact. Strength 5/5 in upper and lower extremities. Reflexes symmetric and intact bilaterally.  ?Psych: Normal affect and thought content ?   ? ? ?I,Savera Zaman,acting as a Education administrator for Dimas Chyle, MD.,have documented all relevant documentation on the behalf of Dimas Chyle, MD,as directed by  Dimas Chyle, MD while in the presence of Dimas Chyle, MD.  ? ?I, Dimas Chyle, MD, have reviewed all documentation for this visit. The documentation on 05/30/21 for the exam, diagnosis, procedures, and orders are all accurate and complete. ? ?Algis Greenhouse. Jerline Pain, MD ?05/30/2021 10:58 AM  ? ?

## 2021-05-30 NOTE — Patient Instructions (Signed)
It was very nice to see you today! ? ?We will check blood work today.  Keep an eye on your blood pressure and let me know if it is persistently 140/90 or higher. ? ?Take care, ?Dr Jerline Pain ? ?PLEASE NOTE: ? ?If you had any lab tests please let us know if you have not heard back within a few days. You may see your results on mychart before we have a chance to review them but we will give you a call once they are reviewed by [place]. If we ordered any referrals today, please let us know if you have not heard from their office within the next week.  ? ?Please try these tips to maintain a healthy lifestyle: ? ?Eat at least 3 REAL meals and 1-2 snacks per day.  Aim for no more than 5 hours between eating.  If you eat breakfast, please do so within one hour of getting up.  ? ?Each meal should contain half fruits/vegetables, one quarter protein, and one quarter carbs (no bigger than a computer mouse) ? ?Cut down on sweet beverages. This includes juice, soda, and sweet tea.  ? ?Drink at least 1 glass of water with each meal and aim for at least 8 glasses per day ? ?Exercise at least 150 minutes every week.   ? ?Preventive Care 50 Years and Older, Female ?Preventive care refers to lifestyle choices and visits with your health care provider that can promote health and wellness. Preventive care visits are also called wellness exams. ?What can I expect for my preventive care visit? ?Counseling ?Your health care provider may ask you questions about your: ?Medical history, including: ?Past medical problems. ?Family medical history. ?Pregnancy and menstrual history. ?History of falls. ?Current health, including: ?Memory and ability to understand (cognition). ?Emotional well-being. ?Home life and relationship well-being. ?Sexual activity and sexual health. ?Lifestyle, including: ?Alcohol, nicotine or tobacco, and drug use. ?Access to firearms. ?Diet, exercise, and sleep habits. ?Work and work Statistician. ?Sunscreen use. ?Safety issues  such as seatbelt and bike helmet use. ?Physical exam ?Your health care provider will check your: ?Height and weight. These may be used to calculate your BMI (body mass index). BMI is a measurement that tells if you are at a healthy weight. ?Waist circumference. This measures the distance around your waistline. This measurement also tells if you are at a healthy weight and may help predict your risk of certain diseases, such as type 2 diabetes and high blood pressure. ?Heart rate and blood pressure. ?Body temperature. ?Skin for abnormal spots. ?What immunizations do I need? ? ?Vaccines are usually given at various ages, according to a schedule. Your health care provider will recommend vaccines for you based on your age, medical history, and lifestyle or other factors, such as travel or where you work. ?What tests do I need? ?Screening ?Your health care provider may recommend screening tests for certain conditions. This may include: ?Lipid and cholesterol levels. ?Hepatitis C test. ?Hepatitis B test. ?HIV (human immunodeficiency virus) test. ?STI (sexually transmitted infection) testing, if you are at risk. ?Lung cancer screening. ?Colorectal cancer screening. ?Diabetes screening. This is done by checking your blood sugar (glucose) after you have not eaten for a while (fasting). ?Mammogram. Talk with your health care provider about how often you should have regular mammograms. ?BRCA-related cancer screening. This may be done if you have a family history of breast, ovarian, tubal, or peritoneal cancers. ?Bone density scan. This is done to screen for osteoporosis. ?Talk with your health care  provider about your test results, treatment options, and if necessary, the need for more tests. ?Follow these instructions at home: ?Eating and drinking ? ?Eat a diet that includes fresh fruits and vegetables, whole grains, lean protein, and low-fat dairy products. Limit your intake of foods with high amounts of sugar, saturated  fats, and salt. ?Take vitamin and mineral supplements as recommended by your health care provider. ?Do not drink alcohol if your health care provider tells you not to drink. ?If you drink alcohol: ?Limit how much you have to 0-1 drink a day. ?Know how much alcohol is in your drink. In the [place] one drink equals one 12 oz bottle of beer (355 mL), one 5 oz glass of wine (148 mL), or one 1? oz glass of hard liquor (44 mL). ?Lifestyle ?Brush your teeth every morning and night with fluoride toothpaste. Floss one time each day. ?Exercise for at least 30 minutes 5 or more days each week. ?Do not use any products that contain nicotine or tobacco. These products include cigarettes, chewing tobacco, and vaping devices, such as e-cigarettes. If you need help quitting, ask your health care provider. ?Do not use drugs. ?If you are sexually active, practice safe sex. Use a condom or other form of protection in order to prevent STIs. ?Take aspirin only as told by your health care provider. Make sure that you understand how much to take and what form to take. Work with your health care provider to find out whether it is safe and beneficial for you to take aspirin daily. ?Ask your health care provider if you need to take a cholesterol-lowering medicine (statin). ?Find healthy ways to manage stress, such as: ?Meditation, yoga, or listening to music. ?Journaling. ?Talking to a trusted person. ?Spending time with friends and family. ?Minimize exposure to UV radiation to reduce your risk of skin cancer. ?Safety ?Always wear your seat belt while driving or riding in a vehicle. ?Do not drive: ?If you have been drinking alcohol. Do not ride with someone who has been drinking. ?When you are tired or distracted. ?While texting. ?If you have been using any mind-altering substances or drugs. ?Wear a helmet and other protective equipment during sports activities. ?If you have firearms in your house, make sure you follow all gun safety  procedures. ?What's next? ?Visit your health care provider once a year for an annual wellness visit. ?Ask your health care provider how often you should have your eyes and teeth checked. ?Stay up to date on all vaccines. ?This information is not intended to replace advice given to you by your health care provider. Make sure you discuss any questions you have with your health care provider. ?Document Revised: 07/27/2020 Document Reviewed: 07/27/2020 ?Elsevier Patient Education ? [place]. ? ?

## 2021-05-31 NOTE — Progress Notes (Signed)
Please inform patient of the following: ? ?Labs are all STABLE. Would like for her to keep up the good work and we can recheck in a year. ? ?Algis Greenhouse. Jerline Pain, MD ?05/31/2021 8:02 AM  ?

## 2021-06-11 DIAGNOSIS — E785 Hyperlipidemia, unspecified: Secondary | ICD-10-CM

## 2021-06-11 DIAGNOSIS — E118 Type 2 diabetes mellitus with unspecified complications: Secondary | ICD-10-CM

## 2021-06-11 DIAGNOSIS — I10 Essential (primary) hypertension: Secondary | ICD-10-CM | POA: Diagnosis not present

## 2021-07-06 ENCOUNTER — Other Ambulatory Visit: Payer: Self-pay

## 2021-07-06 ENCOUNTER — Other Ambulatory Visit: Payer: Self-pay | Admitting: Family Medicine

## 2021-07-06 ENCOUNTER — Telehealth: Payer: Self-pay | Admitting: Family Medicine

## 2021-07-06 MED ORDER — LOSARTAN POTASSIUM 25 MG PO TABS: 25.0000 mg | ORAL_TABLET | Freq: Every morning | ORAL | 3 refills | Status: DC

## 2021-07-06 NOTE — Telephone Encounter (Signed)
Ok to send in new Rx for '25mg'$  for patient?

## 2021-07-06 NOTE — Telephone Encounter (Signed)
Ok with me. Please place any necessary orders. 

## 2021-07-06 NOTE — Telephone Encounter (Signed)
Rx sent to [facility] as requested

## 2021-07-06 NOTE — Telephone Encounter (Signed)
Lisa [name] from [facility] states they have the Losartan 25 mg back in stock and the pt wants to go back to the '25mg'$  once daily. She does not want to stay on the '50mg'$  and having to cut them in half. Please advise

## 2021-07-07 ENCOUNTER — Telehealth: Payer: Self-pay | Admitting: Family Medicine

## 2021-07-07 NOTE — Telephone Encounter (Signed)
Spoke with patient she declined AWV do not call  

## 2021-11-06 ENCOUNTER — Encounter: Payer: Self-pay | Admitting: *Deleted

## 2021-11-29 ENCOUNTER — Ambulatory Visit: Payer: Medicare Other | Admitting: Family Medicine

## 2021-12-01 ENCOUNTER — Ambulatory Visit (INDEPENDENT_AMBULATORY_CARE_PROVIDER_SITE_OTHER): Payer: Medicare Other | Admitting: Family Medicine

## 2021-12-01 ENCOUNTER — Encounter: Payer: Self-pay | Admitting: Family Medicine

## 2021-12-01 VITALS — BP 136/82 | HR 81 | Temp 97.7°F | Resp 16 | Wt 187.0 lb

## 2021-12-01 DIAGNOSIS — E119 Type 2 diabetes mellitus without complications: Secondary | ICD-10-CM

## 2021-12-01 DIAGNOSIS — E1159 Type 2 diabetes mellitus with other circulatory complications: Secondary | ICD-10-CM

## 2021-12-01 DIAGNOSIS — I152 Hypertension secondary to endocrine disorders: Secondary | ICD-10-CM

## 2021-12-01 DIAGNOSIS — E1169 Type 2 diabetes mellitus with other specified complication: Secondary | ICD-10-CM

## 2021-12-01 DIAGNOSIS — E785 Hyperlipidemia, unspecified: Secondary | ICD-10-CM

## 2021-12-01 NOTE — Patient Instructions (Signed)
It was very nice to see you today!  Your A1c looks great today.  Please keep an eye on your blood pressure and let me know if it is persistently 140/90 or higher at home.  Please come back in 6 to 12 months for your annual physical with labs.  Come back sooner if needed.  Take care, Dr Jerline Pain  PLEASE NOTE:  If you had any lab tests please let us know if you have not heard back within a few days. You may see your results on mychart before we have a chance to review them but we will give you a call once they are reviewed by [place]. If we ordered any referrals today, please let us know if you have not heard from their office within the next week.   Please try these tips to maintain a healthy lifestyle:  Eat at least 3 REAL meals and 1-2 snacks per day.  Aim for no more than 5 hours between eating.  If you eat breakfast, please do so within one hour of getting up.   Each meal should contain half fruits/vegetables, one quarter protein, and one quarter carbs (no bigger than a computer mouse)  Cut down on sweet beverages. This includes juice, soda, and sweet tea.   Drink at least 1 glass of water with each meal and aim for at least 8 glasses per day  Exercise at least 150 minutes every week.

## 2021-12-01 NOTE — Assessment & Plan Note (Signed)
Initially elevated though at goal on recheck.  We will continue losartan 25 mg daily and amlodipine 5 mg daily.

## 2021-12-01 NOTE — Assessment & Plan Note (Signed)
Doing well on Lipitor 10 mg daily.  She will come back for CPE in 6 to 12 months and we will check lipids at that point.

## 2021-12-01 NOTE — Assessment & Plan Note (Signed)
A1c well controlled without medications 6.1 today.

## 2021-12-01 NOTE — Progress Notes (Signed)
   Lisa [name] is a 71 y.o. female who presents today for an office visit.  Assessment/Plan:  Chronic Problems Addressed Today: Diabetes mellitus without complication (HCC) O3F well controlled without medications 6.1 today.   Hypertension associated with diabetes ([place]) Initially elevated though at goal on recheck.  We will continue losartan 25 mg daily and amlodipine 5 mg daily.  Dyslipidemia associated with type 2 diabetes mellitus ([place]) Doing well on Lipitor 10 mg daily.  She will come back for CPE in 6 to 12 months and we will check lipids at that point.    Subjective:  HPI:  See A/p for status of chronic conditions.       Objective:  Physical Exam: BP 136/82   Pulse 81   Temp 97.7 F (36.5 C) (Temporal)   Resp 16   Wt 187 lb (84.8 kg)   SpO2 98%   BMI 30.18 kg/m   Gen: No acute distress, resting comfortably CV: Regular rate and rhythm with no murmurs appreciated Pulm: Normal work of breathing, clear to auscultation bilaterally with no crackles, wheezes, or rhonchi Neuro: Grossly normal, moves all extremities Psych: Normal affect and thought content      Lisa [name] M. Jerline Pain, MD 12/01/2021 1:38 PM

## 2021-12-25 ENCOUNTER — Other Ambulatory Visit: Payer: Self-pay | Admitting: Family Medicine

## 2022-01-16 NOTE — Progress Notes (Signed)
[facility] Quality Team Note  Name: Lisa [name] Date of Birth: 1950/12/28 MRN: [id number] Date: 01/16/2022  [facility] Quality Team has reviewed this patient's chart, please see recommendations below:  [facility] Quality Other; ([place]. PATIENT HAS OPEN GAP FOR THIS MEASURE. PATIENT NEEDS URINE MICROALBUMIN/CREATININE RATIO COMPLETED FOR GAP CLOSURE.)

## 2022-02-21 ENCOUNTER — Ambulatory Visit: Payer: Medicare Other | Admitting: Neurology

## 2022-03-05 ENCOUNTER — Encounter: Payer: Self-pay | Admitting: Neurology

## 2022-03-05 ENCOUNTER — Ambulatory Visit: Payer: Medicare Other | Admitting: Neurology

## 2022-04-06 ENCOUNTER — Telehealth (HOSPITAL_COMMUNITY): Payer: Self-pay

## 2022-04-06 NOTE — Telephone Encounter (Signed)
During the call, Lisa [name], self reported power of attorney stated the following: The best initial contact is [phone] or [phone] Lisa [name].  Secondary contact is her self, Lisa [name] [id number].  We also discussed the inconsistency in the chart about being able to contact [place] while obtaining this information, she informed that it is OK to contact regardless of the message on the chart. As we have specific contradictory documentation to that message, I have updated the section of the chart with that information to reduce potential confusion in the future.  Per Compliance team member I should not fully remove the information from the confusing location, and instead update.   I also attempted to schedule the follow up appointment via [facility] who stated that she would need to reach out to transportation and other facets of the patient's support group to verify ability for the patient to come at the dates or times provided. I provided my direct call back number to reduce potential scheduling confusion on the patient's behalf.

## 2022-04-09 ENCOUNTER — Other Ambulatory Visit: Payer: Self-pay | Admitting: *Deleted

## 2022-04-09 DIAGNOSIS — I6523 Occlusion and stenosis of bilateral carotid arteries: Secondary | ICD-10-CM

## 2022-04-12 ENCOUNTER — Encounter (HOSPITAL_COMMUNITY): Payer: Medicare Other

## 2022-04-12 ENCOUNTER — Ambulatory Visit: Payer: Medicare Other

## 2022-05-10 ENCOUNTER — Ambulatory Visit: Payer: Medicare Other

## 2022-05-10 ENCOUNTER — Encounter (HOSPITAL_COMMUNITY): Payer: Medicare Other

## 2022-05-14 ENCOUNTER — Telehealth: Payer: Self-pay | Admitting: Family Medicine

## 2022-05-14 NOTE — Telephone Encounter (Signed)
Patient has new PHARM listed below and in chart   LAST APPOINTMENT DATE:  12/01/21  NEXT APPOINTMENT DATE: 08/27/22  MEDICATION:  losartan (COZAAR) 25 MG tablet  Is the patient out of medication? No  PHARMACY:  [facility] [place], [place] Phone: [phone]  Fax: [phone]

## 2022-05-15 ENCOUNTER — Other Ambulatory Visit: Payer: Self-pay

## 2022-05-15 ENCOUNTER — Other Ambulatory Visit: Payer: Self-pay | Admitting: *Deleted

## 2022-05-15 MED ORDER — LOSARTAN POTASSIUM 25 MG PO TABS: 25.0000 mg | ORAL_TABLET | Freq: Every morning | ORAL | 3 refills | Status: DC

## 2022-05-15 NOTE — Telephone Encounter (Signed)
Rx send to pharmacy  

## 2022-06-24 ENCOUNTER — Other Ambulatory Visit: Payer: Self-pay | Admitting: Family Medicine

## 2022-07-25 ENCOUNTER — Ambulatory Visit: Payer: Medicare Other

## 2022-07-25 ENCOUNTER — Encounter (HOSPITAL_COMMUNITY): Payer: Medicare Other

## 2022-08-07 DIAGNOSIS — R579 Shock, unspecified: Secondary | ICD-10-CM | POA: Diagnosis not present

## 2022-08-07 DIAGNOSIS — I10 Essential (primary) hypertension: Secondary | ICD-10-CM | POA: Diagnosis not present

## 2022-08-07 DIAGNOSIS — I951 Orthostatic hypotension: Secondary | ICD-10-CM | POA: Diagnosis not present

## 2022-08-07 DIAGNOSIS — I083 Combined rheumatic disorders of mitral, aortic and tricuspid valves: Secondary | ICD-10-CM | POA: Diagnosis not present

## 2022-08-07 DIAGNOSIS — R570 Cardiogenic shock: Secondary | ICD-10-CM | POA: Diagnosis not present

## 2022-08-07 DIAGNOSIS — R531 Weakness: Secondary | ICD-10-CM | POA: Diagnosis not present

## 2022-08-07 DIAGNOSIS — R6889 Other general symptoms and signs: Secondary | ICD-10-CM | POA: Diagnosis not present

## 2022-08-07 DIAGNOSIS — R509 Fever, unspecified: Secondary | ICD-10-CM | POA: Diagnosis not present

## 2022-08-07 DIAGNOSIS — K838 Other specified diseases of biliary tract: Secondary | ICD-10-CM | POA: Diagnosis not present

## 2022-08-07 DIAGNOSIS — J986 Disorders of diaphragm: Secondary | ICD-10-CM | POA: Diagnosis not present

## 2022-08-07 DIAGNOSIS — Z743 Need for continuous supervision: Secondary | ICD-10-CM | POA: Diagnosis not present

## 2022-08-07 DIAGNOSIS — R9082 White matter disease, unspecified: Secondary | ICD-10-CM | POA: Diagnosis not present

## 2022-08-07 DIAGNOSIS — I214 Non-ST elevation (NSTEMI) myocardial infarction: Secondary | ICD-10-CM | POA: Diagnosis not present

## 2022-08-07 DIAGNOSIS — I6521 Occlusion and stenosis of right carotid artery: Secondary | ICD-10-CM | POA: Diagnosis not present

## 2022-08-07 DIAGNOSIS — I5181 Takotsubo syndrome: Secondary | ICD-10-CM | POA: Diagnosis not present

## 2022-08-07 DIAGNOSIS — R569 Unspecified convulsions: Secondary | ICD-10-CM | POA: Diagnosis not present

## 2022-08-07 DIAGNOSIS — Z79899 Other long term (current) drug therapy: Secondary | ICD-10-CM | POA: Diagnosis not present

## 2022-08-07 DIAGNOSIS — I69351 Hemiplegia and hemiparesis following cerebral infarction affecting right dominant side: Secondary | ICD-10-CM | POA: Diagnosis not present

## 2022-08-07 DIAGNOSIS — I5021 Acute systolic (congestive) heart failure: Secondary | ICD-10-CM | POA: Diagnosis not present

## 2022-08-07 DIAGNOSIS — Z452 Encounter for adjustment and management of vascular access device: Secondary | ICD-10-CM | POA: Diagnosis not present

## 2022-08-07 DIAGNOSIS — G9389 Other specified disorders of brain: Secondary | ICD-10-CM | POA: Diagnosis not present

## 2022-08-07 DIAGNOSIS — R296 Repeated falls: Secondary | ICD-10-CM | POA: Diagnosis not present

## 2022-08-07 DIAGNOSIS — J9811 Atelectasis: Secondary | ICD-10-CM | POA: Diagnosis not present

## 2022-08-07 DIAGNOSIS — I5189 Other ill-defined heart diseases: Secondary | ICD-10-CM | POA: Diagnosis not present

## 2022-08-07 DIAGNOSIS — Z8673 Personal history of transient ischemic attack (TIA), and cerebral infarction without residual deficits: Secondary | ICD-10-CM | POA: Diagnosis not present

## 2022-08-07 DIAGNOSIS — I517 Cardiomegaly: Secondary | ICD-10-CM | POA: Diagnosis not present

## 2022-08-07 DIAGNOSIS — K4090 Unilateral inguinal hernia, without obstruction or gangrene, not specified as recurrent: Secondary | ICD-10-CM | POA: Diagnosis not present

## 2022-08-07 DIAGNOSIS — I2510 Atherosclerotic heart disease of native coronary artery without angina pectoris: Secondary | ICD-10-CM | POA: Diagnosis not present

## 2022-08-07 DIAGNOSIS — E872 Acidosis, unspecified: Secondary | ICD-10-CM | POA: Diagnosis not present

## 2022-08-07 DIAGNOSIS — Z8559 Personal history of malignant neoplasm of other urinary tract organ: Secondary | ICD-10-CM | POA: Diagnosis not present

## 2022-08-07 DIAGNOSIS — Z95820 Peripheral vascular angioplasty status with implants and grafts: Secondary | ICD-10-CM | POA: Diagnosis not present

## 2022-08-07 DIAGNOSIS — E785 Hyperlipidemia, unspecified: Secondary | ICD-10-CM | POA: Diagnosis not present

## 2022-08-07 DIAGNOSIS — I6523 Occlusion and stenosis of bilateral carotid arteries: Secondary | ICD-10-CM | POA: Diagnosis not present

## 2022-08-07 DIAGNOSIS — R6521 Severe sepsis with septic shock: Secondary | ICD-10-CM | POA: Diagnosis not present

## 2022-08-07 DIAGNOSIS — I499 Cardiac arrhythmia, unspecified: Secondary | ICD-10-CM | POA: Diagnosis not present

## 2022-08-07 DIAGNOSIS — D72829 Elevated white blood cell count, unspecified: Secondary | ICD-10-CM | POA: Diagnosis not present

## 2022-08-07 DIAGNOSIS — Z7982 Long term (current) use of aspirin: Secondary | ICD-10-CM | POA: Diagnosis not present

## 2022-08-07 DIAGNOSIS — A419 Sepsis, unspecified organism: Secondary | ICD-10-CM | POA: Diagnosis not present

## 2022-08-07 DIAGNOSIS — G9340 Encephalopathy, unspecified: Secondary | ICD-10-CM | POA: Diagnosis not present

## 2022-08-07 DIAGNOSIS — Z1152 Encounter for screening for COVID-19: Secondary | ICD-10-CM | POA: Diagnosis not present

## 2022-08-07 DIAGNOSIS — Z86718 Personal history of other venous thrombosis and embolism: Secondary | ICD-10-CM | POA: Diagnosis not present

## 2022-08-07 DIAGNOSIS — E119 Type 2 diabetes mellitus without complications: Secondary | ICD-10-CM | POA: Diagnosis not present

## 2022-08-07 DIAGNOSIS — R7402 Elevation of levels of lactic acid dehydrogenase (LDH): Secondary | ICD-10-CM | POA: Diagnosis not present

## 2022-08-07 DIAGNOSIS — I428 Other cardiomyopathies: Secondary | ICD-10-CM | POA: Diagnosis not present

## 2022-08-08 DIAGNOSIS — G9340 Encephalopathy, unspecified: Secondary | ICD-10-CM | POA: Diagnosis not present

## 2022-08-08 DIAGNOSIS — R509 Fever, unspecified: Secondary | ICD-10-CM | POA: Diagnosis not present

## 2022-08-08 DIAGNOSIS — R579 Shock, unspecified: Secondary | ICD-10-CM | POA: Diagnosis not present

## 2022-08-09 DIAGNOSIS — R509 Fever, unspecified: Secondary | ICD-10-CM | POA: Diagnosis not present

## 2022-08-09 DIAGNOSIS — R569 Unspecified convulsions: Secondary | ICD-10-CM | POA: Diagnosis not present

## 2022-08-09 DIAGNOSIS — G9340 Encephalopathy, unspecified: Secondary | ICD-10-CM | POA: Diagnosis not present

## 2022-08-09 DIAGNOSIS — R579 Shock, unspecified: Secondary | ICD-10-CM | POA: Diagnosis not present

## 2022-08-10 DIAGNOSIS — I428 Other cardiomyopathies: Secondary | ICD-10-CM | POA: Diagnosis not present

## 2022-08-10 DIAGNOSIS — G9340 Encephalopathy, unspecified: Secondary | ICD-10-CM | POA: Diagnosis not present

## 2022-08-10 DIAGNOSIS — R509 Fever, unspecified: Secondary | ICD-10-CM | POA: Diagnosis not present

## 2022-08-10 DIAGNOSIS — I2510 Atherosclerotic heart disease of native coronary artery without angina pectoris: Secondary | ICD-10-CM | POA: Diagnosis not present

## 2022-08-10 DIAGNOSIS — R579 Shock, unspecified: Secondary | ICD-10-CM | POA: Diagnosis not present

## 2022-08-11 DIAGNOSIS — G9340 Encephalopathy, unspecified: Secondary | ICD-10-CM | POA: Diagnosis not present

## 2022-08-11 DIAGNOSIS — R7402 Elevation of levels of lactic acid dehydrogenase (LDH): Secondary | ICD-10-CM | POA: Diagnosis not present

## 2022-08-11 DIAGNOSIS — R579 Shock, unspecified: Secondary | ICD-10-CM | POA: Diagnosis not present

## 2022-08-11 DIAGNOSIS — R509 Fever, unspecified: Secondary | ICD-10-CM | POA: Diagnosis not present

## 2022-08-12 DIAGNOSIS — R579 Shock, unspecified: Secondary | ICD-10-CM | POA: Diagnosis not present

## 2022-08-13 ENCOUNTER — Encounter (INDEPENDENT_AMBULATORY_CARE_PROVIDER_SITE_OTHER): Payer: Self-pay

## 2022-08-13 DIAGNOSIS — R579 Shock, unspecified: Secondary | ICD-10-CM | POA: Diagnosis not present

## 2022-08-14 ENCOUNTER — Telehealth: Payer: Self-pay | Admitting: Family Medicine

## 2022-08-14 ENCOUNTER — Telehealth: Payer: Self-pay

## 2022-08-14 NOTE — Telephone Encounter (Signed)
Pt has a physical appt on the 15th but went to the ED for possible seizure. Pt wants to know if she needs to be seen for ED f/u or wait until her physical. She is feeling good. Please advise.

## 2022-08-14 NOTE — Transitions of Care (Post Inpatient/ED Visit) (Signed)
08/14/2022  Name: Lisa [name] MRN: [id number] DOB: 1950-10-28  Today's TOC FU Call Status: Today's TOC FU Call Status:: Successful TOC FU Call Competed TOC FU Call Complete Date: 08/14/22  Transition Care Management Follow-up Telephone Call Date of Discharge: 08/13/22 Discharge Facility: Other ([facility]) Name of Other ([facility]) Discharge Facility: [facility] Type of Discharge: Inpatient Admission Primary Inpatient Discharge Diagnosis:: "witnessed seizure" How have you been since you were released from the hospital?: Better (Spoke w/ both pt and her caregiver/aide-states things going well. She is doing good-no issues-getting ready to go out to eat for lunch.) Any questions or concerns?: Yes Patient Questions/Concerns:: Pt and aide wanting to know about message/phone call to PCP office earlier today in regards to see if she can keep 08/27/22 appt with PCP as hospital f/u with MD as pt states she has to "pay out ot pocket" to see MD and does not want to have to make multiple visits to office Patient Questions/Concerns Addressed: Other: (Pt and aide advsied that message had been sent to provider and awaiting response. Informed that office staff would call her back with an update.)  Items Reviewed: Did you receive and understand the discharge instructions provided?: Yes Medications obtained,verified, and reconciled?: Yes (Medications Reviewed) Any new allergies since your discharge?: No Dietary orders reviewed?: Yes Type of Diet Ordered:: low salt/heart healthy/carb modified Do you have support at home?: Yes Name of Support/Comfort Primary Source: pt has aide in the home that assists her as needed  Medications Reviewed Today: Medications Reviewed Today     Reviewed by Charlyn Minerva, RN (Registered Nurse) on 08/14/22 at 1137  Med List Status: <None>   Medication Order Taking? Sig Documenting Provider Last Dose Status Informant  amLODipine (NORVASC) 5 MG tablet  [id number] No Take 1 tablet (5 mg total) by mouth daily.  Patient not taking: Reported on 08/14/2022   Ardith Dark, MD Not Taking Active   aspirin EC 81 MG tablet [id number] Yes Take 81 mg by mouth daily. Swallow whole. [provider] Taking Active Spouse/Significant Other           Med Note Gunnar Fusi, MELISSA R   Thu Jul 28, 2020  4:01 PM)    atorvastatin (LIPITOR) 20 MG tablet [id number] Yes Take 1 tablet (20 mg total) by mouth daily. Ardith Dark, MD Taking Active   furosemide (LASIX) 20 MG tablet [id number] Yes Take 20 mg by mouth. [provider] Taking Active Self  levETIRAcetam (KEPPRA) 500 MG tablet [id number] Yes Take 500 mg by mouth 2 (two) times daily. [provider] Taking Active Self  losartan (COZAAR) 25 MG tablet [id number] Yes TAKE ONE TABLET BY MOUTH EVERY MORNING  Patient taking differently: Take 25 mg by mouth every morning. Changed at hospital discharge to half a tablet (12.5mg  total) daily   Ardith Dark, MD Taking Active Self  metoprolol succinate (TOPROL-XL) 25 MG 24 hr tablet [id number] Yes Take 25 mg by mouth daily. [provider] Taking Active Self            Home Care and Equipment/Supplies: Were Home Health Services Ordered?: NA Any new equipment or medical supplies ordered?: NA  Functional Questionnaire: Do you need assistance with bathing/showering or dressing?: Yes Do you need assistance with meal preparation?: Yes Do you need assistance with eating?: No Do you have difficulty maintaining continence: No Do you need assistance with getting out of bed/getting out of a chair/moving?: No Do you have difficulty managing or taking your  medications?: Yes (aide states she fills med planner for pt)  Follow up appointments reviewed: PCP Follow-up appointment confirmed?: Yes Date of PCP follow-up appointment?: 08/27/22 (Awaiting MD approval to keep this appt) Follow-up Provider: Dr. Jimmey Ralph Specialist [facility] Follow-up  appointment confirmed?: No Reason Specialist Follow-Up Not Confirmed: Patient has Specialist Provider Number and will Call for Appointment (Aide states they received neuro contact info with [facility] to call & make f/u appt-pt wishes to first discuss this with PCP before calling & making an appt, also reviewed with pt/aide d/c instructions advised to f/u with cardiology-they will call office) Do you need transportation to your follow-up appointment?: No (aide confirms that she drives pt to appts) Do you understand care options if your condition(s) worsen?: Yes-patient verbalized understanding  SDOH Interventions Today    Flowsheet Row Most Recent Value  SDOH Interventions   Food Insecurity Interventions Intervention Not Indicated  Transportation Interventions Intervention Not Indicated      TOC Interventions Today    Flowsheet Row Most Recent Value  TOC Interventions   TOC Interventions Discussed/Reviewed TOC Interventions Discussed      Interventions Today    Flowsheet Row Most Recent Value  General Interventions   General Interventions Discussed/Reviewed General Interventions Discussed, Doctor Visits, Durable Medical Equipment (DME)  Doctor Visits Discussed/Reviewed Doctor Visits Discussed, PCP, Specialist  Durable Medical Equipment (DME) BP Cuff  [encouraged pt/aide to obtain BP monitor and begin checking BP in the home-they will contact [employer] to see if pt eligible to get one through insurance provider]  PCP/Specialist Visits Compliance with follow-up visit  Education Interventions   Education Provided Provided Education  Provided Verbal Education On Nutrition, When to see the doctor, Medication  Nutrition Interventions   Nutrition Discussed/Reviewed Nutrition Discussed, Adding fruits and vegetables, Decreasing salt, Fluid intake, Decreasing sugar intake, Decreasing fats, Increasing proteins  Pharmacy Interventions   Pharmacy Dicussed/Reviewed Pharmacy Topics Discussed, Medications  and their functions  Safety Interventions   Safety Discussed/Reviewed Safety Discussed, Home Safety       Alessandra Grout [facility] Care Management Care Management Community Coordinator Direct Phone: [phone] Toll Free: [phone] Fax: [phone]

## 2022-08-15 NOTE — Telephone Encounter (Signed)
Ok to wait till CPE visit

## 2022-08-27 ENCOUNTER — Encounter: Payer: Self-pay | Admitting: Family Medicine

## 2022-08-27 ENCOUNTER — Ambulatory Visit: Payer: Medicare Other | Admitting: Family Medicine

## 2022-08-27 VITALS — BP 131/71 | HR 67 | Temp 96.8°F | Ht 66.0 in | Wt 176.0 lb

## 2022-08-27 DIAGNOSIS — Z0001 Encounter for general adult medical examination with abnormal findings: Secondary | ICD-10-CM

## 2022-08-27 DIAGNOSIS — R569 Unspecified convulsions: Secondary | ICD-10-CM | POA: Diagnosis not present

## 2022-08-27 DIAGNOSIS — E785 Hyperlipidemia, unspecified: Secondary | ICD-10-CM

## 2022-08-27 DIAGNOSIS — I5020 Unspecified systolic (congestive) heart failure: Secondary | ICD-10-CM | POA: Diagnosis not present

## 2022-08-27 DIAGNOSIS — E1169 Type 2 diabetes mellitus with other specified complication: Secondary | ICD-10-CM | POA: Diagnosis not present

## 2022-08-27 DIAGNOSIS — E1159 Type 2 diabetes mellitus with other circulatory complications: Secondary | ICD-10-CM

## 2022-08-27 DIAGNOSIS — I152 Hypertension secondary to endocrine disorders: Secondary | ICD-10-CM

## 2022-08-27 DIAGNOSIS — E119 Type 2 diabetes mellitus without complications: Secondary | ICD-10-CM

## 2022-08-27 MED ORDER — LOSARTAN POTASSIUM 25 MG PO TABS: 25.0000 mg | ORAL_TABLET | Freq: Every morning | ORAL | 3 refills | Status: DC

## 2022-08-27 MED ORDER — LEVETIRACETAM 500 MG PO TABS: 500.0000 mg | ORAL_TABLET | Freq: Two times a day (BID) | ORAL | 0 refills | Status: DC

## 2022-08-27 MED ORDER — FUROSEMIDE 20 MG PO TABS: 20.0000 mg | ORAL_TABLET | Freq: Every day | ORAL | 2 refills | Status: DC

## 2022-08-27 MED ORDER — ATORVASTATIN CALCIUM 20 MG PO TABS: 20.0000 mg | ORAL_TABLET | Freq: Every day | ORAL | 3 refills | Status: DC

## 2022-08-27 MED ORDER — METOPROLOL SUCCINATE ER 25 MG PO TB24: 25.0000 mg | ORAL_TABLET | Freq: Every day | ORAL | 3 refills | Status: DC

## 2022-08-27 NOTE — Assessment & Plan Note (Signed)
No recurrence since being discharged.  She is currently on Keppra 500 mg twice daily.  She will follow-up with neurology as previously planned.  Will place referral today.

## 2022-08-27 NOTE — Progress Notes (Signed)
Lisa [name] is a 72 y.o. female who presents today for an office visit.  Assessment/Plan:  Chronic Problems Addressed Today: Seizure-like activity (HCC) No recurrence since being discharged.  She is currently on Keppra 500 mg twice daily.  She will follow-up with neurology as previously planned.  Will place referral today.  HFrEF (heart failure with reduced ejection fraction) (HCC) Follows with cardiology.  Euvolemic today.  Will refill medications that was started at the hospital including metoprolol succinate 25 mg daily, losartan 25 mg daily, and Lasix 20 mg daily.  Advised her to follow-up with cardiology soon as planned at discharge.  She declined checking labs today.  Diabetes mellitus without complication (HCC) Last A1c well-controlled.  She declined checking labs today.  She will come back in a couple months to have this done.  Hypertension associated with diabetes (HCC) Blood pressure at goal on current regimen losartan 25 mg daily and metoprolol succinate 25 mg daily.  She has no lower on amlodipine.  Dyslipidemia associated with type 2 diabetes mellitus (HCC) Recommended checking labs today however patient inclined.  She will come back in a couple of months to have this done.     Subjective:  HPI:  Today's visit was initially scheduled his annual physical however patient declined getting labs done when she was to come back in a couple of months at this time.  She was admitted to the hospital for a week 3 weeks ago.  She was found outside unresponsive by one of her neighbors.  Seizure-like activity was noted.  911 was called by her neighbor and patient was taken to the ED via EMS.  Versed was given on transport to the ED.  Code stroke was called.  Head CT was negative.  She was initially admitted to the ICU for shock, initially thought to be potentially septic in etiology however this is quickly ruled out.  She did have an echocardiogram performed which showed an EF of  15 to 20%.  Troponin was initially elevated and she will underwent catheterization however this did not show any areas amenable to stent placement.  She was able to be weaned off pressors and was transferred to the floor on hospital day 4.  It was thought that her shock was cardiogenic in nature potentially related to her seizure-like activity. She was started on metoprolol succinate 25 mg daily, losartan 12.5 mg daily and Lasix 20 mg daily for her new onset HFrEF.  Her amlodipine was discontinued.    For her seizure-like activity, neurology was consulted.  MRI did not show any acute findings.  She was started on Keppra and discharged home to follow-up with neurology as an outpatient.  She rapidly improved while on the floor and was discharged on hospital day 7.  It was thought that her initial presentation may have been due to hypotension related to her blood pressure medications however seizure was not fully ruled out.  She was advised to not drive for 6 months and follow-up with cardiology and neurology as an outpatient for ongoing management.       Objective:  Physical Exam: BP 131/71 Comment: refused  Pulse 67   Temp (!) 96.8 F (36 C) (Temporal)   Ht 5\' 6"  (1.676 m)   Wt 176 lb (79.8 kg)   SpO2 96%   BMI 28.41 kg/m   Gen: No acute distress, resting comfortably CV: Regular rate and rhythm with no murmurs appreciated Pulm: Normal work of breathing, clear to auscultation bilaterally with  no crackles, wheezes, or rhonchi Neuro: Grossly normal, moves all extremities Psych: Normal affect and thought content  Time Spent: 40 minutes of total time was spent on the date of the encounter performing the following actions: chart review prior to seeing the patient including recent hospitalization, obtaining history, performing a medically necessary exam, counseling on the treatment plan, placing orders, and documenting in our EHR.        Lisa [name]. Jimmey Ralph, MD 08/27/2022 1:49 PM

## 2022-08-27 NOTE — Assessment & Plan Note (Signed)
Blood pressure at goal on current regimen losartan 25 mg daily and metoprolol succinate 25 mg daily.  She has no lower on amlodipine.

## 2022-08-27 NOTE — Assessment & Plan Note (Signed)
Last A1c well-controlled.  She declined checking labs today.  She will come back in a couple months to have this done.

## 2022-08-27 NOTE — Patient Instructions (Signed)
It was very nice to see you today!  I will refill your medications today.   Return in about 2 months (around 10/28/2022) for Annual Physical.   Take care, Dr Jimmey Ralph  PLEASE NOTE:  If you had any lab tests, please let us know if you have not heard back within a few days. You may see your results on mychart before we have a chance to review them but we will give you a call once they are reviewed by [place].   If we ordered any referrals today, please let us know if you have not heard from their office within the next week.   If you had any urgent prescriptions sent in today, please check with the pharmacy within an hour of our visit to make sure the prescription was transmitted appropriately.   Please try these tips to maintain a healthy lifestyle:  Eat at least 3 REAL meals and 1-2 snacks per day.  Aim for no more than 5 hours between eating.  If you eat breakfast, please do so within one hour of getting up.   Each meal should contain half fruits/vegetables, one quarter protein, and one quarter carbs (no bigger than a computer mouse)  Cut down on sweet beverages. This includes juice, soda, and sweet tea.   Drink at least 1 glass of water with each meal and aim for at least 8 glasses per day  Exercise at least 150 minutes every week.

## 2022-08-27 NOTE — Assessment & Plan Note (Signed)
Recommended checking labs today however patient inclined.  She will come back in a couple of months to have this done.

## 2022-08-27 NOTE — Assessment & Plan Note (Signed)
Follows with cardiology.  Euvolemic today.  Will refill medications that was started at the hospital including metoprolol succinate 25 mg daily, losartan 25 mg daily, and Lasix 20 mg daily.  Advised her to follow-up with cardiology soon as planned at discharge.  She declined checking labs today.

## 2022-08-29 ENCOUNTER — Other Ambulatory Visit: Payer: Self-pay | Admitting: *Deleted

## 2022-08-29 DIAGNOSIS — I6523 Occlusion and stenosis of bilateral carotid arteries: Secondary | ICD-10-CM

## 2022-09-17 ENCOUNTER — Telehealth: Payer: Self-pay | Admitting: Vascular Surgery

## 2022-09-17 NOTE — Telephone Encounter (Signed)
lvm to r/s appt due to limited slots

## 2022-09-19 ENCOUNTER — Ambulatory Visit (HOSPITAL_COMMUNITY): Payer: Medicare Other | Attending: Vascular Surgery

## 2022-09-19 ENCOUNTER — Ambulatory Visit: Payer: Medicare Other

## 2022-09-27 ENCOUNTER — Encounter (INDEPENDENT_AMBULATORY_CARE_PROVIDER_SITE_OTHER): Payer: Self-pay

## 2022-10-30 ENCOUNTER — Ambulatory Visit (INDEPENDENT_AMBULATORY_CARE_PROVIDER_SITE_OTHER): Payer: Medicare Other | Admitting: Family Medicine

## 2022-10-30 VITALS — BP 142/98 | HR 79 | Temp 98.2°F | Ht 66.0 in | Wt 175.0 lb

## 2022-10-30 DIAGNOSIS — E119 Type 2 diabetes mellitus without complications: Secondary | ICD-10-CM

## 2022-10-30 DIAGNOSIS — R569 Unspecified convulsions: Secondary | ICD-10-CM

## 2022-10-30 DIAGNOSIS — E1169 Type 2 diabetes mellitus with other specified complication: Secondary | ICD-10-CM | POA: Diagnosis not present

## 2022-10-30 DIAGNOSIS — I152 Hypertension secondary to endocrine disorders: Secondary | ICD-10-CM | POA: Diagnosis not present

## 2022-10-30 DIAGNOSIS — E785 Hyperlipidemia, unspecified: Secondary | ICD-10-CM | POA: Diagnosis not present

## 2022-10-30 DIAGNOSIS — E1159 Type 2 diabetes mellitus with other circulatory complications: Secondary | ICD-10-CM | POA: Diagnosis not present

## 2022-10-30 LAB — POCT GLYCOSYLATED HEMOGLOBIN (HGB A1C): Hemoglobin A1C: 5.8 % — AB (ref 4.0–5.6)

## 2022-10-30 NOTE — Assessment & Plan Note (Addendum)
Mildly elevated today though typically well-controlled.  Will continue current regimen losartan 25 mg daily and metoprolol succinate 25 mg daily.  She will monitor at home and let us know if persistently elevated.

## 2022-10-30 NOTE — Progress Notes (Signed)
   Lisa [name] is a 72 y.o. female who presents today for an office visit.  Assessment/Plan:  Chronic Problems Addressed Today: Diabetes mellitus without complication (HCC) A1c improved to 5.8 without meds.  She will continue to work on lifestyle modifications.  Recheck in 6 months.  Hypertension associated with diabetes (HCC) Mildly elevated today though typically well-controlled.  Will continue current regimen losartan 25 mg daily and metoprolol succinate 25 mg daily.  She will monitor at home and let us know if persistently elevated.  Seizure-like activity (HCC) No recurrence the last few months.  On Keppra 500 mg twice daily.  Tolerating well.  Follow-up with neurology as previously planned.  Preventative health care Declined cancer screening and vaccines today.  Declined checking labs.    Subjective:  HPI:  See Assessment / plan for status of chronic conditions.  Patient is here today for follow-up.  Last saw her a couple of months ago.  At that time, we had seen her as a hospitalization follow-up due to recent seizure-like activity.  Since our last visit, she has done well.  She has been consistent with Keppra.  No repeat episodes.  She has follow-up with neurology in a few weeks.       Objective:  Physical Exam: BP (!) 142/98   Pulse 79   Temp 98.2 F (36.8 C) (Oral)   Ht 5\' 6"  (1.676 m)   Wt 175 lb (79.4 kg)   SpO2 99%   BMI 28.25 kg/m   Gen: No acute distress, resting comfortably CV: Regular rate and rhythm with no murmurs appreciated Pulm: Normal work of breathing, clear to auscultation bilaterally with no crackles, wheezes, or rhonchi Neuro: Grossly normal, moves all extremities Psych: Normal affect and thought content      Lisa [name] M. Jimmey Ralph, MD 10/30/2022 11:28 AM

## 2022-10-30 NOTE — Patient Instructions (Signed)
It was very nice to see you today!  Your blood sugar is better today.  Please continue to work on diet and exercise.  No medication changes.  Please keep an eye on blood pressure and let us know if it is persistently elevated.  Return in about 6 months (around 04/29/2023) for Annual Physical.   Take care, Dr Jimmey Ralph  PLEASE NOTE:  If you had any lab tests, please let us know if you have not heard back within a few days. You may see your results on mychart before we have a chance to review them but we will give you a call once they are reviewed by [place].   If we ordered any referrals today, please let us know if you have not heard from their office within the next week.   If you had any urgent prescriptions sent in today, please check with the pharmacy within an hour of our visit to make sure the prescription was transmitted appropriately.   Please try these tips to maintain a healthy lifestyle:  Eat at least 3 REAL meals and 1-2 snacks per day.  Aim for no more than 5 hours between eating.  If you eat breakfast, please do so within one hour of getting up.   Each meal should contain half fruits/vegetables, one quarter protein, and one quarter carbs (no bigger than a computer mouse)  Cut down on sweet beverages. This includes juice, soda, and sweet tea.   Drink at least 1 glass of water with each meal and aim for at least 8 glasses per day  Exercise at least 150 minutes every week.

## 2022-10-30 NOTE — Assessment & Plan Note (Signed)
No recurrence the last few months.  On Keppra 500 mg twice daily.  Tolerating well.  Follow-up with neurology as previously planned.

## 2022-10-30 NOTE — Assessment & Plan Note (Signed)
A1c improved to 5.8 without meds.  She will continue to work on lifestyle modifications.  Recheck in 6 months.

## 2022-11-28 ENCOUNTER — Other Ambulatory Visit: Payer: Self-pay | Admitting: Family Medicine

## 2023-02-27 ENCOUNTER — Other Ambulatory Visit: Payer: Self-pay | Admitting: Family Medicine

## 2023-04-29 ENCOUNTER — Ambulatory Visit: Payer: Medicare Other | Admitting: Family Medicine

## 2023-05-24 ENCOUNTER — Other Ambulatory Visit: Payer: Self-pay | Admitting: Family Medicine

## 2023-05-25 ENCOUNTER — Other Ambulatory Visit: Payer: Self-pay | Admitting: Family Medicine

## 2023-06-03 ENCOUNTER — Other Ambulatory Visit: Payer: Self-pay | Admitting: Family Medicine

## 2023-06-03 ENCOUNTER — Ambulatory Visit: Admitting: Family Medicine

## 2023-08-21 ENCOUNTER — Other Ambulatory Visit: Payer: Self-pay | Admitting: Family Medicine

## 2023-10-25 ENCOUNTER — Encounter: Admitting: Family Medicine

## 2023-11-01 ENCOUNTER — Ambulatory Visit

## 2023-11-13 ENCOUNTER — Encounter: Payer: Self-pay | Admitting: Family Medicine

## 2023-11-13 ENCOUNTER — Ambulatory Visit (INDEPENDENT_AMBULATORY_CARE_PROVIDER_SITE_OTHER): Admitting: Family Medicine

## 2023-11-13 VITALS — BP 144/85 | HR 70 | Temp 97.2°F | Ht 66.0 in | Wt 186.6 lb

## 2023-11-13 DIAGNOSIS — I5020 Unspecified systolic (congestive) heart failure: Secondary | ICD-10-CM

## 2023-11-13 DIAGNOSIS — Z1211 Encounter for screening for malignant neoplasm of colon: Secondary | ICD-10-CM

## 2023-11-13 DIAGNOSIS — I152 Hypertension secondary to endocrine disorders: Secondary | ICD-10-CM | POA: Diagnosis not present

## 2023-11-13 DIAGNOSIS — Z1159 Encounter for screening for other viral diseases: Secondary | ICD-10-CM | POA: Diagnosis not present

## 2023-11-13 DIAGNOSIS — E1169 Type 2 diabetes mellitus with other specified complication: Secondary | ICD-10-CM

## 2023-11-13 DIAGNOSIS — Z0001 Encounter for general adult medical examination with abnormal findings: Secondary | ICD-10-CM | POA: Diagnosis not present

## 2023-11-13 DIAGNOSIS — R569 Unspecified convulsions: Secondary | ICD-10-CM | POA: Diagnosis not present

## 2023-11-13 DIAGNOSIS — E1159 Type 2 diabetes mellitus with other circulatory complications: Secondary | ICD-10-CM | POA: Diagnosis not present

## 2023-11-13 DIAGNOSIS — E119 Type 2 diabetes mellitus without complications: Secondary | ICD-10-CM | POA: Diagnosis not present

## 2023-11-13 DIAGNOSIS — E785 Hyperlipidemia, unspecified: Secondary | ICD-10-CM | POA: Diagnosis not present

## 2023-11-13 LAB — COMPREHENSIVE METABOLIC PANEL WITH GFR
ALT: 20 U/L (ref 0–35)
AST: 26 U/L (ref 0–37)
Albumin: 4.3 g/dL (ref 3.5–5.2)
Alkaline Phosphatase: 80 U/L (ref 39–117)
BUN: 16 mg/dL (ref 6–23)
CO2: 25 meq/L (ref 19–32)
Calcium: 9.8 mg/dL (ref 8.4–10.5)
Chloride: 103 meq/L (ref 96–112)
Creatinine, Ser: 0.81 mg/dL (ref 0.40–1.20)
GFR: 73.19 mL/min (ref >60.00)
Glucose, Bld: 156 mg/dL — ABNORMAL HIGH (ref 70–99)
Potassium: 4 meq/L (ref 3.5–5.1)
Sodium: 138 meq/L (ref 135–145)
Total Bilirubin: 0.5 mg/dL (ref 0.2–1.2)
Total Protein: 7.4 g/dL (ref 6.0–8.3)

## 2023-11-13 LAB — LIPID PANEL
Cholesterol: 166 mg/dL (ref 0–200)
HDL: 66.9 mg/dL (ref >39.00)
LDL Cholesterol: 73 mg/dL (ref 0–99)
NonHDL: 98.69
Total CHOL/HDL Ratio: 2
Triglycerides: 128 mg/dL (ref 0.0–149.0)
VLDL: 25.6 mg/dL (ref 0.0–40.0)

## 2023-11-13 LAB — CBC
HCT: 46.5 % — ABNORMAL HIGH (ref 36.0–46.0)
Hemoglobin: 15 g/dL (ref 12.0–15.0)
MCHC: 32.3 g/dL (ref 30.0–36.0)
MCV: 96.6 fl (ref 78.0–100.0)
Platelets: 207 K/uL (ref 150.0–400.0)
RBC: 4.81 Mil/uL (ref 3.87–5.11)
RDW: 14.4 % (ref 11.5–15.5)
WBC: 6.5 K/uL (ref 4.0–10.5)

## 2023-11-13 LAB — TSH: TSH: 1.51 u[IU]/mL (ref 0.35–5.50)

## 2023-11-13 LAB — HEMOGLOBIN A1C: Hgb A1c MFr Bld: 6.6 % — ABNORMAL HIGH (ref 4.6–6.5)

## 2023-11-13 MED ORDER — FUROSEMIDE 20 MG PO TABS: 20.0000 mg | ORAL_TABLET | Freq: Every day | ORAL | 3 refills | Status: AC

## 2023-11-13 MED ORDER — METOPROLOL SUCCINATE ER 25 MG PO TB24: 25.0000 mg | ORAL_TABLET | Freq: Every day | ORAL | 3 refills | Status: AC

## 2023-11-13 MED ORDER — LOSARTAN POTASSIUM 25 MG PO TABS: 25.0000 mg | ORAL_TABLET | Freq: Every morning | ORAL | 3 refills | Status: AC

## 2023-11-13 MED ORDER — ATORVASTATIN CALCIUM 20 MG PO TABS: 20.0000 mg | ORAL_TABLET | Freq: Every day | ORAL | 3 refills | Status: AC

## 2023-11-13 NOTE — Progress Notes (Signed)
 Chief Complaint:  Lisa [name] is a 73 y.o. female who presents today for her annual comprehensive physical exam.    Assessment/Plan:  Chronic Problems Addressed Today: Diabetes mellitus without complication (HCC) Not currently on any medications.  She is working on lifestyle modifications.  Check labs today.  Dyslipidemia associated with type 2 diabetes mellitus (HCC) We have prescribed Lipitor  20 mg daily last year.  Will refill this today and recheck labs.  Seizure-like activity [facility]) Patient reports that she has been off of her Keppra  for the last 2 years and has not had any recurrence of seizure-like activity.  She has not followed up with neurology since last year.  Will remove Keppra  from her medication list today though advised her to follow-up with neurology soon.  HFrEF (heart failure with reduced ejection fraction) (HCC) She has not seen cardiology in over a year.  Euvolemic today.  She is currently on losartan  25 mg daily, Lasix  20 mg daily, and metoprolol  succinate 25 mg daily.  Will refill this today.  Check labs.  Advised her to follow-up with cardiology soon.  Hypertension associated with diabetes (HCC) Mildly elevated today.  She has only been taking half tablet of losartan .  Recommend she take full dose 25 mg daily.  Also continue metoprolol  succinate 25 mg daily.  She will continue moderate home and let us  know if persistently elevated.  Preventative Healthcare: Check labs.  She declined vaccines.  Patient is unsure for colon cancer screening.  We did discuss the benefits of early detection.  We will order Cologuard however she stated she is not sure that she will be able to complete this.    Patient Counseling(The following topics were reviewed and/or handout was given):  -Nutrition: Stressed importance of moderation in sodium/caffeine intake, saturated fat and cholesterol, caloric balance, sufficient intake of fresh fruits, vegetables, and fiber.  -Stressed the  importance of regular exercise.   -Substance Abuse: Discussed cessation/primary prevention of tobacco, alcohol, or other drug use; driving or other dangerous activities under the influence; availability of treatment for abuse.   -Injury prevention: Discussed safety belts, safety helmets, smoke detector, smoking near bedding or upholstery.   -Sexuality: Discussed sexually transmitted diseases, partner selection, use of condoms, avoidance of unintended pregnancy and contraceptive alternatives.   -Dental health: Discussed importance of regular tooth brushing, flossing, and dental visits.  -Health maintenance and immunizations reviewed. Please refer to Health maintenance section.  Return to care in 1 year for next preventative visit.     Subjective:  HPI:  She has no acute complaints today. Patient is here today for her annual physical.  See assessment / plan for status of chronic conditions.  Discussed the use of AI scribe software for clinical note transcription with the patient, who gave verbal consent to proceed.  History of Present Illness Lisa [name] is a 73 year old female who presents for an annual physical exam.  She has no current health concerns and adheres to her medication regimen every morning without issues. She expresses a wish that her husband, who passed away last year, was still alive and has no desire for another relationship. Her [place], and she is the only one living in the area. She has someone who visits once a week to assist her with errands.  She is currently taking losartan , 12.5 mg daily, by splitting a 25 mg tablet in half. She is unsure of the necessity of this medication. She checks her blood pressure occasionally  but generally leaves it alone. She is also on metoprolol .  She has not experienced any seizures and has never taken Keppra , despite having refills available. She has not seen any specialists since her last visit.  She is  uncertain about undergoing Cologuard testing, citing no family history of colorectal issues and no personal history of problems in that regard.  She is not exercising as much as she could but tries to walk around the house daily. She attempts to maintain a diet with plenty of fruits and vegetables.     Lifestyle Diet: Balanced. Plenty of fruits and vegetables.  Exercise: None specific. Trying to stay active at home.      11/13/2023    8:25 AM  Depression screen PHQ 2/9  Decreased Interest 0  Down, Depressed, Hopeless 0  PHQ - 2 Score 0    Health Maintenance Due  Topic Date Due   Medicare Annual Wellness (AWV)  Never done   FOOT EXAM  Never done   Diabetic kidney evaluation - Urine ACR  Never done   Hepatitis C Screening  Never done   DTaP/Tdap/Td (1 - Tdap) Never done   Fecal DNA (Cologuard)  Never done   OPHTHALMOLOGY EXAM  04/20/2021   Diabetic kidney evaluation - eGFR measurement  05/31/2022   HEMOGLOBIN A1C  04/29/2023     ROS: Per HPI, otherwise a complete review of systems was negative.   PMH:  The following were reviewed and entered/updated in epic: Past Medical History:  Diagnosis Date   Anemia    Cancer (HCC)    Carotid arterial disease 07/2019   Diabetes (HCC)    HTN (hypertension)    Hyperlipidemia    Stroke [facility])    acute/subacute left MCA infarct 08/12/19   Patient Active Problem List   Diagnosis Date Noted   Seizure-like activity (HCC) 08/27/2022   HFrEF (heart failure with reduced ejection fraction) (HCC) 08/27/2022   Dyslipidemia associated with type 2 diabetes mellitus (HCC) 11/29/2020   History of stroke x2 05/19/2020   Presence of IVC filter    DNR (do not resuscitate) discussion    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   Carotid artery disease (HCC) s/p stent 2021    Abnormality of aortic valve    Diabetes mellitus without complication (HCC)    Hypertension associated with diabetes (HCC)    Past Surgical History:   Procedure Laterality Date   CYSTOSCOPY WITH FULGERATION N/A 09/01/2019   Procedure: CYSTOSCOPY CLOT EVACUATION WITH POSSIBLE FULGERATION;  Surgeon: Elisabeth Valli BIRCH, MD;  Location: [facility] OR;  Service: Urology;  Laterality: N/A;   CYSTOSCOPY WITH FULGERATION N/A 09/28/2019   Procedure: CYSTOSCOPY WITH FULGERATION AND CLOT EVACUATION;  Surgeon: Elisabeth Valli BIRCH, MD;  Location: [facility] OR;  Service: Urology;  Laterality: N/A;   IR IVC FILTER PLMT / S&I /IMG GUID/MOD SED  10/07/2019   IR IVC FILTER RETRIEVAL / S&I /IMG GUID/MOD SED  08/01/2020   IR RADIOLOGIST EVAL & MGMT  03/22/2020   TONSILLECTOMY     TRANSCAROTID ARTERY REVASCULARIZATION  Left 08/20/2019   Procedure: LEFT TRANSCAROTID ARTERY REVASCULARIZATION;  Surgeon: Sheree Penne Bruckner, MD;  Location: [facility] OR;  Service: Vascular;  Laterality: Left;   TRANSURETHRAL RESECTION OF BLADDER TUMOR WITH MITOMYCIN -C N/A 09/22/2019   Procedure: TRANSURETHRAL RESECTION OF BLADDER TUMOR WITH INTRAVESICAL GEMCITABINE ;  Surgeon: Elisabeth Valli BIRCH, MD;  Location: [facility] ORS;  Service: Urology;  Laterality: N/A;  90 MINS    Family History  Problem Relation Age of  Onset   Hypertension Mother    Lung cancer Father     Medications- reviewed and updated Current Outpatient Medications  Medication Sig Dispense Refill   aspirin  EC 81 MG tablet Take 81 mg by mouth daily. Swallow whole.     atorvastatin  (LIPITOR ) 20 MG tablet Take 1 tablet (20 mg total) by mouth daily. 90 tablet 3   furosemide  (LASIX ) 20 MG tablet Take 1 tablet (20 mg total) by mouth daily. 90 tablet 3   losartan  (COZAAR ) 25 MG tablet Take 1 tablet (25 mg total) by mouth every morning. 90 tablet 3   metoprolol  succinate (TOPROL -XL) 25 MG 24 hr tablet Take 1 tablet (25 mg total) by mouth daily. 90 tablet 3   No current facility-administered medications for this visit.    Allergies-reviewed and updated No Known Allergies  Social History   Socioeconomic History   Marital status: Married    Spouse  name: Not on file   Number of children: Not on file   Years of education: Not on file   Highest education level: Not on file  Occupational History   Not on file  Tobacco Use   Smoking status: Former    Current packs/day: 0.00    Types: Cigarettes    Quit date: 07/28/2019    Years since quitting: 4.2   Smokeless tobacco: Never  Vaping Use   Vaping status: Never Used  Substance and Sexual Activity   Alcohol use: Yes    Comment: occ   Drug use: Never   Sexual activity: Yes  Other Topics Concern   Not on file  Social History Narrative   Lives Independent Living with husband, [place] in [place].    Left Handed   Drinks no caffeine   Social Drivers of Corporate investment banker Strain: Not on file  Food Insecurity: No Food Insecurity (08/14/2022)   Hunger Vital Sign    Worried About Running Out of Food in the Last Year: Never true    Ran Out of Food in the Last Year: Never true  Transportation Needs: No Transportation Needs (08/14/2022)   PRAPARE - Administrator, Civil Service (Medical): No    Lack of Transportation (Non-Medical): No  Physical Activity: Not on file  Stress: Stress Concern Present (08/07/2022)   Received from [facility] - Occupational Stress Questionnaire    Feeling of Stress : To some extent  Social Connections: Unknown (08/07/2022)   Received from [facility]   Social Network    Social Network: Not on file        Objective:  Physical Exam: BP (!) 144/85   Pulse 70   Temp (!) 97.2 F (36.2 C) (Temporal)   Ht 5' 6 (1.676 m)   Wt 186 lb 9.6 oz (84.6 kg)   SpO2 97%   BMI 30.12 kg/m   Body mass index is 30.12 kg/m. Wt Readings from Last 3 Encounters:  11/13/23 186 lb 9.6 oz (84.6 kg)  10/30/22 175 lb (79.4 kg)  08/27/22 176 lb (79.8 kg)   Gen: NAD, resting comfortably HEENT: TMs normal bilaterally. OP clear. No thyromegaly noted.  CV: RRR with no murmurs appreciated Pulm: NWOB, CTAB  with no crackles, wheezes, or rhonchi GI: Normal bowel sounds present. Soft, Nontender, Nondistended. MSK: no edema, cyanosis, or clubbing noted Skin: warm, dry Neuro: CN2-12 grossly intact. Strength 5/5 in upper and lower extremities. Reflexes symmetric and intact bilaterally.  Psych: Normal affect and  thought content     Lisa [name] M. Kennyth, MD 11/13/2023 8:49 AM

## 2023-11-13 NOTE — Addendum Note (Signed)
 Addended by: IDA ELORA HERO on: 11/13/2023 10:20 AM   Modules accepted: Orders

## 2023-11-13 NOTE — Assessment & Plan Note (Signed)
 She has not seen cardiology in over a year.  Euvolemic today.  She is currently on losartan  25 mg daily, Lasix  20 mg daily, and metoprolol  succinate 25 mg daily.  Will refill this today.  Check labs.  Advised her to follow-up with cardiology soon.

## 2023-11-13 NOTE — Assessment & Plan Note (Signed)
 We have prescribed Lipitor  20 mg daily last year.  Will refill this today and recheck labs.

## 2023-11-13 NOTE — Assessment & Plan Note (Signed)
 Not currently on any medications.  She is working on lifestyle modifications.  Check labs today.

## 2023-11-13 NOTE — Assessment & Plan Note (Signed)
 Patient reports that she has been off of her Keppra  for the last 2 years and has not had any recurrence of seizure-like activity.  She has not followed up with neurology since last year.  Will remove Keppra  from her medication list today though advised her to follow-up with neurology soon.

## 2023-11-13 NOTE — Patient Instructions (Addendum)
 It was very nice to see you today!  VISIT SUMMARY: You came in for your annual physical exam and reported no specific health concerns. We discussed your current medications, social support, and general health maintenance, including blood pressure monitoring and colorectal cancer screening.  YOUR PLAN: HYPERTENSION ASSOCIATED WITH DIABETES: Your high blood pressure is being managed with Losartan  and Metoprolol . You expressed a desire to stop taking Losartan  and are not regularly monitoring your blood pressure at home. -Continue taking Losartan  25 mg and Metoprolol  succinate 25 mg daily as prescribed. -Start regularly monitoring your blood pressure at home.  ADULT WELLNESS VISIT: This was your routine wellness visit where we discussed your overall health, social support, and physical activity. -Continue to stay active at home and try to incorporate more regular exercise. -Maintain a balanced diet with plenty of fruits and vegetables. -Stay connected with your social support network. -Blood work has been ordered to check your overall health.  GENERAL HEALTH MAINTENANCE: We discussed colorectal cancer screening and vaccinations. -You decided to defer vaccinations at this time. -A Cologuard kit has been sent to you for colorectal cancer screening.  Return in about 1 year (around 11/12/2024) for Annual Physical.   Take care, Dr Kennyth  PLEASE NOTE:  If you had any lab tests, please let us  know if you have not heard back within a few days. You may see your results on mychart before we have a chance to review them but we will give you a call once they are reviewed by us .   If we ordered any referrals today, please let us  know if you have not heard from their office within the next week.   If you had any urgent prescriptions sent in today, please check with the pharmacy within an hour of our visit to make sure the prescription was transmitted appropriately.   Please try these tips to maintain  a healthy lifestyle:  Eat at least 3 REAL meals and 1-2 snacks per day.  Aim for no more than 5 hours between eating.  If you eat breakfast, please do so within one hour of getting up.   Each meal should contain half fruits/vegetables, one quarter protein, and one quarter carbs (no bigger than a computer mouse)  Cut down on sweet beverages. This includes juice, soda, and sweet tea.   Drink at least 1 glass of water  with each meal and aim for at least 8 glasses per day  Exercise at least 150 minutes every week.    Preventive Care 73 Years and Older, Female Preventive care refers to lifestyle choices and visits with your health care provider that can promote health and wellness. Preventive care visits are also called wellness exams. What can I expect for my preventive care visit? Counseling Your health care provider may ask you questions about your: Medical history, including: Past medical problems. Family medical history. Pregnancy and menstrual history. History of falls. Current health, including: Memory and ability to understand (cognition). Emotional well-being. Home life and relationship well-being. Sexual activity and sexual health. Lifestyle, including: Alcohol, nicotine or tobacco, and drug use. Access to firearms. Diet, exercise, and sleep habits. Work and work Astronomer. Sunscreen use. Safety issues such as seatbelt and bike helmet use. Physical exam Your health care provider will check your: Height and weight. These may be used to calculate your BMI (body mass index). BMI is a measurement that tells if you are at a healthy weight. Waist circumference. This measures the distance around your waistline. This measurement  also tells if you are at a healthy weight and may help predict your risk of certain diseases, such as type 2 diabetes and high blood pressure. Heart rate and blood pressure. Body temperature. Skin for abnormal spots. What immunizations do I  need?  Vaccines are usually given at various ages, according to a schedule. Your health care provider will recommend vaccines for you based on your age, medical history, and lifestyle or other factors, such as travel or where you work. What tests do I need? Screening Your health care provider may recommend screening tests for certain conditions. This may include: Lipid and cholesterol levels. Hepatitis C test. Hepatitis B test. HIV (human immunodeficiency virus) test. STI (sexually transmitted infection) testing, if you are at risk. Lung cancer screening. Colorectal cancer screening. Diabetes screening. This is done by checking your blood sugar (glucose) after you have not eaten for a while (fasting). Mammogram. Talk with your health care provider about how often you should have regular mammograms. BRCA-related cancer screening. This may be done if you have a family history of breast, ovarian, tubal, or peritoneal cancers. Bone density scan. This is done to screen for osteoporosis. Talk with your health care provider about your test results, treatment options, and if necessary, the need for more tests. Follow these instructions at home: Eating and drinking  Eat a diet that includes fresh fruits and vegetables, whole grains, lean protein, and low-fat dairy products. Limit your intake of foods with high amounts of sugar, saturated fats, and salt. Take vitamin and mineral supplements as recommended by your health care provider. Do not drink alcohol if your health care provider tells you not to drink. If you drink alcohol: Limit how much you have to 0-1 drink a day. Know how much alcohol is in your drink. In the [place] one drink equals one 12 oz bottle of beer (355 mL), one 5 oz glass of wine (148 mL), or one 1 oz glass of hard liquor (44 mL). Lifestyle Brush your teeth every morning and night with fluoride toothpaste. Floss one time each day. Exercise for at least 30 minutes 5 or more days  each week. Do not use any products that contain nicotine or tobacco. These products include cigarettes, chewing tobacco, and vaping devices, such as e-cigarettes. If you need help quitting, ask your health care provider. Do not use drugs. If you are sexually active, practice safe sex. Use a condom or other form of protection in order to prevent STIs. Take aspirin  only as told by your health care provider. Make sure that you understand how much to take and what form to take. Work with your health care provider to find out whether it is safe and beneficial for you to take aspirin  daily. Ask your health care provider if you need to take a cholesterol-lowering medicine (statin). Find healthy ways to manage stress, such as: Meditation, yoga, or listening to music. Journaling. Talking to a trusted person. Spending time with friends and family. Minimize exposure to UV radiation to reduce your risk of skin cancer. Safety Always wear your seat belt while driving or riding in a vehicle. Do not drive: If you have been drinking alcohol. Do not ride with someone who has been drinking. When you are tired or distracted. While texting. If you have been using any mind-altering substances or drugs. Wear a helmet and other protective equipment during sports activities. If you have firearms in your house, make sure you follow all gun safety procedures. What's next? Visit your  health care provider once a year for an annual wellness visit. Ask your health care provider how often you should have your eyes and teeth checked. Stay up to date on all vaccines. This information is not intended to replace advice given to you by your health care provider. Make sure you discuss any questions you have with your health care provider. Document Revised: 07/27/2020 Document Reviewed: 07/27/2020 Elsevier Patient Education  2024 [employer].

## 2023-11-13 NOTE — Assessment & Plan Note (Signed)
 Mildly elevated today.  She has only been taking half tablet of losartan .  Recommend she take full dose 25 mg daily.  Also continue metoprolol  succinate 25 mg daily.  She will continue moderate home and let us  know if persistently elevated.

## 2023-11-14 LAB — HEPATITIS C ANTIBODY: Hepatitis C Ab: NONREACTIVE

## 2023-11-15 ENCOUNTER — Ambulatory Visit: Payer: Self-pay | Admitting: Family Medicine

## 2023-11-15 NOTE — Progress Notes (Signed)
 Her A1c is 6.6.  This is a little higher than what her values have been in the last couple of years however she does not need to start meds for this.  She should continue to work on diet and exercise and we can recheck this again in 3 to 6 months.  All of her other labs are at goal and we can recheck in a year.

## 2023-12-09 NOTE — Progress Notes (Signed)
 Lisa [name]                                          MRN: [id number]   12/09/2023   The [facility] Quality Team Specialist reviewed this patient medical record for the purposes of chart review for care gap closure. The following were reviewed: abstraction for care gap closure-glycemic status assessment.    [facility] Quality Team

## 2023-12-09 NOTE — Progress Notes (Signed)
 Lisa [name]                                          MRN: [id number]   12/09/2023   The [facility] Quality Team Specialist reviewed this patient medical record for the purposes of chart review for care gap closure. The following were reviewed: chart review for care gap closure-breast cancer screening, colorectal cancer screening, diabetic eye exam, and kidney health evaluation for diabetes:eGFR  and uACR.    [facility] Quality Team

## 2024-01-22 NOTE — Progress Notes (Signed)
 Lisa [name]                                          MRN: [id number]   01/22/2024   The [facility] Quality Team Specialist reviewed this patient medical record for the purposes of chart review for care gap closure. The following were reviewed: chart review for care gap closure-breast cancer screening, colorectal cancer screening, diabetic eye exam, and kidney health evaluation for diabetes:eGFR  and uACR.    [facility] Quality Team

## 2024-11-20 ENCOUNTER — Encounter: Admitting: Family Medicine
